# Patient Record
Sex: Female | Born: 1935
Health system: Southern US, Community
[De-identification: ages and names within clinical notes are randomized; demographics above are authoritative.]

## PROBLEM LIST (undated history)

## (undated) DIAGNOSIS — E039 Hypothyroidism, unspecified: Secondary | ICD-10-CM

## (undated) DIAGNOSIS — K759 Inflammatory liver disease, unspecified: Secondary | ICD-10-CM

## (undated) DIAGNOSIS — F419 Anxiety disorder, unspecified: Secondary | ICD-10-CM

## (undated) DIAGNOSIS — I428 Other cardiomyopathies: Secondary | ICD-10-CM

## (undated) DIAGNOSIS — R112 Nausea with vomiting, unspecified: Secondary | ICD-10-CM

## (undated) DIAGNOSIS — I10 Essential (primary) hypertension: Secondary | ICD-10-CM

## (undated) DIAGNOSIS — D649 Anemia, unspecified: Secondary | ICD-10-CM

## (undated) DIAGNOSIS — I509 Heart failure, unspecified: Secondary | ICD-10-CM

## (undated) DIAGNOSIS — I2510 Atherosclerotic heart disease of native coronary artery without angina pectoris: Secondary | ICD-10-CM

## (undated) DIAGNOSIS — I739 Peripheral vascular disease, unspecified: Secondary | ICD-10-CM

## (undated) DIAGNOSIS — M1990 Unspecified osteoarthritis, unspecified site: Secondary | ICD-10-CM

## (undated) DIAGNOSIS — R0602 Shortness of breath: Secondary | ICD-10-CM

## (undated) DIAGNOSIS — T4145XA Adverse effect of unspecified anesthetic, initial encounter: Secondary | ICD-10-CM

## (undated) DIAGNOSIS — G709 Myoneural disorder, unspecified: Secondary | ICD-10-CM

## (undated) DIAGNOSIS — K219 Gastro-esophageal reflux disease without esophagitis: Secondary | ICD-10-CM

## (undated) DIAGNOSIS — Z9889 Other specified postprocedural states: Secondary | ICD-10-CM

## (undated) DIAGNOSIS — E119 Type 2 diabetes mellitus without complications: Secondary | ICD-10-CM

## (undated) DIAGNOSIS — L0390 Cellulitis, unspecified: Secondary | ICD-10-CM

## (undated) DIAGNOSIS — T8859XA Other complications of anesthesia, initial encounter: Secondary | ICD-10-CM

## (undated) HISTORY — PX: TONSILLECTOMY: SUR1361

## (undated) HISTORY — PX: ABDOMINAL HYSTERECTOMY: SHX81

## (undated) HISTORY — PX: CHOLECYSTECTOMY: SHX55

## (undated) HISTORY — DX: Atherosclerotic heart disease of native coronary artery without angina pectoris: I25.10

## (undated) HISTORY — PX: THYROID SURGERY: SHX805

---

## 2001-10-26 ENCOUNTER — Inpatient Hospital Stay (HOSPITAL_COMMUNITY): Admission: AD | Admit: 2001-10-26 | Discharge: 2001-10-28 | Payer: Self-pay | Admitting: Geriatric Medicine

## 2002-08-07 ENCOUNTER — Other Ambulatory Visit: Admission: RE | Admit: 2002-08-07 | Discharge: 2002-08-07 | Payer: Self-pay | Admitting: Diagnostic Radiology

## 2003-07-27 ENCOUNTER — Emergency Department (HOSPITAL_COMMUNITY): Admission: EM | Admit: 2003-07-27 | Discharge: 2003-07-27 | Payer: Self-pay | Admitting: Emergency Medicine

## 2005-01-31 ENCOUNTER — Encounter: Admission: RE | Admit: 2005-01-31 | Discharge: 2005-01-31 | Payer: Self-pay | Admitting: Internal Medicine

## 2005-02-15 ENCOUNTER — Encounter: Admission: RE | Admit: 2005-02-15 | Discharge: 2005-02-15 | Payer: Self-pay | Admitting: Internal Medicine

## 2005-04-10 ENCOUNTER — Encounter: Admission: RE | Admit: 2005-04-10 | Discharge: 2005-04-10 | Payer: Self-pay | Admitting: Internal Medicine

## 2005-04-13 ENCOUNTER — Encounter: Admission: RE | Admit: 2005-04-13 | Discharge: 2005-04-13 | Payer: Self-pay | Admitting: Internal Medicine

## 2005-04-18 ENCOUNTER — Encounter: Admission: RE | Admit: 2005-04-18 | Discharge: 2005-04-18 | Payer: Self-pay | Admitting: Internal Medicine

## 2005-10-07 ENCOUNTER — Inpatient Hospital Stay (HOSPITAL_COMMUNITY): Admission: EM | Admit: 2005-10-07 | Discharge: 2005-10-11 | Payer: Self-pay | Admitting: Emergency Medicine

## 2005-10-10 ENCOUNTER — Encounter (INDEPENDENT_AMBULATORY_CARE_PROVIDER_SITE_OTHER): Payer: Self-pay | Admitting: *Deleted

## 2006-05-16 ENCOUNTER — Encounter: Admission: RE | Admit: 2006-05-16 | Discharge: 2006-05-16 | Payer: Self-pay | Admitting: Internal Medicine

## 2006-07-27 ENCOUNTER — Encounter: Admission: RE | Admit: 2006-07-27 | Discharge: 2006-07-27 | Payer: Self-pay | Admitting: Orthopedic Surgery

## 2006-08-13 ENCOUNTER — Encounter: Admission: RE | Admit: 2006-08-13 | Discharge: 2006-08-13 | Payer: Self-pay | Admitting: *Deleted

## 2006-08-14 ENCOUNTER — Encounter: Admission: RE | Admit: 2006-08-14 | Discharge: 2006-08-14 | Payer: Self-pay | Admitting: Orthopedic Surgery

## 2006-11-05 ENCOUNTER — Ambulatory Visit: Payer: Self-pay | Admitting: Vascular Surgery

## 2006-11-05 ENCOUNTER — Encounter: Payer: Self-pay | Admitting: Vascular Surgery

## 2006-11-05 ENCOUNTER — Ambulatory Visit (HOSPITAL_COMMUNITY): Admission: RE | Admit: 2006-11-05 | Discharge: 2006-11-05 | Payer: Self-pay | Admitting: *Deleted

## 2006-12-12 ENCOUNTER — Ambulatory Visit: Payer: Self-pay | Admitting: Vascular Surgery

## 2007-05-23 ENCOUNTER — Encounter: Admission: RE | Admit: 2007-05-23 | Discharge: 2007-05-23 | Payer: Self-pay | Admitting: Orthopedic Surgery

## 2007-09-01 ENCOUNTER — Encounter: Admission: RE | Admit: 2007-09-01 | Discharge: 2007-09-01 | Payer: Self-pay | Admitting: Orthopedic Surgery

## 2007-09-25 ENCOUNTER — Ambulatory Visit: Payer: Self-pay | Admitting: Vascular Surgery

## 2007-11-06 ENCOUNTER — Ambulatory Visit: Payer: Self-pay | Admitting: Vascular Surgery

## 2007-11-13 ENCOUNTER — Ambulatory Visit: Payer: Self-pay | Admitting: Vascular Surgery

## 2007-12-04 ENCOUNTER — Ambulatory Visit: Payer: Self-pay | Admitting: Vascular Surgery

## 2007-12-11 ENCOUNTER — Ambulatory Visit: Payer: Self-pay | Admitting: Vascular Surgery

## 2008-01-22 ENCOUNTER — Ambulatory Visit: Payer: Self-pay | Admitting: Vascular Surgery

## 2009-06-22 ENCOUNTER — Encounter: Admission: RE | Admit: 2009-06-22 | Discharge: 2009-06-22 | Payer: Self-pay | Admitting: Orthopedic Surgery

## 2009-11-24 ENCOUNTER — Encounter: Admission: RE | Admit: 2009-11-24 | Discharge: 2009-11-24 | Payer: Self-pay | Admitting: Orthopedic Surgery

## 2009-12-24 ENCOUNTER — Ambulatory Visit: Payer: Self-pay | Admitting: Vascular Surgery

## 2010-11-29 NOTE — Procedures (Signed)
DUPLEX DEEP VENOUS EXAM - LOWER EXTREMITY   INDICATION:  One week status post laser ablation and stab phlebectomies  of the right lower extremity.   HISTORY:  Edema:  No.  Trauma/Surgery:  Right lower extremity laser ablation.  Pain:  Yes.  PE:  No.  Previous DVT:  No.  Anticoagulants:  No.  Other:   DUPLEX EXAM:                CFV   SFV   PopV  PTV    GSV                R  L  R  L  R  L  R   L  R  L  Thrombosis    o  o  o     o     o      +  Spontaneous   +  +  +     +     +      0  Phasic        +  +  +     +     +      0  Augmentation  +  +  +     +     +      0  Compressible  +  +  +     +     +      0  Competent   Legend:  + - yes  o - no  p - partial  D - decreased    IMPRESSION:  1. No evidence of deep venous thrombosis noted in the right lower      extremity.  2. Totally occluded right greater saphenous extending from the distal      thigh to the lateral branch of the greater saphenous vein at the      groin level.  3. Partially occluded varicose veins of the right anterolateral thigh.      _____________________________  Larina Earthly, M.D.   CH/MEDQ  D:  12/11/2007  T:  12/11/2007  Job:  [id number]

## 2010-11-29 NOTE — Assessment & Plan Note (Signed)
OFFICE VISIT   [name], [name]  DOB:  [birth year]                                       01/22/2008  WUJWJ#:[id number]   The patient presents today for followup of a bilateral staged laser  ablation of her great saphenous vein.  She does report that she does  continue to have some aching discomfort, but feels that there has been  some improvement.  She does continue to have some excoriation over her  left lateral ankle, and this appears to be related to some degree to  itching.  She was given a prescription for a refill of her Silvadene,  which has helped in the past.  I have also suggested that, if she goes  for another 1 to 2 months with no healing, she may need dermatologic  consultation to assure that this is not some other etiology causing  this.  I am pleased with her overall result, as is this patient, and she  will see [place] again on an as needed basis.   Larina Earthly, M.D.  Electronically Signed   TFE/MEDQ  D:  01/22/2008  T:  01/23/2008  Job:  [id number]

## 2010-11-29 NOTE — Consult Note (Signed)
NEW PATIENT CONSULTATION   TONGA, PROUT  DOB:  11-20-1931                                       09/25/2007  ZOXWR#:[id number]   [name] presents today for continued followup of her severe venous  hypertension, left greater than right.  She has had these venous  varicosities for many years.  She reports that these have now become  progressively uncomfortable to her and she is developing marked skin  changes around her left ankle of venous hypertension.  She does not have  any history of hemorrhage.  She does have history of prior ulceration  over these areas in her left ankle.  She does have history of  superficial thrombophlebitis and reports chronic aching and cramping in  her left leg.  She has similar symptoms in the right leg but no skin  changes and not as progressed.  She has worn graduated compression  stockings since Spring of 2008 with no relief, elevates her legs when  possible.   PAST MEDICAL HISTORY:  Significant for:  1. Type 2 diabetes.  2. Goiter.  3. Hyperlipidemia.  4. Hypertension.   She currently is being treated with a stress fracture of her left ankle  with corrective boot.  She had undergone formal duplex over her lower  extremities in our office in May of 2008.  She had reflux in her  saphenous vein bilaterally.  I reimaged her vein today with Duplex and  this does show continued gross reflux in her left great saphenous vein.  Discussed options with [name].  I do feel that she is having  increasingly severe problems because of her venous insufficiency and I  have recommended that she proceed with laser ablation of her saphenous  vein and stab phlebectomy of her tributary varicosities in the left leg.  She understands the procedure and we will proceed as soon as she has had  completion of her treatment for her left ankle stress fracture.  We  presume this will be in early April.   Larina Earthly, M.D.  Electronically Signed   TFE/MEDQ  D:  09/25/2007  T:  09/26/2007  Job:  [id number]   cc:   Dorisann Frames, M.D.  Burnard Bunting, M.D.

## 2010-11-29 NOTE — Assessment & Plan Note (Signed)
OFFICE VISIT   TACI, STERLING  DOB:  03/02/1932                                       12/11/2007  ZOXWR#:[id number]   The patient presents today for a 1-week followup of her right leg laser  ablation and stab phlebectomy.  She did well with the procedure.  She  does report some atypical soreness in her right thigh and recalls that  this is more significant than it was on her left leg when it was treated  1 month ago.  She does report a significant generalized improvement in  her overall feeling and was questioning whether this was related to  improved circulation.  I explained that there should be no physiologic  reason why she would have a better overall general feel, that we would  anticipate improved sensation in her legs only.  Despite this, she  reports that she generally feels better than she has in the past and I  am quite pleased about this.  She underwent venous duplex today and this  reveals closure of her saphenous vein, up to just below her  saphenofemoral junction, with no evidence of deep venous thrombosis.  I  am quite pleased with her initial result and we will see her again in 6  weeks for final followup.   Larina Earthly, M.D.  Electronically Signed   TFE/MEDQ  D:  12/11/2007  T:  12/12/2007  Job:  [id number]

## 2010-11-29 NOTE — Assessment & Plan Note (Signed)
OFFICE VISIT   [name], [name]  DOB:  Apr 01, 1932                                       12/24/2009  HYQMV#:[id number]   The patient presents today for evaluation of lower extremity discomfort.  She is well-known to me from a prior bilateral staged ablation of her  great saphenous vein and stab phlebectomy for multiple tributary  varicosities.  She has continued to do quite well from the standpoint of  her venous pathology with no recurrent disease.  Her main complaint now  is multiple times as much as 10 times per night being awakened with  cramping in her calves and thighs.  She has tried all the home remedies  and also quinine with no relief.  She does have some degenerative disk  disease as well.  She denies any true arterial claudication or rest pain  in her feet.  She has not had any history of tissue loss.   Review of systems is negative other than discomfort.  She does not have  any cardiac disease.  Does have history of noninsulin dependent  diabetes.   She is married.  Does not smoke or drink alcohol.  She is retired.   PHYSICAL EXAM:  A well-developed, well-nourished white female appearing  her stated age.  Blood pressure is 171/79, pulse 74, respirations 18.  Her temperature is 97.8.  She is in no acute distress.  HEENT:  Normal.  Her radial and dorsalis pedis pulses are 2+ bilaterally.  She does have  diminished posterior and tibial pulses bilaterally.  Musculoskeletal  shows no major deformities or cyanosis.  Neurologic no focal weakness,  paresthesias.  Skin without ulcers or rashes.   She underwent noninvasive vascular laboratory studies in our office.  We  reviewed this.  This reveals normal ankle arm indices bilaterally and  normal triphasic waveforms bilaterally.  I reassured the patient of  this.  I explained that I do not see any evidence of arterial or venous  pathology to explain her discomfort.  She was reassured and will see [place]  again on an as-needed basis.     Larina Earthly, M.D.  Electronically Signed   TFE/MEDQ  D:  12/24/2009  T:  12/24/2009  Job:  [id number]   cc:   G. Dorene Grebe, M.D.  Dorisann Frames, M.D.

## 2010-11-29 NOTE — Consult Note (Signed)
VASCULAR SURGERY CONSULTATION   [name], [name]  DOB:  12/16/1931                                       12/12/2006  [id number]   I saw [name] in the office today in consultation concerning her  varicose veins and chronic venous insufficiency.  She was referred by  Dr. Henderson Cloud.  This is a pleasant 77 year old woman who has had varicose  veins in both lower extremities for as long as she can remember.  She  had developed some discoloration along the medial aspect of her left leg  and given her significant varicosities, she was referred for vascular  consultation.  Of note, she has had no previous history of DVT or  phlebitis that she is aware of.  She has had no claudication, rest pain,  or nonhealing ulcers.  She does get cramps in her legs at times.  Her  only complaint has really been some pain that is associated with  standing and relieved with ambulation and some mild bilateral lower  extremity swelling.   PAST MEDICAL HISTORY:  1. Adult onset diabetes.  She does not require insulin.  2. Hypercholesterolemia.  3. She denies any history of hypertension, history of previous      myocardial infarction, history of congestive heart failure, or      history of emphysema.   FAMILY HISTORY:  There is no family history of premature cardiovascular  disease.  She is not sure if her parents had problems with varicose  veins.   SOCIAL HISTORY:  She is married and has three children.  She does not  use tobacco.   ALLERGIES:  SULFA.   MEDICATIONS:  Documented on the medical history form in her chart.   REVIEW OF SYSTEMS:  Documented on the medical history form in her chart.   PHYSICAL EXAMINATION:  VITAL SIGNS:  Blood pressure 180/96, heart rate  82.  NECK:  I did not detect any carotid bruits.  LUNGS:  Clear to auscultation bilaterally.  CARDIOVASCULAR:  She has a regular rate and rhythm.  ABDOMEN:  Soft and nontender.  VASCULAR:  She has palpable femoral  pulses and palpable pedal pulses  bilaterally.  She has significant truncal varicosities bilaterally.  She  also has significant spider veins bilaterally.  EXTREMITIES:  She has mild bilateral lower extremity swelling.   She did undergo an extensive reflux venous Duplex scan in her office  today.  This shows confidence of the deep system bilaterally.  She is  noted to have incompetence of the saphenofemoral junction bilaterally  and the saphenous veins bilaterally.  In addition, there is chronic clot  in the distal greater saphenous vein on the left side.   Currently her only symptoms are some occasional pain associated with  standing.  I have discussed her diagnosis of chronic venous  insufficiency and superficial thrombophlebitis with her.  I have  recommended that she avoid prolonged standing and she can do as much  walking as she would like as this seems to help her symptoms.  We  discussed the use of compression stockings with a mild gradient and I  have her prescription for this.  We also discussed the importance of  intermittent leg elevation.  She has no evidence of deep venous  thrombosis and at this point, I do not think she needs anticoagulation.  I  will be happy to see her back if any new problems arise.   Judeth Cornfield. Scot Dock, M.D.  Electronically Signed  CSD/MEDQ  D:  12/12/2006  T:  12/12/2006  Job:  [id number]

## 2010-11-29 NOTE — Assessment & Plan Note (Signed)
OFFICE VISIT   SEMA, STANGLER  DOB:  Nov 23, 1931                                       11/13/2007  OZHYQ#:[id number]   Here today for evaluation of followup from left leg laser ablation stab  phlebectomy.  She has done quite well since her procedure 1 week ago.  She had some cramping and soreness in her thigh the night of the  procedure and has had essentially no discomfort since then.  She is  being very compliant regarding her compression garments.  She underwent  noninvasive limited venous duplex by me today and this reveals closure  of her saphenous vein throughout the course of her thigh and wide  patency of her common femoral vein.  I am quite pleased with her initial  result.  She is having some open ulceration over her right ankle and  dorsum of her foot and we have switched her to Silvadene and she has  been given a prescription of this today.  She wished to proceed with  ablation of her right leg for similar relief and we will plan this at  her convenience.   Larina Earthly, M.D.  Electronically Signed   TFE/MEDQ  D:  11/13/2007  T:  11/14/2007  Job:  [id number]

## 2010-12-02 NOTE — Op Note (Signed)
[name], [name]                  ACCOUNT NO.:  [id number]   MEDICAL RECORD NO.:  [id number]          PATIENT TYPE:  INP   LOCATION:  5732                         FACILITY:  [facility]   PHYSICIAN:  Kathrin Penner, M.D.   DATE OF BIRTH:  1932-02-15   DATE OF PROCEDURE:  10/10/2005  DATE OF DISCHARGE:  10/11/2005                                 OPERATIVE REPORT   PREOPERATIVE DIAGNOSIS:  Biliary pancreatitis with chronic calculus  cholecystitis.   POSTOPERATIVE DIAGNOSIS:  Biliary pancreatitis with chronic calculus  cholecystitis.   PROCEDURE:  Laparoscopic cholecystectomy with intraoperative cholangiogram.   SURGEON:  Kathrin Penner, M.D.   ASSISTANT:  Leisa Lenz. Lissa Merlin, N.P.   ANESTHESIA:  General endotracheal.   INDICATION:  This patient is a 77 year old female presenting with  hyperamylasemia, hyperlipasemia, associated with severe epigastric and right  upper quadrant pain with nausea and vomiting.  She was evaluated and noted  to have cholelithiasis.  The patient also has a remote history of alcohol  use.  After her hyperamylasemia had resolved, she now comes to the operating  room for laparoscopic cholecystectomy and intraoperative cholangiogram after  the risks and potential benefits of surgery had been discussed.  All  questions were answered and consent obtained.   DESCRIPTION OF PROCEDURE:  Following the induction of satisfactory general  anesthesia, the patient was positioned supinely and the abdomen had been  routinely prepped and draped to be included in the sterile operative field.  Open laparoscopy was created at the umbilicus and insertion of a Hasson type  cannula with insufflation of the peritoneal cavity with 14 mmHg pressure.  The camera was inserted and visual exploration of the abdomen carried out.  There was some free fluid down in the pelvis.  The other pelvic organs,  however, did not appear to be abnormal.  The patient is status post total  abdominal  hysterectomy.  Liver edge is sharply smooth.  Anterior gastric  wall and duodenum was swept and appeared to be normal.  The patient had  multiple adhesions to the wall of the gallbladder from the omentum.  Neither  the small or large intestine on view appeared to be abnormal.   Under direct vision, epigastric and lateral ports were placed.  The  gallbladder was grasped and retracted cephalad and multiple adhesions taken  down from off the gallbladder wall carrying the dissection down to the  ampulla of the gallbladder.  In the ampulla, the cystic duct and artery are  then dissected free.  The cystic duct is clipped proximally and opened, and  a cystic ductal angiogram was then carried out by passing a Cooke catheter  into the abdomen and inserting into the cystic duct.  Through this, a one-  half strength Hypaque dye is injected under the fluoroscopic guidance with a  resulting cholangiogram showing prompt flow of contrast into the duodenum  and up into the hepatic radicals.  There was a small defect at the junction  of the right and left hepatic ducts which I think is probably a bubble,  however, this needs to be  further confirmed with radiologic consultant.  The  caliber of ducts were entirely normal.   The cystic duct catheter was then removed.  The cystic duct was triply  clipped and transected and the cystic artery then isolated, doubly clipped,  and transected.  The gallbladder was then dissected free from the liver bed  using electrocautery and maintaining hemostasis throughout the course of  dissection.  At the end of this dissection, the gallbladder was placed in an  endo pouch and the right upper quadrant and the liver bed were then  thoroughly irrigated with normal saline in addition to bleeding points of  the liver bed then treated with electrocautery.  Following irrigation and  aspiration, the gallbladder was retrieved through the umbilical port without  difficulty.  The  pneumoperitoneum was allowed to deflate after the trocars  were removed under direct vision.  Sponge, instruments, and sharp counts  were then verified and the incision was closed as follows.  The umbilical  wound was closed in two layers with 0-Vicryl and 4-0 Monocryl.  Epigastric  and lateral flank wound was closed with 4-0 Monocryl sutures.  All wounds  were reinforced with Steri-Strips, sterile dressing was applied, anesthetic  reversed, and patient removed from the operating room to the recovery room  in stable condition.  She tolerated the procedure well.      Kathrin Penner, M.D.  Electronically Signed     PB/MEDQ  D:  10/10/2005  T:  10/12/2005  Job:  [id number]

## 2010-12-02 NOTE — Consult Note (Signed)
[name], [name]                  ACCOUNT NO.:  [id number]   MEDICAL RECORD NO.:  [id number]          PATIENT TYPE:  INP   LOCATION:  5731                         FACILITY:  [facility]   PHYSICIAN:  Lebron Conners, M.D.   DATE OF BIRTH:  1932/05/03   DATE OF CONSULTATION:  10/08/2005  DATE OF DISCHARGE:                                   CONSULTATION   CHIEF COMPLAINT:  Pancreatitis.   HISTORY OF PRESENT ILLNESS:  Patient is a 77 year old white female with  admission to the hospital for abdominal pain and marked elevation of the  amylase and lipase consistent with acute pancreatitis.  She does not consume  alcohol.  Her abdominal ultrasound shows sludge in the gallbladder without  evidence of acute cholecystitis and there is no dilatation of the bile  ducts.  Her liver tests are normal.  She has been admitted by Kela Millin, M.D., for supportive care and we are seeing her for consideration of  cholecystectomy.  She has had intermittent right upper quadrant abdominal  pain in the past.  Her medical physician, Dr. Cheryll Cockayne, had suggested that it  might be gallstones but had never recommended cholecystectomy or evaluation  of her gallbladder.  She is comfortable at this time.   PAST MEDICAL HISTORY:  1.  She has type 2 diabetes.  2.  She also has hypertension.  She is followed by Dr. Talmage Nap.  3.  She has had a thyroidectomy for uncertain indication.  4.  She has had a hysterectomy.  5.  Tonsillectomy.   ALLERGIES:  She is allergic to SULFA DRUGS.   She does not smoke.  She rarely drinks alcoholic beverages.   MEDICATIONS:  1.  Paroxetine 20 mg daily.  2.  Altace 10 mg daily.  3.  Actos 30 mg daily.  4.  Prevacid 30 mg p.r.n. for reflux type symptoms.   REVIEW OF SYSTEMS:  Denies chest pain, shortness of breath, fevers, sweats,  chills, nausea, vomiting and diarrhea.  Otherwise unremarkable.   PHYSICAL EXAMINATION:  VITAL SIGNS:  Unremarkable as recorded by nursing  staff.   Patient is comfortable, cheerful and is in no distress.  HEENT:  Unremarkable with no enlargement of the thyroid.  Well-healed collar  type incision.  CHEST:  Clear to auscultation.  No chest wall tenderness.  CARDIOVASCULAR:  Rate and rhythm normal.  No murmur or gallop.  Good  peripheral pulses.  ABDOMEN:  There is very slight epigastric tenderness.  There is no mass.  There is no distention.  EXTREMITIES:  No edema.  NEUROLOGIC:  Mental status is normal.   IMPRESSION:  Acute pancreatitis, most likely due to biliary disease,  improving.   PLAN:  Recheck amylase and lipase and liver tests in the morning.  If there  is good further improvement in the amylase value and in the clinical  picture, we could go ahead with laparoscopic cholecystectomy.      Lebron Conners, M.D.  Electronically Signed     WB/MEDQ  D:  10/08/2005  T:  10/10/2005  Job:  [id number]  cc:   Dorisann Frames, M.D.  Fax: [phone]

## 2010-12-02 NOTE — Discharge Summary (Signed)
NAMEMADYLYNN, [name]                  ACCOUNT NO.:  [id number]   MEDICAL RECORD NO.:  [id number]          PATIENT TYPE:  INP   LOCATION:  5732                         FACILITY:  [facility]   PHYSICIAN:  Corinna L. Conley Canal, MDDATE OF BIRTH:  1931-11-17   DATE OF ADMISSION:  10/07/2005  DATE OF DISCHARGE:                                 DISCHARGE SUMMARY   Audio too short to transcribe (less than 5 seconds)      Corinna L. Conley Canal, MD     CLS/MEDQ  D:  10/11/2005  T:  10/11/2005  Job:  [id number]

## 2010-12-02 NOTE — Discharge Summary (Signed)
[place]. [facility]  Patient:    [name], [name] Visit Number: [id number] MRN: [id number]          Service Type: MED Location: [id number] Attending Physician:  Mathews Argyle Dictated by:   Adriana Reams, M.D. Admit Date:  10/26/2001 Discharge Date: 10/28/2001                             Discharge Summary  DATE OF BIRTH:  07-30-1931  DISCHARGE DIAGNOSES: 1. Gastroenteritis syndrome.    a. Possible adverse reaction to Septra.    b. Possible viral gastroenteritis. 2. Diabetes mellitus. 3. Hypertension. 4. Slight elevation of aspartate transaminase (AST) and alanine transaminase    (ALT).  DISCHARGE MEDICATIONS: 1. Cozaar 25 mg p.o. q.d. 2. Glucotrol XL 10 mg p.o. q.d.  HISTORY OF PRESENT ILLNESS:  [name] is a 77 year old, white female patient of mine who was admitted over the weekend on Saturday for complaints of nausea, dehydration and abdominal pain.  She was started on some Septra DS antibiotic medication for presumed bronchitis on April 8, and started developing symptoms shortly thereafter of nausea and ability to tolerate solid foods.  She was seen at our walk-in clinic on Saturday morning and was about to be dehydrated with some slight elevation in her BUN level and was therefore admitted for for further evaluation.  HOSPITAL COURSE:  During her hospital stay, she received IV hydration, pain control with Demerol and nausea control with Phenergan.  Within 24 hours, the majority of her symptoms had subsided.  She did not report any episodes of emesis or increasing abdominal pain during her hospital course.  On the day of discharge, her vital signs were stable and her blood pressure was slightly elevated, but she was tolerating fluids well.  She was also started on Cozaar during her hospital stay for high blood pressure.  She will be able to continue on her Glucotrol as an outpatient and we will evaluate both of these upon  her followup with me in the office.  LABORATORY DATA AND X-RAY FINDINGS:  Sodium 139, potassium 3.4, chloride 104, CO2 28, BUN 15, creatinine 0.6, glucose 176.  AST 61, ALT 49, Alk phos 47, total bilirubin 0.5.  Both amylase and lipase levels on admission were within normal limits.  FOLLOWUP:  Her follow-up appointment will be with Dr. Barney Drain at [facility] on Thursday, April 17, at which time we will recheck her liver enzymes, a BMP and assess for any further symptoms.  We also need to reassess her blood pressure monitoring medication and continue with her diabetic medications. Dictated by:   Adriana Reams, M.D. Attending Physician:  Mathews Argyle DD:  10/28/01 TD:  10/29/01 Job: [id number]

## 2010-12-02 NOTE — Discharge Summary (Signed)
NAMEBRYTTNEY, [name]                  ACCOUNT NO.:  [id number]   MEDICAL RECORD NO.:  [id number]          PATIENT TYPE:  INP   LOCATION:  5732                         FACILITY:  [facility]   PHYSICIAN:  Corinna L. Lendell Caprice, MDDATE OF BIRTH:  [birth year]   DATE OF ADMISSION:  10/07/2005  DATE OF DISCHARGE:  10/11/2005                                 DISCHARGE SUMMARY   DISCHARGE DIAGNOSES:  1.  Gallstone pancreatitis.  2.  Status post laparoscopic cholecystectomy by Dr. Lurene Shadow on October 10, 2005.  3.  Diabetes.  4.  Hypertension.  5.  Hypokalemia  6.  Status post thyroid ablation.   DISCHARGE MEDICATIONS:  Same as upon admission plus Vicodin as needed.   ACTIVITY:  No heavy lifting for 2 weeks.   She is to follow up with Dr. Lurene Shadow in 2 weeks.   CONDITION:  Stable.   DIET:  Diabetic.   CONSULTATIONS:  Dr. Lebron Conners.   PERTINENT LABORATORY DATA:  Her cardiac enzymes were negative.  Triglycerides 365, HDL 25, LDL 72, total cholesterol 045.  Lipase on  admission was 3900, at discharge 46.  Her amylase on admission was 676 and  was normal at discharge.  Her complete metabolic panel was significant for a  potassium of 3.3 and an albumin of 2.7.  CBC unremarkable.   SPECIAL STUDIES AND RADIOLOGY:  Abdominal ultrasound showed distended  gallbladder with intraluminal sludge, no biliary dilatation, mild  hepatomegaly with fatty infiltration of the liver.  EKG showed normal sinus  rhythm with LVH by voltage, left axis deviation, prolonged QT.   HISTORY AND HOSPITAL COURSE:  Ms. [name] is a pleasant 77 year old white female  patient of Dr. Madison Hickman, who presented to the emergency room with  abdominal pain.  She had nausea.  Please see H&P for complete details.  She  had normal vital signs, right upper quadrant tenderness.  She was found to  have a pancreatitis, which  was felt to be biliary in origin.  She was made n.p.o. and started on pain  medications and IV fluids as well  as antiemetics.  Surgery was consulted and  did a laparoscopic cholecystectomy.  Her symptoms improved prior to this  procedure.  At the time of discharge, she is tolerating a regular diet and  has been cleared by surgery for discharge.      Corinna L. Lendell Caprice, MD  Electronically Signed     CLS/MEDQ  D:  10/11/2005  T:  10/12/2005  Job:  [id number]   cc:   Dorisann Frames, M.D.  Fax: [phone]   Darius Bump, M.D.  Fax: [phone]   Leonie Man, M.D.  [address]  [address]  [place]  [place] [address]

## 2010-12-02 NOTE — H&P (Signed)
NAMECHASYA, HEO                  ACCOUNT NO.:  [id number]   MEDICAL RECORD NO.:  [id number]          PATIENT TYPE:  INP   LOCATION:  [id number]                         FACILITY:  [facility]   PHYSICIAN:  Jaylah Oats, M.D.DATE OF BIRTH:  01-Oct-1931   DATE OF ADMISSION:  10/07/2005  DATE OF DISCHARGE:                                HISTORY & PHYSICAL   PRIMARY CARE PHYSICIAN:  Dr. Verner Chol   CHIEF COMPLAINT:  Upper abdominal pain.   HISTORY OF PRESENT ILLNESS:  The patient is a 77 year old white female with  past medical history significant for diabetes mellitus, hypertension, and  hyperlipidemia who presents with complaints of upper abdominal pain x1 day.  She states that the pain just was all across her upper abdomen and she had  succeeded nausea but no vomiting and the pain was worsened by deep breaths  and she describes it as a tight feeling, 5/10 intensity. She denies  hematemesis, diarrhea, fevers, dysuria, hematochezia, and no melena. The  patient also denies chest pain. She admits to a nonproductive cough x1 day.   The patient was seen in the ER and her lab work revealed an elevated lipase  and abdominal ultrasound showed gallbladder sludge, no stones. She is  admitted to the [facility] service for further evaluation and  management.   PAST MEDICAL HISTORY:  As stated above.   MEDICATIONS:  1.  Actos.  2.  Altace.  3.  Paxil.  4.  Prevacid.   The patient does not know the dosages of her medications.   ALLERGIES:  SULFA.   SOCIAL HISTORY:  Occasional alcohol (she admits to drinking half a beer  prior to this presentation). Denies tobacco.   FAMILY HISTORY:  Positive for diabetes.   REVIEW OF SYSTEMS:  As per HPI. Other review of systems negative.   PHYSICAL EXAMINATION:  GENERAL:  The patient is an elderly white female,  looks younger than her stated age, in no apparent distress.  VITAL SIGNS:  Temperature is 98 with a blood pressure of 147/71, pulse  82,  respiratory rate 20, O2 saturation of 98%.  HEENT:  PERRL, EOMI, slightly dry mucous membranes. No oral exudates.  Sclerae anicteric.  NECK:  Supple, no adenopathy, no thyromegaly, no carotid bruits appreciated.  LUNGS:  Clear to auscultation bilaterally. No crackles or wheezes.  CARDIOVASCULAR:  Regular rate and rhythm. Normal S1, S2.  ABDOMEN:  Soft, bowel sounds present. She has right upper quadrant  tenderness, no rebound tenderness, no masses palpable, and no organomegaly.  EXTREMITIES:  No cyanosis or edema.  NEUROLOGIC:  Alert and oriented x3. Cranial nerves II-XII grossly intact.  Nonfocal exam.   LABORATORY DATA:  Her lipase is 3936. Sodium is 139, potassium 3.8, chloride  is 109, BUN 14, glucose of 170. Her pH is 7.38 with a pCO2 of 35, bicarb of  21.2. White cell count is 6.5 with a hemoglobin of 12.4, hematocrit of 36.1,  platelet count 271, with a neutrophil count of 16%. Creatinine is 0.5. Her  calcium is 8.2. Her LFTs are within normal limits.  ASSESSMENT AND PLAN:  1.  Pancreatitis - no significant alcohol use, gallbladder sludge noted per      ultrasound and as discussed above, the patient with right upper quadrant      tenderness on exam and markedly elevated lipase. Will obtain a      triglyceride level and follow. Will keep the patient n.p.o. IV      analgesics for pain management. IV fluids and IV Protonix. Will consult      surgery for possible cholecystectomy.  2.  Diabetes mellitus - will hold oral hypoglycemics for now, Accu-Cheks and      cover with sliding scale insulin.  3.  Hypertension - will continue Altace.  4.  History of hyperlipidemia. Since she is on no medications secondary to      intolerance, will obtain a fasting lipid profile and follow.      Shanik Oats, M.D.  Electronically Signed     ACV/MEDQ  D:  10/08/2005  T:  10/09/2005  Job:  [id number]   cc:   Leilani Merl, M.D.  Fax: [phone]   Jacelyn Pi, M.D.  Fax:  [phone]

## 2010-12-02 NOTE — H&P (Signed)
[place]  Patient:    [name], [name] Visit Number: [id number] MRN: [id number]          Service Type: MED Location: [id number] Attending Physician:  Ginette Otto Dictated by:   Hal T. Stoneking, M.D. Admit Date:  10/26/2001 Discharge Date: 10/28/2001   CC:         Lester Kinsman, M.D.   History and Physical  IDENTIFYING DATA:  The patient is a very nice 77 year old white female.  She has had diabetes and was diagnosed approximately in 1993.  She has also had a goiter and has been operated on twice in the past.  She has been hyperthyroid in the past, although has more recently been on PTU, and that has recently been discontinued.  She was in her usual state of health until four weeks ago. She developed a "cold."  She stated that she had head congestion and persistent cough.  She was given codeine about two weeks ago, and this made her too sleepy; so she stopped the medication.  Then approximately three days ago she was started on Septra, then developed dizziness, was again feeling sleepy, was nauseated, and then began having dry heaves and epigastric pain. She stopped the Septra yesterday.  The epigastric pain continues, and she has not been able to keep fluids down.  She has had a temperature as high as 101 yesterday.  Her bowel movements have been normal.  She has seen no blood in her stool.  She has had no blood in any vomitus.  She did take ibuprofen once yesterday.  She does not consume alcohol.  She went to the walk-in clinic at [facility].  She was seen by Dr. Foy Guadalajara, and due to dehydration, persistent pain, it was felt she needed hospitalization, at least for IV fluids.  PAST MEDICAL HISTORY:  Remarkable for diabetes diagnosed in 1993.  History of a goiter.  History of hyperthyroidism.  No history of heart disease, stroke, or cancer.  ALLERGIES:  She has no known drug allergies.  CURRENT MEDICATIONS:  Glucotrol XL  10 mg a day, multiple vitamin once a day.  PREVIOUS SURGERIES:  She had a total abdominal hysterectomy and BSO in 1971. She has had two thyroid surgeries in the past.  FAMILY HISTORY:  Mother, brother, and two sisters have diabetes.  She lost a son to possibly a lymphoma at age [age].  She lost her father at age 64 to an MI.  SOCIAL HISTORY:  She moved to the [place] from [place], [place] in [place].  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  She also has retired from Engineering geologist work.  She does not drink.  She does not smoke.  She has three sons, ages 59, 82, and 51.  REVIEW OF SYSTEMS:  She complains of a dull headache.  No change in vision or hearing.  No trouble with chewing.  She does complain of discomfort on swallowing, especially in the epigastric area.  She has had no dysuria, no change in bowel habits.  PHYSICAL EXAMINATION:  VITAL SIGNS:  Temperature 98.3, blood pressure 118/64, Accu-Chek 305, respiratory rate 20.  SKIN:  Warm and dry.  HEENT:  Pupils are equal, round, and reactive to light.  Disks are sharp.  TMs are normal.  Oropharynx is dry.  NECK:  Does reveal a goiter.  LUNGS:  Clear.  HEART:  Regular rate and rhythm without murmur.  ABDOMEN:  Reveals epigastric discomfort.  Her  abdomen is soft.  No masses felt.  NEUROLOGIC:  She is alert and appropriate.  LABORATORY DATA:  Laboratory available at this time [facility] shows a hemoglobin of 15.3 and a white count of 8300.  Granulocytes are slightly elevated at 80.  ASSESSMENT: 1. Sudden nausea, vomiting, and epigastric pain, likely secondary to Septra.    I also consider gastritis, peptic ulcer disease, biliary disease, or    pancreatitis. 2. Dehydration. 3. Diabetes mellitus with elevated blood sugars secondary to stress and acute    illness. 4. Recent viral upper respiratory infection.  PLAN:  We will admit.  We will begin IV fluids.  We will cover her with Regular Insulin.  We will check a  liver panel, amylase, and lipase.  If her workup is negative and she feels better in the morning, she could possibly go home. Dictated by:   Hal T. Stoneking, M.D. Attending Physician:  Ginette Otto DD:  10/26/01 TD:  10/26/01 Job: [id number]

## 2011-03-15 ENCOUNTER — Other Ambulatory Visit: Payer: Self-pay | Admitting: Family Medicine

## 2011-03-15 DIAGNOSIS — R609 Edema, unspecified: Secondary | ICD-10-CM

## 2011-03-15 DIAGNOSIS — R1900 Intra-abdominal and pelvic swelling, mass and lump, unspecified site: Secondary | ICD-10-CM

## 2011-03-16 ENCOUNTER — Ambulatory Visit
Admission: RE | Admit: 2011-03-16 | Discharge: 2011-03-16 | Disposition: A | Discharge status: Home / Self Care | Payer: Medicare Other | Source: Ambulatory Visit | Attending: Family Medicine | Admitting: Family Medicine

## 2011-03-16 DIAGNOSIS — R609 Edema, unspecified: Secondary | ICD-10-CM

## 2011-03-16 DIAGNOSIS — R1900 Intra-abdominal and pelvic swelling, mass and lump, unspecified site: Secondary | ICD-10-CM

## 2011-05-31 ENCOUNTER — Other Ambulatory Visit (HOSPITAL_COMMUNITY): Payer: Self-pay | Admitting: Gastroenterology

## 2011-05-31 DIAGNOSIS — R1110 Vomiting, unspecified: Secondary | ICD-10-CM

## 2011-06-23 ENCOUNTER — Encounter (HOSPITAL_COMMUNITY)
Admission: RE | Admit: 2011-06-23 | Discharge: 2011-06-23 | Disposition: A | Discharge status: Home / Self Care | Payer: Medicare Other | Source: Ambulatory Visit | Attending: Gastroenterology | Admitting: Gastroenterology

## 2011-06-23 DIAGNOSIS — R109 Unspecified abdominal pain: Secondary | ICD-10-CM | POA: Insufficient documentation

## 2011-06-23 DIAGNOSIS — R1110 Vomiting, unspecified: Secondary | ICD-10-CM | POA: Insufficient documentation

## 2011-06-23 MED ORDER — TECHNETIUM TC 99M SULFUR COLLOID: 2.1000 | Freq: Once | INTRAVENOUS | Status: AC | PRN

## 2011-08-09 HISTORY — PX: CORONARY ANGIOPLASTY WITH STENT PLACEMENT: SHX49

## 2011-09-06 ENCOUNTER — Other Ambulatory Visit (HOSPITAL_COMMUNITY): Payer: Self-pay | Admitting: Gastroenterology

## 2011-09-06 ENCOUNTER — Ambulatory Visit (HOSPITAL_COMMUNITY)
Admission: RE | Admit: 2011-09-06 | Discharge: 2011-09-06 | Disposition: A | Discharge status: Home / Self Care | Payer: Medicare Other | Source: Ambulatory Visit | Attending: Gastroenterology | Admitting: Gastroenterology

## 2011-09-06 ENCOUNTER — Other Ambulatory Visit: Payer: Self-pay | Admitting: Gastroenterology

## 2011-09-06 DIAGNOSIS — Q438 Other specified congenital malformations of intestine: Secondary | ICD-10-CM | POA: Insufficient documentation

## 2011-09-06 DIAGNOSIS — K5730 Diverticulosis of large intestine without perforation or abscess without bleeding: Secondary | ICD-10-CM | POA: Insufficient documentation

## 2012-08-05 ENCOUNTER — Encounter (HOSPITAL_COMMUNITY): Payer: Self-pay | Admitting: Pharmacy Technician

## 2012-08-06 ENCOUNTER — Other Ambulatory Visit: Payer: Self-pay | Admitting: Interventional Cardiology

## 2012-08-08 ENCOUNTER — Ambulatory Visit (HOSPITAL_COMMUNITY)
Admission: RE | Admit: 2012-08-08 | Discharge: 2012-08-09 | Disposition: A | Discharge status: Home / Self Care | Payer: Medicare Other | Source: Ambulatory Visit | Attending: Interventional Cardiology | Admitting: Interventional Cardiology

## 2012-08-08 ENCOUNTER — Encounter (HOSPITAL_COMMUNITY)
Admission: RE | Disposition: A | Discharge status: Home / Self Care | Payer: Self-pay | Source: Ambulatory Visit | Attending: Interventional Cardiology

## 2012-08-08 ENCOUNTER — Encounter (HOSPITAL_COMMUNITY): Payer: Self-pay | Admitting: General Practice

## 2012-08-08 DIAGNOSIS — E119 Type 2 diabetes mellitus without complications: Secondary | ICD-10-CM

## 2012-08-08 DIAGNOSIS — I2582 Chronic total occlusion of coronary artery: Secondary | ICD-10-CM | POA: Insufficient documentation

## 2012-08-08 DIAGNOSIS — I2510 Atherosclerotic heart disease of native coronary artery without angina pectoris: Secondary | ICD-10-CM

## 2012-08-08 DIAGNOSIS — Z955 Presence of coronary angioplasty implant and graft: Secondary | ICD-10-CM

## 2012-08-08 DIAGNOSIS — I209 Angina pectoris, unspecified: Secondary | ICD-10-CM | POA: Insufficient documentation

## 2012-08-08 DIAGNOSIS — R9389 Abnormal findings on diagnostic imaging of other specified body structures: Secondary | ICD-10-CM | POA: Insufficient documentation

## 2012-08-08 DIAGNOSIS — I428 Other cardiomyopathies: Secondary | ICD-10-CM

## 2012-08-08 HISTORY — DX: Type 2 diabetes mellitus without complications: E11.9

## 2012-08-08 HISTORY — PX: LEFT HEART CATHETERIZATION WITH CORONARY ANGIOGRAM: SHX5451

## 2012-08-08 HISTORY — DX: Myoneural disorder, unspecified: G70.9

## 2012-08-08 HISTORY — DX: Gastro-esophageal reflux disease without esophagitis: K21.9

## 2012-08-08 HISTORY — DX: Essential (primary) hypertension: I10

## 2012-08-08 HISTORY — DX: Other cardiomyopathies: I42.8

## 2012-08-08 HISTORY — DX: Anxiety disorder, unspecified: F41.9

## 2012-08-08 HISTORY — DX: Other specified postprocedural states: Z98.890

## 2012-08-08 HISTORY — DX: Hypothyroidism, unspecified: E03.9

## 2012-08-08 HISTORY — DX: Other complications of anesthesia, initial encounter: T88.59XA

## 2012-08-08 HISTORY — DX: Atherosclerotic heart disease of native coronary artery without angina pectoris: I25.10

## 2012-08-08 HISTORY — DX: Shortness of breath: R06.02

## 2012-08-08 HISTORY — DX: Nausea with vomiting, unspecified: R11.2

## 2012-08-08 HISTORY — DX: Adverse effect of unspecified anesthetic, initial encounter: T41.45XA

## 2012-08-08 LAB — GLUCOSE, CAPILLARY
Glucose-Capillary: 198 mg/dL — ABNORMAL HIGH (ref 70–99)
Glucose-Capillary: 166 mg/dL — ABNORMAL HIGH (ref 70–99)
Glucose-Capillary: 230 mg/dL — ABNORMAL HIGH (ref 70–99)
Glucose-Capillary: 149 mg/dL — ABNORMAL HIGH (ref 70–99)
Glucose-Capillary: 162 mg/dL — ABNORMAL HIGH (ref 70–99)

## 2012-08-08 LAB — POCT ACTIVATED CLOTTING TIME: Activated Clotting Time: 432 seconds

## 2012-08-08 SURGERY — LEFT HEART CATHETERIZATION WITH CORONARY ANGIOGRAM
Anesthesia: LOCAL

## 2012-08-08 MED ORDER — SODIUM CHLORIDE 0.9 % IV SOLN
0.2500 mg/kg/h | INTRAVENOUS | Status: AC
  Filled 2012-08-08: qty 250

## 2012-08-08 MED ORDER — MIDAZOLAM HCL 2 MG/2ML IJ SOLN
INTRAMUSCULAR | Status: AC
  Filled 2012-08-08: qty 2

## 2012-08-08 MED ORDER — SODIUM CHLORIDE 0.9 % IJ SOLN: 3.0000 mL | Freq: Two times a day (BID) | INTRAMUSCULAR | Status: DC

## 2012-08-08 MED ORDER — ASPIRIN EC 325 MG PO TBEC
325.0000 mg | DELAYED_RELEASE_TABLET | Freq: Every day | ORAL | Status: DC
  Administered 2012-08-09: 325 mg via ORAL
  Filled 2012-08-08: qty 1

## 2012-08-08 MED ORDER — NITROGLYCERIN 0.2 MG/ML ON CALL CATH LAB
INTRAVENOUS | Status: AC
  Filled 2012-08-08: qty 1

## 2012-08-08 MED ORDER — LIDOCAINE HCL (PF) 1 % IJ SOLN
INTRAMUSCULAR | Status: AC
  Filled 2012-08-08: qty 30

## 2012-08-08 MED ORDER — DIAZEPAM 5 MG PO TABS
5.0000 mg | ORAL_TABLET | ORAL | Status: AC
  Administered 2012-08-08: 5 mg via ORAL

## 2012-08-08 MED ORDER — ASPIRIN 81 MG PO CHEW
CHEWABLE_TABLET | ORAL | Status: AC
  Filled 2012-08-08: qty 4

## 2012-08-08 MED ORDER — SODIUM CHLORIDE 0.9 % IV SOLN
0.2500 mg/kg/h | INTRAVENOUS | Status: AC
  Administered 2012-08-08: 0.25 mg/kg/h via INTRAVENOUS
  Filled 2012-08-08: qty 250

## 2012-08-08 MED ORDER — FENTANYL CITRATE 0.05 MG/ML IJ SOLN
INTRAMUSCULAR | Status: AC
  Filled 2012-08-08: qty 2

## 2012-08-08 MED ORDER — OMEGA-3 FATTY ACIDS 1000 MG PO CAPS: 2.0000 g | ORAL_CAPSULE | Freq: Every day | ORAL | Status: DC

## 2012-08-08 MED ORDER — PAROXETINE HCL 20 MG PO TABS
20.0000 mg | ORAL_TABLET | ORAL | Status: DC
  Administered 2012-08-09 (×2): 20 mg via ORAL
  Filled 2012-08-08 (×2): qty 1

## 2012-08-08 MED ORDER — SODIUM CHLORIDE 0.9 % IV SOLN: 250.0000 mL | INTRAVENOUS | Status: DC | PRN

## 2012-08-08 MED ORDER — SODIUM CHLORIDE 0.9 % IV SOLN
1.0000 mL/kg/h | INTRAVENOUS | Status: AC
  Administered 2012-08-08: 1 mL/kg/h via INTRAVENOUS

## 2012-08-08 MED ORDER — ASPIRIN 81 MG PO CHEW
324.0000 mg | CHEWABLE_TABLET | ORAL | Status: AC
  Administered 2012-08-08: 324 mg via ORAL

## 2012-08-08 MED ORDER — ASPIRIN EC 325 MG PO TBEC: 325.0000 mg | DELAYED_RELEASE_TABLET | Freq: Every day | ORAL | Status: DC

## 2012-08-08 MED ORDER — CLOPIDOGREL BISULFATE 75 MG PO TABS
75.0000 mg | ORAL_TABLET | Freq: Every day | ORAL | Status: DC
  Administered 2012-08-09: 75 mg via ORAL
  Filled 2012-08-08: qty 1

## 2012-08-08 MED ORDER — SODIUM CHLORIDE 0.9 % IV SOLN
INTRAVENOUS | Status: DC
  Administered 2012-08-08: 06:00:00 via INTRAVENOUS

## 2012-08-08 MED ORDER — DIAZEPAM 5 MG PO TABS
ORAL_TABLET | ORAL | Status: AC
  Filled 2012-08-08: qty 1

## 2012-08-08 MED ORDER — HEPARIN (PORCINE) IN NACL 2-0.9 UNIT/ML-% IJ SOLN
INTRAMUSCULAR | Status: AC
  Filled 2012-08-08: qty 1000

## 2012-08-08 MED ORDER — ACETAMINOPHEN 325 MG PO TABS: 650.0000 mg | ORAL_TABLET | ORAL | Status: DC | PRN

## 2012-08-08 MED ORDER — OMEGA-3-ACID ETHYL ESTERS 1 G PO CAPS
2.0000 g | ORAL_CAPSULE | Freq: Every day | ORAL | Status: DC
  Administered 2012-08-08: 2 g via ORAL
  Filled 2012-08-08 (×2): qty 2

## 2012-08-08 MED ORDER — ATORVASTATIN CALCIUM 20 MG PO TABS
20.0000 mg | ORAL_TABLET | Freq: Every day | ORAL | Status: DC
  Administered 2012-08-08 – 2012-08-09 (×2): 20 mg via ORAL
  Filled 2012-08-08 (×2): qty 1

## 2012-08-08 MED ORDER — CLOPIDOGREL BISULFATE 300 MG PO TABS
ORAL_TABLET | ORAL | Status: AC
  Filled 2012-08-08: qty 2

## 2012-08-08 MED ORDER — SODIUM CHLORIDE 0.9 % IJ SOLN: 3.0000 mL | INTRAMUSCULAR | Status: DC | PRN

## 2012-08-08 MED ORDER — LISINOPRIL 20 MG PO TABS
20.0000 mg | ORAL_TABLET | Freq: Every day | ORAL | Status: DC
  Administered 2012-08-08 – 2012-08-09 (×2): 20 mg via ORAL
  Filled 2012-08-08 (×2): qty 1

## 2012-08-08 MED ORDER — INSULIN DETEMIR 100 UNIT/ML ~~LOC~~ SOLN
20.0000 [IU] | Freq: Every day | SUBCUTANEOUS | Status: DC
  Administered 2012-08-08: 20 [IU] via SUBCUTANEOUS
  Filled 2012-08-08: qty 10

## 2012-08-08 MED ORDER — BIVALIRUDIN 250 MG IV SOLR
INTRAVENOUS | Status: AC
  Filled 2012-08-08: qty 250

## 2012-08-08 MED ORDER — ONDANSETRON HCL 4 MG/2ML IJ SOLN: 4.0000 mg | Freq: Four times a day (QID) | INTRAMUSCULAR | Status: DC | PRN

## 2012-08-08 MED ORDER — PANTOPRAZOLE SODIUM 40 MG PO TBEC
40.0000 mg | DELAYED_RELEASE_TABLET | Freq: Every day | ORAL | Status: DC
  Administered 2012-08-09 (×2): 40 mg via ORAL
  Filled 2012-08-08 (×2): qty 1

## 2012-08-08 NOTE — H&P (Signed)
  Date of Initial H&P: 08/05/12  History reviewed, patient examined, no change in status, stable for surgery.

## 2012-08-08 NOTE — CV Procedure (Signed)
PROCEDURE:  Left heart catheterization with selective coronary angiography, left ventriculogram. PCI mid left circumflex. PCI OM2.  INDICATIONS:  Abnormal echocardiogram revealing moderately decreased LV function, class III angina  The risks, benefits, and details of the procedure were explained to the patient.  The patient verbalized understanding and wanted to proceed.  Informed written consent was obtained.  PROCEDURE TECHNIQUE:  After Xylocaine anesthesia a 41F sheath was placed in the right radial artery with a single anterior needle wall stick.   Left coronary angiography was done using a Judkins L3.5 guide catheter.  Right coronary angiography was done using a Judkins R4 guide catheter.  Left ventriculography was done using a pigtail catheter.  Manipulating catheters from the right radial was somewhat difficult.  There was tortuosity in the right shoulder and some vasospasm noted below the elbow.   CONTRAST:  Total of 275 cc.  COMPLICATIONS:  None.    HEMODYNAMICS:  Aortic pressure was 113/53; LV pressure was 114/3; LVEDP 6.  There was no gradient between the left ventricle and aorta.    ANGIOGRAPHIC DATA:   The left main coronary artery is widely patent.  The left anterior descending artery is a large vessel which reaches the apex.  There are mild luminal irregularities.  The first diagonal is medium sized with mild irregularities.  The left circumflex artery is a large vessel.  In the mid vessel, there is a diffuse stenosis, 50-75% with a focal area before the OM2 of 75%.  The OM is a small vessel that is diffusely diseased, with disease up to 95%.  The remainder of the circumflex branch is small and widely patent.  The right coronary artery is occluded in the mid portion.  The distal RCA fills by left to right collaterals.  PCI NARRATIVE:   A CLS 3.0 guiding catheter was used to engage the left main.  Angiomax was used for anticoagulation.  An ACT was used to check that the Angiomax  was therapeutic.  A pro-water wire was placed into the circumflex down the OM 2.  A 2.0 x 20 predilatation balloon was then deployed several times up to 10 atmospheres.  We attempted to advance a 2.25 x 28 Promus drug-eluting stent.  This stent would not cross the area of disease in the mid circumflex.  A BMW wire was then placed into the OM 2.  The stent still would not cross into the OM 2.  A shorter stent was tried.  We attempted with a 2.25 x 15 expedition stent.  This also made it further into the circumflex but would not go down the OM 2.  It seemed that the stents were getting hung up on calcific disease in the mid circumflex before the origin of the OM 2.  We then ballooned the mid circumflex and tried again to advance the 15 mm stent into the OM 2.  This was still unsuccessful.  The stent did get past the disease in the mid circumflex after balloon dilatation but would not navigate down the OM 2.  Given that there was an excellent PTCA result in the OM 2, we turned our attention to the midcircumflex.  A 2.5 x 18 expedition drug-eluting stent was then deployed.  The proximal to mid area of the stent was postdilated with a 3.25 x 12 Wolford Trac balloon.  There is an excellent angiographic result with no residual stenosis.  TIMI-3 flow was maintained throughout.  Several doses of intracoronary nitroglycerin were given to treat vasospasm.  There was vasospasm noted in the radial sheath as well.  Several doses of intra-arterial nitroglycerin were administered in the sheath. The patient tolerated the procedure well.  A TR band was used for hemostasis.  LEFT VENTRICULOGRAM:  Left ventricular angiogram was done in the 30 RAO projection and revealed mild inferior hypokinesis and mildly decreased systolic function with an estimated ejection fraction of 40-45%.  LVEDP was 6 mmHg.  IMPRESSIONS:  1. Normal left main coronary artery. 2. Mild disease in the left anterior descending artery. 3. Significant disease in  the mid left circumflex artery and OM2.  The OM2 was treated with PTCA and a 2.5 x 18 drug eluting Expedition stent was placed in teh mid circumflex, postdilated to 3.3 mm, proximally. 4. Occluded mid right coronary artery with left to right collaterals. 5. Mildly decreased left ventricular systolic function.  LVEDP 6 mmHg.  Ejection fraction 40-45%.  RECOMMENDATION:  Dual antiplatelet therapy for at least a year. Continue aggressive secondary prevention.  She'll be hydrated aggressively.  She'll be watched overnight.

## 2012-08-08 NOTE — Progress Notes (Signed)
Utilization Review Completed Andrzej Scully J. Shanasia Ibrahim, RN, BSN, NCM [phone]  

## 2012-08-09 ENCOUNTER — Encounter (HOSPITAL_COMMUNITY): Payer: Self-pay | Admitting: Interventional Cardiology

## 2012-08-09 DIAGNOSIS — I2510 Atherosclerotic heart disease of native coronary artery without angina pectoris: Secondary | ICD-10-CM | POA: Insufficient documentation

## 2012-08-09 DIAGNOSIS — I428 Other cardiomyopathies: Secondary | ICD-10-CM | POA: Insufficient documentation

## 2012-08-09 DIAGNOSIS — E1151 Type 2 diabetes mellitus with diabetic peripheral angiopathy without gangrene: Secondary | ICD-10-CM | POA: Insufficient documentation

## 2012-08-09 DIAGNOSIS — I10 Essential (primary) hypertension: Secondary | ICD-10-CM | POA: Insufficient documentation

## 2012-08-09 DIAGNOSIS — E119 Type 2 diabetes mellitus without complications: Secondary | ICD-10-CM | POA: Insufficient documentation

## 2012-08-09 LAB — BASIC METABOLIC PANEL
BUN: 9 mg/dL (ref 6–23)
CO2: 26 mEq/L (ref 19–32)
Calcium: 9.1 mg/dL (ref 8.4–10.5)
Chloride: 104 mEq/L (ref 96–112)
Creatinine, Ser: 0.55 mg/dL (ref 0.50–1.10)
GFR calc Af Amer: >90 mL/min (ref >90)
GFR calc non Af Amer: 88 mL/min — ABNORMAL LOW (ref >90)
Glucose, Bld: 172 mg/dL — ABNORMAL HIGH (ref 70–99)
Potassium: 3.7 mEq/L (ref 3.5–5.1)
Sodium: 139 mEq/L (ref 135–145)

## 2012-08-09 LAB — CBC
HCT: 35.9 % — ABNORMAL LOW (ref 36.0–46.0)
Hemoglobin: 12 g/dL (ref 12.0–15.0)
MCH: 29.8 pg (ref 26.0–34.0)
MCHC: 33.4 g/dL (ref 30.0–36.0)
MCV: 89.1 fL (ref 78.0–100.0)
Platelets: 263 10*3/uL (ref 150–400)
RBC: 4.03 MIL/uL (ref 3.87–5.11)
RDW: 13.1 % (ref 11.5–15.5)
WBC: 7.4 10*3/uL (ref 4.0–10.5)

## 2012-08-09 LAB — GLUCOSE, CAPILLARY: Glucose-Capillary: 167 mg/dL — ABNORMAL HIGH (ref 70–99)

## 2012-08-09 MED ORDER — PANTOPRAZOLE SODIUM 40 MG PO TBEC: 40.0000 mg | DELAYED_RELEASE_TABLET | Freq: Every day | ORAL | Status: DC

## 2012-08-09 MED ORDER — GLYBURIDE-METFORMIN 2.5-500 MG PO TABS: 1.0000 | ORAL_TABLET | Freq: Two times a day (BID) | ORAL | Status: DC

## 2012-08-09 MED ORDER — CLOPIDOGREL BISULFATE 75 MG PO TABS: 75.0000 mg | ORAL_TABLET | Freq: Every day | ORAL | Status: DC

## 2012-08-09 MED FILL — Dextrose Inj 5%: INTRAVENOUS | Qty: 50 | Status: AC

## 2012-08-09 NOTE — Progress Notes (Signed)
CARDIAC REHAB PHASE I   PRE:  Rate/Rhythm: 76 SR    BP: sitting 151/60    SaO2:   MODE:  Ambulation: 600 ft   POST:  Rate/Rhythm: 108 ST    BP: sitting 164/80     SaO2:   Tolerated well. Feels much better. Good questions with ed. Interested in CRPII at [facility] Will send referral.  [phone]  Harriet Masson CES, ACSM

## 2012-08-09 NOTE — Discharge Summary (Addendum)
Patient ID: [name] MRN: [id number] DOB/AGE: [birth year] 79 y.o.  Admit date: 08/08/2012 Discharge date: 08/09/2012  Primary Discharge Diagnosis CAD Secondary Discharge Diagnosis Cardiomyopathy, class III angina, diabetes  Significant Diagnostic Studies: angiography: Cardiac cath with PTCA of OM2, and stent to mid circumflex with 2.5 x 18 drug eluting stent postdilated to 3.3 mm.  Chronic total occlusion of the RCA with brisk left to right collaterals.  Consults: None  Hospital Course: 79 y/o who had a cardiac cath due to abnormal echo, class III angina and had the above intervention performed.  She tolerated the procedure well.  She was watched overnight.  She had no further angina.  She walked with cardiac rehab and did not have a problem.  No right wrist complications at the access site.  She was deemed ready for discharge and was anxious to leave the hospital.  Plan for medical management for the chronically occluded RCA.  PPI changed due to starting Plavix.   Discharge Exam: Blood pressure 155/61, pulse 75, temperature 97.7 F (36.5 C), temperature source Oral, resp. rate 18, height 5' 6.5" (1.689 m), weight 86 kg (189 lb 9.5 oz), SpO2 98.00%.   [place]/AT RRR S1S2 CTA bilaterally 2+ right radial pulse No hematoma Labs:   Lab Results  Component Value Date   WBC 7.4 08/09/2012   HGB 12.0 08/09/2012   HCT 35.9* 08/09/2012   MCV 89.1 08/09/2012   PLT 263 08/09/2012    Lab 08/09/12 0415  NA 139  K 3.7  CL 104  CO2 26  BUN 9  CREATININE 0.55  CALCIUM 9.1  PROT --  BILITOT --  ALKPHOS --  ALT --  AST --  GLUCOSE 172*   No results found for this basename: CKTOTAL, CKMB, CKMBINDEX, TROPONINI    No results found for this basename: CHOL   No results found for this basename: HDL   No results found for this basename: LDLCALC   No results found for this basename: TRIG   No results found for this basename: CHOLHDL   No results found for this basename: LDLDIRECT         EKG: NSR, incomplete LBBB, nonspecific ST segment changes  FOLLOW UP PLANS AND APPOINTMENTS    Medication List     As of 08/09/2012  8:55 AM    STOP taking these medications         omeprazole 20 MG capsule   Commonly known as: PRILOSEC   Replaced by: pantoprazole 40 MG tablet      TAKE these medications         aspirin 325 MG EC tablet   Take 325 mg by mouth daily.      atorvastatin 20 MG tablet   Commonly known as: LIPITOR   Take 20 mg by mouth daily.      clopidogrel 75 MG tablet   Commonly known as: PLAVIX   Take 1 tablet (75 mg total) by mouth daily with breakfast.      fish oil-omega-3 fatty acids 1000 MG capsule   Take 2 g by mouth daily.      glyBURIDE-metformin 2.5-500 MG per tablet   Commonly known as: GLUCOVANCE   Take 1 tablet by mouth 2 (two) times daily with a meal.   Start taking on: 08/10/2012      insulin detemir 100 UNIT/ML injection   Commonly known as: LEVEMIR   Inject 20 Units into the skin at bedtime.      lisinopril 20 MG tablet  Commonly known as: PRINIVIL,ZESTRIL   Take 20 mg by mouth daily.      pantoprazole 40 MG tablet   Commonly known as: PROTONIX   Take 1 tablet (40 mg total) by mouth daily at 6 (six) AM.      PARoxetine 20 MG tablet   Commonly known as: PAXIL   Take 20 mg by mouth every morning.      PROBIOTIC DAILY PO   Take 1 capsule by mouth daily.           Follow-up Information    Follow up with [name]., MD. On 08/23/2012.   Contact information:   [place] [address] [phone]          BRING ALL MEDICATIONS WITH YOU TO FOLLOW UP APPOINTMENTS  Time spent with patient to include physician time:20 minutes Signed: Jesson Foskey S. 08/09/2012, 8:55 AM

## 2012-08-09 NOTE — Discharge Instructions (Signed)
Follow post radial cath instructions.

## 2012-08-29 ENCOUNTER — Inpatient Hospital Stay (HOSPITAL_COMMUNITY): Admission: RE | Admit: 2012-08-29 | Payer: Medicare Other | Source: Ambulatory Visit

## 2012-09-02 ENCOUNTER — Encounter (HOSPITAL_COMMUNITY): Payer: Medicare Other

## 2012-09-04 ENCOUNTER — Encounter (HOSPITAL_COMMUNITY): Payer: Medicare Other

## 2012-09-05 ENCOUNTER — Encounter (HOSPITAL_COMMUNITY)
Admission: RE | Admit: 2012-09-05 | Discharge: 2012-09-05 | Disposition: A | Discharge status: Home / Self Care | Payer: Medicare Other | Source: Ambulatory Visit | Attending: Interventional Cardiology | Admitting: Interventional Cardiology

## 2012-09-05 DIAGNOSIS — E119 Type 2 diabetes mellitus without complications: Secondary | ICD-10-CM | POA: Insufficient documentation

## 2012-09-05 DIAGNOSIS — Z5189 Encounter for other specified aftercare: Secondary | ICD-10-CM | POA: Insufficient documentation

## 2012-09-05 DIAGNOSIS — I209 Angina pectoris, unspecified: Secondary | ICD-10-CM | POA: Insufficient documentation

## 2012-09-05 DIAGNOSIS — I428 Other cardiomyopathies: Secondary | ICD-10-CM | POA: Insufficient documentation

## 2012-09-05 DIAGNOSIS — R9389 Abnormal findings on diagnostic imaging of other specified body structures: Secondary | ICD-10-CM | POA: Insufficient documentation

## 2012-09-05 DIAGNOSIS — I2582 Chronic total occlusion of coronary artery: Secondary | ICD-10-CM | POA: Insufficient documentation

## 2012-09-05 DIAGNOSIS — I2510 Atherosclerotic heart disease of native coronary artery without angina pectoris: Secondary | ICD-10-CM | POA: Insufficient documentation

## 2012-09-05 NOTE — Progress Notes (Signed)
Cardiac Rehab Medication Review by a Pharmacist  Does the patient  feel that his/her medications are working for him/her?  yes  Has the patient been experiencing any side effects to the medications prescribed?  Yes, did have some increased bleeding likely due to her DAP therapy with aspirin and Plavix. She reports a spot on her leg that started bleeding a lot, but with elevation and pressure she was able to get it stopped.  Does the patient measure his/her own blood pressure or blood glucose at home?  Yes, blood sugar (3 times a day) and blood pressure every once in a while  Does the patient have any problems obtaining medications due to transportation or finances?   no  Understanding of regimen: good Understanding of indications: good Potential of compliance: excellent    Pharmacist comments: Spent time educating the patient about her new medications clopidogrel, aspirin, and atorvastatin. Also explained how lisinopril (which she was on previously) was beneficial for her heart. Educated the patient that if bleeding occurs which she cannot get to stop or if she notices blood in the urine, nosebleed, coughing or vomiting blood that she should be seen by doc. Patient's glyburide bottle said to take 5mg  once daily, and the patient notes she had been taking 5 twice daily (thought this was correct because she used to take 2.5 twice daily). Patient stated she would contact her doctor to clarify the dose. Notes her sugars run high, so no evidence of hypoglycemia. Very pleasant lady.    Mathis Fare 09/05/2012 8:35 AM

## 2012-09-06 ENCOUNTER — Encounter (HOSPITAL_COMMUNITY): Payer: Medicare Other

## 2012-09-09 ENCOUNTER — Encounter (HOSPITAL_COMMUNITY): Payer: Medicare Other

## 2012-09-11 ENCOUNTER — Encounter (HOSPITAL_COMMUNITY)
Admission: RE | Admit: 2012-09-11 | Discharge: 2012-09-11 | Disposition: A | Discharge status: Home / Self Care | Payer: Medicare Other | Source: Ambulatory Visit | Attending: Interventional Cardiology | Admitting: Interventional Cardiology

## 2012-09-11 ENCOUNTER — Encounter (HOSPITAL_COMMUNITY): Payer: Medicare Other

## 2012-09-11 LAB — GLUCOSE, CAPILLARY
Glucose-Capillary: 115 mg/dL — ABNORMAL HIGH (ref 70–99)
Glucose-Capillary: 93 mg/dL (ref 70–99)

## 2012-09-11 NOTE — Progress Notes (Signed)
Pt in today for first cardiac rehab exercise session in the 1115 exercise class.  Pt tolerated light exercise with no difficulties.  Monitor showed sr with no ectopy noted.  Pt plans to attend 2 x week for six weeks due to high copay associated with cardiac rehab.

## 2012-09-13 ENCOUNTER — Encounter (HOSPITAL_COMMUNITY): Payer: Medicare Other

## 2012-09-16 ENCOUNTER — Encounter (HOSPITAL_COMMUNITY): Payer: Medicare Other

## 2012-09-18 ENCOUNTER — Encounter (HOSPITAL_COMMUNITY)
Admission: RE | Admit: 2012-09-18 | Discharge: 2012-09-18 | Disposition: A | Discharge status: Home / Self Care | Payer: Medicare Other | Source: Ambulatory Visit | Attending: Interventional Cardiology | Admitting: Interventional Cardiology

## 2012-09-18 ENCOUNTER — Encounter (HOSPITAL_COMMUNITY): Payer: Medicare Other

## 2012-09-18 DIAGNOSIS — I2582 Chronic total occlusion of coronary artery: Secondary | ICD-10-CM | POA: Insufficient documentation

## 2012-09-18 DIAGNOSIS — R9389 Abnormal findings on diagnostic imaging of other specified body structures: Secondary | ICD-10-CM | POA: Insufficient documentation

## 2012-09-18 DIAGNOSIS — Z5189 Encounter for other specified aftercare: Secondary | ICD-10-CM | POA: Insufficient documentation

## 2012-09-18 DIAGNOSIS — I428 Other cardiomyopathies: Secondary | ICD-10-CM | POA: Insufficient documentation

## 2012-09-18 DIAGNOSIS — I209 Angina pectoris, unspecified: Secondary | ICD-10-CM | POA: Insufficient documentation

## 2012-09-18 DIAGNOSIS — I2510 Atherosclerotic heart disease of native coronary artery without angina pectoris: Secondary | ICD-10-CM | POA: Insufficient documentation

## 2012-09-18 DIAGNOSIS — E119 Type 2 diabetes mellitus without complications: Secondary | ICD-10-CM | POA: Insufficient documentation

## 2012-09-18 LAB — GLUCOSE, CAPILLARY
Glucose-Capillary: 220 mg/dL — ABNORMAL HIGH (ref 70–99)
Glucose-Capillary: 159 mg/dL — ABNORMAL HIGH (ref 70–99)

## 2012-09-20 ENCOUNTER — Encounter (HOSPITAL_COMMUNITY): Payer: Medicare Other

## 2012-09-23 ENCOUNTER — Encounter (HOSPITAL_COMMUNITY): Payer: Medicare Other

## 2012-09-25 ENCOUNTER — Encounter (HOSPITAL_COMMUNITY)
Admission: RE | Admit: 2012-09-25 | Discharge: 2012-09-25 | Disposition: A | Discharge status: Home / Self Care | Payer: Medicare Other | Source: Ambulatory Visit | Attending: Interventional Cardiology | Admitting: Interventional Cardiology

## 2012-09-25 ENCOUNTER — Encounter (HOSPITAL_COMMUNITY): Payer: Medicare Other

## 2012-09-25 LAB — GLUCOSE, CAPILLARY
Glucose-Capillary: 278 mg/dL — ABNORMAL HIGH (ref 70–99)
Glucose-Capillary: 223 mg/dL — ABNORMAL HIGH (ref 70–99)

## 2012-09-27 ENCOUNTER — Encounter (HOSPITAL_COMMUNITY): Payer: Medicare Other

## 2012-09-27 ENCOUNTER — Encounter (HOSPITAL_COMMUNITY)
Admission: RE | Admit: 2012-09-27 | Discharge: 2012-09-27 | Disposition: A | Discharge status: Home / Self Care | Payer: Medicare Other | Source: Ambulatory Visit | Attending: Interventional Cardiology | Admitting: Interventional Cardiology

## 2012-09-30 ENCOUNTER — Encounter (HOSPITAL_COMMUNITY): Payer: Medicare Other

## 2012-09-30 ENCOUNTER — Encounter (HOSPITAL_COMMUNITY)
Admission: RE | Admit: 2012-09-30 | Discharge: 2012-09-30 | Disposition: A | Discharge status: Home / Self Care | Payer: Medicare Other | Source: Ambulatory Visit | Attending: Interventional Cardiology | Admitting: Interventional Cardiology

## 2012-10-02 ENCOUNTER — Encounter (HOSPITAL_COMMUNITY): Payer: Medicare Other

## 2012-10-04 ENCOUNTER — Encounter (HOSPITAL_COMMUNITY)
Admission: RE | Admit: 2012-10-04 | Discharge: 2012-10-04 | Disposition: A | Discharge status: Home / Self Care | Payer: Medicare Other | Source: Ambulatory Visit | Attending: Interventional Cardiology | Admitting: Interventional Cardiology

## 2012-10-04 ENCOUNTER — Encounter (HOSPITAL_COMMUNITY): Payer: Medicare Other

## 2012-10-07 ENCOUNTER — Encounter (HOSPITAL_COMMUNITY): Payer: Medicare Other

## 2012-10-09 ENCOUNTER — Encounter (HOSPITAL_COMMUNITY): Payer: Medicare Other

## 2012-10-09 ENCOUNTER — Encounter (HOSPITAL_COMMUNITY)
Admission: RE | Admit: 2012-10-09 | Discharge: 2012-10-09 | Disposition: A | Discharge status: Home / Self Care | Payer: Medicare Other | Source: Ambulatory Visit | Attending: Interventional Cardiology | Admitting: Interventional Cardiology

## 2012-10-09 LAB — GLUCOSE, CAPILLARY
Glucose-Capillary: 140 mg/dL — ABNORMAL HIGH (ref 70–99)
Glucose-Capillary: 167 mg/dL — ABNORMAL HIGH (ref 70–99)

## 2012-10-11 ENCOUNTER — Encounter (HOSPITAL_COMMUNITY): Payer: Medicare Other

## 2012-10-14 ENCOUNTER — Encounter (HOSPITAL_COMMUNITY): Payer: Medicare Other

## 2012-10-16 ENCOUNTER — Encounter (HOSPITAL_COMMUNITY): Payer: Medicare Other

## 2012-10-16 ENCOUNTER — Encounter (HOSPITAL_COMMUNITY): Admission: RE | Admit: 2012-10-16 | Payer: Medicare Other | Source: Ambulatory Visit

## 2012-10-18 ENCOUNTER — Encounter (HOSPITAL_COMMUNITY): Payer: Medicare Other

## 2012-10-21 ENCOUNTER — Encounter (HOSPITAL_COMMUNITY): Payer: Medicare Other

## 2012-10-23 ENCOUNTER — Encounter (HOSPITAL_COMMUNITY): Payer: Medicare Other

## 2012-10-25 ENCOUNTER — Encounter (HOSPITAL_COMMUNITY): Payer: Medicare Other

## 2012-10-28 ENCOUNTER — Encounter (HOSPITAL_COMMUNITY): Payer: Medicare Other

## 2012-10-30 ENCOUNTER — Encounter (HOSPITAL_COMMUNITY): Payer: Medicare Other

## 2012-11-01 ENCOUNTER — Encounter (HOSPITAL_COMMUNITY): Payer: Medicare Other

## 2012-11-04 ENCOUNTER — Encounter (HOSPITAL_COMMUNITY): Payer: Medicare Other

## 2012-11-06 ENCOUNTER — Encounter (HOSPITAL_COMMUNITY): Payer: Medicare Other

## 2012-11-08 ENCOUNTER — Encounter (HOSPITAL_COMMUNITY): Payer: Medicare Other

## 2012-11-11 ENCOUNTER — Encounter (HOSPITAL_COMMUNITY): Payer: Medicare Other

## 2012-11-13 ENCOUNTER — Encounter (HOSPITAL_COMMUNITY): Payer: Medicare Other

## 2012-11-15 ENCOUNTER — Encounter (HOSPITAL_COMMUNITY): Payer: Medicare Other

## 2012-11-18 ENCOUNTER — Encounter (HOSPITAL_COMMUNITY): Payer: Medicare Other

## 2012-11-20 ENCOUNTER — Encounter (HOSPITAL_COMMUNITY): Payer: Medicare Other

## 2012-11-22 ENCOUNTER — Encounter (HOSPITAL_COMMUNITY): Payer: Medicare Other

## 2012-11-25 ENCOUNTER — Encounter (HOSPITAL_COMMUNITY): Payer: Medicare Other

## 2012-11-27 ENCOUNTER — Encounter (HOSPITAL_COMMUNITY): Payer: Medicare Other

## 2012-11-29 ENCOUNTER — Encounter (HOSPITAL_COMMUNITY): Payer: Medicare Other

## 2012-12-02 ENCOUNTER — Encounter (HOSPITAL_COMMUNITY): Payer: Medicare Other

## 2012-12-04 ENCOUNTER — Encounter (HOSPITAL_COMMUNITY): Payer: Medicare Other

## 2012-12-06 ENCOUNTER — Encounter (HOSPITAL_COMMUNITY): Payer: Medicare Other

## 2012-12-11 ENCOUNTER — Encounter (HOSPITAL_COMMUNITY): Payer: Medicare Other

## 2012-12-13 ENCOUNTER — Encounter (HOSPITAL_COMMUNITY): Payer: Medicare Other

## 2012-12-18 ENCOUNTER — Encounter (HOSPITAL_COMMUNITY): Payer: Medicare Other

## 2012-12-20 ENCOUNTER — Encounter (HOSPITAL_COMMUNITY): Payer: Medicare Other

## 2012-12-25 ENCOUNTER — Encounter (HOSPITAL_COMMUNITY): Payer: Medicare Other

## 2012-12-27 ENCOUNTER — Encounter (HOSPITAL_COMMUNITY): Payer: Medicare Other

## 2013-01-01 ENCOUNTER — Encounter (HOSPITAL_COMMUNITY): Payer: Medicare Other

## 2013-01-03 ENCOUNTER — Encounter (HOSPITAL_COMMUNITY): Payer: Medicare Other

## 2013-01-08 ENCOUNTER — Encounter (HOSPITAL_COMMUNITY): Payer: Medicare Other

## 2013-01-10 ENCOUNTER — Encounter (HOSPITAL_COMMUNITY): Payer: Medicare Other

## 2013-01-15 ENCOUNTER — Other Ambulatory Visit: Payer: Self-pay | Admitting: Orthopedic Surgery

## 2013-01-15 ENCOUNTER — Encounter (HOSPITAL_COMMUNITY): Payer: Medicare Other

## 2013-01-15 DIAGNOSIS — M25511 Pain in right shoulder: Secondary | ICD-10-CM

## 2013-01-22 ENCOUNTER — Ambulatory Visit
Admission: RE | Admit: 2013-01-22 | Discharge: 2013-01-22 | Disposition: A | Discharge status: Home / Self Care | Payer: Medicare Other | Source: Ambulatory Visit | Attending: Orthopedic Surgery | Admitting: Orthopedic Surgery

## 2013-01-22 ENCOUNTER — Encounter (HOSPITAL_COMMUNITY): Payer: Medicare Other

## 2013-01-22 DIAGNOSIS — M25511 Pain in right shoulder: Secondary | ICD-10-CM

## 2013-04-15 ENCOUNTER — Other Ambulatory Visit: Payer: Self-pay | Admitting: Interventional Cardiology

## 2013-04-15 DIAGNOSIS — Z79899 Other long term (current) drug therapy: Secondary | ICD-10-CM

## 2013-04-15 DIAGNOSIS — E782 Mixed hyperlipidemia: Secondary | ICD-10-CM

## 2013-06-23 ENCOUNTER — Encounter: Payer: Self-pay | Admitting: *Deleted

## 2013-06-23 ENCOUNTER — Encounter: Payer: Self-pay | Admitting: Interventional Cardiology

## 2013-06-24 ENCOUNTER — Encounter: Payer: Self-pay | Admitting: Interventional Cardiology

## 2013-06-24 ENCOUNTER — Ambulatory Visit (INDEPENDENT_AMBULATORY_CARE_PROVIDER_SITE_OTHER): Payer: Medicare Other | Admitting: Interventional Cardiology

## 2013-06-24 VITALS — BP 172/86 | HR 80 | Ht 68.0 in | Wt 185.8 lb

## 2013-06-24 DIAGNOSIS — I428 Other cardiomyopathies: Secondary | ICD-10-CM

## 2013-06-24 DIAGNOSIS — I10 Essential (primary) hypertension: Secondary | ICD-10-CM

## 2013-06-24 DIAGNOSIS — I059 Rheumatic mitral valve disease, unspecified: Secondary | ICD-10-CM

## 2013-06-24 DIAGNOSIS — I2510 Atherosclerotic heart disease of native coronary artery without angina pectoris: Secondary | ICD-10-CM

## 2013-06-24 DIAGNOSIS — E782 Mixed hyperlipidemia: Secondary | ICD-10-CM

## 2013-06-24 NOTE — Patient Instructions (Addendum)
Your physician wants you to follow-up in: 6 months with Dr. Eldridge Dace. You will receive a reminder letter in the mail two months in advance. If you don't receive a letter, please call our office to schedule the follow-up appointment.  Your physician recommends that you continue on your current medications as directed. Please refer to the Current Medication list given to you today.  Your physician has requested that you regularly monitor and record your blood pressure readings at home. Please use the same machine at the same time of day to check your readings and record them to bring to your follow-up visit. Target BP is less than 130/80 and call if BP stay consistently elevated above this number.

## 2013-06-24 NOTE — Progress Notes (Signed)
Patient ID: [name], female   DOB: October 27, 1931, 79 y.o.   MRN: [id number]    [address] [place], [place]  [address] Phone: [phone] Fax:  [phone]  Date:  06/24/2013   ID:  Anusha, Claus 02-Apr-1932, MRN [id number]  PCP:  Emeterio Reeve, MD      History of Present Illness: [name] is a 79 y.o. female who had a circumflex stent placed in Jan 2014. She had a PTCA of the OM. She has a CTO of the RCA with good collaterals. She feels much better. She did rehab or a while. SHe feels better than she has in a year. She was walking 15 minutes a day but tis has decreased due to right shoulder pain and cold weather.  No problems with the walking she does.  She had a basal cell CA removed from her nose.    No further chest discomfort. She denies shortness of breath. She has not had lightheadedness or syncope. She has not had palpitations. Overall, she feels well from a cardiac standpoint.    Wt Readings from Last 3 Encounters:  06/24/13 185 lb 12.8 oz (84.278 kg)  09/05/12 182 lb 5.1 oz (82.7 kg)  08/09/12 189 lb 9.5 oz (86 kg)     Past Medical History  Diagnosis Date  . Complication of anesthesia   . PONV (postoperative nausea and vomiting)   . Coronary artery disease   . Hypertension   . Hypothyroidism   . Anxiety   . Shortness of breath   . Diabetes mellitus without complication     insulin dependent  . GERD (gastroesophageal reflux disease)   . Neuromuscular disorder     muscle cramps to lower extremities  . Other primary cardiomyopathies     Current Outpatient Prescriptions  Medication Sig Dispense Refill  . ACCU-CHEK AVIVA PLUS test strip As directed      . aspirin 325 MG EC tablet Take 325 mg by mouth daily.      . carvedilol (COREG) 6.25 MG tablet 6.25 mg daily.      . clopidogrel (PLAVIX) 75 MG tablet Take 1 tablet (75 mg total) by mouth daily with breakfast.  30 tablet  11  . fish oil-omega-3 fatty acids 1000 MG capsule Take 2 g by  mouth daily.      Marland Kitchen glyBURIDE (DIABETA) 5 MG tablet Take 5 mg by mouth daily with breakfast.      . glyBURIDE-metformin (GLUCOVANCE) 2.5-500 MG per tablet Take 1 tablet by mouth 2 (two) times daily with a meal.      . lisinopril (PRINIVIL,ZESTRIL) 20 MG tablet Take 20 mg by mouth daily.      . metFORMIN (GLUCOPHAGE) 500 MG tablet Take 500 mg by mouth 2 (two) times daily.      . pantoprazole (PROTONIX) 40 MG tablet Take 1 tablet (40 mg total) by mouth daily at 6 (six) AM.  30 tablet  11  . PARoxetine (PAXIL) 20 MG tablet Take 20 mg by mouth every morning.      . Probiotic Product (PROBIOTIC DAILY PO) Take 1 capsule by mouth daily.      Marland Kitchen atorvastatin (LIPITOR) 20 MG tablet Take 20 mg by mouth daily.      . insulin detemir (LEVEMIR) 100 UNIT/ML injection Inject 20 Units into the skin at bedtime.       No current facility-administered medications for this visit.    Allergies:    Allergies  Allergen Reactions  . Sulfa Antibiotics Nausea And Vomiting    Severe vomiting.     Social History:  The patient  reports that she has never smoked. She has never used smokeless tobacco. She reports that she drinks alcohol. She reports that she does not use illicit drugs.   Family History:  The patient's family history includes Diabetes in her sister; Heart disease in her father.   ROS:  Please see the history of present illness.  No nausea, vomiting.  No fevers, chills.  No focal weakness.  No dysuria.    All other systems reviewed and negative.   PHYSICAL EXAM: VS:  BP 172/86  Pulse 80  Ht 5\' 8"  (1.727 m)  Wt 185 lb 12.8 oz (84.278 kg)  BMI 28.26 kg/m2 Well nourished, well developed, in no acute distress HEENT: normal Neck: no JVD, no carotid bruits Cardiac:  normal S1, S2; RRR;  Lungs:  clear to auscultation bilaterally, no wheezing, rhonchi or rales Abd: soft, nontender, no hepatomegaly Ext: no edema Skin: warm and dry Neuro:   no focal abnormalities noted       ASSESSMENT AND  PLAN:  Coronary atherosclerosis of native coronary artery  Start Nitroglycerin 0.4 mg tablet, 0.4 mg, 1 tablet as directed, SL, as directed prn chest pain, 1 vial, Refills 6 Notes: Feeling better. Angina resolved. No need for intervention on CTO of RCA at this time.    2. Mixed hyperlipidemia  Refill Fish Oil Capsule, 1000 MG, 2 capsules, Orally, Once a day, 30 days, 60, Refills 11 Continue Lipitor Tablet, 20 MG, 1 tablet, Orally, once a day Notes: LDL target < 100. TG elevated. Keep glucose down.    3. Essential hypertension, benign  Refill Coreg Tablet, 6.25 MG, 1 tablet with food, Orally, BID, 30 days, 60, Refills 11 Notes: Elevated today. She should check her BP more at home. BP target < 130/80.   4. Mitral regurgitation: Moderate in Jan 2014.  No CHF sx.  Preventive Medicine  Adult topics discussed:  Exercise: at least 30 minutes of aerobic exercise, 5 days a week.      Signed, Fredric Mare, MD, [facility] 06/24/2013 12:17 PM

## 2013-07-28 ENCOUNTER — Encounter: Payer: Self-pay | Admitting: Interventional Cardiology

## 2013-08-07 ENCOUNTER — Other Ambulatory Visit: Payer: Self-pay

## 2013-08-07 MED ORDER — PANTOPRAZOLE SODIUM 40 MG PO TBEC: 40.0000 mg | DELAYED_RELEASE_TABLET | Freq: Every day | ORAL | Status: DC

## 2013-08-08 ENCOUNTER — Telehealth: Payer: Self-pay | Admitting: Cardiology

## 2013-08-08 DIAGNOSIS — E782 Mixed hyperlipidemia: Secondary | ICD-10-CM

## 2013-08-08 MED ORDER — FISH OIL 1000 MG PO CPDR: 5000.0000 mg | DELAYED_RELEASE_CAPSULE | Freq: Every day | ORAL | Status: DC

## 2013-08-08 NOTE — Telephone Encounter (Signed)
Message copied by Alcario Drought on Fri Aug 08, 2013  2:37 PM ------      Message from: SMART, [place] G      Created: Fri Aug 01, 2013  9:27 AM       07/2013 labs:  TG 489, TC 239, LDL 111, HDL 35, A1C 9.8, LFTs normal, Scr 0.62      LDL goal < 70 preferably (non-HDL goal < 100 preferably).      Meds:  Fish oil 3 g/d; Stopped lipitor 20 mg herself      Intolerant:  Tricor (leg pain).      Patient also stopped insulin recently which could explain TG trending up from 200's up to 400's.      Patient needs to restart lipitor 20 mg qd and increase fish oil.  Sees Dr. Stephanie Acre again next week and can hopefully get her back on insulin.      Plan:      1.  Increase fish oil to 5 g/d.      2.  Encourage patient to restart atorvastatin 20 mg daily (this is a low dose of this medication)      3.  F/u with Dr. Stephanie Acre as scheduled to discuss other medications.      4.  Recheck lipid panel and hepatic panel 8 weeks later.      Please notify patient, update meds, set up labs, and fax this over to Dr. Stephanie Acre as Juluis Rainier as well.  Thanks. ------

## 2013-08-08 NOTE — Telephone Encounter (Signed)
Pt notified. Meds updated and labs ordered.  

## 2013-09-09 ENCOUNTER — Other Ambulatory Visit: Payer: Self-pay

## 2013-09-09 DIAGNOSIS — E782 Mixed hyperlipidemia: Secondary | ICD-10-CM

## 2013-09-09 MED ORDER — FISH OIL 1000 MG PO CPDR: 5000.0000 mg | DELAYED_RELEASE_CAPSULE | Freq: Every day | ORAL | Status: DC

## 2013-10-01 ENCOUNTER — Other Ambulatory Visit: Payer: Self-pay

## 2013-10-01 MED ORDER — CARVEDILOL 6.25 MG PO TABS: 6.2500 mg | ORAL_TABLET | Freq: Two times a day (BID) | ORAL | Status: DC

## 2013-10-06 ENCOUNTER — Other Ambulatory Visit: Payer: Medicare Other

## 2013-10-20 ENCOUNTER — Other Ambulatory Visit (INDEPENDENT_AMBULATORY_CARE_PROVIDER_SITE_OTHER): Payer: Medicare Other

## 2013-10-20 DIAGNOSIS — E782 Mixed hyperlipidemia: Secondary | ICD-10-CM

## 2013-10-20 LAB — LIPID PANEL
Cholesterol: 126 mg/dL (ref 0–200)
HDL: 36.5 mg/dL — ABNORMAL LOW (ref >39.00)
LDL Cholesterol: 24 mg/dL (ref 0–99)
Total CHOL/HDL Ratio: 3
Triglycerides: 327 mg/dL — ABNORMAL HIGH (ref 0.0–149.0)
VLDL: 65.4 mg/dL — ABNORMAL HIGH (ref 0.0–40.0)

## 2013-10-20 LAB — HEPATIC FUNCTION PANEL
ALT: 21 U/L (ref 0–35)
AST: 16 U/L (ref 0–37)
Albumin: 3.7 g/dL (ref 3.5–5.2)
Alkaline Phosphatase: 45 U/L (ref 39–117)
Bilirubin, Direct: 0 mg/dL (ref 0.0–0.3)
Total Bilirubin: 0.4 mg/dL (ref 0.3–1.2)
Total Protein: 6.7 g/dL (ref 6.0–8.3)

## 2013-11-07 ENCOUNTER — Telehealth: Payer: Self-pay | Admitting: Interventional Cardiology

## 2013-11-07 DIAGNOSIS — E782 Mixed hyperlipidemia: Secondary | ICD-10-CM

## 2013-11-07 MED ORDER — OMEGA-3-ACID ETHYL ESTERS 1 G PO CAPS
2.0000 g | ORAL_CAPSULE | Freq: Two times a day (BID) | ORAL | Status: DC
Start: 2013-11-07 — End: 2014-12-30

## 2013-11-07 MED ORDER — CARVEDILOL 6.25 MG PO TABS: ORAL_TABLET | ORAL | Status: DC

## 2013-11-07 NOTE — Telephone Encounter (Signed)
New Message  Pt called states that her endocrinologist has doubled her Rx of Carvedilol.. Requests a call back to determine if this is safe to take.Marland Kitchen Please call.

## 2013-11-07 NOTE — Telephone Encounter (Signed)
Meds updated to Coreg 12.5 mg BID. FYI to Dr. Irish Lack.

## 2013-11-09 NOTE — Telephone Encounter (Signed)
Ok to increase carvedilol , likely to treat BP.

## 2013-11-10 NOTE — Telephone Encounter (Signed)
Noted  

## 2013-11-17 ENCOUNTER — Encounter: Payer: Self-pay | Admitting: Interventional Cardiology

## 2013-12-26 ENCOUNTER — Ambulatory Visit: Payer: Medicare Other | Admitting: Interventional Cardiology

## 2013-12-30 ENCOUNTER — Encounter: Payer: Self-pay | Admitting: Interventional Cardiology

## 2013-12-30 ENCOUNTER — Ambulatory Visit (INDEPENDENT_AMBULATORY_CARE_PROVIDER_SITE_OTHER): Payer: Medicare Other | Admitting: Interventional Cardiology

## 2013-12-30 ENCOUNTER — Encounter (INDEPENDENT_AMBULATORY_CARE_PROVIDER_SITE_OTHER): Payer: Self-pay

## 2013-12-30 VITALS — BP 168/79 | HR 74 | Ht 68.0 in | Wt 180.4 lb

## 2013-12-30 DIAGNOSIS — I5032 Chronic diastolic (congestive) heart failure: Secondary | ICD-10-CM | POA: Insufficient documentation

## 2013-12-30 DIAGNOSIS — I10 Essential (primary) hypertension: Secondary | ICD-10-CM

## 2013-12-30 DIAGNOSIS — R609 Edema, unspecified: Secondary | ICD-10-CM

## 2013-12-30 DIAGNOSIS — E782 Mixed hyperlipidemia: Secondary | ICD-10-CM

## 2013-12-30 DIAGNOSIS — I2510 Atherosclerotic heart disease of native coronary artery without angina pectoris: Secondary | ICD-10-CM

## 2013-12-30 MED ORDER — NITROGLYCERIN 0.4 MG SL SUBL: 0.4000 mg | SUBLINGUAL_TABLET | SUBLINGUAL | Status: DC | PRN

## 2013-12-30 MED ORDER — AMLODIPINE BESYLATE 5 MG PO TABS: 5.0000 mg | ORAL_TABLET | Freq: Every day | ORAL | Status: DC

## 2013-12-30 NOTE — Progress Notes (Signed)
Patient ID: [name], female   DOB: Apr 18, 1932, 80 y.o.   MRN: [id number]    [place] [place], [place]  [address] Phone: [phone] Fax:  [phone]  Date:  12/30/2013   ID:  Jeanette, Matthews 1931/09/23, MRN [id number]  PCP:  Lilian Coma, MD      History of Present Illness: [name] is a 80 y.o. female who had a circumflex stent placed in Jan 2014. She had a PTCA of the OM. She has a CTO of the RCA with good collaterals. She feels much better. She did rehab or a while. SHe feels better than she has in a year. She was walking 15 minutes a day but tis has decreased due to right shoulder pain and cold weather.  No problems with the walking she does.  She had a basal cell CA removed from her nose.    Had pain across her upper abdomen after walking a mile in the heat x 10 to 15 minutes. Pain is worse if lying on Right side. Pain gradually relieve with two aspirin, two fish oils, and rest. Also has shortness of breath and especially with intense heat. Tries to drink more water. Reports lightheadedness after taking all her pills in the morning. Has not check her BP at home. SBP is around 130 at her last medical visit. Has palpitations x 1-81min this morning that gradually went away. Hasn't exercise since it's so hot outside. Used to walk four miles a day. 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  Denies dyspnea at rest, syncope, palpitations, orthopnea, LE edema  Wt Readings from Last 3 Encounters:  12/30/13 180 lb 6.4 oz (81.829 kg)  06/24/13 185 lb 12.8 oz (84.278 kg)  09/05/12 182 lb 5.1 oz (82.7 kg)     Past Medical History  Diagnosis Date  . Complication of anesthesia   . PONV (postoperative nausea and vomiting)   . Coronary artery disease   . Hypertension   . Hypothyroidism   . Anxiety   . Shortness of breath   . Diabetes mellitus without complication     insulin dependent  . GERD (gastroesophageal reflux disease)   . Neuromuscular  disorder     muscle cramps to lower extremities  . Other primary cardiomyopathies     Current Outpatient Prescriptions  Medication Sig Dispense Refill  . ACCU-CHEK AVIVA PLUS test strip As directed      . aspirin 325 MG EC tablet Take 325 mg by mouth daily.      Marland Kitchen atorvastatin (LIPITOR) 20 MG tablet Take 20 mg by mouth daily.      . carvedilol (COREG) 6.25 MG tablet 2 tablets twice a day  60 tablet  6  . glyBURIDE (DIABETA) 5 MG tablet Take 5 mg by mouth daily with breakfast.      . lisinopril (PRINIVIL,ZESTRIL) 20 MG tablet Take 20 mg by mouth daily.      . metFORMIN (GLUCOPHAGE) 500 MG tablet Take 1,000 mg by mouth 2 (two) times daily.       Marland Kitchen omega-3 acid ethyl esters (LOVAZA) 1 G capsule Take 2 capsules (2 g total) by mouth 2 (two) times daily.  120 capsule  6  . pantoprazole (PROTONIX) 40 MG tablet Take 1 tablet (40 mg total) by mouth daily at 6 (six) AM.  30 tablet  6  . PARoxetine (PAXIL) 20 MG tablet Take 20 mg by mouth every morning.      Marland Kitchen  insulin detemir (LEVEMIR) 100 UNIT/ML injection Inject 20 Units into the skin at bedtime.       No current facility-administered medications for this visit.    Allergies:    Allergies  Allergen Reactions  . Sulfa Antibiotics Nausea And Vomiting    Severe vomiting.     Social History:  The patient  reports that she has never smoked. She has never used smokeless tobacco. She reports that she drinks alcohol. She reports that she does not use illicit drugs.   Family History:  The patient's family history includes Diabetes in her sister; Heart disease in her father.   ROS:  Please see the history of present illness.  No nausea, vomiting.  No fevers, chills.  No focal weakness.  No dysuria.  Occasional diarrhea. Abdominal pain.  All other systems reviewed and negative.   PHYSICAL EXAM: VS:  BP 168/79  Pulse 74  Ht 5\' 8"  (1.727 m)  Wt 180 lb 6.4 oz (81.829 kg)  BMI 27.44 kg/m2 Well nourished, well developed, in no acute distress HEENT:  normal Neck: no JVD, no carotid bruits Cardiac:  normal S1, S2; RRR;  Lungs:  clear to auscultation bilaterally, no wheezing, rhonchi or rales Abd: soft, mild tenderness, no rebound, no guarding  Ext: no edema Skin: warm and dry Neuro:   no focal abnormalities noted     ECG: NSR, LVH, NSST, no change fropm prior  ASSESSMENT AND PLAN:  Coronary atherosclerosis of native coronary artery  Refill Nitroglycerin 0.4 mg tablet, 0.4 mg, 1 tablet as directed, SL, as directed prn chest pain, 1 vial, Refills 6 Notes: Feeling better. Angina resolved. One episode of feeling bad while walking in the heat.   No need for intervention on CTO of RCA at this time. Medical mgmt.   2. Mixed hyperlipidemia  Refill Fish Oil Capsule, 1000 MG, 2 capsules, Orally, Once a day, 30 days, 60, Refills 11 Continue Lipitor Tablet, 20 MG, 1 tablet, Orally, once a day Notes: LDL target < 100. TG elevated in 4/15. Keep glucose down.    3. Essential hypertension, benign  Refill Coreg Tablet, 6.25 MG, 1 tablet with food, Orally, BID, 30 days, 60, Refills 11 Notes: Elevated today. She should check her BP more at home. BP target < 130/80.  Start amlodipine 5 mg daily.   Constipation side effect of this medicine may help given her loose bowel movements.   4. Mitral regurgitation: Moderate in Jan 2014.  No CHF sx.  Preventive Medicine  Adult topics discussed:  Exercise: at least 30 minutes of aerobic exercise, 5 days a week.    She will followup with Dr. Stephanie Acre regarding abdominal pain and loose bowel movements. It may be related to metformin.  Signed, Mina Marble, MD, [facility] 12/30/2013 8:58 AM

## 2013-12-30 NOTE — Patient Instructions (Signed)
Your physician has recommended you make the following change in your medication:   1. Start Amlodipine 5 mg 1 tablet daily.   Your physician wants you to follow-up in: 6 months with Dr. Irish Lack. You will receive a reminder letter in the mail two months in advance. If you don't receive a letter, please call our office to schedule the follow-up appointment.

## 2014-03-02 ENCOUNTER — Other Ambulatory Visit: Payer: Self-pay | Admitting: *Deleted

## 2014-03-02 MED ORDER — ATORVASTATIN CALCIUM 20 MG PO TABS: 20.0000 mg | ORAL_TABLET | Freq: Every day | ORAL | Status: DC

## 2014-03-09 ENCOUNTER — Other Ambulatory Visit: Payer: Medicare Other

## 2014-03-17 ENCOUNTER — Other Ambulatory Visit: Payer: Self-pay

## 2014-03-17 MED ORDER — PANTOPRAZOLE SODIUM 40 MG PO TBEC: 40.0000 mg | DELAYED_RELEASE_TABLET | Freq: Every day | ORAL | Status: DC

## 2014-06-07 ENCOUNTER — Other Ambulatory Visit: Payer: Self-pay | Admitting: Interventional Cardiology

## 2014-06-22 ENCOUNTER — Other Ambulatory Visit (INDEPENDENT_AMBULATORY_CARE_PROVIDER_SITE_OTHER): Payer: Medicare Other | Admitting: *Deleted

## 2014-06-22 DIAGNOSIS — E782 Mixed hyperlipidemia: Secondary | ICD-10-CM

## 2014-06-22 LAB — HEPATIC FUNCTION PANEL
ALT: 23 U/L (ref 0–35)
AST: 21 U/L (ref 0–37)
Albumin: 3.7 g/dL (ref 3.5–5.2)
Alkaline Phosphatase: 51 U/L (ref 39–117)
Bilirubin, Direct: 0 mg/dL (ref 0.0–0.3)
Total Bilirubin: 0.5 mg/dL (ref 0.2–1.2)
Total Protein: 6.6 g/dL (ref 6.0–8.3)

## 2014-06-22 LAB — LDL CHOLESTEROL, DIRECT: Direct LDL: 53.1 mg/dL

## 2014-06-22 LAB — LIPID PANEL
Cholesterol: 130 mg/dL (ref 0–200)
HDL: 31.6 mg/dL — ABNORMAL LOW (ref >39.00)
NonHDL: 98.4
Total CHOL/HDL Ratio: 4
Triglycerides: 283 mg/dL — ABNORMAL HIGH (ref 0.0–149.0)
VLDL: 56.6 mg/dL — ABNORMAL HIGH (ref 0.0–40.0)

## 2014-06-25 ENCOUNTER — Encounter (HOSPITAL_COMMUNITY): Payer: Self-pay | Admitting: Interventional Cardiology

## 2014-06-29 ENCOUNTER — Ambulatory Visit (INDEPENDENT_AMBULATORY_CARE_PROVIDER_SITE_OTHER): Payer: Medicare Other | Admitting: Interventional Cardiology

## 2014-06-29 ENCOUNTER — Encounter: Payer: Self-pay | Admitting: Interventional Cardiology

## 2014-06-29 VITALS — BP 178/84 | HR 80 | Ht 68.0 in | Wt 182.1 lb

## 2014-06-29 DIAGNOSIS — I10 Essential (primary) hypertension: Secondary | ICD-10-CM

## 2014-06-29 DIAGNOSIS — I2510 Atherosclerotic heart disease of native coronary artery without angina pectoris: Secondary | ICD-10-CM

## 2014-06-29 DIAGNOSIS — E782 Mixed hyperlipidemia: Secondary | ICD-10-CM

## 2014-06-29 MED ORDER — AMLODIPINE BESYLATE 10 MG PO TABS: 10.0000 mg | ORAL_TABLET | Freq: Every day | ORAL | Status: DC

## 2014-06-29 NOTE — Progress Notes (Signed)
Patient ID: [name], female   DOB: 1931-07-29, 80 y.o.   MRN: [id number] Patient ID: [name], female   DOB: 06/12/1932, 80 y.o.   MRN: [id number]    [place], [address] [place], [place]  [address] Phone: [phone] Fax:  [phone]  Date:  06/29/2014   ID:  [name], [name] Sep 11, 1931, MRN [id number]  PCP:  Lilian Coma, MD      History of Present Illness: [name] is a 80 y.o. female who had a circumflex stent placed in Jan 2014. She had a PTCA of the OM. She has a CTO of the RCA with good collaterals. She feels much better. She did rehab or a while.She is no longer walking regularly.  She has had issues with stress from her husbands illness.    Used to walk four miles a day. 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  Denies dyspnea at rest, syncope, palpitations, orthopnea, LE edema.  Has rarely used NTG "just to be on the safe side."  No bleeding issues.  No sx that would make her want to pursue angiography.   Wt Readings from Last 3 Encounters:  06/29/14 182 lb 1.9 oz (82.609 kg)  12/30/13 180 lb 6.4 oz (81.829 kg)  06/24/13 185 lb 12.8 oz (84.278 kg)     Past Medical History  Diagnosis Date  . Complication of anesthesia   . PONV (postoperative nausea and vomiting)   . Coronary artery disease   . Hypertension   . Hypothyroidism   . Anxiety   . Shortness of breath   . Diabetes mellitus without complication     insulin dependent  . GERD (gastroesophageal reflux disease)   . Neuromuscular disorder     muscle cramps to lower extremities  . Other primary cardiomyopathies     Current Outpatient Prescriptions  Medication Sig Dispense Refill  . ACCU-CHEK AVIVA PLUS test strip As directed    . amLODipine (NORVASC) 5 MG tablet Take 1 tablet (5 mg total) by mouth daily. 30 tablet 6  . aspirin 325 MG EC tablet Take 325 mg by mouth daily.    Marland Kitchen atorvastatin (LIPITOR) 20 MG tablet TAKE ONE TABLET BY MOUTH ONCE DAILY 90 tablet 0  .  glyBURIDE (DIABETA) 5 MG tablet Take two tablets by mouth daily    . lisinopril (PRINIVIL,ZESTRIL) 20 MG tablet Take 20 mg by mouth daily.    . nitroGLYCERIN (NITROSTAT) 0.4 MG SL tablet Place 1 tablet (0.4 mg total) under the tongue every 5 (five) minutes as needed for chest pain. 25 tablet 5  . omega-3 acid ethyl esters (LOVAZA) 1 G capsule Take 2 capsules (2 g total) by mouth 2 (two) times daily. 120 capsule 6  . pantoprazole (PROTONIX) 40 MG tablet Take 1 tablet (40 mg total) by mouth daily at 6 (six) AM. 30 tablet 6  . PARoxetine (PAXIL) 20 MG tablet Take 20 mg by mouth every morning.    . carvedilol (COREG) 12.5 MG tablet Take 12.5 mg by mouth 2 (two) times daily.     No current facility-administered medications for this visit.    Allergies:    Allergies  Allergen Reactions  . Sulfa Antibiotics Nausea And Vomiting    Severe vomiting.     Social History:  The patient  reports that she has never smoked. She has never used smokeless tobacco. She reports that she drinks alcohol. She reports that she does not use illicit  drugs.   Family History:  The patient's family history includes Diabetes in her sister; Heart disease in her father.   ROS:  Please see the history of present illness.  No nausea, vomiting.  No fevers, chills.  No focal weakness.  No dysuria.  Occasional diarrhea. Abdominal pain.  All other systems reviewed and negative.   PHYSICAL EXAM: VS:  BP 178/84 mmHg  Pulse 80  Ht 5\' 8"  (1.727 m)  Wt 182 lb 1.9 oz (82.609 kg)  BMI 27.70 kg/m2 Well nourished, well developed, in no acute distress HEENT: normal Neck: no JVD, no carotid bruits Cardiac:  normal S1, S2; RRR;  Lungs:  clear to auscultation bilaterally, no wheezing, rhonchi or rales Abd: soft, mild tenderness, no rebound, no guarding  Ext: no edema Skin: warm and dry Neuro:   no focal abnormalities noted Psych: normal affect     ASSESSMENT AND PLAN:  Coronary atherosclerosis of native coronary artery    Continue Nitroglycerin 0.4 mg tablet, 0.4 mg, 1 tablet as directed, SL, as directed prn chest pain, 1 vial, Refills 6 Notes: Feeling better. Angina resolved. One episode of feeling bad while walking in the heat.   No need for intervention on CTO of RCA at this time. Medical mgmt. if she has to use her nitroglycerin more frequently, would consider PCI of the chronic total occlusion in the right coronary artery.    2. Mixed hyperlipidemia  Refill Fish Oil Capsule, 1000 MG, 2 capsules, Orally, Once a day, 30 days, 60, Refills 11 Continue Lipitor Tablet, 20 MG, 1 tablet, Orally, once a day Notes: LDL target < 100. TG elevated in 4/15. TG improved in 12/15: 283.  LDL 53 at that time. Keep glucose down.  She stopped insulin and metformin on her own due to diarrhea.  Her blood sugar is still the same. TG will be affected by blood sugar. Recheck lipids in 6 months. Followed in the lipid clinic.    3. Essential hypertension, benign  Refill Coreg Tablet, 6.25 MG, 1 tablet with food, Orally, BID, 30 days, 60, Refills 11 Notes: Elevated today. She should check her BP more at home. BP target < 130/80.  Increase amlodipine to 10 mg daily.      4. Mitral regurgitation: Moderate in Jan 2014.  No CHF sx.  Preventive Medicine  Adult topics discussed:  Exercise: at least 30 minutes of aerobic exercise, 5 days a week.      Signed, Mina Marble, MD, [facility] 06/29/2014 11:49 AM

## 2014-06-29 NOTE — Patient Instructions (Signed)
Your physician has recommended you make the following change in your medication:   INCREASE Amlodipine to 10 mg by mouth daily   Your physician wants you to follow-up in: 6 months with Dr. Irish Lack. You will receive a reminder letter in the mail two months in advance. If you don't receive a letter, please call our office to schedule the follow-up appointment.   Your physician recommends that you return for lab work in: Fasting Lipid and Liver panel in 6 months.  Your physician recommends you to check your blood pressure at home, and to notify the office if your blood pressure is above 130/80.

## 2014-09-11 ENCOUNTER — Other Ambulatory Visit: Payer: Self-pay | Admitting: *Deleted

## 2014-09-11 MED ORDER — CARVEDILOL 12.5 MG PO TABS: 12.5000 mg | ORAL_TABLET | Freq: Two times a day (BID) | ORAL | Status: DC

## 2014-10-20 ENCOUNTER — Ambulatory Visit: Payer: Self-pay

## 2014-10-21 ENCOUNTER — Other Ambulatory Visit: Payer: Self-pay

## 2014-10-21 ENCOUNTER — Ambulatory Visit (INDEPENDENT_AMBULATORY_CARE_PROVIDER_SITE_OTHER): Payer: PPO | Admitting: Podiatry

## 2014-10-21 ENCOUNTER — Encounter: Payer: Self-pay | Admitting: Podiatry

## 2014-10-21 ENCOUNTER — Ambulatory Visit (INDEPENDENT_AMBULATORY_CARE_PROVIDER_SITE_OTHER): Payer: PPO

## 2014-10-21 VITALS — BP 147/74 | HR 70 | Temp 98.0°F | Resp 12

## 2014-10-21 DIAGNOSIS — L03031 Cellulitis of right toe: Secondary | ICD-10-CM

## 2014-10-21 DIAGNOSIS — L89891 Pressure ulcer of other site, stage 1: Secondary | ICD-10-CM | POA: Diagnosis not present

## 2014-10-21 DIAGNOSIS — R52 Pain, unspecified: Secondary | ICD-10-CM

## 2014-10-21 DIAGNOSIS — L02611 Cutaneous abscess of right foot: Secondary | ICD-10-CM

## 2014-10-21 DIAGNOSIS — L97511 Non-pressure chronic ulcer of other part of right foot limited to breakdown of skin: Secondary | ICD-10-CM

## 2014-10-21 MED ORDER — AMOXICILLIN-POT CLAVULANATE 875-125 MG PO TABS
1.0000 | ORAL_TABLET | Freq: Two times a day (BID) | ORAL | Status: DC
Start: 2014-10-21 — End: 2014-10-28

## 2014-10-21 MED ORDER — ATORVASTATIN CALCIUM 20 MG PO TABS: 20.0000 mg | ORAL_TABLET | Freq: Every day | ORAL | Status: DC

## 2014-10-21 MED ORDER — SILVER SULFADIAZINE 1 % EX CREA: 1.0000 "application " | TOPICAL_CREAM | Freq: Every day | CUTANEOUS | Status: DC

## 2014-10-21 NOTE — Patient Instructions (Signed)
Begin taking antibiotics by mouth one twice a day 10 days Apply Silvadene to skin ulcer on second right toe daily and cover with gauze Wear toe prop and attach to base of third right toe Wear surgical shoe on the right foot If you develop any sudden fever, swelling, pain present to ER  Diabetes and Foot Care Diabetes may cause you to have problems because of poor blood supply (circulation) to your feet and legs. This may cause the skin on your feet to become thinner, break easier, and heal more slowly. Your skin may become dry, and the skin may peel and crack. You may also have nerve damage in your legs and feet causing decreased feeling in them. You may not notice minor injuries to your feet that could lead to infections or more serious problems. Taking care of your feet is one of the most important things you can do for yourself.  HOME CARE INSTRUCTIONS  Wear shoes at all times, even in the house. Do not go barefoot. Bare feet are easily injured.  Check your feet daily for blisters, cuts, and redness. If you cannot see the bottom of your feet, use a mirror or ask someone for help.  Wash your feet with warm water (do not use hot water) and mild soap. Then pat your feet and the areas between your toes until they are completely dry. Do not soak your feet as this can dry your skin.  Apply a moisturizing lotion or petroleum jelly (that does not contain alcohol and is unscented) to the skin on your feet and to dry, brittle toenails. Do not apply lotion between your toes.  Trim your toenails straight across. Do not dig under them or around the cuticle. File the edges of your nails with an emery board or nail file.  Do not cut corns or calluses or try to remove them with medicine.  Wear clean socks or stockings every day. Make sure they are not too tight. Do not wear knee-high stockings since they may decrease blood flow to your legs.  Wear shoes that fit properly and have enough cushioning. To  break in new shoes, wear them for just a few hours a day. This prevents you from injuring your feet. Always look in your shoes before you put them on to be sure there are no objects inside.  Do not cross your legs. This may decrease the blood flow to your feet.  If you find a minor scrape, cut, or break in the skin on your feet, keep it and the skin around it clean and dry. These areas may be cleansed with mild soap and water. Do not cleanse the area with peroxide, alcohol, or iodine.  When you remove an adhesive bandage, be sure not to damage the skin around it.  If you have a wound, look at it several times a day to make sure it is healing.  Do not use heating pads or hot water bottles. They may burn your skin. If you have lost feeling in your feet or legs, you may not know it is happening until it is too late.  Make sure your health care provider performs a complete foot exam at least annually or more often if you have foot problems. Report any cuts, sores, or bruises to your health care provider immediately. SEEK MEDICAL CARE IF:   You have an injury that is not healing.  You have cuts or breaks in the skin.  You have an ingrown nail.  You notice redness on your legs or feet.  You feel burning or tingling in your legs or feet.  You have pain or cramps in your legs and feet.  Your legs or feet are numb.  Your feet always feel cold. SEEK IMMEDIATE MEDICAL CARE IF:   There is increasing redness, swelling, or pain in or around a wound.  There is a red line that goes up your leg.  Pus is coming from a wound.  You develop a fever or as directed by your health care provider.  You notice a bad smell coming from an ulcer or wound. Document Released: 06/30/2000 Document Revised: 03/05/2013 Document Reviewed: 12/10/2012 [facility] Patient Information 2015 [place], [place]. This information is not intended to replace advice given to you by your health care provider. Make sure you  discuss any questions you have with your health care provider.

## 2014-10-21 NOTE — Progress Notes (Signed)
   Subjective:    Patient ID: [name], female    DOB: [birth year], 81 y.o.   MRN: [id number]  HPI  N-SORE, SWOLLEN L-RT FOOT 2ND TOE D-2 WEEKS O-SUDDENLY C-SAME NOT BETTER A-PRESSURE T-NEOSPORIN  Patient describes taking possible penicillin type antibiotic for the approximately 6 or 7 days without any change of symptoms.  Review of Systems  Constitutional: Positive for fatigue.  Eyes: Positive for visual disturbance.  Genitourinary: Positive for urgency and frequency.  Skin: Positive for color change.  Neurological: Positive for light-headedness.  All other systems reviewed and are negative.      Objective:   Physical Exam   Orientated 3  Vascular: DP pulses 1/4 bilaterally PT pulses 1/4 bilaterally Edema right foot nonpitting No calf edema or calf tenderness bilaterally  Neurological: Sensation to 10 g monofilament wire intact 3/5 right and 2/5 left Ankle reflexes reactive bilaterally Vibratory sensation nonreactive bilaterally  Dermatological: Edematous second right toe Hypertrophic second right toenail 5 mm ulcer distal second right toe with local erythema and edema. There is no malodor or active drainage noted  Musculoskeletal: HAV deformity right Hammertoe deformities 2-5 bilaterally  X-ray examination weightbearing right foot  Intact bony structure without fracture and/or dislocation Increased soft tissue density noted second r toe Increased soft tissue density associated with hypertrophic toenail second toe Lateral view demonstrates contracture of the distal interphalangeal joint second toe, with complete visibility of the distal phalanx   Radiographic impression: No acute bony abnormality noted in the right foot No x-ray evidence of osteomyelitis in second right toe           Assessment & Plan:   Assessment: I diabetic Decrease pedal pulses suggestive of possible peripheral arterial disease Diabetic peripheral neuropathy Diabetic  foot ulcer with cellulitis second toe right foot  Plan: The second right toenail was debrided to decrease pressure on the distal toe The ulcer margins were gently debrided and dressed with Silvadene A silicone toe crest was dispensed to elevate toes 2-4 bilaterally A surgical shoe was dispensed to wear in the right foot Augmentin 875/125 twice a day 10 days prescribed  Patient was advised could maintain Epsom salt soaks or antibacterial soft soaks and daily apply Silvadene to the ulcer on the second right toe Limit standing and walking Also, advised her if she developed any sudden fever ,swelling, pain to present to ER  Reappoint 7 days

## 2014-10-24 ENCOUNTER — Other Ambulatory Visit: Payer: Self-pay | Admitting: Interventional Cardiology

## 2014-10-28 ENCOUNTER — Ambulatory Visit (INDEPENDENT_AMBULATORY_CARE_PROVIDER_SITE_OTHER): Payer: PPO | Admitting: Podiatry

## 2014-10-28 ENCOUNTER — Encounter: Payer: Self-pay | Admitting: Podiatry

## 2014-10-28 VITALS — BP 162/77 | HR 82 | Temp 97.6°F | Resp 12

## 2014-10-28 DIAGNOSIS — L97511 Non-pressure chronic ulcer of other part of right foot limited to breakdown of skin: Secondary | ICD-10-CM

## 2014-10-28 DIAGNOSIS — L89891 Pressure ulcer of other site, stage 1: Secondary | ICD-10-CM | POA: Diagnosis not present

## 2014-10-28 DIAGNOSIS — L02611 Cutaneous abscess of right foot: Secondary | ICD-10-CM

## 2014-10-28 DIAGNOSIS — L03031 Cellulitis of right toe: Secondary | ICD-10-CM

## 2014-10-28 MED ORDER — AMOXICILLIN-POT CLAVULANATE 875-125 MG PO TABS: 1.0000 | ORAL_TABLET | Freq: Two times a day (BID) | ORAL | Status: DC

## 2014-10-28 NOTE — Patient Instructions (Signed)
Continue on Augmentin 875/125 by mouth one twice a day and refill after your first 10 days Continue once daily soaks and application of Silvadene to the second right toe and cover with gauze Wear the toe crest to support the toes 2-4 in the right foot. The ring goes on the third right toe Wear the surgical shoe on the right foot Limit yestanding and walking If you develop any sudden increase in pain, fever, redness, drainage present to ER

## 2014-10-28 NOTE — Progress Notes (Signed)
Patient ID: [name], female   DOB: 01/16/1932, 81 y.o.   MRN: [id number]  Subjective: Patient presents for follow-up visit of 10/22/2014 for diabetic ulceration and cellulitis. Currently patient is taking Augmentin 875/125 and denies any diarrhea, however, describes some mild stomach cramping. Patient is not wearing toe crest said was uncomfortable on the right foot  Objective: Orientated 3 The right foot mild nonpitting edema from mid foot distally. There is no erythema, warmth noted The second right toe remains mildly edematous, erythematous without active drainage or malodor The distal second right toe is a 3 mm ulcer that when probed is not probe to bone  Assessment: Reducing diabetic ulceration and cellulitis right foot  Plan: Continue on Augmentin 875/125 additional 10 days Continue daily antibacterial soft soap soaking Continue to apply Silvadene to the ulceration and distal second right toe and dressed with gauze daily Continue wear toe crest on right foot Continue to wear surgical shoe on the right foot Limit any and walking  Patient advised that she develops any sudden fever, swelling, redness, increased pain to present to ER otherwise  Reappoint 7 days

## 2014-11-04 ENCOUNTER — Encounter: Payer: Self-pay | Admitting: Podiatry

## 2014-11-04 ENCOUNTER — Ambulatory Visit (INDEPENDENT_AMBULATORY_CARE_PROVIDER_SITE_OTHER): Payer: PPO | Admitting: Podiatry

## 2014-11-04 VITALS — BP 129/69 | HR 84 | Temp 97.1°F | Resp 14

## 2014-11-04 DIAGNOSIS — L89891 Pressure ulcer of other site, stage 1: Secondary | ICD-10-CM | POA: Diagnosis not present

## 2014-11-04 DIAGNOSIS — L97511 Non-pressure chronic ulcer of other part of right foot limited to breakdown of skin: Secondary | ICD-10-CM

## 2014-11-04 NOTE — Progress Notes (Signed)
Patient ID: [name], female   DOB: March 29, 1932, 81 y.o.   MRN: [id number]  Subjective: This patient presents for follow-up care for diabetic ulceration cellulitis under our care since 10/22/2014. She is currently taking Augmentin 875/125 without any complaint from medication and denies any diarrhea.  Objective: The second right toe has distal erythema standing to the distal interphalangeal joint. The distal ulcer on the second right toe is 4 x 2 mm with some scaling tissue and a granular base. Probing the base of the ulcer does not probe to bone. There is pitting edema to the dorsal right midfoot, without warmth or erythema  Assessment: Reducing cellulitis and ulceration second right toe  Plan: Debrided distal ulcer and dressed with Silvadene Complete previous Rx of Augmentin and do not refill for a total of 20 days Continue to wear toe prop 2-4 right foot Continue wear surgical shoe on right foot  Reappoint 7 days

## 2014-11-04 NOTE — Patient Instructions (Signed)
Complete remaining prescription for Augmentin Continue to apply Silvadene cream and gauze dressing to the second right toe daily Continue to wear toe prop on the right third toe Continue wear surgical shoe right  If you develop any sudden increase in pain, swelling, redness present to ER

## 2014-11-11 ENCOUNTER — Ambulatory Visit (INDEPENDENT_AMBULATORY_CARE_PROVIDER_SITE_OTHER): Payer: PPO | Admitting: Podiatry

## 2014-11-11 ENCOUNTER — Other Ambulatory Visit: Payer: Self-pay

## 2014-11-11 ENCOUNTER — Encounter: Payer: Self-pay | Admitting: Podiatry

## 2014-11-11 ENCOUNTER — Telehealth: Payer: Self-pay

## 2014-11-11 VITALS — BP 130/62 | HR 74 | Resp 12

## 2014-11-11 DIAGNOSIS — L97511 Non-pressure chronic ulcer of other part of right foot limited to breakdown of skin: Secondary | ICD-10-CM | POA: Diagnosis not present

## 2014-11-11 NOTE — Patient Instructions (Signed)
Complete all remaining pre-these prescribed antibiotics, do not refill Continue to apply Silvadene cream and gauze dressing daily to the second right toe Continue to wear toe prop on right foot Continue wear surgical shoe on the right foot Limits standing and walking

## 2014-11-11 NOTE — Telephone Encounter (Signed)
refill 

## 2014-11-11 NOTE — Progress Notes (Signed)
Patient ID: [name], female   DOB: 1932-03-13, 81 y.o.   MRN: [id number]  Subjective: This patient presents for follow-up care for diabetic ulceration under care since 10/22/2014. She is complaining Augmentin 875/125 twice a day since the initial visit without any complaints from medication including diarrhea  Objective: Right foot has midfoot pitting edema Distal second right toe has low-grade edema erythema extending to the DIP joint joint Superficial ulcer distal second right toe 2 mm that does not probe to bone with a granular base surrounded by small amount of keratoses. There is no active drainage, malodor, warmth surrounding the distal ulcer  Assessment: Reducing cellulitis associated with ulceration second right toe  Plan: Debridement distal ulcer second right toe and dressed with Silvadene Complete any remaining prescription for Augmentin do not refill Continue to wear toe prop 2-4 Continue to wear surgical shoe on right foot Limit standing walking  Reappoint 7 days

## 2014-11-12 ENCOUNTER — Telehealth: Payer: Self-pay | Admitting: *Deleted

## 2014-11-12 NOTE — Telephone Encounter (Signed)
Patient returned call about refill request from her pharmacy for plavix. She stated that she no longer takes this medication.

## 2014-11-12 NOTE — Telephone Encounter (Signed)
I believe she is on aspirin only.  You can check with her.  SHe will know her medicines well.

## 2014-11-12 NOTE — Telephone Encounter (Signed)
Left message to call back  

## 2014-11-17 NOTE — Telephone Encounter (Signed)
The pt states that she is no longer taking Plavix.

## 2014-11-18 ENCOUNTER — Encounter: Payer: Self-pay | Admitting: Podiatry

## 2014-11-18 ENCOUNTER — Other Ambulatory Visit: Payer: Self-pay

## 2014-11-18 ENCOUNTER — Ambulatory Visit (INDEPENDENT_AMBULATORY_CARE_PROVIDER_SITE_OTHER): Payer: PPO | Admitting: Podiatry

## 2014-11-18 VITALS — BP 135/70 | HR 78 | Resp 12

## 2014-11-18 DIAGNOSIS — L89891 Pressure ulcer of other site, stage 1: Secondary | ICD-10-CM

## 2014-11-18 DIAGNOSIS — L97511 Non-pressure chronic ulcer of other part of right foot limited to breakdown of skin: Secondary | ICD-10-CM

## 2014-11-18 MED ORDER — CARVEDILOL 12.5 MG PO TABS: 12.5000 mg | ORAL_TABLET | Freq: Two times a day (BID) | ORAL | Status: DC

## 2014-11-18 NOTE — Patient Instructions (Signed)
Apply Silvadene cream to skin ulcers second right toe daily, cover with gauze and attached with 1 inch Coflex tape Continue wear toe prop on right foot Continue wear surgical shoe on right foot

## 2014-11-19 NOTE — Progress Notes (Signed)
Patient ID: [name], female   DOB: October 20, 1931, 81 y.o.   MRN: [id number]  Subjective: This patient presents for follow-up care for diabetic skin ulceration with cellulitis under our care since 10/22/2014. She is completed 10 days of Augmentin without a complaint from medication.  Objective: Right foot has midfoot pitting edema Distal second right toe as local edema erythema extending to the distal interphalangeal joint Superficial ulceration distal second right toe with granular base measuring 3 x 5 mm after debridement. This ulcer does not probe to bone The distal interphalangeal joint second right toe is contracted  Assessment: Noninfected ulceration distal second right toe Slight enlargement dermal ulceration  Plan: Debride ulcer second right toe Continue to apply Silvadene cream to the distal ulcer second right toe daily Continue to wear toe crest 2-4 right Continue to wear surgical shoe right Limit standing and walking  Had a discussion with patient today make him aware that this ulcer ultimately may not heal and may require surgical treatment  Reappoint 7 days

## 2014-11-25 ENCOUNTER — Encounter: Payer: Self-pay | Admitting: Podiatry

## 2014-11-25 ENCOUNTER — Ambulatory Visit (INDEPENDENT_AMBULATORY_CARE_PROVIDER_SITE_OTHER): Payer: PPO | Admitting: Podiatry

## 2014-11-25 VITALS — BP 116/64 | HR 82 | Temp 97.2°F | Resp 12

## 2014-11-25 DIAGNOSIS — L97511 Non-pressure chronic ulcer of other part of right foot limited to breakdown of skin: Secondary | ICD-10-CM

## 2014-11-25 DIAGNOSIS — L89891 Pressure ulcer of other site, stage 1: Secondary | ICD-10-CM

## 2014-11-25 NOTE — Progress Notes (Signed)
   Subjective:    Patient ID: [name], female    DOB: Nov 11, 1931, 81 y.o.   MRN: [id number]  HPI patient presents today for follow-up care for diabetic skin ulceration with initial cellulitis under care since 10/22/2014. She is previously completed 10 days of Augmentin without any complaint of the medication  Review of Systems     Objective:   Physical Exam  Orientated 3  The right foot maintains a constant mild pitting edema without erythema, warmth The distal right toe as local erythema extending to the distal interphalangeal joint with a superficial ulceration after debridement measuring 5 x 2 mm. It does not go to the bone. The base appears granular. 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 Patient responded that she would like to avoid surgical treatment of at all possible  Limit standing walking  Reappoint 2 weeks

## 2014-11-25 NOTE — Patient Instructions (Signed)
Apply Silvadene cream to skin ulcer on the tip of the second right toe daily and cover with gauze Wear toe crest to elevate toes on the right foot Wear the surgical shoe on the right foot  Diabetes and Foot Care Diabetes may cause you to have problems because of poor blood supply (circulation) to your feet and legs. This may cause the skin on your feet to become thinner, break easier, and heal more slowly. Your skin may become dry, and the skin may peel and crack. You may also have nerve damage in your legs and feet causing decreased feeling in them. You may not notice minor injuries to your feet that could lead to infections or more serious problems. Taking care of your feet is one of the most important things you can do for yourself.  HOME CARE INSTRUCTIONS  Wear shoes at all times, even in the house. Do not go barefoot. Bare feet are easily injured.  Check your feet daily for blisters, cuts, and redness. If you cannot see the bottom of your feet, use a mirror or ask someone for help.  Wash your feet with warm water (do not use hot water) and mild soap. Then pat your feet and the areas between your toes until they are completely dry. Do not soak your feet as this can dry your skin.  Apply a moisturizing lotion or petroleum jelly (that does not contain alcohol and is unscented) to the skin on your feet and to dry, brittle toenails. Do not apply lotion between your toes.  Trim your toenails straight across. Do not dig under them or around the cuticle. File the edges of your nails with an emery board or nail file.  Do not cut corns or calluses or try to remove them with medicine.  Wear clean socks or stockings every day. Make sure they are not too tight. Do not wear knee-high stockings since they may decrease blood flow to your legs.  Wear shoes that fit properly and have enough cushioning. To break in new shoes, wear them for just a few hours a day. This prevents you from injuring your feet.  Always look in your shoes before you put them on to be sure there are no objects inside.  Do not cross your legs. This may decrease the blood flow to your feet.  If you find a minor scrape, cut, or break in the skin on your feet, keep it and the skin around it clean and dry. These areas may be cleansed with mild soap and water. Do not cleanse the area with peroxide, alcohol, or iodine.  When you remove an adhesive bandage, be sure not to damage the skin around it.  If you have a wound, look at it several times a day to make sure it is healing.  Do not use heating pads or hot water bottles. They may burn your skin. If you have lost feeling in your feet or legs, you may not know it is happening until it is too late.  Make sure your health care provider performs a complete foot exam at least annually or more often if you have foot problems. Report any cuts, sores, or bruises to your health care provider immediately. SEEK MEDICAL CARE IF:   You have an injury that is not healing.  You have cuts or breaks in the skin.  You have an ingrown nail.  You notice redness on your legs or feet.  You feel burning or tingling in your  legs or feet.  You have pain or cramps in your legs and feet.  Your legs or feet are numb.  Your feet always feel cold. SEEK IMMEDIATE MEDICAL CARE IF:   There is increasing redness, swelling, or pain in or around a wound.  There is a red line that goes up your leg.  Pus is coming from a wound.  You develop a fever or as directed by your health care provider.  You notice a bad smell coming from an ulcer or wound. Document Released: 06/30/2000 Document Revised: 03/05/2013 Document Reviewed: 12/10/2012 [facility] Patient Information 2015 [place], [place]. This information is not intended to replace advice given to you by your health care provider. Make sure you discuss any questions you have with your health care provider.

## 2014-12-09 ENCOUNTER — Encounter: Payer: Self-pay | Admitting: Podiatry

## 2014-12-09 ENCOUNTER — Ambulatory Visit (INDEPENDENT_AMBULATORY_CARE_PROVIDER_SITE_OTHER): Payer: PPO | Admitting: Podiatry

## 2014-12-09 VITALS — BP 159/76 | HR 100 | Temp 97.3°F | Resp 12

## 2014-12-09 DIAGNOSIS — L97511 Non-pressure chronic ulcer of other part of right foot limited to breakdown of skin: Secondary | ICD-10-CM

## 2014-12-09 DIAGNOSIS — R609 Edema, unspecified: Secondary | ICD-10-CM

## 2014-12-09 NOTE — Patient Instructions (Signed)
Continue to apply Silvadene cream to skin ulcer on the second right toe daily and cover with gauze Continue to wear toe crest to elevate toes on the right foot Continue to wear surgical shoe right  The vascular lab we'll contact you and schedule an appointment directly for these vascular examinations Our office will contact the vascular lab to order: Lower extremity arterial Doppler to check blood flow into lower legs Venous Doppler right to evaluate chronic swelling right

## 2014-12-09 NOTE — Progress Notes (Signed)
Patient ID: ADDI PAK, female   DOB: Mar 16, 1932, 81 y.o.   MRN: [id number]  Subjective: This patient presents for ongoing care for diabetic skin ulcer initially presentiing with a local cellulitis that responded to Augmentin. Currently patient is applying Silvadene cream daily to ulcer, wearing a toe prop and wearing a surgical shoe  Objective: Patient presents without toe prop on the right foot Chronic edema right ankle and dorsal right foot DP and PT pulses trace palpable bilaterally The distal second right toe as a 3 mm ulcer with a granular base that does not probe deep. There is local erythema at the level of the distal interphalangeal joint  Assessment: Peripheral edema rule out DVT right lower extremity Decrease pedal pulses Noninfected skin ulcer distal second right toe  Plan: Debrided ulcer and apply Silvadene dressing Advised patient to wear toe crest on right foot Continue wearing surgical shoe on right foot  Refer patient to vascular lab for: Right lower extremity venous Doppler for persistent edema Lower extremity arterial Doppler plus TBI's for the indication of diabetic skin ulcer with decrease pedal pulses  Patient will maintain local wound care including daily application of Silvadene and gauze dressing  Reappoint 2 weeks

## 2014-12-15 ENCOUNTER — Telehealth: Payer: Self-pay | Admitting: *Deleted

## 2014-12-15 NOTE — Telephone Encounter (Signed)
Pt states she has not received a call for an appt for her leg scans.  I refaxed to the [facility] [phone] and informed pt I had refaxed.

## 2014-12-18 ENCOUNTER — Other Ambulatory Visit: Payer: Self-pay | Admitting: Interventional Cardiology

## 2014-12-18 NOTE — Telephone Encounter (Signed)
Per note 12.14.15

## 2014-12-21 ENCOUNTER — Other Ambulatory Visit (INDEPENDENT_AMBULATORY_CARE_PROVIDER_SITE_OTHER): Payer: PPO | Admitting: *Deleted

## 2014-12-21 DIAGNOSIS — E782 Mixed hyperlipidemia: Secondary | ICD-10-CM | POA: Diagnosis not present

## 2014-12-21 LAB — LDL CHOLESTEROL, DIRECT: Direct LDL: 46 mg/dL

## 2014-12-21 LAB — LIPID PANEL
Cholesterol: 141 mg/dL (ref 0–200)
HDL: 29.1 mg/dL — ABNORMAL LOW (ref >39.00)
Total CHOL/HDL Ratio: 5
Triglycerides: 442 mg/dL — ABNORMAL HIGH (ref 0.0–149.0)

## 2014-12-21 LAB — HEPATIC FUNCTION PANEL
ALT: 21 U/L (ref 0–35)
AST: 19 U/L (ref 0–37)
Albumin: 3.9 g/dL (ref 3.5–5.2)
Alkaline Phosphatase: 67 U/L (ref 39–117)
Bilirubin, Direct: 0.1 mg/dL (ref 0.0–0.3)
Total Bilirubin: 0.3 mg/dL (ref 0.2–1.2)
Total Protein: 6.7 g/dL (ref 6.0–8.3)

## 2014-12-21 NOTE — Addendum Note (Signed)
Addended by: Eulis Foster on: 12/21/2014 07:33 AM   Modules accepted: Orders

## 2014-12-22 ENCOUNTER — Ambulatory Visit (HOSPITAL_COMMUNITY): Payer: PPO | Attending: Internal Medicine

## 2014-12-22 DIAGNOSIS — R609 Edema, unspecified: Secondary | ICD-10-CM

## 2014-12-23 ENCOUNTER — Ambulatory Visit (INDEPENDENT_AMBULATORY_CARE_PROVIDER_SITE_OTHER): Payer: PPO | Admitting: Podiatry

## 2014-12-23 ENCOUNTER — Encounter: Payer: Self-pay | Admitting: Podiatry

## 2014-12-23 VITALS — Temp 95.6°F

## 2014-12-23 DIAGNOSIS — L89891 Pressure ulcer of other site, stage 1: Secondary | ICD-10-CM

## 2014-12-23 DIAGNOSIS — L97511 Non-pressure chronic ulcer of other part of right foot limited to breakdown of skin: Secondary | ICD-10-CM

## 2014-12-23 NOTE — Progress Notes (Signed)
Patient ID: [name], female   DOB: January 02, 1932, 81 y.o.   MRN: [id number] Subjective: This patient presents again for ongoing care for diabetic skin ulcer distal second right toe with a history of cellulitis that resolved with oral medication. Currently patient is applying Silvadene cream daily, wearing a toe prop, and wearing a surgical shoe. On the visit of 12/09/2014 a venous and arterial Doppler were ordered. The venous Doppler was completed and the arterial Doppler is scheduled on 12/24/2014  Objective: The right lower extremity venous Doppler was negative for deep vein thrombosis The distal second right toe demonstrates low-grade edema, with a 3 mm superficial ulcer without any active drainage. The second right toe is relatively long and there is contracture at the DIPJ  Assessment: Noninfected skin ulcer second right toe associated with contracture of DIPJ and relative length of the second right toe Pending arterial Doppler  Plan: Debrided skin ulcer second right toe and dressed with Silvadene Maintain toe crest right foot Maintain surgical shoe right foot Advised patient that because of toe left and contracture of the second right toe the lesion may not heal an amputation may be an option. Patient again like to avoid amputation  Reappoint 2 weeks

## 2014-12-23 NOTE — Patient Instructions (Signed)
Apply Silvadene cream to the skin ulcer on the second right toe daily and cover with gauze Wear toe crest on right foot to elevate toes Wear surgical shoe on right foot The results of the venous Doppler demonstrated no clots and no follow-up needed Pending arterial Doppler

## 2014-12-24 ENCOUNTER — Ambulatory Visit (HOSPITAL_COMMUNITY): Payer: PPO | Attending: Internal Medicine

## 2014-12-24 ENCOUNTER — Other Ambulatory Visit: Payer: Self-pay | Admitting: Podiatry

## 2014-12-24 DIAGNOSIS — I70203 Unspecified atherosclerosis of native arteries of extremities, bilateral legs: Secondary | ICD-10-CM | POA: Diagnosis not present

## 2014-12-24 DIAGNOSIS — E785 Hyperlipidemia, unspecified: Secondary | ICD-10-CM | POA: Insufficient documentation

## 2014-12-24 DIAGNOSIS — R609 Edema, unspecified: Secondary | ICD-10-CM | POA: Insufficient documentation

## 2014-12-24 DIAGNOSIS — E119 Type 2 diabetes mellitus without complications: Secondary | ICD-10-CM | POA: Diagnosis not present

## 2014-12-24 DIAGNOSIS — I739 Peripheral vascular disease, unspecified: Secondary | ICD-10-CM

## 2014-12-24 DIAGNOSIS — I10 Essential (primary) hypertension: Secondary | ICD-10-CM | POA: Insufficient documentation

## 2014-12-24 DIAGNOSIS — L97511 Non-pressure chronic ulcer of other part of right foot limited to breakdown of skin: Secondary | ICD-10-CM | POA: Diagnosis not present

## 2014-12-24 DIAGNOSIS — I2510 Atherosclerotic heart disease of native coronary artery without angina pectoris: Secondary | ICD-10-CM | POA: Insufficient documentation

## 2014-12-28 ENCOUNTER — Telehealth: Payer: Self-pay | Admitting: *Deleted

## 2014-12-28 ENCOUNTER — Encounter: Payer: Self-pay | Admitting: Interventional Cardiology

## 2014-12-28 ENCOUNTER — Ambulatory Visit (INDEPENDENT_AMBULATORY_CARE_PROVIDER_SITE_OTHER): Payer: PPO | Admitting: Interventional Cardiology

## 2014-12-28 VITALS — BP 148/72 | HR 84 | Ht 67.75 in | Wt 182.0 lb

## 2014-12-28 DIAGNOSIS — E782 Mixed hyperlipidemia: Secondary | ICD-10-CM

## 2014-12-28 DIAGNOSIS — I10 Essential (primary) hypertension: Secondary | ICD-10-CM

## 2014-12-28 DIAGNOSIS — I739 Peripheral vascular disease, unspecified: Secondary | ICD-10-CM

## 2014-12-28 DIAGNOSIS — I2510 Atherosclerotic heart disease of native coronary artery without angina pectoris: Secondary | ICD-10-CM | POA: Diagnosis not present

## 2014-12-28 LAB — CBC WITH DIFFERENTIAL/PLATELET
Basophils Absolute: 0 10*3/uL (ref 0.0–0.1)
Basophils Relative: 0.3 % (ref 0.0–3.0)
Eosinophils Absolute: 0.2 10*3/uL (ref 0.0–0.7)
Eosinophils Relative: 2.6 % (ref 0.0–5.0)
HCT: 36.9 % (ref 36.0–46.0)
Hemoglobin: 12.4 g/dL (ref 12.0–15.0)
Lymphocytes Relative: 15.8 % (ref 12.0–46.0)
Lymphs Abs: 1.4 10*3/uL (ref 0.7–4.0)
MCHC: 33.5 g/dL (ref 30.0–36.0)
MCV: 87.2 fl (ref 78.0–100.0)
Monocytes Absolute: 0.8 10*3/uL (ref 0.1–1.0)
Monocytes Relative: 8.7 % (ref 3.0–12.0)
Neutro Abs: 6.5 10*3/uL (ref 1.4–7.7)
Neutrophils Relative %: 72.6 % (ref 43.0–77.0)
Platelets: 321 10*3/uL (ref 150.0–400.0)
RBC: 4.23 Mil/uL (ref 3.87–5.11)
RDW: 13.8 % (ref 11.5–15.5)
WBC: 9 10*3/uL (ref 4.0–10.5)

## 2014-12-28 LAB — BASIC METABOLIC PANEL
BUN: 13 mg/dL (ref 6–23)
CO2: 26 mEq/L (ref 19–32)
Calcium: 9.3 mg/dL (ref 8.4–10.5)
Chloride: 100 mEq/L (ref 96–112)
Creatinine, Ser: 0.62 mg/dL (ref 0.40–1.20)
GFR: 98.32 mL/min (ref >60.00)
Glucose, Bld: 282 mg/dL — ABNORMAL HIGH (ref 70–99)
Potassium: 4.5 mEq/L (ref 3.5–5.1)
Sodium: 134 mEq/L — ABNORMAL LOW (ref 135–145)

## 2014-12-28 LAB — APTT: aPTT: 27.2 s (ref 23.4–32.7)

## 2014-12-28 LAB — PROTIME-INR
INR: 1 ratio (ref 0.8–1.0)
Prothrombin Time: 10.9 s (ref 9.6–13.1)

## 2014-12-28 NOTE — Patient Instructions (Addendum)
Medication Instructions:   Your physician recommends that you continue on your current medications as directed. Please refer to the Current Medication list given to you today.  Labwork:  CBC W DIFF BMET PT/INR PTT   Testing/Procedures:  Your physician has requested that you have a peripheral vascular angiogram. WITH DR ARIDA ON 12/30/14  AT 10 AM   MAKE SURE  YOU ARRIVE AT THE SHORT STAY AT 8 AM   MAKE SURE YOU HAVE NOTHING EAT OR DRINK AFTER MIDNIGHT EXCEPT WATER  YOU MAY TAKE ALL YOUR MORNING  MEDS THAT MORNING WITH WATER   MAKE SURE TAKE HALF OF 30 ML (15ML)  LEVEIMIR  IN PM THE NIGHT BEFORE  MAKE SURE YOU HAVE SOMEONE TO DRIVE YOU HOME SAFE IF NEEDED  MAKE SURE YOU HAVE  A CHANGE OF CLOTHES    This exam is performed at the hospital. During this exam IV contrast is used to look at arterial blood flow. Please review the information sheet given for details.    Follow-Up:  Your physician wants you to follow-up in:  [place] will receive a reminder letter in the mail two months in advance. If you don't receive a letter, please call our office to schedule the follow-up appointment.   Any Other Special Instructions Will Be Listed Below (If Applicable).

## 2014-12-28 NOTE — Progress Notes (Signed)
Patient ID: [name], female   DOB: [birth year], 81 y.o.   MRN: [id number]     Cardiology Office Note   Date:  12/28/2014   ID:  [name], DOB Jan 30, 1932, MRN [id number]  PCP:  Mayra Neer, MD    No chief complaint on file.    Wt Readings from Last 3 Encounters:  12/28/14 182 lb (82.555 kg)  06/29/14 182 lb 1.9 oz (82.609 kg)  12/30/13 180 lb 6.4 oz (81.829 kg)       History of Present Illness: [name] is a 81 y.o. female  who had a circumflex stent placed in Jan 2014. She had a PTCA of the OM. She has a CTO of the RCA with good collaterals. She has not had anginal symptoms since that time. A few months ago, she noticed an ulcer on the tip of her second digit on the right foot. She saw a podiatrist. The ulcer was treated but did not improve. He recommended amputation of the toe per her report. She declined and has been pursuing wound care. She is also wearing a boot on her foot.   She did recently have a lower extremity arterial Doppler. This revealed multilevel disease in the right leg with the most severe disease being in the SFA. She has all 3 tibial vessels occluded. She still denied any claudication symptoms prior to the ulcer. Her only cramps in her calves occurred randomly at night. There is nothing related to walking. The ulcer was the first symptom of any significant peripheral vascular disease.  She is walking much less. She denies any chest discomfort, orthopnea, PND, dyspnea on exertion or shortness of breath. The right ankle swells. She reports having a venous Doppler which was negative for DVT. She feels that the swelling is better in the morning. She does think she may have sprained her ankle while wearing the boot.    Past Medical History  Diagnosis Date  . Complication of anesthesia   . PONV (postoperative nausea and vomiting)   . Coronary artery disease   . Hypertension   . Hypothyroidism   . Anxiety   . Shortness of breath   . Diabetes mellitus  without complication     insulin dependent  . GERD (gastroesophageal reflux disease)   . Neuromuscular disorder     muscle cramps to lower extremities  . Other primary cardiomyopathies     Past Surgical History  Procedure Laterality Date  . Tonsillectomy    . Abdominal hysterectomy    . Cholecystectomy    . Thyroid surgery      radioactive iodine   . Coronary angioplasty with stent placement  08/09/2011    DES  to mid circumflex  . Left heart catheterization with coronary angiogram N/A 08/08/2012    Procedure: LEFT HEART CATHETERIZATION WITH CORONARY ANGIOGRAM;  Surgeon: [name], MD;  Location: [facility] CATH LAB;  Service: Cardiovascular;  Laterality: N/A;     Current Outpatient Prescriptions  Medication Sig Dispense Refill  . ACCU-CHEK AVIVA PLUS test strip As directed    . amLODipine (NORVASC) 10 MG tablet Take 1 tablet (10 mg total) by mouth daily. 90 tablet 3  . aspirin 325 MG EC tablet Take 325 mg by mouth daily.    Marland Kitchen atorvastatin (LIPITOR) 20 MG tablet Take 1 tablet (20 mg total) by mouth daily. 90 tablet 0  . carvedilol (COREG) 12.5 MG tablet Take 1 tablet (12.5 mg total) by mouth 2 (two) times daily. 60 tablet  3  . glyBURIDE (DIABETA) 5 MG tablet Take two tablets by mouth daily    . Insulin Detemir (LEVEMIR FLEXPEN [place]) Inject 30 mLs into the skin daily.     Marland Kitchen lisinopril (PRINIVIL,ZESTRIL) 20 MG tablet Take 20 mg by mouth daily.    . nitroGLYCERIN (NITROSTAT) 0.4 MG SL tablet Place 1 tablet (0.4 mg total) under the tongue every 5 (five) minutes as needed for chest pain. 25 tablet 5  . omega-3 acid ethyl esters (LOVAZA) 1 G capsule Take 2 capsules (2 g total) by mouth 2 (two) times daily. 120 capsule 6  . pantoprazole (PROTONIX) 40 MG tablet TAKE 1 TABLET BY MOUTH ONCE DAILY AT 6 AM 30 tablet 6  . PARoxetine (PAXIL) 20 MG tablet Take 20 mg by mouth every morning.    . silver sulfADIAZINE (SILVADENE) 1 % cream Apply 1 application topically daily. 50 g 0   No current  facility-administered medications for this visit.    Allergies:   Sulfa antibiotics    Social History:  The patient  reports that she has never smoked. She has never used smokeless tobacco. She reports that she drinks alcohol. She reports that she does not use illicit drugs.   Family History:  The patient's *family history includes Diabetes in her sister; Heart disease in her father.    ROS:  Please see the history of present illness.   Otherwise, review of systems are positive for foot ulcer.   All other systems are reviewed and negative.    PHYSICAL EXAM: VS:  BP 148/72 mmHg  Pulse 84  Ht 5' 7.75" (1.721 m)  Wt 182 lb (82.555 kg)  BMI 27.87 kg/m2 , BMI Body mass index is 27.87 kg/(m^2). GEN: Well nourished, well developed, in no acute distress HEENT: normal Neck: no JVD, carotid bruits, or masses Cardiac: RRR; no murmurs, rubs, or gallops, right ankle edema  Respiratory:  clear to auscultation bilaterally, normal work of breathing GI: soft, nontender, nondistended, + BS MS: no deformity or atrophy; second toe on the right foot bandage. Skin: warm and dry,  Neuro:  Strength and sensation are intact Psych: euthymic mood, full affect   EKG:   The ekg ordered today demonstrates normal sinus rhythm, right bundle branch block, left axis deviation, no significant ST segment changes   Recent Labs: 12/21/2014: ALT 21   Lipid Panel    Component Value Date/Time   CHOL 141 12/21/2014 0733   TRIG * 12/21/2014 0733    442.0 Triglyceride is over 400; calculations on Lipids are invalid.   HDL 29.10* 12/21/2014 0733   CHOLHDL 5 12/21/2014 0733   VLDL 56.6* 06/22/2014 0737   LDLCALC 24 10/20/2013 0739   LDLDIRECT 46.0 12/21/2014 0733     Other studies Reviewed: Additional studies/ records that were reviewed today with results demonstrating: Arterial and venous Doppler reviewed as noted above..   ASSESSMENT AND PLAN:  1. Peripheral arterial disease: She now has tissue loss with  this ulcer on her foot. The lower extremity arterial Doppler was reviewed with her in detail. I think the best chance of saving the toe is angiography with possible revascularization. I suspect that her SFA will require intervention. Hopefully, there will be a target for easy intervention and her toe will get better. If it does not, then in the future, may have to consider tibial vessel intervention. I discussed the case with Dr. Fletcher Anon and he is willing to do her angiogram after tomorrow. The risks and benefits of the  procedure were explained to the patient and she is willing to proceed.  I suspect he will use left groin access so that he will be able to intervene upon the right leg. 2. Coronary artery disease: No angina. Continue aggressive secondary prevention. LDL target less than 100. Her triglycerides are quite elevated on the most recent check. 3. Hypertension: Mildly elevated readings today. This may be related to pain. Continue her current medications. We can deal with her other risk factors after her peripheral arterial disease study on Wednesday. 4. She has an appointment scheduled with Dr. Fletcher Anon on June 21. We will keep this as a follow-up angiogram appointment.   Current medicines are reviewed at length with the patient today.  The patient concerns regarding her medicines were addressed.  The following changes have been made:  No change  Labs/ tests ordered today include: PV angio No orders of the defined types were placed in this encounter.    Recommend 150 minutes/week of aerobic exercise Low fat, low carb, high fiber diet recommended  Disposition:   FU in as scheduled   Teresita Madura., MD  12/28/2014 8:54 AM    [facility] [place], [place], [place]  [address] Phone: [phone]; Fax: [phone]

## 2014-12-30 ENCOUNTER — Ambulatory Visit (HOSPITAL_COMMUNITY)
Admission: RE | Admit: 2014-12-30 | Discharge: 2014-12-30 | Disposition: A | Discharge status: Home / Self Care | Payer: PPO | Source: Ambulatory Visit | Attending: Cardiovascular Disease | Admitting: Cardiovascular Disease

## 2014-12-30 ENCOUNTER — Encounter (HOSPITAL_COMMUNITY)
Admission: RE | Disposition: A | Discharge status: Home / Self Care | Payer: PPO | Source: Ambulatory Visit | Attending: Cardiovascular Disease

## 2014-12-30 DIAGNOSIS — I70202 Unspecified atherosclerosis of native arteries of extremities, left leg: Secondary | ICD-10-CM | POA: Insufficient documentation

## 2014-12-30 DIAGNOSIS — Z7982 Long term (current) use of aspirin: Secondary | ICD-10-CM | POA: Diagnosis not present

## 2014-12-30 DIAGNOSIS — I10 Essential (primary) hypertension: Secondary | ICD-10-CM | POA: Insufficient documentation

## 2014-12-30 DIAGNOSIS — F419 Anxiety disorder, unspecified: Secondary | ICD-10-CM | POA: Diagnosis not present

## 2014-12-30 DIAGNOSIS — E11621 Type 2 diabetes mellitus with foot ulcer: Secondary | ICD-10-CM | POA: Insufficient documentation

## 2014-12-30 DIAGNOSIS — I428 Other cardiomyopathies: Secondary | ICD-10-CM | POA: Insufficient documentation

## 2014-12-30 DIAGNOSIS — I739 Peripheral vascular disease, unspecified: Secondary | ICD-10-CM | POA: Diagnosis present

## 2014-12-30 DIAGNOSIS — K219 Gastro-esophageal reflux disease without esophagitis: Secondary | ICD-10-CM | POA: Insufficient documentation

## 2014-12-30 DIAGNOSIS — Z794 Long term (current) use of insulin: Secondary | ICD-10-CM | POA: Diagnosis not present

## 2014-12-30 DIAGNOSIS — L97519 Non-pressure chronic ulcer of other part of right foot with unspecified severity: Secondary | ICD-10-CM | POA: Insufficient documentation

## 2014-12-30 DIAGNOSIS — Z955 Presence of coronary angioplasty implant and graft: Secondary | ICD-10-CM | POA: Diagnosis not present

## 2014-12-30 DIAGNOSIS — I701 Atherosclerosis of renal artery: Secondary | ICD-10-CM | POA: Diagnosis not present

## 2014-12-30 DIAGNOSIS — I70235 Atherosclerosis of native arteries of right leg with ulceration of other part of foot: Secondary | ICD-10-CM | POA: Insufficient documentation

## 2014-12-30 DIAGNOSIS — I2510 Atherosclerotic heart disease of native coronary artery without angina pectoris: Secondary | ICD-10-CM | POA: Insufficient documentation

## 2014-12-30 DIAGNOSIS — E039 Hypothyroidism, unspecified: Secondary | ICD-10-CM | POA: Insufficient documentation

## 2014-12-30 DIAGNOSIS — I70213 Atherosclerosis of native arteries of extremities with intermittent claudication, bilateral legs: Secondary | ICD-10-CM | POA: Diagnosis not present

## 2014-12-30 HISTORY — PX: PERIPHERAL VASCULAR CATHETERIZATION: SHX172C

## 2014-12-30 LAB — GLUCOSE, CAPILLARY
Glucose-Capillary: 338 mg/dL — ABNORMAL HIGH (ref 65–99)
Glucose-Capillary: 309 mg/dL — ABNORMAL HIGH (ref 65–99)
Glucose-Capillary: 279 mg/dL — ABNORMAL HIGH (ref 65–99)

## 2014-12-30 LAB — POCT ACTIVATED CLOTTING TIME: Activated Clotting Time: 485 seconds

## 2014-12-30 SURGERY — LOWER EXTREMITY ANGIOGRAPHY

## 2014-12-30 MED ORDER — SODIUM CHLORIDE 0.9 % WEIGHT BASED INFUSION
3.0000 mL/kg/h | INTRAVENOUS | Status: AC
  Administered 2014-12-30: 3 mL/kg/h via INTRAVENOUS

## 2014-12-30 MED ORDER — ASPIRIN 81 MG PO CHEW
CHEWABLE_TABLET | ORAL | Status: AC
  Filled 2014-12-30: qty 1

## 2014-12-30 MED ORDER — FENTANYL CITRATE (PF) 100 MCG/2ML IJ SOLN
INTRAMUSCULAR | Status: DC | PRN
  Administered 2014-12-30: 25 ug via INTRAVENOUS

## 2014-12-30 MED ORDER — FENTANYL CITRATE (PF) 100 MCG/2ML IJ SOLN
INTRAMUSCULAR | Status: AC
  Filled 2014-12-30: qty 2

## 2014-12-30 MED ORDER — LIDOCAINE HCL (PF) 1 % IJ SOLN
INTRAMUSCULAR | Status: AC
  Filled 2014-12-30: qty 30

## 2014-12-30 MED ORDER — SODIUM CHLORIDE 0.9 % WEIGHT BASED INFUSION: 1.0000 mL/kg/h | INTRAVENOUS | Status: DC

## 2014-12-30 MED ORDER — SODIUM CHLORIDE 0.9 % WEIGHT BASED INFUSION: 1.0000 mL/kg/h | INTRAVENOUS | Status: AC

## 2014-12-30 MED ORDER — MIDAZOLAM HCL 2 MG/2ML IJ SOLN
INTRAMUSCULAR | Status: AC
  Filled 2014-12-30: qty 2

## 2014-12-30 MED ORDER — SODIUM CHLORIDE 0.9 % IJ SOLN: 3.0000 mL | INTRAMUSCULAR | Status: DC | PRN

## 2014-12-30 MED ORDER — ASPIRIN 81 MG PO CHEW
81.0000 mg | CHEWABLE_TABLET | ORAL | Status: AC
  Administered 2014-12-30: 81 mg via ORAL

## 2014-12-30 MED ORDER — SODIUM CHLORIDE 0.9 % IJ SOLN
3.0000 mL | INTRAMUSCULAR | Status: DC | PRN
Start: 2014-12-30 — End: 2014-12-30

## 2014-12-30 MED ORDER — HEPARIN (PORCINE) IN NACL 2-0.9 UNIT/ML-% IJ SOLN
INTRAMUSCULAR | Status: AC
  Filled 2014-12-30: qty 1000

## 2014-12-30 MED ORDER — SODIUM CHLORIDE 0.9 % IJ SOLN: 3.0000 mL | Freq: Two times a day (BID) | INTRAMUSCULAR | Status: DC

## 2014-12-30 MED ORDER — LIDOCAINE HCL (PF) 1 % IJ SOLN
INTRAMUSCULAR | Status: DC | PRN
  Administered 2014-12-30: 20 mL via INTRADERMAL

## 2014-12-30 MED ORDER — SODIUM CHLORIDE 0.9 % IV SOLN: 250.0000 mL | INTRAVENOUS | Status: DC | PRN

## 2014-12-30 MED ORDER — MIDAZOLAM HCL 2 MG/2ML IJ SOLN
INTRAMUSCULAR | Status: DC | PRN
  Administered 2014-12-30: 1 mg via INTRAVENOUS

## 2014-12-30 MED ORDER — IODIXANOL 320 MG/ML IV SOLN
INTRAVENOUS | Status: DC | PRN
  Administered 2014-12-30: 125 mL via INTRAVENOUS

## 2014-12-30 SURGICAL SUPPLY — 13 items
CATH ANGIO 5F PIGTAIL 65CM (CATHETERS) ×1 IMPLANT
CATH CROSS OVER TEMPO 5F (CATHETERS) ×1 IMPLANT
CATH STRAIGHT 5FR 65CM (CATHETERS) ×1 IMPLANT
GUIDEWIRE ANGLED .035X150CM (WIRE) ×1 IMPLANT
HAND CONTROLLER AVANTA (MISCELLANEOUS) ×1 IMPLANT
KIT PV (KITS) ×3 IMPLANT
SET AVANTA MULTI PATIENT (MISCELLANEOUS) IMPLANT
SET AVANTA SINGLE PATIENT (MISCELLANEOUS) IMPLANT
SHEATH AVANTA HAND CONTROLLER (MISCELLANEOUS) ×1 IMPLANT
SHEATH PINNACLE 5F 10CM (SHEATH) ×1 IMPLANT
TRANSDUCER W/STOPCOCK (MISCELLANEOUS) ×3 IMPLANT
TRAY PV CATH (CUSTOM PROCEDURE TRAY) ×3 IMPLANT
WIRE HITORQ VERSACORE ST 145CM (WIRE) ×1 IMPLANT

## 2014-12-30 NOTE — Progress Notes (Signed)
Site area: left groin Site Prior to Removal:  Level  0 Pressure Applied For:  20 minutes Manual:   yes Patient Status During Pull:  stable Post Pull Site:  Level  0 Post Pull Instructions Given:  yes Post Pull Pulses Present: yes Dressing Applied:  tegaderm Bedrest begins @ 1210 Comments:  none

## 2014-12-30 NOTE — Interval H&P Note (Signed)
History and Physical Interval Note:  12/30/2014 10:27 AM  [name]  has presented today for surgery, with the diagnosis of pad/with lower extremity ulcer  The various methods of treatment have been discussed with the patient and family. After consideration of risks, benefits and other options for treatment, the patient has consented to  Procedure(s): Lower Extremity Angiography (N/A) as a surgical intervention .  The patient's history has been reviewed, patient examined, no change in status, stable for surgery.  I have reviewed the patient's chart and labs.  Questions were answered to the patient's satisfaction.     Kathlyn Sacramento

## 2014-12-30 NOTE — H&P (View-Only) (Signed)
Patient ID: [name], female   DOB: 1932/06/26, 81 y.o.   MRN: [id number]     Cardiology Office Note   Date:  12/28/2014   ID:  [name], DOB 1931/11/26, MRN [id number]  PCP:  Mayra Neer, MD    No chief complaint on file.    Wt Readings from Last 3 Encounters:  12/28/14 182 lb (82.555 kg)  06/29/14 182 lb 1.9 oz (82.609 kg)  12/30/13 180 lb 6.4 oz (81.829 kg)       History of Present Illness: [name] is a 81 y.o. female  who had a circumflex stent placed in Jan 2014. She had a PTCA of the OM. She has a CTO of the RCA with good collaterals. She has not had anginal symptoms since that time. A few months ago, she noticed an ulcer on the tip of her second digit on the right foot. She saw a podiatrist. The ulcer was treated but did not improve. He recommended amputation of the toe per her report. She declined and has been pursuing wound care. She is also wearing a boot on her foot.   She did recently have a lower extremity arterial Doppler. This revealed multilevel disease in the right leg with the most severe disease being in the SFA. She has all 3 tibial vessels occluded. She still denied any claudication symptoms prior to the ulcer. Her only cramps in her calves occurred randomly at night. There is nothing related to walking. The ulcer was the first symptom of any significant peripheral vascular disease.  She is walking much less. She denies any chest discomfort, orthopnea, PND, dyspnea on exertion or shortness of breath. The right ankle swells. She reports having a venous Doppler which was negative for DVT. She feels that the swelling is better in the morning. She does think she may have sprained her ankle while wearing the boot.    Past Medical History  Diagnosis Date  . Complication of anesthesia   . PONV (postoperative nausea and vomiting)   . Coronary artery disease   . Hypertension   . Hypothyroidism   . Anxiety   . Shortness of breath   . Diabetes mellitus  without complication     insulin dependent  . GERD (gastroesophageal reflux disease)   . Neuromuscular disorder     muscle cramps to lower extremities  . Other primary cardiomyopathies     Past Surgical History  Procedure Laterality Date  . Tonsillectomy    . Abdominal hysterectomy    . Cholecystectomy    . Thyroid surgery      radioactive iodine   . Coronary angioplasty with stent placement  08/09/2011    DES  to mid circumflex  . Left heart catheterization with coronary angiogram N/A 08/08/2012    Procedure: LEFT HEART CATHETERIZATION WITH CORONARY ANGIOGRAM;  Surgeon: [name], MD;  Location: [facility] CATH LAB;  Service: Cardiovascular;  Laterality: N/A;     Current Outpatient Prescriptions  Medication Sig Dispense Refill  . ACCU-CHEK AVIVA PLUS test strip As directed    . amLODipine (NORVASC) 10 MG tablet Take 1 tablet (10 mg total) by mouth daily. 90 tablet 3  . aspirin 325 MG EC tablet Take 325 mg by mouth daily.    Marland Kitchen atorvastatin (LIPITOR) 20 MG tablet Take 1 tablet (20 mg total) by mouth daily. 90 tablet 0  . carvedilol (COREG) 12.5 MG tablet Take 1 tablet (12.5 mg total) by mouth 2 (two) times daily. 60 tablet  3  . glyBURIDE (DIABETA) 5 MG tablet Take two tablets by mouth daily    . Insulin Detemir (LEVEMIR FLEXPEN [place]) Inject 30 mLs into the skin daily.     Marland Kitchen lisinopril (PRINIVIL,ZESTRIL) 20 MG tablet Take 20 mg by mouth daily.    . nitroGLYCERIN (NITROSTAT) 0.4 MG SL tablet Place 1 tablet (0.4 mg total) under the tongue every 5 (five) minutes as needed for chest pain. 25 tablet 5  . omega-3 acid ethyl esters (LOVAZA) 1 G capsule Take 2 capsules (2 g total) by mouth 2 (two) times daily. 120 capsule 6  . pantoprazole (PROTONIX) 40 MG tablet TAKE 1 TABLET BY MOUTH ONCE DAILY AT 6 AM 30 tablet 6  . PARoxetine (PAXIL) 20 MG tablet Take 20 mg by mouth every morning.    . silver sulfADIAZINE (SILVADENE) 1 % cream Apply 1 application topically daily. 50 g 0   No current  facility-administered medications for this visit.    Allergies:   Sulfa antibiotics    Social History:  The patient  reports that she has never smoked. She has never used smokeless tobacco. She reports that she drinks alcohol. She reports that she does not use illicit drugs.   Family History:  The patient's *family history includes Diabetes in her sister; Heart disease in her father.    ROS:  Please see the history of present illness.   Otherwise, review of systems are positive for foot ulcer.   All other systems are reviewed and negative.    PHYSICAL EXAM: VS:  BP 148/72 mmHg  Pulse 84  Ht 5' 7.75" (1.721 m)  Wt 182 lb (82.555 kg)  BMI 27.87 kg/m2 , BMI Body mass index is 27.87 kg/(m^2). GEN: Well nourished, well developed, in no acute distress HEENT: normal Neck: no JVD, carotid bruits, or masses Cardiac: RRR; no murmurs, rubs, or gallops, right ankle edema  Respiratory:  clear to auscultation bilaterally, normal work of breathing GI: soft, nontender, nondistended, + BS MS: no deformity or atrophy; second toe on the right foot bandage. Skin: warm and dry,  Neuro:  Strength and sensation are intact Psych: euthymic mood, full affect   EKG:   The ekg ordered today demonstrates normal sinus rhythm, right bundle branch block, left axis deviation, no significant ST segment changes   Recent Labs: 12/21/2014: ALT 21   Lipid Panel    Component Value Date/Time   CHOL 141 12/21/2014 0733   TRIG * 12/21/2014 0733    442.0 Triglyceride is over 400; calculations on Lipids are invalid.   HDL 29.10* 12/21/2014 0733   CHOLHDL 5 12/21/2014 0733   VLDL 56.6* 06/22/2014 0737   LDLCALC 24 10/20/2013 0739   LDLDIRECT 46.0 12/21/2014 0733     Other studies Reviewed: Additional studies/ records that were reviewed today with results demonstrating: Arterial and venous Doppler reviewed as noted above..   ASSESSMENT AND PLAN:  1. Peripheral arterial disease: She now has tissue loss with  this ulcer on her foot. The lower extremity arterial Doppler was reviewed with her in detail. I think the best chance of saving the toe is angiography with possible revascularization. I suspect that her SFA will require intervention. Hopefully, there will be a target for easy intervention and her toe will get better. If it does not, then in the future, may have to consider tibial vessel intervention. I discussed the case with Dr. Fletcher Anon and he is willing to do her angiogram after tomorrow. The risks and benefits of the  procedure were explained to the patient and she is willing to proceed.  I suspect he will use left groin access so that he will be able to intervene upon the right leg. 2. Coronary artery disease: No angina. Continue aggressive secondary prevention. LDL target less than 100. Her triglycerides are quite elevated on the most recent check. 3. Hypertension: Mildly elevated readings today. This may be related to pain. Continue her current medications. We can deal with her other risk factors after her peripheral arterial disease study on Wednesday. 4. She has an appointment scheduled with Dr. Fletcher Anon on June 21. We will keep this as a follow-up angiogram appointment.   Current medicines are reviewed at length with the patient today.  The patient concerns regarding her medicines were addressed.  The following changes have been made:  No change  Labs/ tests ordered today include: PV angio No orders of the defined types were placed in this encounter.    Recommend 150 minutes/week of aerobic exercise Low fat, low carb, high fiber diet recommended  Disposition:   FU in as scheduled   Teresita Madura., MD  12/28/2014 8:54 AM    [facility] [place], [place], [place]  [address] Phone: [phone]; Fax: [phone]

## 2014-12-30 NOTE — Discharge Instructions (Signed)
Angiogram, Care After Refer to this sheet in the next few weeks. These instructions provide you with information on caring for yourself after your procedure. Your health care provider may also give you more specific instructions. Your treatment has been planned according to current medical practices, but problems sometimes occur. Call your health care provider if you have any problems or questions after your procedure.  WHAT TO EXPECT AFTER THE PROCEDURE After your procedure, it is typical to have the following sensations:  Minor discomfort or tenderness and a small bump at the catheter insertion site. The bump should usually decrease in size and tenderness within 1 to 2 weeks.  Any bruising will usually fade within 2 to 4 weeks. HOME CARE INSTRUCTIONS   You may need to keep taking blood thinners if they were prescribed for you. Take medicines only as directed by your health care provider.  Do not apply powder or lotion to the site.  Do not take baths, swim, or use a hot tub until your health care provider approves.  You may shower 24 hours after the procedure. Remove the bandage (dressing) and gently wash the site with plain soap and water. Gently pat the site dry.  Inspect the site at least twice daily.  Limit your activity for the first 48 hours. Do not bend, squat, or lift anything over 10 lb (9 kg) or as directed by your health care provider.  Plan to have someone take you home after the procedure. Follow instructions about when you can drive or return to work. SEEK MEDICAL CARE IF:  You get light-headed when standing up.  You have drainage (other than a small amount of blood on the dressing).  You have chills.  You have a fever.  You have redness, warmth, swelling, or pain at the insertion site. SEEK IMMEDIATE MEDICAL CARE IF:   You develop chest pain or shortness of breath, feel faint, or pass out.  You have bleeding, swelling larger than a walnut, or drainage from the  catheter insertion site.  You develop pain, discoloration, coldness, or severe bruising in the leg or arm that held the catheter.  You develop bleeding from any other place, such as the bowels. You may see bright red blood in your urine or stools, or your stools may appear black and tarry.  You have heavy bleeding from the site. If this happens, hold pressure on the site. MAKE SURE YOU:  Understand these instructions.  Will watch your condition.  Will get help right away if you are not doing well or get worse. Document Released: 01/19/2005 Document Revised: 11/17/2013 Document Reviewed: 11/25/2012 [facility] Patient Information 2015 [place], [place]. This information is not intended to replace advice given to you by your health care provider. Make sure you discuss any questions you have with your health care provider.

## 2014-12-30 NOTE — Progress Notes (Signed)
CBG [id number]; Genene Churn, RN notified

## 2014-12-31 ENCOUNTER — Encounter: Payer: Self-pay | Admitting: *Deleted

## 2014-12-31 ENCOUNTER — Encounter (HOSPITAL_COMMUNITY): Payer: Self-pay | Admitting: Cardiovascular Disease

## 2014-12-31 MED FILL — Heparin Sodium (Porcine) 2 Unit/ML in Sodium Chloride 0.9%: INTRAMUSCULAR | Qty: 1000 | Status: AC

## 2015-01-05 ENCOUNTER — Telehealth: Payer: Self-pay | Admitting: *Deleted

## 2015-01-05 ENCOUNTER — Ambulatory Visit (INDEPENDENT_AMBULATORY_CARE_PROVIDER_SITE_OTHER): Payer: PPO | Admitting: Cardiovascular Disease

## 2015-01-05 ENCOUNTER — Encounter: Payer: Self-pay | Admitting: Cardiovascular Disease

## 2015-01-05 VITALS — BP 140/74 | HR 82 | Ht 68.0 in | Wt 179.8 lb

## 2015-01-05 DIAGNOSIS — I739 Peripheral vascular disease, unspecified: Secondary | ICD-10-CM | POA: Diagnosis not present

## 2015-01-05 NOTE — Patient Instructions (Signed)
Medication Instructions:  Your physician recommends that you continue on your current medications as directed. Please refer to the Current Medication list given to you today.  Labwork: No new orders.   Testing/Procedures: No new orders.   Follow-Up: Your physician wants you to follow-up in: 6 MONTHS with Dr Fletcher Anon.  You will receive a reminder letter in the mail two months in advance. If you don't receive a letter, please call our office to schedule the follow-up appointment.   Any Other Special Instructions Will Be Listed Below (If Applicable).

## 2015-01-05 NOTE — Progress Notes (Signed)
Patient ID: [name], female   DOB: [birth year], 81 y.o.   MRN: [id number]     Cardiology Office Note   Date:  01/05/2015   ID:  [name], DOB 07-Mar-1932, MRN [id number]  PCP:  Pcp Not In System   Primary cardiologist: Dr. Briscoe Deutscher Readings from Last 3 Encounters:  01/05/15 179 lb 12.8 oz (81.557 kg)  12/30/14 182 lb (82.555 kg)  12/28/14 182 lb (82.555 kg)       History of Present Illness: [name] is a 81 y.o. female  Who is here today for a follow-up visit regarding peripheral arterial disease. She has known history of coronary artery disease with previous stenting of the circumflex  in Jan 2014. She had a PTCA of the OM. She has a CTO of the RCA with good collaterals.  She noticed an ulcer on the tip of her second digit on the right foot. She saw a podiatrist. The ulcer was treated but did not improve. He recommended amputation of the toe per her report. She declined and has been pursuing wound care.    She did recently have a lower extremity arterial Doppler. This revealed multilevel disease in the right leg with the most severe disease being in the SFA. She has all 3 tibial vessels occluded. She still denied any claudication symptoms prior to the ulcer. Her only cramps in her calves occurred randomly at night. There is nothing related to walking. The ulcer was the first symptom of any significant peripheral vascular disease.  I proceeded with lower extremity angiography last week which showed no significant aortoiliac disease. There was no significant disease affecting the right SFA and popliteal arteries. There was one-vessel runoff below the knee via the peroneal artery with good reconstitution of the pedal arch. On the left side, there was significant focal left SFA stenosis in 2 different locations. There was one-vessel runoff below the knee. No revascularization was needed. She denies claudication. The ulceration on the right secondary toe does not seem any  worse.    Past Medical History  Diagnosis Date  . Complication of anesthesia   . PONV (postoperative nausea and vomiting)   . Coronary artery disease   . Hypertension   . Hypothyroidism   . Anxiety   . Shortness of breath   . Diabetes mellitus without complication     insulin dependent  . GERD (gastroesophageal reflux disease)   . Neuromuscular disorder     muscle cramps to lower extremities  . Other primary cardiomyopathies     Past Surgical History  Procedure Laterality Date  . Tonsillectomy    . Abdominal hysterectomy    . Cholecystectomy    . Thyroid surgery      radioactive iodine   . Coronary angioplasty with stent placement  08/09/2011    DES  to mid circumflex  . Left heart catheterization with coronary angiogram N/A 08/08/2012    Procedure: LEFT HEART CATHETERIZATION WITH CORONARY ANGIOGRAM;  Surgeon: [name], MD;  Location: [facility] C[facility] LAB;  Service: Cardiovascular;  Laterality: N/A;  . Peripheral vascular catheterization N/A 12/30/2014    Procedure: Lower Extremity Angiography;  Surgeon: Wellington Hampshire, MD;  Location: [facility] LAB;  Service: Cardiovascular;  Laterality: N/A;     Current Outpatient Prescriptions  Medication Sig Dispense Refill  . amLODipine (NORVASC) 10 MG tablet Take 1 tablet (10 mg total) by mouth daily. 90 tablet 3  . aspirin 325 MG EC tablet  Take 325 mg by mouth at bedtime.     Marland Kitchen atorvastatin (LIPITOR) 20 MG tablet Take 1 tablet (20 mg total) by mouth daily. 90 tablet 0  . carvedilol (COREG) 12.5 MG tablet Take 12.5 mg by mouth 2 (two) times daily with a meal.    . glyBURIDE (DIABETA) 5 MG tablet Take 5 mg by mouth at bedtime.     . Insulin Detemir (LEVEMIR FLEXPEN) 100 UNIT/ML Pen Inject 30 Units into the skin at bedtime.    Marland Kitchen lisinopril (PRINIVIL,ZESTRIL) 20 MG tablet Take 20 mg by mouth daily with supper.     . nitroGLYCERIN (NITROSTAT) 0.4 MG SL tablet Place 1 tablet (0.4 mg total) under the tongue every 5 (five) minutes  as needed for chest pain. 25 tablet 5  . Omega-3 Fatty Acids (FISH OIL) 1200 MG CAPS Take 2,400 mg by mouth 2 (two) times daily.    . pantoprazole (PROTONIX) 40 MG tablet Take 40 mg by mouth daily before breakfast.    . PARoxetine (PAXIL) 20 MG tablet Take 20 mg by mouth daily with breakfast.      No current facility-administered medications for this visit.    Allergies:   Sulfa antibiotics    Social History:  The patient  reports that she has never smoked. She has never used smokeless tobacco. She reports that she drinks alcohol. She reports that she does not use illicit drugs.   Family History:  The patient's *family history includes Diabetes in her sister; Heart disease in her father.    ROS:  Please see the history of present illness.   Otherwise, review of systems are positive for foot ulcer.   All other systems are reviewed and negative.    PHYSICAL EXAM: VS:  BP 140/74 mmHg  Pulse 82  Ht 5' 8"$  (1.727 m)  Wt 179 lb 12.8 oz (81.557 kg)  BMI 27.34 kg/m2  SpO2 96% , BMI Body mass index is 27.34 kg/(m^2). GEN: Well nourished, well developed, in no acute distress HEENT: normal Neck: no JVD, carotid bruits, or masses Cardiac: RRR; no murmurs, rubs, or gallops, right ankle edema  Respiratory:  clear to auscultation bilaterally, normal work of breathing GI: soft, nontender, nondistended, + BS MS: no deformity or atrophy; second toe on the right foot bandage. Skin: warm and dry,  Neuro:  Strength and sensation are intact Psych: euthymic mood, full affect Left groin with no hematoma.     Recent Labs: 12/21/2014: ALT 21 12/28/2014: BUN 13; Creatinine, Ser 0.62; Hemoglobin 12.4; Platelets 321.0; Potassium 4.5; Sodium 134*   Lipid Panel    Component Value Date/Time   CHOL 141 12/21/2014 0733   TRIG * 12/21/2014 0733    442.0 Triglyceride is over 400; calculations on Lipids are invalid.   HDL 29.10* 12/21/2014 0733   CHOLHDL 5 12/21/2014 0733   VLDL 56.6* 06/22/2014 0737    LDLCALC 24 10/20/2013 0739   LDLDIRECT 46.0 12/21/2014 0733     Other studies Reviewed: Additional studies/ records that were reviewed today with results demonstrating: Arterial and venous Doppler reviewed as noted above..   ASSESSMENT AND PLAN:  1. Peripheral arterial disease: She does have an ulceration on the right second toe. Recent angiography showed good blood flow or the way distally and thus no revascularization was recommended. She does have significant disease on the left side but she is asymptomatic. I recommend continuing medical therapy.  2. Coronary artery disease: No angina. Continue aggressive secondary prevention.  3. Hypertension: Blood pressure is  reasonably controlled.  4. Hyperlipidemia: Continue treatment with atorvastatin with a target LDL of less than 70.      Signed,  Kathlyn Sacramento, MD  01/05/2015 5:31 PM     [place]

## 2015-01-05 NOTE — Telephone Encounter (Addendum)
-----   Message from Lolita Rieger sent at 01/05/2015  2:06 PM EDT ----- Left message for pt to call with update after her visit with Dr. Fletcher Anon.  ----- Message -----    From: Gean Birchwood, DPM    Sent: 12/29/2014   8:02 AM      To: Lolita Rieger  Lower extremity arterial Doppler and waveforms dated 12/28/2014 Abnormal ABIs and TBI's Follow-up visits scheduled with Dr.Arida on 01/05/2015 Confirm with patient that she is aware of this appointment

## 2015-01-06 ENCOUNTER — Ambulatory Visit: Payer: PPO | Admitting: Podiatry

## 2015-01-13 ENCOUNTER — Encounter: Payer: Self-pay | Admitting: Podiatry

## 2015-01-13 ENCOUNTER — Ambulatory Visit (INDEPENDENT_AMBULATORY_CARE_PROVIDER_SITE_OTHER): Payer: PPO | Admitting: Podiatry

## 2015-01-13 VITALS — BP 151/87 | HR 90 | Resp 12

## 2015-01-13 DIAGNOSIS — L97511 Non-pressure chronic ulcer of other part of right foot limited to breakdown of skin: Secondary | ICD-10-CM

## 2015-01-13 DIAGNOSIS — L89891 Pressure ulcer of other site, stage 1: Secondary | ICD-10-CM

## 2015-01-13 NOTE — Progress Notes (Signed)
Patient ID: [name], female   DOB: [birth year], 81 y.o.   MRN: [id number]  Subjective: This patient presents for follow-up care for ongoing care for diabetic skin ulcer distal second right toe with a history of cellulitis: Under our care since 10/21/2014. Currently patient is applying Silvadene cream and wearing a toe prop. Since the visit of 12/23/2014 patient discontinue wearing the surgical shoe as instructed because he said it irritated her foot. She's undergone a venous Doppler which was negative for DVT and the arterial Doppler which had follow-up by Dr.Arida who did not recommend any surgical intervention. She also states she has a pending second opinion with Dr.Duda this week  Objective: Distal second right toe has low-grade erythema and edema with the skin ulcer measuring 2 mm in diameter that is superficial. There is no active drainage, malodor, warmth noted. The distal interphalangeal joint remains contracted  Assessment: Persistent skin ulcer distal right toe the clinically does not appear to be infected Contracture DIPJ second right toe which is contributing to the pressure on the second right toe  Plan: Debrided skin ulcer and apply Silvadene dressing Maintain's toe crest right foot I made patient aware that she should wear the surgical shoe Patient will keep appointment with second opinion Dr. and otherwise reevaluate her 2 weeks  Maintain daily application of Silvadene and limit standing and walking

## 2015-01-13 NOTE — Patient Instructions (Signed)
Continue to wear toe prop on right foot daily Continue to apply Silvadene cream to ulcer on right foot I prefer that you wear the surgical shoe on the right foot I understand you have a second surgical opinion pending with Dr. Sharol Given

## 2015-01-26 ENCOUNTER — Ambulatory Visit: Payer: PPO | Admitting: Podiatry

## 2015-02-03 ENCOUNTER — Ambulatory Visit: Payer: PPO | Admitting: Podiatry

## 2015-06-14 ENCOUNTER — Other Ambulatory Visit: Payer: Self-pay | Admitting: Interventional Cardiology

## 2015-07-06 ENCOUNTER — Ambulatory Visit (INDEPENDENT_AMBULATORY_CARE_PROVIDER_SITE_OTHER): Payer: PPO | Admitting: Cardiovascular Disease

## 2015-07-06 ENCOUNTER — Encounter: Payer: Self-pay | Admitting: Cardiovascular Disease

## 2015-07-06 VITALS — BP 152/60 | HR 90 | Ht 66.0 in | Wt 180.4 lb

## 2015-07-06 DIAGNOSIS — I739 Peripheral vascular disease, unspecified: Secondary | ICD-10-CM

## 2015-07-06 DIAGNOSIS — I10 Essential (primary) hypertension: Secondary | ICD-10-CM

## 2015-07-06 DIAGNOSIS — E785 Hyperlipidemia, unspecified: Secondary | ICD-10-CM | POA: Diagnosis not present

## 2015-07-06 MED ORDER — CARVEDILOL 25 MG PO TABS: 25.0000 mg | ORAL_TABLET | Freq: Two times a day (BID) | ORAL | Status: DC

## 2015-07-06 NOTE — Progress Notes (Signed)
Patient ID: [name], female   DOB: [birth year], 81 y.o.   MRN: [id number]     Cardiology Office Note   Date:  07/06/2015   ID:  Donielle, Falen 06/07/1932, MRN [id number]  PCP:  Marton Redwood, MD   Primary cardiologist: Dr. Briscoe Deutscher Readings from Last 3 Encounters:  07/06/15 180 lb 6.4 oz (81.829 kg)  01/05/15 179 lb 12.8 oz (81.557 kg)  12/30/14 182 lb (82.555 kg)       History of Present Illness: ANEDRA CAPO is a 81 y.o. female  Who is here today for a follow-up visit regarding peripheral arterial disease. She has known history of coronary artery disease with previous stenting of the circumflex  in Jan 2014. She had a PTCA of the OM. She has a CTO of the RCA with good collaterals.  She was seen in June of this year for slow healing ulcer on the right second toe.  Lower extremity angiography showed no significant aortoiliac disease. There was no significant disease affecting the right SFA and popliteal arteries. There was one-vessel runoff below the knee via the peroneal artery with good reconstitution of the pedal arch. On the left side, there was significant focal left SFA stenosis in 2 different locations. There was one-vessel runoff below the knee. No revascularization was needed. The ulcer is almost completely healed. She has mild left calf claudication which is not lifestyle limiting. No chest pain or shortness of breath.    Past Medical History  Diagnosis Date  . Complication of anesthesia   . PONV (postoperative nausea and vomiting)   . Coronary artery disease   . Hypertension   . Hypothyroidism   . Anxiety   . Shortness of breath   . Diabetes mellitus without complication (HCC)     insulin dependent  . GERD (gastroesophageal reflux disease)   . Neuromuscular disorder (HCC)     muscle cramps to lower extremities  . Other primary cardiomyopathies     Past Surgical History  Procedure Laterality Date  . Tonsillectomy    . Abdominal hysterectomy      . Cholecystectomy    . Thyroid surgery      radioactive iodine   . Coronary angioplasty with stent placement  08/09/2011    DES  to mid circumflex  . Left heart catheterization with coronary angiogram N/A 08/08/2012    Procedure: LEFT HEART CATHETERIZATION WITH CORONARY ANGIOGRAM;  Surgeon: [name], MD;  Location: [facility] CATH LAB;  Service: Cardiovascular;  Laterality: N/A;  . Peripheral vascular catheterization N/A 12/30/2014    Procedure: Lower Extremity Angiography;  Surgeon: Wellington Hampshire, MD;  Location: [facility] [facility];  Service: Cardiovascular;  Laterality: N/A;     Current Outpatient Prescriptions  Medication Sig Dispense Refill  . amLODipine (NORVASC) 10 MG tablet Take 1 tablet (10 mg total) by mouth daily. 90 tablet 3  . aspirin 325 MG EC tablet Take 325 mg by mouth at bedtime.     Marland Kitchen atorvastatin (LIPITOR) 20 MG tablet Take 1 tablet (20 mg total) by mouth daily. 90 tablet 0  . B Complex-C (SUPER B COMPLEX PO) Take 1 tablet by mouth daily.    . carvedilol (COREG) 12.5 MG tablet Take 12.5 mg by mouth 2 (two) times daily with a meal.    . Cholecalciferol (VITAMIN D PO) Take 1 tablet by mouth daily.    Marland Kitchen glyBURIDE (DIABETA) 5 MG tablet Take 5 mg by mouth at bedtime.     Marland Kitchen  Insulin Detemir (LEVEMIR FLEXPEN) 100 UNIT/ML Pen Inject 30 Units into the skin at bedtime.    Marland Kitchen lisinopril (PRINIVIL,ZESTRIL) 20 MG tablet Take 20 mg by mouth daily with supper.     . Multiple Vitamin (MULTIVITAMIN) capsule Take 1 capsule by mouth daily.    Marland Kitchen NITROSTAT 0.4 MG SL tablet PLACE 1 TABLET UNDER THE TONGUE EVERY 5 MINUTES AS NEEDED FOR CHEST PAIN 25 tablet 1  . Omega-3 Fatty Acids (FISH OIL) 1200 MG CAPS Take 2,400 mg by mouth 2 (two) times daily.    . pantoprazole (PROTONIX) 40 MG tablet Take 40 mg by mouth daily before breakfast.    . PARoxetine (PAXIL) 20 MG tablet Take 20 mg by mouth daily with breakfast.     . VITAMIN E PO Take 1 tablet by mouth daily.     No current  facility-administered medications for this visit.    Allergies:   Sulfa antibiotics    Social History:  The patient  reports that she has never smoked. She has never used smokeless tobacco. She reports that she drinks alcohol. She reports that she does not use illicit drugs.   Family History:  The patient's *family history includes Diabetes in her sister; Heart disease in her father.    ROS:  Please see the history of present illness.   Otherwise, review of systems are positive for foot ulcer.   All other systems are reviewed and negative.    PHYSICAL EXAM: VS:  BP 152/60 mmHg  Pulse 90  Ht 5\' 6"  (1.676 m)  Wt 180 lb 6.4 oz (81.829 kg)  BMI 29.13 kg/m2 , BMI Body mass index is 29.13 kg/(m^2). GEN: Well nourished, well developed, in no acute distress HEENT: normal Neck: no JVD, carotid bruits, or masses Cardiac: RRR; no murmurs, rubs, or gallops, right ankle edema  Respiratory:  clear to auscultation bilaterally, normal work of breathing GI: soft, nontender, nondistended, + BS MS: no deformity or atrophy; second toe on the right foot bandage. Skin: warm and dry,  Neuro:  Strength and sensation are intact Psych: euthymic mood, full affect Left groin with no hematoma.     Recent Labs: 12/21/2014: ALT 21 12/28/2014: BUN 13; Creatinine, Ser 0.62; Hemoglobin 12.4; Platelets 321.0; Potassium 4.5; Sodium 134*   Lipid Panel    Component Value Date/Time   CHOL 141 12/21/2014 0733   TRIG * 12/21/2014 0733    442.0 Triglyceride is over 400; calculations on Lipids are invalid.   HDL 29.10* 12/21/2014 0733   CHOLHDL 5 12/21/2014 0733   VLDL 56.6* 06/22/2014 0737   LDLCALC 24 10/20/2013 0739   LDLDIRECT 46.0 12/21/2014 0733     Other studies Reviewed: Additional studies/ records that were reviewed today with results demonstrating: Arterial and venous Doppler reviewed as noted above..   ASSESSMENT AND PLAN:  1. Peripheral arterial disease: Ulceration on the right second toe has  healed.  She does have left SFA disease with mild left calf claudication. This is currently not lifestyle limiting and thus I recommend continued medical therapy.  2. Coronary artery disease: No angina. Continue aggressive secondary prevention.  3. Hypertension: Blood pressure is elevated and thus I increased the dose of carvedilol to 25 mg twice daily. 4. Hyperlipidemia: Continue treatment with atorvastatin with a target LDL of less than 70.      Signed,  Kathlyn Sacramento, MD  07/06/2015 9:00 AM

## 2015-07-06 NOTE — Patient Instructions (Signed)
Medication Instructions:  Your physician has recommended you make the following change in your medication:  1. INCREASE Carvedilol to 25mg  take one tablet by mouth twice a day  Labwork: No new orders.   Testing/Procedures: No new orders.   Follow-Up: Your physician wants you to follow-up in: 6 MONTHS with Dr Fletcher Anon.  You will receive a reminder letter in the mail two months in advance. If you don't receive a letter, please call our office to schedule the follow-up appointment.   Any Other Special Instructions Will Be Listed Below (If Applicable).     If you need a refill on your cardiac medications before your next appointment, please call your pharmacy.

## 2015-07-07 ENCOUNTER — Ambulatory Visit (INDEPENDENT_AMBULATORY_CARE_PROVIDER_SITE_OTHER): Payer: PPO | Admitting: Podiatry

## 2015-07-07 ENCOUNTER — Encounter: Payer: Self-pay | Admitting: Podiatry

## 2015-07-07 DIAGNOSIS — B351 Tinea unguium: Secondary | ICD-10-CM

## 2015-07-07 DIAGNOSIS — M79675 Pain in left toe(s): Secondary | ICD-10-CM | POA: Diagnosis not present

## 2015-07-07 DIAGNOSIS — M79674 Pain in right toe(s): Secondary | ICD-10-CM

## 2015-07-07 NOTE — Progress Notes (Signed)
Patient ID: [name], female   DOB: 11-08-1931, 81 y.o.   MRN: [id number]  Subjective: This patient presents today complaining that her toenails are thick and elongated uncomfortable and requesting nail debridement. She was seen in office previously as of 01/13/2015 for a nonhealing skin ulcer on the second right toe. Patient has second opinion and ultimately with local care of the wound ultimately healed without any surgical intervention  Objective: Orientated 3 Contracture of DIPJ second right with residual keratoses distal second right without any evidence of an open wound Diffuse plantar callus subsecond MPJ area left The toenails are elongated, brittle, discolored, deforms 6-10 with palpable tenderness  Assessment: Resolve ulcer second right toe pre-ulcerative Symptomatic onychomycoses 6-10 Peripheral arterial disease with evaluation with Dr.Arida  Plan: Debridement toenails 6-10 mechanically and electrically without any bleeding  Return in 3 months for nail debridement

## 2015-07-07 NOTE — Patient Instructions (Signed)
Diabetes and Foot Care Diabetes may cause you to have problems because of poor blood supply (circulation) to your feet and legs. This may cause the skin on your feet to become thinner, break easier, and heal more slowly. Your skin may become dry, and the skin may peel and crack. You may also have nerve damage in your legs and feet causing decreased feeling in them. You may not notice minor injuries to your feet that could lead to infections or more serious problems. Taking care of your feet is one of the most important things you can do for yourself.  HOME CARE INSTRUCTIONS  Wear shoes at all times, even in the house. Do not go barefoot. Bare feet are easily injured.  Check your feet daily for blisters, cuts, and redness. If you cannot see the bottom of your feet, use a mirror or ask someone for help.  Wash your feet with warm water (do not use hot water) and mild soap. Then pat your feet and the areas between your toes until they are completely dry. Do not soak your feet as this can dry your skin.  Apply a moisturizing lotion or petroleum jelly (that does not contain alcohol and is unscented) to the skin on your feet and to dry, brittle toenails. Do not apply lotion between your toes.  Trim your toenails straight across. Do not dig under them or around the cuticle. File the edges of your nails with an emery board or nail file.  Do not cut corns or calluses or try to remove them with medicine.  Wear clean socks or stockings every day. Make sure they are not too tight. Do not wear knee-high stockings since they may decrease blood flow to your legs.  Wear shoes that fit properly and have enough cushioning. To break in new shoes, wear them for just a few hours a day. This prevents you from injuring your feet. Always look in your shoes before you put them on to be sure there are no objects inside.  Do not cross your legs. This may decrease the blood flow to your feet.  If you find a minor scrape,  cut, or break in the skin on your feet, keep it and the skin around it clean and dry. These areas may be cleansed with mild soap and water. Do not cleanse the area with peroxide, alcohol, or iodine.  When you remove an adhesive bandage, be sure not to damage the skin around it.  If you have a wound, look at it several times a day to make sure it is healing.  Do not use heating pads or hot water bottles. They may burn your skin. If you have lost feeling in your feet or legs, you may not know it is happening until it is too late.  Make sure your health care provider performs a complete foot exam at least annually or more often if you have foot problems. Report any cuts, sores, or bruises to your health care provider immediately. SEEK MEDICAL CARE IF:   You have an injury that is not healing.  You have cuts or breaks in the skin.  You have an ingrown nail.  You notice redness on your legs or feet.  You feel burning or tingling in your legs or feet.  You have pain or cramps in your legs and feet.  Your legs or feet are numb.  Your feet always feel cold. SEEK IMMEDIATE MEDICAL CARE IF:   There is increasing redness,   swelling, or pain in or around a wound.  There is a red line that goes up your leg.  Pus is coming from a wound.  You develop a fever or as directed by your health care provider.  You notice a bad smell coming from an ulcer or wound.   This information is not intended to replace advice given to you by your health care provider. Make sure you discuss any questions you have with your health care provider.   Document Released: 06/30/2000 Document Revised: 03/05/2013 Document Reviewed: 12/10/2012 Elsevier Interactive Patient Education 2016 [employer]  

## 2015-07-21 NOTE — Telephone Encounter (Signed)
spoke with [name] about her instructions for her procedure to make sure she understands her arrivel time at 8 am withher 10 am porcedure with Dr Fletcher Anon. and to take only her am meds withonly water and 5ml of 30 evening dose of levemir the night before

## 2015-08-06 ENCOUNTER — Other Ambulatory Visit: Payer: Self-pay | Admitting: Interventional Cardiology

## 2015-08-11 ENCOUNTER — Other Ambulatory Visit: Payer: Self-pay | Admitting: Interventional Cardiology

## 2015-09-09 ENCOUNTER — Other Ambulatory Visit: Payer: Self-pay | Admitting: Interventional Cardiology

## 2015-09-28 ENCOUNTER — Other Ambulatory Visit: Payer: Self-pay | Admitting: Internal Medicine

## 2015-09-28 DIAGNOSIS — I10 Essential (primary) hypertension: Secondary | ICD-10-CM | POA: Diagnosis not present

## 2015-09-28 DIAGNOSIS — E041 Nontoxic single thyroid nodule: Secondary | ICD-10-CM

## 2015-09-28 DIAGNOSIS — I7389 Other specified peripheral vascular diseases: Secondary | ICD-10-CM | POA: Diagnosis not present

## 2015-09-28 DIAGNOSIS — I429 Cardiomyopathy, unspecified: Secondary | ICD-10-CM | POA: Diagnosis not present

## 2015-10-01 ENCOUNTER — Ambulatory Visit
Admission: RE | Admit: 2015-10-01 | Discharge: 2015-10-01 | Disposition: A | Discharge status: Home / Self Care | Payer: PPO | Source: Ambulatory Visit | Attending: Internal Medicine | Admitting: Internal Medicine

## 2015-10-01 DIAGNOSIS — E041 Nontoxic single thyroid nodule: Secondary | ICD-10-CM

## 2015-10-01 DIAGNOSIS — E042 Nontoxic multinodular goiter: Secondary | ICD-10-CM | POA: Diagnosis not present

## 2015-10-06 ENCOUNTER — Other Ambulatory Visit: Payer: Self-pay | Admitting: Internal Medicine

## 2015-10-06 DIAGNOSIS — E041 Nontoxic single thyroid nodule: Secondary | ICD-10-CM

## 2015-10-07 ENCOUNTER — Ambulatory Visit
Admission: RE | Admit: 2015-10-07 | Discharge: 2015-10-07 | Disposition: A | Discharge status: Home / Self Care | Payer: PPO | Source: Ambulatory Visit | Attending: Internal Medicine | Admitting: Internal Medicine

## 2015-10-07 ENCOUNTER — Other Ambulatory Visit (HOSPITAL_COMMUNITY)
Admission: RE | Admit: 2015-10-07 | Discharge: 2015-10-07 | Disposition: A | Discharge status: Home / Self Care | Payer: PPO | Source: Ambulatory Visit | Attending: Physician Assistant | Admitting: Physician Assistant

## 2015-10-07 DIAGNOSIS — E042 Nontoxic multinodular goiter: Secondary | ICD-10-CM | POA: Diagnosis not present

## 2015-10-07 DIAGNOSIS — E041 Nontoxic single thyroid nodule: Secondary | ICD-10-CM

## 2015-10-07 NOTE — Procedures (Signed)
Using direct ultrasound guidance, 3 passes were made using needles into the nodules within the right and left lobe of the thyroid.   Ultrasound was used to confirm needle placements on all occasions.   Specimens were sent to Pathology for analysis.   Meliah Appleman S Tana Trefry PA-C

## 2015-10-16 DIAGNOSIS — L0390 Cellulitis, unspecified: Secondary | ICD-10-CM

## 2015-10-16 HISTORY — DX: Cellulitis, unspecified: L03.90

## 2015-10-20 ENCOUNTER — Ambulatory Visit (INDEPENDENT_AMBULATORY_CARE_PROVIDER_SITE_OTHER): Payer: PPO | Admitting: Podiatry

## 2015-10-20 ENCOUNTER — Encounter: Payer: Self-pay | Admitting: Podiatry

## 2015-10-20 DIAGNOSIS — L84 Corns and callosities: Secondary | ICD-10-CM

## 2015-10-20 DIAGNOSIS — B351 Tinea unguium: Secondary | ICD-10-CM | POA: Diagnosis not present

## 2015-10-20 DIAGNOSIS — M79675 Pain in left toe(s): Secondary | ICD-10-CM

## 2015-10-20 DIAGNOSIS — M79674 Pain in right toe(s): Secondary | ICD-10-CM | POA: Diagnosis not present

## 2015-10-20 DIAGNOSIS — I739 Peripheral vascular disease, unspecified: Secondary | ICD-10-CM | POA: Diagnosis not present

## 2015-10-20 NOTE — Progress Notes (Signed)
Patient ID: LAHANA STOCKHAUSEN, female   DOB: 06-30-1932, 82 y.o.   MRN: [id number]  Subjective: This patient presents today complaining that her toenails are thick and elongated uncomfortable and requesting nail debridement. She was seen in office previously as of 01/13/2015 for a nonhealing skin ulcer on the second right toe. Patient has second opinion and ultimately with local care of the wound ultimately healed without any surgical intervention Patient has ongoing surveillance for peripheral arterial disease with Dr. Audelia Acton. Last visit per patient December 2016  Objective: Orientated 3 Contracture of DIPJ second right with residual keratoses distal second right without any evidence of an open wound Diffuse plantar callus subsecond with bleeding in the callus MPJ area left The toenails are elongated, brittle, discolored, deforms 6-10 with palpable tenderness Right lower extremity longer than left  Assessment: Resolve ulcer second right toe pre-ulcerative Symptomatic onychomycoses 6-10 Peripheral arterial disease with evaluation with Dr.Arida Pre-ulcerative callus plantar left Type II diabetic  Plan: Debridement toenails 6-10 mechanically and electrically without any bleeding Debrided pre-ulcerative plantar callus left without any bleeding  Reappoint 3 months

## 2015-10-20 NOTE — Patient Instructions (Signed)
Diabetes and Foot Care Diabetes may cause you to have problems because of poor blood supply (circulation) to your feet and legs. This may cause the skin on your feet to become thinner, break easier, and heal more slowly. Your skin may become dry, and the skin may peel and crack. You may also have nerve damage in your legs and feet causing decreased feeling in them. You may not notice minor injuries to your feet that could lead to infections or more serious problems. Taking care of your feet is one of the most important things you can do for yourself.  HOME CARE INSTRUCTIONS  Wear shoes at all times, even in the house. Do not go barefoot. Bare feet are easily injured.  Check your feet daily for blisters, cuts, and redness. If you cannot see the bottom of your feet, use a mirror or ask someone for help.  Wash your feet with warm water (do not use hot water) and mild soap. Then pat your feet and the areas between your toes until they are completely dry. Do not soak your feet as this can dry your skin.  Apply a moisturizing lotion or petroleum jelly (that does not contain alcohol and is unscented) to the skin on your feet and to dry, brittle toenails. Do not apply lotion between your toes.  Trim your toenails straight across. Do not dig under them or around the cuticle. File the edges of your nails with an emery board or nail file.  Do not cut corns or calluses or try to remove them with medicine.  Wear clean socks or stockings every day. Make sure they are not too tight. Do not wear knee-high stockings since they may decrease blood flow to your legs.  Wear shoes that fit properly and have enough cushioning. To break in new shoes, wear them for just a few hours a day. This prevents you from injuring your feet. Always look in your shoes before you put them on to be sure there are no objects inside.  Do not cross your legs. This may decrease the blood flow to your feet.  If you find a minor scrape,  cut, or break in the skin on your feet, keep it and the skin around it clean and dry. These areas may be cleansed with mild soap and water. Do not cleanse the area with peroxide, alcohol, or iodine.  When you remove an adhesive bandage, be sure not to damage the skin around it.  If you have a wound, look at it several times a day to make sure it is healing.  Do not use heating pads or hot water bottles. They may burn your skin. If you have lost feeling in your feet or legs, you may not know it is happening until it is too late.  Make sure your health care provider performs a complete foot exam at least annually or more often if you have foot problems. Report any cuts, sores, or bruises to your health care provider immediately. SEEK MEDICAL CARE IF:   You have an injury that is not healing.  You have cuts or breaks in the skin.  You have an ingrown nail.  You notice redness on your legs or feet.  You feel burning or tingling in your legs or feet.  You have pain or cramps in your legs and feet.  Your legs or feet are numb.  Your feet always feel cold. SEEK IMMEDIATE MEDICAL CARE IF:   There is increasing redness,   swelling, or pain in or around a wound.  There is a red line that goes up your leg.  Pus is coming from a wound.  You develop a fever or as directed by your health care provider.  You notice a bad smell coming from an ulcer or wound.   This information is not intended to replace advice given to you by your health care provider. Make sure you discuss any questions you have with your health care provider.   Document Released: 06/30/2000 Document Revised: 03/05/2013 Document Reviewed: 12/10/2012 Elsevier Interactive Patient Education 2016 [employer]  

## 2015-10-27 ENCOUNTER — Ambulatory Visit (INDEPENDENT_AMBULATORY_CARE_PROVIDER_SITE_OTHER): Payer: PPO | Admitting: Podiatry

## 2015-10-27 ENCOUNTER — Encounter (HOSPITAL_COMMUNITY): Payer: Self-pay | Admitting: *Deleted

## 2015-10-27 ENCOUNTER — Encounter: Payer: Self-pay | Admitting: Podiatry

## 2015-10-27 ENCOUNTER — Inpatient Hospital Stay (HOSPITAL_COMMUNITY)
Admission: EM | Admit: 2015-10-27 | Discharge: 2015-10-30 | DRG: 580 | Disposition: A | Discharge status: Home / Self Care | Payer: PPO | Attending: Family Medicine | Admitting: Family Medicine

## 2015-10-27 VITALS — BP 144/80 | HR 100 | Temp 98.1°F | Resp 12

## 2015-10-27 DIAGNOSIS — L02612 Cutaneous abscess of left foot: Secondary | ICD-10-CM | POA: Diagnosis not present

## 2015-10-27 DIAGNOSIS — E782 Mixed hyperlipidemia: Secondary | ICD-10-CM | POA: Diagnosis not present

## 2015-10-27 DIAGNOSIS — F419 Anxiety disorder, unspecified: Secondary | ICD-10-CM | POA: Diagnosis present

## 2015-10-27 DIAGNOSIS — Z7982 Long term (current) use of aspirin: Secondary | ICD-10-CM

## 2015-10-27 DIAGNOSIS — M7989 Other specified soft tissue disorders: Secondary | ICD-10-CM | POA: Diagnosis not present

## 2015-10-27 DIAGNOSIS — E1165 Type 2 diabetes mellitus with hyperglycemia: Secondary | ICD-10-CM | POA: Diagnosis not present

## 2015-10-27 DIAGNOSIS — E1369 Other specified diabetes mellitus with other specified complication: Secondary | ICD-10-CM

## 2015-10-27 DIAGNOSIS — R600 Localized edema: Secondary | ICD-10-CM | POA: Diagnosis not present

## 2015-10-27 DIAGNOSIS — Z955 Presence of coronary angioplasty implant and graft: Secondary | ICD-10-CM

## 2015-10-27 DIAGNOSIS — L02619 Cutaneous abscess of unspecified foot: Secondary | ICD-10-CM

## 2015-10-27 DIAGNOSIS — E890 Postprocedural hypothyroidism: Secondary | ICD-10-CM | POA: Diagnosis present

## 2015-10-27 DIAGNOSIS — I771 Stricture of artery: Secondary | ICD-10-CM | POA: Diagnosis present

## 2015-10-27 DIAGNOSIS — E1142 Type 2 diabetes mellitus with diabetic polyneuropathy: Secondary | ICD-10-CM | POA: Diagnosis present

## 2015-10-27 DIAGNOSIS — E119 Type 2 diabetes mellitus without complications: Secondary | ICD-10-CM | POA: Diagnosis not present

## 2015-10-27 DIAGNOSIS — Z882 Allergy status to sulfonamides status: Secondary | ICD-10-CM | POA: Diagnosis not present

## 2015-10-27 DIAGNOSIS — Z79899 Other long term (current) drug therapy: Secondary | ICD-10-CM

## 2015-10-27 DIAGNOSIS — I2510 Atherosclerotic heart disease of native coronary artery without angina pectoris: Secondary | ICD-10-CM | POA: Diagnosis present

## 2015-10-27 DIAGNOSIS — Z794 Long term (current) use of insulin: Secondary | ICD-10-CM

## 2015-10-27 DIAGNOSIS — M869 Osteomyelitis, unspecified: Secondary | ICD-10-CM

## 2015-10-27 DIAGNOSIS — L03115 Cellulitis of right lower limb: Secondary | ICD-10-CM | POA: Diagnosis not present

## 2015-10-27 DIAGNOSIS — L97522 Non-pressure chronic ulcer of other part of left foot with fat layer exposed: Secondary | ICD-10-CM | POA: Diagnosis not present

## 2015-10-27 DIAGNOSIS — E11621 Type 2 diabetes mellitus with foot ulcer: Secondary | ICD-10-CM | POA: Diagnosis not present

## 2015-10-27 DIAGNOSIS — I10 Essential (primary) hypertension: Secondary | ICD-10-CM | POA: Diagnosis present

## 2015-10-27 DIAGNOSIS — I70213 Atherosclerosis of native arteries of extremities with intermittent claudication, bilateral legs: Secondary | ICD-10-CM | POA: Diagnosis not present

## 2015-10-27 DIAGNOSIS — L03119 Cellulitis of unspecified part of limb: Secondary | ICD-10-CM | POA: Diagnosis not present

## 2015-10-27 DIAGNOSIS — M79609 Pain in unspecified limb: Secondary | ICD-10-CM | POA: Diagnosis not present

## 2015-10-27 DIAGNOSIS — E1151 Type 2 diabetes mellitus with diabetic peripheral angiopathy without gangrene: Secondary | ICD-10-CM

## 2015-10-27 DIAGNOSIS — L03116 Cellulitis of left lower limb: Secondary | ICD-10-CM | POA: Diagnosis not present

## 2015-10-27 DIAGNOSIS — I429 Cardiomyopathy, unspecified: Secondary | ICD-10-CM | POA: Diagnosis present

## 2015-10-27 DIAGNOSIS — L97509 Non-pressure chronic ulcer of other part of unspecified foot with unspecified severity: Secondary | ICD-10-CM

## 2015-10-27 DIAGNOSIS — K219 Gastro-esophageal reflux disease without esophagitis: Secondary | ICD-10-CM | POA: Diagnosis present

## 2015-10-27 DIAGNOSIS — L0390 Cellulitis, unspecified: Secondary | ICD-10-CM | POA: Diagnosis present

## 2015-10-27 DIAGNOSIS — L03019 Cellulitis of unspecified finger: Secondary | ICD-10-CM | POA: Diagnosis not present

## 2015-10-27 HISTORY — DX: Cellulitis, unspecified: L03.90

## 2015-10-27 LAB — BASIC METABOLIC PANEL
Anion gap: 12 (ref 5–15)
BUN: 13 mg/dL (ref 6–20)
CO2: 25 mmol/L (ref 22–32)
Calcium: 9.3 mg/dL (ref 8.9–10.3)
Chloride: 100 mmol/L — ABNORMAL LOW (ref 101–111)
Creatinine, Ser: 0.6 mg/dL (ref 0.44–1.00)
GFR calc Af Amer: >60 mL/min (ref >60)
GFR calc non Af Amer: >60 mL/min (ref >60)
Glucose, Bld: 277 mg/dL — ABNORMAL HIGH (ref 65–99)
Potassium: 4.3 mmol/L (ref 3.5–5.1)
Sodium: 137 mmol/L (ref 135–145)

## 2015-10-27 LAB — GLUCOSE, CAPILLARY: Glucose-Capillary: 307 mg/dL — ABNORMAL HIGH (ref 65–99)

## 2015-10-27 LAB — CBC
HCT: 38.7 % (ref 36.0–46.0)
Hemoglobin: 12.8 g/dL (ref 12.0–15.0)
MCH: 29 pg (ref 26.0–34.0)
MCHC: 33.1 g/dL (ref 30.0–36.0)
MCV: 87.8 fL (ref 78.0–100.0)
Platelets: 265 10*3/uL (ref 150–400)
RBC: 4.41 MIL/uL (ref 3.87–5.11)
RDW: 13.2 % (ref 11.5–15.5)
WBC: 9.8 10*3/uL (ref 4.0–10.5)

## 2015-10-27 MED ORDER — ASPIRIN EC 325 MG PO TBEC
325.0000 mg | DELAYED_RELEASE_TABLET | Freq: Every day | ORAL | Status: DC
  Administered 2015-10-28 – 2015-10-29 (×2): 325 mg via ORAL
  Filled 2015-10-27 (×2): qty 1

## 2015-10-27 MED ORDER — ONDANSETRON HCL 4 MG PO TABS: 4.0000 mg | ORAL_TABLET | Freq: Four times a day (QID) | ORAL | Status: DC | PRN

## 2015-10-27 MED ORDER — ACETAMINOPHEN 325 MG PO TABS: 650.0000 mg | ORAL_TABLET | Freq: Four times a day (QID) | ORAL | Status: DC | PRN

## 2015-10-27 MED ORDER — AMLODIPINE BESYLATE 10 MG PO TABS
10.0000 mg | ORAL_TABLET | Freq: Every day | ORAL | Status: DC
  Administered 2015-10-27: 10 mg via ORAL
  Filled 2015-10-27 (×2): qty 1

## 2015-10-27 MED ORDER — PANTOPRAZOLE SODIUM 40 MG PO TBEC
40.0000 mg | DELAYED_RELEASE_TABLET | Freq: Every day | ORAL | Status: DC
  Administered 2015-10-28 – 2015-10-30 (×3): 40 mg via ORAL
  Filled 2015-10-27 (×3): qty 1

## 2015-10-27 MED ORDER — VANCOMYCIN HCL 10 G IV SOLR
1500.0000 mg | INTRAVENOUS | Status: DC
  Administered 2015-10-28 – 2015-10-29 (×2): 1500 mg via INTRAVENOUS
  Filled 2015-10-27 (×3): qty 1500

## 2015-10-27 MED ORDER — INSULIN ASPART 100 UNIT/ML ~~LOC~~ SOLN
14.0000 [IU] | Freq: Three times a day (TID) | SUBCUTANEOUS | Status: DC
  Administered 2015-10-28 – 2015-10-30 (×7): 14 [IU] via SUBCUTANEOUS

## 2015-10-27 MED ORDER — VANCOMYCIN HCL 10 G IV SOLR
1500.0000 mg | Freq: Once | INTRAVENOUS | Status: AC
  Administered 2015-10-27: 1500 mg via INTRAVENOUS
  Filled 2015-10-27: qty 1500

## 2015-10-27 MED ORDER — LISINOPRIL 20 MG PO TABS: 20.0000 mg | ORAL_TABLET | Freq: Every day | ORAL | Status: DC

## 2015-10-27 MED ORDER — ACETAMINOPHEN 650 MG RE SUPP: 650.0000 mg | Freq: Four times a day (QID) | RECTAL | Status: DC | PRN

## 2015-10-27 MED ORDER — HYDROCODONE-ACETAMINOPHEN 5-325 MG PO TABS
1.0000 | ORAL_TABLET | ORAL | Status: DC | PRN
  Administered 2015-10-27 – 2015-10-29 (×4): 1 via ORAL
  Filled 2015-10-27 (×5): qty 1

## 2015-10-27 MED ORDER — ONDANSETRON HCL 4 MG/2ML IJ SOLN: 4.0000 mg | Freq: Four times a day (QID) | INTRAMUSCULAR | Status: DC | PRN

## 2015-10-27 MED ORDER — NITROGLYCERIN 0.4 MG SL SUBL: 0.4000 mg | SUBLINGUAL_TABLET | SUBLINGUAL | Status: DC | PRN

## 2015-10-27 MED ORDER — OXYCODONE-ACETAMINOPHEN 5-325 MG PO TABS
1.0000 | ORAL_TABLET | Freq: Once | ORAL | Status: AC
  Administered 2015-10-27: 1 via ORAL
  Filled 2015-10-27: qty 1

## 2015-10-27 MED ORDER — ENOXAPARIN SODIUM 40 MG/0.4ML ~~LOC~~ SOLN
40.0000 mg | Freq: Every day | SUBCUTANEOUS | Status: DC
  Administered 2015-10-27 – 2015-10-29 (×3): 40 mg via SUBCUTANEOUS
  Filled 2015-10-27 (×3): qty 0.4

## 2015-10-27 MED ORDER — ATORVASTATIN CALCIUM 20 MG PO TABS
20.0000 mg | ORAL_TABLET | Freq: Every day | ORAL | Status: DC
  Administered 2015-10-27 – 2015-10-30 (×4): 20 mg via ORAL
  Filled 2015-10-27 (×4): qty 1

## 2015-10-27 MED ORDER — PAROXETINE HCL 20 MG PO TABS
20.0000 mg | ORAL_TABLET | Freq: Every day | ORAL | Status: DC
  Administered 2015-10-28 – 2015-10-30 (×3): 20 mg via ORAL
  Filled 2015-10-27 (×3): qty 1

## 2015-10-27 MED ORDER — CARVEDILOL 25 MG PO TABS
25.0000 mg | ORAL_TABLET | Freq: Two times a day (BID) | ORAL | Status: DC
  Administered 2015-10-28 – 2015-10-30 (×4): 25 mg via ORAL
  Filled 2015-10-27 (×4): qty 1

## 2015-10-27 NOTE — Progress Notes (Signed)
   Subjective:    Patient ID: [name], female    DOB: 12-May-1932, 82 y.o.   MRN: [id number]  HPI This patient presents today with a 2 day history of a painful swollen left forefoot. Patient has a history of ongoing pre-ulcerative callus which was last debrided on the visit of 10/20/2015 without any clinical sign of infection. Patient is a type II diabetic with peripheral arterial disease that has been evaluated by Dr. Fletcher Anon   Review of Systems  Cardiovascular: Positive for leg swelling.  Musculoskeletal: Positive for joint swelling.       Objective:   Physical Exam BP 144/80 Pulse 100 Respiration 12 temperature 98.1 Fahrenheit    Patient orientated 3  Vascular: Edematous dorsal and plantar left forefoot DP and PT pulses nonpalpable bilaterally  Dermatological: Erythematous edema dorsal plantar left forefoot Plantar callus with blister formation and drainage left forefoot  Neurological: Sensation to 10 g monofilament wire 34/5 right and 2/5 left Ankle reflex reactive bilaterally Vibratory sensation nonreactive bilaterally     Assessment & Plan:   Assessment: Cellulitis/ abscess left forefoot Diabetic with history of peripheral arterial disease Diabetic for a history of peripheral neuropathy  Plan: At this time I recommend that patient present to the emergency department immediately  upon leaving our office for follow-up care and treatment as indicated, most likely will need I&D and IV antibiotics with glucose control.

## 2015-10-27 NOTE — ED Provider Notes (Signed)
CSN: [id number]     Arrival date & time 10/27/15  1538 History   First MD Initiated Contact with Patient 10/27/15 1843     Chief Complaint  Patient presents with  . Foot Swelling  . Cellulitis   (Consider location/radiation/quality/duration/timing/severity/associated sxs/prior Treatment) HPI 82 y.o. female with a hx of PAD, DM2, presents to the Emergency Department today complaining of left foot swelling/erythema. Pt sent from PCP office due to suspected cellulitis and possible I+D of abscess on plantar aspect of left foot. Scant drainage. Per MD note, felt the need for IV ABX. Pt states pain is 10/10 and located on plantar aspect along MTPs. Notes recent callus debridement on 10-20-15. No fevers. No CP/SOB/ABD pain. No N/V/D. No other symptoms noted. Pt able to ambulate, but states that she hobbles.    Past Medical History  Diagnosis Date  . Complication of anesthesia   . PONV (postoperative nausea and vomiting)   . Coronary artery disease   . Hypertension   . Hypothyroidism   . Anxiety   . Shortness of breath   . Diabetes mellitus without complication (HCC)     insulin dependent  . GERD (gastroesophageal reflux disease)   . Neuromuscular disorder (HCC)     muscle cramps to lower extremities  . Other primary cardiomyopathies    Past Surgical History  Procedure Laterality Date  . Tonsillectomy    . Abdominal hysterectomy    . Cholecystectomy    . Thyroid surgery      radioactive iodine   . Coronary angioplasty with stent placement  08/09/2011    DES  to mid circumflex  . Left heart catheterization with coronary angiogram N/A 08/08/2012    Procedure: LEFT HEART CATHETERIZATION WITH CORONARY ANGIOGRAM;  Surgeon: [name], MD;  Location: [facility] CATH LAB;  Service: Cardiovascular;  Laterality: N/A;  . Peripheral vascular catheterization N/A 12/30/2014    Procedure: Lower Extremity Angiography;  Surgeon: Wellington Hampshire, MD;  Location: [place] CV LAB;  Service:  Cardiovascular;  Laterality: N/A;   Family History  Problem Relation Age of Onset  . Heart disease Father   . Diabetes Sister    Social History  Substance Use Topics  . Smoking status: Never Smoker   . Smokeless tobacco: Never Used  . Alcohol Use: Yes     Comment: rare   OB History    No data available     Review of Systems ROS reviewed and all are negative for acute change except as noted in the HPI.  Allergies  Sulfa antibiotics  Home Medications   Prior to Admission medications   Medication Sig Start Date End Date Taking? Authorizing Provider  amLODipine (NORVASC) 10 MG tablet TAKE ONE TABLET BY MOUTH ONCE DAILY 09/09/15   [name], MD  aspirin 325 MG EC tablet Take 325 mg by mouth at bedtime.     Historical Provider, MD  atorvastatin (LIPITOR) 20 MG tablet Take 1 tablet (20 mg total) by mouth daily. 10/21/14   [name], MD  B Complex-C (SUPER B COMPLEX PO) Take 1 tablet by mouth daily.    Historical Provider, MD  carvedilol (COREG) 25 MG tablet Take 1 tablet (25 mg total) by mouth 2 (two) times daily with a meal. 07/06/15   Wellington Hampshire, MD  Cholecalciferol (VITAMIN D PO) Take 1 tablet by mouth daily.    Historical Provider, MD  glyBURIDE (DIABETA) 5 MG tablet Take 5 mg by mouth at bedtime.  Historical Provider, MD  Insulin Detemir (LEVEMIR FLEXPEN) 100 UNIT/ML Pen Inject 30 Units into the skin at bedtime.    Historical Provider, MD  lisinopril (PRINIVIL,ZESTRIL) 20 MG tablet Take 20 mg by mouth daily with supper.     Historical Provider, MD  Multiple Vitamin (MULTIVITAMIN) capsule Take 1 capsule by mouth daily.    Historical Provider, MD  NITROSTAT 0.4 MG SL tablet PLACE 1 TABLET UNDER THE TONGUE EVERY 5 MINUTES AS NEEDED FOR CHEST PAIN 06/14/15   [name], MD  Omega-3 Fatty Acids (FISH OIL) 1200 MG CAPS Take 2,400 mg by mouth 2 (two) times daily.    Historical Provider, MD  pantoprazole (PROTONIX) 40 MG tablet Take 40 mg by mouth  daily before breakfast.    Historical Provider, MD  pantoprazole (PROTONIX) 40 MG tablet TAKE ONE TABLET BY MOUTH ONCE DAILY AT  6  AM 08/11/15   [name], MD  PARoxetine (PAXIL) 20 MG tablet Take 20 mg by mouth daily with breakfast.     Historical Provider, MD  VITAMIN E PO Take 1 tablet by mouth daily.    Historical Provider, MD   BP 149/70 mmHg  Pulse 115  Temp(Src) 99 F (37.2 C) (Oral)  Resp 18  SpO2 98%   Physical Exam  Constitutional: She is oriented to person, place, and time. She appears well-developed and well-nourished.  HENT:  Head: Normocephalic and atraumatic.  Eyes: EOM are normal. Pupils are equal, round, and reactive to light.  Neck: Normal range of motion. Neck supple. No tracheal deviation present.  Cardiovascular: Normal rate, regular rhythm, normal heart sounds and intact distal pulses.   No murmur heard. Pulmonary/Chest: Effort normal and breath sounds normal.  Abdominal: Soft.  Musculoskeletal: Normal range of motion.       Left ankle: She exhibits swelling. Achilles tendon normal.  Left Foot: Erythematous edema of dorsal plantar aspect Plantar callus noted with blister formation minimal drainage from site. Distal pulses appreciated at 2+. Motor/sensory intact.  Neurological: She is alert and oriented to person, place, and time.  Skin: Skin is warm and dry.  Psychiatric: She has a normal mood and affect. Her behavior is normal. Thought content normal.  Nursing note and vitals reviewed.  ED Course  Procedures (including critical care time) Labs Review Labs Reviewed  BASIC METABOLIC PANEL - Abnormal; Notable for the following:    Chloride 100 (*)    Glucose, Bld 277 (*)    All other components within normal limits  CULTURE, BLOOD (ROUTINE X 2)  CULTURE, BLOOD (ROUTINE X 2)  CBC   Imaging Review No results found. I have personally reviewed and evaluated these images and lab results as part of my medical decision-making.   EKG  Interpretation None     MDM  I have reviewed and evaluated the relevant laboratory values.  I have reviewed the relevant previous healthcare records. I obtained HPI from historian. Patient discussed with supervising physician  ED Course:   Assessment: Pt is a 80yF with hx DM2, PAD who presents with left foot cellulitis/abscess with minimal drainage. On exam, pt in NAD. Nontoxic/nonseptic appearing. VS show sustained tachycardia. Afebrile. Lungs CTA. Heart RRR. Erythematous edema of dorsal/plantar aspect left foot. Cellulitic. Minimal purulent drainage on plantar aspect. TTP. Labs show elevated glucose. No leukocytosis. Given Vanc in ED. Plan is to Admit to medicine due to risk factors and cellulitic infection.    Disposition/Plan:  Admit to Medicine  Pt acknowledges and agrees with plan  Supervising Physician Orlie Dakin, MD   Final diagnoses:  Cellulitis of left lower extremity        Shary Decamp, PA-C 10/27/15 [place], MD 10/28/15 [phone]

## 2015-10-27 NOTE — H&P (Signed)
[facility] History and Physical  HAVANA DEFORD [id number] DOB: 1932-03-14 DOA: 10/27/2015  Referring physician: ED PCP: Marton Redwood, MD   Chief Complaint: Left foot swelling and pain  HPI:  Ms. [name] is a 82 year old female with a past medical history significant for HTN, CAD, diabetes mellitus type 2, and Gerd; who presents with complaints of left foot pain and swelling. She notes that she went on week ago to get her toenails clipped at [facility] by Dr. Amalia Hailey. At that time he trimmed her toenails and also trimmed a callus on the plantar aspect of her foot. He advised her to wear socks at all times which she states she complied. However , 4 days ago she started to note some pain on the bottom aspect of her foot but she ignored it. Yesterday she noted significant swelling and redness of the foot. She complains of very painful to bear weight on her left foot. Puslike fluid was able to be expressed from her foot. Denies having any fever, chills, shortness of breath, abdominal pain, nausea, vomiting, diarrhea.  Upon admission the patient was evaluated and seen to be slightly tachycardic with a heart rate up to 115 and all other vitals were stable. Lab work only revealed elevation in blood glucose to 277, and all labs appear to be within normal limits.  Review of Systems  Constitutional: Negative for fever, chills and weight loss.  HENT: Negative for ear pain.   Eyes: Negative for photophobia and pain.  Respiratory: Negative for hemoptysis, sputum production and shortness of breath.   Cardiovascular: Negative for chest pain and leg swelling.  Gastrointestinal: Negative for nausea, vomiting and abdominal pain.  Genitourinary: Negative for urgency and frequency.  Musculoskeletal: Negative for back pain.       Positive for Foot swelling  Skin: Negative for itching and rash.       Redness of the left foot  Neurological: Negative for sensory change and speech change.    Endo/Heme/Allergies: Negative for environmental allergies and polydipsia.  Psychiatric/Behavioral: Negative for hallucinations and substance abuse.        Past Medical History  Diagnosis Date  . Complication of anesthesia   . PONV (postoperative nausea and vomiting)   . Coronary artery disease   . Hypertension   . Hypothyroidism   . Anxiety   . Shortness of breath   . Diabetes mellitus without complication (HCC)     insulin dependent  . GERD (gastroesophageal reflux disease)   . Neuromuscular disorder (HCC)     muscle cramps to lower extremities  . Other primary cardiomyopathies      Past Surgical History  Procedure Laterality Date  . Tonsillectomy    . Abdominal hysterectomy    . Cholecystectomy    . Thyroid surgery      radioactive iodine   . Coronary angioplasty with stent placement  08/09/2011    DES  to mid circumflex  . Left heart catheterization with coronary angiogram N/A 08/08/2012    Procedure: LEFT HEART CATHETERIZATION WITH CORONARY ANGIOGRAM;  Surgeon: [name], MD;  Location: [facility] CATH LAB;  Service: Cardiovascular;  Laterality: N/A;  . Peripheral vascular catheterization N/A 12/30/2014    Procedure: Lower Extremity Angiography;  Surgeon: Wellington Hampshire, MD;  Location: [place] CV LAB;  Service: Cardiovascular;  Laterality: N/A;      Social History:  reports that she has never smoked. She has never used smokeless tobacco. She reports that she drinks alcohol. She reports that she  does not use illicit drugs. Where does patient live--home  Can patient participate in ADLs?Yes  Allergies  Allergen Reactions  . Sulfa Antibiotics Nausea And Vomiting    Severe vomiting.     Family History  Problem Relation Age of Onset  . Heart disease Father   . Diabetes Sister       FAMILY HISTORY  When questioned  Directly-patient reports  No family history of HTN, CVA ,DIABETES, TB, Cancer CAD, Bleeding Disorders, Sickle Cell, diabetes, anemia,  asthma,   Prior to Admission medications   Medication Sig Start Date End Date Taking? Authorizing Provider  amLODipine (NORVASC) 10 MG tablet TAKE ONE TABLET BY MOUTH ONCE DAILY 09/09/15   [name], MD  aspirin 325 MG EC tablet Take 325 mg by mouth at bedtime.     Historical Provider, MD  atorvastatin (LIPITOR) 20 MG tablet Take 1 tablet (20 mg total) by mouth daily. 10/21/14   [name], MD  B Complex-C (SUPER B COMPLEX PO) Take 1 tablet by mouth daily.    Historical Provider, MD  carvedilol (COREG) 25 MG tablet Take 1 tablet (25 mg total) by mouth 2 (two) times daily with a meal. 07/06/15   Wellington Hampshire, MD  Cholecalciferol (VITAMIN D PO) Take 1 tablet by mouth daily.    Historical Provider, MD  glyBURIDE (DIABETA) 5 MG tablet Take 5 mg by mouth at bedtime.     Historical Provider, MD  Insulin Detemir (LEVEMIR FLEXPEN) 100 UNIT/ML Pen Inject 30 Units into the skin at bedtime.    Historical Provider, MD  lisinopril (PRINIVIL,ZESTRIL) 20 MG tablet Take 20 mg by mouth daily with supper.     Historical Provider, MD  Multiple Vitamin (MULTIVITAMIN) capsule Take 1 capsule by mouth daily.    Historical Provider, MD  NITROSTAT 0.4 MG SL tablet PLACE 1 TABLET UNDER THE TONGUE EVERY 5 MINUTES AS NEEDED FOR CHEST PAIN 06/14/15   [name], MD  Omega-3 Fatty Acids (FISH OIL) 1200 MG CAPS Take 2,400 mg by mouth 2 (two) times daily.    Historical Provider, MD  pantoprazole (PROTONIX) 40 MG tablet Take 40 mg by mouth daily before breakfast.    Historical Provider, MD  pantoprazole (PROTONIX) 40 MG tablet TAKE ONE TABLET BY MOUTH ONCE DAILY AT  6  AM 08/11/15   [name], MD  PARoxetine (PAXIL) 20 MG tablet Take 20 mg by mouth daily with breakfast.     Historical Provider, MD  VITAMIN E PO Take 1 tablet by mouth daily.    Historical Provider, MD     Physical Exam: Filed Vitals:   10/27/15 1548 10/27/15 1819 10/27/15 1900  BP: 155/83 149/70 144/70  Pulse: 110 115  102  Temp: 99.3 F (37.4 C) 99 F (37.2 C)   TempSrc: Oral Oral   Resp: 18 18   SpO2: 98% 98% 96%     Constitutional: Vital signs reviewed. Patient is a elderly female well-developed and well-nourished in no acute distress and cooperative with exam. Alert and oriented x3.  Head: Normocephalic and atraumatic  Ear: TM normal bilaterally  Mouth: no erythema or exudates, MMM  Eyes: PERRL, EOMI, conjunctivae normal, No scleral icterus.  Neck: Supple, Trachea midline normal ROM, No JVD, mass, thyromegaly, or carotid bruit present.  Cardiovascular: RRR, S1 normal, S2 normal, no MRG, pulses symmetric and intact bilaterally  Pulmonary/Chest: CTAB, no wheezes, rales, or rhonchi  Abdominal: Soft. Non-tender, non-distended, bowel sounds are normal, no masses, organomegaly, or  guarding present.  GU: no CVA tenderness Musculoskeletal: No joint deformities, erythema, or stiffness, ROM full and no nontender Ext: no edema and no cyanosis, pulses palpable bilaterally (DP and PT)  Hematology: no cervical, inginal, or axillary adenopathy.  Neurological: A&O x3, Strenght is normal and symmetric bilaterally, cranial nerve II-XII are grossly intact, no focal motor deficit, sensory intact to light touch bilaterally.  Skin: Erythema and swelling of the plantar and dorsal aspect of the left foot with callus as seen below  Psychiatric: Normal mood and affect. speech and behavior is normal. Judgment and thought content normal. Cognition and memory are normal.      Data Review   Micro Results No results found for this or any previous visit (from the past 240 hour(s)).  Radiology Reports US Thyroid Biopsy  10/07/2015  INDICATION: Multinodular goiter with history of previous thyroidectomy and radioactive iodine. Request for ultrasound guided fine needle aspirate biopsy of the right inferior pole thyroid nodule which measures 20 x 17 x 29 mm and contains coarse calcifications and the left mid lobe thyroid nodule  measuring 29 x 22 x 26 mm which is mostly solid and contains coarse calcifications. EXAM: ULTRASOUND GUIDED NEEDLE ASPIRATE BIOPSY OF THE THYROID GLAND MEDICATIONS: 1% Lidocaine. ANESTHESIA/SEDATION: No sedation medication given. COMPARISON:  Ultrasound done 10/01/2015. COMPLICATIONS: None immediate. PROCEDURE: Thyroid biopsy was thoroughly discussed with the patient and questions were answered. The benefits, risks, alternatives, and complications were also discussed. The patient understands and wishes to proceed with the procedure. Written consent was obtained. Ultrasound was performed to localize and mark an adequate site for the biopsy. The patient was then prepped and draped in a normal sterile fashion. Local anesthesia was provided with 1% lidocaine. Using direct ultrasound guidance, 3 passes were made using needles into the nodules within the right inferior pole of the thyroid and within the left mid lobe of the thyroid. Ultrasound was used to confirm needle placements on all occasions. Specimens were sent to Pathology for analysis. IMPRESSION: Ultrasound guided needle aspirate biopsy performed of the right and left thyroid nodules. Read by:  Gareth Eagle, PA-C Electronically Signed   By: Jacqulynn Cadet M.D.   On: 10/07/2015 11:22   US Thyroid Biopsy  10/07/2015  INDICATION: Multinodular goiter with history of previous thyroidectomy and radioactive iodine. Request for ultrasound guided fine needle aspirate biopsy of the right inferior pole thyroid nodule which measures 20 x 17 x 29 mm and contains coarse calcifications and the left mid lobe thyroid nodule measuring 29 x 22 x 26 mm which is mostly solid and contains coarse calcifications. EXAM: ULTRASOUND GUIDED NEEDLE ASPIRATE BIOPSY OF THE THYROID GLAND MEDICATIONS: 1% Lidocaine. ANESTHESIA/SEDATION: No sedation medication given. COMPARISON:  Ultrasound done 10/01/2015. COMPLICATIONS: None immediate. PROCEDURE: Thyroid biopsy was thoroughly discussed with  the patient and questions were answered. The benefits, risks, alternatives, and complications were also discussed. The patient understands and wishes to proceed with the procedure. Written consent was obtained. Ultrasound was performed to localize and mark an adequate site for the biopsy. The patient was then prepped and draped in a normal sterile fashion. Local anesthesia was provided with 1% lidocaine. Using direct ultrasound guidance, 3 passes were made using needles into the nodules within the right inferior pole of the thyroid and within the left mid lobe of the thyroid. Ultrasound was used to confirm needle placements on all occasions. Specimens were sent to Pathology for analysis. IMPRESSION: Ultrasound guided needle aspirate biopsy performed of the right and left thyroid nodules.  Read by:  Gareth Eagle, PA-C Electronically Signed   By: Jacqulynn Cadet M.D.   On: 10/07/2015 11:22   US Thyroid  10/01/2015  CLINICAL DATA:  Nodule EXAM: THYROID ULTRASOUND TECHNIQUE: Ultrasound examination of the thyroid gland and adjacent soft tissues was performed. COMPARISON:  None. FINDINGS: Right thyroid lobe Measurements: 52 x 20 x 23 mm. Inhomogeneous background parenchyma. 15 x 12 x 14 mm solid nodule, superior pole with peripheral coarse calcification. 20 x 14 x 23 mm solid nodule, mid lobe. 20 x 17 x 29 mm solid nodule with coarse calcifications, inferior pole. Left thyroid lobe Measurements: 53 x 29 x 20 mm. Dominant 29 x 22 x 26 mm mostly solid nodule, mid lobe, with coarse calcifications. Adjacent 22 x 20 x 20 mm solid nodule, inferior pole. Isthmus Thickness: 5 mm.  No nodules visualized. Lymphadenopathy None visualized. IMPRESSION: 1. Thyromegaly with bilateral nodules which meet consensus criteria for biopsy. Ultrasound-guided fine needle aspiration should be considered, as per the consensus statement: Management of Thyroid Nodules Detected at [place]: Society of Radiologists in [place]. Radiology 2005; [id number]. Electronically Signed   By: Lucrezia Europe M.D.   On: 10/01/2015 16:06     CBC  Recent Labs Lab 10/27/15 1552  WBC 9.8  HGB 12.8  HCT 38.7  PLT 265  MCV 87.8  MCH 29.0  MCHC 33.1  RDW 13.2    Chemistries   Recent Labs Lab 10/27/15 1552  NA 137  K 4.3  CL 100*  CO2 25  GLUCOSE 277*  BUN 13  CREATININE 0.60  CALCIUM 9.3   ------------------------------------------------------------------------------------------------------------------ CrCl cannot be calculated (Unknown ideal weight.). ------------------------------------------------------------------------------------------------------------------ No results for input(s): HGBA1C in the last 72 hours. ------------------------------------------------------------------------------------------------------------------ No results for input(s): CHOL, HDL, LDLCALC, TRIG, CHOLHDL, LDLDIRECT in the last 72 hours. ------------------------------------------------------------------------------------------------------------------ No results for input(s): TSH, T4TOTAL, T3FREE, THYROIDAB in the last 72 hours.  Invalid input(s): FREET3 ------------------------------------------------------------------------------------------------------------------ No results for input(s): VITAMINB12, FOLATE, FERRITIN, TIBC, IRON, RETICCTPCT in the last 72 hours.  Coagulation profile No results for input(s): INR, PROTIME in the last 168 hours.  No results for input(s): DDIMER in the last 72 hours.  Cardiac Enzymes No results for input(s): CKMB, TROPONINI, MYOGLOBIN in the last 168 hours.  Invalid input(s): CK ------------------------------------------------------------------------------------------------------------------ Invalid input(s): POCBNP   CBG: No results for input(s): GLUCAP in the last 168 hours.        Assessment/Plan Cellulitis with possible abscess: Acute. Patient with a callus of the  left foot recently trimmed with acute erythema swelling. - Admit to MedSurg bed - Empiric antibiotics of vancomycin - Hydrocodone prn pain - Wound care consult - Orthopedics consult called to Dr. Sharol Given and they will see patient in a.m. to evaluate for possible need of I&D  Diabetes mellitus type 2 with hyperglycemia: Patient's initial CBG 277. - Hypoglycemic protocols - CBGs every before meals and at bedtime  - Continue home regimen of NovoLog 14 units subcutaneous with meals   Essential hypertension - Continue amlodipine, carvedilol, lisinopril,  CAD - Continue aspirin  Anxiety - continue Paxil  Hyperlipidemia - Continue atorvastatin  Lovenox for DVT prophylaxis   Code Status:   full Family Communication: bedside Disposition Plan: admit   Total time spent 55 minutes.Greater than 50% of this time was spent in counseling, explanation of diagnosis, planning of further management, and coordination of care  [facility] Pager [phone]  If 7PM-7AM, please contact night-coverage [email or web address] Password [facility] 10/27/2015, 8:09 PM

## 2015-10-27 NOTE — ED Notes (Signed)
Pt reports having a callous removed from bottom of left foot, now having pain, swelling, redness and drainage since Saturday.

## 2015-10-27 NOTE — Progress Notes (Signed)
Pharmacy Antibiotic Note  [name] is a 82 y.o. female admitted on 10/27/2015 with cellulitis.  Pharmacy has been consulted for vancomycin dosing. Pt is afebrile and WBC is WNL. Scr is also WNL.  Plan: - Vancomycin 1500mg  IV Q24H - F/u renal fxn, C&S, clinical status and trough at SS     Temp (24hrs), Avg:98.8 F (37.1 C), Min:98.1 F (36.7 C), Max:99.3 F (37.4 C)   Recent Labs Lab 10/27/15 1552  WBC 9.8  CREATININE 0.60    CrCl cannot be calculated (Unknown ideal weight.).    Allergies  Allergen Reactions  . Sulfa Antibiotics Nausea And Vomiting    Severe vomiting.     Antimicrobials this admission: Vanc 4/12>>  Dose adjustments this admission: N/A  Microbiology results: Pending  Thank you for allowing pharmacy to be a part of this patient's care.  Ashanna Heinsohn, Rande Lawman 10/27/2015 7:45 PM

## 2015-10-27 NOTE — ED Notes (Signed)
Admitting doctor at the bedside 

## 2015-10-27 NOTE — ED Provider Notes (Signed)
Complains of left foot swelling and pain onset of prostate 1 week ago. Her podiatrist drained some pus from the plantar aspect of her left foot earlier today by squeezing her foot sent her here for intravenous antibiotics. Pain is worse with weightbearing. She denies other associated symptoms.  Orlie Dakin, MD 10/28/15 Shelah Lewandowsky

## 2015-10-27 NOTE — ED Notes (Signed)
The pt has a swollen lt foot good pedal pulses

## 2015-10-27 NOTE — Patient Instructions (Signed)
Patient presents today with 2 day history of a painful swollen left forefoot. She has a history of peripheral arterial disease and diabetes I'm referring her to the emergency department today for evaluation and treatment for the cellulitis

## 2015-10-27 NOTE — ED Notes (Signed)
Report called to rn on 6 n 

## 2015-10-27 NOTE — ED Notes (Signed)
The pt is here for pain and swelling of her lt foot after a foot doctor removed a CALLUS on the 5th of march

## 2015-10-28 ENCOUNTER — Observation Stay (HOSPITAL_BASED_OUTPATIENT_CLINIC_OR_DEPARTMENT_OTHER): Payer: PPO

## 2015-10-28 ENCOUNTER — Observation Stay (HOSPITAL_COMMUNITY): Payer: PPO

## 2015-10-28 ENCOUNTER — Encounter (HOSPITAL_COMMUNITY): Payer: Self-pay | Admitting: General Practice

## 2015-10-28 ENCOUNTER — Other Ambulatory Visit (HOSPITAL_COMMUNITY): Payer: Self-pay | Admitting: Family

## 2015-10-28 DIAGNOSIS — Z794 Long term (current) use of insulin: Secondary | ICD-10-CM | POA: Diagnosis not present

## 2015-10-28 DIAGNOSIS — F419 Anxiety disorder, unspecified: Secondary | ICD-10-CM | POA: Diagnosis not present

## 2015-10-28 DIAGNOSIS — M79609 Pain in unspecified limb: Secondary | ICD-10-CM | POA: Diagnosis not present

## 2015-10-28 DIAGNOSIS — I2510 Atherosclerotic heart disease of native coronary artery without angina pectoris: Secondary | ICD-10-CM | POA: Diagnosis not present

## 2015-10-28 DIAGNOSIS — Z955 Presence of coronary angioplasty implant and graft: Secondary | ICD-10-CM | POA: Diagnosis not present

## 2015-10-28 DIAGNOSIS — L03019 Cellulitis of unspecified finger: Secondary | ICD-10-CM | POA: Diagnosis not present

## 2015-10-28 DIAGNOSIS — L97509 Non-pressure chronic ulcer of other part of unspecified foot with unspecified severity: Secondary | ICD-10-CM | POA: Diagnosis not present

## 2015-10-28 DIAGNOSIS — I771 Stricture of artery: Secondary | ICD-10-CM | POA: Diagnosis not present

## 2015-10-28 DIAGNOSIS — Z79899 Other long term (current) drug therapy: Secondary | ICD-10-CM | POA: Diagnosis not present

## 2015-10-28 DIAGNOSIS — Z882 Allergy status to sulfonamides status: Secondary | ICD-10-CM | POA: Diagnosis not present

## 2015-10-28 DIAGNOSIS — L02619 Cutaneous abscess of unspecified foot: Secondary | ICD-10-CM | POA: Diagnosis not present

## 2015-10-28 DIAGNOSIS — L03119 Cellulitis of unspecified part of limb: Secondary | ICD-10-CM | POA: Diagnosis not present

## 2015-10-28 DIAGNOSIS — M7989 Other specified soft tissue disorders: Secondary | ICD-10-CM | POA: Diagnosis not present

## 2015-10-28 DIAGNOSIS — E890 Postprocedural hypothyroidism: Secondary | ICD-10-CM | POA: Diagnosis not present

## 2015-10-28 DIAGNOSIS — I429 Cardiomyopathy, unspecified: Secondary | ICD-10-CM | POA: Diagnosis not present

## 2015-10-28 DIAGNOSIS — K219 Gastro-esophageal reflux disease without esophagitis: Secondary | ICD-10-CM | POA: Diagnosis not present

## 2015-10-28 DIAGNOSIS — L03116 Cellulitis of left lower limb: Secondary | ICD-10-CM | POA: Diagnosis not present

## 2015-10-28 DIAGNOSIS — Z7982 Long term (current) use of aspirin: Secondary | ICD-10-CM | POA: Diagnosis not present

## 2015-10-28 DIAGNOSIS — L97522 Non-pressure chronic ulcer of other part of left foot with fat layer exposed: Secondary | ICD-10-CM | POA: Diagnosis not present

## 2015-10-28 DIAGNOSIS — L02612 Cutaneous abscess of left foot: Secondary | ICD-10-CM | POA: Diagnosis not present

## 2015-10-28 DIAGNOSIS — E11621 Type 2 diabetes mellitus with foot ulcer: Secondary | ICD-10-CM | POA: Diagnosis not present

## 2015-10-28 DIAGNOSIS — E1165 Type 2 diabetes mellitus with hyperglycemia: Secondary | ICD-10-CM | POA: Diagnosis not present

## 2015-10-28 DIAGNOSIS — E119 Type 2 diabetes mellitus without complications: Secondary | ICD-10-CM | POA: Diagnosis not present

## 2015-10-28 DIAGNOSIS — L03115 Cellulitis of right lower limb: Secondary | ICD-10-CM | POA: Diagnosis not present

## 2015-10-28 DIAGNOSIS — I70213 Atherosclerosis of native arteries of extremities with intermittent claudication, bilateral legs: Secondary | ICD-10-CM | POA: Diagnosis not present

## 2015-10-28 DIAGNOSIS — E1142 Type 2 diabetes mellitus with diabetic polyneuropathy: Secondary | ICD-10-CM | POA: Diagnosis not present

## 2015-10-28 DIAGNOSIS — E782 Mixed hyperlipidemia: Secondary | ICD-10-CM | POA: Diagnosis not present

## 2015-10-28 DIAGNOSIS — R600 Localized edema: Secondary | ICD-10-CM | POA: Diagnosis not present

## 2015-10-28 DIAGNOSIS — I10 Essential (primary) hypertension: Secondary | ICD-10-CM | POA: Diagnosis not present

## 2015-10-28 LAB — COMPREHENSIVE METABOLIC PANEL
ALT: 12 U/L — ABNORMAL LOW (ref 14–54)
AST: 15 U/L (ref 15–41)
Albumin: 2.9 g/dL — ABNORMAL LOW (ref 3.5–5.0)
Alkaline Phosphatase: 58 U/L (ref 38–126)
Anion gap: 11 (ref 5–15)
BUN: 10 mg/dL (ref 6–20)
CO2: 23 mmol/L (ref 22–32)
Calcium: 8.4 mg/dL — ABNORMAL LOW (ref 8.9–10.3)
Chloride: 103 mmol/L (ref 101–111)
Creatinine, Ser: 0.53 mg/dL (ref 0.44–1.00)
GFR calc Af Amer: >60 mL/min (ref >60)
GFR calc non Af Amer: >60 mL/min (ref >60)
Glucose, Bld: 280 mg/dL — ABNORMAL HIGH (ref 65–99)
Potassium: 3.8 mmol/L (ref 3.5–5.1)
Sodium: 137 mmol/L (ref 135–145)
Total Bilirubin: 0.5 mg/dL (ref 0.3–1.2)
Total Protein: 6.1 g/dL — ABNORMAL LOW (ref 6.5–8.1)

## 2015-10-28 LAB — SURGICAL PCR SCREEN
MRSA, PCR: NEGATIVE
Staphylococcus aureus: NEGATIVE

## 2015-10-28 LAB — GLUCOSE, CAPILLARY
Glucose-Capillary: 261 mg/dL — ABNORMAL HIGH (ref 65–99)
Glucose-Capillary: 256 mg/dL — ABNORMAL HIGH (ref 65–99)
Glucose-Capillary: 301 mg/dL — ABNORMAL HIGH (ref 65–99)
Glucose-Capillary: 220 mg/dL — ABNORMAL HIGH (ref 65–99)
Glucose-Capillary: 152 mg/dL — ABNORMAL HIGH (ref 65–99)

## 2015-10-28 MED ORDER — INSULIN ASPART 100 UNIT/ML ~~LOC~~ SOLN
6.0000 [IU] | SUBCUTANEOUS | Status: AC
  Administered 2015-10-28: 6 [IU] via SUBCUTANEOUS

## 2015-10-28 MED ORDER — SODIUM CHLORIDE 0.9 % IV BOLUS (SEPSIS)
1000.0000 mL | Freq: Once | INTRAVENOUS | Status: AC
  Administered 2015-10-28: 1000 mL via INTRAVENOUS

## 2015-10-28 MED ORDER — CEFAZOLIN SODIUM-DEXTROSE 2-4 GM/100ML-% IV SOLN
2.0000 g | INTRAVENOUS | Status: DC
  Filled 2015-10-28: qty 100

## 2015-10-28 MED ORDER — GADOBENATE DIMEGLUMINE 529 MG/ML IV SOLN
15.0000 mL | Freq: Once | INTRAVENOUS | Status: AC | PRN
  Administered 2015-10-28: 15 mL via INTRAVENOUS

## 2015-10-28 NOTE — Care Management Obs Status (Signed)
[place] NOTIFICATION   Patient Details  Name: [name] MRN: [id number] Date of Birth: 04/12/1932   Medicare Observation Status Notification Given:  No, pt not in room when attempted to deliver    Delrae Sawyers, RN 10/28/2015, 12:45 PM

## 2015-10-28 NOTE — Progress Notes (Signed)
VASCULAR LAB PRELIMINARY  ARTERIAL  ABI completed: Findings are suggestive of abnormal ABI bilaterally. Right moderate arterial insufficiency and left severe arterial insufficiency. Per protocol, lower ext. Duplex arterial duplex was completed.     RIGHT    LEFT    PRESSURE WAVEFORM  PRESSURE WAVEFORM  BRACHIAL 123 Normal BRACHIAL 130 Normal  DP 82 Biphasic DP 54 monophasic  PT 87 Biphasic PT 42 monophasic  PER   PER    GREAT TOE  NA GREAT TOE  NA    RIGHT LEFT  ABI 0.67 0.42     Estelle Greenleaf D, RVT 10/28/2015, 4:16 PM

## 2015-10-28 NOTE — Progress Notes (Signed)
Results for CAROLEEN, SZASZ (MRN [id number]) as of 10/28/2015 13:14  Ref. Range 12/30/2014 09:44 12/30/2014 11:48 10/27/2015 23:05 10/28/2015 07:23 10/28/2015 09:54  Glucose-Capillary Latest Ref Range: 65-99 mg/dL 309 (H) 279 (H) 307 (H) 261 (H) 256 (H)  Noted that blood sugars continue to be elevated. Noted that patient is on Novolog 14 units TID here in the hospital as she takes at home. Recommend adding Novolog SENSITIVE correction scale TID & HS and have the Novolog as a meal coverage TID. Will continue to follow while in the hospital. Harvel Ricks RN BSN CDE

## 2015-10-28 NOTE — Progress Notes (Addendum)
PROGRESS NOTE                                                                                                                                                                                                             Patient Demographics:    [name], is a 82 y.o. female, DOB - 08/03/1931, [id number]  Admit date - 10/27/2015   Admitting Physician Norval Morton, MD  Outpatient Primary MD for the patient is Marton Redwood, MD  LOS - day 2   Chief Complaint  Patient presents with  . Foot Swelling  . Cellulitis       Brief Narrative     Subjective:    [name] Has no new reported complaints. No acute issues reported overnight.   Assessment  & Plan :    Principal Problem:   Cellulitis - On Vancomycin - Ortho on board and assisting  Active Problems:   Coronary artery disease -on aspirin and statin    Hypertension - Pt on amlodipine and lisinopril at home. Will hold given active infection and recent reported hypotension.    Diabetes mellitus without complication ([place]) - continue carb modified diet - currently on novolog    Mixed hyperlipidemia - stable no chest pain reported. Continue statin  Code Status : full  Family Communication  : discussed directly with patient.  Disposition Plan  : pending PT evaluation  Barriers For Discharge : Pending ortho recommendations and improvement in infected left foot  Consults  :  Ortho  Procedures  : none  DVT Prophylaxis  :  Lovenox   Lab Results  Component Value Date   PLT 265 10/27/2015    Antibiotics  :  Vancomycin  Anti-infectives    Start     Dose/Rate Route Frequency Ordered Stop   10/28/15 2100  vancomycin (VANCOCIN) 1,500 mg in sodium chloride 0.9 % 500 mL IVPB     1,500 mg 250 mL/hr over 120 Minutes Intravenous Every 24 hours 10/27/15 1944     10/27/15 1945  vancomycin (VANCOCIN) 1,500 mg in sodium chloride 0.9 % 500 mL IVPB     1,500 mg 250 mL/hr over 120 Minutes Intravenous  Once 10/27/15 1938 10/27/15 2230        Objective:   Filed Vitals:   10/27/15 2132 10/28/15 0500 10/28/15 1012 10/28/15 1158  BP:  143/75 135/54 85/43 116/49  Pulse: 108 86 68 73  Temp: 98.3 F (36.8 C) 97.9 F (36.6 C) 98.5 F (36.9 C)   TempSrc: Oral Oral Oral   Resp: 19 19 18 18   Height: 5\' 8"  (1.727 m)     Weight: 79.107 kg (174 lb 6.4 oz)     SpO2: 98% 98% 97% 98%    Wt Readings from Last 3 Encounters:  10/27/15 79.107 kg (174 lb 6.4 oz)  07/06/15 81.829 kg (180 lb 6.4 oz)  01/05/15 81.557 kg (179 lb 12.8 oz)     Intake/Output Summary (Last 24 hours) at 10/28/15 1305 Last data filed at 10/28/15 1204  Gross per 24 hour  Intake      0 ml  Output    250 ml  Net   -250 ml     Physical Exam  GEN: Awake Alert, Oriented X 3, No new F.N deficits, Normal affect HEENT: Supple Neck,No JVD, No cervical lymphadenopathy appriciated.  Pulm: Symmetrical Chest wall movement, Good air movement bilaterally, CTAB CV: RRR,No Gallops,Rubs or new Murmurs,  ABD: + B.Sounds, Abd Soft, No tenderness, No organomegaly appriciated, No rebound - guarding or rigidity. Musculoskeletal: No Cyanosis, Clubbing or edema    Data Review:    CBC  Recent Labs Lab 10/27/15 1552  WBC 9.8  HGB 12.8  HCT 38.7  PLT 265  MCV 87.8  MCH 29.0  MCHC 33.1  RDW 13.2    Chemistries   Recent Labs Lab 10/27/15 1552 10/28/15 0512  NA 137 137  K 4.3 3.8  CL 100* 103  CO2 25 23  GLUCOSE 277* 280*  BUN 13 10  CREATININE 0.60 0.53  CALCIUM 9.3 8.4*  AST  --  15  ALT  --  12*  ALKPHOS  --  58  BILITOT  --  0.5   ------------------------------------------------------------------------------------------------------------------ No results for input(s): CHOL, HDL, LDLCALC, TRIG, CHOLHDL, LDLDIRECT in the last 72 hours.  No results found for:  HGBA1C ------------------------------------------------------------------------------------------------------------------ No results for input(s): TSH, T4TOTAL, T3FREE, THYROIDAB in the last 72 hours.  Invalid input(s): FREET3 ------------------------------------------------------------------------------------------------------------------ No results for input(s): VITAMINB12, FOLATE, FERRITIN, TIBC, IRON, RETICCTPCT in the last 72 hours.  Coagulation profile No results for input(s): INR, PROTIME in the last 168 hours.  No results for input(s): DDIMER in the last 72 hours.  Cardiac Enzymes No results for input(s): CKMB, TROPONINI, MYOGLOBIN in the last 168 hours.  Invalid input(s): CK ------------------------------------------------------------------------------------------------------------------ No results found for: BNP  Inpatient Medications  Scheduled Meds: . amLODipine  10 mg Oral Daily  . aspirin  325 mg Oral QHS  . atorvastatin  20 mg Oral Daily  . carvedilol  25 mg Oral BID WC  . enoxaparin (LOVENOX) injection  40 mg Subcutaneous QHS  . insulin aspart  14 Units Subcutaneous TID WC  . lisinopril  20 mg Oral Q supper  . pantoprazole  40 mg Oral Daily  . PARoxetine  20 mg Oral Q breakfast  . vancomycin  1,500 mg Intravenous Q24H   Continuous Infusions:  PRN Meds:.acetaminophen **OR** acetaminophen, HYDROcodone-acetaminophen, nitroGLYCERIN, ondansetron **OR** ondansetron (ZOFRAN) IV  Micro Results No results found for this or any previous visit (from the past 240 hour(s)).  Radiology Reports US Thyroid Biopsy  10/07/2015  INDICATION: Multinodular goiter with history of previous thyroidectomy and radioactive iodine. Request for ultrasound guided fine needle aspirate biopsy of the right inferior pole thyroid nodule which measures 20 x 17 x 29 mm and contains coarse calcifications and the left  mid lobe thyroid nodule measuring 29 x 22 x 26 mm which is mostly solid and  contains coarse calcifications. EXAM: ULTRASOUND GUIDED NEEDLE ASPIRATE BIOPSY OF THE THYROID GLAND MEDICATIONS: 1% Lidocaine. ANESTHESIA/SEDATION: No sedation medication given. COMPARISON:  Ultrasound done 10/01/2015. COMPLICATIONS: None immediate. PROCEDURE: Thyroid biopsy was thoroughly discussed with the patient and questions were answered. The benefits, risks, alternatives, and complications were also discussed. The patient understands and wishes to proceed with the procedure. Written consent was obtained. Ultrasound was performed to localize and mark an adequate site for the biopsy. The patient was then prepped and draped in a normal sterile fashion. Local anesthesia was provided with 1% lidocaine. Using direct ultrasound guidance, 3 passes were made using needles into the nodules within the right inferior pole of the thyroid and within the left mid lobe of the thyroid. Ultrasound was used to confirm needle placements on all occasions. Specimens were sent to Pathology for analysis. IMPRESSION: Ultrasound guided needle aspirate biopsy performed of the right and left thyroid nodules. Read by:  Gareth Eagle, PA-C Electronically Signed   By: Jacqulynn Cadet M.D.   On: 10/07/2015 11:22   US Thyroid Biopsy  10/07/2015  INDICATION: Multinodular goiter with history of previous thyroidectomy and radioactive iodine. Request for ultrasound guided fine needle aspirate biopsy of the right inferior pole thyroid nodule which measures 20 x 17 x 29 mm and contains coarse calcifications and the left mid lobe thyroid nodule measuring 29 x 22 x 26 mm which is mostly solid and contains coarse calcifications. EXAM: ULTRASOUND GUIDED NEEDLE ASPIRATE BIOPSY OF THE THYROID GLAND MEDICATIONS: 1% Lidocaine. ANESTHESIA/SEDATION: No sedation medication given. COMPARISON:  Ultrasound done 10/01/2015. COMPLICATIONS: None immediate. PROCEDURE: Thyroid biopsy was thoroughly discussed with the patient and questions were answered. The  benefits, risks, alternatives, and complications were also discussed. The patient understands and wishes to proceed with the procedure. Written consent was obtained. Ultrasound was performed to localize and mark an adequate site for the biopsy. The patient was then prepped and draped in a normal sterile fashion. Local anesthesia was provided with 1% lidocaine. Using direct ultrasound guidance, 3 passes were made using needles into the nodules within the right inferior pole of the thyroid and within the left mid lobe of the thyroid. Ultrasound was used to confirm needle placements on all occasions. Specimens were sent to Pathology for analysis. IMPRESSION: Ultrasound guided needle aspirate biopsy performed of the right and left thyroid nodules. Read by:  Gareth Eagle, PA-C Electronically Signed   By: Jacqulynn Cadet M.D.   On: 10/07/2015 11:22   US Thyroid  10/01/2015  CLINICAL DATA:  Nodule EXAM: THYROID ULTRASOUND TECHNIQUE: Ultrasound examination of the thyroid gland and adjacent soft tissues was performed. COMPARISON:  None. FINDINGS: Right thyroid lobe Measurements: 52 x 20 x 23 mm. Inhomogeneous background parenchyma. 15 x 12 x 14 mm solid nodule, superior pole with peripheral coarse calcification. 20 x 14 x 23 mm solid nodule, mid lobe. 20 x 17 x 29 mm solid nodule with coarse calcifications, inferior pole. Left thyroid lobe Measurements: 53 x 29 x 20 mm. Dominant 29 x 22 x 26 mm mostly solid nodule, mid lobe, with coarse calcifications. Adjacent 22 x 20 x 20 mm solid nodule, inferior pole. Isthmus Thickness: 5 mm.  No nodules visualized. Lymphadenopathy None visualized. IMPRESSION: 1. Thyromegaly with bilateral nodules which meet consensus criteria for biopsy. Ultrasound-guided fine needle aspiration should be considered, as per the consensus statement: Management of Thyroid Nodules Detected at [place]: Society of  Radiologists in Ultrasound Consensus Conference Statement. Radiology 2005; [id number].  Electronically Signed   By: Lucrezia Europe M.D.   On: 10/01/2015 16:06    Time Spent in minutes  25   Velvet Bathe M.D on 10/28/2015 at 1:05 PM  Between 7am to 7pm - Pager - [phone]  After 7pm go to [email or web address] - password [facility]  [facility] -  Office  [phone]

## 2015-10-28 NOTE — Progress Notes (Signed)
Preliminary results by tech - Arterial Duplex Lower Ext. Completed per abnormal ABI protocol. Right leg, moderate athercerotic plaque noted in the right SFA with greater than 50% stenosis. Right ATA, high grade stenosis at the distal segment, PTA appeared occluded and peroneal artery demonstrates patent flow with monophasic waveforms. Left leg, long segment of diffuse athercerotic plaque with greater than 80% stenosis, PTA and proximal ATA appeared occluded.  Results given to patient's nurse Christy.  Oda Cogan, BS, RDMS, RVT

## 2015-10-28 NOTE — Consult Note (Signed)
  ORTHOPAEDIC CONSULTATION  REQUESTING PHYSICIAN: Orlando Vega, MD  Chief Complaint: Painful Purulent ulcer plantar aspect left foot  HPI: [name] is a 82 y.o. female who presents with a draining ulcer from the plantar aspect left foot beneath the second and third metatarsal heads. Patient complains of cellulitis pain swelling and drainage.  Past Medical History  Diagnosis Date  . Complication of anesthesia   . PONV (postoperative nausea and vomiting)   . Coronary artery disease   . Hypertension   . Hypothyroidism   . Anxiety   . Shortness of breath   . Diabetes mellitus without complication (H[facility])     insulin dependent  . GERD (gastroesophageal reflux disease)   . Neuromuscular disorder (H[facility])     muscle cramps to lower extremities  . Other primary cardiomyopathies    Past Surgical History  Procedure Laterality Date  . Tonsillectomy    . Abdominal hysterectomy    . Cholecystectomy    . Thyroid surgery      radioactive iodine   . Coronary angioplasty with stent placement  08/09/2011    DES  to mid circumflex  . Left heart catheterization with coronary angiogram N/A 08/08/2012    Procedure: LEFT HEART CATHETERIZATION WITH CORONARY ANGIOGRAM;  Surgeon: Jayadeep S Varanasi, MD;  Location: [facility] CATH LAB;  Service: Cardiovascular;  Laterality: N/A;  . Peripheral vascular catheterization N/A 12/30/2014    Procedure: Lower Extremity Angiography;  Surgeon: Muhammad A Arida, MD;  Location: [facility] INVASIVE CV LAB;  Service: Cardiovascular;  Laterality: N/A;   Social History   Social History  . Marital Status: Married    Spouse Name: N/A  . Number of Children: N/A  . Years of Education: N/A   Social History Main Topics  . Smoking status: Never Smoker   . Smokeless tobacco: Never Used  . Alcohol Use: Yes     Comment: rare  . Drug Use: No  . Sexual Activity: Not Asked   Other Topics Concern  . None   Social History Narrative   Family History  Problem Relation Age of  Onset  . Heart disease Father   . Diabetes Sister    - negative except otherwise stated in the family history section Allergies  Allergen Reactions  . Sulfa Antibiotics Nausea And Vomiting    Severe vomiting.    Prior to Admission medications   Medication Sig Start Date End Date Taking? Authorizing Provider  amLODipine (NORVASC) 10 MG tablet TAKE ONE TABLET BY MOUTH ONCE DAILY 09/09/15  Yes Jayadeep S Varanasi, MD  aspirin 325 MG EC tablet Take 325 mg by mouth at bedtime.    Yes Historical Provider, MD  atorvastatin (LIPITOR) 20 MG tablet Take 1 tablet (20 mg total) by mouth daily. 10/21/14  Yes Jayadeep S Varanasi, MD  B Complex-C (SUPER B COMPLEX PO) Take 1 tablet by mouth daily.   Yes Historical Provider, MD  carvedilol (COREG) 25 MG tablet Take 1 tablet (25 mg total) by mouth 2 (two) times daily with a meal. 07/06/15  Yes Muhammad A Arida, MD  Cholecalciferol (VITAMIN D PO) Take 1 tablet by mouth daily.   Yes Historical Provider, MD  lisinopril (PRINIVIL,ZESTRIL) 20 MG tablet Take 20 mg by mouth daily with supper.    Yes Historical Provider, MD  Multiple Vitamin (MULTIVITAMIN) capsule Take 1 capsule by mouth daily.   Yes Historical Provider, MD  NITROSTAT 0.4 MG SL tablet PLACE 1 TABLET UNDER THE TONGUE EVERY 5 MINUTES AS NEEDED FOR   CHEST PAIN 06/14/15  Yes Jayadeep S Varanasi, MD  NOVOLOG FLEXPEN 100 UNIT/ML FlexPen Inject 14 Units into the skin 3 (three) times daily with meals.  07/29/15  Yes Historical Provider, MD  Omega-3 Fatty Acids (FISH OIL) 1200 MG CAPS Take 2,400 mg by mouth 2 (two) times daily.   Yes Historical Provider, MD  pantoprazole (PROTONIX) 40 MG tablet TAKE ONE TABLET BY MOUTH ONCE DAILY AT  6  AM 08/11/15  Yes Jayadeep S Varanasi, MD  PARoxetine (PAXIL) 20 MG tablet Take 20 mg by mouth daily with breakfast.    Yes Historical Provider, MD  VITAMIN E PO Take 1 tablet by mouth daily.   Yes Historical Provider, MD   No results found. - pertinent xrays, CT, MRI studies were  reviewed and independently interpreted  Positive ROS: All other systems have been reviewed and were otherwise negative with the exception of those mentioned in the HPI and as above.  Physical Exam: General: Alert, no acute distress Cardiovascular: Venous stasis insufficiency left leg with swelling Respiratory: No cyanosis, no use of accessory musculature GI: No organomegaly, abdomen is soft and non-tender Skin: Large Wagner grade 1 ulcer beneath the second and third metatarsal head left foot. Patient does not have palpable pulses. Neurologic: Patient does not have protective sensation Psychiatric: Patient is competent for consent with normal mood and affect Lymphatic: No axillary or cervical lymphadenopathy  MUSCULOSKELETAL:  On examination patient does not have a palpable dorsalis pedis pulse she has venous stasis changes in her leg she has cellulitis around the second and third metatarsals. There is a large ulcer beneath the second and third metatarsal head.  Assessment: Assessment: Diabetic insensate neuropathy Wagner grade 3 ulcer left foot plantar aspect second and third metatarsal head.  Plan: Plan: After informed consent scissors were used to debride the skin and soft tissue from the ulcer. The ulcer was 2 cm in diameter 1 cm deep. The ulcer probed all the way down to the second and third metatarsal head there is a purulent draining wound from the second and third metatarsal head.  Will obtain MRI scan of the left foot to evaluate for osteomyelitis. We will obtain ankle-brachial indices patient does not have a palpable pulse. Possible surgery on Friday pending the results of the MRI scan.  Thank you for the consult and the opportunity to see [name]  Kendalyn Cranfield, MD [facility] [phone] 8:21 AM     

## 2015-10-28 NOTE — Progress Notes (Signed)
   10/28/15 1012  Vitals  Temp 98.5 F (36.9 C)  Temp Source Oral  BP (!) 85/43 mmHg  BP Location Right Arm  BP Method Automatic  Patient Position (if appropriate) Lying  Pulse Rate 68  Pulse Rate Source Dinamap  Resp 18  Oxygen Therapy  SpO2 97 %  O2 Device Room Air    Dr. Wendee Beavers texted paged.

## 2015-10-28 NOTE — Care Management Obs Status (Signed)
[place] NOTIFICATION   Patient Details  Name: SAMON PTACEK MRN: [id number] Date of Birth: 07-14-1932   Medicare Observation Status Notification Given:  Yes    Nila Nephew, RN 10/28/2015, 2:08 PM

## 2015-10-29 ENCOUNTER — Inpatient Hospital Stay (HOSPITAL_COMMUNITY): Payer: PPO | Admitting: Anesthesiology

## 2015-10-29 ENCOUNTER — Encounter (HOSPITAL_COMMUNITY)
Admission: EM | Disposition: A | Discharge status: Home / Self Care | Payer: Self-pay | Source: Home / Self Care | Attending: Family Medicine

## 2015-10-29 DIAGNOSIS — L03119 Cellulitis of unspecified part of limb: Secondary | ICD-10-CM

## 2015-10-29 HISTORY — PX: I & D EXTREMITY: SHX5045

## 2015-10-29 LAB — GLUCOSE, CAPILLARY
Glucose-Capillary: 241 mg/dL — ABNORMAL HIGH (ref 65–99)
Glucose-Capillary: 199 mg/dL — ABNORMAL HIGH (ref 65–99)
Glucose-Capillary: 199 mg/dL — ABNORMAL HIGH (ref 65–99)
Glucose-Capillary: 290 mg/dL — ABNORMAL HIGH (ref 65–99)
Glucose-Capillary: 261 mg/dL — ABNORMAL HIGH (ref 65–99)

## 2015-10-29 SURGERY — IRRIGATION AND DEBRIDEMENT EXTREMITY
Anesthesia: Regional | Laterality: Left

## 2015-10-29 MED ORDER — BUPIVACAINE HCL (PF) 0.5 % IJ SOLN
INTRAMUSCULAR | Status: DC | PRN
  Administered 2015-10-29: 35 mL

## 2015-10-29 MED ORDER — MIDAZOLAM HCL 2 MG/2ML IJ SOLN: 0.5000 mg | Freq: Once | INTRAMUSCULAR | Status: DC | PRN

## 2015-10-29 MED ORDER — ACETAMINOPHEN 325 MG PO TABS: 650.0000 mg | ORAL_TABLET | Freq: Four times a day (QID) | ORAL | Status: DC | PRN

## 2015-10-29 MED ORDER — ACETAMINOPHEN 650 MG RE SUPP
650.0000 mg | Freq: Four times a day (QID) | RECTAL | Status: DC | PRN
Start: 2015-10-29 — End: 2015-10-29

## 2015-10-29 MED ORDER — ONDANSETRON HCL 4 MG/2ML IJ SOLN
INTRAMUSCULAR | Status: AC
  Filled 2015-10-29: qty 2

## 2015-10-29 MED ORDER — ONDANSETRON HCL 4 MG/2ML IJ SOLN
INTRAMUSCULAR | Status: DC | PRN
  Administered 2015-10-29: 4 mg via INTRAVENOUS

## 2015-10-29 MED ORDER — CHLORHEXIDINE GLUCONATE 4 % EX LIQD: 60.0000 mL | Freq: Once | CUTANEOUS | Status: DC

## 2015-10-29 MED ORDER — HYDROMORPHONE HCL 1 MG/ML IJ SOLN: 1.0000 mg | INTRAMUSCULAR | Status: DC | PRN

## 2015-10-29 MED ORDER — FENTANYL CITRATE (PF) 100 MCG/2ML IJ SOLN
INTRAMUSCULAR | Status: AC
  Administered 2015-10-29: 50 ug via INTRAVENOUS
  Filled 2015-10-29: qty 2

## 2015-10-29 MED ORDER — MEPERIDINE HCL 25 MG/ML IJ SOLN: 6.2500 mg | INTRAMUSCULAR | Status: DC | PRN

## 2015-10-29 MED ORDER — METOCLOPRAMIDE HCL 5 MG PO TABS: 5.0000 mg | ORAL_TABLET | Freq: Three times a day (TID) | ORAL | Status: DC | PRN

## 2015-10-29 MED ORDER — SODIUM CHLORIDE 0.9 % IV SOLN
INTRAVENOUS | Status: DC
  Administered 2015-10-29: 14:00:00 via INTRAVENOUS

## 2015-10-29 MED ORDER — METHOCARBAMOL 1000 MG/10ML IJ SOLN: 500.0000 mg | Freq: Four times a day (QID) | INTRAVENOUS | Status: DC | PRN

## 2015-10-29 MED ORDER — ONDANSETRON HCL 4 MG/2ML IJ SOLN: 4.0000 mg | Freq: Four times a day (QID) | INTRAMUSCULAR | Status: DC | PRN

## 2015-10-29 MED ORDER — PROPOFOL 10 MG/ML IV BOLUS
INTRAVENOUS | Status: AC
  Filled 2015-10-29: qty 20

## 2015-10-29 MED ORDER — CEFAZOLIN SODIUM 1-5 GM-% IV SOLN
1.0000 g | Freq: Four times a day (QID) | INTRAVENOUS | Status: AC
  Administered 2015-10-29 – 2015-10-30 (×3): 1 g via INTRAVENOUS
  Filled 2015-10-29 (×3): qty 50

## 2015-10-29 MED ORDER — PROPOFOL 500 MG/50ML IV EMUL
INTRAVENOUS | Status: DC | PRN
  Administered 2015-10-29: 50 ug/kg/min via INTRAVENOUS

## 2015-10-29 MED ORDER — MIDAZOLAM HCL 2 MG/2ML IJ SOLN
INTRAMUSCULAR | Status: AC
  Administered 2015-10-29: 1 mg
  Filled 2015-10-29: qty 2

## 2015-10-29 MED ORDER — LIDOCAINE HCL (CARDIAC) 20 MG/ML IV SOLN
INTRAVENOUS | Status: AC
  Filled 2015-10-29: qty 5

## 2015-10-29 MED ORDER — HYDROMORPHONE HCL 1 MG/ML IJ SOLN: 0.2500 mg | INTRAMUSCULAR | Status: DC | PRN

## 2015-10-29 MED ORDER — METHOCARBAMOL 500 MG PO TABS
500.0000 mg | ORAL_TABLET | Freq: Four times a day (QID) | ORAL | Status: DC | PRN
  Administered 2015-10-29: 500 mg via ORAL
  Filled 2015-10-29: qty 1

## 2015-10-29 MED ORDER — LACTATED RINGERS IV SOLN
INTRAVENOUS | Status: DC
  Administered 2015-10-29: 11:00:00 via INTRAVENOUS

## 2015-10-29 MED ORDER — FENTANYL CITRATE (PF) 250 MCG/5ML IJ SOLN
INTRAMUSCULAR | Status: AC
  Filled 2015-10-29: qty 5

## 2015-10-29 MED ORDER — OXYCODONE HCL 5 MG PO TABS
5.0000 mg | ORAL_TABLET | ORAL | Status: DC | PRN
  Administered 2015-10-29 – 2015-10-30 (×3): 10 mg via ORAL
  Filled 2015-10-29 (×3): qty 2

## 2015-10-29 MED ORDER — ONDANSETRON HCL 4 MG PO TABS: 4.0000 mg | ORAL_TABLET | Freq: Four times a day (QID) | ORAL | Status: DC | PRN

## 2015-10-29 MED ORDER — PROPOFOL 10 MG/ML IV BOLUS
INTRAVENOUS | Status: DC | PRN
  Administered 2015-10-29: 10 mg via INTRAVENOUS

## 2015-10-29 MED ORDER — 0.9 % SODIUM CHLORIDE (POUR BTL) OPTIME
TOPICAL | Status: DC | PRN
  Administered 2015-10-29: 1000 mL

## 2015-10-29 MED ORDER — PROPOFOL 500 MG/50ML IV EMUL
INTRAVENOUS | Status: AC
  Filled 2015-10-29: qty 50

## 2015-10-29 MED ORDER — METOCLOPRAMIDE HCL 5 MG/ML IJ SOLN: 5.0000 mg | Freq: Three times a day (TID) | INTRAMUSCULAR | Status: DC | PRN

## 2015-10-29 SURGICAL SUPPLY — 40 items
BLADE SURG 10 STRL SS (BLADE) IMPLANT
BLADE SURG 21 STRL SS (BLADE) ×2 IMPLANT
BNDG COHESIVE 4X5 TAN STRL (GAUZE/BANDAGES/DRESSINGS) IMPLANT
BNDG COHESIVE 6X5 TAN STRL LF (GAUZE/BANDAGES/DRESSINGS) ×1 IMPLANT
BNDG GAUZE ELAST 4 BULKY (GAUZE/BANDAGES/DRESSINGS) ×2 IMPLANT
COVER SURGICAL LIGHT HANDLE (MISCELLANEOUS) ×3 IMPLANT
DRAPE U-SHAPE 47X51 STRL (DRAPES) ×2 IMPLANT
DRESSING ADAPTIC 1/2  N-ADH (PACKING) ×1 IMPLANT
DRSG ADAPTIC 3X8 NADH LF (GAUZE/BANDAGES/DRESSINGS) ×2 IMPLANT
DURAPREP 26ML APPLICATOR (WOUND CARE) ×2 IMPLANT
ELECT CAUTERY BLADE 6.4 (BLADE) IMPLANT
ELECT REM PT RETURN 9FT ADLT (ELECTROSURGICAL)
ELECTRODE REM PT RTRN 9FT ADLT (ELECTROSURGICAL) IMPLANT
GAUZE SPONGE 4X4 12PLY STRL (GAUZE/BANDAGES/DRESSINGS) ×2 IMPLANT
GLOVE BIOGEL PI IND STRL 9 (GLOVE) ×1 IMPLANT
GLOVE BIOGEL PI INDICATOR 9 (GLOVE) ×1
GLOVE SURG ORTHO 9.0 STRL STRW (GLOVE) ×2 IMPLANT
GOWN STRL REUS W/ TWL LRG LVL3 (GOWN DISPOSABLE) ×1 IMPLANT
GOWN STRL REUS W/ TWL XL LVL3 (GOWN DISPOSABLE) ×2 IMPLANT
GOWN STRL REUS W/TWL LRG LVL3 (GOWN DISPOSABLE) ×2
GOWN STRL REUS W/TWL XL LVL3 (GOWN DISPOSABLE) ×4
HANDPIECE INTERPULSE COAX TIP (DISPOSABLE)
KIT BASIN OR (CUSTOM PROCEDURE TRAY) ×2 IMPLANT
KIT ROOM TURNOVER OR (KITS) ×2 IMPLANT
MANIFOLD NEPTUNE II (INSTRUMENTS) ×2 IMPLANT
NS IRRIG 1000ML POUR BTL (IV SOLUTION) ×2 IMPLANT
PACK ORTHO EXTREMITY (CUSTOM PROCEDURE TRAY) ×2 IMPLANT
PAD ABD 8X10 STRL (GAUZE/BANDAGES/DRESSINGS) ×1 IMPLANT
PAD ARMBOARD 7.5X6 YLW CONV (MISCELLANEOUS) ×4 IMPLANT
SET HNDPC FAN SPRY TIP SCT (DISPOSABLE) IMPLANT
SPONGE GAUZE 4X4 12PLY STER LF (GAUZE/BANDAGES/DRESSINGS) ×1 IMPLANT
STOCKINETTE IMPERVIOUS 9X36 MD (GAUZE/BANDAGES/DRESSINGS) IMPLANT
SWAB COLLECTION DEVICE MRSA (MISCELLANEOUS) ×1 IMPLANT
TOWEL OR 17X24 6PK STRL BLUE (TOWEL DISPOSABLE) ×2 IMPLANT
TOWEL OR 17X26 10 PK STRL BLUE (TOWEL DISPOSABLE) ×2 IMPLANT
TUBE ANAEROBIC SPECIMEN COL (MISCELLANEOUS) IMPLANT
TUBE CONNECTING 12X1/4 (SUCTIONS) ×2 IMPLANT
UNDERPAD 30X30 INCONTINENT (UNDERPADS AND DIAPERS) ×2 IMPLANT
WATER STERILE IRR 1000ML POUR (IV SOLUTION) ×1 IMPLANT
YANKAUER SUCT BULB TIP NO VENT (SUCTIONS) ×2 IMPLANT

## 2015-10-29 NOTE — H&P (View-Only) (Signed)
ORTHOPAEDIC CONSULTATION  REQUESTING PHYSICIAN: Velvet Bathe, MD  Chief Complaint: Painful Purulent ulcer plantar aspect left foot  HPI: [name] is a 82 y.o. female who presents with a draining ulcer from the plantar aspect left foot beneath the second and third metatarsal heads. Patient complains of cellulitis pain swelling and drainage.  Past Medical History  Diagnosis Date  . Complication of anesthesia   . PONV (postoperative nausea and vomiting)   . Coronary artery disease   . Hypertension   . Hypothyroidism   . Anxiety   . Shortness of breath   . Diabetes mellitus without complication (HCC)     insulin dependent  . GERD (gastroesophageal reflux disease)   . Neuromuscular disorder (HCC)     muscle cramps to lower extremities  . Other primary cardiomyopathies    Past Surgical History  Procedure Laterality Date  . Tonsillectomy    . Abdominal hysterectomy    . Cholecystectomy    . Thyroid surgery      radioactive iodine   . Coronary angioplasty with stent placement  08/09/2011    DES  to mid circumflex  . Left heart catheterization with coronary angiogram N/A 08/08/2012    Procedure: LEFT HEART CATHETERIZATION WITH CORONARY ANGIOGRAM;  Surgeon: [name], MD;  Location: [facility] CATH LAB;  Service: Cardiovascular;  Laterality: N/A;  . Peripheral vascular catheterization N/A 12/30/2014    Procedure: Lower Extremity Angiography;  Surgeon: Wellington Hampshire, MD;  Location: [facility];  Service: Cardiovascular;  Laterality: N/A;   Social History   Social History  . Marital Status: Married    Spouse Name: N/A  . Number of Children: N/A  . Years of Education: N/A   Social History Main Topics  . Smoking status: Never Smoker   . Smokeless tobacco: Never Used  . Alcohol Use: Yes     Comment: rare  . Drug Use: No  . Sexual Activity: Not Asked   Other Topics Concern  . None   Social History Narrative   Family History  Problem Relation Age of  Onset  . Heart disease Father   . Diabetes Sister    - negative except otherwise stated in the family history section Allergies  Allergen Reactions  . Sulfa Antibiotics Nausea And Vomiting    Severe vomiting.    Prior to Admission medications   Medication Sig Start Date End Date Taking? Authorizing Provider  amLODipine (NORVASC) 10 MG tablet TAKE ONE TABLET BY MOUTH ONCE DAILY 09/09/15  Yes [name], MD  aspirin 325 MG EC tablet Take 325 mg by mouth at bedtime.    Yes Historical Provider, MD  atorvastatin (LIPITOR) 20 MG tablet Take 1 tablet (20 mg total) by mouth daily. 10/21/14  Yes [name], MD  B Complex-C (SUPER B COMPLEX PO) Take 1 tablet by mouth daily.   Yes Historical Provider, MD  carvedilol (COREG) 25 MG tablet Take 1 tablet (25 mg total) by mouth 2 (two) times daily with a meal. 07/06/15  Yes Wellington Hampshire, MD  Cholecalciferol (VITAMIN D PO) Take 1 tablet by mouth daily.   Yes Historical Provider, MD  lisinopril (PRINIVIL,ZESTRIL) 20 MG tablet Take 20 mg by mouth daily with supper.    Yes Historical Provider, MD  Multiple Vitamin (MULTIVITAMIN) capsule Take 1 capsule by mouth daily.   Yes Historical Provider, MD  NITROSTAT 0.4 MG SL tablet PLACE 1 TABLET UNDER THE TONGUE EVERY 5 MINUTES AS NEEDED FOR  CHEST PAIN 06/14/15  Yes [name], MD  NOVOLOG FLEXPEN 100 UNIT/ML FlexPen Inject 14 Units into the skin 3 (three) times daily with meals.  07/29/15  Yes Historical Provider, MD  Omega-3 Fatty Acids (FISH OIL) 1200 MG CAPS Take 2,400 mg by mouth 2 (two) times daily.   Yes Historical Provider, MD  pantoprazole (PROTONIX) 40 MG tablet TAKE ONE TABLET BY MOUTH ONCE DAILY AT  6  AM 08/11/15  Yes [name], MD  PARoxetine (PAXIL) 20 MG tablet Take 20 mg by mouth daily with breakfast.    Yes Historical Provider, MD  VITAMIN E PO Take 1 tablet by mouth daily.   Yes Historical Provider, MD   No results found. - pertinent xrays, CT, MRI studies were  reviewed and independently interpreted  Positive ROS: All other systems have been reviewed and were otherwise negative with the exception of those mentioned in the HPI and as above.  Physical Exam: General: Alert, no acute distress Cardiovascular: Venous stasis insufficiency left leg with swelling Respiratory: No cyanosis, no use of accessory musculature GI: No organomegaly, abdomen is soft and non-tender Skin: Large Wagner grade 1 ulcer beneath the second and third metatarsal head left foot. Patient does not have palpable pulses. Neurologic: Patient does not have protective sensation Psychiatric: Patient is competent for consent with normal mood and affect Lymphatic: No axillary or cervical lymphadenopathy  MUSCULOSKELETAL:  On examination patient does not have a palpable dorsalis pedis pulse she has venous stasis changes in her leg she has cellulitis around the second and third metatarsals. There is a large ulcer beneath the second and third metatarsal head.  Assessment: Assessment: Diabetic insensate neuropathy Wagner grade 3 ulcer left foot plantar aspect second and third metatarsal head.  Plan: Plan: After informed consent scissors were used to debride the skin and soft tissue from the ulcer. The ulcer was 2 cm in diameter 1 cm deep. The ulcer probed all the way down to the second and third metatarsal head there is a purulent draining wound from the second and third metatarsal head.  Will obtain MRI scan of the left foot to evaluate for osteomyelitis. We will obtain ankle-brachial indices patient does not have a palpable pulse. Possible surgery on Friday pending the results of the MRI scan.  Thank you for the consult and the opportunity to see Ms. Jake Church, MD [place] [phone] 8:21 AM

## 2015-10-29 NOTE — Anesthesia Preprocedure Evaluation (Addendum)
Anesthesia Evaluation  Patient identified by MRN, date of birth, ID band Patient awake    Reviewed: Allergy & Precautions, NPO status , Patient's Chart, lab work & pertinent test results, reviewed documented beta blocker date and time   History of Anesthesia Complications (+) PONV and history of anesthetic complications  Airway Mallampati: II  TM Distance: >3 FB Neck ROM: Full    Dental  (+) Dental Advisory Given   Pulmonary neg pulmonary ROS,    breath sounds clear to auscultation       Cardiovascular hypertension, Pt. on medications and Pt. on home beta blockers (-) angina+ CAD and + Cardiac Stents   Rhythm:Regular Rate:Normal  '14 ECHO: EF 40-45%, mild inferolat hypokinesis, mod MR   Neuro/Psych Peripheral neuropathy    GI/Hepatic Neg liver ROS, GERD  Medicated and Controlled,  Endo/Other  diabetes (glu 199), Insulin DependentHypothyroidism   Renal/GU negative Renal ROS     Musculoskeletal   Abdominal   Peds  Hematology   Anesthesia Other Findings   Reproductive/Obstetrics                          Anesthesia Physical Anesthesia Plan  ASA: III  Anesthesia Plan: Regional   Post-op Pain Management:    Induction: Intravenous  Airway Management Planned: Natural Airway and Nasal Cannula  Additional Equipment:   Intra-op Plan:   Post-operative Plan:   Informed Consent: I have reviewed the patients History and Physical, chart, labs and discussed the procedure including the risks, benefits and alternatives for the proposed anesthesia with the patient or authorized representative who has indicated his/her understanding and acceptance.   Dental advisory given  Plan Discussed with: CRNA and Surgeon  Anesthesia Plan Comments: (Plan routine monitors, ankle block)        Anesthesia Quick Evaluation

## 2015-10-29 NOTE — Progress Notes (Signed)
Patient underwent excision of the abscess plantar aspect of her left foot. Anticipate she can be discharged to home tomorrow Saturday. I reviewed patient's ankle-brachial indices which showed significant stenosis at the superficial femoral artery bilaterally as well as occlusion distally at the ankle. Patient states that she has seen Dr. Inda Merlin for vascular intervention for her claudication and she will follow up with him. I will follow-up in the office in 1 week.

## 2015-10-29 NOTE — Anesthesia Postprocedure Evaluation (Signed)
Anesthesia Post Note  Patient: [name]  Procedure(s) Performed: Procedure(s) (LRB): Irrigation and Debridement Left Foot (Left)  Patient location during evaluation: PACU Anesthesia Type: Regional Level of consciousness: awake and alert, oriented and patient cooperative Pain management: pain level controlled Vital Signs Assessment: post-procedure vital signs reviewed and stable Respiratory status: spontaneous breathing, nonlabored ventilation and respiratory function stable Cardiovascular status: blood pressure returned to baseline and stable Postop Assessment: no signs of nausea or vomiting Anesthetic complications: no    Last Vitals:  Filed Vitals:   10/29/15 1245 10/29/15 1253  BP:  100/57  Pulse: 58 56  Temp:    Resp: 18 17    Last Pain:  Filed Vitals:   10/29/15 1256  PainSc: 0-No pain                 Lyfe Reihl,E. Rand Etchison

## 2015-10-29 NOTE — Op Note (Signed)
10/27/2015 - 10/29/2015  12:10 PM  PATIENT:  [name]    PRE-OPERATIVE DIAGNOSIS:  Abscess 2 and 3 Metatarsal Head Left Foot  POST-OPERATIVE DIAGNOSIS:  Same  PROCEDURE:  Irrigation and Debridement Left Foot with excision of skin soft tissue and tendon. Local tissue rearrangement for wound closure 1 x 4 cm  SURGEON:  Newt Minion, MD  PHYSICIAN ASSISTANT:None ANESTHESIA:   General  PREOPERATIVE INDICATIONS:  [name] is a  82 y.o. female with a diagnosis of Abscess 2 and 3 Metatarsal Head Left Foot who failed conservative measures and elected for surgical management.    The risks benefits and alternatives were discussed with the patient preoperatively including but not limited to the risks of infection, bleeding, nerve injury, cardiopulmonary complications, the need for revision surgery, among others, and the patient was willing to proceed.  OPERATIVE IMPLANTS: None  OPERATIVE FINDINGS: Soft tissue abscess approximately 1 cm in diameter  OPERATIVE PROCEDURE: Patient brought the operating room and underwent a regional block. After adequate levels anesthesia obtained patient's left lower extremity was prepped using DuraPrep draped into a sterile field a timeout was called. An elliptical incision was made around the draining abscess. The necrotic tissue was excised. 21 blade knife was used to remove skin soft tissue tendon. Local tissue rearrangement was used to perform wound closure after the wound was irrigated with normal saline. The wound was closed using 2-0 nylon. A sterile compressive dressing was applied.  Patient was taken to the PACU in stable condition.  Patient may discharge to home tomorrow Saturday touchdown weightbearing on the left oral antibiotics for 4 weeks.

## 2015-10-29 NOTE — Transfer of Care (Signed)
Immediate Anesthesia Transfer of Care Note  Patient: [name]  Procedure(s) Performed: Procedure(s): Irrigation and Debridement Left Foot (Left)  Patient Location: PACU  Anesthesia Type:MAC  Level of Consciousness: awake and alert   Airway & Oxygen Therapy: Patient Spontanous Breathing  Post-op Assessment: Report given to RN and Post -op Vital signs reviewed and stable  Post vital signs: Reviewed and stable  Last Vitals:  Filed Vitals:   10/29/15 0541 10/29/15 1140  BP: 147/50 157/52  Pulse: 69 70  Temp: 36.4 C   Resp: 19     Complications: No apparent anesthesia complications

## 2015-10-29 NOTE — Progress Notes (Signed)
222Orthopedic Tech Progress Note Patient Details:  [name] [birth year] [id number]  Ortho Devices Type of Ortho Device: CAM walker Ortho Device/Splint Interventions: Application   [name] 10/29/2015, 1:48 PM

## 2015-10-29 NOTE — Progress Notes (Addendum)
PROGRESS NOTE                                                                                                                                                                                                             Patient Demographics:    [name], is a 82 y.o. female, DOB - 07-28-1931, [id number]  Admit date - 10/27/2015   Admitting Physician Norval Morton, MD  Outpatient Primary MD for the patient is Marton Redwood, MD  LOS - 1day 2   Chief Complaint  Patient presents with  . Foot Swelling  . Cellulitis       Brief Narrative     Subjective:    [name] Has no new reported complaints. No acute issues reported overnight.   Assessment  & Plan :    Principal Problem:   Cellulitis - On Vancomycin, will transition to oral antibiotics most likely next am. - Ortho on board and assisting  Active Problems:   Coronary artery disease -on aspirin and statin    Hypertension - Pt on amlodipine and lisinopril at home. Will hold given active infection and recent reported hypotension.    Diabetes mellitus without complication ([place]) - continue carb modified diet - currently on novolog    Mixed hyperlipidemia - stable no chest pain reported. Continue statin  Code Status : full  Family Communication  : discussed directly with patient.  Disposition Plan  : pending PT evaluation  Barriers For Discharge : Pending ortho recommendations and improvement in infected left foot  Consults  :  Ortho  Procedures  : addendum: excision of the abscess plantar aspect of her left foot  DVT Prophylaxis  :  Lovenox   Lab Results  Component Value Date   PLT 265 10/27/2015    Antibiotics  :  Vancomycin  Anti-infectives    Start     Dose/Rate Route Frequency Ordered Stop   10/29/15 1400  ceFAZolin (ANCEF) IVPB 1 g/50 mL premix     1 g 100 mL/hr over 30 Minutes Intravenous Every 6 hours 10/29/15 1327 10/30/15  0759   10/29/15 1230  ceFAZolin (ANCEF) IVPB 2g/100 mL premix  Status:  Discontinued     2 g 200 mL/hr over 30 Minutes Intravenous To ShortStay Surgical 10/28/15 1507 10/29/15 1320   10/28/15 2100  vancomycin (VANCOCIN) 1,500 mg in  sodium chloride 0.9 % 500 mL IVPB     1,500 mg 250 mL/hr over 120 Minutes Intravenous Every 24 hours 10/27/15 1944     10/27/15 1945  vancomycin (VANCOCIN) 1,500 mg in sodium chloride 0.9 % 500 mL IVPB     1,500 mg 250 mL/hr over 120 Minutes Intravenous  Once 10/27/15 1938 10/27/15 2230        Objective:   Filed Vitals:   10/29/15 1254 10/29/15 1300 10/29/15 1322 10/29/15 1400  BP:   108/59 134/51  Pulse:  61  66  Temp: 97.3 F (36.3 C)  97.9 F (36.6 C) 98 F (36.7 C)  TempSrc:    Oral  Resp:  20 16 16   Height:      Weight:      SpO2:  98% 95% 97%    Wt Readings from Last 3 Encounters:  10/27/15 79.107 kg (174 lb 6.4 oz)  07/06/15 81.829 kg (180 lb 6.4 oz)  01/05/15 81.557 kg (179 lb 12.8 oz)     Intake/Output Summary (Last 24 hours) at 10/29/15 1613 Last data filed at 10/29/15 1425  Gross per 24 hour  Intake   1620 ml  Output    703 ml  Net    917 ml     Physical Exam  GEN: Awake Alert, Oriented X 3, No new F.N deficits, Normal affect HEENT: Supple Neck,No JVD, No cervical lymphadenopathy appriciated.  Pulm: Symmetrical Chest wall movement, Good air movement bilaterally, CTAB CV: RRR,No Gallops,Rubs or new Murmurs,  ABD: + B.Sounds, Abd Soft, No tenderness, No organomegaly appriciated, No rebound - guarding or rigidity. Musculoskeletal: No Cyanosis, Clubbing or edema    Data Review:    CBC  Recent Labs Lab 10/27/15 1552  WBC 9.8  HGB 12.8  HCT 38.7  PLT 265  MCV 87.8  MCH 29.0  MCHC 33.1  RDW 13.2    Chemistries   Recent Labs Lab 10/27/15 1552 10/28/15 0512  NA 137 137  K 4.3 3.8  CL 100* 103  CO2 25 23  GLUCOSE 277* 280*  BUN 13 10  CREATININE 0.60 0.53  CALCIUM 9.3 8.4*  AST  --  15  ALT  --  12*    ALKPHOS  --  58  BILITOT  --  0.5   ------------------------------------------------------------------------------------------------------------------ No results for input(s): CHOL, HDL, LDLCALC, TRIG, CHOLHDL, LDLDIRECT in the last 72 hours.  No results found for: HGBA1C ------------------------------------------------------------------------------------------------------------------ No results for input(s): TSH, T4TOTAL, T3FREE, THYROIDAB in the last 72 hours.  Invalid input(s): FREET3 ------------------------------------------------------------------------------------------------------------------ No results for input(s): VITAMINB12, FOLATE, FERRITIN, TIBC, IRON, RETICCTPCT in the last 72 hours.  Coagulation profile No results for input(s): INR, PROTIME in the last 168 hours.  No results for input(s): DDIMER in the last 72 hours.  Cardiac Enzymes No results for input(s): CKMB, TROPONINI, MYOGLOBIN in the last 168 hours.  Invalid input(s): CK ------------------------------------------------------------------------------------------------------------------ No results found for: BNP  Inpatient Medications  Scheduled Meds: . aspirin  325 mg Oral QHS  . atorvastatin  20 mg Oral Daily  . carvedilol  25 mg Oral BID WC  .  ceFAZolin (ANCEF) IV  1 g Intravenous Q6H  . enoxaparin (LOVENOX) injection  40 mg Subcutaneous QHS  . insulin aspart  14 Units Subcutaneous TID WC  . pantoprazole  40 mg Oral Daily  . PARoxetine  20 mg Oral Q breakfast  . vancomycin  1,500 mg Intravenous Q24H   Continuous Infusions: . sodium chloride 10 mL/hr at 10/29/15 1425  .  lactated ringers 10 mL/hr at 10/29/15 1052   PRN Meds:.acetaminophen **OR** acetaminophen, HYDROcodone-acetaminophen, HYDROmorphone (DILAUDID) injection, methocarbamol **OR** methocarbamol (ROBAXIN)  IV, metoCLOPramide **OR** metoCLOPramide (REGLAN) injection, nitroGLYCERIN, ondansetron **OR** ondansetron (ZOFRAN) IV,  oxyCODONE  Micro Results Recent Results (from the past 240 hour(s))  Blood culture (routine x 2)     Status: None (Preliminary result)   Collection Time: 10/27/15  8:39 PM  Result Value Ref Range Status   Specimen Description BLOOD RIGHT ANTECUBITAL  Final   Special Requests BOTTLES DRAWN AEROBIC AND ANAEROBIC 5CC  Final   Culture NO GROWTH 2 DAYS  Final   Report Status PENDING  Incomplete  Blood culture (routine x 2)     Status: None (Preliminary result)   Collection Time: 10/27/15  8:44 PM  Result Value Ref Range Status   Specimen Description BLOOD RIGHT HAND  Final   Special Requests BOTTLES DRAWN AEROBIC AND ANAEROBIC 5CC  Final   Culture NO GROWTH 2 DAYS  Final   Report Status PENDING  Incomplete  Surgical pcr screen     Status: None   Collection Time: 10/28/15  5:03 PM  Result Value Ref Range Status   MRSA, PCR NEGATIVE NEGATIVE Final   Staphylococcus aureus NEGATIVE NEGATIVE Final    Comment:        The Xpert SA Assay (FDA approved for NASAL specimens in patients over 21 years of age), is one component of a comprehensive surveillance program.  Test performance has been validated by [facility] for patients greater than or equal to 35 year old. It is not intended to diagnose infection nor to guide or monitor treatment.     Radiology Reports Mr Foot Left W Wo Contrast  10/28/2015  CLINICAL DATA:  History diabetes.  Left foot pain and swelling. EXAM: MRI OF THE LEFT FOREFOOT WITHOUT AND WITH CONTRAST TECHNIQUE: Multiplanar, multisequence MR imaging was performed both before and after administration of intravenous contrast. CONTRAST:  63mL MULTIHANCE GADOBENATE DIMEGLUMINE 529 MG/ML IV SOLN COMPARISON:  None. FINDINGS: Mild generalized soft tissue edema along the dorsal aspect. No focal fluid collection or hematoma. No bone destruction or periosteal reaction. No acute fracture or dislocation. No significant joint effusion. Mild osteoarthritis of the first MTP joint. No  other marrow signal abnormality. Osteoarthritis of the talonavicular joint and navicular cuneiform joints. IMPRESSION: 1. No evidence of osteomyelitis of the left forefoot. 2. Mild soft tissue edema along the dorsal aspect of the forefoot likely reflecting mild cellulitis. No drainable fluid collection. Electronically Signed   By: Kathreen Devoid   On: 10/28/2015 13:30   US Thyroid Biopsy  10/07/2015  INDICATION: Multinodular goiter with history of previous thyroidectomy and radioactive iodine. Request for ultrasound guided fine needle aspirate biopsy of the right inferior pole thyroid nodule which measures 20 x 17 x 29 mm and contains coarse calcifications and the left mid lobe thyroid nodule measuring 29 x 22 x 26 mm which is mostly solid and contains coarse calcifications. EXAM: ULTRASOUND GUIDED NEEDLE ASPIRATE BIOPSY OF THE THYROID GLAND MEDICATIONS: 1% Lidocaine. ANESTHESIA/SEDATION: No sedation medication given. COMPARISON:  Ultrasound done 10/01/2015. COMPLICATIONS: None immediate. PROCEDURE: Thyroid biopsy was thoroughly discussed with the patient and questions were answered. The benefits, risks, alternatives, and complications were also discussed. The patient understands and wishes to proceed with the procedure. Written consent was obtained. Ultrasound was performed to localize and mark an adequate site for the biopsy. The patient was then prepped and draped in a normal sterile fashion. Local anesthesia was provided  with 1% lidocaine. Using direct ultrasound guidance, 3 passes were made using needles into the nodules within the right inferior pole of the thyroid and within the left mid lobe of the thyroid. Ultrasound was used to confirm needle placements on all occasions. Specimens were sent to Pathology for analysis. IMPRESSION: Ultrasound guided needle aspirate biopsy performed of the right and left thyroid nodules. Read by:  Gareth Eagle, PA-C Electronically Signed   By: Jacqulynn Cadet M.D.   On:  10/07/2015 11:22   US Thyroid Biopsy  10/07/2015  INDICATION: Multinodular goiter with history of previous thyroidectomy and radioactive iodine. Request for ultrasound guided fine needle aspirate biopsy of the right inferior pole thyroid nodule which measures 20 x 17 x 29 mm and contains coarse calcifications and the left mid lobe thyroid nodule measuring 29 x 22 x 26 mm which is mostly solid and contains coarse calcifications. EXAM: ULTRASOUND GUIDED NEEDLE ASPIRATE BIOPSY OF THE THYROID GLAND MEDICATIONS: 1% Lidocaine. ANESTHESIA/SEDATION: No sedation medication given. COMPARISON:  Ultrasound done 10/01/2015. COMPLICATIONS: None immediate. PROCEDURE: Thyroid biopsy was thoroughly discussed with the patient and questions were answered. The benefits, risks, alternatives, and complications were also discussed. The patient understands and wishes to proceed with the procedure. Written consent was obtained. Ultrasound was performed to localize and mark an adequate site for the biopsy. The patient was then prepped and draped in a normal sterile fashion. Local anesthesia was provided with 1% lidocaine. Using direct ultrasound guidance, 3 passes were made using needles into the nodules within the right inferior pole of the thyroid and within the left mid lobe of the thyroid. Ultrasound was used to confirm needle placements on all occasions. Specimens were sent to Pathology for analysis. IMPRESSION: Ultrasound guided needle aspirate biopsy performed of the right and left thyroid nodules. Read by:  Gareth Eagle, PA-C Electronically Signed   By: Jacqulynn Cadet M.D.   On: 10/07/2015 11:22   US Thyroid  10/01/2015  CLINICAL DATA:  Nodule EXAM: THYROID ULTRASOUND TECHNIQUE: Ultrasound examination of the thyroid gland and adjacent soft tissues was performed. COMPARISON:  None. FINDINGS: Right thyroid lobe Measurements: 52 x 20 x 23 mm. Inhomogeneous background parenchyma. 15 x 12 x 14 mm solid nodule, superior pole with  peripheral coarse calcification. 20 x 14 x 23 mm solid nodule, mid lobe. 20 x 17 x 29 mm solid nodule with coarse calcifications, inferior pole. Left thyroid lobe Measurements: 53 x 29 x 20 mm. Dominant 29 x 22 x 26 mm mostly solid nodule, mid lobe, with coarse calcifications. Adjacent 22 x 20 x 20 mm solid nodule, inferior pole. Isthmus Thickness: 5 mm.  No nodules visualized. Lymphadenopathy None visualized. IMPRESSION: 1. Thyromegaly with bilateral nodules which meet consensus criteria for biopsy. Ultrasound-guided fine needle aspiration should be considered, as per the consensus statement: Management of Thyroid Nodules Detected at [place]: Society of Radiologists in [place]. Radiology 2005; [id number]. Electronically Signed   By: Lucrezia Europe M.D.   On: 10/01/2015 16:06    Time Spent in minutes  25   Velvet Bathe M.D on 10/29/2015 at 4:13 PM  Between 7am to 7pm - Pager - [phone]  After 7pm go to [email or web address] - password [email or web address]  [facility] -  Office  [phone]

## 2015-10-29 NOTE — Interval H&P Note (Signed)
History and Physical Interval Note:  10/29/2015 6:44 AM  [name]  has presented today for surgery, with the diagnosis of Abscess 2 and 3 Metatarsal Head Left Foot  The various methods of treatment have been discussed with the patient and family. After consideration of risks, benefits and other options for treatment, the patient has consented to  Procedure(s): Irrigation and Debridement Left Foot (Left) as a surgical intervention .  The patient's history has been reviewed, patient examined, no change in status, stable for surgery.  I have reviewed the patient's chart and labs.  Questions were answered to the patient's satisfaction.     Tyreese Thain V

## 2015-10-29 NOTE — Progress Notes (Signed)
Consent for I&D unassigned as pt wants to speak to MD performing surgery before she signs per night nurse

## 2015-10-29 NOTE — Anesthesia Procedure Notes (Signed)
Anesthesia Regional Block:  Ankle block  Pre-Anesthetic Checklist: ,, timeout performed, Correct Patient, Correct Site, Correct Laterality, Correct Procedure, Correct Position, site marked, Risks and benefits discussed,  Surgical consent,  Pre-op evaluation,  At surgeon's request and post-op pain management  Laterality: Left and Lower  Prep: chloraprep       Needles:  Injection technique: Single-shot  Needle Type: Quincke     Needle Length: 4cm 4 cm Needle Gauge: 25 and 25 G    Additional Needles:  Procedures: other  (perineural infiltration) Ankle block Narrative:  Start time: 10/29/2015 11:36 AM End time: 10/29/2015 11:42 AM Injection made incrementally with aspirations every 5 mL.  Performed by: Personally  Anesthesiologist: Glennon Mac, [facility]  Additional Notes: Pt identified in Holding room.  Monitors applied. Working IV access confirmed. Sterile prep L ankle.  #25ga infiltration with 0.5% Bupivacaine around deep and sup peroneal, saph, sural and post tib nerves. Total 35cc. Marland Kitchen  Patient asymptomatic, VSS, no heme aspirated, tolerated well.  Jenita Seashore, MD

## 2015-10-29 NOTE — Consult Note (Signed)
  Review of patient's ankle-brachial indices shows severe peripheral vascular disease with significant occlusion involving the anterior tibial artery and posterior tibial tendon artery at the ankle on the right as well as proximal stenosis of both the right left superficial femoral artery.  Recommend obtaining vascular vein surgery consultation.

## 2015-10-29 NOTE — Progress Notes (Signed)
Pt attempted to ambulate on unit with walker. Noted to be placing too much weight on left leg. Therapy will work with gait training before discharge

## 2015-10-30 DIAGNOSIS — E119 Type 2 diabetes mellitus without complications: Secondary | ICD-10-CM

## 2015-10-30 DIAGNOSIS — L03115 Cellulitis of right lower limb: Secondary | ICD-10-CM

## 2015-10-30 LAB — GLUCOSE, CAPILLARY
Glucose-Capillary: 267 mg/dL — ABNORMAL HIGH (ref 65–99)
Glucose-Capillary: 203 mg/dL — ABNORMAL HIGH (ref 65–99)

## 2015-10-30 MED ORDER — DOXYCYCLINE HYCLATE 100 MG PO TABS: 100.0000 mg | ORAL_TABLET | Freq: Two times a day (BID) | ORAL | Status: DC

## 2015-10-30 MED ORDER — HYDROCODONE-ACETAMINOPHEN 5-325 MG PO TABS: 1.0000 | ORAL_TABLET | ORAL | Status: DC | PRN

## 2015-10-30 MED ORDER — DOXYCYCLINE HYCLATE 100 MG PO TABS
100.0000 mg | ORAL_TABLET | Freq: Two times a day (BID) | ORAL | Status: DC
  Administered 2015-10-30: 100 mg via ORAL
  Filled 2015-10-30: qty 1

## 2015-10-30 NOTE — Evaluation (Signed)
Physical Therapy Evaluation Patient Details Name: [name] MRN: [id number] DOB: 18-Oct-1931 Today's Date: 10/30/2015   History of Present Illness  [name] is a 82 y.o. female who presents with a draining ulcer from the plantar aspect left foot beneath the second and third metatarsal heads. Patient complains of cellulitis pain swelling and drainage. Pt underwent I and D of left foot on 10/29/15. PMH: CAD, HTN, DM, GERD  Clinical Impression  Pt admitted with above diagnosis. Pt currently with functional limitations due to the deficits listed below (see PT Problem List). Pt quite impulsive and having difficulty understanding the concept of TDWB and keeping it consistently. Son present for session and showed adequate understanding. Pt ambulated 25' with RW and supervision.  Pt will benefit from skilled PT to increase their independence and safety with mobility to allow discharge to the venue listed below.       Follow Up Recommendations No PT follow up    Equipment Recommendations  None recommended by PT    Recommendations for Other Services       Precautions / Restrictions Precautions Precautions: None Required Braces or Orthoses: Other Brace/Splint Other Brace/Splint: CAM boot left Restrictions Weight Bearing Restrictions: Yes LLE Weight Bearing: Touchdown weight bearing      Mobility  Bed Mobility Overal bed mobility: Modified Independent                Transfers Overall transfer level: Needs assistance Equipment used: Rolling walker (2 wheeled) Transfers: Sit to/from Stand Sit to Stand: Min assist         General transfer comment: pt was sitting on loveseat in room which is low with only one arm rest within reach at a time and required min A to rise from that surface, supervision from recliner. vc's for sitting and standing for keeping LLE TDWB  Ambulation/Gait Ambulation/Gait assistance: Supervision Ambulation Distance (Feet): 25 Feet Assistive device:  Rolling walker (2 wheeled) Gait Pattern/deviations: Step-to pattern Gait velocity: decreased   General Gait Details: pt having difficulty understanding the concept of TDWB as well as keeping it with ambulation, improved with practice and vc's. Son educated as well.   Stairs Stairs:  (discussed ascension of stairs with pt and son)          Engineer, building services [place] (Stroke Patients Only)       Balance Overall balance assessment: Needs assistance Sitting-balance support: No upper extremity supported Sitting balance-Leahy Scale: Normal     Standing balance support: No upper extremity supported Standing balance-Leahy Scale: Good                               Pertinent Vitals/Pain Pain Assessment: 0-10 Pain Score: 3  Pain Location: left foot Pain Descriptors / Indicators: Stabbing Pain Intervention(s): Limited activity within patient's tolerance;Repositioned;Monitored during session;Premedicated before session    Home Living Family/patient expects to be discharged to:: Private residence Living Arrangements: Spouse/significant other Available Help at Discharge: Family;Available PRN/intermittently Type of Home: House Home Access: Stairs to enter Entrance Stairs-Rails: Can reach both;Right;Left Entrance Stairs-Number of Steps: 4 Home Layout: One level Home Equipment: Walker - 2 wheels Additional Comments: pt lives with her husband who has dementia, he is independent but she has to supervise him. Son lives 10 minutes away and can help them at discharge and help with grocery shopping    Prior Function Level of Independence: Independent  Comments: pt borrowing RW from a family member     Hand Dominance        Extremity/Trunk Assessment   Upper Extremity Assessment: Overall WFL for tasks assessed           Lower Extremity Assessment: Overall WFL for tasks assessed      Cervical / Trunk Assessment: Normal  Communication    Communication: No difficulties  Cognition Arousal/Alertness: Awake/alert Behavior During Therapy: Impulsive;WFL for tasks assessed/performed Overall Cognitive Status: Within Functional Limits for tasks assessed                      General Comments General comments (skin integrity, edema, etc.): pt is used to be very independent and though she could still do cooking, laundry, etc. Went over the concept of TDWB and need for use of RW to keep this and how that would hinder these daily activities. Pt began to verbalize understanding by end of session.     Exercises        Assessment/Plan    PT Assessment Patient needs continued PT services  PT Diagnosis Difficulty walking;Acute pain   PT Problem List Decreased mobility;Decreased knowledge of use of DME;Decreased knowledge of precautions;Decreased safety awareness;Pain  PT Treatment Interventions DME instruction;Gait training;Stair training;Functional mobility training;Therapeutic activities;Therapeutic exercise;Patient/family education   PT Goals (Current goals can be found in the Care Plan section) Acute Rehab PT Goals Patient Stated Goal: return home ASAP PT Goal Formulation: With patient Time For Goal Achievement: 11/13/15 Potential to Achieve Goals: Good    Frequency Min 3X/week   Barriers to discharge Decreased caregiver support pt is caregiver for husband, son can check on her but does not live with her    Co-evaluation               End of Session Equipment Utilized During Treatment: Gait belt Activity Tolerance: Patient tolerated treatment well Patient left: in chair;with call bell/phone within reach;with family/visitor present Nurse Communication: Mobility status         Time: 1228-1257 PT Time Calculation (min) (ACUTE ONLY): 29 min   Charges:   PT Evaluation $PT Eval Moderate Complexity: 1 Procedure PT Treatments $Gait Training: 8-22 mins   PT G Codes:      Leighton Roach, PT  Acute  Rehab Services  [place], [place] 10/30/2015, 1:47 PM

## 2015-10-30 NOTE — Discharge Summary (Signed)
Physician Discharge Summary  [name] [id number] DOB: 31-Jul-1931 DOA: 10/27/2015  PCP: Marton Redwood, MD  Admit date: 10/27/2015 Discharge date: 10/30/2015  Time spent: 35 minutes  Recommendations for Outpatient Follow-up:  1. Please see d/c instructions below   Discharge Diagnoses:  Principal Problem:   Cellulitis Active Problems:   Coronary artery disease   Hypertension   Diabetes mellitus without complication ([place])   Mixed hyperlipidemia   Discharge Condition: stable  Diet recommendation: carb modified diet  Filed Weights   10/27/15 2132  Weight: 79.107 kg (174 lb 6.4 oz)    History of present illness:  82 y.o. female who presents with a draining ulcer from the plantar aspect left foot beneath the second and third metatarsal heads. Patient complains of  pain swelling and drainage.  Hospital Course:  Cellulitis - improved on vancomycin. Will d/c on doxycycline for 5 more days to complete a 7 day treatment course. - Pt to f/u with ortho in 1 week  Active Problems:  Coronary artery disease -on aspirin and statin   Hypertension - Pt on amlodipine and lisinopril at home. Will hold given active infection and recent reported hypotension. - d/c on B blocker   Diabetes mellitus without complication ([place]) - continue carb modified diet - currently on novolog   Mixed hyperlipidemia - stable no chest pain reported. Continue statin  Procedures: excision of the abscess plantar aspect of her left foot  Consultations:  Dr. Sharol Given with orthopaedic surgery  Discharge Exam: Filed Vitals:   10/29/15 1830 10/29/15 2042  BP: 130/54 135/48  Pulse: 78 84  Temp: 98.6 F (37 C) 98.5 F (36.9 C)  Resp:  19    General: Pt in nad, alert and awake Cardiovascular: rrr, no rubs Respiratory: no increased wob, no wheezes  Discharge Instructions   Discharge Instructions    Call MD for:  difficulty breathing, headache or visual disturbances    Complete by:  As  directed      Call MD for:  severe uncontrolled pain    Complete by:  As directed      Call MD for:  temperature >100.4    Complete by:  As directed      Diet - low sodium heart healthy    Complete by:  As directed      Discharge instructions    Complete by:  As directed   Please follow up with your orthopaedic surgeon and your vascular surgeon in 1-2 weeks or sooner should any new concerns arise.     For home use only DME Crutches    Complete by:  As directed      Increase activity slowly    Complete by:  As directed      Post-op shoe    Complete by:  As directed      Touch down weight bearing    Complete by:  As directed   Laterality:  left  Extremity:  Lower          Current Discharge Medication List    START taking these medications   Details  doxycycline (VIBRA-TABS) 100 MG tablet Take 1 tablet (100 mg total) by mouth every 12 (twelve) hours. Qty: 10 tablet, Refills: 0    HYDROcodone-acetaminophen (NORCO/VICODIN) 5-325 MG tablet Take 1 tablet by mouth every 4 (four) hours as needed for moderate pain. Qty: 30 tablet, Refills: 0      CONTINUE these medications which have NOT CHANGED   Details  aspirin 325 MG EC tablet  Take 325 mg by mouth at bedtime.     atorvastatin (LIPITOR) 20 MG tablet Take 1 tablet (20 mg total) by mouth daily. Qty: 90 tablet, Refills: 0    B Complex-C (SUPER B COMPLEX PO) Take 1 tablet by mouth daily.    carvedilol (COREG) 25 MG tablet Take 1 tablet (25 mg total) by mouth 2 (two) times daily with a meal. Qty: 180 tablet, Refills: 3    Cholecalciferol (VITAMIN D PO) Take 1 tablet by mouth daily.    Multiple Vitamin (MULTIVITAMIN) capsule Take 1 capsule by mouth daily.    NITROSTAT 0.4 MG SL tablet PLACE 1 TABLET UNDER THE TONGUE EVERY 5 MINUTES AS NEEDED FOR CHEST PAIN Qty: 25 tablet, Refills: 1    NOVOLOG FLEXPEN 100 UNIT/ML FlexPen Inject 14 Units into the skin 3 (three) times daily with meals.     Omega-3 Fatty Acids (FISH OIL) 1200  MG CAPS Take 2,400 mg by mouth 2 (two) times daily.    pantoprazole (PROTONIX) 40 MG tablet TAKE ONE TABLET BY MOUTH ONCE DAILY AT  6  AM Qty: 30 tablet, Refills: 3    PARoxetine (PAXIL) 20 MG tablet Take 20 mg by mouth daily with breakfast.     VITAMIN E PO Take 1 tablet by mouth daily.      STOP taking these medications     amLODipine (NORVASC) 10 MG tablet      lisinopril (PRINIVIL,ZESTRIL) 20 MG tablet        Allergies  Allergen Reactions  . Sulfa Antibiotics Nausea And Vomiting    Severe vomiting.    Follow-up Information    Follow up with DUDA,MARCUS V, MD In 1 week.   Specialty:  Orthopedic Surgery   Contact information:   [place] [address] [phone]        The results of significant diagnostics from this hospitalization (including imaging, microbiology, ancillary and laboratory) are listed below for reference.    Significant Diagnostic Studies: Mr Foot Left W Wo Contrast  10/28/2015  CLINICAL DATA:  History diabetes.  Left foot pain and swelling. EXAM: MRI OF THE LEFT FOREFOOT WITHOUT AND WITH CONTRAST TECHNIQUE: Multiplanar, multisequence MR imaging was performed both before and after administration of intravenous contrast. CONTRAST:  38m MULTIHANCE GADOBENATE DIMEGLUMINE 529 MG/ML IV SOLN COMPARISON:  None. FINDINGS: Mild generalized soft tissue edema along the dorsal aspect. No focal fluid collection or hematoma. No bone destruction or periosteal reaction. No acute fracture or dislocation. No significant joint effusion. Mild osteoarthritis of the first MTP joint. No other marrow signal abnormality. Osteoarthritis of the talonavicular joint and navicular cuneiform joints. IMPRESSION: 1. No evidence of osteomyelitis of the left forefoot. 2. Mild soft tissue edema along the dorsal aspect of the forefoot likely reflecting mild cellulitis. No drainable fluid collection. Electronically Signed   By: HKathreen Devoid  On: 10/28/2015 13:30   [place] Thyroid Biopsy  10/07/2015  INDICATION: Multinodular goiter with history of previous thyroidectomy and radioactive iodine. Request for ultrasound guided fine needle aspirate biopsy of the right inferior pole thyroid nodule which measures 20 x 17 x 29 mm and contains coarse calcifications and the left mid lobe thyroid nodule measuring 29 x 22 x 26 mm which is mostly solid and contains coarse calcifications. EXAM: ULTRASOUND GUIDED NEEDLE ASPIRATE BIOPSY OF THE THYROID GLAND MEDICATIONS: 1% Lidocaine. ANESTHESIA/SEDATION: No sedation medication given. COMPARISON:  Ultrasound done 10/01/2015. COMPLICATIONS: None immediate. PROCEDURE: Thyroid biopsy was thoroughly discussed with the patient and  questions were answered. The benefits, risks, alternatives, and complications were also discussed. The patient understands and wishes to proceed with the procedure. Written consent was obtained. Ultrasound was performed to localize and mark an adequate site for the biopsy. The patient was then prepped and draped in a normal sterile fashion. Local anesthesia was provided with 1% lidocaine. Using direct ultrasound guidance, 3 passes were made using needles into the nodules within the right inferior pole of the thyroid and within the left mid lobe of the thyroid. Ultrasound was used to confirm needle placements on all occasions. Specimens were sent to Pathology for analysis. IMPRESSION: Ultrasound guided needle aspirate biopsy performed of the right and left thyroid nodules. Read by:  Gareth Eagle, PA-C Electronically Signed   By: Jacqulynn Cadet M.D.   On: 10/07/2015 11:22   US Thyroid Biopsy  10/07/2015  INDICATION: Multinodular goiter with history of previous thyroidectomy and radioactive iodine. Request for ultrasound guided fine needle aspirate biopsy of the right inferior pole thyroid nodule which measures 20 x 17 x 29 mm and contains coarse calcifications and the left mid lobe thyroid nodule measuring 29 x 22 x 26 mm  which is mostly solid and contains coarse calcifications. EXAM: ULTRASOUND GUIDED NEEDLE ASPIRATE BIOPSY OF THE THYROID GLAND MEDICATIONS: 1% Lidocaine. ANESTHESIA/SEDATION: No sedation medication given. COMPARISON:  Ultrasound done 10/01/2015. COMPLICATIONS: None immediate. PROCEDURE: Thyroid biopsy was thoroughly discussed with the patient and questions were answered. The benefits, risks, alternatives, and complications were also discussed. The patient understands and wishes to proceed with the procedure. Written consent was obtained. Ultrasound was performed to localize and mark an adequate site for the biopsy. The patient was then prepped and draped in a normal sterile fashion. Local anesthesia was provided with 1% lidocaine. Using direct ultrasound guidance, 3 passes were made using needles into the nodules within the right inferior pole of the thyroid and within the left mid lobe of the thyroid. Ultrasound was used to confirm needle placements on all occasions. Specimens were sent to Pathology for analysis. IMPRESSION: Ultrasound guided needle aspirate biopsy performed of the right and left thyroid nodules. Read by:  Gareth Eagle, PA-C Electronically Signed   By: Jacqulynn Cadet M.D.   On: 10/07/2015 11:22   US Thyroid  10/01/2015  CLINICAL DATA:  Nodule EXAM: THYROID ULTRASOUND TECHNIQUE: Ultrasound examination of the thyroid gland and adjacent soft tissues was performed. COMPARISON:  None. FINDINGS: Right thyroid lobe Measurements: 52 x 20 x 23 mm. Inhomogeneous background parenchyma. 15 x 12 x 14 mm solid nodule, superior pole with peripheral coarse calcification. 20 x 14 x 23 mm solid nodule, mid lobe. 20 x 17 x 29 mm solid nodule with coarse calcifications, inferior pole. Left thyroid lobe Measurements: 53 x 29 x 20 mm. Dominant 29 x 22 x 26 mm mostly solid nodule, mid lobe, with coarse calcifications. Adjacent 22 x 20 x 20 mm solid nodule, inferior pole. Isthmus Thickness: 5 mm.  No nodules  visualized. Lymphadenopathy None visualized. IMPRESSION: 1. Thyromegaly with bilateral nodules which meet consensus criteria for biopsy. Ultrasound-guided fine needle aspiration should be considered, as per the consensus statement: Management of Thyroid Nodules Detected at [place]: Society of Radiologists in [place]. Radiology 2005; [id number]. Electronically Signed   By: Lucrezia Europe M.D.   On: 10/01/2015 16:06    Microbiology: Recent Results (from the past 240 hour(s))  Blood culture (routine x 2)     Status: None (Preliminary result)   Collection Time: 10/27/15  8:39  PM  Result Value Ref Range Status   Specimen Description BLOOD RIGHT ANTECUBITAL  Final   Special Requests BOTTLES DRAWN AEROBIC AND ANAEROBIC 5CC  Final   Culture NO GROWTH 2 DAYS  Final   Report Status PENDING  Incomplete  Blood culture (routine x 2)     Status: None (Preliminary result)   Collection Time: 10/27/15  8:44 PM  Result Value Ref Range Status   Specimen Description BLOOD RIGHT HAND  Final   Special Requests BOTTLES DRAWN AEROBIC AND ANAEROBIC 5CC  Final   Culture NO GROWTH 2 DAYS  Final   Report Status PENDING  Incomplete  Surgical pcr screen     Status: None   Collection Time: 10/28/15  5:03 PM  Result Value Ref Range Status   MRSA, PCR NEGATIVE NEGATIVE Final   Staphylococcus aureus NEGATIVE NEGATIVE Final    Comment:        The Xpert SA Assay (FDA approved for NASAL specimens in patients over 68 years of age), is one component of a comprehensive surveillance program.  Test performance has been validated by [facility] for patients greater than or equal to 24 year old. It is not intended to diagnose infection nor to guide or monitor treatment.      Labs: Basic Metabolic Panel:  Recent Labs Lab 10/27/15 1552 10/28/15 0512  NA 137 137  K 4.3 3.8  CL 100* 103  CO2 25 23  GLUCOSE 277* 280*  BUN 13 10  CREATININE 0.60 0.53  CALCIUM 9.3 8.4*   Liver Function  Tests:  Recent Labs Lab 10/28/15 0512  AST 15  ALT 12*  ALKPHOS 58  BILITOT 0.5  PROT 6.1*  ALBUMIN 2.9*   No results for input(s): LIPASE, AMYLASE in the last 168 hours. No results for input(s): AMMONIA in the last 168 hours. CBC:  Recent Labs Lab 10/27/15 1552  WBC 9.8  HGB 12.8  HCT 38.7  MCV 87.8  PLT 265   Cardiac Enzymes: No results for input(s): CKTOTAL, CKMB, CKMBINDEX, TROPONINI in the last 168 hours. BNP: BNP (last 3 results) No results for input(s): BNP in the last 8760 hours.  ProBNP (last 3 results) No results for input(s): PROBNP in the last 8760 hours.  CBG:  Recent Labs Lab 10/29/15 1232 10/29/15 1738 10/29/15 2220 10/30/15 0820 10/30/15 1327  GLUCAP 199* 290* 261* 267* 203*     Signed:  Velvet Bathe MD.  [facility] 10/30/2015, 2:18 PM

## 2015-11-01 LAB — CULTURE, BLOOD (ROUTINE X 2)
Culture: NO GROWTH
Culture: NO GROWTH

## 2015-11-02 ENCOUNTER — Encounter (HOSPITAL_COMMUNITY): Payer: Self-pay | Admitting: Orthopedic Surgery

## 2015-11-15 DIAGNOSIS — E1142 Type 2 diabetes mellitus with diabetic polyneuropathy: Secondary | ICD-10-CM | POA: Diagnosis not present

## 2015-11-15 DIAGNOSIS — L97511 Non-pressure chronic ulcer of other part of right foot limited to breakdown of skin: Secondary | ICD-10-CM | POA: Diagnosis not present

## 2015-11-15 DIAGNOSIS — L02612 Cutaneous abscess of left foot: Secondary | ICD-10-CM | POA: Diagnosis not present

## 2015-11-19 ENCOUNTER — Encounter: Payer: Self-pay | Admitting: Surgery

## 2015-11-28 DIAGNOSIS — Z6826 Body mass index (BMI) 26.0-26.9, adult: Secondary | ICD-10-CM | POA: Diagnosis not present

## 2015-11-28 DIAGNOSIS — I7389 Other specified peripheral vascular diseases: Secondary | ICD-10-CM | POA: Diagnosis not present

## 2015-11-28 DIAGNOSIS — E1159 Type 2 diabetes mellitus with other circulatory complications: Secondary | ICD-10-CM | POA: Diagnosis not present

## 2015-11-28 DIAGNOSIS — E11621 Type 2 diabetes mellitus with foot ulcer: Secondary | ICD-10-CM | POA: Diagnosis not present

## 2015-11-28 DIAGNOSIS — L02612 Cutaneous abscess of left foot: Secondary | ICD-10-CM | POA: Diagnosis not present

## 2015-11-29 ENCOUNTER — Encounter: Payer: Self-pay | Admitting: Surgery

## 2015-11-29 ENCOUNTER — Ambulatory Visit (INDEPENDENT_AMBULATORY_CARE_PROVIDER_SITE_OTHER): Payer: PPO | Admitting: Surgery

## 2015-11-29 VITALS — BP 139/79 | HR 92 | Ht 68.0 in | Wt 174.2 lb

## 2015-11-29 DIAGNOSIS — I7025 Atherosclerosis of native arteries of other extremities with ulceration: Secondary | ICD-10-CM | POA: Diagnosis not present

## 2015-11-29 NOTE — Progress Notes (Signed)
[facility] [place]  Patient name: [name] MRN: [id number] DOB: [birth year] Sex: female  REASON FOR CONSULT: PAD Referring Physician:  Dr. Brigitte Pulse  HPI: [name] is a 82 y.o. female, who is is referred today for evaluation of lower extremity atherosclerotic disease.  She has a history of a left foot ulcer.  She was evaluated in June 2016 by Dr. Fletcher Anon with angiography.  This revealed 2 focal areas of stenosis within the superficial femoral artery and single vessel runoff via the peroneal artery.  She was treated conservatively, and her wounds healed.  She recently developed an infection in her left foot again.  She underwent debridement by Dr. Sharol Given on 10/29/2015.  She denies having rest pain in her leg.  She does not have any infections or drainage in the foot.  She does suffer from claudication but this is not lifestyle limiting for her.  This is in the left calf.  The patient has a history of coronary artery disease.  She has a history of stenting in 2014.  She does not have any chest pain currently.  She is medically managed for hypercholesterolemia with a statin.  She is on ACE inhibitor for hypertension.  The patient is also a diabetic.  This is poorly controlled.  Her last A1c was around 9 she is a former smoker   Past Medical History  Diagnosis Date  . Complication of anesthesia   . PONV (postoperative nausea and vomiting)   . Coronary artery disease   . Hypertension   . Hypothyroidism   . Anxiety   . Shortness of breath   . Diabetes mellitus without complication (HCC)     insulin dependent  . GERD (gastroesophageal reflux disease)   . Neuromuscular disorder (HCC)     muscle cramps to lower extremities  . Other primary cardiomyopathies   . Cellulitis 10/2015    LEFT FOOT  . CAD (coronary artery disease)     Family History  Problem Relation Age of Onset  . Heart disease Father   . Diabetes Sister   . Heart disease Son     before age 72     SOCIAL HISTORY: Social History   Social History  . Marital Status: Married    Spouse Name: N/A  . Number of Children: N/A  . Years of Education: N/A   Occupational History  . Not on file.   Social History Main Topics  . Smoking status: Former Smoker    Quit date: 11/28/1960  . Smokeless tobacco: Never Used  . Alcohol Use: 0.0 oz/week    0 Standard drinks or equivalent per week     Comment: rare  . Drug Use: No  . Sexual Activity: Not on file   Other Topics Concern  . Not on file   Social History Narrative    Allergies  Allergen Reactions  . Sulfa Antibiotics Nausea And Vomiting    Severe vomiting.     Current Outpatient Prescriptions  Medication Sig Dispense Refill  . amLODipine (NORVASC) 10 MG tablet     . aspirin 325 MG EC tablet Take 325 mg by mouth at bedtime.     . B Complex-C (SUPER B COMPLEX PO) Take 1 tablet by mouth daily.    . carvedilol (COREG) 25 MG tablet Take 1 tablet (25 mg total) by mouth 2 (two) times daily with a meal. 180 tablet 3  . Cholecalciferol (VITAMIN D PO) Take 1 tablet by mouth daily.    Marland Kitchen  LEVEMIR FLEXTOUCH 100 UNIT/ML Pen     . lisinopril (PRINIVIL,ZESTRIL) 20 MG tablet     . Multiple Vitamin (MULTIVITAMIN) capsule Take 1 capsule by mouth daily.    Marland Kitchen NITROSTAT 0.4 MG SL tablet PLACE 1 TABLET UNDER THE TONGUE EVERY 5 MINUTES AS NEEDED FOR CHEST PAIN 25 tablet 1  . NOVOLOG FLEXPEN 100 UNIT/ML FlexPen Inject 14 Units into the skin 3 (three) times daily with meals.     . Omega-3 Fatty Acids (FISH OIL) 1200 MG CAPS Take 2,400 mg by mouth 2 (two) times daily.    . pantoprazole (PROTONIX) 40 MG tablet TAKE ONE TABLET BY MOUTH ONCE DAILY AT  6  AM 30 tablet 3  . VITAMIN E PO Take 1 tablet by mouth daily. Reported on 11/29/2015    . atorvastatin (LIPITOR) 20 MG tablet Take 1 tablet (20 mg total) by mouth daily. (Patient not taking: Reported on 11/29/2015) 90 tablet 0  . doxycycline (VIBRA-TABS) 100 MG tablet Take 1 tablet (100 mg total) by  mouth every 12 (twelve) hours. (Patient not taking: Reported on 11/29/2015) 10 tablet 0  . HYDROcodone-acetaminophen (NORCO/VICODIN) 5-325 MG tablet Take 1 tablet by mouth every 4 (four) hours as needed for moderate pain. (Patient not taking: Reported on 11/29/2015) 30 tablet 0  . PARoxetine (PAXIL) 20 MG tablet Take 20 mg by mouth daily with breakfast. Reported on 11/29/2015     No current facility-administered medications for this visit.    REVIEW OF SYSTEMS:  [X]  denotes positive finding, [ ]  denotes negative finding Cardiac  Comments:  Chest pain or chest pressure:    Shortness of breath upon exertion:    Short of breath when lying flat:    Irregular heart rhythm:        Vascular    Pain in calf, thigh, or hip brought on by ambulation: x   Pain in feet at night that wakes you up from your sleep:     Blood clot in your veins:    Leg swelling:         Pulmonary    Oxygen at home:    Productive cough:     Wheezing:         Neurologic    Sudden weakness in arms or legs:     Sudden numbness in arms or legs:     Sudden onset of difficulty speaking or slurred speech:    Temporary loss of vision in one eye:     Problems with dizziness:  x       Gastrointestinal    Blood in stool:     Vomited blood:         Genitourinary    Burning when urinating:     Blood in urine:        Psychiatric    Major depression:         Hematologic    Bleeding problems:    Problems with blood clotting too easily:        Skin    Rashes or ulcers:        Constitutional    Fever or chills:      PHYSICAL EXAM: Filed Vitals:   11/29/15 0935  BP: 139/79  Pulse: 92  Height: 5\' 8"  (1.727 m)  Weight: 174 lb 3.2 oz (79.017 kg)  SpO2: 98%    GENERAL: The patient is a well-nourished female, in no acute distress. The vital signs are documented above. CARDIAC: There is a regular rate and  rhythm.  VASCULAR: I could not palpate pedal pulses.  I did listen with a Doppler and she had brisk signals  within the peroneal and dorsalis pedis artery.  The plantar arch was monophasic PULMONARY: There is good air exchange bilaterally without wheezing or rales. ABDOMEN: Soft and non-tender with normal pitched bowel sounds.  MUSCULOSKELETAL: There are no major deformities or cyanosis. NEUROLOGIC: No focal weakness or paresthesias are detected. SKIN: Healing debridement site at the base of the second toe without drainage or erythema PSYCHIATRIC: The patient has a normal affect.  DATA:  I have reviewed the patient's most recent Doppler study which shows 50% stenosis in the proximal right superficial femoral artery as well as the long segment stenosis greater than 80% in the left superficial femoral artery.  MEDICAL ISSUES: Atherosclerotic lower extremity vascular disease: I have reviewed her angiogram performed by Dr. Fletcher Anon in 2016, as well as her most recent Doppler study.  Fortunately, it appears that her wound is healing appropriately.  Therefore since her claudication symptoms are not lifestyle limiting, and the ulcer is healing, I would recommend no intervention at this time.  I have scheduled her for follow-up in 2 months.  She also knows to contact me should the operative incision start to have separation.  I stressed the importance of better blood sugar control as well as continuing management of her chronic medical problems including hypercholesterolemia and hypertension.   [name] [facility] [employer]: [phone]

## 2015-12-09 ENCOUNTER — Other Ambulatory Visit: Payer: Self-pay | Admitting: Interventional Cardiology

## 2015-12-15 DIAGNOSIS — I739 Peripheral vascular disease, unspecified: Secondary | ICD-10-CM | POA: Diagnosis not present

## 2015-12-15 DIAGNOSIS — L03116 Cellulitis of left lower limb: Secondary | ICD-10-CM | POA: Diagnosis not present

## 2015-12-15 DIAGNOSIS — Z6826 Body mass index (BMI) 26.0-26.9, adult: Secondary | ICD-10-CM | POA: Diagnosis not present

## 2015-12-15 DIAGNOSIS — E1159 Type 2 diabetes mellitus with other circulatory complications: Secondary | ICD-10-CM | POA: Diagnosis not present

## 2015-12-22 DIAGNOSIS — E119 Type 2 diabetes mellitus without complications: Secondary | ICD-10-CM | POA: Diagnosis not present

## 2015-12-22 DIAGNOSIS — L237 Allergic contact dermatitis due to plants, except food: Secondary | ICD-10-CM | POA: Diagnosis not present

## 2015-12-29 NOTE — Progress Notes (Signed)
Cardiology Office Note   Date:  12/30/2015   ID:  Matthews, Jeanette [birth year], MRN [id number]  PCP:  Marton Redwood, MD    No chief complaint on file. f/u CAD   Wt Readings from Last 3 Encounters:  12/30/15 175 lb 12.8 oz (79.742 kg)  11/29/15 174 lb 3.2 oz (79.017 kg)  10/27/15 174 lb 6.4 oz (79.107 kg)       History of Present Illness: [name] is a 82 y.o. female  who had a circumflex stent placed in Jan 2014. She had a PTCA of the OM. She has a CTO of the RCA with good collaterals. She has not had anginal symptoms since that time. Last year, she noticed an ulcer on the tip of her second digit on the right foot. She saw a podiatrist. The ulcer was treated but did not improve. He recommended amputation of the toe per her report. She declined and has been pursuing wound care. She is also wearing a boot on her foot.  She did eventually have a lower extremity arterial Doppler. This revealed multilevel disease in the right leg with the most severe disease being in the SFA. She has tibial vessel disease. She still denied any claudication symptoms prior to the ulcer. Her only cramps in her calves occurred randomly at night. There is nothing related to walking. The ulcer was the first symptom of any significant peripheral vascular disease.  She had an angiogram with Dr. Fletcher Anon in June 2016, showing below the knee disease.  She had a foot infection and had surgery on the left foot.  She is following with Dr. Trula Slade.    She has a lot of stress from her husband with Alzheimers. She has not been exercising at all.    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    She has some upper abdominal pain with stress.  She takes NTG and this relieves the pain.  THis happens once every few months.  No sx with walking.      Past Medical History  Diagnosis Date  . Complication of anesthesia   . PONV (postoperative nausea and vomiting)   . Coronary  artery disease   . Hypertension   . Hypothyroidism   . Anxiety   . Shortness of breath   . Diabetes mellitus without complication (HCC)     insulin dependent  . GERD (gastroesophageal reflux disease)   . Neuromuscular disorder (HCC)     muscle cramps to lower extremities  . Other primary cardiomyopathies   . Cellulitis 10/2015    LEFT FOOT  . CAD (coronary artery disease)     Past Surgical History  Procedure Laterality Date  . Tonsillectomy    . Abdominal hysterectomy    . Cholecystectomy    . Thyroid surgery      radioactive iodine   . Coronary angioplasty with stent placement  08/09/2011    DES  to mid circumflex  . Left heart catheterization with coronary angiogram N/A 08/08/2012    Procedure: LEFT HEART CATHETERIZATION WITH CORONARY ANGIOGRAM;  Surgeon: Jeanette Booze, MD;  Location: [facility] CATH [facility];  Service: Cardiovascular;  Laterality: N/A;  . Peripheral vascular catheterization N/A 12/30/2014    Procedure: Lower Extremity Angiography;  Surgeon: Jeanette Hampshire, MD;  Location: [facility] CV LAB;  Service: Cardiovascular;  Laterality: N/A;  . I&d extremity Left 10/29/2015    Procedure: Irrigation and Debridement Left  Foot;  Surgeon: Newt Minion, MD;  Location: [facility];  Service: Orthopedics;  Laterality: Left;     Current Outpatient Prescriptions  Medication Sig Dispense Refill  . amLODipine (NORVASC) 10 MG tablet Take 10 mg by mouth daily.     Marland Kitchen aspirin 325 MG EC tablet Take 325 mg by mouth at bedtime.     Marland Kitchen atorvastatin (LIPITOR) 20 MG tablet Take 1 tablet (20 mg total) by mouth daily. 90 tablet 0  . B Complex-C (SUPER B COMPLEX PO) Take 1 tablet by mouth daily.    . carvedilol (COREG) 25 MG tablet Take 1 tablet (25 mg total) by mouth 2 (two) times daily with a meal. 180 tablet 3  . Cholecalciferol (VITAMIN D PO) Take 1 tablet by mouth daily.    Marland Kitchen LEVEMIR FLEXTOUCH 100 UNIT/ML Pen Inject 50 Units into the skin daily at 10 pm.     . lisinopril (PRINIVIL,ZESTRIL) 20 MG  tablet Take 20 mg by mouth daily.     . Multiple Vitamin (MULTIVITAMIN) capsule Take 1 capsule by mouth daily.    . nitroGLYCERIN (NITROSTAT) 0.4 MG SL tablet Place 0.4 mg under the tongue every 5 (five) minutes as needed for chest pain (x 3 doses for chest pains).    . NOVOLOG FLEXPEN 100 UNIT/ML FlexPen Inject 14 Units into the skin 3 (three) times daily with meals.     . Omega-3 Fatty Acids (FISH OIL) 1200 MG CAPS Take 2,400 mg by mouth 2 (two) times daily.    . pantoprazole (PROTONIX) 40 MG tablet TAKE ONE TABLET BY MOUTH ONCE DAILY AT  6  AM 30 tablet 3  . PARoxetine (PAXIL) 20 MG tablet Take 20 mg by mouth daily with breakfast. Reported on 11/29/2015    . VITAMIN E PO Take 1 tablet by mouth daily. Reported on 11/29/2015     No current facility-administered medications for this visit.    Allergies:   Sulfa antibiotics    Social History:  The patient  reports that she quit smoking about 55 years ago. She has never used smokeless tobacco. She reports that she drinks alcohol. She reports that she does not use illicit drugs.   Family History:  The patient's family history includes Diabetes in her sister; Heart attack in her father and grandchild; Heart disease in her father and son; Sudden death in her grandchild. There is no history of Hypertension.    ROS:  Please see the history of present illness.   Otherwise, review of systems are positive for foot problems.   All other systems are reviewed and negative.    PHYSICAL EXAM: VS:  BP 140/80 mmHg  Pulse 92  Ht 5\' 8"  (1.727 m)  Wt 175 lb 12.8 oz (79.742 kg)  BMI 26.74 kg/m2 , BMI Body mass index is 26.74 kg/(m^2). GEN: Well nourished, well developed, in no acute distress HEENT: normal Neck: no JVD, carotid bruits, or masses Cardiac: RRR; 2/6 systolic murmur, rubs, or gallops,no edema  Respiratory:  clear to auscultation bilaterally, normal work of breathing GI: soft, nontender, nondistended, + BS MS: no deformity or atrophy Skin:  warm and dry, no rash, bruising on right hip Neuro:  Strength and sensation are intact Psych: euthymic mood, full affect   EKG:   The ekg ordered today demonstrates normal sinus rhythm, QRS widening, and nonspecific ST segment changes which are unchanged.   Recent Labs: 10/27/2015: Hemoglobin 12.8; Platelets 265 10/28/2015: ALT 12*; BUN 10; Creatinine, Ser 0.53; Potassium 3.8; Sodium  137   Lipid Panel    Component Value Date/Time   CHOL 141 12/21/2014 0733   TRIG * 12/21/2014 0733    442.0 Triglyceride is over 400; calculations on Lipids are invalid.   HDL 29.10* 12/21/2014 0733   CHOLHDL 5 12/21/2014 0733   VLDL 56.6* 06/22/2014 0737   LDLCALC 24 10/20/2013 0739   LDLDIRECT 46.0 12/21/2014 0733     Other studies Reviewed: Additional studies/ records that were reviewed today with results demonstrating: Prior echo reviewed.   ASSESSMENT AND PLAN:  1. PAD: Being managed conservatively at this point. No plans for revascularization. She still has a wound that has not fully healed. 2. CAD: Rare angina. She uses nitroglycerin with success. Stress brings on her chest discomfort. It is not brought on with activity. If her symptoms become more frequent, could use isosorbide. She has diffuse calcific coronary artery disease based on her prior angiogram. We'll try to manage medically as long as possible, especially given her age and her other comorbidities. 3. Hyperlipidemia: To be checked with Dr. Brigitte Pulse in the near future. Triglycerides have been elevated in the past. Continue diabetes management.  Continue atorvastatin. 4. HTN: Well controlled. 5. Moderate mitral regurgitation: Noted in 2014 along with mildly decreased ejection fraction. No signs of congestive heart failure at this time.   Current medicines are reviewed at length with the patient today.  The patient concerns regarding her medicines were addressed.  The following changes have been made:  No change  Labs/ tests ordered  today include:   Orders Placed This Encounter  Procedures  . EKG 12-Lead    Recommend 150 minutes/week of aerobic exercise Low fat, low carb, high fiber diet recommended  Disposition:   FU in One year   Signed, Larae Grooms, MD  12/30/2015 9:17 AM    [facility] [address], [place], [place]  [address] Phone: [phone]; Fax: [phone]

## 2015-12-30 ENCOUNTER — Encounter: Payer: Self-pay | Admitting: Interventional Cardiology

## 2015-12-30 ENCOUNTER — Ambulatory Visit (INDEPENDENT_AMBULATORY_CARE_PROVIDER_SITE_OTHER): Payer: PPO | Admitting: Interventional Cardiology

## 2015-12-30 VITALS — BP 140/80 | HR 92 | Ht 68.0 in | Wt 175.8 lb

## 2015-12-30 DIAGNOSIS — I10 Essential (primary) hypertension: Secondary | ICD-10-CM

## 2015-12-30 DIAGNOSIS — E785 Hyperlipidemia, unspecified: Secondary | ICD-10-CM

## 2015-12-30 DIAGNOSIS — I739 Peripheral vascular disease, unspecified: Secondary | ICD-10-CM

## 2015-12-30 DIAGNOSIS — I25119 Atherosclerotic heart disease of native coronary artery with unspecified angina pectoris: Secondary | ICD-10-CM

## 2015-12-30 NOTE — Patient Instructions (Signed)
**Note De-identified Lunell Robart Obfuscation** Medication Instructions:  Same-no changes  Labwork: None  Testing/Procedures: None  Follow-Up: Your physician wants you to follow-up in: 1 year. You will receive a reminder letter in the mail two months in advance. If you don't receive a letter, please call our office to schedule the follow-up appointment.      If you need a refill on your cardiac medications before your next appointment, please call your pharmacy.   

## 2016-01-13 DIAGNOSIS — E1159 Type 2 diabetes mellitus with other circulatory complications: Secondary | ICD-10-CM | POA: Diagnosis not present

## 2016-01-13 DIAGNOSIS — I5032 Chronic diastolic (congestive) heart failure: Secondary | ICD-10-CM | POA: Diagnosis not present

## 2016-01-13 DIAGNOSIS — Z6826 Body mass index (BMI) 26.0-26.9, adult: Secondary | ICD-10-CM | POA: Diagnosis not present

## 2016-01-13 DIAGNOSIS — G609 Hereditary and idiopathic neuropathy, unspecified: Secondary | ICD-10-CM | POA: Diagnosis not present

## 2016-01-13 DIAGNOSIS — E1140 Type 2 diabetes mellitus with diabetic neuropathy, unspecified: Secondary | ICD-10-CM | POA: Diagnosis not present

## 2016-01-13 DIAGNOSIS — I10 Essential (primary) hypertension: Secondary | ICD-10-CM | POA: Diagnosis not present

## 2016-01-13 DIAGNOSIS — N182 Chronic kidney disease, stage 2 (mild): Secondary | ICD-10-CM | POA: Diagnosis not present

## 2016-01-13 DIAGNOSIS — E1129 Type 2 diabetes mellitus with other diabetic kidney complication: Secondary | ICD-10-CM | POA: Diagnosis not present

## 2016-01-13 DIAGNOSIS — E11621 Type 2 diabetes mellitus with foot ulcer: Secondary | ICD-10-CM | POA: Diagnosis not present

## 2016-01-13 DIAGNOSIS — E784 Other hyperlipidemia: Secondary | ICD-10-CM | POA: Diagnosis not present

## 2016-01-13 DIAGNOSIS — I739 Peripheral vascular disease, unspecified: Secondary | ICD-10-CM | POA: Diagnosis not present

## 2016-01-13 DIAGNOSIS — I429 Cardiomyopathy, unspecified: Secondary | ICD-10-CM | POA: Diagnosis not present

## 2016-01-19 ENCOUNTER — Ambulatory Visit: Payer: PPO | Admitting: Podiatry

## 2016-02-03 ENCOUNTER — Encounter: Payer: Self-pay | Admitting: Surgery

## 2016-02-14 ENCOUNTER — Ambulatory Visit (INDEPENDENT_AMBULATORY_CARE_PROVIDER_SITE_OTHER): Payer: PPO | Admitting: Surgery

## 2016-02-14 ENCOUNTER — Encounter: Payer: Self-pay | Admitting: Surgery

## 2016-02-14 VITALS — BP 129/68 | HR 77 | Resp 20 | Ht 68.0 in | Wt 178.0 lb

## 2016-02-14 DIAGNOSIS — E1142 Type 2 diabetes mellitus with diabetic polyneuropathy: Secondary | ICD-10-CM | POA: Diagnosis not present

## 2016-02-14 DIAGNOSIS — L02612 Cutaneous abscess of left foot: Secondary | ICD-10-CM | POA: Diagnosis not present

## 2016-02-14 DIAGNOSIS — L97511 Non-pressure chronic ulcer of other part of right foot limited to breakdown of skin: Secondary | ICD-10-CM | POA: Diagnosis not present

## 2016-02-14 DIAGNOSIS — I7025 Atherosclerosis of native arteries of other extremities with ulceration: Secondary | ICD-10-CM

## 2016-02-14 NOTE — Progress Notes (Signed)
[facility] [place]  Patient name: [name] MRN: [id number] DOB: 01-05-1932 Sex: female  REASON FOR VISIT: follow up  HPI: [name] is a 82 y.o. female, who is is referred today for evaluation of lower extremity atherosclerotic disease.  She has a history of a left foot ulcer.  She was evaluated in June 2016 by Dr. Fletcher Anon with angiography.  This revealed 2 focal areas of stenosis within the superficial femoral artery and single vessel runoff via the peroneal artery.  She was treated conservatively, and her wounds healed.  She recently developed an infection in her left foot again.  She underwent debridement by Dr. Sharol Given on 10/29/2015.  She denies having rest pain in her leg.  She does not have any infections or drainage in the foot.  She does suffer from claudication but this is not lifestyle limiting for her.  This is in the left calf.  She continues to have follow-up with Dr. Sharol Given who is performing debridements.  She is also in compression.  She denies fevers or chills  Past Medical History:  Diagnosis Date  . Anxiety   . CAD (coronary artery disease)   . Cellulitis 10/2015   LEFT FOOT  . Complication of anesthesia   . Coronary artery disease   . Diabetes mellitus without complication (HCC)    insulin dependent  . GERD (gastroesophageal reflux disease)   . Hypertension   . Hypothyroidism   . Neuromuscular disorder (HCC)    muscle cramps to lower extremities  . Other primary cardiomyopathies   . PONV (postoperative nausea and vomiting)   . Shortness of breath     Family History  Problem Relation Age of Onset  . Heart disease Father   . Diabetes Sister   . Heart disease Son     before age 51  . Heart attack Father   . Hypertension Neg Hx   . Heart attack 80     82yrold  . Sudden death Grandchild     SOCIAL HISTORY: Social History  Substance Use Topics  . Smoking status: Former Smoker    Quit date:  11/28/1960  . Smokeless tobacco: Never Used  . Alcohol use 0.0 oz/week     Comment: rare    Allergies  Allergen Reactions  . Sulfa Antibiotics Nausea And Vomiting    Severe vomiting.     Current Outpatient Prescriptions  Medication Sig Dispense Refill  . amLODipine (NORVASC) 10 MG tablet Take 10 mg by mouth daily.     .Marland Kitchenaspirin 325 MG EC tablet Take 325 mg by mouth at bedtime.     .Marland Kitchenatorvastatin (LIPITOR) 20 MG tablet Take 1 tablet (20 mg total) by mouth daily. 90 tablet 0  . B Complex-C (SUPER B COMPLEX PO) Take 1 tablet by mouth daily.    . carvedilol (COREG) 25 MG tablet Take 1 tablet (25 mg total) by mouth 2 (two) times daily with a meal. 180 tablet 3  . Cholecalciferol (VITAMIN D PO) Take 1 tablet by mouth daily.    .Marland KitchenLEVEMIR FLEXTOUCH 100 UNIT/ML Pen Inject 50 Units into the skin daily at 10 pm.     . lisinopril (PRINIVIL,ZESTRIL) 20 MG tablet Take 20 mg by mouth daily.     . Multiple Vitamin (MULTIVITAMIN) capsule Take 1 capsule by mouth daily.    . nitroGLYCERIN (NITROSTAT) 0.4 MG SL tablet Place 0.4 mg under the tongue every 5 (five) minutes as needed for chest pain (  x 3 doses for chest pains).    . NOVOLOG FLEXPEN 100 UNIT/ML FlexPen Inject 14 Units into the skin 3 (three) times daily with meals.     . Omega-3 Fatty Acids (FISH OIL) 1200 MG CAPS Take 2,400 mg by mouth 2 (two) times daily.    . pantoprazole (PROTONIX) 40 MG tablet TAKE ONE TABLET BY MOUTH ONCE DAILY AT  6  AM 30 tablet 3  . PARoxetine (PAXIL) 20 MG tablet Take 20 mg by mouth daily with breakfast. Reported on 11/29/2015    . VITAMIN E PO Take 1 tablet by mouth daily. Reported on 11/29/2015     No current facility-administered medications for this visit.     REVIEW OF SYSTEMS:  '[X]'$  denotes positive finding, '[ ]'$  denotes negative finding Cardiac  Comments:  Chest pain or chest pressure:    Shortness of breath upon exertion:    Short of breath when lying flat:    Irregular heart rhythm:        Vascular      Pain in calf, thigh, or hip brought on by ambulation:    Pain in feet at night that wakes you up from your sleep:     Blood clot in your veins:    Leg swelling:         Pulmonary    Oxygen at home:    Productive cough:     Wheezing:         Neurologic    Sudden weakness in arms or legs:     Sudden numbness in arms or legs:     Sudden onset of difficulty speaking or slurred speech:    Temporary loss of vision in one eye:     Problems with dizziness:         Gastrointestinal    Blood in stool:     Vomited blood:         Genitourinary    Burning when urinating:     Blood in urine:        Psychiatric    Major depression:         Hematologic    Bleeding problems:    Problems with blood clotting too easily:        Skin    Rashes or ulcers:        Constitutional    Fever or chills:      PHYSICAL EXAM: Vitals:   02/14/16 0932  BP: 129/68  Pulse: 77  Resp: 20  TempSrc: Oral  SpO2: 99%  Weight: 178 lb (80.7 kg)  Height: '5\' 8"'$  (1.727 m)    GENERAL: The patient is a well-nourished female, in no acute distress. The vital signs are documented above. CARDIAC: There is a regular rate and rhythm.  VASCULAR: non-palpable pedal pulses PULMONARY: There is good air exchange bilaterally without wheezing or rales. MUSCULOSKELETAL: There are no major deformities or cyanosis. NEUROLOGIC: No focal weakness or paresthesias are detected. SKIN: There are no ulcers or rashes noted. PSYCHIATRIC: The patient has a normal affect.  DATA:  none  MEDICAL ISSUES: Left diabetic foot ulcer: The patient's wound has been very slow to heal but is making progress.  There is no evidence of infection.  She does not wish to pursue revascularization at this time.  I feel that is reasonable because her wound is making progress, albeit very slow.  She will continue to get further wound care with Dr. Sharol Given.  I have her scheduled for follow-up with me  in 6 weeks.  She knows that if the wound  deteriorates to contact me so we can consider revascularization.    [name], MD [facility] [phone] Pager [phone]

## 2016-02-17 ENCOUNTER — Other Ambulatory Visit: Payer: Self-pay | Admitting: Interventional Cardiology

## 2016-03-13 DIAGNOSIS — E1142 Type 2 diabetes mellitus with diabetic polyneuropathy: Secondary | ICD-10-CM | POA: Diagnosis not present

## 2016-03-13 DIAGNOSIS — L97511 Non-pressure chronic ulcer of other part of right foot limited to breakdown of skin: Secondary | ICD-10-CM | POA: Diagnosis not present

## 2016-03-13 DIAGNOSIS — L02612 Cutaneous abscess of left foot: Secondary | ICD-10-CM | POA: Diagnosis not present

## 2016-03-27 ENCOUNTER — Ambulatory Visit: Payer: PPO | Admitting: Surgery

## 2016-03-28 DIAGNOSIS — L281 Prurigo nodularis: Secondary | ICD-10-CM | POA: Diagnosis not present

## 2016-03-28 DIAGNOSIS — D485 Neoplasm of uncertain behavior of skin: Secondary | ICD-10-CM | POA: Diagnosis not present

## 2016-04-10 ENCOUNTER — Other Ambulatory Visit: Payer: Self-pay | Admitting: Internal Medicine

## 2016-04-10 DIAGNOSIS — E041 Nontoxic single thyroid nodule: Secondary | ICD-10-CM

## 2016-04-18 ENCOUNTER — Ambulatory Visit
Admission: RE | Admit: 2016-04-18 | Discharge: 2016-04-18 | Disposition: A | Discharge status: Home / Self Care | Payer: PPO | Source: Ambulatory Visit | Attending: Internal Medicine | Admitting: Internal Medicine

## 2016-04-18 DIAGNOSIS — E041 Nontoxic single thyroid nodule: Secondary | ICD-10-CM

## 2016-04-18 DIAGNOSIS — E042 Nontoxic multinodular goiter: Secondary | ICD-10-CM | POA: Diagnosis not present

## 2016-04-21 ENCOUNTER — Encounter: Payer: Self-pay | Admitting: Interventional Cardiology

## 2016-04-21 DIAGNOSIS — M859 Disorder of bone density and structure, unspecified: Secondary | ICD-10-CM | POA: Diagnosis not present

## 2016-04-21 DIAGNOSIS — E1159 Type 2 diabetes mellitus with other circulatory complications: Secondary | ICD-10-CM | POA: Diagnosis not present

## 2016-04-21 DIAGNOSIS — R8299 Other abnormal findings in urine: Secondary | ICD-10-CM | POA: Diagnosis not present

## 2016-04-21 DIAGNOSIS — E784 Other hyperlipidemia: Secondary | ICD-10-CM | POA: Diagnosis not present

## 2016-04-21 DIAGNOSIS — N390 Urinary tract infection, site not specified: Secondary | ICD-10-CM | POA: Diagnosis not present

## 2016-04-21 DIAGNOSIS — I10 Essential (primary) hypertension: Secondary | ICD-10-CM | POA: Diagnosis not present

## 2016-04-27 ENCOUNTER — Ambulatory Visit (INDEPENDENT_AMBULATORY_CARE_PROVIDER_SITE_OTHER): Payer: PPO | Admitting: Orthopedic Surgery

## 2016-04-27 DIAGNOSIS — L02612 Cutaneous abscess of left foot: Secondary | ICD-10-CM

## 2016-04-27 DIAGNOSIS — M6702 Short Achilles tendon (acquired), left ankle: Secondary | ICD-10-CM

## 2016-04-27 DIAGNOSIS — L97511 Non-pressure chronic ulcer of other part of right foot limited to breakdown of skin: Secondary | ICD-10-CM | POA: Diagnosis not present

## 2016-04-27 DIAGNOSIS — E1142 Type 2 diabetes mellitus with diabetic polyneuropathy: Secondary | ICD-10-CM | POA: Diagnosis not present

## 2016-04-28 DIAGNOSIS — I10 Essential (primary) hypertension: Secondary | ICD-10-CM | POA: Diagnosis not present

## 2016-04-28 DIAGNOSIS — E041 Nontoxic single thyroid nodule: Secondary | ICD-10-CM | POA: Diagnosis not present

## 2016-04-28 DIAGNOSIS — G609 Hereditary and idiopathic neuropathy, unspecified: Secondary | ICD-10-CM | POA: Diagnosis not present

## 2016-04-28 DIAGNOSIS — E781 Pure hyperglyceridemia: Secondary | ICD-10-CM | POA: Diagnosis not present

## 2016-04-28 DIAGNOSIS — I7389 Other specified peripheral vascular diseases: Secondary | ICD-10-CM | POA: Diagnosis not present

## 2016-05-08 DIAGNOSIS — H2511 Age-related nuclear cataract, right eye: Secondary | ICD-10-CM | POA: Diagnosis not present

## 2016-05-08 DIAGNOSIS — H2513 Age-related nuclear cataract, bilateral: Secondary | ICD-10-CM | POA: Diagnosis not present

## 2016-05-08 DIAGNOSIS — H25013 Cortical age-related cataract, bilateral: Secondary | ICD-10-CM | POA: Diagnosis not present

## 2016-05-08 DIAGNOSIS — Z794 Long term (current) use of insulin: Secondary | ICD-10-CM | POA: Diagnosis not present

## 2016-05-08 DIAGNOSIS — H25011 Cortical age-related cataract, right eye: Secondary | ICD-10-CM | POA: Diagnosis not present

## 2016-05-08 DIAGNOSIS — E119 Type 2 diabetes mellitus without complications: Secondary | ICD-10-CM | POA: Diagnosis not present

## 2016-05-10 DIAGNOSIS — L281 Prurigo nodularis: Secondary | ICD-10-CM | POA: Diagnosis not present

## 2016-05-17 DIAGNOSIS — H25011 Cortical age-related cataract, right eye: Secondary | ICD-10-CM | POA: Diagnosis not present

## 2016-05-17 DIAGNOSIS — H2511 Age-related nuclear cataract, right eye: Secondary | ICD-10-CM | POA: Diagnosis not present

## 2016-05-17 DIAGNOSIS — H2512 Age-related nuclear cataract, left eye: Secondary | ICD-10-CM | POA: Diagnosis not present

## 2016-05-24 DIAGNOSIS — H25012 Cortical age-related cataract, left eye: Secondary | ICD-10-CM | POA: Diagnosis not present

## 2016-05-24 DIAGNOSIS — H2512 Age-related nuclear cataract, left eye: Secondary | ICD-10-CM | POA: Diagnosis not present

## 2016-05-25 ENCOUNTER — Ambulatory Visit (INDEPENDENT_AMBULATORY_CARE_PROVIDER_SITE_OTHER): Payer: PPO | Admitting: Orthopedic Surgery

## 2016-06-01 ENCOUNTER — Ambulatory Visit (INDEPENDENT_AMBULATORY_CARE_PROVIDER_SITE_OTHER): Payer: PPO | Admitting: Orthopedic Surgery

## 2016-06-01 ENCOUNTER — Encounter (INDEPENDENT_AMBULATORY_CARE_PROVIDER_SITE_OTHER): Payer: Self-pay | Admitting: Orthopedic Surgery

## 2016-06-01 VITALS — Ht 68.0 in | Wt 178.0 lb

## 2016-06-01 DIAGNOSIS — L97521 Non-pressure chronic ulcer of other part of left foot limited to breakdown of skin: Secondary | ICD-10-CM

## 2016-06-01 NOTE — Progress Notes (Signed)
Office Visit Note   Patient: [name]           Date of Birth: December 25, 1931           MRN: [id number] Visit Date: 06/01/2016              Requested by: Marton Redwood, MD [address] [place], [place] [address] PCP: Marton Redwood, MD   Assessment & Plan: Visit Diagnoses: No diagnosis found.  Plan: Recommended she use felt pressure relieving donuts over the ulcer. States will try again. Has some felt she purchased at [facility] compression stockings daily. Continue silver ointment to ulcer daily following wound care.   Follow-Up Instructions: No Follow-up on file.   Orders:  No orders of the defined types were placed in this encounter.  No orders of the defined types were placed in this encounter.     Procedures: No procedures performed   Clinical Data: No additional findings.   Subjective: Chief Complaint  Patient presents with  . Left Foot - Follow-up    Left foot incision and drainage 10/29/15    Patient is 7 months past incision and drainage left foot. She is wearing a Vive compression sock and applying " silver gel" that she has purchased to the wound. There is a thick yellow callus under the third MTH with a small open area that does not appear to be draining. States that it has been getting better " slowly but surely"   has not been compliant with felt pressure relieving donuts. Feels it was hard for her to line these up with the ulcer in her shoe wear.   Review of Systems  Constitutional: Negative for chills and fever.  Skin: Positive for wound.  All other systems reviewed and are negative.    Objective: Vital Signs: Ht 5\' 8"  (1.727 m)   Wt 178 lb (80.7 kg)   BMI 27.06 kg/m   Physical Exam  Constitutional: She is oriented to person, place, and time. She appears well-developed and well-nourished.  Pulmonary/Chest: Effort normal.  Neurological: She is alert and oriented to person, place, and time.  Psychiatric: She has a normal  mood and affect.  Nursing note reviewed. 2 cm in diameter callused ulceration over 3rd metatarsal head. This was pared with a 10 blade knife following informed consent. There is central ulceration measuring 5 mm x 3 mm probes 3 mm deep. Does not probe to bone. Pink tissue in wound bed. No drainage. No surrounding erythema or sign of infection. No odor.   Also has thickened and discolored onychomycotic nails x 5 left foot. Patient is unable to safely trim own nails. These were trimmed today x 5.   Ortho Exam  Specialty Comments:  No specialty comments available.  Imaging: No results found.   PMFS History: Patient Active Problem List   Diagnosis Date Noted  . Cellulitis 10/27/2015  . Peripheral arterial disease ([place]) 10/20/2015  . Severe peripheral arterial disease ([place]) 12/28/2014  . Chronic diastolic heart failure ([place]) 12/30/2013  . Edema 12/30/2013  . Mixed hyperlipidemia 06/24/2013  . Mitral valve disorders(424.0) 06/24/2013  . Coronary artery disease   . Hypertension   . Diabetes mellitus without complication ([place])   . Other primary cardiomyopathies    Past Medical History:  Diagnosis Date  . Anxiety   . CAD (coronary artery disease)   . Cellulitis 10/2015   LEFT FOOT  . Complication of anesthesia   . Coronary artery disease   . Diabetes  mellitus without complication (HCC)    insulin dependent  . GERD (gastroesophageal reflux disease)   . Hypertension   . Hypothyroidism   . Neuromuscular disorder (HCC)    muscle cramps to lower extremities  . Other primary cardiomyopathies   . PONV (postoperative nausea and vomiting)   . Shortness of breath     Family History  Problem Relation Age of Onset  . Heart disease Father   . Diabetes Sister   . Heart disease Son     before age 46  . Heart attack Father   . Hypertension Neg Hx   . Heart attack 84     80yr old  . Sudden death Grandchild     Past Surgical History:  Procedure Laterality Date  . ABDOMINAL  HYSTERECTOMY    . CHOLECYSTECTOMY    . CORONARY ANGIOPLASTY WITH STENT PLACEMENT  08/09/2011   DES  to mid circumflex  . I&D EXTREMITY Left 10/29/2015   Procedure: Irrigation and Debridement Left Foot;  Surgeon: Newt Minion, MD;  Location: [facility];  Service: Orthopedics;  Laterality: Left;  . LEFT HEART CATHETERIZATION WITH CORONARY ANGIOGRAM N/A 08/08/2012   Procedure: LEFT HEART CATHETERIZATION WITH CORONARY ANGIOGRAM;  Surgeon: [name], MD;  Location: [facility] CATH LAB;  Service: Cardiovascular;  Laterality: N/A;  . PERIPHERAL VASCULAR CATHETERIZATION N/A 12/30/2014   Procedure: Lower Extremity Angiography;  Surgeon: Wellington Hampshire, MD;  Location: [facility] CV LAB;  Service: Cardiovascular;  Laterality: N/A;  . THYROID SURGERY     radioactive iodine   . TONSILLECTOMY     Social History   Occupational History  . Not on file.   Social History Main Topics  . Smoking status: Former Smoker    Quit date: 11/28/1960  . Smokeless tobacco: Never Used  . Alcohol use 0.0 oz/week     Comment: rare  . Drug use: No  . Sexual activity: Not on file

## 2016-06-12 DIAGNOSIS — Z6828 Body mass index (BMI) 28.0-28.9, adult: Secondary | ICD-10-CM | POA: Diagnosis not present

## 2016-06-12 DIAGNOSIS — E1129 Type 2 diabetes mellitus with other diabetic kidney complication: Secondary | ICD-10-CM | POA: Diagnosis not present

## 2016-06-12 DIAGNOSIS — E784 Other hyperlipidemia: Secondary | ICD-10-CM | POA: Diagnosis not present

## 2016-06-12 DIAGNOSIS — E1159 Type 2 diabetes mellitus with other circulatory complications: Secondary | ICD-10-CM | POA: Diagnosis not present

## 2016-06-12 DIAGNOSIS — I10 Essential (primary) hypertension: Secondary | ICD-10-CM | POA: Diagnosis not present

## 2016-06-12 DIAGNOSIS — R51 Headache: Secondary | ICD-10-CM | POA: Diagnosis not present

## 2016-06-12 DIAGNOSIS — E781 Pure hyperglyceridemia: Secondary | ICD-10-CM | POA: Diagnosis not present

## 2016-06-12 DIAGNOSIS — E1140 Type 2 diabetes mellitus with diabetic neuropathy, unspecified: Secondary | ICD-10-CM | POA: Diagnosis not present

## 2016-06-12 DIAGNOSIS — Z1389 Encounter for screening for other disorder: Secondary | ICD-10-CM | POA: Diagnosis not present

## 2016-06-14 ENCOUNTER — Other Ambulatory Visit: Payer: Self-pay | Admitting: Internal Medicine

## 2016-06-14 DIAGNOSIS — R519 Headache, unspecified: Secondary | ICD-10-CM

## 2016-06-14 DIAGNOSIS — R51 Headache: Principal | ICD-10-CM

## 2016-06-22 ENCOUNTER — Ambulatory Visit
Admission: RE | Admit: 2016-06-22 | Discharge: 2016-06-22 | Disposition: A | Discharge status: Home / Self Care | Payer: PPO | Source: Ambulatory Visit | Attending: Internal Medicine | Admitting: Internal Medicine

## 2016-06-22 DIAGNOSIS — R519 Headache, unspecified: Secondary | ICD-10-CM

## 2016-06-22 DIAGNOSIS — R51 Headache: Secondary | ICD-10-CM | POA: Diagnosis not present

## 2016-06-22 MED ORDER — GADOBENATE DIMEGLUMINE 529 MG/ML IV SOLN
17.0000 mL | Freq: Once | INTRAVENOUS | Status: AC | PRN
  Administered 2016-06-22: 17 mL via INTRAVENOUS

## 2016-06-26 ENCOUNTER — Ambulatory Visit: Payer: PPO | Admitting: Surgery

## 2016-07-15 ENCOUNTER — Other Ambulatory Visit: Payer: Self-pay | Admitting: Cardiovascular Disease

## 2016-07-18 NOTE — Telephone Encounter (Signed)
Review for refill. 

## 2016-07-24 ENCOUNTER — Ambulatory Visit (INDEPENDENT_AMBULATORY_CARE_PROVIDER_SITE_OTHER): Payer: PPO | Admitting: Orthopedic Surgery

## 2016-07-24 ENCOUNTER — Encounter (INDEPENDENT_AMBULATORY_CARE_PROVIDER_SITE_OTHER): Payer: Self-pay | Admitting: Orthopedic Surgery

## 2016-07-24 VITALS — Ht 68.0 in | Wt 178.0 lb

## 2016-07-24 DIAGNOSIS — M6702 Short Achilles tendon (acquired), left ankle: Secondary | ICD-10-CM | POA: Insufficient documentation

## 2016-07-24 DIAGNOSIS — L97521 Non-pressure chronic ulcer of other part of left foot limited to breakdown of skin: Secondary | ICD-10-CM

## 2016-07-24 DIAGNOSIS — E119 Type 2 diabetes mellitus without complications: Secondary | ICD-10-CM | POA: Diagnosis not present

## 2016-07-24 NOTE — Progress Notes (Signed)
Office Visit Note   Patient: [name]           Date of Birth: 1931-08-14           MRN: [id number] Visit Date: 07/24/2016              Requested by: Marton Redwood, MD [address] [place], [place] [address] PCP: Marton Redwood, MD  Chief Complaint  Patient presents with  . Left Foot - Follow-up    10/29/15(81m) Irrigation and Debridement Left Foot      HPI: Patient presents today for follow up left foot. She feels it is doing worse, she has increased amount of time on her feet due to the holidays. There is slight maceration. She is wearing VIVE compression stocking, and unable to use felt donut, it is frequently moving and could not get it aligned in the right place. She feels this is worsening. Stepheney L Peele, RT  patient denies any fever or chills. Denies any cellulitis.  Assessment & Plan: Visit Diagnoses:  1. Ulcer of left foot, limited to breakdown of skin ([place])   2. Diabetes mellitus without complication ([place])   3. Achilles tendon contracture, left     Plan: patient would like to continue conservative therapy she does have heel cord contracture with a chronic Wagner grade 1 ulcer beneath the second and third metatarsal head. She will go ahead and get a kneeling scooter continue with wound care with Neosporin and a Band-Aid change daily. Follow-up in 4 weeks. Discussed that if she is not showing improvement we would consider a Weil osteotomy for the second third metatarsal with gastrocnemius recession.  Plan for 2 view radiographs of the left foot at follow-up.  Follow-Up Instructions: Return in about 4 weeks (around 08/21/2016).   Ortho Exam Patient is alert oriented no adenopathy well-dressed normal affect normal respiratory effort.  Examination she does have a diminished dorsalis pedis pulse. Patient has been seen by vascular surgery with Dr. Trula Slade and was not recommended any type of interventional intervention. Examination she has a Wagner grade 1 ulcer which  does not probe to bone or tendon. After informed consent a 10 blade knife was used to debride the skin and soft tissue back to healthy viable granulation tissue. The ulcers 15 mm in diameter and 3 mm deep after debridement. A Band-Aid was applied. There is no synovitis no drainage no odor no signs of infection.  Imaging: No results found.  Orders:  No orders of the defined types were placed in this encounter.  No orders of the defined types were placed in this encounter.    Procedures: No procedures performed  Clinical Data: No additional findings.  Subjective: Review of Systems  Objective: Vital Signs: Ht 5\' 8"  (1.727 m)   Wt 178 lb (80.7 kg)   BMI 27.06 kg/m   Specialty Comments:  No specialty comments available.  PMFS History: Patient Active Problem List   Diagnosis Date Noted  . Achilles tendon contracture, left 07/24/2016  . Ulcer of left foot, limited to breakdown of skin ([place]) 06/01/2016  . Cellulitis 10/27/2015  . Peripheral arterial disease ([place]) 10/20/2015  . Severe peripheral arterial disease ([place]) 12/28/2014  . Chronic diastolic heart failure ([place]) 12/30/2013  . Edema 12/30/2013  . Mixed hyperlipidemia 06/24/2013  . Mitral valve disorders(424.0) 06/24/2013  . Coronary artery disease   . Hypertension   . Diabetes mellitus without complication ([place])   . Other primary cardiomyopathies    Past Medical History:  Diagnosis  Date  . Anxiety   . CAD (coronary artery disease)   . Cellulitis 10/2015   LEFT FOOT  . Complication of anesthesia   . Coronary artery disease   . Diabetes mellitus without complication (HCC)    insulin dependent  . GERD (gastroesophageal reflux disease)   . Hypertension   . Hypothyroidism   . Neuromuscular disorder (HCC)    muscle cramps to lower extremities  . Other primary cardiomyopathies   . PONV (postoperative nausea and vomiting)   . Shortness of breath     Family History  Problem Relation Age of Onset  . Heart disease  Father   . Heart attack Father   . Diabetes Sister   . Heart disease Son     before age 62  . Heart attack 3     81yr old  . Sudden death Grandchild   . Hypertension Neg Hx     Past Surgical History:  Procedure Laterality Date  . ABDOMINAL HYSTERECTOMY    . CHOLECYSTECTOMY    . CORONARY ANGIOPLASTY WITH STENT PLACEMENT  08/09/2011   DES  to mid circumflex  . I&D EXTREMITY Left 10/29/2015   Procedure: Irrigation and Debridement Left Foot;  Surgeon: Newt Minion, MD;  Location: [facility];  Service: Orthopedics;  Laterality: Left;  . LEFT HEART CATHETERIZATION WITH CORONARY ANGIOGRAM N/A 08/08/2012   Procedure: LEFT HEART CATHETERIZATION WITH CORONARY ANGIOGRAM;  Surgeon: [name], MD;  Location: [facility] CATH LAB;  Service: Cardiovascular;  Laterality: N/A;  . PERIPHERAL VASCULAR CATHETERIZATION N/A 12/30/2014   Procedure: Lower Extremity Angiography;  Surgeon: Wellington Hampshire, MD;  Location: [facility] CV LAB;  Service: Cardiovascular;  Laterality: N/A;  . THYROID SURGERY     radioactive iodine   . TONSILLECTOMY     Social History   Occupational History  . Not on file.   Social History Main Topics  . Smoking status: Former Smoker    Quit date: 11/28/1960  . Smokeless tobacco: Never Used  . Alcohol use 0.0 oz/week     Comment: rare  . Drug use: No  . Sexual activity: Not on file

## 2016-07-27 ENCOUNTER — Ambulatory Visit (INDEPENDENT_AMBULATORY_CARE_PROVIDER_SITE_OTHER): Payer: PPO | Admitting: Orthopedic Surgery

## 2016-07-30 ENCOUNTER — Other Ambulatory Visit: Payer: Self-pay | Admitting: Interventional Cardiology

## 2016-08-01 NOTE — Telephone Encounter (Signed)
Rx refill sent to pharmacy. 

## 2016-08-04 ENCOUNTER — Ambulatory Visit: Payer: PPO | Admitting: Podiatry

## 2016-08-10 ENCOUNTER — Ambulatory Visit (INDEPENDENT_AMBULATORY_CARE_PROVIDER_SITE_OTHER): Payer: PPO | Admitting: Podiatry

## 2016-08-10 ENCOUNTER — Encounter: Payer: Self-pay | Admitting: Podiatry

## 2016-08-10 VITALS — Ht 68.0 in | Wt 178.0 lb

## 2016-08-10 DIAGNOSIS — M79675 Pain in left toe(s): Secondary | ICD-10-CM | POA: Diagnosis not present

## 2016-08-10 DIAGNOSIS — M79674 Pain in right toe(s): Secondary | ICD-10-CM

## 2016-08-10 DIAGNOSIS — B351 Tinea unguium: Secondary | ICD-10-CM | POA: Diagnosis not present

## 2016-08-10 DIAGNOSIS — L97511 Non-pressure chronic ulcer of other part of right foot limited to breakdown of skin: Secondary | ICD-10-CM | POA: Diagnosis not present

## 2016-08-10 NOTE — Progress Notes (Signed)
Patient presents to the office for preventative foot care services as that her nails have grown thick and long and are painful walking and wearing her shoes also states that she has a ulcer on the bottom of her left foot. She gives a history of having surgery performed by Dr. Sharol Given to her left forefoot. She says the surgery was infected and this patient was seen by Dr. Amalia Hailey who recommended her admit presents to the hospital. She was treated with antibiotics and released now says that this ulcer has been open now for 7 months. 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 She is concerned that the ulcer continues to be open and states that she is interested in receiving a second opinion. She says she has a future appointment with Dr. Sharol Given in February. Patient is diagnosed with severe peripheral arterial disease.  GENERAL APPEARANCE: Alert, conversant. Appropriately groomed. No acute distress.  VASCULAR: Pedal pulses are  palpable at  [facility] and PT bilateral.  Capillary refill time is immediate to all digits,  Normal temperature gradient.  Digital hair growth is present bilateral  NEUROLOGIC: sensation is diminished to 5.07 monofilament at 5/5 sites bilateral.  Light touch is intact bilateral, Muscle strength normal.  MUSCULOSKELETAL: acceptable muscle strength, tone and stability bilateral.  Intrinsic muscluature intact bilateral.  Rectus appearance of foot and digits noted bilateral.   DERMATOLOGIC: skin color, texture, and turgor are within normal limits.  No preulcerative lesions or ulcers  are seen, no interdigital maceration noted.  No open lesions present. . No drainage noted. There is a 4 mm. X 4  Mm. Ulcer on left forefoot. Healthy granulation tissue noted at the ulcer site with ring of hyperkeratotic tissue.  No drainage or redness or infection noted. NAILS  Thick disfigured discolored nails both feet.   Dx.  Onychomycosis  B/L  Ulcer left forefoot.  Tx.  debridement  of nails was performed ulcer on the left fourth foot was to provided of necrotic tissue. Neosporin and dry sterile dressing was applied to the foot, followed by the application of an Haematologist. I discussed this with the patient and we will try to close it with an Unna boot prior to receiving a second opinion return to the office in one week for evaluation and treatment   Gardiner Barefoot DPM

## 2016-08-16 ENCOUNTER — Encounter: Payer: Self-pay | Admitting: Podiatry

## 2016-08-16 ENCOUNTER — Ambulatory Visit (INDEPENDENT_AMBULATORY_CARE_PROVIDER_SITE_OTHER): Payer: PPO | Admitting: Podiatry

## 2016-08-16 VITALS — BP 151/77 | HR 79 | Resp 14

## 2016-08-16 DIAGNOSIS — L97511 Non-pressure chronic ulcer of other part of right foot limited to breakdown of skin: Secondary | ICD-10-CM

## 2016-08-16 NOTE — Progress Notes (Signed)
Patient presents to the office for continued evaluation of an ulcer on her left forefoot. She was treated last week with an Unna boot and told to wear a surgical shoe. She says she wore the Unna boot for approximately 3 days and needed to remove it due to the fact that it became rash, but she says she has been soaking her foot and bandaging her foot every day. She presents the office today stating that she feels is healing nicely. She is very pleased with her progress  GENERAL APPEARANCE: Alert, conversant. Appropriately groomed. No acute distress.  VASCULAR: Pedal pulses are  palpable at  [facility] and PT bilateral.  Capillary refill time is immediate to all digits,  Normal temperature gradient.  Digital hair growth is present bilateral  NEUROLOGIC: sensation is diminished to 5.07 monofilament at 5/5 sites bilateral.  Light touch is intact bilateral, Muscle strength normal.  MUSCULOSKELETAL: acceptable muscle strength, tone and stability bilateral.  Intrinsic muscluature intact bilateral.  Rectus appearance of foot and digits noted bilateral.   DERMATOLOGIC: skin color, texture, and turgor are within normal limits.  No drainage noted. There is a 1 mm. X 2 mm.  Ulcer on left forefoot. Healthy granulation tissue noted at the ulcer site  No drainage or redness or infection noted. NAILS  Thick disfigured discolored nails both feet.   Dx.   Ulcer left forefoot.  Tx.  Debridement of necrotic tissue left forefoot. Neosporin and dry sterile dressing was applied to the foot, .  Continue home soaks and bandages.  RTC 2 weeks.  Call if ulcer worsens.   Gardiner Barefoot DPM

## 2016-08-21 ENCOUNTER — Ambulatory Visit (INDEPENDENT_AMBULATORY_CARE_PROVIDER_SITE_OTHER): Payer: PPO | Admitting: Orthopedic Surgery

## 2016-08-30 ENCOUNTER — Encounter: Payer: Self-pay | Admitting: Podiatry

## 2016-08-30 ENCOUNTER — Ambulatory Visit (INDEPENDENT_AMBULATORY_CARE_PROVIDER_SITE_OTHER): Payer: PPO | Admitting: Podiatry

## 2016-08-30 VITALS — BP 131/68 | HR 87 | Resp 14

## 2016-08-30 DIAGNOSIS — L97521 Non-pressure chronic ulcer of other part of left foot limited to breakdown of skin: Secondary | ICD-10-CM

## 2016-08-30 NOTE — Progress Notes (Signed)
Patient presents to the office for continued evaluation of an ulcer on her left forefoot. She has been not soaking her foot but has bandaging ner foot.  She says it has been painful walking.  She returns for evaluation and treatment.  GENERAL APPEARANCE: Alert, conversant. Appropriately groomed. No acute distress.  VASCULAR: Pedal pulses are  palpable at  [facility] and PT bilateral.  Capillary refill time is immediate to all digits,  Normal temperature gradient.  Digital hair growth is present bilateral  NEUROLOGIC: sensation is diminished to 5.07 monofilament at 5/5 sites bilateral.  Light touch is intact bilateral, Muscle strength normal.  MUSCULOSKELETAL: acceptable muscle strength, tone and stability bilateral.  Intrinsic muscluature intact bilateral.  Rectus appearance of foot and digits noted bilateral.   DERMATOLOGIC: skin color, texture, and turgor are within normal limits.  No drainage noted. There is a 1 mm. X 1 mm.  Ulcer on left forefoot. Healthy granulation tissue noted at the ulcer site  No drainage or redness or infection noted. Callus noted around the ulcer. NAILS  Thick disfigured discolored nails both feet.   Dx.   Ulcer left forefoot.  Tx.  Debridement of necrotic tissue left forefoot. Neosporin and dry sterile dressing was applied to the foot, .  Continue home soaks and bandages.  RTC 2 weeks.  Call if ulcer worsens.   Gardiner Barefoot DPM

## 2016-08-31 DIAGNOSIS — I10 Essential (primary) hypertension: Secondary | ICD-10-CM | POA: Diagnosis not present

## 2016-08-31 DIAGNOSIS — I428 Other cardiomyopathies: Secondary | ICD-10-CM | POA: Diagnosis not present

## 2016-08-31 DIAGNOSIS — I7389 Other specified peripheral vascular diseases: Secondary | ICD-10-CM | POA: Diagnosis not present

## 2016-08-31 DIAGNOSIS — E11621 Type 2 diabetes mellitus with foot ulcer: Secondary | ICD-10-CM | POA: Diagnosis not present

## 2016-08-31 DIAGNOSIS — I5032 Chronic diastolic (congestive) heart failure: Secondary | ICD-10-CM | POA: Diagnosis not present

## 2016-08-31 DIAGNOSIS — Z9861 Coronary angioplasty status: Secondary | ICD-10-CM | POA: Diagnosis not present

## 2016-08-31 DIAGNOSIS — E1159 Type 2 diabetes mellitus with other circulatory complications: Secondary | ICD-10-CM | POA: Diagnosis not present

## 2016-08-31 DIAGNOSIS — Z6828 Body mass index (BMI) 28.0-28.9, adult: Secondary | ICD-10-CM | POA: Diagnosis not present

## 2016-08-31 DIAGNOSIS — E1129 Type 2 diabetes mellitus with other diabetic kidney complication: Secondary | ICD-10-CM | POA: Diagnosis not present

## 2016-08-31 DIAGNOSIS — R1903 Right lower quadrant abdominal swelling, mass and lump: Secondary | ICD-10-CM | POA: Diagnosis not present

## 2016-08-31 DIAGNOSIS — E784 Other hyperlipidemia: Secondary | ICD-10-CM | POA: Diagnosis not present

## 2016-08-31 DIAGNOSIS — E1140 Type 2 diabetes mellitus with diabetic neuropathy, unspecified: Secondary | ICD-10-CM | POA: Diagnosis not present

## 2016-09-05 ENCOUNTER — Other Ambulatory Visit: Payer: Self-pay | Admitting: Internal Medicine

## 2016-09-08 ENCOUNTER — Other Ambulatory Visit: Payer: Self-pay | Admitting: Internal Medicine

## 2016-09-08 DIAGNOSIS — R1901 Right upper quadrant abdominal swelling, mass and lump: Secondary | ICD-10-CM

## 2016-09-13 ENCOUNTER — Ambulatory Visit (INDEPENDENT_AMBULATORY_CARE_PROVIDER_SITE_OTHER): Payer: PPO | Admitting: Podiatry

## 2016-09-13 DIAGNOSIS — L97521 Non-pressure chronic ulcer of other part of left foot limited to breakdown of skin: Secondary | ICD-10-CM | POA: Diagnosis not present

## 2016-09-13 NOTE — Progress Notes (Signed)
Patient presents to the office for continued evaluation of an ulcer on her left forefoot. She has been not soaking her foot but has bandaging ner foot.  She says it has been painful walking.  She returns for evaluation and treatment.  GENERAL APPEARANCE: Alert, conversant. Appropriately groomed. No acute distress.  VASCULAR: Pedal pulses are  palpable at  [facility] and PT bilateral.  Capillary refill time is immediate to all digits,  Normal temperature gradient.  Digital hair growth is present bilateral  NEUROLOGIC: sensation is diminished to 5.07 monofilament at 5/5 sites bilateral.  Light touch is intact bilateral, Muscle strength normal.  MUSCULOSKELETAL: acceptable muscle strength, tone and stability bilateral.  Intrinsic muscluature intact bilateral.  Rectus appearance of foot and digits noted bilateral.   DERMATOLOGIC: skin color, texture, and turgor are within normal limits.  No drainage noted. There is a 1 mm. X 1 mm.  Ulcer on left forefoot. Healthy granulation tissue noted at the ulcer site  No drainage or redness or infection noted. Callus noted around the ulcer. NAILS  Thick disfigured discolored nails both feet.   Dx.   Ulcer left forefoot.  Tx.  Debridement of necrotic tissue left forefoot. Neosporin and dry sterile dressing was applied to the foot, .  Continue home soaks and bandages.  RTC 2 weeks. Dispersion pad applied to her shoe to off load foot at site of ulcer. Call if ulcer worsens.   Gardiner Barefoot DPM

## 2016-09-15 ENCOUNTER — Ambulatory Visit
Admission: RE | Admit: 2016-09-15 | Discharge: 2016-09-15 | Disposition: A | Discharge status: Home / Self Care | Payer: PPO | Source: Ambulatory Visit | Attending: Internal Medicine | Admitting: Internal Medicine

## 2016-09-15 DIAGNOSIS — R1901 Right upper quadrant abdominal swelling, mass and lump: Secondary | ICD-10-CM

## 2016-09-15 DIAGNOSIS — R1900 Intra-abdominal and pelvic swelling, mass and lump, unspecified site: Secondary | ICD-10-CM | POA: Diagnosis not present

## 2016-09-28 DIAGNOSIS — Z961 Presence of intraocular lens: Secondary | ICD-10-CM | POA: Diagnosis not present

## 2016-10-04 ENCOUNTER — Encounter: Payer: Self-pay | Admitting: Podiatry

## 2016-10-04 ENCOUNTER — Ambulatory Visit (INDEPENDENT_AMBULATORY_CARE_PROVIDER_SITE_OTHER): Payer: PPO | Admitting: Podiatry

## 2016-10-04 ENCOUNTER — Ambulatory Visit: Payer: PPO | Admitting: Podiatry

## 2016-10-04 VITALS — Ht 68.0 in | Wt 178.0 lb

## 2016-10-04 DIAGNOSIS — L97521 Non-pressure chronic ulcer of other part of left foot limited to breakdown of skin: Secondary | ICD-10-CM | POA: Diagnosis not present

## 2016-10-04 DIAGNOSIS — I739 Peripheral vascular disease, unspecified: Secondary | ICD-10-CM

## 2016-10-04 NOTE — Progress Notes (Signed)
Patient presents to the office for continued evaluation of an ulcer on her left forefoot. She says she keeps developing callus on her left forefoot which is painful to walk.  She initially had skin surgery that never healed by an orthopedic doctor.  He wanted to perform surgery and she presented to my office for second opinion.  I attempted to close the skin lesion but it has remained open.  .    She returns for evaluation and treatment.  GENERAL APPEARANCE: Alert, conversant. Appropriately groomed. No acute distress.  VASCULAR: Pedal pulses are  palpable at  [facility] and PT bilateral.  Capillary refill time is immediate to all digits,  Normal temperature gradient.  Digital hair growth is present bilateral  NEUROLOGIC: sensation is diminished to 5.07 monofilament at 5/5 sites bilateral.  Light touch is intact bilateral, Muscle strength normal.  MUSCULOSKELETAL: acceptable muscle strength, tone and stability bilateral.  Intrinsic muscluature intact bilateral.  Rectus appearance of foot and digits noted bilateral.   DERMATOLOGIC: skin color, texture, and turgor are within normal limits.  No drainage noted. There is 3 x 3 mm. Marland Kitchen  Ulcer on left forefoot. Healthy granulation tissue noted at the ulcer site  No drainage or redness or infection noted. Callus noted around the ulcer. NAILS  Thick disfigured discolored nails both feet.   Dx.   Ulcer left forefoot.  Tx.  Debridement of necrotic tissue left forefoot. Neosporin and dry sterile dressing was applied to the foot, .  Continue home soaks and bandages.  RTC 2 weeks. Dispersion pad applied to her shoe to off load foot at site of ulcer. Call if ulcer worsens.   Gardiner Barefoot DPM

## 2016-10-05 ENCOUNTER — Ambulatory Visit (INDEPENDENT_AMBULATORY_CARE_PROVIDER_SITE_OTHER): Payer: PPO

## 2016-10-05 DIAGNOSIS — B351 Tinea unguium: Secondary | ICD-10-CM

## 2016-11-02 ENCOUNTER — Encounter: Payer: Self-pay | Admitting: Podiatry

## 2016-11-02 ENCOUNTER — Other Ambulatory Visit: Payer: PPO

## 2016-11-02 ENCOUNTER — Ambulatory Visit (INDEPENDENT_AMBULATORY_CARE_PROVIDER_SITE_OTHER): Payer: PPO | Admitting: Podiatry

## 2016-11-02 DIAGNOSIS — B351 Tinea unguium: Secondary | ICD-10-CM

## 2016-11-02 DIAGNOSIS — I7389 Other specified peripheral vascular diseases: Secondary | ICD-10-CM

## 2016-11-02 DIAGNOSIS — L97521 Non-pressure chronic ulcer of other part of left foot limited to breakdown of skin: Secondary | ICD-10-CM

## 2016-11-02 DIAGNOSIS — L84 Corns and callosities: Secondary | ICD-10-CM | POA: Diagnosis not present

## 2016-11-02 DIAGNOSIS — M79674 Pain in right toe(s): Secondary | ICD-10-CM

## 2016-11-02 DIAGNOSIS — M79673 Pain in unspecified foot: Secondary | ICD-10-CM

## 2016-11-02 DIAGNOSIS — M79675 Pain in left toe(s): Secondary | ICD-10-CM

## 2016-11-02 DIAGNOSIS — E1159 Type 2 diabetes mellitus with other circulatory complications: Secondary | ICD-10-CM

## 2016-11-02 NOTE — Progress Notes (Signed)
Patient presents to the office for continued evaluation of an ulcer on her left forefoot. She says she keeps developing callus on her left forefoot which is painful to walk.  She initially had skin surgery that never healed by an orthopedic doctor.  She says the ulcer has closed and no drainage is present.  She says she has worn her surgical shoe with dispersion padding most of the time.    She returns for evaluation and treatment.  GENERAL APPEARANCE: Alert, conversant. Appropriately groomed. No acute distress.  VASCULAR: Pedal pulses are  palpable at  [facility] and PT bilateral.  Capillary refill time is immediate to all digits,  Normal temperature gradient.  Digital hair growth is present bilateral  NEUROLOGIC: sensation is diminished to 5.07 monofilament at 5/5 sites bilateral.  Light touch is intact bilateral, Muscle strength normal.  MUSCULOSKELETAL: acceptable muscle strength, tone and stability bilateral.  Intrinsic muscluature intact bilateral.  Rectus appearance of foot and digits noted bilateral.   DERMATOLOGIC: skin color, texture, and turgor are within normal limits.   Her ulcer has closed.   No drainage or redness or infection noted. Marland Kitchen NAILS  Thick disfigured discolored nails both feet.   Dx.   Onychomycosis  B/L  Porokeratosis  Left forefoot.  Tx.  Debridement of nails  B/L.  Debridement of porokeratosis.  Patient was evaluated by with in an effort to help to prevent future recurrence of the ulcer on her left forefoot. He made her a dispersion padding out of the diabetic inserts for today.  He will make her orthotics with dispersion padding for permanent use in her shoes. She will follow up with Liliane Channel  in 4 weeks. She should follow-up with me for preventative foot care services in 10 weeks   Gardiner Barefoot [facility]

## 2016-11-23 ENCOUNTER — Other Ambulatory Visit: Payer: PPO

## 2016-11-28 ENCOUNTER — Other Ambulatory Visit: Payer: PPO

## 2016-12-07 ENCOUNTER — Encounter: Payer: Self-pay | Admitting: Podiatry

## 2016-12-07 ENCOUNTER — Ambulatory Visit (INDEPENDENT_AMBULATORY_CARE_PROVIDER_SITE_OTHER): Payer: PPO | Admitting: Podiatry

## 2016-12-07 DIAGNOSIS — I739 Peripheral vascular disease, unspecified: Secondary | ICD-10-CM

## 2016-12-07 DIAGNOSIS — B351 Tinea unguium: Secondary | ICD-10-CM

## 2016-12-07 DIAGNOSIS — J069 Acute upper respiratory infection, unspecified: Secondary | ICD-10-CM | POA: Diagnosis not present

## 2016-12-07 DIAGNOSIS — E1159 Type 2 diabetes mellitus with other circulatory complications: Secondary | ICD-10-CM

## 2016-12-07 DIAGNOSIS — M79676 Pain in unspecified toe(s): Secondary | ICD-10-CM | POA: Diagnosis not present

## 2016-12-07 DIAGNOSIS — L84 Corns and callosities: Secondary | ICD-10-CM | POA: Diagnosis not present

## 2016-12-07 DIAGNOSIS — R05 Cough: Secondary | ICD-10-CM | POA: Diagnosis not present

## 2016-12-07 NOTE — Progress Notes (Signed)
Patient presents to the office for continued evaluation of an ulcer on her left foot.   She also says she has painful long thick nails, which are painful walking and wearing her shoes. She says there is a  callus that has developed at the site of the left forefoot ulcer. This area is painful to walk. She says that she is been using a pumice  stone, but there continues to be pain. She presents the office today for continued evaluation and treatment of this left forefoot ulcer.  She also requests nail treatment.  She has been evaluated by Liliane Channel and has been wearing her dispersion insoles.    GENERAL APPEARANCE: Alert, conversant. Appropriately groomed. No acute distress.  VASCULAR: Pedal pulses are  palpable at  [facility] and PT bilateral.  Capillary refill time is immediate to all digits,  Normal temperature gradient.  Digital hair growth is present bilateral  NEUROLOGIC: sensation is diminished to 5.07 monofilament at 5/5 sites bilateral.  Light touch is intact bilateral, Muscle strength normal.  MUSCULOSKELETAL: acceptable muscle strength, tone and stability bilateral.  Intrinsic muscluature intact bilateral.  Rectus appearance of foot and digits noted bilateral.   DERMATOLOGIC: skin color, texture, and turgor are within normal limits.    No drainage or redness or infection noted. .There is longitudinal opening in the center of left forefoot callus.   NAILS  Thick disfigured discolored nails both feet.   Dx.   Onychomycosis  B/L   Pre-ulcerous/ulcer Left forefoot.  Tx.  Debridement of nails  B/L.  Debridement of pre-ulcerous lesion left forefoot..  The slitlike longitudinal ulcer was then bandaged with Neosporin and a dry sterile dressing. Patient was told to continue home soaks and bandages. She is to return the office in 4 weeks for continued evaluation of her pre-ulcerous callus left foot   Gardiner Barefoot DPM

## 2016-12-08 ENCOUNTER — Other Ambulatory Visit: Payer: Self-pay | Admitting: Internal Medicine

## 2016-12-08 DIAGNOSIS — R222 Localized swelling, mass and lump, trunk: Secondary | ICD-10-CM

## 2016-12-14 ENCOUNTER — Ambulatory Visit
Admission: RE | Admit: 2016-12-14 | Discharge: 2016-12-14 | Disposition: A | Discharge status: Home / Self Care | Payer: PPO | Source: Ambulatory Visit | Attending: Internal Medicine | Admitting: Internal Medicine

## 2016-12-14 DIAGNOSIS — R222 Localized swelling, mass and lump, trunk: Secondary | ICD-10-CM

## 2016-12-14 DIAGNOSIS — E042 Nontoxic multinodular goiter: Secondary | ICD-10-CM | POA: Diagnosis not present

## 2016-12-14 MED ORDER — IOPAMIDOL (ISOVUE-300) INJECTION 61%
75.0000 mL | Freq: Once | INTRAVENOUS | Status: AC | PRN
  Administered 2016-12-14: 75 mL via INTRAVENOUS

## 2017-01-05 ENCOUNTER — Ambulatory Visit: Payer: PPO | Admitting: Podiatry

## 2017-01-08 DIAGNOSIS — I10 Essential (primary) hypertension: Secondary | ICD-10-CM | POA: Diagnosis not present

## 2017-01-08 DIAGNOSIS — Z794 Long term (current) use of insulin: Secondary | ICD-10-CM | POA: Diagnosis not present

## 2017-01-08 DIAGNOSIS — E1140 Type 2 diabetes mellitus with diabetic neuropathy, unspecified: Secondary | ICD-10-CM | POA: Diagnosis not present

## 2017-01-08 DIAGNOSIS — I5032 Chronic diastolic (congestive) heart failure: Secondary | ICD-10-CM | POA: Diagnosis not present

## 2017-01-08 DIAGNOSIS — Z6827 Body mass index (BMI) 27.0-27.9, adult: Secondary | ICD-10-CM | POA: Diagnosis not present

## 2017-01-08 DIAGNOSIS — E1129 Type 2 diabetes mellitus with other diabetic kidney complication: Secondary | ICD-10-CM | POA: Diagnosis not present

## 2017-01-08 DIAGNOSIS — R5383 Other fatigue: Secondary | ICD-10-CM | POA: Diagnosis not present

## 2017-01-08 DIAGNOSIS — E785 Hyperlipidemia, unspecified: Secondary | ICD-10-CM | POA: Diagnosis not present

## 2017-01-08 DIAGNOSIS — E11621 Type 2 diabetes mellitus with foot ulcer: Secondary | ICD-10-CM | POA: Diagnosis not present

## 2017-01-08 DIAGNOSIS — E048 Other specified nontoxic goiter: Secondary | ICD-10-CM | POA: Diagnosis not present

## 2017-01-08 DIAGNOSIS — E1159 Type 2 diabetes mellitus with other circulatory complications: Secondary | ICD-10-CM | POA: Diagnosis not present

## 2017-01-08 DIAGNOSIS — E049 Nontoxic goiter, unspecified: Secondary | ICD-10-CM | POA: Diagnosis not present

## 2017-01-09 ENCOUNTER — Ambulatory Visit: Payer: PPO | Admitting: Podiatry

## 2017-01-09 ENCOUNTER — Encounter: Payer: Self-pay | Admitting: Interventional Cardiology

## 2017-01-09 ENCOUNTER — Ambulatory Visit (INDEPENDENT_AMBULATORY_CARE_PROVIDER_SITE_OTHER): Payer: PPO | Admitting: Interventional Cardiology

## 2017-01-09 VITALS — BP 140/60 | HR 99 | Ht 68.0 in | Wt 173.8 lb

## 2017-01-09 DIAGNOSIS — E0590 Thyrotoxicosis, unspecified without thyrotoxic crisis or storm: Secondary | ICD-10-CM | POA: Diagnosis not present

## 2017-01-09 DIAGNOSIS — E782 Mixed hyperlipidemia: Secondary | ICD-10-CM | POA: Diagnosis not present

## 2017-01-09 DIAGNOSIS — I25119 Atherosclerotic heart disease of native coronary artery with unspecified angina pectoris: Secondary | ICD-10-CM

## 2017-01-09 DIAGNOSIS — I739 Peripheral vascular disease, unspecified: Secondary | ICD-10-CM | POA: Diagnosis not present

## 2017-01-09 MED ORDER — ASPIRIN EC 81 MG PO TBEC: 81.0000 mg | DELAYED_RELEASE_TABLET | Freq: Every day | ORAL | 3 refills | Status: DC

## 2017-01-09 NOTE — Progress Notes (Signed)
Cardiology Office Note   Date:  01/09/2017   ID:  Matthews, Jeanette April 28, 1932, MRN [id number]  PCP:  Jeanette Redwood, MD    No chief complaint on file.  CAD  Wt Readings from Last 3 Encounters:  01/09/17 173 lb 12.8 oz (78.8 kg)  10/04/16 178 lb (80.7 kg)  08/10/16 178 lb (80.7 kg)       History of Present Illness: [name] is a 83 y.o. female  who had a circumflex stent placed in Jan 2014. She had a PTCA of the OM. She has a CTO of the RCA with good collaterals. She has not had anginal symptoms on medical therapy.  She has had a chronic sore on her foot.  She had a vascular w/u that showed PAD but no revasc could be performed.  It had healed, but then recurred.  She is followed by a podiatrist for the left foot wound.  She also has a chest mass which is enlarging thyroid, multinodular.  THere is tracheal deviation.    Denies : Chest pain. Dizziness. Leg edema. Nitroglycerin use. Orthopnea. Palpitations. Paroxysmal nocturnal dyspnea. Shortness of breath. Syncope.   She only reports chronic fatigue.     Past Medical History:  Diagnosis Date  . Anxiety   . CAD (coronary artery disease)   . Cellulitis 10/2015   LEFT FOOT  . Complication of anesthesia   . Coronary artery disease   . Diabetes mellitus without complication (HCC)    insulin dependent  . GERD (gastroesophageal reflux disease)   . Hypertension   . Hypothyroidism   . Neuromuscular disorder (HCC)    muscle cramps to lower extremities  . Other primary cardiomyopathies   . PONV (postoperative nausea and vomiting)   . Shortness of breath     Past Surgical History:  Procedure Laterality Date  . ABDOMINAL HYSTERECTOMY    . CHOLECYSTECTOMY    . CORONARY ANGIOPLASTY WITH STENT PLACEMENT  08/09/2011   DES  to mid circumflex  . I&D EXTREMITY Left 10/29/2015   Procedure: Irrigation and Debridement Left Foot;  Surgeon: Newt Minion, MD;  Location: [facility];  Service: Orthopedics;  Laterality: Left;  . LEFT  HEART CATHETERIZATION WITH CORONARY ANGIOGRAM N/A 08/08/2012   Procedure: LEFT HEART CATHETERIZATION WITH CORONARY ANGIOGRAM;  Surgeon: [name], MD;  Location: [facility] CATH LAB;  Service: Cardiovascular;  Laterality: N/A;  . PERIPHERAL VASCULAR CATHETERIZATION N/A 12/30/2014   Procedure: Lower Extremity Angiography;  Surgeon: Wellington Hampshire, MD;  Location: [facility] CV LAB;  Service: Cardiovascular;  Laterality: N/A;  . THYROID SURGERY     radioactive iodine   . TONSILLECTOMY       Current Outpatient Prescriptions  Medication Sig Dispense Refill  . amLODipine (NORVASC) 10 MG tablet TAKE ONE TABLET BY MOUTH ONCE DAILY 90 tablet 1  . aspirin 325 MG EC tablet Take 325 mg by mouth at bedtime.     Marland Kitchen atorvastatin (LIPITOR) 20 MG tablet Take 1 tablet (20 mg total) by mouth daily. 90 tablet 0  . B Complex-C (SUPER B COMPLEX PO) Take 1 tablet by mouth daily.    . carvedilol (COREG) 25 MG tablet Take 1 tablet (25 mg total) by mouth 2 (two) times daily with a meal. *Please call and schedule an appointment with Dr Fletcher Anon* 60 tablet 0  . Cholecalciferol (VITAMIN D PO) Take 1 tablet by mouth daily.    Marland Kitchen LEVEMIR FLEXTOUCH 100 UNIT/ML Pen Inject 70 Units into the  skin daily at 10 pm.     . lisinopril (PRINIVIL,ZESTRIL) 20 MG tablet Take 20 mg by mouth daily.     . Multiple Vitamin (MULTIVITAMIN) capsule Take 1 capsule by mouth daily.    . nitroGLYCERIN (NITROSTAT) 0.4 MG SL tablet Place 0.4 mg under the tongue every 5 (five) minutes as needed for chest pain (x 3 doses for chest pains).    . NOVOLOG FLEXPEN 100 UNIT/ML FlexPen Inject 20 Units into the skin 3 (three) times daily with meals.     . Omega-3 Fatty Acids (FISH OIL) 1200 MG CAPS Take 2,400 mg by mouth 2 (two) times daily.    . pantoprazole (PROTONIX) 40 MG tablet TAKE ONE TABLET BY MOUTH ONCE DAILY AT  6  AM 30 tablet 9  . PARoxetine (PAXIL) 20 MG tablet Take 20 mg by mouth daily with breakfast. Reported on 11/29/2015    . VITAMIN E PO Take  1 tablet by mouth daily. Reported on 11/29/2015     No current facility-administered medications for this visit.     Allergies:   Sulfa antibiotics    Social History:  The patient  reports that she quit smoking about 56 years ago. She has never used smokeless tobacco. She reports that she drinks alcohol. She reports that she does not use drugs.   Family History:  The patient's family history includes Diabetes in her sister; Heart attack in her father and grandchild; Heart disease in her father and son; Sudden death in her grandchild.    ROS:  Please see the history of present illness.   Otherwise, review of systems are positive for fatigue.   All other systems are reviewed and negative.    PHYSICAL EXAM: VS:  BP 140/60   Pulse 99   Ht 5' 8"$  (1.727 m)   Wt 173 lb 12.8 oz (78.8 kg)   SpO2 96%   BMI 26.43 kg/m  , BMI Body mass index is 26.43 kg/m. GEN: Well nourished, well developed, in no acute distress  HEENT: normal  Neck: no JVD, carotid bruits, or masses Cardiac:RRR; 2/6 systolic murmur, no rubs, or gallops,no edema ; dressing on left foot Respiratory:  clear to auscultation bilaterally, normal work of breathing GI: soft, nontender, nondistended, + BS MS: no deformity or atrophy  Skin: warm and dry, no rash Neuro:  Strength and sensation are intact Psych: euthymic mood, full affect   EKG:   The ekg ordered today demonstrates NSR, LVH with associated ST changes.  No significant change compared to 2017 ECG.   Recent Labs: No results found for requested labs within last 8760 hours.   Lipid Panel    Component Value Date/Time   CHOL 141 12/21/2014 0733   TRIG (H) 12/21/2014 0733    442.0 Triglyceride is over 400; calculations on Lipids are invalid.   HDL 29.10 (L) 12/21/2014 0733   CHOLHDL 5 12/21/2014 0733   VLDL 56.6 (H) 06/22/2014 0737   LDLCALC 24 10/20/2013 0739   LDLDIRECT 46.0 12/21/2014 0733     Other studies Reviewed: Additional studies/ records that  were reviewed today with results demonstrating: chest CT results reviewed.  .   ASSESSMENT AND PLAN:  1. CAD : Decrease aspirin to 81 mg daily. No angina on medical therapy.   2. PAD: Known tibial vessel disease with chronic wound.  Managed by podiatry.  She os trying to avoid any major surgery.  She was ssen by Dr. Fletcher Anon in the past. 3. Hyperlipidemia:  If repeat  lipids are not at goal, LDL < 100, would increase atorvastatin to 40 mg daily.  SHe will f/u with Dr. Brigitte Pulse.   Current medicines are reviewed at length with the patient today.  The patient concerns regarding her medicines were addressed.  The following changes have been made:  No change  Labs/ tests ordered today include:  No orders of the defined types were placed in this encounter.   Recommend 150 minutes/week of aerobic exercise Low fat, low carb, high fiber diet recommended  Disposition:   FU in 1 year   Signed, Larae Grooms, MD  01/09/2017 3:41 PM    [facility] [place], [address], [place]  [address] Phone: [phone]; Fax: [phone]

## 2017-01-09 NOTE — Patient Instructions (Signed)
Medication Instructions:  Your physician has recommended you make the following change in your medication:   DECREASE Aspirin to 81 mg daily  Labwork: None ordered  Testing/Procedures: None ordered   Follow-Up: Your physician wants you to follow-up in: 1 year with Dr. Varanasi. You will receive a reminder letter in the mail two months in advance. If you don't receive a letter, please call our office to schedule the follow-up appointment.   Any Other Special Instructions Will Be Listed Below (If Applicable).     If you need a refill on your cardiac medications before your next appointment, please call your pharmacy.  

## 2017-01-10 ENCOUNTER — Encounter (INDEPENDENT_AMBULATORY_CARE_PROVIDER_SITE_OTHER): Payer: Self-pay | Admitting: Orthopedic Surgery

## 2017-01-10 ENCOUNTER — Ambulatory Visit (INDEPENDENT_AMBULATORY_CARE_PROVIDER_SITE_OTHER): Payer: PPO

## 2017-01-10 ENCOUNTER — Ambulatory Visit (INDEPENDENT_AMBULATORY_CARE_PROVIDER_SITE_OTHER): Payer: PPO | Admitting: Orthopedic Surgery

## 2017-01-10 DIAGNOSIS — M25512 Pain in left shoulder: Secondary | ICD-10-CM

## 2017-01-10 DIAGNOSIS — M25511 Pain in right shoulder: Secondary | ICD-10-CM

## 2017-01-10 DIAGNOSIS — G8929 Other chronic pain: Secondary | ICD-10-CM

## 2017-01-11 DIAGNOSIS — M25512 Pain in left shoulder: Secondary | ICD-10-CM | POA: Diagnosis not present

## 2017-01-11 DIAGNOSIS — G8929 Other chronic pain: Secondary | ICD-10-CM | POA: Diagnosis not present

## 2017-01-11 DIAGNOSIS — M25511 Pain in right shoulder: Secondary | ICD-10-CM | POA: Diagnosis not present

## 2017-01-11 MED ORDER — METHYLPREDNISOLONE ACETATE 40 MG/ML IJ SUSP
40.0000 mg | INTRAMUSCULAR | Status: AC | PRN
  Administered 2017-01-11: 40 mg via INTRA_ARTICULAR

## 2017-01-11 MED ORDER — BUPIVACAINE HCL 0.5 % IJ SOLN
9.0000 mL | INTRAMUSCULAR | Status: AC | PRN
  Administered 2017-01-11: 9 mL via INTRA_ARTICULAR

## 2017-01-11 MED ORDER — LIDOCAINE HCL 1 % IJ SOLN
5.0000 mL | INTRAMUSCULAR | Status: AC | PRN
  Administered 2017-01-11: 5 mL

## 2017-01-11 NOTE — Progress Notes (Signed)
Office Visit Note   Patient: [name]           Date of Birth: 12-07-1931           MRN: [id number] Visit Date: 01/10/2017 Requested by: Marton Redwood, MD [address] [place], [place] [address] PCP: Marton Redwood, MD  Subjective: Chief Complaint  Patient presents with  . Right Shoulder - Pain  . Left Shoulder - Pain    HPI: [name] is a 83 year old patient with bilateral shoulder pain right worse than left.  She reports pain for several weeks.  She feels like it may be from lifting the close basket and the other week.  Reports pain comes and goes depending on activity.  She has what right-hand dominant.  The pain will occasionally wake her from sleep at night.  She has known history of bulging disc in her neck.  She has diabetes as well.  She does take insulin.  She's not didn't really any surgical intervention.              ROS: All systems reviewed are negative as they relate to the chief complaint within the history of present illness.  Patient denies  fevers or chills.   Assessment & Plan: Visit Diagnoses:  1. Chronic pain of both shoulders     Plan: Impression is bilateral shoulder pain right worse than left with rotator cuff arthropathy and cuff weakness more on the right-hand side.  Plan is for injection into the right shoulder today along with a course of physical therapy.  3 month return for clinical recheck.  She may be able to recover some of her functional strength with physical therapy.  She asked about arthroscopic intervention for this problem but the rotator cuff pathology is likely long-standing and not repairable at this time.  Follow-Up Instructions: Return in about 3 months (around 04/12/2017).   Orders:  Orders Placed This Encounter  Procedures  . XR Shoulder Right  . XR Shoulder Left   No orders of the defined types were placed in this encounter.     Procedures: Large Joint Inj Date/Time: 01/11/2017 5:25 PM Performed by: Meredith Pel Authorized by: Meredith Pel   Consent Given by:  Patient Site marked: the procedure site was marked   Timeout: prior to procedure the correct patient, procedure, and site was verified   Indications:  Pain and diagnostic evaluation Location:  Shoulder Site:  R glenohumeral Prep: patient was prepped and draped in usual sterile fashion   Needle Size:  18 G Needle Length:  1.5 inches Approach:  Posterior Ultrasound Guidance: No   Fluoroscopic Guidance: No   Arthrogram: No   Medications:  5 mL lidocaine 1 %; 9 mL bupivacaine 0.5 %; 40 mg methylPREDNISolone acetate 40 MG/ML Aspiration Attempted: No   Patient tolerance:  Patient tolerated the procedure well with no immediate complications     Clinical Data: No additional findings.  Objective: Vital Signs: There were no vitals taken for this visit.  Physical Exam:   Constitutional: Patient appears well-developed HEENT:  Head: Normocephalic Eyes:EOM are normal Neck: Normal range of motion Cardiovascular: Normal rate Pulmonary/chest: Effort normal Neurologic: Patient is alert Skin: Skin is warm Psychiatric: Patient has normal mood and affect    Ortho Exam: Orthopedic exam demonstrates good cervical spine range of motion she has for flexion and abduction actively well below 90 on the right-hand side.  Cuff strength is weak more on the right than the left with infraspinatus  super space and subscap muscle testing.  I don't detect a lot of course grinding or crepitus with active or passive range of motion of that right shoulder.  Motor sensory function in the hands intact radial pulses intact.  No other masses lamina after skin changes noted in the right or left shoulder girdle region.  No acromioclavicular joint tenderness is present.  Active abduction and forward flexion on the left is closer to 90.  Cuff strength also is improved on this side compared to the right  Specialty Comments:  No specialty comments  available.  Imaging: No results found.   PMFS History: Patient Active Problem List   Diagnosis Date Noted  . Achilles tendon contracture, left 07/24/2016  . Ulcer of left foot, limited to breakdown of skin ([place]) 06/01/2016  . Cellulitis 10/27/2015  . Peripheral arterial disease ([place]) 10/20/2015  . Severe peripheral arterial disease ([facility]) 12/28/2014  . Chronic diastolic heart failure ([place]) 12/30/2013  . Edema 12/30/2013  . Mixed hyperlipidemia 06/24/2013  . Mitral valve disorders(424.0) 06/24/2013  . Coronary artery disease   . Hypertension   . Diabetes mellitus without complication ([place])   . Other primary cardiomyopathies    Past Medical History:  Diagnosis Date  . Anxiety   . CAD (coronary artery disease)   . Cellulitis 10/2015   LEFT FOOT  . Complication of anesthesia   . Coronary artery disease   . Diabetes mellitus without complication (HCC)    insulin dependent  . GERD (gastroesophageal reflux disease)   . Hypertension   . Hypothyroidism   . Neuromuscular disorder (HCC)    muscle cramps to lower extremities  . Other primary cardiomyopathies   . PONV (postoperative nausea and vomiting)   . Shortness of breath     Family History  Problem Relation Age of Onset  . Heart disease Father   . Heart attack Father   . Diabetes Sister   . Heart disease Son        before age 57  . Heart attack 66        83yr old  . Sudden death Grandchild   . Hypertension Neg Hx     Past Surgical History:  Procedure Laterality Date  . ABDOMINAL HYSTERECTOMY    . CHOLECYSTECTOMY    . CORONARY ANGIOPLASTY WITH STENT PLACEMENT  08/09/2011   DES  to mid circumflex  . I&D EXTREMITY Left 10/29/2015   Procedure: Irrigation and Debridement Left Foot;  Surgeon: Newt Minion, MD;  Location: [facility];  Service: Orthopedics;  Laterality: Left;  . LEFT HEART CATHETERIZATION WITH CORONARY ANGIOGRAM N/A 08/08/2012   Procedure: LEFT HEART CATHETERIZATION WITH CORONARY ANGIOGRAM;  Surgeon:  [name], MD;  Location: [facility] CATH LAB;  Service: Cardiovascular;  Laterality: N/A;  . PERIPHERAL VASCULAR CATHETERIZATION N/A 12/30/2014   Procedure: Lower Extremity Angiography;  Surgeon: Wellington Hampshire, MD;  Location: [facility] CV LAB;  Service: Cardiovascular;  Laterality: N/A;  . THYROID SURGERY     radioactive iodine   . TONSILLECTOMY     Social History   Occupational History  . Not on file.   Social History Main Topics  . Smoking status: Former Smoker    Quit date: 11/28/1960  . Smokeless tobacco: Never Used  . Alcohol use 0.0 oz/week     Comment: rare  . Drug use: No  . Sexual activity: Not on file

## 2017-02-08 ENCOUNTER — Ambulatory Visit (INDEPENDENT_AMBULATORY_CARE_PROVIDER_SITE_OTHER): Payer: PPO | Admitting: Podiatry

## 2017-02-08 ENCOUNTER — Encounter: Payer: Self-pay | Admitting: Podiatry

## 2017-02-08 DIAGNOSIS — L03119 Cellulitis of unspecified part of limb: Secondary | ICD-10-CM

## 2017-02-08 DIAGNOSIS — L97521 Non-pressure chronic ulcer of other part of left foot limited to breakdown of skin: Secondary | ICD-10-CM | POA: Diagnosis not present

## 2017-02-08 DIAGNOSIS — L02619 Cutaneous abscess of unspecified foot: Secondary | ICD-10-CM

## 2017-02-08 MED ORDER — AMOXICILLIN-POT CLAVULANATE 500-125 MG PO TABS: 1.0000 | ORAL_TABLET | Freq: Three times a day (TID) | ORAL | 0 refills | Status: DC

## 2017-02-08 NOTE — Progress Notes (Signed)
Patient presents to the office for continued evaluation of an ulcer on her left forefoot. She says she keeps developing callus on her left forefoot which is painful to walk.  She initially had skin surgery that never healed by an orthopedic doctor.  He wanted to perform surgery and she presented to my office for second opinion.  I attempted to close the skin lesion but it has remained open.  .   She returns the office today stating that the bottom of her foot has become red, inflamed and painful.  She says it is painful walking and wearing her shoes.  She denies any drainage from the left forefoot. She presents the office today for an evaluation and treatment.  Patient says that she is not wearing her dispersion pad made by a [employer]  since it was not helpful.  GENERAL APPEARANCE: Alert, conversant. Appropriately groomed. No acute distress.  VASCULAR: Pedal pulses are  palpable at  [facility] and PT bilateral.  Capillary refill time is immediate to all digits,  Normal temperature gradient.  Digital hair growth is present bilateral  NEUROLOGIC: sensation is diminished to 5.07 monofilament at 5/5 sites bilateral.  Light touch is intact bilateral, Muscle strength normal.  MUSCULOSKELETAL: acceptable muscle strength, tone and stability bilateral.  Intrinsic muscluature intact bilateral.  Rectus appearance of foot and digits noted bilateral.   DERMATOLOGIC: skin color, texture, and turgor are within normal limits.  No drainage noted. There is 3 x 3 mm. ulcer on left forefoot. White necrotic tissue noted at the site of the ulcer . There is red inflammation noted. Distal and medial to the ulcer of the left forefoot.  No streaking is noted. NAILS  Thick disfigured discolored nails both feet.   Dx.   Ulcer left forefoot.  Cellulitis left foot  Tx.  Debridement of necrotic tissue left forefoot. Neosporin and dry sterile dressing was applied to the foot, .  Continue home soaks and bandages.  Patient requested an antibiotic  that was a penicillin derivative.  Therefore, she was prescribed Augmentin 500 175 to take 3 times a day.  Return to the office in 2 weeks for further evaluation and treatment   Gardiner Barefoot DPM

## 2017-02-09 ENCOUNTER — Telehealth: Payer: Self-pay | Admitting: Podiatry

## 2017-02-09 MED ORDER — AMOXICILLIN-POT CLAVULANATE 500-125 MG PO TABS: 1.0000 | ORAL_TABLET | Freq: Three times a day (TID) | ORAL | 0 refills | Status: DC

## 2017-02-09 NOTE — Telephone Encounter (Signed)
Pt called saying the antibiotic Augmentin was not sent to her pharmacy which is [facility] [address]. Pt's Rx was e-scribed to a [facility] in [place].

## 2017-02-09 NOTE — Telephone Encounter (Signed)
Augmentin was sent to [facility] in [place].

## 2017-02-09 NOTE — Telephone Encounter (Signed)
I'm calling back about my Rx for Augmentin. My pharmacy still has not received it. I really need it because I was supposed to have started the antibiotic yesterday. I spoke with Marcy Siren and she e-scribed it to [facility] on [address] in [place].

## 2017-02-13 DIAGNOSIS — E1159 Type 2 diabetes mellitus with other circulatory complications: Secondary | ICD-10-CM | POA: Diagnosis not present

## 2017-02-13 DIAGNOSIS — Z6826 Body mass index (BMI) 26.0-26.9, adult: Secondary | ICD-10-CM | POA: Diagnosis not present

## 2017-02-13 DIAGNOSIS — E11621 Type 2 diabetes mellitus with foot ulcer: Secondary | ICD-10-CM | POA: Diagnosis not present

## 2017-02-13 DIAGNOSIS — E048 Other specified nontoxic goiter: Secondary | ICD-10-CM | POA: Diagnosis not present

## 2017-02-22 ENCOUNTER — Telehealth: Payer: Self-pay | Admitting: Podiatry

## 2017-02-22 ENCOUNTER — Ambulatory Visit: Payer: PPO

## 2017-02-22 NOTE — Telephone Encounter (Signed)
I just received a missed call and asked to call this number back. I have an appointment with Dr. Prudence Davidson this afternoon at 1:15 pm today and apparently I need to talk to the nurse. You can call me back at [phone].

## 2017-02-22 NOTE — Telephone Encounter (Signed)
Lourena Simmonds at check out rescheduled pt for tomorrow Friday 23 February 2017 at 9:45 am.

## 2017-02-23 ENCOUNTER — Encounter: Payer: Self-pay | Admitting: Podiatry

## 2017-02-23 ENCOUNTER — Ambulatory Visit (INDEPENDENT_AMBULATORY_CARE_PROVIDER_SITE_OTHER): Payer: PPO | Admitting: Podiatry

## 2017-02-23 DIAGNOSIS — L03119 Cellulitis of unspecified part of limb: Secondary | ICD-10-CM | POA: Diagnosis not present

## 2017-02-23 DIAGNOSIS — L02619 Cutaneous abscess of unspecified foot: Secondary | ICD-10-CM | POA: Diagnosis not present

## 2017-02-23 DIAGNOSIS — L97521 Non-pressure chronic ulcer of other part of left foot limited to breakdown of skin: Secondary | ICD-10-CM | POA: Diagnosis not present

## 2017-02-23 NOTE — Progress Notes (Signed)
Patient presents to the office for continued evaluation of an ulcer on her left forefoot. She had the ulcer left foot breakdown last week and become infected.  She was treated with debridement and prescribed Augmentin and told to take 3 times a day.  She says that her left foot is doing better and there is no drainage or pain at the site of the ulcer.     She returns for evaluation and treatment.  GENERAL APPEARANCE: Alert, conversant. Appropriately groomed. No acute distress.  VASCULAR: Pedal pulses are  palpable at  [facility] and PT bilateral.  Capillary refill time is immediate to all digits,  Normal temperature gradient.  Digital hair growth is present bilateral  NEUROLOGIC: sensation is diminished to 5.07 monofilament at 5/5 sites bilateral.  Light touch is intact bilateral, Muscle strength normal.  MUSCULOSKELETAL: acceptable muscle strength, tone and stability bilateral.  Intrinsic muscluature intact bilateral.  Rectus appearance of foot and digits noted bilateral.   DERMATOLOGIC: skin color, texture, and turgor are within normal limits.  No drainage noted. The ulcerated area on her left forefoot has been replaced with hyperkeratotic tissue in the absence of redness, swelling, or pus. NAILS  Thick disfigured discolored nails both feet.   Dx.   S/P ulcer/cellulitis left foot.  Tx  return office visit.  The ulcer and the infection have healed and she is not having any pain or discomfort.  She was told to return to the insoles made by [employer].  She is to return the office for wound and nail care as needed   Gardiner Barefoot DPM

## 2017-03-02 ENCOUNTER — Ambulatory Visit: Payer: PPO | Admitting: Podiatry

## 2017-03-09 ENCOUNTER — Other Ambulatory Visit: Payer: Self-pay | Admitting: Interventional Cardiology

## 2017-04-27 DIAGNOSIS — I10 Essential (primary) hypertension: Secondary | ICD-10-CM | POA: Diagnosis not present

## 2017-05-03 DIAGNOSIS — I10 Essential (primary) hypertension: Secondary | ICD-10-CM | POA: Diagnosis not present

## 2017-05-03 DIAGNOSIS — Z1389 Encounter for screening for other disorder: Secondary | ICD-10-CM | POA: Diagnosis not present

## 2017-05-03 DIAGNOSIS — I7389 Other specified peripheral vascular diseases: Secondary | ICD-10-CM | POA: Diagnosis not present

## 2017-05-03 DIAGNOSIS — E1140 Type 2 diabetes mellitus with diabetic neuropathy, unspecified: Secondary | ICD-10-CM | POA: Diagnosis not present

## 2017-05-03 DIAGNOSIS — Z6826 Body mass index (BMI) 26.0-26.9, adult: Secondary | ICD-10-CM | POA: Diagnosis not present

## 2017-05-03 DIAGNOSIS — N182 Chronic kidney disease, stage 2 (mild): Secondary | ICD-10-CM | POA: Diagnosis not present

## 2017-05-03 DIAGNOSIS — I5032 Chronic diastolic (congestive) heart failure: Secondary | ICD-10-CM | POA: Diagnosis not present

## 2017-05-03 DIAGNOSIS — E1129 Type 2 diabetes mellitus with other diabetic kidney complication: Secondary | ICD-10-CM | POA: Diagnosis not present

## 2017-05-03 DIAGNOSIS — Z0000 Encounter for general adult medical examination without abnormal findings: Secondary | ICD-10-CM | POA: Diagnosis not present

## 2017-05-03 DIAGNOSIS — E7849 Other hyperlipidemia: Secondary | ICD-10-CM | POA: Diagnosis not present

## 2017-05-03 DIAGNOSIS — E11621 Type 2 diabetes mellitus with foot ulcer: Secondary | ICD-10-CM | POA: Diagnosis not present

## 2017-05-03 DIAGNOSIS — E1159 Type 2 diabetes mellitus with other circulatory complications: Secondary | ICD-10-CM | POA: Diagnosis not present

## 2017-05-04 ENCOUNTER — Other Ambulatory Visit: Payer: Self-pay | Admitting: Internal Medicine

## 2017-05-04 DIAGNOSIS — E042 Nontoxic multinodular goiter: Secondary | ICD-10-CM

## 2017-05-10 ENCOUNTER — Ambulatory Visit (INDEPENDENT_AMBULATORY_CARE_PROVIDER_SITE_OTHER): Payer: PPO

## 2017-05-10 ENCOUNTER — Encounter: Payer: Self-pay | Admitting: Podiatry

## 2017-05-10 ENCOUNTER — Ambulatory Visit (INDEPENDENT_AMBULATORY_CARE_PROVIDER_SITE_OTHER): Payer: PPO | Admitting: Podiatry

## 2017-05-10 DIAGNOSIS — M84374A Stress fracture, right foot, initial encounter for fracture: Secondary | ICD-10-CM | POA: Diagnosis not present

## 2017-05-10 DIAGNOSIS — S93601A Unspecified sprain of right foot, initial encounter: Secondary | ICD-10-CM

## 2017-05-10 DIAGNOSIS — M779 Enthesopathy, unspecified: Secondary | ICD-10-CM

## 2017-05-11 ENCOUNTER — Other Ambulatory Visit: Payer: PPO

## 2017-05-11 NOTE — Progress Notes (Signed)
Subjective: Shannel presents the also concerns of pain also aspect of her right foot which is been on about 2 weeks and she states that she's had swelling which gets worse during the day. She denies any recent injury or trauma to the area. She does state that she's had a stress fracture before she believes this is more towards her toe. She states the foot is painful to put pressure to the area at times. She's had no recent treatment for this. She is no other concerns today. Denies any systemic complaints such as fevers, chills, nausea, vomiting. No acute changes since last appointment, and no other complaints at this time.   Objective: AAO x3, NAD DP/PT pulses palpable bilaterally, CRT less than 3 seconds There is tenderness sharply on the fifth metatarsal shaft. There is also tenderness on the course of the peroneal tendon just inferior to the lateral malleolus on insertion into the fifth metatarsal base peroneal tendon appears to be intact. The does appear to be mild edema mostly the lateral aspect of the ankle into the foot as well and this also corresponds to the area tenderness.  No open lesions or pre-ulcerative lesions.  No pain with calf compression, swelling, warmth, erythema. No signs of DVT.   Assessment: Stress fracture right fifth metatarsal; tendinitis   Plan: -All treatment options discussed with the patient including all alternatives, risks, complications.  -X-rays were obtained and reviewed. There is questionable radiolucency in the shaft of the fifth metatarsal concerning for possible stress fracture. I showed her the x-rays today.  -Given the pain is she is having as well as swelling as well as the neuropathy recommend immobilization in a surgical boot. This was dispensed today. Compression Ace bandage was applied. Ice and elevation.  -RTC 2 weeks or sooner if needed.  -Patient encouraged to call the office with any questions, concerns, change in symptoms.   Celesta Gentile,  DPM

## 2017-05-15 ENCOUNTER — Encounter: Payer: Self-pay | Admitting: Physical Therapy

## 2017-05-15 ENCOUNTER — Ambulatory Visit: Payer: PPO | Attending: Internal Medicine | Admitting: Physical Therapy

## 2017-05-15 DIAGNOSIS — R293 Abnormal posture: Secondary | ICD-10-CM

## 2017-05-15 DIAGNOSIS — M25611 Stiffness of right shoulder, not elsewhere classified: Secondary | ICD-10-CM

## 2017-05-15 DIAGNOSIS — M25511 Pain in right shoulder: Secondary | ICD-10-CM | POA: Insufficient documentation

## 2017-05-15 DIAGNOSIS — G8929 Other chronic pain: Secondary | ICD-10-CM | POA: Diagnosis not present

## 2017-05-15 DIAGNOSIS — R2681 Unsteadiness on feet: Secondary | ICD-10-CM

## 2017-05-15 NOTE — Therapy (Signed)
[place], [place], [address] Phone: [phone]   Fax:  [phone]  Physical Therapy Evaluation  Patient Details  Name: [name] MRN: [id number] Date of Birth: 1932/02/26 Referring Provider: Marton Redwood MD  Encounter Date: 05/15/2017      PT End of Session - 05/15/17 1114    Visit Number 1   Number of Visits 16   Date for PT Re-Evaluation 07/10/17   Authorization Type [employer]   Authorization Time Period 07-10-17   PT Start Time 1010   PT Stop Time 1105   PT Time Calculation (min) 55 min   Activity Tolerance Patient tolerated treatment well   Behavior During Therapy [facility] for tasks assessed/performed      Past Medical History:  Diagnosis Date  . Anxiety   . CAD (coronary artery disease)   . Cellulitis 10/2015   LEFT FOOT  . Complication of anesthesia   . Coronary artery disease   . Diabetes mellitus without complication (HCC)    insulin dependent  . GERD (gastroesophageal reflux disease)   . Hypertension   . Hypothyroidism   . Neuromuscular disorder (HCC)    muscle cramps to lower extremities  . Other primary cardiomyopathies   . PONV (postoperative nausea and vomiting)   . Shortness of breath     Past Surgical History:  Procedure Laterality Date  . ABDOMINAL HYSTERECTOMY    . CHOLECYSTECTOMY    . CORONARY ANGIOPLASTY WITH STENT PLACEMENT  08/09/2011   DES  to mid circumflex  . I&D EXTREMITY Left 10/29/2015   Procedure: Irrigation and Debridement Left Foot;  Surgeon: Newt Minion, MD;  Location: [place];  Service: Orthopedics;  Laterality: Left;  . LEFT HEART CATHETERIZATION WITH CORONARY ANGIOGRAM N/A 08/08/2012   Procedure: LEFT HEART CATHETERIZATION WITH CORONARY ANGIOGRAM;  Surgeon: [name], MD;  Location: [facility] CATH LAB;  Service: Cardiovascular;  Laterality: N/A;  . PERIPHERAL VASCULAR CATHETERIZATION N/A 12/30/2014   Procedure: Lower Extremity  Angiography;  Surgeon: Wellington Hampshire, MD;  Location: [place] CV LAB;  Service: Cardiovascular;  Laterality: N/A;  . THYROID SURGERY     radioactive iodine   . TONSILLECTOMY      There were no vitals filed for this visit.       Subjective Assessment - 05/15/17 1012    Subjective I used to walk 4 miles a day before stress fracture and havent exercises for 2 years. right arm pain began when I was carrying laundry from outside hanging on line. Pt has a husband with dementia which she takes care of.  When she carrried laundry in and pulled on door she felt a sharp pain in right shoulder and radiated down arm.   Pt has difficulty driving and lifting hand. Uses her left to guide right to steering wheel. Pt is able to lie on your right side at night   Pertinent History CAD, DM, PAD, stress fx and sprained ankle on the right, cervical disc bulges ( 3), Left foot infection with surgery,    Limitations Lifting;House hold activities;Other (comment)  driving    How long can you sit comfortably? unlimited   How long can you stand comfortably? unlimited   How long can you walk comfortably? unlimited   Diagnostic tests x ray   Patient Stated Goals I would like to be able to lift my arm to drive and lift onto steering wheel and lift above my  rightshoulder without using left  to assist to put clothes pins on line   Currently in Pain? Yes   Pain Score 5   0/10 at rest   Pain Location Shoulder   Pain Orientation Right   Pain Descriptors / Indicators Sharp;Stabbing   Pain Type Chronic pain   Pain Onset More than a month ago   Pain Frequency Intermittent   Aggravating Factors  driving.  laundry line and putting on pins, vacuuming.  cooking lifting pot to stove   Pain Relieving Factors nothing            [facility] PT Assessment - 05/15/17 0958      Assessment   Medical Diagnosis right shoulder pain / arthropathy   Referring Provider Marton Redwood MD   Onset Date/Surgical Date 01/09/17   Hand  Dominance Right   Next MD Visit in February 2019   Prior Therapy yes   over 20 years ago     Precautions   Precautions None   Precaution Comments allergic to sulfa drugs   Required Braces or Orthoses Other Brace/Splint  CAM boot on right for stress fx     Restrictions   Weight Bearing Restrictions No     Balance Screen   Has the patient fallen in the past 6 months Yes   How many times? 1  getting up from the commode no dizziness/slipped wet floor   Has the patient had a decrease in activity level because of a fear of falling?  Yes   Is the patient reluctant to leave their home because of a fear of falling?  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     Cognition   Overall Cognitive Status Within Functional Limits for tasks assessed     Observation/Other Assessments   Focus on Therapeutic Outcomes (FOTO)  FOTO Intake 51% limitation 49% predicted 40%     Posture/Postural Control   Posture/Postural Control Postural limitations     ROM / Strength   AROM / PROM / Strength AROM;PROM;Strength     AROM   Overall AROM  Deficits   Right Shoulder Extension 25 Degrees   Right Shoulder Flexion 55 Degrees   Right Shoulder ABduction 51 Degrees   Right Shoulder Internal Rotation 21 Degrees   Right Shoulder External Rotation 52 Degrees   Left Shoulder Extension 30 Degrees   Left Shoulder Flexion 149 Degrees   Left Shoulder ABduction 144 Degrees   Left Shoulder Internal Rotation 35 Degrees   Left Shoulder External Rotation 80 Degrees     PROM   Overall PROM  Deficits   Right Shoulder Flexion 140 Degrees   Right Shoulder ABduction 126 Degrees   Left  Shoulder Flexion 155 Degrees   Left Shoulder ABduction 147 Degrees     Strength   Overall Strength Deficits   Right Shoulder Flexion 3-/5   Right Shoulder Extension 4/5   Right Shoulder ABduction 3-/5   Right Shoulder Internal Rotation 3-/5   Right Shoulder External Rotation 2+/5   Left Shoulder Flexion 4/5   Left Shoulder Extension 4/5   Left Shoulder ABduction 4/5   Left Shoulder Internal Rotation 4/5  Left Shoulder External Rotation 4/5   Right/Left hand Right;Left   Right Hand Grip (lbs) 40, 40 38   Left Hand Grip (lbs) 38 , 36, 36     Palpation   Palpation comment Pt with very marked discomfort with palpation of subscapularis and periscapular mx . marked pain more on anterior superior shoulder and supscapular. and marked tightness of pectoral muscles.     Spurling's   Findings Negative   Comment right and left     Ambulation/Gait   Ambulation Distance (Feet) 150 Feet   Gait Pattern Antalgic   Ambulation Surface Level   Gait velocity 1.06 ft/sec   Gait Comments Pt in Cam walking boot with right stress fx            Objective measurements completed on examination: See above findings.          [place] Adult PT Treatment/Exercise - 05/15/17 0958      Self-Care   Self-Care Other Self-Care Comments;Posture   Other Self-Care Comments  sitting and standing posture intial.  possible use of cane for more normalized gait in boot     Shoulder Exercises: Supine   Other Supine Exercises supine red t band ER bil  10 x with towels in axilla.  manual resisitance for right ER for overflow     Shoulder Exercises: Stretch   Corner Stretch 3 reps;30 seconds   Corner Stretch Limitations pt right limited to 45 degrees abd for stretch on right.      Manual Therapy   Manual Therapy Soft tissue mobilization   Manual therapy comments resistive ER of right shoulder with PT assist   Soft tissue mobilization PROM to right shoulder with distraction and soft tissue to right pec and  subscapular and periscapular muscles                 PT Education - 05/15/17 1150    Education provided Yes   Education Details POC  Explanation of findings   posture/, pectoral stretching. care of arm at home   Person(s) Educated Patient   Methods Explanation;Demonstration;Tactile cues;Verbal cues;Handout   Comprehension Returned demonstration;Verbalized understanding          PT Short Term Goals - 05/15/17 1130      PT SHORT TERM GOAL #1   Title "Independent with initial HEP   Time 4   Period Weeks   Status New   Target Date 06/12/17     PT SHORT TERM GOAL #2   Title "Report pain decrease from   5 /10 to   3 /10.   Time 4   Period Weeks   Status New   Target Date 06/12/17     PT SHORT TERM GOAL #3   Title "Demonstrate understanding of proper sitting posture, body mechanics, work ergonomics, and be more conscious of position and posture throughout the day to not compress Right shoulder   Time 4   Period Weeks   Status New   Target Date 06/12/17     PT SHORT TERM GOAL #4   Title Tolerate light resistance exercises in flexion and scaption with minimal pain   Time 4   Period Weeks   Status New   Target Date 06/12/17           PT Long Term Goals - 05/15/17 1132      PT LONG TERM GOAL #1   Title "Pt will be independent with advanced HEP.    Time 8   Period Weeks  Status New   Target Date 07/10/17     PT LONG TERM GOAL #2   Title "Pain will decrease to =/< 2/10 with all functional activities   Time 8   Period Weeks   Status New   Target Date 07/10/17     PT LONG TERM GOAL #3   Title R shoulder IR and ER will return to [facility] to return to 2/10 or less pain level ADLs such as dressing and grooming.    Time 8   Period Weeks   Status New     PT LONG TERM GOAL #4   Title "FOTO will improve from  49% limtation  to  40% limtation   indicating improved functional mobility.    Time 8   Period Weeks   Status New     PT LONG TERM GOAL #5   Title "R  shoulder AROM scaption will improve to 0-120 degrees for improved overhead reaching in order to continue using clothes line for laundry   Time 8   Period Weeks   Status New   Target Date 07/10/17     PT LONG TERM GOAL #6   Title Pt will be able to lift right shoulder independently onto steering wheel in order to drive more safely without assistance from left hand   Time 8   Period Weeks   Status New   Target Date 07/10/17                Plan - May 23, 2017 1117    Clinical Impression Statement 83 yo with right shoulder arthropathy since 01-09-17 when she injured arm bringing in laundry in and trying to open back door. Pt has been having increasing limited motion and pain in right arm and seek PT to try to eliminate pain and decreased strength and ROM.Marland Kitchen  Pt is unable to perform household chores, or lift right shoulder  above  55 degrees. due to muscular weakness and pain and abnormal posture and stiffness.. Pt is also havingdifficult driving and putting right had on steering wheel without assistance from left hand. Pt also expresses fearfulness about falling due to peripheral neuropathy and current right stress fx.  Pt will benefit from  skilled PT for 2 x a week for 8 weeks.   History and Personal Factors relevant to plan of care: DM, stress fx of right foot in boot, PVD, CAD, HTN. left DM foot ulcer. right shoulder arthopathy, Peripheral neurapathy, renal dz, stage 2   Clinical Presentation Evolving   Clinical Decision Making Moderate   Rehab Potential Good   PT Frequency 2x / week   PT Duration 8 weeks   PT Treatment/Interventions ADLs/Self Care Home Management;Cryotherapy;Electrical Stimulation;Iontophoresis 4mg /ml Dexamethasone;Moist Heat;Ultrasound;Dry needling;Taping;Passive range of motion;Manual techniques;Therapeutic activities;Therapeutic exercise;Neuromuscular re-education;Patient/family education   PT Next Visit Plan Manual, possible TPDN,  supine cane exercises   PT Home  Exercise Plan corner pectoral stretch ,  supine T band ER   Consulted and Agree with Plan of Care Patient      Patient will benefit from skilled therapeutic intervention in order to improve the following deficits and impairments:  Pain, Improper body mechanics, Postural dysfunction, Impaired UE functional use, Increased muscle spasms, Impaired flexibility, Decreased strength, Decreased range of motion, Difficulty walking  Visit Diagnosis: Chronic right shoulder pain  Unsteadiness on feet  Stiffness of right shoulder, not elsewhere classified  Abnormal posture      G-Codes - 2017-05-23 1146    Functional Assessment Tool Used (Outpatient Only) FOTO  Functional Limitation Carrying, moving and handling objects  49%   Carrying, Moving and Handling Objects Current Status [phone]) At least 40 percent but less than 60 percent impaired, limited or restricted   Carrying, Moving and Handling Objects Goal Status ([id number]) At least 20 percent but less than 40 percent impaired, limited or restricted  40%       Problem List Patient Active Problem List   Diagnosis Date Noted  . Achilles tendon contracture, left 07/24/2016  . Ulcer of left foot, limited to breakdown of skin ([place]) 06/01/2016  . Cellulitis 10/27/2015  . Peripheral arterial disease ([place]) 10/20/2015  . Severe peripheral arterial disease ([place]) 12/28/2014  . Chronic diastolic heart failure ([place]) 12/30/2013  . Edema 12/30/2013  . Mixed hyperlipidemia 06/24/2013  . Mitral valve disorders(424.0) 06/24/2013  . Coronary artery disease   . Hypertension   . Diabetes mellitus without complication ([place])   . Other primary cardiomyopathies     Voncille Lo, PT Certified Exercise Expert for the Aging Adult  05/15/17 11:51 AM Phone: [phone] Fax: [place] [facility] [address] [place], [place], [address] Phone: [phone]   Fax:  [phone]  Name: LOANN CHAHAL MRN: [id number] Date of Birth: [birth year]

## 2017-05-15 NOTE — Patient Instructions (Signed)
Posture Tips DO: - stand tall and erect - keep chin tucked in - keep head and shoulders in alignment - check posture regularly in mirror or large window - pull head back against headrest in car seat;  Change your position often.  Sit with lumbar support. DON'T: - slouch or slump while watching TV or reading - sit, stand or lie in one position  for too long;  Sitting is especially hard on the spine so if you sit at a desk/use the computer, then stand up often!   Copyright  VHI. All rights reserved.  Posture - Standing   Good posture is important. Avoid slouching and forward head thrust. Maintain curve in low back and align ears over shoul- ders, hips over ankles.  Pull your belly button in toward your back bone. Stand with even weight over heel and toe, soft knees.  Ribs lifted up like a golden thread to the sky.  Chin down.   Copyright  VHI. All rights reserved.  Posture - Sitting   Sit upright, head facing forward. Try using a roll to support lower back. Keep shoulders relaxed, and avoid rounded back. Keep hips level with knees. Avoid crossing legs for long periods.. Sit on Sit bones and not tail bone.  Do not perch on edge of seat  And do not cross legs. Try to sit in a kitchen chair.   Flexibility: Corner Stretch    Standing in corner with hands just above shoulder level and feet __6__ inches from corner, lean forward until a comfortable stretch is felt across chest. Hold _30-60___ seconds. Repeat __3__ times per set. Do ___3_ sets per session. Do ____ sessions per day.  http://orth.exer.us/343   Copyright  VHI. All rights reserved.          Shoulder Rotation: Double Arm   On back, knees bent, feet flat, elbows tucked at sides, bent 90, hands palms up. Pull hands apart and down toward floor, keeping elbows near sides. Hold momentarily. Slowly return to starting position. Repeat _15__ times. X 2   Band color ___red___   1-2 times a day  [name], PT Certified  Exercise Expert for the Aging Adult  05/15/17 11:12 AM Phone: [phone] Fax: [phone]

## 2017-05-22 ENCOUNTER — Ambulatory Visit: Payer: PPO | Attending: Internal Medicine | Admitting: Physical Therapy

## 2017-05-22 ENCOUNTER — Encounter: Payer: Self-pay | Admitting: Physical Therapy

## 2017-05-22 DIAGNOSIS — R2681 Unsteadiness on feet: Secondary | ICD-10-CM | POA: Insufficient documentation

## 2017-05-22 DIAGNOSIS — M25511 Pain in right shoulder: Secondary | ICD-10-CM | POA: Diagnosis not present

## 2017-05-22 DIAGNOSIS — R293 Abnormal posture: Secondary | ICD-10-CM | POA: Insufficient documentation

## 2017-05-22 DIAGNOSIS — G8929 Other chronic pain: Secondary | ICD-10-CM | POA: Diagnosis not present

## 2017-05-22 DIAGNOSIS — M25611 Stiffness of right shoulder, not elsewhere classified: Secondary | ICD-10-CM | POA: Diagnosis not present

## 2017-05-22 NOTE — Patient Instructions (Signed)
From ex drawer,  Single arm ER stretch 1 x a day 3 X each Hold 20-30 seconds,   Pain free

## 2017-05-22 NOTE — Therapy (Signed)
[place], [place], [address] Phone: [phone]   Fax:  [phone]  Physical Therapy Treatment  Patient Details  Name: [name] MRN: [id number] Date of Birth: April 19, 1932 Referring Provider: Marton Redwood MD   Encounter Date: 05/22/2017  PT End of Session - 05/22/17 1743    Visit Number  2    Number of Visits  16    Date for PT Re-Evaluation  07/10/17    PT Start Time  [id number]    PT Stop Time  1633    PT Time Calculation (min)  46 min    Activity Tolerance  Patient tolerated treatment well    Behavior During Therapy  [facility] for tasks assessed/performed       Past Medical History:  Diagnosis Date  . Anxiety   . CAD (coronary artery disease)   . Cellulitis 10/2015   LEFT FOOT  . Complication of anesthesia   . Coronary artery disease   . Diabetes mellitus without complication (HCC)    insulin dependent  . GERD (gastroesophageal reflux disease)   . Hypertension   . Hypothyroidism   . Neuromuscular disorder (HCC)    muscle cramps to lower extremities  . Other primary cardiomyopathies   . PONV (postoperative nausea and vomiting)   . Shortness of breath     Past Surgical History:  Procedure Laterality Date  . ABDOMINAL HYSTERECTOMY    . CHOLECYSTECTOMY    . CORONARY ANGIOPLASTY WITH STENT PLACEMENT  08/09/2011   DES  to mid circumflex  . THYROID SURGERY     radioactive iodine   . TONSILLECTOMY      There were no vitals filed for this visit.  Subjective Assessment - 05/22/17 1735    Subjective  I have done the exercises 1 day each ,  then i stopped.  i was not motivated.  My husband has dementia and I sometimes need to rest.     Currently in Pain?  Yes    Pain Score  5     Pain Location  Shoulder    Pain Orientation  Right    Pain Descriptors / Indicators  Sharp;Stabbing    Pain Type  Chronic pain    Pain Frequency  Intermittent    Aggravating Factors   using arm,  driving,  laundry  reaching cooking lifting    Pain Relieving Factors  not using it ,  nothing                      [facility] Adult PT Treatment/Exercise - 05/22/17 0001      Shoulder Exercises: Supine   Other Supine Exercises  tried,  did not use towel so was unsuccessful.  decreased band to yellow and had her try to do sitting.         Shoulder Exercises: Isometric Strengthening   Flexion  5X5"    Extension  5X5"    ABduction  5X5" heavy cues,  lumbar support for improved sitting prior to ex   heavy cues,  lumbar support for improved sitting prior to ex     Shoulder Exercises: Stretch   Corner Stretch Limitations  corner stretch painful,  modified for single arm stretch at doorway.  Able to do without pain increased.       Manual Therapy   Manual Therapy  Soft tissue mobilization    Soft tissue mobilization  soft tissue work right shoulder ,  gentle ROM,  tissue  sodtened.  Multiple stiff tende areas.               PT Education - 05/22/17 1742    Education provided  Yes    Education Details  HEP,  sitting posture    Person(s) Educated  Patient    Methods  Explanation;Demonstration;Verbal cues;Handout    Comprehension  Verbalized understanding;Returned demonstration       PT Short Term Goals - 05/15/17 1130      PT SHORT TERM GOAL #1   Title  "Independent with initial HEP    Time  4    Period  Weeks    Status  New    Target Date  06/12/17      PT SHORT TERM GOAL #2   Title  "Report pain decrease from   5 /10 to   3 /10.    Time  4    Period  Weeks    Status  New    Target Date  06/12/17      PT SHORT TERM GOAL #3   Title  "Demonstrate understanding of proper sitting posture, body mechanics, work ergonomics, and be more conscious of position and posture throughout the day to not compress Right shoulder    Time  4    Period  Weeks    Status  New    Target Date  06/12/17      PT SHORT TERM GOAL #4   Title  Tolerate light resistance exercises in flexion and scaption  with minimal pain    Time  4    Period  Weeks    Status  New    Target Date  06/12/17        PT Long Term Goals - 05/15/17 1132      PT LONG TERM GOAL #1   Title  "Pt will be independent with advanced HEP.     Time  8    Period  Weeks    Status  New    Target Date  07/10/17      PT LONG TERM GOAL #2   Title  "Pain will decrease to =/< 2/10 with all functional activities    Time  8    Period  Weeks    Status  New    Target Date  07/10/17      PT LONG TERM GOAL #3   Title  R shoulder IR and ER will return to [facility] to return to 2/10 or less pain level ADLs such as dressing and grooming.     Time  8    Period  Weeks    Status  New      PT LONG TERM GOAL #4   Title  "FOTO will improve from  49% limtation  to  40% limtation   indicating improved functional mobility.     Time  8    Period  Weeks    Status  New      PT LONG TERM GOAL #5   Title  "R shoulder AROM scaption will improve to 0-120 degrees for improved overhead reaching in order to continue using clothes line for laundry    Time  8    Period  Weeks    Status  New    Target Date  07/10/17      PT LONG TERM GOAL #6   Title  Pt will be able to lift right shoulder independently onto steering wheel in order to drive more safely without assistance  from left hand    Time  8    Period  Weeks    Status  New    Target Date  07/10/17            Plan - 05/22/17 1744    Clinical Impression Statement  Patient not consistantly doing her HEP.  She is overwhelmed with her homelife and a husband with dementia.  Modified exercises so she may be more compliant.  Able to improve sitting posture.  ROM not yet improved.    PT Next Visit Plan  Manual, possible TPDN,  supine cane exercises    PT Home Exercise Plan  Doorway pectoral stretch ,  supine T band ER    Consulted and Agree with Plan of Care  Patient       Patient will benefit from skilled therapeutic intervention in order to improve the following deficits and  impairments:     Visit Diagnosis: Chronic right shoulder pain  Unsteadiness on feet  Abnormal posture  Stiffness of right shoulder, not elsewhere classified     Problem List Patient Active Problem List   Diagnosis Date Noted  . Achilles tendon contracture, left 07/24/2016  . Ulcer of left foot, limited to breakdown of skin ([place]) 06/01/2016  . Cellulitis 10/27/2015  . Peripheral arterial disease ([place]) 10/20/2015  . Severe peripheral arterial disease ([place]) 12/28/2014  . Chronic diastolic heart failure ([place]) 12/30/2013  . Edema 12/30/2013  . Mixed hyperlipidemia 06/24/2013  . Mitral valve disorders(424.0) 06/24/2013  . Coronary artery disease   . Hypertension   . Diabetes mellitus without complication ([place])   . Other primary cardiomyopathies     [name] PTA 05/22/2017, 5:47 PM  [facility] [address] [place], [place], [address] Phone: [phone]   Fax:  [phone]  Name: [name] MRN: [id number] Date of Birth: 1932/01/12

## 2017-05-24 ENCOUNTER — Ambulatory Visit: Payer: PPO | Admitting: Podiatry

## 2017-05-24 ENCOUNTER — Ambulatory Visit (INDEPENDENT_AMBULATORY_CARE_PROVIDER_SITE_OTHER): Payer: PPO

## 2017-05-24 ENCOUNTER — Ambulatory Visit: Payer: PPO | Admitting: Physical Therapy

## 2017-05-24 ENCOUNTER — Encounter: Payer: Self-pay | Admitting: Physical Therapy

## 2017-05-24 DIAGNOSIS — M84374A Stress fracture, right foot, initial encounter for fracture: Secondary | ICD-10-CM | POA: Diagnosis not present

## 2017-05-24 DIAGNOSIS — L84 Corns and callosities: Secondary | ICD-10-CM | POA: Diagnosis not present

## 2017-05-24 DIAGNOSIS — M25511 Pain in right shoulder: Secondary | ICD-10-CM | POA: Diagnosis not present

## 2017-05-24 DIAGNOSIS — R293 Abnormal posture: Secondary | ICD-10-CM

## 2017-05-24 DIAGNOSIS — B351 Tinea unguium: Secondary | ICD-10-CM

## 2017-05-24 DIAGNOSIS — M25611 Stiffness of right shoulder, not elsewhere classified: Secondary | ICD-10-CM

## 2017-05-24 DIAGNOSIS — E1159 Type 2 diabetes mellitus with other circulatory complications: Secondary | ICD-10-CM | POA: Diagnosis not present

## 2017-05-24 DIAGNOSIS — M79676 Pain in unspecified toe(s): Secondary | ICD-10-CM

## 2017-05-24 DIAGNOSIS — G8929 Other chronic pain: Secondary | ICD-10-CM

## 2017-05-24 DIAGNOSIS — R2681 Unsteadiness on feet: Secondary | ICD-10-CM

## 2017-05-24 NOTE — Therapy (Signed)
[place], [place], [address] Phone: [phone]   Fax:  [phone]  Physical Therapy Treatment  Patient Details  Name: [name] MRN: [id number] Date of Birth: 05-04-1932 Referring Provider: Marton Redwood MD   Encounter Date: 05/24/2017  PT End of Session - 05/24/17 1718    Visit Number  3    Number of Visits  16    Date for PT Re-Evaluation  07/10/17    Authorization Type  [employer]    Authorization Time Period  07-10-17    PT Start Time  1500    PT Stop Time  1554    PT Time Calculation (min)  54 min    Activity Tolerance  Patient tolerated treatment well    Behavior During Therapy  [facility] for tasks assessed/performed       Past Medical History:  Diagnosis Date  . Anxiety   . CAD (coronary artery disease)   . Cellulitis 10/2015   LEFT FOOT  . Complication of anesthesia   . Coronary artery disease   . Diabetes mellitus without complication (HCC)    insulin dependent  . GERD (gastroesophageal reflux disease)   . Hypertension   . Hypothyroidism   . Neuromuscular disorder (HCC)    muscle cramps to lower extremities  . Other primary cardiomyopathies   . PONV (postoperative nausea and vomiting)   . Shortness of breath     Past Surgical History:  Procedure Laterality Date  . ABDOMINAL HYSTERECTOMY    . CHOLECYSTECTOMY    . CORONARY ANGIOPLASTY WITH STENT PLACEMENT  08/09/2011   DES  to mid circumflex  . THYROID SURGERY     radioactive iodine   . TONSILLECTOMY      There were no vitals filed for this visit.  Subjective Assessment - 05/24/17 1505    Subjective  I have  been doing some the experiencing.  I was under the impression I should only work every other day and have rest.  My pain was higher yesterday but I am doin better today.    Pertinent History  CAD, DM, PAD, stress fx and sprained ankle on the right, cervical disc bulges ( 3), Left foot infection with surgery,      Limitations  Lifting;House hold activities;Other (comment)    Patient Stated Goals  I would like to be able to lift my arm to drive and lift onto steering wheel and lift above my  rightshoulder without using left to assist to put clothes pins on line    Currently in Pain?  Yes    Pain Score  3  0/10    Pain Location  Shoulder    Pain Orientation  Right    Pain Descriptors / Indicators  Sharp    Pain Type  Chronic pain    Pain Onset  More than a month ago    Pain Frequency  Intermittent                      [facility] Adult PT Treatment/Exercise - 05/24/17 1514      Self-Care   Self-Care  Other Self-Care Comments    Other Self-Care Comments   education on TPDN with after care and education      Shoulder Exercises: Supine   Other Supine Exercises  supine cane exercises flexion bil x 10,  abd and ER each x 10      Moist Heat Therapy   Number Minutes  Moist Heat  12 Minutes    Moist Heat Location  Cervical;Shoulder R shoulder      Ultrasound   Ultrasound Location  right subscap and right pec minor    Ultrasound Parameters  1.5 w / cm2  100%    Ultrasound Goals  Pain      Manual Therapy   Manual Therapy  Soft tissue mobilization    Soft tissue mobilization  soft tissue work right shoulder periscapular area ,  gentle ROM,  upper trap        Trigger Point Dry Needling - 05/24/17 1544    Consent Given?  Yes    Education Handout Provided  Yes    Muscles Treated Upper Body  Upper trapezius;Subscapularis;Pectoralis minor;Pectoralis major right only    Upper Trapezius Response  Twitch reponse elicited;Palpable increased muscle length    Pectoralis Major Response  Palpable increased muscle length    Pectoralis Minor Response  Twitch response elicited;Palpable increased muscle length    Subscapularis Response  Twitch response elicited;Palpable increased muscle length lateral subscapularis right           PT Education - 05/24/17 1716    Education provided  Yes     Education Details  added supine cane exercises and education on TPDN after care and precautians    Person(s) Educated  Patient    Methods  Explanation;Demonstration;Verbal cues;Tactile cues;Handout    Comprehension  Verbalized understanding;Returned demonstration       PT Short Term Goals - 05/15/17 1130      PT SHORT TERM GOAL #1   Title  "Independent with initial HEP    Time  4    Period  Weeks    Status  New    Target Date  06/12/17      PT SHORT TERM GOAL #2   Title  "Report pain decrease from   5 /10 to   3 /10.    Time  4    Period  Weeks    Status  New    Target Date  06/12/17      PT SHORT TERM GOAL #3   Title  "Demonstrate understanding of proper sitting posture, body mechanics, work ergonomics, and be more conscious of position and posture throughout the day to not compress Right shoulder    Time  4    Period  Weeks    Status  New    Target Date  06/12/17      PT SHORT TERM GOAL #4   Title  Tolerate light resistance exercises in flexion and scaption with minimal pain    Time  4    Period  Weeks    Status  New    Target Date  06/12/17        PT Long Term Goals - 05/15/17 1132      PT LONG TERM GOAL #1   Title  "Pt will be independent with advanced HEP.     Time  8    Period  Weeks    Status  New    Target Date  07/10/17      PT LONG TERM GOAL #2   Title  "Pain will decrease to =/< 2/10 with all functional activities    Time  8    Period  Weeks    Status  New    Target Date  07/10/17      PT LONG TERM GOAL #3   Title  R shoulder IR and ER will return  to [facility] to return to 2/10 or less pain level ADLs such as dressing and grooming.     Time  8    Period  Weeks    Status  New      PT LONG TERM GOAL #4   Title  "FOTO will improve from  49% limtation  to  40% limtation   indicating improved functional mobility.     Time  8    Period  Weeks    Status  New      PT LONG TERM GOAL #5   Title  "R shoulder AROM scaption will improve to 0-120 degrees  for improved overhead reaching in order to continue using clothes line for laundry    Time  8    Period  Weeks    Status  New    Target Date  07/10/17      PT LONG TERM GOAL #6   Title  Pt will be able to lift right shoulder independently onto steering wheel in order to drive more safely without assistance from left hand    Time  8    Period  Weeks    Status  New    Target Date  07/10/17            Plan - 05/24/17 1719    Clinical Impression Statement  Pt trying to perform exercises more consistently but did not do exercises yesterday.  Attended PT on Tuesday and skipped WEdnesday.  Emphasis on importance of exericise and strenghtening.  Pt consented to TPDN and had significant twitch response of sub scapularis on right . Pt was closely monitored throughtout session.  Pt  had decreased muscle tension post RX in subscapularis, pec minor and upper trap on right side only.    Rehab Potential  Good    PT Frequency  2x / week    PT Duration  8 weeks    PT Treatment/Interventions  ADLs/Self Care Home Management;Cryotherapy;Electrical Stimulation;Iontophoresis 4mg /ml Dexamethasone;Moist Heat;Ultrasound;Dry needling;Taping;Passive range of motion;Manual techniques;Therapeutic activities;Therapeutic exercise;Neuromuscular re-education;Patient/family education    PT Next Visit Plan  Manual, assess  TPDN,  supine cane exercises review.  Emphasize isometric at various ranges in supine more than cane,  and strengthening of right shoulder    PT Home Exercise Plan  Doorway pectoral stretch ,  supine T band ER supine cane    Consulted and Agree with Plan of Care  Patient       Patient will benefit from skilled therapeutic intervention in order to improve the following deficits and impairments:  Pain, Improper body mechanics, Postural dysfunction, Impaired UE functional use, Increased muscle spasms, Impaired flexibility, Decreased strength, Decreased range of motion, Difficulty walking  Visit  Diagnosis: Chronic right shoulder pain  Unsteadiness on feet  Abnormal posture  Stiffness of right shoulder, not elsewhere classified     Problem List Patient Active Problem List   Diagnosis Date Noted  . Achilles tendon contracture, left 07/24/2016  . Ulcer of left foot, limited to breakdown of skin ([place]) 06/01/2016  . Cellulitis 10/27/2015  . Peripheral arterial disease ([place]) 10/20/2015  . Severe peripheral arterial disease ([place]) 12/28/2014  . Chronic diastolic heart failure ([place]) 12/30/2013  . Edema 12/30/2013  . Mixed hyperlipidemia 06/24/2013  . Mitral valve disorders(424.0) 06/24/2013  . Coronary artery disease   . Hypertension   . Diabetes mellitus without complication ([place])   . Other primary cardiomyopathies     Voncille Lo, PT Certified Exercise Expert for the Aging Adult  05/24/17  5:27 PM Phone: [phone] Fax: [facility] [address] [place], [place], [address] Phone: [phone]   Fax:  [phone]  Name: [name] MRN: [id number] Date of Birth: [birth year]

## 2017-05-24 NOTE — Patient Instructions (Addendum)
SHOULDER: External Rotation - Supine (Cane)   Hold cane with both hands. Rotate arm away from body. Keep elbow on floor and next to body. _10__ reps per set, _1-2__ sets per day, _7__ days per week Add towel to keep elbow at side.  Copyright  VHI. All rights reserved.  Cane Horizontal - Supine   With straight arms holding cane above shoulders, bring cane out to right, center, out to left, and back to above head. Repeat _10__ times. Do _2__ times per day.  Copyright  VHI. All rights reserved.  Cane Exercise: Flexion   Lie on back, holding cane above chest. Keeping arms as straight as possible, lower cane toward floor beyond head. Hold _3-5___ seconds. Repeat 10____ times. Do __1-2__ sessions per day.  http://gt2.exer.us/91   Copyright  VHI. All rights reserved.   Trigger Point Dry Needling  . What is Trigger Point Dry Needling (DN)? o DN is a physical therapy technique used to treat muscle pain and dysfunction. Specifically, DN helps deactivate muscle trigger points (muscle knots).  o A thin filiform needle is used to penetrate the skin and stimulate the underlying trigger point. The goal is for a local twitch response (LTR) to occur and for the trigger point to relax. No medication of any kind is injected during the procedure.   . What Does Trigger Point Dry Needling Feel Like?  o The procedure feels different for each individual patient. Some patients report that they do not actually feel the needle enter the skin and overall the process is not painful. Very mild bleeding may occur. However, many patients feel a deep cramping in the muscle in which the needle was inserted. This is the local twitch response.   Marland Kitchen How Will I feel after the treatment? o Soreness is normal, and the onset of soreness may not occur for a few hours. Typically this soreness does not last longer than two days.  o Bruising is uncommon, however; ice can be used to decrease any possible bruising.  o In  rare cases feeling tired or nauseous after the treatment is normal. In addition, your symptoms may get worse before they get better, this period will typically not last longer than 24 hours.   . What Can I do After My Treatment? o Increase your hydration by drinking more water for the next 24 hours. o You may place ice or heat on the areas treated that have become sore, however, do not use heat on inflamed or bruised areas. Heat often brings more relief post needling. o You can continue your regular activities, but vigorous activity is not recommended initially after the treatment for 24 hours. o DN is best combined with other physical therapy such as strengthening, stretching, and other therapies.    [name], PT Certified Exercise Expert for the Aging Adult  05/24/17 3:10 PM Phone: [phone] Fax: [phone]

## 2017-05-27 NOTE — Progress Notes (Signed)
Subjective: Ms. Zuckerman presents the office today for follow-up evaluation of pain to her right foot as well as stress fracture.  She states that overall she is doing better.  She does wear the boot the majority of times but she does wear a regular shoe which she states that when she wears a regular shoe she has no pain.  Also her swelling has almost resolved.  She denies any recent injury or trauma she denies any redness or warmth.  Also while she is here today she states her nails are thick, painful, elongated and she cannot trim them herself she also has a corn of the left fourth toe that she likes to hunt.  She denies any swelling or drainage coming from the area as well.  She has no other concerns. Denies any systemic complaints such as fevers, chills, nausea, vomiting. No acute changes since last appointment, and no other complaints at this time.   Objective: AAO x3, NAD DP/PT pulses palpable 1/4, CRT less than 3 seconds- no claudication symptoms At this time there is no tenderness palpation to the fifth metatarsal and there is no other areas of pinpoint tenderness.  There is no significant edema, erythema, increase in warmth to the extremities.  There is no open lesions identified.  Her symptoms of the right foot is resolved. Nails are hypertrophic, dystrophic, brittle, discolored, elongated 10. No surrounding redness or drainage. Tenderness nails 1-5 bilaterally.  Small hyperkeratotic lesion to the dorsal left fourth PIPJ.  No underlying ulceration, drainage or any clinical signs of infection. No open lesions or pre-ulcerative lesions.  No pain with calf compression, swelling, warmth, erythema  Assessment: Resolved right foot pain, stress fracture; symptomatic onychomycosis with hyperkeratotic lesion  Plan: -All treatment options discussed with the patient including all alternatives, risks, complications.  -X-rays were obtained and reviewed.  There is no evidence of acute fracture or stress  fracture identified today.  Her right foot pain is resolved.  She can start to transition to regular shoe full-time.  There is any recurrence of pain to call the office or if there is any recurrence of swelling. -Nail sharply debrided x10 without any complications or bleeding -Hyperkeratotic lesion sharply debrided x1 without any complications or bleeding -Patient encouraged to call the office with any questions, concerns, change in symptoms.   Trula Slade DPM

## 2017-05-29 ENCOUNTER — Ambulatory Visit: Payer: PPO | Admitting: Physical Therapy

## 2017-05-29 ENCOUNTER — Encounter: Payer: Self-pay | Admitting: Physical Therapy

## 2017-05-29 DIAGNOSIS — M25511 Pain in right shoulder: Principal | ICD-10-CM

## 2017-05-29 DIAGNOSIS — M25611 Stiffness of right shoulder, not elsewhere classified: Secondary | ICD-10-CM

## 2017-05-29 DIAGNOSIS — R2681 Unsteadiness on feet: Secondary | ICD-10-CM

## 2017-05-29 DIAGNOSIS — G8929 Other chronic pain: Secondary | ICD-10-CM

## 2017-05-29 DIAGNOSIS — R293 Abnormal posture: Secondary | ICD-10-CM

## 2017-05-29 NOTE — Therapy (Signed)
[place], [place], [address] Phone: [phone]   Fax:  [phone]  Physical Therapy Treatment  Patient Details  Name: [name] MRN: [id number] Date of Birth: 11-28-1931 Referring Provider: Marton Redwood MD   Encounter Date: 05/29/2017  PT End of Session - 05/29/17 1511    Visit Number  4    Number of Visits  16    Date for PT Re-Evaluation  07/10/17    PT Start Time  8850    PT Stop Time  1500    PT Time Calculation (min)  45 min    Activity Tolerance  Patient tolerated treatment well    Behavior During Therapy  [facility] for tasks assessed/performed       Past Medical History:  Diagnosis Date  . Anxiety   . CAD (coronary artery disease)   . Cellulitis 10/2015   LEFT FOOT  . Complication of anesthesia   . Coronary artery disease   . Diabetes mellitus without complication (HCC)    insulin dependent  . GERD (gastroesophageal reflux disease)   . Hypertension   . Hypothyroidism   . Neuromuscular disorder (HCC)    muscle cramps to lower extremities  . Other primary cardiomyopathies   . PONV (postoperative nausea and vomiting)   . Shortness of breath     Past Surgical History:  Procedure Laterality Date  . ABDOMINAL HYSTERECTOMY    . CHOLECYSTECTOMY    . CORONARY ANGIOPLASTY WITH STENT PLACEMENT  08/09/2011   DES  to mid circumflex  . THYROID SURGERY     radioactive iodine   . TONSILLECTOMY      There were no vitals filed for this visit.  Subjective Assessment - 05/29/17 1422    Subjective  Pain OK now Woke up Sunday night in 8-9/10   she was sleeping on it.  No change with DN. She was fine sleeping on shoulder last night.  Thinks she may have pulled something getting up from kneeling in church.      Currently in Pain?  No/denies    Pain Score  -- up to 8/10    Pain Orientation  Right    Pain Frequency  Intermittent    Aggravating Factors   driving,  cooking    Pain Relieving  Factors  heating pad                      [facility] Adult PT Treatment/Exercise - 05/29/17 0001      Ambulation/Gait   Gait Comments  boot off      Shoulder Exercises: Supine   Other Supine Exercises  RE was only cane ex able to do, placed these on hold all attempted,  moderate cues and pain.      Shoulder Exercises: Seated   Retraction  5 reps    Retraction Limitations  while discussing posture.    Other Seated Exercises  isometric abdominals both and single.  10 X each      Shoulder Exercises: Pulleys   Flexion Limitations  clunked so 3 reps only      Shoulder Exercises: Isometric Strengthening   Flexion  5X5"    Extension  5X5"    External Rotation  5X5"    Internal Rotation  5X5"    ABduction  5X5" All added to HEP      Moist Heat Therapy   Number Minutes Moist Heat  -- minutes concurrent with sitting exercise  Moist Heat Location  Shoulder      Manual Therapy   Manual Therapy  Soft tissue mobilization    Soft tissue mobilization  is sitting hard to get tissue to relaax,  better in supine,               PT Education - 05/29/17 1511    Education provided  Yes    Education Details  HEP.  cane exercises on hold.     Person(s) Educated  Patient    Methods  Explanation;Demonstration;Verbal cues;Tactile cues;Handout    Comprehension  Verbalized understanding;Returned demonstration;Need further instruction       PT Short Term Goals - 05/29/17 1803      PT SHORT TERM GOAL #1   Title  "Independent with initial HEP    Baseline  cues,  nompliant    Time  4    Period  Weeks    Status  On-going      PT SHORT TERM GOAL #2   Title  "Report pain decrease from   5 /10 to   3 /10.    Baseline  varies    Time  4    Period  Weeks    Status  On-going      PT SHORT TERM GOAL #3   Title  "Demonstrate understanding of proper sitting posture, body mechanics, work ergonomics, and be more conscious of position and posture throughout the day to not compress  Right shoulder    Baseline  continue ed,  practice.    Time  4    Period  Weeks    Status  On-going      PT SHORT TERM GOAL #4   Title  Tolerate light resistance exercises in flexion and scaption with minimal pain    Baseline  more than minimal pain    Time  4    Period  Weeks    Status  On-going        PT Long Term Goals - 05/15/17 1132      PT LONG TERM GOAL #1   Title  "Pt will be independent with advanced HEP.     Time  8    Period  Weeks    Status  New    Target Date  07/10/17      PT LONG TERM GOAL #2   Title  "Pain will decrease to =/< 2/10 with all functional activities    Time  8    Period  Weeks    Status  New    Target Date  07/10/17      PT LONG TERM GOAL #3   Title  R shoulder IR and ER will return to [facility] to return to 2/10 or less pain level ADLs such as dressing and grooming.     Time  8    Period  Weeks    Status  New      PT LONG TERM GOAL #4   Title  "FOTO will improve from  49% limtation  to  40% limtation   indicating improved functional mobility.     Time  8    Period  Weeks    Status  New      PT LONG TERM GOAL #5   Title  "R shoulder AROM scaption will improve to 0-120 degrees for improved overhead reaching in order to continue using clothes line for laundry    Time  8    Period  Weeks    Status  New    Target Date  07/10/17      PT LONG TERM GOAL #6   Title  Pt will be able to lift right shoulder independently onto steering wheel in order to drive more safely without assistance from left hand    Time  8    Period  Weeks    Status  New    Target Date  07/10/17            Plan - 05/29/17 1512    Clinical Impression Statement  Cane exercises not tolerated.  Able to tolerate isometrice so added to HEP,  cane on hold.  Large click noted with flexion on pulleys. so stopped. Progress toward HEP goal.  Patient is non compliant.    PT Next Visit Plan  Manual, assess  TPDN,  isometric exercises review.  Emphasize isometric at various  ranges in supine more than cane,  and strengthening of right shoulder    PT Home Exercise Plan  Doorway pectoral stretch ,  supine T band ER supine cane( on hold)  SHOULDER ISOMETRIC    Consulted and Agree with Plan of Care  Patient       Patient will benefit from skilled therapeutic intervention in order to improve the following deficits and impairments:     Visit Diagnosis: Chronic right shoulder pain  Unsteadiness on feet  Abnormal posture  Stiffness of right shoulder, not elsewhere classified     Problem List Patient Active Problem List   Diagnosis Date Noted  . Achilles tendon contracture, left 07/24/2016  . Ulcer of left foot, limited to breakdown of skin ([place]) 06/01/2016  . Cellulitis 10/27/2015  . Peripheral arterial disease ([place]) 10/20/2015  . Severe peripheral arterial disease ([place]) 12/28/2014  . Chronic diastolic heart failure ([place]) 12/30/2013  . Edema 12/30/2013  . Mixed hyperlipidemia 06/24/2013  . Mitral valve disorders(424.0) 06/24/2013  . Coronary artery disease   . Hypertension   . Diabetes mellitus without complication ([place])   . Other primary cardiomyopathies     Jamelyn Bovard  PTA 05/29/2017, 6:05 PM  [facility] [address] [place], [place], [address] Phone: [phone]   Fax:  [phone]  Name: PRISHA HILEY MRN: [id number] Date of Birth: [birth year]

## 2017-05-29 NOTE — Patient Instructions (Signed)
Strengthening: Isometric Flexion  Using wall for resistance, press right fist into ball using light pressure. Hold _5___ seconds. Repeat __5-10__ times per set. Do ___1_ sessions per day.  SHOULDER: Abduction (Isometric)  Use wall as resistance. Press arm against pillow. Keep elbow straight. Hold _5__ seconds. _5-10__ reps per set.  Extension (Isometric)  Place left bent elbow and back of arm against wall. Press elbow against wall. Hold _5_ seconds. Repeat ___5-10_ times. Do _1___ sessions per day.  Internal Rotation (Isometric)  Place palm of right fist against door frame, with elbow bent. Press fist against door frame. Hold ___5_ seconds. Repeat _5-10___ times. Do __1__ sessions per day.  External Rotation (Isometric)  Place back of left fist against door frame, with elbow bent. Press fist against door frame. Hold _5___ seconds. Repeat ___5-10_ times. Do __1__ sessions per day.  Copyright  VHI. All rights reserved.

## 2017-05-31 ENCOUNTER — Encounter: Payer: PPO | Admitting: Physical Therapy

## 2017-06-05 ENCOUNTER — Encounter: Payer: PPO | Admitting: Physical Therapy

## 2017-06-06 ENCOUNTER — Other Ambulatory Visit: Payer: Self-pay | Admitting: Interventional Cardiology

## 2017-06-12 ENCOUNTER — Ambulatory Visit: Payer: PPO | Admitting: Physical Therapy

## 2017-06-12 DIAGNOSIS — M25611 Stiffness of right shoulder, not elsewhere classified: Secondary | ICD-10-CM

## 2017-06-12 DIAGNOSIS — G8929 Other chronic pain: Secondary | ICD-10-CM

## 2017-06-12 DIAGNOSIS — M25511 Pain in right shoulder: Secondary | ICD-10-CM | POA: Diagnosis not present

## 2017-06-12 DIAGNOSIS — R2681 Unsteadiness on feet: Secondary | ICD-10-CM

## 2017-06-12 DIAGNOSIS — R293 Abnormal posture: Secondary | ICD-10-CM

## 2017-06-12 NOTE — Therapy (Signed)
[place], [place], [address] Phone: [phone]   Fax:  [phone]  Physical Therapy Treatment  Patient Details  Name: [name] MRN: [id number] Date of Birth: [birth year] Referring Provider: Marton Redwood MD   Encounter Date: 06/12/2017  PT End of Session - 06/12/17 1504    Visit Number  5    Number of Visits  16    Date for PT Re-Evaluation  07/10/17    PT Start Time  4801    PT Stop Time  1503    PT Time Calculation (min)  46 min    Behavior During Therapy  [facility] for tasks assessed/performed       Past Medical History:  Diagnosis Date  . Anxiety   . CAD (coronary artery disease)   . Cellulitis 10/2015   LEFT FOOT  . Complication of anesthesia   . Coronary artery disease   . Diabetes mellitus without complication (HCC)    insulin dependent  . GERD (gastroesophageal reflux disease)   . Hypertension   . Hypothyroidism   . Neuromuscular disorder (HCC)    muscle cramps to lower extremities  . Other primary cardiomyopathies   . PONV (postoperative nausea and vomiting)   . Shortness of breath     Past Surgical History:  Procedure Laterality Date  . ABDOMINAL HYSTERECTOMY    . CHOLECYSTECTOMY    . CORONARY ANGIOPLASTY WITH STENT PLACEMENT  08/09/2011   DES  to mid circumflex  . I&D EXTREMITY Left 10/29/2015   Procedure: Irrigation and Debridement Left Foot;  Surgeon: Newt Minion, MD;  Location: [facility];  Service: Orthopedics;  Laterality: Left;  . LEFT HEART CATHETERIZATION WITH CORONARY ANGIOGRAM N/A 08/08/2012   Procedure: LEFT HEART CATHETERIZATION WITH CORONARY ANGIOGRAM;  Surgeon: [name], MD;  Location: [facility] CATH LAB;  Service: Cardiovascular;  Laterality: N/A;  . PERIPHERAL VASCULAR CATHETERIZATION N/A 12/30/2014   Procedure: Lower Extremity Angiography;  Surgeon: Wellington Hampshire, MD;  Location: [place] CV LAB;  Service: Cardiovascular;  Laterality: N/A;  . THYROID  SURGERY     radioactive iodine   . TONSILLECTOMY      There were no vitals filed for this visit.                   [place] Adult PT Treatment/Exercise - 06/12/17 0001      Self-Care   Self-Care  ADL's;Posture    ADL's  Handout review and domo of techniques,  she was familiar with many    Posture  sitting,  standing review.    Other Self-Care Comments   She tries to think about her posture off and on all day.       Shoulder Exercises: Seated   Other Seated Exercises  Isometric andominal review,  deep breathing.    Deep breathing with sitting on edge of chair and good posture,  cued for avoiding arch of back.    Other Seated Exercises  sit to stand instruction.        Shoulder Exercises: ROM/Strengthening   UBE (Upper Arm Bike)  N step 5 minutes UE/LE      Shoulder Exercises: Isometric Strengthening   Flexion  5X5" 2 sets    Extension  5X5" 2 sets    External Rotation  5X5" 2 sets    Internal Rotation  5X5" 2 sets    ABduction  5X5" 2 sets  PT Education - 06/12/17 1504    Education provided  Yes    Education Details  Posture and ADL.    Person(s) Educated  Patient    Methods  Explanation;Demonstration;Verbal cues;Handout    Comprehension  Verbalized understanding;Returned demonstration       PT Short Term Goals - 05/29/17 1803      PT SHORT TERM GOAL #1   Title  "Independent with initial HEP    Baseline  cues,  nompliant    Time  4    Period  Weeks    Status  On-going      PT SHORT TERM GOAL #2   Title  "Report pain decrease from   5 /10 to   3 /10.    Baseline  varies    Time  4    Period  Weeks    Status  On-going      PT SHORT TERM GOAL #3   Title  "Demonstrate understanding of proper sitting posture, body mechanics, work ergonomics, and be more conscious of position and posture throughout the day to not compress Right shoulder    Baseline  continue ed,  practice.    Time  4    Period  Weeks    Status  On-going      PT  SHORT TERM GOAL #4   Title  Tolerate light resistance exercises in flexion and scaption with minimal pain    Baseline  more than minimal pain    Time  4    Period  Weeks    Status  On-going        PT Long Term Goals - 05/15/17 1132      PT LONG TERM GOAL #1   Title  "Pt will be independent with advanced HEP.     Time  8    Period  Weeks    Status  New    Target Date  07/10/17      PT LONG TERM GOAL #2   Title  "Pain will decrease to =/< 2/10 with all functional activities    Time  8    Period  Weeks    Status  New    Target Date  07/10/17      PT LONG TERM GOAL #3   Title  R shoulder IR and ER will return to [facility] to return to 2/10 or less pain level ADLs such as dressing and grooming.     Time  8    Period  Weeks    Status  New      PT LONG TERM GOAL #4   Title  "FOTO will improve from  49% limtation  to  40% limtation   indicating improved functional mobility.     Time  8    Period  Weeks    Status  New      PT LONG TERM GOAL #5   Title  "R shoulder AROM scaption will improve to 0-120 degrees for improved overhead reaching in order to continue using clothes line for laundry    Time  8    Period  Weeks    Status  New    Target Date  07/10/17      PT LONG TERM GOAL #6   Title  Pt will be able to lift right shoulder independently onto steering wheel in order to drive more safely without assistance from left hand    Time  8    Period  Weeks  Status  New    Target Date  07/10/17            Plan - 06/12/17 1505    Clinical Impression Statement  Shoulder flexion 50 degrees AROM.  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 She is not consistantly doing her HEP.  She did not attend PT last week due to the Thanksgiving Holiday.  When she is done with PT , she plans to go to  [employer] and exercise 2-3 x a week. STG#3 met after instruction.     PT Next Visit Plan  Manual, assess  TPDN,   isometric exercises review.  Emphasize isometric at various ranges in supine more than cane,  and strengthening of right shoulder.Answer any body mechanics questions.     PT Home Exercise Plan  Doorway pectoral stretch ,  supine T band ER supine cane( on hold)  SHOULDER ISOMETRIC    Consulted and Agree with Plan of Care  Patient       Patient will benefit from skilled therapeutic intervention in order to improve the following deficits and impairments:     Visit Diagnosis: Chronic right shoulder pain  Unsteadiness on feet  Abnormal posture  Stiffness of right shoulder, not elsewhere classified     Problem List Patient Active Problem List   Diagnosis Date Noted  . Achilles tendon contracture, left 07/24/2016  . Ulcer of left foot, limited to breakdown of skin ([place]) 06/01/2016  . Cellulitis 10/27/2015  . Peripheral arterial disease ([place]) 10/20/2015  . Severe peripheral arterial disease ([place]) 12/28/2014  . Chronic diastolic heart failure ([place]) 12/30/2013  . Edema 12/30/2013  . Mixed hyperlipidemia 06/24/2013  . Mitral valve disorders(424.0) 06/24/2013  . Coronary artery disease   . Hypertension   . Diabetes mellitus without complication ([place])   . Other primary cardiomyopathies     HARRIS,KAREN  PTA 06/12/2017, 3:31 PM  [facility] [address] [place], [place], [address] Phone: [phone]   Fax:  [phone]  Name: [name] MRN: [id number] Date of Birth: [birth year]

## 2017-06-12 NOTE — Patient Instructions (Signed)

## 2017-06-12 NOTE — Therapy (Signed)
[name], [place], [address] Phone: [phone]   Fax:  [phone]  Physical Therapy Treatment  Patient Details  Name: [name] MRN: [id number] Date of Birth: [birth year] Referring Provider: Marton Redwood MD   Encounter Date: 06/12/2017  PT End of Session - 06/12/17 1504    Visit Number  5    Number of Visits  16    Date for PT Re-Evaluation  07/10/17    PT Start Time  7654    PT Stop Time  1503    PT Time Calculation (min)  46 min    Behavior During Therapy  [facility] for tasks assessed/performed       Past Medical History:  Diagnosis Date  . Anxiety   . CAD (coronary artery disease)   . Cellulitis 10/2015   LEFT FOOT  . Complication of anesthesia   . Coronary artery disease   . Diabetes mellitus without complication (HCC)    insulin dependent  . GERD (gastroesophageal reflux disease)   . Hypertension   . Hypothyroidism   . Neuromuscular disorder (HCC)    muscle cramps to lower extremities  . Other primary cardiomyopathies   . PONV (postoperative nausea and vomiting)   . Shortness of breath     Past Surgical History:  Procedure Laterality Date  . ABDOMINAL HYSTERECTOMY    . CHOLECYSTECTOMY    . CORONARY ANGIOPLASTY WITH STENT PLACEMENT  08/09/2011   DES  to mid circumflex  . I&D EXTREMITY Left 10/29/2015   Procedure: Irrigation and Debridement Left Foot;  Surgeon: Newt Minion, MD;  Location: [facility];  Service: Orthopedics;  Laterality: Left;  . LEFT HEART CATHETERIZATION WITH CORONARY ANGIOGRAM N/A 08/08/2012   Procedure: LEFT HEART CATHETERIZATION WITH CORONARY ANGIOGRAM;  Surgeon: [name], MD;  Location: [facility] CATH LAB;  Service: Cardiovascular;  Laterality: N/A;  . PERIPHERAL VASCULAR CATHETERIZATION N/A 12/30/2014   Procedure: Lower Extremity Angiography;  Surgeon: Wellington Hampshire, MD;  Location: [place] CV LAB;  Service: Cardiovascular;  Laterality: N/A;  . THYROID  SURGERY     radioactive iodine   . TONSILLECTOMY      There were no vitals filed for this visit.                   [place] Adult PT Treatment/Exercise - 06/12/17 0001      Self-Care   Self-Care  ADL's;Posture    ADL's  Handout review and domo of techniques,  she was familiar with many    Posture  sitting,  standing review.    Other Self-Care Comments   She tries to think about her posture off and on all day.       Shoulder Exercises: Seated   Other Seated Exercises  Isometric andominal review,  deep breathing.    Deep breathing with sitting on edge of chair and good posture,  cued for avoiding arch of back.    Other Seated Exercises  sit to stand instruction.        Shoulder Exercises: ROM/Strengthening   UBE (Upper Arm Bike)  N step 5 minutes UE/LE             PT Education - 06/12/17 1504    Education provided  Yes    Education Details  Posture and ADL.    Person(s) Educated  Patient    Methods  Explanation;Demonstration;Verbal cues;Handout    Comprehension  Verbalized understanding;Returned demonstration  PT Short Term Goals - 05/29/17 1803      PT SHORT TERM GOAL #1   Title  "Independent with initial HEP    Baseline  cues,  nompliant    Time  4    Period  Weeks    Status  On-going      PT SHORT TERM GOAL #2   Title  "Report pain decrease from   5 /10 to   3 /10.    Baseline  varies    Time  4    Period  Weeks    Status  On-going      PT SHORT TERM GOAL #3   Title  "Demonstrate understanding of proper sitting posture, body mechanics, work ergonomics, and be more conscious of position and posture throughout the day to not compress Right shoulder    Baseline  continue ed,  practice.    Time  4    Period  Weeks    Status  On-going      PT SHORT TERM GOAL #4   Title  Tolerate light resistance exercises in flexion and scaption with minimal pain    Baseline  more than minimal pain    Time  4    Period  Weeks    Status  On-going         PT Long Term Goals - 05/15/17 1132      PT LONG TERM GOAL #1   Title  "Pt will be independent with advanced HEP.     Time  8    Period  Weeks    Status  New    Target Date  07/10/17      PT LONG TERM GOAL #2   Title  "Pain will decrease to =/< 2/10 with all functional activities    Time  8    Period  Weeks    Status  New    Target Date  07/10/17      PT LONG TERM GOAL #3   Title  R shoulder IR and ER will return to [facility] to return to 2/10 or less pain level ADLs such as dressing and grooming.     Time  8    Period  Weeks    Status  New      PT LONG TERM GOAL #4   Title  "FOTO will improve from  49% limtation  to  40% limtation   indicating improved functional mobility.     Time  8    Period  Weeks    Status  New      PT LONG TERM GOAL #5   Title  "R shoulder AROM scaption will improve to 0-120 degrees for improved overhead reaching in order to continue using clothes line for laundry    Time  8    Period  Weeks    Status  New    Target Date  07/10/17      PT LONG TERM GOAL #6   Title  Pt will be able to lift right shoulder independently onto steering wheel in order to drive more safely without assistance from left hand    Time  8    Period  Weeks    Status  New    Target Date  07/10/17            Plan - 06/12/17 1505    Clinical Impression Statement  Shoulder flexion 50 degrees AROM.  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 She is not consistantly doing her HEP.  She did not attend PT last week due to the Thanksgiving Holiday.  When she is done with PT , she plans to go to  [employer] and exercise 2-3 x a week. STG#3 met after instruction.     PT Next Visit Plan  Manual, assess  TPDN,  isometric exercises review.  Emphasize isometric at various ranges in supine more than cane,  and strengthening of right shoulder.Answer any body mechanics questions.     PT Home Exercise Plan  Doorway  pectoral stretch ,  supine T band ER supine cane( on hold)  SHOULDER ISOMETRIC    Consulted and Agree with Plan of Care  Patient       Patient will benefit from skilled therapeutic intervention in order to improve the following deficits and impairments:     Visit Diagnosis: Chronic right shoulder pain  Unsteadiness on feet  Abnormal posture  Stiffness of right shoulder, not elsewhere classified     Problem List Patient Active Problem List   Diagnosis Date Noted  . Achilles tendon contracture, left 07/24/2016  . Ulcer of left foot, limited to breakdown of skin ([place]) 06/01/2016  . Cellulitis 10/27/2015  . Peripheral arterial disease ([place]) 10/20/2015  . Severe peripheral arterial disease ([place]) 12/28/2014  . Chronic diastolic heart failure ([place]) 12/30/2013  . Edema 12/30/2013  . Mixed hyperlipidemia 06/24/2013  . Mitral valve disorders(424.0) 06/24/2013  . Coronary artery disease   . Hypertension   . Diabetes mellitus without complication ([place])   . Other primary cardiomyopathies     HARRIS,KAREN PTA 06/12/2017, 3:17 PM  [facility] [address] [place], [place], [address] Phone: [phone]   Fax:  [phone]  Name: [name] MRN: [id number] Date of Birth: July 15, 1932

## 2017-06-14 ENCOUNTER — Ambulatory Visit: Payer: PPO | Admitting: Physical Therapy

## 2017-06-14 ENCOUNTER — Encounter: Payer: Self-pay | Admitting: Physical Therapy

## 2017-06-14 DIAGNOSIS — R293 Abnormal posture: Secondary | ICD-10-CM

## 2017-06-14 DIAGNOSIS — G8929 Other chronic pain: Secondary | ICD-10-CM

## 2017-06-14 DIAGNOSIS — M25511 Pain in right shoulder: Principal | ICD-10-CM

## 2017-06-14 DIAGNOSIS — M25611 Stiffness of right shoulder, not elsewhere classified: Secondary | ICD-10-CM

## 2017-06-14 DIAGNOSIS — R2681 Unsteadiness on feet: Secondary | ICD-10-CM

## 2017-06-14 NOTE — Therapy (Addendum)
[facility] [place], [place], [address] Phone: [phone]   Fax:  [phone]  Physical Therapy Treatment/ Discharge Note  Patient Details  Name: [name] MRN: [id number] Date of Birth: [birth year] Referring Provider: Marton Redwood MD   Encounter Date: 06/14/2017  PT End of Session - 06/14/17 1456    Visit Number  6    Number of Visits  16    Date for PT Re-Evaluation  07/10/17    Authorization Type  [employer]    Authorization Time Period  07-10-17    PT Start Time  1500       Past Medical History:  Diagnosis Date  . Anxiety   . CAD (coronary artery disease)   . Cellulitis 10/2015   LEFT FOOT  . Complication of anesthesia   . Coronary artery disease   . Diabetes mellitus without complication (HCC)    insulin dependent  . GERD (gastroesophageal reflux disease)   . Hypertension   . Hypothyroidism   . Neuromuscular disorder (HCC)    muscle cramps to lower extremities  . Other primary cardiomyopathies   . PONV (postoperative nausea and vomiting)   . Shortness of breath     Past Surgical History:  Procedure Laterality Date  . ABDOMINAL HYSTERECTOMY    . CHOLECYSTECTOMY    . CORONARY ANGIOPLASTY WITH STENT PLACEMENT  08/09/2011   DES  to mid circumflex  . I&D EXTREMITY Left 10/29/2015   Procedure: Irrigation and Debridement Left Foot;  Surgeon: Newt Minion, MD;  Location: [facility];  Service: Orthopedics;  Laterality: Left;  . LEFT HEART CATHETERIZATION WITH CORONARY ANGIOGRAM N/A 08/08/2012   Procedure: LEFT HEART CATHETERIZATION WITH CORONARY ANGIOGRAM;  Surgeon: [name], MD;  Location: [facility] CATH LAB;  Service: Cardiovascular;  Laterality: N/A;  . PERIPHERAL VASCULAR CATHETERIZATION N/A 12/30/2014   Procedure: Lower Extremity Angiography;  Surgeon: Wellington Hampshire, MD;  Location: [facility] CV LAB;  Service: Cardiovascular;  Laterality: N/A;  . THYROID SURGERY     radioactive  iodine   . TONSILLECTOMY      There were no vitals filed for this visit.  Subjective Assessment - 06/14/17 1502    Subjective  the pain level is going down but occassionally have a sharp pain     Pertinent History  CAD, DM, PAD, stress fx and sprained ankle on the right, cervical disc bulges ( 3), Left foot infection with surgery,     Limitations  Lifting;House hold activities;Other (comment)    Patient Stated Goals  I would like to be able to lift my arm to drive and lift onto steering wheel and lift above my  rightshoulder without using left to assist to put clothes pins on line    Currently in Pain?  Yes    Pain Score  5  at rest 0/10    Pain Location  Shoulder    Pain Orientation  Right    Pain Type  Chronic pain    Pain Onset  More than a month ago    Pain Frequency  Intermittent         [facility] PT Assessment - 06/14/17 1510      Assessment   Medical Diagnosis  right shoulder pain / arthropathy    Referring Provider  Marton Redwood MD      Observation/Other Assessments   Focus on Therapeutic Outcomes (FOTO)   FOTO Intake 36%  limitation 64% predicted 40%40%  AROM   Overall AROM   Deficits    Right Shoulder Extension  25 Degrees    Right Shoulder Flexion  55 Degrees    Right Shoulder ABduction  51 Degrees    Right Shoulder Internal Rotation  21 Degrees    Right Shoulder External Rotation  52 Degrees    Left Shoulder Extension  30 Degrees    Left Shoulder Flexion  149 Degrees    Left Shoulder ABduction  144 Degrees    Left Shoulder Internal Rotation  35 Degrees    Left Shoulder External Rotation  80 Degrees      PROM   Overall PROM   Deficits    Right Shoulder Flexion  149 Degrees    Right Shoulder ABduction  130 Degrees    Left Shoulder Flexion  155 Degrees    Left Shoulder ABduction  147 Degrees      Strength   Overall Strength  Deficits    Right Shoulder Flexion  3-/5    Right Shoulder Extension  4/5    Right Shoulder ABduction  3-/5    Right Shoulder  Internal Rotation  3-/5    Right Shoulder External Rotation  2+/5    Left Shoulder Flexion  4/5    Left Shoulder Extension  4/5    Left Shoulder ABduction  4/5    Left Shoulder Internal Rotation  4/5    Left Shoulder External Rotation  4/5                  [facility] Adult PT Treatment/Exercise - 06/14/17 1545      Shoulder Exercises: Standing   Other Standing Exercises  wall walking with left arm assisting right arm      Shoulder Exercises: ROM/Strengthening   UBE (Upper Arm Bike)  N step 5 minutes UE/LE    Graduated Retraction with Theraband  Pt cannot tolerate even yellow theraband for shoulder , able to do right elbow flexion with brachio radialis compensation    Other ROM/Strengthening Exercises  wall walk up with left hand assiting right arm for maximum stretch      Shoulder Exercises: Isometric Strengthening   Flexion  5X5" long lever arm    Extension  5X5"  long lever arm    External Rotation  5X5" long lever arm    Internal Rotation  5X5" long lever arm    ABduction  5X5" long lever arm      Manual Therapy   Manual Therapy  Taping    Kinesiotex  Ligament Correction      Kinesiotix   Ligament Correction  tried to give support to shoulder anteriorly , feels better but function is not               PT Short Term Goals - 06/14/17 1544      PT SHORT TERM GOAL #1   Title  "Independent with initial HEP    Baseline  only isometrics.  Pt cannot move arm in greater range than  measured    Time  4    Period  Weeks    Status  Achieved      PT SHORT TERM GOAL #2   Title  "Report pain decrease from   5 /10 to   3 /10.    Baseline  Pain is better 5/10 but still painful with movement  0/10 at rest    Status  Partially Met      PT SHORT TERM GOAL #3  Title  Engineer, mining understanding of proper sitting posture, body mechanics, work ergonomics, and be more conscious of position and posture throughout the day to not compress Right shoulder    Baseline  Pt  understands but cannot lift arm above head  but understand body mechanics but does not have functional strength in arm    Time  4    Period  Weeks    Status  Partially Met      PT SHORT TERM GOAL #4   Title  Tolerate light resistance exercises in flexion and scaption with minimal pain    Baseline  Pt unable to tolerate even light resistance with yellow tband    Status  Not Met        PT Long Term Goals - 06/14/17 1546      PT LONG TERM GOAL #1   Title  "Pt will be independent with advanced HEP.     Baseline  Pt returning to MD for further evaulation by orthopedic MD    Time  8    Period  Weeks    Status  Deferred      PT LONG TERM GOAL #2   Title  "Pain will decrease to =/< 2/10 with all functional activities    Baseline  Pt returning to MD to get further evaluation by ortho MD    Time  8    Period  Weeks    Status  Deferred      PT LONG TERM GOAL #3   Title  R shoulder IR and ER will return to [facility] to return to 2/10 or less pain level ADLs such as dressing and grooming.     Baseline  Pt returning to MD to get further evaluation by ortho MD    Time  8    Period  Weeks    Status  Deferred      PT LONG TERM GOAL #4   Title  "FOTO will improve from  49% limtation  to  40% limtation   indicating improved functional mobility.     Baseline  Pt FOTO intake 64% limitation 64%, predicted 40%    Time  8    Period  Weeks    Status  Not Met      PT LONG TERM GOAL #5   Title  "R shoulder AROM scaption will improve to 0-120 degrees for improved overhead reaching in order to continue using clothes line for laundry    Baseline  Pt returning to MD to get further evaluation by ortho MD    Time  8    Period  Weeks    Status  Deferred      PT LONG TERM GOAL #6   Title  Pt will be able to lift right shoulder independently onto steering wheel in order to drive more safely without assistance from left hand    Baseline  Pt unable to lift right shoulder greater than 55 degree Pt returning  to MD to get further evaluation by ortho MD    Time  8    Period  Weeks    Status  Deferred            Plan - 06/14/17 1702    Clinical Impression Statement  Pt presents with 5/10 pain on occassion instead of constant but Ms. Ledger is not improving with strength and is severely limited.  Pt has a knot on anterior aspect of proximal shoulder and may have old biceps partial tear. Pt  has not made any strength goals and is asking to return to gym at [employer].  Pt can participate with aerobic bicyles etc. but  her right arm is weak and limited in functional strength especially in flexion and ER of right shoulder. Conservative PT has failed to improve right shoulder function due to current shoulder structure/limitation.  She was only able to tolerate isometrics and lower ranges. and could not tolerate supine cane exercises. .  Pt with shoulder flexion only to 50 degrees, AROM. 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  She consistently does her exercises that she is able to do. Severely limited by functional strength. FOTO limitation is 64%today as compared to 49% on evaluation.   Pt LTG;s deferred due to needing further evaluation by orthopedic surgeon to explore realistic options. . No LTGs met due to deferred to MD evaluation.  Dr. Brigitte Pulse office called to alert them of Ms. Therriault current status.      Patient will benefit from skilled therapeutic intervention in order to improve the following deficits and impairments:     Visit Diagnosis: Chronic right shoulder pain  Unsteadiness on feet  Abnormal posture  Stiffness of right shoulder, not elsewhere classified   G-Codes - 06-21-2017 1711    Functional Assessment Tool Used (Outpatient Only)  FOTO    Functional Limitation  Carrying, moving and handling objects    Carrying, Moving and Handling Objects Goal Status (Y6503)  At least 20 percent but less than 40 percent impaired,  limited or restricted    Carrying, Moving and Handling Objects Discharge Status [phone])  At least 60 percent but less than 80 percent impaired, limited or restricted       Problem List Patient Active Problem List   Diagnosis Date Noted  . Achilles tendon contracture, left 07/24/2016  . Ulcer of left foot, limited to breakdown of skin ([place]) 06/01/2016  . Cellulitis 10/27/2015  . Peripheral arterial disease ([place]) 10/20/2015  . Severe peripheral arterial disease ([place]) 12/28/2014  . Chronic diastolic heart failure ([place]) 12/30/2013  . Edema 12/30/2013  . Mixed hyperlipidemia 06/24/2013  . Mitral valve disorders(424.0) 06/24/2013  . Coronary artery disease   . Hypertension   . Diabetes mellitus without complication ([place])   . Other primary cardiomyopathies     Voncille Lo, PT Certified Exercise Expert for the Aging Adult  06/21/17 5:30 PM Phone: [phone] Fax: [facility] [address] [place], [place], [address] Phone: [phone]   Fax:  [phone]  Name: [name] MRN: [id number] Date of Birth: May 03, 1932   PHYSICAL THERAPY DISCHARGE SUMMARY  Visits from Start of Care: 6  Current functional level related to goals / functional outcomes: As above   Remaining deficits: Pt with severe weakness of right arm. See clinic note and flowsheet   Education / Equipment: Posture, body mechanics, initial HEP with isometrics. Pt does not tolerate even yellow t band resistance Plan: Patient agrees to discharge.  Patient goals were not met. Patient is being discharged due to lack of progress.  ?????    And pt needing further evaluation by orthopedic MD to explore possible realistic options   Voncille Lo, PT Certified Exercise Expert for the Aging Adult  06-21-17 5:37 PM Phone: [phone] Fax: [phone]

## 2017-06-14 NOTE — Patient Instructions (Signed)
To keep your arm from freezing.  Assist your right arm with your left as you walk up a wall.   Walk Up Exercise (Active/Assistive)    With elbow straight, use fingers to "crawl" up wall or door frame as far as possible. Hold __30__ seconds. Repeat _5___ times. Do __5__ sessions per day.  Copyright  VHI. All rights reserved.   Pt given handout for straight arm isometrics.    Voncille Lo, PT Certified Exercise Expert for the Aging Adult  06/14/17 3:42 PM Phone: [phone] Fax: [phone]

## 2017-06-19 ENCOUNTER — Encounter: Payer: PPO | Admitting: Physical Therapy

## 2017-06-21 ENCOUNTER — Encounter: Payer: PPO | Admitting: Physical Therapy

## 2017-07-05 DIAGNOSIS — H524 Presbyopia: Secondary | ICD-10-CM | POA: Diagnosis not present

## 2017-07-05 DIAGNOSIS — Z794 Long term (current) use of insulin: Secondary | ICD-10-CM | POA: Diagnosis not present

## 2017-07-05 DIAGNOSIS — H5203 Hypermetropia, bilateral: Secondary | ICD-10-CM | POA: Diagnosis not present

## 2017-07-05 DIAGNOSIS — H52203 Unspecified astigmatism, bilateral: Secondary | ICD-10-CM | POA: Diagnosis not present

## 2017-07-05 DIAGNOSIS — Z961 Presence of intraocular lens: Secondary | ICD-10-CM | POA: Diagnosis not present

## 2017-07-05 DIAGNOSIS — E119 Type 2 diabetes mellitus without complications: Secondary | ICD-10-CM | POA: Diagnosis not present

## 2017-09-05 DIAGNOSIS — E1159 Type 2 diabetes mellitus with other circulatory complications: Secondary | ICD-10-CM | POA: Diagnosis not present

## 2017-09-05 DIAGNOSIS — Z6826 Body mass index (BMI) 26.0-26.9, adult: Secondary | ICD-10-CM | POA: Diagnosis not present

## 2017-09-05 DIAGNOSIS — I5032 Chronic diastolic (congestive) heart failure: Secondary | ICD-10-CM | POA: Diagnosis not present

## 2017-09-05 DIAGNOSIS — E048 Other specified nontoxic goiter: Secondary | ICD-10-CM | POA: Diagnosis not present

## 2017-09-05 DIAGNOSIS — E1140 Type 2 diabetes mellitus with diabetic neuropathy, unspecified: Secondary | ICD-10-CM | POA: Diagnosis not present

## 2017-09-05 DIAGNOSIS — I739 Peripheral vascular disease, unspecified: Secondary | ICD-10-CM | POA: Diagnosis not present

## 2017-09-05 DIAGNOSIS — Z794 Long term (current) use of insulin: Secondary | ICD-10-CM | POA: Diagnosis not present

## 2017-09-05 DIAGNOSIS — Z9861 Coronary angioplasty status: Secondary | ICD-10-CM | POA: Diagnosis not present

## 2017-09-05 DIAGNOSIS — R1310 Dysphagia, unspecified: Secondary | ICD-10-CM | POA: Diagnosis not present

## 2017-09-05 DIAGNOSIS — E1129 Type 2 diabetes mellitus with other diabetic kidney complication: Secondary | ICD-10-CM | POA: Diagnosis not present

## 2017-09-05 DIAGNOSIS — G609 Hereditary and idiopathic neuropathy, unspecified: Secondary | ICD-10-CM | POA: Diagnosis not present

## 2017-09-05 DIAGNOSIS — E11621 Type 2 diabetes mellitus with foot ulcer: Secondary | ICD-10-CM | POA: Diagnosis not present

## 2017-09-06 ENCOUNTER — Other Ambulatory Visit (HOSPITAL_COMMUNITY): Payer: Self-pay | Admitting: Internal Medicine

## 2017-09-06 DIAGNOSIS — E049 Nontoxic goiter, unspecified: Secondary | ICD-10-CM

## 2017-09-06 DIAGNOSIS — R1310 Dysphagia, unspecified: Secondary | ICD-10-CM

## 2017-09-12 ENCOUNTER — Other Ambulatory Visit: Payer: Self-pay | Admitting: Internal Medicine

## 2017-09-12 DIAGNOSIS — E042 Nontoxic multinodular goiter: Secondary | ICD-10-CM

## 2017-09-13 ENCOUNTER — Ambulatory Visit
Admission: RE | Admit: 2017-09-13 | Discharge: 2017-09-13 | Disposition: A | Discharge status: Home / Self Care | Payer: PPO | Source: Ambulatory Visit | Attending: Internal Medicine | Admitting: Internal Medicine

## 2017-09-13 DIAGNOSIS — E042 Nontoxic multinodular goiter: Secondary | ICD-10-CM | POA: Diagnosis not present

## 2017-09-19 ENCOUNTER — Encounter: Payer: Self-pay | Admitting: Podiatry

## 2017-09-19 ENCOUNTER — Ambulatory Visit: Payer: PPO | Admitting: Podiatry

## 2017-09-19 DIAGNOSIS — B351 Tinea unguium: Secondary | ICD-10-CM | POA: Diagnosis not present

## 2017-09-19 DIAGNOSIS — E119 Type 2 diabetes mellitus without complications: Secondary | ICD-10-CM

## 2017-09-19 DIAGNOSIS — M79676 Pain in unspecified toe(s): Secondary | ICD-10-CM

## 2017-09-19 DIAGNOSIS — L97521 Non-pressure chronic ulcer of other part of left foot limited to breakdown of skin: Secondary | ICD-10-CM | POA: Diagnosis not present

## 2017-09-19 NOTE — Progress Notes (Signed)
This patient presents the office for preventative foot care services.  She says that her nails have become thick and long and are painful walking and wearing her shoes.  . She also states that she ended up with a blister at the site of the callus on her left forefoot.  She says she was seen by her medical doctor, Dr. Brigitte Pulse,  who prescribed doxycycline for an infection in the left forefoot.  She says she proceeded to walk and the blister opened and drainage was noted. She says she then  started soaking the blistered left foot and bandaging after soaking the foot.   She presents the office today also requesting an evaluation of this blistered area on her left forefoot. She says she has finished her antibiotics this past Sunday. She presents the office today for continued treatment of her diabetic feet.  This patient also had a long history of an open skin lesion on the bottom of her left forefoot following skin surgery by another doctor.     General Appearance  Alert, conversant and in no acute stress.  Vascular  Dorsalis pedis and posterior tibial  pulses are palpable  bilaterally.  Capillary return is within normal limits  bilaterally. Temperature is within normal limits  bilaterally.  Neurologic  Senn-Weinstein monofilament wire test within normal limits  bilaterally. Muscle power within normal limits bilaterally.  Nails Thick disfigured discolored nails with subungual debris  from hallux to fifth toes bilaterally. No evidence of bacterial infection or drainage bilaterally.  Orthopedic  No limitations of motion of motion feet .  No crepitus or effusions noted.  No bony pathology or digital deformities noted.  Skin   peeling skin that is noted distal to the callus under the second and third metatarsals, left forefoot.  There is no evidence of any redness, swelling or drainage at this visit.  Significant dead tissue was noted at the site of the blister.  . There also appears to be of 6 x 8 mm ulcerated  tissue, sub-3 left foot.    Onychomycosis  B/L  Diabetuc ulcer/blister formation left foot.   Debridement of nails  X 10.  Debride necrotic tissue including the ulcerated tissue sub 3 met. down to normal tissue.  Silvadene/DSD applied.  Patient was given home soaking and bandaging instructions.  RTC 2 weeks for foolow up visit.  If this condition worsens or becomes very painful, the patient was told to contact this office or go to the Emergency Department at the hospital.  Gardiner Barefoot DPM

## 2017-10-03 ENCOUNTER — Ambulatory Visit: Payer: PPO | Admitting: Podiatry

## 2017-10-04 ENCOUNTER — Ambulatory Visit (HOSPITAL_COMMUNITY)
Admission: RE | Admit: 2017-10-04 | Discharge: 2017-10-04 | Disposition: A | Discharge status: Home / Self Care | Payer: PPO | Source: Ambulatory Visit | Attending: Internal Medicine | Admitting: Internal Medicine

## 2017-10-04 DIAGNOSIS — E049 Nontoxic goiter, unspecified: Secondary | ICD-10-CM

## 2017-10-04 DIAGNOSIS — K219 Gastro-esophageal reflux disease without esophagitis: Secondary | ICD-10-CM | POA: Insufficient documentation

## 2017-10-04 DIAGNOSIS — K222 Esophageal obstruction: Secondary | ICD-10-CM | POA: Insufficient documentation

## 2017-10-04 DIAGNOSIS — E042 Nontoxic multinodular goiter: Secondary | ICD-10-CM | POA: Insufficient documentation

## 2017-10-04 DIAGNOSIS — R1310 Dysphagia, unspecified: Secondary | ICD-10-CM

## 2017-10-12 ENCOUNTER — Encounter: Payer: Self-pay | Admitting: Podiatry

## 2017-10-12 ENCOUNTER — Ambulatory Visit: Payer: PPO | Admitting: Podiatry

## 2017-10-12 DIAGNOSIS — E1159 Type 2 diabetes mellitus with other circulatory complications: Secondary | ICD-10-CM

## 2017-10-12 DIAGNOSIS — L97521 Non-pressure chronic ulcer of other part of left foot limited to breakdown of skin: Secondary | ICD-10-CM | POA: Diagnosis not present

## 2017-10-12 NOTE — Progress Notes (Signed)
Patient presents to the office for continued evaluation of an ulcer on her left forefoot. She says that her blister/ulcer  has been healing since her last visit.  She says she is not experiencing pain at the site of the infected blister.  She says there is minimal drainage coming from the infected blister site.  This is the same area that an orthopedic doctor removed a skin lesion and it never properly has healed.  There is thickened skin under left forefoot which was the cover of the blister.  There is a pinpoint opening from which there is minimal drainage. She has been soaking and bandaging the ulcer/blister.    She presents the office today for continued evaluation and treatment of this ulcerated/blistered  skin lesion  GENERAL APPEARANCE: Alert, conversant. Appropriately groomed. No acute distress.  VASCULAR: Pedal pulses are  palpable at  [facility] and PT bilateral.  Capillary refill time is immediate to all digits,  Normal temperature gradient.  Digital hair growth is present bilateral  NEUROLOGIC: sensation is diminished to 5.07 monofilament at 5/5 sites bilateral.  Light touch is intact bilateral, Muscle strength normal.  MUSCULOSKELETAL: acceptable muscle strength, tone and stability bilateral.  Intrinsic muscluature intact bilateral.  Dorsiflexed second digit second toe left foot which causes a plantarflexed second metatarsal left foot.   DERMATOLOGIC: skin color, texture, and turgor are within normal limits except sub 2 left forefoot.  There is hypertrophied skin sub 2 left foot.  No evidence of redness or swelling or drainage.  There is a pinpoint opening at blister site. NAILS  Thick disfigured discolored nails both feet.   Dx.  Healing blister/ulcer sub 2 left foot.  Tx.  Debridement of necrotic tissue left forefoot.  Significant healing at blister/ulcer site left forefoot.  Only pinhole opening is present.  Patient was told that she needs to wear dispersion insoles to help allow this skin  lesion to heal.  She was told she can discontinue soaks and bandaging.  Patient was told to return to the office for preventative foot care services in 10 weeks.   Gardiner Barefoot DPM

## 2017-10-19 DIAGNOSIS — E042 Nontoxic multinodular goiter: Secondary | ICD-10-CM | POA: Diagnosis not present

## 2017-10-28 ENCOUNTER — Other Ambulatory Visit: Payer: Self-pay | Admitting: Interventional Cardiology

## 2018-01-08 NOTE — Progress Notes (Signed)
Cardiology Office Note   Date:  01/09/2018   ID:  [name], [name] [birth year], MRN [id number]  PCP:  Jeanette Redwood, Matthews    No chief complaint on file.  CAD  Wt Readings from Last 3 Encounters:  01/09/18 173 lb (78.5 kg)  01/09/17 173 lb 12.8 oz (78.8 kg)  10/04/16 178 lb (80.7 kg)       History of Present Illness: [name] is a 84 y.o. female  who had a circumflex stent placed in Jan 2014. She had a PTCA of the OM. She has a CTO of the RCA with good collaterals. She has not had anginal symptoms on medical therapy.  She has had a chronic sore on her foot.  She had a vascular w/u that showed PAD but no revasc could be performed.  It had healed, but then recurred.  She is followed by a podiatrist for the left foot wound.  SHe continues to do dressing changes.  She also has a chest mass which is enlarging thyroid, multinodular.  THere is tracheal deviation.    Denies : Chest pain. Dizziness. Leg edema. Nitroglycerin use. Orthopnea. Palpitations. Paroxysmal nocturnal dyspnea. Shortness of breath. Syncope.   DM management has also been a challenge.  Her husband's dementia is progressing.   Past Medical History:  Diagnosis Date  . Anxiety   . CAD (coronary artery disease)   . Cellulitis 10/2015   LEFT FOOT  . Complication of anesthesia   . Coronary artery disease   . Diabetes mellitus without complication (HCC)    insulin dependent  . GERD (gastroesophageal reflux disease)   . Hypertension   . Hypothyroidism   . Neuromuscular disorder (HCC)    muscle cramps to lower extremities  . Other primary cardiomyopathies   . PONV (postoperative nausea and vomiting)   . Shortness of breath     Past Surgical History:  Procedure Laterality Date  . ABDOMINAL HYSTERECTOMY    . CHOLECYSTECTOMY    . CORONARY ANGIOPLASTY WITH STENT PLACEMENT  08/09/2011   DES  to mid circumflex  . I&D EXTREMITY Left 10/29/2015   Procedure: Irrigation and Debridement Left Foot;  Surgeon:  [name];  Location: [facility];  Service: Orthopedics;  Laterality: Left;  . LEFT HEART CATHETERIZATION WITH CORONARY ANGIOGRAM N/A 08/08/2012   Procedure: LEFT HEART CATHETERIZATION WITH CORONARY ANGIOGRAM;  Surgeon: [name];  Location: [facility] CATH LAB;  Service: Cardiovascular;  Laterality: N/A;  . PERIPHERAL VASCULAR CATHETERIZATION N/A 12/30/2014   Procedure: Lower Extremity Angiography;  Surgeon: [name];  Location: [facility] CV LAB;  Service: Cardiovascular;  Laterality: N/A;  . THYROID SURGERY     radioactive iodine   . TONSILLECTOMY       Current Outpatient Medications  Medication Sig Dispense Refill  . amLODipine (NORVASC) 10 MG tablet Take 1 tablet (10 mg total) by mouth every afternoon 90 tablet 1  . aspirin EC 81 MG tablet Take 1 tablet (81 mg total) by mouth daily. 90 tablet 3  . atorvastatin (LIPITOR) 20 MG tablet Take 1 tablet (20 mg total) by mouth daily. 90 tablet 0  . BIOTIN PO Take 1 tablet by mouth daily.    . carvedilol (COREG) 25 MG tablet Take 1 tablet (25 mg total) by mouth 2 (two) times daily with a meal. *Please call and schedule an appointment with [name]* 60 tablet 0  . cholecalciferol (VITAMIN D) 1000 units tablet Take 1,000 Units by  mouth 2 (two) times daily.    . Coenzyme Q10 (COQ10 PO) Take 1 tablet by mouth daily.    Marland Kitchen LEVEMIR FLEXTOUCH 100 UNIT/ML Pen Inject 70 Units into the skin daily at 10 pm.     . lisinopril (PRINIVIL,ZESTRIL) 20 MG tablet Take 20 mg by mouth daily.     . nitroGLYCERIN (NITROSTAT) 0.4 MG SL tablet PLACE 1 TABLET UNDER THE TONGUE EVERY 5 MINUTES AS NEEDED FOR CHEST PAIN 25 tablet 1  . NOVOLOG FLEXPEN 100 UNIT/ML FlexPen Inject 20 Units into the skin 3 (three) times daily with meals.     . pantoprazole (PROTONIX) 40 MG tablet TAKE ONE TABLET BY MOUTH ONCE DAILY AT 6 A.M. 90 tablet 2  . Vitamin D, Ergocalciferol, (DRISDOL) 50000 units CAPS capsule Take 50,000 Units by mouth once a week.  11   No current  facility-administered medications for this visit.     Allergies:   Sulfa antibiotics    Social History:  The patient  reports that she quit smoking about 57 years ago. She has never used smokeless tobacco. She reports that she drinks alcohol. She reports that she does not use drugs.   Family History:  The patient's family history includes Diabetes in her sister; Heart attack in her father and grandchild; Heart disease in her father and son; Sudden death in her grandchild.    ROS:  Please see the history of present illness.   Otherwise, review of systems are positive for foot wound.   All other systems are reviewed and negative.    PHYSICAL EXAM: VS:  BP (!) 152/78   Pulse 81   Ht 5\' 8"  (1.727 m)   Wt 173 lb (78.5 kg)   SpO2 97%   BMI 26.30 kg/m  , BMI Body mass index is 26.3 kg/m. GEN: Well nourished, well developed, in no acute distress  HEENT: normal  Neck: no JVD, carotid bruits, or masses Cardiac: RRR; no murmurs, rubs, or gallops,no edema  Respiratory:  clear to auscultation bilaterally, normal work of breathing GI: soft, nontender, nondistended, + BS MS: no deformity or atrophy  Skin: warm and dry, no rash Neuro:  Strength and sensation are intact Psych: euthymic mood, full affect   EKG:   The ekg ordered today demonstrates NSR, QRS widening, nonspecific ST changes; no change from 2018 ECG   Recent Labs: No results found for requested labs within last 8760 hours.   Lipid Panel    Component Value Date/Time   CHOL 141 12/21/2014 0733   TRIG (H) 12/21/2014 0733    442.0 Triglyceride is over 400; calculations on Lipids are invalid.   HDL 29.10 (L) 12/21/2014 0733   CHOLHDL 5 12/21/2014 0733   VLDL 56.6 (H) 06/22/2014 0737   LDLCALC 24 10/20/2013 0739   LDLDIRECT 46.0 12/21/2014 0733     Other studies Reviewed: Additional studies/ records that were reviewed today with results demonstrating: records from 2017, [name] reviewed. LDL 91 in 2018  October.  ASSESSMENT AND PLAN:  1. CAD: No angina on medical therapy.  CONtinue aggressive secondary prevention.  We instructed her to decrease aspirin to 81 mg daily, but she prefers the 325 mg dose.  2. PAD: Refer back to [name].  He did want to f/u with her in 2017 and given the wound healing issues, revascularization may be reconsidered.   3. Hyperlipidemia: Continue atorvastatin.  LDL followed by PMD, as noted above.    Current medicines are reviewed at length with the  patient today.  The patient concerns regarding her medicines were addressed.  The following changes have been made:  No change  Labs/ tests ordered today include:   Orders Placed This Encounter  Procedures  . Ambulatory referral to Vascular Surgery  . EKG 12-Lead    Recommend 150 minutes/week of aerobic exercise Low fat, low carb, high fiber diet recommended  Disposition:   FU in 1 year   Signed, Larae Grooms, Matthews  01/09/2018 11:29 AM    [facility] [place], [place], [place]  [address] Phone: [phone]; Fax: [phone]

## 2018-01-09 ENCOUNTER — Ambulatory Visit: Payer: PPO | Admitting: Interventional Cardiology

## 2018-01-09 ENCOUNTER — Encounter (INDEPENDENT_AMBULATORY_CARE_PROVIDER_SITE_OTHER): Payer: Self-pay

## 2018-01-09 ENCOUNTER — Encounter: Payer: Self-pay | Admitting: Interventional Cardiology

## 2018-01-09 VITALS — BP 152/78 | HR 81 | Ht 68.0 in | Wt 173.0 lb

## 2018-01-09 DIAGNOSIS — E782 Mixed hyperlipidemia: Secondary | ICD-10-CM | POA: Diagnosis not present

## 2018-01-09 DIAGNOSIS — I739 Peripheral vascular disease, unspecified: Secondary | ICD-10-CM

## 2018-01-09 DIAGNOSIS — I25119 Atherosclerotic heart disease of native coronary artery with unspecified angina pectoris: Secondary | ICD-10-CM

## 2018-01-09 MED ORDER — AMLODIPINE BESYLATE 10 MG PO TABS: ORAL_TABLET | ORAL | 1 refills | Status: DC

## 2018-01-09 NOTE — Patient Instructions (Signed)
Medication Instructions:  Your physician has recommended you make the following change in your medication:   1. Take your Lisinopril 20 mg in the morning  2. Take your amlodipine 10 mg in the afternoon  3. Continue to take your carvedilol 25 mg in the morning and evening    Labwork: None ordered  Testing/Procedures: None ordered  Follow-Up: You have been referred to Dr. Trula Slade   Your physician wants you to follow-up in: 1 year with Dr. Irish Lack. You will receive a reminder letter in the mail two months in advance. If you don't receive a letter, please call our office to schedule the follow-up appointment.   Any Other Special Instructions Will Be Listed Below (If Applicable).     If you need a refill on your cardiac medications before your next appointment, please call your pharmacy.

## 2018-01-14 ENCOUNTER — Other Ambulatory Visit: Payer: Self-pay

## 2018-01-14 DIAGNOSIS — I7025 Atherosclerosis of native arteries of other extremities with ulceration: Secondary | ICD-10-CM

## 2018-02-13 ENCOUNTER — Encounter: Payer: Self-pay | Admitting: Podiatry

## 2018-02-13 ENCOUNTER — Ambulatory Visit: Payer: PPO | Admitting: Podiatry

## 2018-02-13 DIAGNOSIS — E1159 Type 2 diabetes mellitus with other circulatory complications: Secondary | ICD-10-CM

## 2018-02-13 DIAGNOSIS — L97521 Non-pressure chronic ulcer of other part of left foot limited to breakdown of skin: Secondary | ICD-10-CM | POA: Diagnosis not present

## 2018-02-13 NOTE — Progress Notes (Signed)
This patient returns to the office for continued evaluation and treatment of an open  ulcer left foot.  This skin lesion has been open for the last 2 years following surgery performed by Dr. Sharol Given.  I have tried local wound care Cam Walker orthoses and surgical shoes, but the ulcer continues to be present.  She does relate that she fell the ulcerhad healed but it has really opened.  She presents the office today for continued evaluation and treatment.  General Appearance  Alert, conversant and in no acute stress.  Vascular  Dorsalis pedis and posterior tibial  pulses are palpable  bilaterally.  Capillary return is within normal limits  bilaterally. Temperature is within normal limits  bilaterally.  Neurologic  Senn-Weinstein monofilament wire test diminished   bilaterally. Muscle power within normal limits bilaterally.  Nails Thick disfigured discolored nails with subungual debris  from hallux to fifth toes bilaterally. No evidence of bacterial infection or drainage bilaterally.  Orthopedic  No limitations of motion of motion feet .  No crepitus or effusions noted.  Dorsiflexed second digit with plantarflexion second metatarsal head left foot.  Skin  normotropic skin with no porokeratosis noted bilaterally with the exception of the ulcer, sub-2 left.  The ulcer is located sub-2 in the absence of any redness, swelling or infection.  No drainage noted. Healthy appearing tissue encircled by necrotic tissue.  Ulcer sub 2 left foot.  Treatment  Debridement of necrotic tissue left forefoot. She was told to continue soaking her ulcer and bandaging with honey.  Continue home soaks and applying honey at home.  Patient to return to the office in 2 weeks for further evaluation and treatment.   Gardiner Barefoot DPM

## 2018-02-27 ENCOUNTER — Ambulatory Visit (INDEPENDENT_AMBULATORY_CARE_PROVIDER_SITE_OTHER): Payer: PPO

## 2018-02-27 ENCOUNTER — Ambulatory Visit: Payer: PPO | Admitting: Podiatry

## 2018-02-27 ENCOUNTER — Encounter: Payer: Self-pay | Admitting: Podiatry

## 2018-02-27 DIAGNOSIS — L97521 Non-pressure chronic ulcer of other part of left foot limited to breakdown of skin: Secondary | ICD-10-CM

## 2018-02-27 DIAGNOSIS — L03119 Cellulitis of unspecified part of limb: Secondary | ICD-10-CM

## 2018-02-27 DIAGNOSIS — L02619 Cutaneous abscess of unspecified foot: Secondary | ICD-10-CM

## 2018-02-27 DIAGNOSIS — M216X2 Other acquired deformities of left foot: Secondary | ICD-10-CM

## 2018-02-27 DIAGNOSIS — M2042 Other hammer toe(s) (acquired), left foot: Secondary | ICD-10-CM

## 2018-02-27 MED ORDER — DOXYCYCLINE HYCLATE 100 MG PO TABS: 100.0000 mg | ORAL_TABLET | Freq: Two times a day (BID) | ORAL | 0 refills | Status: DC

## 2018-02-27 NOTE — Progress Notes (Signed)
This patient returns to the office for continued evaluation and treatment of an open diabetic ulcer left forefoot.  She was seen 2 weeks ago and was told to apply honey per Dr. Leigh Aurora recommendation.  She states the foot has gone from being sore to becoming very painful to walk.  She says that her second toe has become red and swollen.  He says that there is still drainage and bleeding noted at the ulcer site despite the fact that she has attempted to stay off her foot.  . She has been applying honey to the ulcer site with minimal improvement.  . She presents the office today stating that she is here for continued evaluation and treatment.   General Appearance  Alert, conversant and in no acute stress.  Vascular  Dorsalis pedis and posterior tibial  pulses are palpable  bilaterally.  Capillary return is within normal limits  bilaterally. Temperature is within normal limits  bilaterally.  Neurologic  Senn-Weinstein monofilament wire test within normal limits  bilaterally. Muscle power within normal limits bilaterally.  Nails Thick disfigured discolored nails with subungual debris  from hallux to fifth toes bilaterally. No evidence of bacterial infection or drainage bilaterally.  Orthopedic  No limitations of motion of motion feet .  No crepitus or effusions noted. Dorsiflexed second digit with relative plantar flexion second metatarsal head left foot.    Skin  normotropic skin with no porokeratosis noted bilaterally.  Ulcer persists under the second metatarsal left foot with clean granulation tissue.  The ulcer is surrounded by callused skin.  The skin distal to the ulcer is red and inflamed as well as the second toe being red and inflamed.     Diabetic ulcer secondary to plantarflexed second metatarsal left foot.    ROV.  Marland Kitchen X-rays taken reveal a lateral deviation of all the digits on the left foot.  Marked osteopenia noted to all the bones in her left foot.  . Discussed this condition with this  patient.  Told her that I feel we are not getting anywhere trying to use conservative treatment in closing this ulcer.  . I told this patient that she needs to be evaluated by a surgeon in the practice.  Dr. Amalia Hailey came in and evaluated her foot and agreed surgical correction was needed.  We are both concerned about her circulatory status, and therefore we decided to order vascular studies.  X-rays were also taken to rule out  bone infection to her left forefoot.  . Finally, her ulcer site was bandaged with Neosporin and a dry sterile dressing.  A prescription of doxycycline was called into the pharmacy.  She is to return the office in 10 days for an evaluation of her infection.  She needs to follow-up with Dr. Amalia Hailey foot with the results of the vascular studies   Gardiner Barefoot DPM

## 2018-02-28 ENCOUNTER — Telehealth: Payer: Self-pay | Admitting: Podiatry

## 2018-02-28 ENCOUNTER — Other Ambulatory Visit: Payer: Self-pay | Admitting: *Deleted

## 2018-02-28 ENCOUNTER — Telehealth: Payer: Self-pay | Admitting: *Deleted

## 2018-02-28 MED ORDER — DOXYCYCLINE HYCLATE 100 MG PO TABS: 100.0000 mg | ORAL_TABLET | Freq: Two times a day (BID) | ORAL | 0 refills | Status: DC

## 2018-02-28 NOTE — Telephone Encounter (Signed)
Called and spoke with patient and she has received her prescription for Doxycycline 100mg .

## 2018-02-28 NOTE — Telephone Encounter (Signed)
I saw Dr. Prudence Davidson yesterday and he prescribed doxycycline for me. He said he sent it to my pharmacy which [facility] [address]. However, I just called them to see if they said they had it in and they said they have not received anything from Dr. Prudence Davidson. I'm just checking to find out why? I definitely need to be on this. My number is [phone]. Thank you. Bye.

## 2018-03-08 ENCOUNTER — Ambulatory Visit: Payer: PPO | Admitting: Podiatry

## 2018-03-08 ENCOUNTER — Telehealth: Payer: Self-pay | Admitting: *Deleted

## 2018-03-08 ENCOUNTER — Encounter: Payer: Self-pay | Admitting: Podiatry

## 2018-03-08 DIAGNOSIS — L02619 Cutaneous abscess of unspecified foot: Secondary | ICD-10-CM

## 2018-03-08 DIAGNOSIS — L03119 Cellulitis of unspecified part of limb: Secondary | ICD-10-CM | POA: Diagnosis not present

## 2018-03-08 DIAGNOSIS — L97521 Non-pressure chronic ulcer of other part of left foot limited to breakdown of skin: Secondary | ICD-10-CM | POA: Diagnosis not present

## 2018-03-08 DIAGNOSIS — M216X2 Other acquired deformities of left foot: Secondary | ICD-10-CM

## 2018-03-08 DIAGNOSIS — M2042 Other hammer toe(s) (acquired), left foot: Secondary | ICD-10-CM

## 2018-03-08 MED ORDER — PENICILLIN V POTASSIUM 500 MG PO TABS: ORAL_TABLET | ORAL | 0 refills | Status: DC

## 2018-03-08 MED ORDER — PENICILLIN V POTASSIUM 250 MG PO TABS: 250.0000 mg | ORAL_TABLET | Freq: Four times a day (QID) | ORAL | 0 refills | Status: DC

## 2018-03-08 NOTE — Telephone Encounter (Signed)
[facility] states they do not have the Veetid 250mg , could 500mg  be used. Dr. Jacqualyn Posey states Veetid 500mg  bid #14 could be substituted.

## 2018-03-08 NOTE — Progress Notes (Signed)
This patient returns to the office for continued evaluation and treatment of an open  ulcer left foot.  This skin lesion has been open for the last 2 years following surgery performed by Dr. Sharol Given.  I have tried local wound care Cam Walker orthoses and surgical shoes, but the ulcer continues to be present.  He was seen by Dr.  Amalia Hailey who offered to perform surgery on her left forefoot.  He is awaiting results of vascular studies. She does relate that the ulcer remains open while she uses honey to close the wound.  She was diagnosed with cellulitis due to open ulcer last week and she was prescribed doxycycline.  Her ulcer appears to be healing in the absence of an infection.  There is thick discolored tissue at the top of her foot..  She presents the office today for continued evaluation and treatment.  General Appearance  Alert, conversant and in no acute stress.  Vascular  Dorsalis pedis and posterior tibial  pulses are palpable  bilaterally.  Capillary return is within normal limits  bilaterally. Temperature is within normal limits  bilaterally.  Neurologic  Senn-Weinstein monofilament wire test diminished   bilaterally. Muscle power within normal limits bilaterally.  Nails Thick disfigured discolored nails with subungual debris  from hallux to fifth toes bilaterally. No evidence of bacterial infection or drainage bilaterally.  Orthopedic  No limitations of motion of motion feet .  No crepitus or effusions noted.  Dorsiflexed second digit with plantarflexion second metatarsal head left foot.  Skin  normotropic skin with no porokeratosis noted bilaterally with the exception of the ulcer, sub-2 left.  The ulcer is located sub-2 in the absence of any redness, swelling or infection.  The ulcer measures 5 mm. X 5 ... Healthy appearing tissue encircled by necrotic tissue.  Thick need dorsal tissue over MPJ left foot.  No redness or cellulitis noted.  Ulcer sub 2 left foot.  Treatment  Debridement of  necrotic tissue left forefoot. She was told to continue soaking her ulcer and bandaging with honey.  Continue home soaks and applying honey at home. She was prescribed penicillin .  RTC with Dr Amalia Hailey to determine vascular status.  Patient to call the office if this condition worsens. Since they were out of 250 mg.  Dr Jacqualyn Posey called in 500 mg  Bid.   Gardiner Barefoot DPM

## 2018-03-13 ENCOUNTER — Encounter: Payer: Self-pay | Admitting: Podiatry

## 2018-03-13 ENCOUNTER — Ambulatory Visit: Payer: PPO | Admitting: Podiatry

## 2018-03-13 ENCOUNTER — Other Ambulatory Visit: Payer: Self-pay | Admitting: Podiatry

## 2018-03-13 ENCOUNTER — Telehealth: Payer: Self-pay | Admitting: Podiatry

## 2018-03-13 DIAGNOSIS — L97521 Non-pressure chronic ulcer of other part of left foot limited to breakdown of skin: Secondary | ICD-10-CM

## 2018-03-13 NOTE — Progress Notes (Signed)
This patient presents the office concerned about a blood clot that came out of the left forefoot ulcer yesterday.  She was concerned since she had not seen this clot before.  She also has been concerned that this could be related to an infection that has come and resolved and come again to the left forefoot ulcer.  She is presently taking penicillin 500 mg 1 twice a day.  She says she has been bandaging and soaking her foot He presents the office today wearing regular shoes.  She says she is awaiting September 9 when her vascular studies can be performed.  These studies will be sent to Dr. Amalia Hailey, we will then consider surgery.  General Appearance  Alert, conversant and in no acute stress.  Vascular  Dorsalis pedis and posterior tibial  pulses are palpable  bilaterally.  Capillary return is within normal limits  bilaterally. Temperature is within normal limits  bilaterally.  Neurologic  Senn-Weinstein monofilament wire test diminished   bilaterally. Muscle power within normal limits bilaterally.  Nails Thick disfigured discolored nails with subungual debris  from hallux to fifth toes bilaterally. No evidence of bacterial infection or drainage bilaterally.  Orthopedic  No limitations of motion  feet .  No crepitus or effusions noted.  Dorsiflexion  Of  second digit with plantarflexion of the second metatarsal left forefoot.    Skin  normotropic skin with no porokeratosis noted bilaterally. . The ulcer, sub-2 remains 5 mm. X 5  Mm.  There appears to be necrotic tissue noted in the center of the ulcer.  A culturette was used and a culture was taken of the  ulcer site left forefoot and there is   tunneling going proximal to the ulcer.  The dorsal aspect of the left foot remains swollen and thickened.     Ulcer sub 2 left foot.  Treatment.  . Culture and sensitivity was taken of the ulcer left foot.  She was told to continue soaking her ulcer and bandaging with honey.  Her ulcer today was bandaged with  honey and a dry sterile dressing.  Marland Kitchen She will continue taking her penicillin daily.  RTC prn.   Gardiner Barefoot DPM

## 2018-03-17 ENCOUNTER — Other Ambulatory Visit: Payer: Self-pay | Admitting: Podiatry

## 2018-03-18 LAB — WOUND CULTURE
MICRO NUMBER:: 91030170
SPECIMEN QUALITY:: ADEQUATE

## 2018-03-19 ENCOUNTER — Telehealth: Payer: Self-pay | Admitting: *Deleted

## 2018-03-19 MED ORDER — DOXYCYCLINE HYCLATE 100 MG PO CAPS: 100.0000 mg | ORAL_CAPSULE | Freq: Two times a day (BID) | ORAL | 0 refills | Status: DC

## 2018-03-19 NOTE — Telephone Encounter (Signed)
Dr. Prudence Davidson states the wound culture grew out methicillin MRSA, d/c penicillin and begin doxycycline 100mg  #28 one capsule bid. Orders called to pt and escribed to [facility].

## 2018-03-20 ENCOUNTER — Other Ambulatory Visit: Payer: Self-pay | Admitting: Interventional Cardiology

## 2018-03-25 ENCOUNTER — Ambulatory Visit (HOSPITAL_COMMUNITY)
Admission: RE | Admit: 2018-03-25 | Discharge: 2018-03-25 | Disposition: A | Discharge status: Home / Self Care | Payer: PPO | Source: Ambulatory Visit | Attending: Surgery | Admitting: Surgery

## 2018-03-25 ENCOUNTER — Ambulatory Visit (INDEPENDENT_AMBULATORY_CARE_PROVIDER_SITE_OTHER)
Admission: RE | Admit: 2018-03-25 | Discharge: 2018-03-25 | Disposition: A | Discharge status: Home / Self Care | Payer: PPO | Source: Ambulatory Visit | Attending: Surgery | Admitting: Surgery

## 2018-03-25 ENCOUNTER — Encounter: Payer: Self-pay | Admitting: Surgery

## 2018-03-25 ENCOUNTER — Encounter: Payer: Self-pay | Admitting: *Deleted

## 2018-03-25 ENCOUNTER — Ambulatory Visit: Payer: PPO | Admitting: Surgery

## 2018-03-25 ENCOUNTER — Other Ambulatory Visit: Payer: Self-pay | Admitting: *Deleted

## 2018-03-25 ENCOUNTER — Other Ambulatory Visit: Payer: Self-pay

## 2018-03-25 VITALS — BP 154/79 | HR 78 | Temp 97.0°F | Resp 16 | Ht 68.0 in | Wt 165.0 lb

## 2018-03-25 DIAGNOSIS — I7025 Atherosclerosis of native arteries of other extremities with ulceration: Secondary | ICD-10-CM

## 2018-03-25 NOTE — Progress Notes (Signed)
 [facility] [place]  Patient name: [name] MRN: [id number] DOB: 05/18/1932 Sex: female   REASON FOR VISIT:    Follow up  HISOTRY OF PRESENT ILLNESS:    [name] is a 85 y.o. female who I had seen several years ago for a nonhealing wound on her left foot.  She had come to me having had angiography by Dr. Arida.  She was found to have 2 mild to moderate superficial femoral-popliteal lesions and single-vessel runoff via the peroneal.  No intervention was performed.  The last time I saw her, she was not interested in intervention.  She did not follow-up last year.  Patient has seen Dr. Duda in the past 2 has undergone debridements and immobilization however the wound is not healed.  She is now seeing podiatry and is using meta honey.  Patient suffers from coronary artery disease she is undergone PCI in the past.  The patient is a diabetic.  She is on statin for hypercholesterolemia and takes an ACE inhibitor for hypertension.   PAST MEDICAL HISTORY:   Past Medical History:  Diagnosis Date  . Anxiety   . CAD (coronary artery disease)   . Cellulitis 10/2015   LEFT FOOT  . Complication of anesthesia   . Coronary artery disease   . Diabetes mellitus without complication (HCC)    insulin dependent  . GERD (gastroesophageal reflux disease)   . Hypertension   . Hypothyroidism   . Neuromuscular disorder (HCC)    muscle cramps to lower extremities  . Other primary cardiomyopathies   . PONV (postoperative nausea and vomiting)   . Shortness of breath      FAMILY HISTORY:   Family History  Problem Relation Age of Onset  . Heart disease Father   . Heart attack Father   . Diabetes Sister   . Heart disease Son        before age 60  . Heart attack Grandchild        2yr old  . Sudden death Grandchild   . Hypertension Neg Hx     SOCIAL HISTORY:   Social History   Tobacco Use  . Smoking status: Former Smoker   Last attempt to quit: 11/28/1960    Years since quitting: 57.3  . Smokeless tobacco: Never Used  Substance Use Topics  . Alcohol use: Yes    Alcohol/week: 0.0 standard drinks    Comment: rare     ALLERGIES:   Allergies  Allergen Reactions  . Sulfa Antibiotics Nausea And Vomiting    Severe vomiting.      CURRENT MEDICATIONS:   Current Outpatient Medications  Medication Sig Dispense Refill  . amLODipine (NORVASC) 10 MG tablet Take 1 tablet (10 mg total) by mouth every afternoon 90 tablet 1  . aspirin EC 81 MG tablet Take 1 tablet (81 mg total) by mouth daily. 90 tablet 3  . atorvastatin (LIPITOR) 20 MG tablet Take 1 tablet (20 mg total) by mouth daily. 90 tablet 0  . BIOTIN PO Take 1 tablet by mouth daily.    . carvedilol (COREG) 25 MG tablet Take 1 tablet (25 mg total) by mouth 2 (two) times daily with a meal. *Please call and schedule an appointment with Dr Arida* 60 tablet 0  . cholecalciferol (VITAMIN D) 1000 units tablet Take 1,000 Units by mouth 2 (two) times daily.    . Coenzyme Q10 (COQ10 PO) Take 1 tablet by mouth daily.    . doxycycline (  VIBRA-TABS) 100 MG tablet Take 1 tablet (100 mg total) by mouth 2 (two) times daily. 20 tablet 0  . doxycycline (VIBRAMYCIN) 100 MG capsule Take 1 capsule (100 mg total) by mouth 2 (two) times daily. 28 capsule 0  . LEVEMIR FLEXTOUCH 100 UNIT/ML Pen Inject 70 Units into the skin daily at 10 pm.     . lisinopril (PRINIVIL,ZESTRIL) 20 MG tablet Take 20 mg by mouth daily.     . nitroGLYCERIN (NITROSTAT) 0.4 MG SL tablet PLACE 1 TABLET UNDER THE TONGUE EVERY 5 MINUTES AS NEEDED FOR CHEST PAIN 25 tablet 1  . NOVOLOG FLEXPEN 100 UNIT/ML FlexPen Inject 20 Units into the skin 3 (three) times daily with meals.     . pantoprazole (PROTONIX) 40 MG tablet TAKE 1 TABLET BY MOUTH ONCE DAILY AT  6AM 90 tablet 2  . penicillin v potassium (VEETID) 500 MG tablet Take 500 mg by mouth 2 (two) times daily.  0  . Vitamin D, Ergocalciferol, (DRISDOL) 50000  units CAPS capsule Take 50,000 Units by mouth once a week.  11   No current facility-administered medications for this visit.     REVIEW OF SYSTEMS:   [X] denotes positive finding, [ ] denotes negative finding Cardiac  Comments:  Chest pain or chest pressure:    Shortness of breath upon exertion:    Short of breath when lying flat:    Irregular heart rhythm:        Vascular    Pain in calf, thigh, or hip brought on by ambulation:    Pain in feet at night that wakes you up from your sleep:  x   Blood clot in your veins:    Leg swelling:  x       Pulmonary    Oxygen at home:    Productive cough:     Wheezing:         Neurologic    Sudden weakness in arms or legs:     Sudden numbness in arms or legs:     Sudden onset of difficulty speaking or slurred speech:    Temporary loss of vision in one eye:     Problems with dizziness:  x       Gastrointestinal    Blood in stool:     Vomited blood:         Genitourinary    Burning when urinating:     Blood in urine:        Psychiatric    Major depression:         Hematologic    Bleeding problems:    Problems with blood clotting too easily:        Skin    Rashes or ulcers:        Constitutional    Fever or chills:      PHYSICAL EXAM:   Vitals:   03/25/18 1433 03/25/18 1439  BP: (!) 170/85 (!) 154/79  Pulse: 79 78  Resp: 16   Temp: (!) 97 F (36.1 C)   TempSrc: Oral   SpO2: 98%   Weight: 165 lb (74.8 kg)   Height: 5' 8" (1.727 m)     GENERAL: The patient is a well-nourished female, in no acute distress. The vital signs are documented above. CARDIAC: There is a regular rate and rhythm.  VASCULAR: Nonpalpable pedal pulses, bilateral edema PULMONARY: Non-labored respirations ABDOMEN: Soft and non-tender with normal pitched bowel sounds.  MUSCULOSKELETAL: There are no major deformities or cyanosis. NEUROLOGIC:   No focal weakness or paresthesias are detected. SKIN: There are no ulcers or rashes  noted. PSYCHIATRIC: The patient has a normal affect.  STUDIES:   I have reviewed the vascular lab studies with the following findings:  +-------+-----------+-----------+------------+------------+ ABI/TBIToday's ABIToday's TBIPrevious ABIPrevious TBI +-------+-----------+-----------+------------+------------+ Right 0.66    0.27                 +-------+-----------+-----------+------------+------------+ Left  0.59    0.12                 +-------+-----------+-----------+------------+------------+ Left great toe pressure = 20 Right toe pressure= 45   Right: 50-74% stenosis noted in the superficial femoral artery. Total occlusion noted in the posterior tibial artery. Waveform analysis suggest outflow disease in the tibial arteries.  Left: 50-74% stenosis noted in the superficial femoral artery. Waveform analysis suggest outflow disease in the tibial arteries.  MEDICAL ISSUES:   Diabetic foot ulcer: This is a chronic wound which is been there for several years.  She has undergone diagnostic angiography in the past.  Vascular lab studies today suggest severe peripheral vascular disease, which is the most likely the reason why she is having difficulty healing her wound, in conjunction with her diabetes.  I recommended proceeding with angiography.  This would be through a right femoral approach and intervention on the left leg as indicated.  This is been scheduled for Tuesday, October 1.  All questions were answered today.    Wells Casie Sturgeon, MD [facility] [place] Tel [phone] Pager [phone] 

## 2018-03-25 NOTE — H&P (View-Only) (Signed)
[facility] [place]  Patient name: [name] MRN: [id number] DOB: December 26, 1931 Sex: female   REASON FOR VISIT:    Follow up  HISOTRY OF PRESENT ILLNESS:    [name] is a 84 y.o. female who I had seen several years ago for a nonhealing wound on her left foot.  She had come to me having had angiography by Dr. Fletcher Anon.  She was found to have 2 mild to moderate superficial femoral-popliteal lesions and single-vessel runoff via the peroneal.  No intervention was performed.  The last time I saw her, she was not interested in intervention.  She did not follow-up last year.  Patient has seen Dr. Sharol Given in the past 2 has undergone debridements and immobilization however the wound is not healed.  She is now seeing podiatry and is using meta honey.  Patient suffers from coronary artery disease she is undergone PCI in the past.  The patient is a diabetic.  She is on statin for hypercholesterolemia and takes an ACE inhibitor for hypertension.   PAST MEDICAL HISTORY:   Past Medical History:  Diagnosis Date  . Anxiety   . CAD (coronary artery disease)   . Cellulitis 10/2015   LEFT FOOT  . Complication of anesthesia   . Coronary artery disease   . Diabetes mellitus without complication (HCC)    insulin dependent  . GERD (gastroesophageal reflux disease)   . Hypertension   . Hypothyroidism   . Neuromuscular disorder (HCC)    muscle cramps to lower extremities  . Other primary cardiomyopathies   . PONV (postoperative nausea and vomiting)   . Shortness of breath      FAMILY HISTORY:   Family History  Problem Relation Age of Onset  . Heart disease Father   . Heart attack Father   . Diabetes Sister   . Heart disease Son        before age 76  . Heart attack 39        82yrold  . Sudden death Grandchild   . Hypertension Neg Hx     SOCIAL HISTORY:   Social History   Tobacco Use  . Smoking status: Former Smoker   Last attempt to quit: 11/28/1960    Years since quitting: 57.3  . Smokeless tobacco: Never Used  Substance Use Topics  . Alcohol use: Yes    Alcohol/week: 0.0 standard drinks    Comment: rare     ALLERGIES:   Allergies  Allergen Reactions  . Sulfa Antibiotics Nausea And Vomiting    Severe vomiting.      CURRENT MEDICATIONS:   Current Outpatient Medications  Medication Sig Dispense Refill  . amLODipine (NORVASC) 10 MG tablet Take 1 tablet (10 mg total) by mouth every afternoon 90 tablet 1  . aspirin EC 81 MG tablet Take 1 tablet (81 mg total) by mouth daily. 90 tablet 3  . atorvastatin (LIPITOR) 20 MG tablet Take 1 tablet (20 mg total) by mouth daily. 90 tablet 0  . BIOTIN PO Take 1 tablet by mouth daily.    . carvedilol (COREG) 25 MG tablet Take 1 tablet (25 mg total) by mouth 2 (two) times daily with a meal. *Please call and schedule an appointment with Dr AFletcher Anon 60 tablet 0  . cholecalciferol (VITAMIN D) 1000 units tablet Take 1,000 Units by mouth 2 (two) times daily.    . Coenzyme Q10 (COQ10 PO) Take 1 tablet by mouth daily.    .Marland Kitchendoxycycline (  VIBRA-TABS) 100 MG tablet Take 1 tablet (100 mg total) by mouth 2 (two) times daily. 20 tablet 0  . doxycycline (VIBRAMYCIN) 100 MG capsule Take 1 capsule (100 mg total) by mouth 2 (two) times daily. 28 capsule 0  . LEVEMIR FLEXTOUCH 100 UNIT/ML Pen Inject 70 Units into the skin daily at 10 pm.     . lisinopril (PRINIVIL,ZESTRIL) 20 MG tablet Take 20 mg by mouth daily.     . nitroGLYCERIN (NITROSTAT) 0.4 MG SL tablet PLACE 1 TABLET UNDER THE TONGUE EVERY 5 MINUTES AS NEEDED FOR CHEST PAIN 25 tablet 1  . NOVOLOG FLEXPEN 100 UNIT/ML FlexPen Inject 20 Units into the skin 3 (three) times daily with meals.     . pantoprazole (PROTONIX) 40 MG tablet TAKE 1 TABLET BY MOUTH ONCE DAILY AT  6AM 90 tablet 2  . penicillin v potassium (VEETID) 500 MG tablet Take 500 mg by mouth 2 (two) times daily.  0  . Vitamin D, Ergocalciferol, (DRISDOL) 50000  units CAPS capsule Take 50,000 Units by mouth once a week.  11   No current facility-administered medications for this visit.     REVIEW OF SYSTEMS:   [X]$  denotes positive finding, [ ]$  denotes negative finding Cardiac  Comments:  Chest pain or chest pressure:    Shortness of breath upon exertion:    Short of breath when lying flat:    Irregular heart rhythm:        Vascular    Pain in calf, thigh, or hip brought on by ambulation:    Pain in feet at night that wakes you up from your sleep:  x   Blood clot in your veins:    Leg swelling:  x       Pulmonary    Oxygen at home:    Productive cough:     Wheezing:         Neurologic    Sudden weakness in arms or legs:     Sudden numbness in arms or legs:     Sudden onset of difficulty speaking or slurred speech:    Temporary loss of vision in one eye:     Problems with dizziness:  x       Gastrointestinal    Blood in stool:     Vomited blood:         Genitourinary    Burning when urinating:     Blood in urine:        Psychiatric    Major depression:         Hematologic    Bleeding problems:    Problems with blood clotting too easily:        Skin    Rashes or ulcers:        Constitutional    Fever or chills:      PHYSICAL EXAM:   Vitals:   03/25/18 1433 03/25/18 1439  BP: (!) 170/85 (!) 154/79  Pulse: 79 78  Resp: 16   Temp: (!) 97 F (36.1 C)   TempSrc: Oral   SpO2: 98%   Weight: 165 lb (74.8 kg)   Height: 5' 8"$  (1.727 m)     GENERAL: The patient is a well-nourished female, in no acute distress. The vital signs are documented above. CARDIAC: There is a regular rate and rhythm.  VASCULAR: Nonpalpable pedal pulses, bilateral edema PULMONARY: Non-labored respirations ABDOMEN: Soft and non-tender with normal pitched bowel sounds.  MUSCULOSKELETAL: There are no major deformities or cyanosis. NEUROLOGIC:  No focal weakness or paresthesias are detected. SKIN: There are no ulcers or rashes  noted. PSYCHIATRIC: The patient has a normal affect.  STUDIES:   I have reviewed the vascular lab studies with the following findings:  +-------+-----------+-----------+------------+------------+ ABI/TBIToday's ABIToday's TBIPrevious ABIPrevious TBI +-------+-----------+-----------+------------+------------+ Right 0.66    0.27                 +-------+-----------+-----------+------------+------------+ Left  0.59    0.12                 +-------+-----------+-----------+------------+------------+ Left great toe pressure = 20 Right toe pressure= 45   Right: 50-74% stenosis noted in the superficial femoral artery. Total occlusion noted in the posterior tibial artery. Waveform analysis suggest outflow disease in the tibial arteries.  Left: 50-74% stenosis noted in the superficial femoral artery. Waveform analysis suggest outflow disease in the tibial arteries.  MEDICAL ISSUES:   Diabetic foot ulcer: This is a chronic wound which is been there for several years.  She has undergone diagnostic angiography in the past.  Vascular lab studies today suggest severe peripheral vascular disease, which is the most likely the reason why she is having difficulty healing her wound, in conjunction with her diabetes.  I recommended proceeding with angiography.  This would be through a right femoral approach and intervention on the left leg as indicated.  This is been scheduled for Tuesday, October 1.  All questions were answered today.    [name], MD [facility] [phone] Pager [phone]

## 2018-03-25 NOTE — Progress Notes (Signed)
Vitals:   03/25/18 1433  BP: (!) 170/85  Pulse: 79  Resp: 16  Temp: (!) 97 F (36.1 C)  TempSrc: Oral  SpO2: 98%  Weight: 165 lb (74.8 kg)  Height: 5\' 8"  (1.727 m)

## 2018-04-01 ENCOUNTER — Other Ambulatory Visit: Payer: Self-pay | Admitting: Podiatry

## 2018-04-03 ENCOUNTER — Telehealth: Payer: Self-pay | Admitting: *Deleted

## 2018-04-03 NOTE — Telephone Encounter (Signed)
Faxed doxycycline 100mg  capsules faxed to [facility].

## 2018-04-09 DIAGNOSIS — Z7189 Other specified counseling: Secondary | ICD-10-CM | POA: Diagnosis not present

## 2018-04-09 DIAGNOSIS — I7389 Other specified peripheral vascular diseases: Secondary | ICD-10-CM | POA: Diagnosis not present

## 2018-04-16 ENCOUNTER — Encounter (HOSPITAL_COMMUNITY)
Admission: RE | Disposition: A | Discharge status: Home / Self Care | Payer: Self-pay | Source: Ambulatory Visit | Attending: Surgery

## 2018-04-16 ENCOUNTER — Other Ambulatory Visit: Payer: Self-pay

## 2018-04-16 ENCOUNTER — Ambulatory Visit (HOSPITAL_COMMUNITY)
Admission: RE | Admit: 2018-04-16 | Discharge: 2018-04-16 | Disposition: A | Discharge status: Home / Self Care | Payer: PPO | Source: Ambulatory Visit | Attending: Surgery | Admitting: Surgery

## 2018-04-16 ENCOUNTER — Encounter (HOSPITAL_COMMUNITY): Payer: Self-pay | Admitting: Surgery

## 2018-04-16 DIAGNOSIS — I70245 Atherosclerosis of native arteries of left leg with ulceration of other part of foot: Secondary | ICD-10-CM | POA: Diagnosis not present

## 2018-04-16 DIAGNOSIS — Z87891 Personal history of nicotine dependence: Secondary | ICD-10-CM | POA: Insufficient documentation

## 2018-04-16 DIAGNOSIS — E039 Hypothyroidism, unspecified: Secondary | ICD-10-CM | POA: Diagnosis not present

## 2018-04-16 DIAGNOSIS — K219 Gastro-esophageal reflux disease without esophagitis: Secondary | ICD-10-CM | POA: Insufficient documentation

## 2018-04-16 DIAGNOSIS — I2510 Atherosclerotic heart disease of native coronary artery without angina pectoris: Secondary | ICD-10-CM | POA: Insufficient documentation

## 2018-04-16 DIAGNOSIS — F419 Anxiety disorder, unspecified: Secondary | ICD-10-CM | POA: Insufficient documentation

## 2018-04-16 DIAGNOSIS — I701 Atherosclerosis of renal artery: Secondary | ICD-10-CM | POA: Diagnosis not present

## 2018-04-16 DIAGNOSIS — Z794 Long term (current) use of insulin: Secondary | ICD-10-CM | POA: Diagnosis not present

## 2018-04-16 DIAGNOSIS — I428 Other cardiomyopathies: Secondary | ICD-10-CM | POA: Insufficient documentation

## 2018-04-16 DIAGNOSIS — E11621 Type 2 diabetes mellitus with foot ulcer: Secondary | ICD-10-CM | POA: Insufficient documentation

## 2018-04-16 DIAGNOSIS — Z7982 Long term (current) use of aspirin: Secondary | ICD-10-CM | POA: Insufficient documentation

## 2018-04-16 DIAGNOSIS — Z882 Allergy status to sulfonamides status: Secondary | ICD-10-CM | POA: Insufficient documentation

## 2018-04-16 DIAGNOSIS — I10 Essential (primary) hypertension: Secondary | ICD-10-CM | POA: Insufficient documentation

## 2018-04-16 DIAGNOSIS — L97529 Non-pressure chronic ulcer of other part of left foot with unspecified severity: Secondary | ICD-10-CM | POA: Insufficient documentation

## 2018-04-16 DIAGNOSIS — E1151 Type 2 diabetes mellitus with diabetic peripheral angiopathy without gangrene: Secondary | ICD-10-CM | POA: Insufficient documentation

## 2018-04-16 HISTORY — PX: PERIPHERAL VASCULAR BALLOON ANGIOPLASTY: CATH118281

## 2018-04-16 HISTORY — PX: ABDOMINAL AORTOGRAM W/LOWER EXTREMITY: CATH118223

## 2018-04-16 HISTORY — PX: PERIPHERAL VASCULAR ATHERECTOMY: CATH118256

## 2018-04-16 LAB — POCT ACTIVATED CLOTTING TIME
Activated Clotting Time: 197 seconds
Activated Clotting Time: 197 seconds

## 2018-04-16 LAB — POCT I-STAT, CHEM 8
BUN: 20 mg/dL (ref 8–23)
Calcium, Ion: 1.2 mmol/L (ref 1.15–1.40)
Chloride: 106 mmol/L (ref 98–111)
Creatinine, Ser: 0.7 mg/dL (ref 0.44–1.00)
Glucose, Bld: 174 mg/dL — ABNORMAL HIGH (ref 70–99)
HCT: 31 % — ABNORMAL LOW (ref 36.0–46.0)
Hemoglobin: 10.5 g/dL — ABNORMAL LOW (ref 12.0–15.0)
Potassium: 4 mmol/L (ref 3.5–5.1)
Sodium: 141 mmol/L (ref 135–145)
TCO2: 25 mmol/L (ref 22–32)

## 2018-04-16 LAB — GLUCOSE, CAPILLARY: Glucose-Capillary: 179 mg/dL — ABNORMAL HIGH (ref 70–99)

## 2018-04-16 SURGERY — ABDOMINAL AORTOGRAM W/LOWER EXTREMITY
Anesthesia: LOCAL

## 2018-04-16 MED ORDER — FENTANYL CITRATE (PF) 100 MCG/2ML IJ SOLN
INTRAMUSCULAR | Status: AC
  Filled 2018-04-16: qty 2

## 2018-04-16 MED ORDER — SODIUM CHLORIDE 0.9 % IV SOLN
INTRAVENOUS | Status: DC
  Administered 2018-04-16: 08:00:00 via INTRAVENOUS

## 2018-04-16 MED ORDER — HEPARIN (PORCINE) IN NACL 1000-0.9 UT/500ML-% IV SOLN
INTRAVENOUS | Status: DC | PRN
  Administered 2018-04-16: 500 mL

## 2018-04-16 MED ORDER — HYDRALAZINE HCL 20 MG/ML IJ SOLN: 5.0000 mg | INTRAMUSCULAR | Status: DC | PRN

## 2018-04-16 MED ORDER — HEPARIN (PORCINE) IN NACL 1000-0.9 UT/500ML-% IV SOLN
INTRAVENOUS | Status: AC
  Filled 2018-04-16: qty 1000

## 2018-04-16 MED ORDER — SODIUM CHLORIDE 0.9% FLUSH: 3.0000 mL | INTRAVENOUS | Status: DC | PRN

## 2018-04-16 MED ORDER — MIDAZOLAM HCL 2 MG/2ML IJ SOLN
INTRAMUSCULAR | Status: AC
  Filled 2018-04-16: qty 2

## 2018-04-16 MED ORDER — MIDAZOLAM HCL 2 MG/2ML IJ SOLN
INTRAMUSCULAR | Status: DC | PRN
  Administered 2018-04-16: 0.5 mg via INTRAVENOUS
  Administered 2018-04-16: 1 mg via INTRAVENOUS
  Administered 2018-04-16: 2 mg via INTRAVENOUS

## 2018-04-16 MED ORDER — VIPERSLIDE LUBRICANT OPTIME: TOPICAL | Status: DC | PRN

## 2018-04-16 MED ORDER — LIDOCAINE HCL (PF) 1 % IJ SOLN
INTRAMUSCULAR | Status: AC
  Filled 2018-04-16: qty 30

## 2018-04-16 MED ORDER — ASPIRIN 81 MG PO CHEW
CHEWABLE_TABLET | ORAL | Status: AC
  Administered 2018-04-16: 81 mg
  Filled 2018-04-16: qty 1

## 2018-04-16 MED ORDER — OXYCODONE HCL 5 MG PO TABS: 5.0000 mg | ORAL_TABLET | ORAL | Status: DC | PRN

## 2018-04-16 MED ORDER — SODIUM CHLORIDE 0.9 % WEIGHT BASED INFUSION: 1.0000 mL/kg/h | INTRAVENOUS | Status: DC

## 2018-04-16 MED ORDER — ASPIRIN EC 81 MG PO TBEC: 81.0000 mg | DELAYED_RELEASE_TABLET | Freq: Every day | ORAL | Status: DC

## 2018-04-16 MED ORDER — CLOPIDOGREL BISULFATE 75 MG PO TABS: 75.0000 mg | ORAL_TABLET | Freq: Every day | ORAL | Status: DC

## 2018-04-16 MED ORDER — CLOPIDOGREL BISULFATE 75 MG PO TABS: 75.0000 mg | ORAL_TABLET | Freq: Every day | ORAL | 11 refills | Status: DC

## 2018-04-16 MED ORDER — ONDANSETRON HCL 4 MG/2ML IJ SOLN: 4.0000 mg | Freq: Four times a day (QID) | INTRAMUSCULAR | Status: DC | PRN

## 2018-04-16 MED ORDER — SODIUM CHLORIDE 0.9% FLUSH: 3.0000 mL | Freq: Two times a day (BID) | INTRAVENOUS | Status: DC

## 2018-04-16 MED ORDER — HEPARIN SODIUM (PORCINE) 1000 UNIT/ML IJ SOLN
INTRAMUSCULAR | Status: AC
  Filled 2018-04-16: qty 1

## 2018-04-16 MED ORDER — NITROGLYCERIN IN D5W 200-5 MCG/ML-% IV SOLN
INTRAVENOUS | Status: AC
  Filled 2018-04-16: qty 250

## 2018-04-16 MED ORDER — ATORVASTATIN CALCIUM 10 MG PO TABS: 10.0000 mg | ORAL_TABLET | Freq: Every day | ORAL | Status: DC

## 2018-04-16 MED ORDER — FENTANYL CITRATE (PF) 100 MCG/2ML IJ SOLN
INTRAMUSCULAR | Status: DC | PRN
  Administered 2018-04-16 (×2): 25 ug via INTRAVENOUS
  Administered 2018-04-16: 50 ug via INTRAVENOUS
  Administered 2018-04-16: 25 ug via INTRAVENOUS

## 2018-04-16 MED ORDER — VERAPAMIL HCL 2.5 MG/ML IV SOLN
INTRAVENOUS | Status: AC
  Filled 2018-04-16: qty 2

## 2018-04-16 MED ORDER — CLOPIDOGREL BISULFATE 300 MG PO TABS
ORAL_TABLET | ORAL | Status: AC
  Filled 2018-04-16: qty 1

## 2018-04-16 MED ORDER — LIDOCAINE HCL (PF) 1 % IJ SOLN
INTRAMUSCULAR | Status: DC | PRN
  Administered 2018-04-16: 15 mL

## 2018-04-16 MED ORDER — HEPARIN SODIUM (PORCINE) 1000 UNIT/ML IJ SOLN
INTRAMUSCULAR | Status: DC | PRN
  Administered 2018-04-16: 3000 [IU] via INTRAVENOUS
  Administered 2018-04-16: 8000 [IU] via INTRAVENOUS
  Administered 2018-04-16: 2000 [IU] via INTRAVENOUS

## 2018-04-16 MED ORDER — LABETALOL HCL 5 MG/ML IV SOLN: 10.0000 mg | INTRAVENOUS | Status: DC | PRN

## 2018-04-16 MED ORDER — IODIXANOL 320 MG/ML IV SOLN
INTRAVENOUS | Status: DC | PRN
  Administered 2018-04-16: 210 mL via INTRA_ARTERIAL

## 2018-04-16 MED ORDER — MORPHINE SULFATE (PF) 2 MG/ML IV SOLN: 2.0000 mg | INTRAVENOUS | Status: DC | PRN

## 2018-04-16 MED ORDER — ACETAMINOPHEN 325 MG PO TABS: 650.0000 mg | ORAL_TABLET | ORAL | Status: DC | PRN

## 2018-04-16 MED ORDER — VIPERSLIDE LUBRICANT OPTIME
TOPICAL | Status: DC | PRN
  Administered 2018-04-16: 11:00:00 via SURGICAL_CAVITY

## 2018-04-16 MED ORDER — SODIUM CHLORIDE 0.9 % IV SOLN: 250.0000 mL | INTRAVENOUS | Status: DC | PRN

## 2018-04-16 MED ORDER — CLOPIDOGREL BISULFATE 75 MG PO TABS
ORAL_TABLET | ORAL | Status: DC | PRN
  Administered 2018-04-16: 300 mg via ORAL

## 2018-04-16 MED ORDER — NITROGLYCERIN 1 MG/10 ML FOR IR/CATH LAB
INTRA_ARTERIAL | Status: DC | PRN
  Administered 2018-04-16: 800 ug via INTRA_ARTERIAL
  Administered 2018-04-16: 300 ug via INTRA_ARTERIAL

## 2018-04-16 SURGICAL SUPPLY — 34 items
BALLN LUTONIX DCB 5X100X130 (BALLOONS) ×6
BALLN STERLING OTW 3X150X150 (BALLOONS) ×3
BALLN STERLING OTW 5X100X135 (BALLOONS) ×3
BALLN STERLING SL OTW 3X80X150 (BALLOONS) ×3
BALLOON LUTONIX DCB 5X100X130 (BALLOONS) IMPLANT
BALLOON STERLING OTW 3X150X150 (BALLOONS) IMPLANT
BALLOON STERLING OTW 5X100X135 (BALLOONS) IMPLANT
BALLOON STRLNG SL OTW 3X80X150 (BALLOONS) IMPLANT
CATH OMNI FLUSH 5F 65CM (CATHETERS) ×1 IMPLANT
CATH QUICKCROSS .035X135CM (MICROCATHETER) ×1 IMPLANT
CATH SOFT-VU 4F 65 STRAIGHT (CATHETERS) IMPLANT
CATH SOFT-VU STRAIGHT 4F 65CM (CATHETERS) ×3
CROWN STEALTH MICRO-30 1.25MM (CATHETERS) ×1 IMPLANT
DEVICE CLOSURE MYNXGRIP 6/7F (Vascular Products) ×1 IMPLANT
DEVICE TORQUE H2O (MISCELLANEOUS) ×1 IMPLANT
DRAPE ZERO GRAVITY STERILE (DRAPES) ×1 IMPLANT
GUIDEWIRE ANGLED .035X150CM (WIRE) ×1 IMPLANT
KIT ENCORE 26 ADVANTAGE (KITS) ×1 IMPLANT
KIT MICROPUNCTURE NIT STIFF (SHEATH) ×1 IMPLANT
KIT PV (KITS) ×3 IMPLANT
LUBRICANT VIPERSLIDE CORONARY (MISCELLANEOUS) ×1 IMPLANT
SHEATH HIGHFLEX ANSEL 6FRX55 (SHEATH) ×1 IMPLANT
SHEATH PINNACLE 5F 10CM (SHEATH) ×1 IMPLANT
SHEATH PINNACLE 6F 10CM (SHEATH) ×1 IMPLANT
SHEATH PROBE COVER 6X72 (BAG) ×1 IMPLANT
SYR MEDRAD MARK V 150ML (SYRINGE) ×3 IMPLANT
TAPE VIPERTRACK RADIOPAQ (MISCELLANEOUS) IMPLANT
TAPE VIPERTRACK RADIOPAQUE (MISCELLANEOUS) ×3
TRANSDUCER W/STOPCOCK (MISCELLANEOUS) ×3 IMPLANT
TRAY PV CATH (CUSTOM PROCEDURE TRAY) ×3 IMPLANT
WIRE BENTSON .035X145CM (WIRE) ×1 IMPLANT
WIRE ROSEN-J .035X260CM (WIRE) ×1 IMPLANT
WIRE SPARTACORE .014X300CM (WIRE) ×1 IMPLANT
WIRE VIPER ADVANCE .017X335CM (WIRE) ×1 IMPLANT

## 2018-04-16 NOTE — Op Note (Signed)
Patient name: [name] MRN: [id number] DOB: Sep 01, 1931 Sex: female  04/16/2018 Pre-operative Diagnosis: Left foot ulcer Post-operative diagnosis:  Same Surgeon:  [name] Procedure Performed:  1.  Ultrasound-guided access, right femoral artery  2.  Abdominal aortogram  3.  Left lower extremity runoff  4.  Drug-coated balloon angioplasty, left superficial femoral artery  5.  Atherectomy with angioplasty, left peroneal artery  6.  Angioplasty, left anterior tibial artery  7.  Intra-arterial administration of nitroglycerin  8.  Conscious sedation (135 minutes)   Indications: The patient has a nonhealing wound on her left foot.  Ultrasound identified severe vascular disease.  She is here today for further evaluation and possible intervention  Procedure:  The patient was identified in the holding area and taken to room 8.  The patient was then placed supine on the table and prepped and draped in the usual sterile fashion.  A time out was called.  Conscious sedation was administered with use of IV fentanyl and Versed under continuous physician and nurse monitoring.  Heart rate, blood pressure, and oxygen saturation were continuously monitored.  Ultrasound was used to evaluate the right common femoral artery.  It was patent .  A digital ultrasound image was acquired.  A micropuncture needle was used to access the right common femoral artery under ultrasound guidance.  An 018 wire was advanced without resistance and a micropuncture sheath was placed.  The 018 wire was removed and a benson wire was placed.  The micropuncture sheath was exchanged for a 5 french sheath.  An omniflush catheter was advanced over the wire to the level of L-1.  An abdominal angiogram was obtained.  Next, using the omniflush catheter and a benson wire, the aortic bifurcation was crossed and the catheter was placed into theleft external iliac artery and left runoff was obtained.    Findings:   Aortogram: 60% left  renal artery stenosis.  The right renal artery is widely patent.  The infrarenal abdominal aorta is widely patent.  Bilateral common and external iliac arteries widely patent  Right Lower Extremity: Not evaluated secondary to contrast utilization for intervention  Left Lower Extremity: Left common femoral and profundofemoral artery are widely patent without stenosis.  The superficial femoral artery is patent however there are to moderate segment lengths of luminal narrowing of approximately 60-70%.  There is single-vessel runoff via the peroneal artery which is diffusely diseased for the first 5-10 cm with stenosis approaching 80%.  There is reconstitution of the distal anterior tibial artery.  Intervention: After the above images were acquired the decision was made to proceed with intervention.  Over a 035 Rosen wire, a 6 French 55 cm high flex Ansell sheath was advanced into the left superficial femoral artery.  The patient was then fully heparinized.  I used a quick cross catheter and a Sparta core wire to gain access into the peroneal artery.  The Sparta core wire was exchanged out for a viper wire.  I then performed orbital atherectomy using a CSI micro-device at low medium and high speeds for the origin of the peroneal artery.  During this process, 800 mcg of nitroglycerin was administered intra-arterial.  I then selected a 3 x 80 Sterling balloon and performed balloon angioplasty of the peroneal artery.  Follow-up imaging revealed in-line flow without stenosis to the peroneal artery.  I was somewhat concerned that she may have lost some collaterals out to the foot.  Before addressing that, I elected to intervene on  the superficial femoral artery.  There were 2 lesions of approximately 80 cm in length with 70% stenosis that I elected to treat separately with a 5 x 100 x 2 blue tonics balloons.  Each lesion was treated to 7 atm for 2 minutes.  Completion imaging on the distal lesion there was a  non-flow-limiting dissection.  I inserted a 5 x 100 balloon and inflated this a low pressure for 2 minutes across this area.  Follow-up imaging showed persistent dissection however this was not flow-limiting and therefore elected not to intervene at this.  At this point, additional images of the foot showed that there is still decreased perfusion.  I felt like I needed to attempt to recanalize the occluded anterior tibial artery.  I used a 3 x 150 Sterling balloon as a support catheter and then used a Zilient wire to cross the occlusion.  A contrast injection was performed with the catheter tip in the dorsalis pedis artery, confirming successful recanalization.  I then performed balloon angioplasty of the anterior tibial artery with the 3 x 150 balloon taking the balloon to 9 atm for 2 minutes with each inflation.  Completion imaging showed in-line flow through the anterior tibial and peroneal artery down to the ankle.  The anterior tibial crosses out onto the foot with good perfusion of the foot which was significantly improved.  With these results, I elected to terminate the procedure.  Catheters and wires were removed.  The 6 French sheath was exchanged out for a short sheath and a Mynx device was used for closure.  Impression:  #1  60% left renal artery stenosis  #2  80% origin stenosis of the peroneal artery successfully treated using a CSI micro-device and a 3 mm  #3  Tandem 60-70% lesions within the superficial femoral artery on the left successfully treated with drug-eluting 5 x 100 and tonics balloon  #4  Recanalization of occluded left anterior tibial artery and subsequent balloon angioplasty with a 3 mm balloon    V. [name], M.D. [facility] [place] Office: [phone] Pager:  [phone]

## 2018-04-16 NOTE — Interval H&P Note (Signed)
History and Physical Interval Note:  04/16/2018 8:55 AM  [name]  has presented today for surgery, with the diagnosis of pvd w/ulcer  The various methods of treatment have been discussed with the patient and family. After consideration of risks, benefits and other options for treatment, the patient has consented to  Procedure(s): ABDOMINAL AORTOGRAM W/LOWER EXTREMITY (N/A) as a surgical intervention .  The patient's history has been reviewed, patient examined, no change in status, stable for surgery.  I have reviewed the patient's chart and labs.  Questions were answered to the patient's satisfaction.     [name]

## 2018-04-16 NOTE — Discharge Instructions (Signed)
Femoral Site Care °Refer to this sheet in the next few weeks. These instructions provide you with information about caring for yourself after your procedure. Your health care provider may also give you more specific instructions. Your treatment has been planned according to current medical practices, but problems sometimes occur. Call your health care provider if you have any problems or questions after your procedure. °What can I expect after the procedure? °After your procedure, it is typical to have the following: °· Bruising at the site that usually fades within 1-2 weeks. °· Blood collecting in the tissue (hematoma) that may be painful to the touch. It should usually decrease in size and tenderness within 1-2 weeks. ° °Follow these instructions at home: °· Take medicines only as directed by your health care provider. °· You may shower 24-48 hours after the procedure or as directed by your health care provider. Remove the bandage (dressing) and gently wash the site with plain soap and water. Pat the area dry with a clean towel. Do not rub the site, because this may cause bleeding. °· Do not take baths, swim, or use a hot tub until your health care provider approves. °· Check your insertion site every day for redness, swelling, or drainage. °· Do not apply powder or lotion to the site. °· Limit use of stairs to twice a day for the first 2-3 days or as directed by your health care provider. °· Do not squat for the first 2-3 days or as directed by your health care provider. °· Do not lift over 10 lb (4.5 kg) for 5 days after your procedure or as directed by your health care provider. °· Ask your health care provider when it is okay to: °? Return to work or school. °? Resume usual physical activities or sports. °? Resume sexual activity. °· Do not drive home if you are discharged the same day as the procedure. Have someone else drive you. °· You may drive 24 hours after the procedure unless otherwise instructed by  your health care provider. °· Do not operate machinery or power tools for 24 hours after the procedure or as directed by your health care provider. °· If your procedure was done as an outpatient procedure, which means that you went home the same day as your procedure, a responsible adult should be with you for the first 24 hours after you arrive home. °· Keep all follow-up visits as directed by your health care provider. This is important. °Contact a health care provider if: °· You have a fever. °· You have chills. °· You have increased bleeding from the site. Hold pressure on the site. °Get help right away if: °· You have unusual pain at the site. °· You have redness, warmth, or swelling at the site. °· You have drainage (other than a small amount of blood on the dressing) from the site. °· The site is bleeding, and the bleeding does not stop after 30 minutes of holding steady pressure on the site. °· Your leg or foot becomes pale, cool, tingly, or numb. °This information is not intended to replace advice given to you by your health care provider. Make sure you discuss any questions you have with your health care provider. °Document Released: 03/06/2014 Document Revised: 12/09/2015 Document Reviewed: 01/20/2014 °Elsevier Interactive Patient Education © 2018 [employer] °Moderate Conscious Sedation, Adult, Care After °These instructions provide you with information about caring for yourself after your procedure. Your health care provider may also give you more   specific instructions. Your treatment has been planned according to current medical practices, but problems sometimes occur. Call your health care provider if you have any problems or questions after your procedure. °What can I expect after the procedure? °After your procedure, it is common: °· To feel sleepy for several hours. °· To feel clumsy and have poor balance for several hours. °· To have poor judgment for several hours. °· To vomit if you eat too  soon. ° °Follow these instructions at home: °For at least 24 hours after the procedure: ° °· Do not: °? Participate in activities where you could fall or become injured. °? Drive. °? Use heavy machinery. °? Drink alcohol. °? Take sleeping pills or medicines that cause drowsiness. °? Make important decisions or sign legal documents. °? Take care of children on your own. °· Rest. °Eating and drinking °· Follow the diet recommended by your health care provider. °· If you vomit: °? Drink water, juice, or soup when you can drink without vomiting. °? Make sure you have little or no nausea before eating solid foods. °General instructions °· Have a responsible adult stay with you until you are awake and alert. °· Take over-the-counter and prescription medicines only as told by your health care provider. °· If you smoke, do not smoke without supervision. °· Keep all follow-up visits as told by your health care provider. This is important. °Contact a health care provider if: °· You keep feeling nauseous or you keep vomiting. °· You feel light-headed. °· You develop a rash. °· You have a fever. °Get help right away if: °· You have trouble breathing. °This information is not intended to replace advice given to you by your health care provider. Make sure you discuss any questions you have with your health care provider. °Document Released: 04/23/2013 Document Revised: 12/06/2015 Document Reviewed: 10/23/2015 °Elsevier Interactive Patient Education © 2018 [employer] Inc. ° °

## 2018-04-17 ENCOUNTER — Telehealth: Payer: Self-pay | Admitting: Surgery

## 2018-04-17 ENCOUNTER — Telehealth: Payer: Self-pay | Admitting: *Deleted

## 2018-04-17 NOTE — Telephone Encounter (Signed)
sch appt spk to pt 05/27/18 2pm ABI 3pm LE Art 345 pm f/u MD

## 2018-04-17 NOTE — Telephone Encounter (Signed)
Patient had questions regarding compression stockings.

## 2018-04-17 NOTE — Telephone Encounter (Signed)
After a lengthy discussion on compression stockings and how they support venous return. It appears that her  stockings are poor fit/too tight. Discouraged use.

## 2018-04-18 ENCOUNTER — Other Ambulatory Visit: Payer: Self-pay

## 2018-04-18 ENCOUNTER — Other Ambulatory Visit: Payer: Self-pay | Admitting: Podiatry

## 2018-04-18 DIAGNOSIS — I7025 Atherosclerosis of native arteries of other extremities with ulceration: Secondary | ICD-10-CM

## 2018-04-23 ENCOUNTER — Ambulatory Visit (INDEPENDENT_AMBULATORY_CARE_PROVIDER_SITE_OTHER): Payer: PPO

## 2018-04-23 ENCOUNTER — Encounter: Payer: Self-pay | Admitting: Podiatry

## 2018-04-23 ENCOUNTER — Ambulatory Visit: Payer: PPO | Admitting: Podiatry

## 2018-04-23 ENCOUNTER — Other Ambulatory Visit: Payer: Self-pay | Admitting: Podiatry

## 2018-04-23 DIAGNOSIS — L97521 Non-pressure chronic ulcer of other part of left foot limited to breakdown of skin: Secondary | ICD-10-CM

## 2018-04-23 DIAGNOSIS — S99922A Unspecified injury of left foot, initial encounter: Secondary | ICD-10-CM | POA: Diagnosis not present

## 2018-05-01 ENCOUNTER — Ambulatory Visit: Payer: PPO | Admitting: Podiatry

## 2018-05-01 DIAGNOSIS — S92302G Fracture of unspecified metatarsal bone(s), left foot, subsequent encounter for fracture with delayed healing: Secondary | ICD-10-CM

## 2018-05-01 DIAGNOSIS — I70245 Atherosclerosis of native arteries of left leg with ulceration of other part of foot: Secondary | ICD-10-CM | POA: Diagnosis not present

## 2018-05-01 DIAGNOSIS — M79676 Pain in unspecified toe(s): Secondary | ICD-10-CM

## 2018-05-01 DIAGNOSIS — E1159 Type 2 diabetes mellitus with other circulatory complications: Secondary | ICD-10-CM

## 2018-05-01 DIAGNOSIS — B351 Tinea unguium: Secondary | ICD-10-CM | POA: Diagnosis not present

## 2018-05-01 DIAGNOSIS — L97522 Non-pressure chronic ulcer of other part of left foot with fat layer exposed: Secondary | ICD-10-CM

## 2018-05-01 NOTE — Progress Notes (Signed)
Subjective: 84 year old female presents the office today for concerns of left foot pain.  She states that she twisted her ankle about 4 to 5 weeks ago she had some swelling and pain to the foot.  She had no recent treatment for this.  She has been under the care of Dr. Prudence Davidson for wound on the left foot as well.  He states that she recently has had "artery surgery" last week.  She is on doxycycline for 6 weeks for the ulceration. Denies any systemic complaints such as fevers, chills, nausea, vomiting. No acute changes since last appointment, and no other complaints at this time.   Objective: AAO x3, NAD DP/PT pulses palpable bilaterally, CRT less than 3 seconds There is edema to the left foot.  There is mild tenderness to palpation of the dorsal midfoot.  There is tenderness of the fifth metatarsal base but overall the peroneal tendon appears to be intact.  Minimal discomfort in the medial aspect of the foot.  Achilles tendon appears to be intact.  Ulceration submetatarsal 2 on the left foot.  There is a hyperkeratotic lesion overlying the area after debridement the wound appears clean the larger 1 x 1 cm.  There is no probing to bone, undermining or tunneling.  There is no drainage or pus there is no fluctuation or crepitation. No open lesions or pre-ulcerative lesions.  No pain with calf compression, swelling, warmth, erythema  Assessment: Left foot injury, chronic ulceration with osteomyelitis left foot  Plan: -All treatment options discussed with the patient including all alternatives, risks, complications.  -X-rays were obtained and reviewed.  Fracture present of the first metatarsal base with mild displacement.  Also cortical changes of the second metatarsal head consistent with osteomyelitis. -Unusual fracture pattern to the first metatarsal base.  Concern for infection versus Charcot clinically there is no signs of infection of this area foot.  In regards to recent injury on her to remain in  the cam boot she came in with.  Limit weightbearing as possible and elevation.  Compression to help with the swelling. -Sharply debrided the wound without any complications or bleeding.  Continue antibiotics per Dr. Prudence Davidson.  The cam boot will help with this as well -Follow-up in 1 week or sooner if needed -Patient encouraged to call the office with any questions, concerns, change in symptoms.   Trula Slade DPM  I reviewed the culture from 03/13/2018 and was growing MRSA, resistant to tetracycline. She is on doxycycline that was started after the wound culture and she was on PCN prior to this. Her antibiotics likely need to be changed. Very concerned about having osteomyelitis on x-ray and the long term nature of this wound.

## 2018-05-01 NOTE — Progress Notes (Signed)
HPI: 84 year old female presents the office today for multiple complaints regarding the left lower extremity.  Patient was recently diagnosed with multiple fractures of the left foot.  She also has an ulceration of the plantar aspect of the left foot subsecond MTPJ has been ongoing for greater than 1 year.  Patient also complains of symptomatic painful thickened dystrophic nails to the bilateral feet 1-5.  Patient has been weightbearing in the immobilization cam boot to the left lower extremity.  She is also been applying meta honey with dry sterile dressing to the ulceration site and she says that it is significantly improved.  Past Medical History:  Diagnosis Date  . Anxiety   . CAD (coronary artery disease)   . Cellulitis 10/2015   LEFT FOOT  . Complication of anesthesia   . Coronary artery disease   . Diabetes mellitus without complication (HCC)    insulin dependent  . GERD (gastroesophageal reflux disease)   . Hypertension   . Hypothyroidism   . Neuromuscular disorder (HCC)    muscle cramps to lower extremities  . Other primary cardiomyopathies   . PONV (postoperative nausea and vomiting)   . Shortness of breath      Physical Exam: General: The patient is alert and oriented x3 in no acute distress.  Dermatology: Ulcer noted to the plantar aspect of the second MTPJ left foot measuring approximately 0.3 x 0.3 x 0.5 cm.  Hyperkeratotic dystrophic nails also noted 1-5 bilateral consistent with onychomycosis.  Vascular: Nonpalpable pedal pulses to the left lower extremity.  Moderate edema throughout the foot ankle and leg left lower extremity.  Patient recently underwent revascularization procedure by Dr. [name], [facility], on 04/16/2018.  Revascularization of the left lower extremity appears to be somewhat successful  Neurological: Epicritic and protective threshold absent bilaterally.   Musculoskeletal Exam: Range of motion within normal limits to all  pedal and ankle joints bilateral. Muscle strength 5/5 in all groups bilateral.   Radiographic Exam 04/23/2018: Cortical erosion with destruction of the second metatarsal head noted on AP view radiographic exam.  There is also cortical destruction with comminution and fracture of the base of the first metatarsal at the metatarsal cuneiform joint.  No malalignment noted.  Concerning for possible osteomyelitis given the underlying second MTPJ ulceration which is chronic in nature.   Assessment: 1.  Pain due to onychomycosis of toenail bilateral 1-5 2.  Ulcer left foot secondary to diabetes mellitus 3.  Peripheral vascular disease 4.  Combination fracture second metatarsal head, first metatarsal base concerning for possible osteomyelitis, stable, chronic   Plan of Care:  1. Patient evaluated. X-Rays reviewed that were taken on 04/23/2018.  2.  Medically necessary excisional debridement including subcutaneous tissue was performed using a tissue nipper.  Excisional debridement of all the necrotic nonviable tissue down to healthy bleeding viable tissue was performed with post debridement measurement same as pre-. 3.  Mechanical debridement of nails 1-5 bilateral was performed using a nail nipper without incident or bleeding. 4.  Continue weightbearing in the immobilization cam boot. 5.  Continue oral antibiotics the patient is currently taking 6.  Continue meta honey application to the ulceration site daily. 7.  The patient may benefit from surgical excision of the necrotic bone, especially noted to the second metatarsal head which is concerning for osteomyelitis. 8.  Return to clinic in 3 weeks for management of the ulceration site.  Recommend new x-rays in 6 weeks.  If there is degenerative changes patient  will likely require surgical excision of the necrotic portion of bone.        Edrick Kins, DPM [facility]  Dr. Edrick Kins, DPM    [address], [place] [address]                Office [phone]  Fax [phone]

## 2018-05-02 ENCOUNTER — Other Ambulatory Visit: Payer: Self-pay | Admitting: Podiatry

## 2018-05-02 DIAGNOSIS — E1159 Type 2 diabetes mellitus with other circulatory complications: Secondary | ICD-10-CM

## 2018-05-02 DIAGNOSIS — L97522 Non-pressure chronic ulcer of other part of left foot with fat layer exposed: Secondary | ICD-10-CM

## 2018-05-02 DIAGNOSIS — I10 Essential (primary) hypertension: Secondary | ICD-10-CM

## 2018-05-02 MED ORDER — CIPROFLOXACIN HCL 500 MG PO TABS: 500.0000 mg | ORAL_TABLET | Freq: Two times a day (BID) | ORAL | 0 refills | Status: DC

## 2018-05-02 NOTE — Telephone Encounter (Signed)
I spoke pt she states Dr. Amalia Hailey had wanted her to remain on the Doxycycline. I spoke with Dr. Ledell Noss to the wound culture showed resistant to tetracycline. I called pt and explained I would call again with orders. Dr. Jacqualyn Posey states continue the doxycycline and add cipro 500mg  #20 one tablet bid, appt with Dr. Amalia Hailey, and labs - Sed Rate, CRP,. CBC with Diff, Q7R, Basic Metabolic panel. Pt states she has an appt with Dr. Amalia Hailey in 3 weeks and doesn't know why she needs to come in earlier. I told pt this was Dr. Leigh Aurora recommendation and if she wanted to keep the three week appt, she could call us to schedule an earlier appt if necessary. Pt states she is to have labs at Dr. Lang Snow office in November. I told pt we could order our labs to be drawn at Dr. Raul Del, because we're wanting to watch her kidney function and sugar. Pt agreed.

## 2018-05-20 ENCOUNTER — Ambulatory Visit: Payer: PPO | Admitting: Podiatry

## 2018-05-20 ENCOUNTER — Ambulatory Visit (INDEPENDENT_AMBULATORY_CARE_PROVIDER_SITE_OTHER): Payer: PPO

## 2018-05-20 DIAGNOSIS — L97522 Non-pressure chronic ulcer of other part of left foot with fat layer exposed: Secondary | ICD-10-CM

## 2018-05-20 DIAGNOSIS — S92302G Fracture of unspecified metatarsal bone(s), left foot, subsequent encounter for fracture with delayed healing: Secondary | ICD-10-CM | POA: Diagnosis not present

## 2018-05-20 DIAGNOSIS — I70245 Atherosclerosis of native arteries of left leg with ulceration of other part of foot: Secondary | ICD-10-CM | POA: Diagnosis not present

## 2018-05-20 DIAGNOSIS — E1159 Type 2 diabetes mellitus with other circulatory complications: Secondary | ICD-10-CM

## 2018-05-20 NOTE — Progress Notes (Signed)
HPI: 84 year old female presents the office today for multiple complaints regarding the left lower extremity.  Patient was recently diagnosed with multiple fractures of the left foot.  She also has an ulceration of the plantar aspect of the left foot subsecond MTPJ has been ongoing for greater than 1 year.  Patient has been applying Medihoney to the ulceration site and she says that it is improved significantly.  Patient has been wearing the immobilization cam boot however she says that it is very heavy and she is unable to ambulate very well with it.  Causes instability.  She is not happy wearing the cam boot.  Patient was also seen by a different physician who changed her antibiotic prescription to ciprofloxacin 500 mg twice daily.  She presents for further treatment evaluation  Past Medical History:  Diagnosis Date  . Anxiety   . CAD (coronary artery disease)   . Cellulitis 10/2015   LEFT FOOT  . Complication of anesthesia   . Coronary artery disease   . Diabetes mellitus without complication (HCC)    insulin dependent  . GERD (gastroesophageal reflux disease)   . Hypertension   . Hypothyroidism   . Neuromuscular disorder (HCC)    muscle cramps to lower extremities  . Other primary cardiomyopathies   . PONV (postoperative nausea and vomiting)   . Shortness of breath      Physical Exam: General: The patient is alert and oriented x3 in no acute distress.  Dermatology: Ulcer noted to the plantar aspect of the second MTPJ left foot measuring approximately 0.2 x 0.2 x 0.3 cm.  There is significant improvement of the ulceration site.  Wound base is red and granular.  Periwound integrity intact.  No erythema or edema noted.  Vascular: Nonpalpable pedal pulses to the left lower extremity.  Moderate edema throughout the foot ankle and leg left lower extremity.  Patient recently underwent revascularization procedure by Dr. [name], [facility], on 04/16/2018.   Revascularization of the left lower extremity appears to be somewhat successful  Neurological: Epicritic and protective threshold absent bilaterally.   Musculoskeletal Exam: Range of motion within normal limits to all pedal and ankle joints bilateral. Muscle strength 5/5 in all groups bilateral.  Hammertoe contracture noted 1-5 bilateral  Radiographic Exam: X-rays mostly unchanged from prior imaging on 04/23/2018.  There does appear to be some healing of the fracture fragment to the base the first metatarsal.  No change noted to the second MTPJ.   Assessment: 1.  Ulcer left foot secondary to diabetes mellitus 3.  Peripheral vascular disease 4.  Fracture base of the first metatarsal with routine healing 5.  Osteolytic changes second MTPJ possibly due to osteomyelitis -currently stable/chronic 6.  Hammertoes 1-5 bilateral 7.  Diabetes mellitus with polyneuropathy  Plan of Care:  1. Patient evaluated. X-Rays reviewed 2.  Medically necessary excisional debridement including subcutaneous tissue was performed using a tissue nipper.  Excisional debridement of all the necrotic nonviable tissue down to healthy bleeding viable tissue was performed with post debridement measurement same as pre-. 3.  Today the patient is doing very well and there are no signs of active/acute infection based on clinical exam and radiographic exam. 4.  Continue Medihoney to ulceration daily with a Band-Aid 5.  Discontinue immobilization cam boot.  Postoperative shoe was dispensed.  Weightbearing as tolerated. 6.  We will continue to monitor and observe the osteolytic portions on x-ray.  Return to clinic in 6 weeks for follow-up x-ray 7.  Today we can make an appointment with Benjie Karvonen for diabetic shoes and insoles      Edrick Kins, DPM [facility]  Dr. Edrick Kins, DPM    [address], [place] [address]                Office [phone]  Fax  [phone]

## 2018-05-27 ENCOUNTER — Ambulatory Visit (INDEPENDENT_AMBULATORY_CARE_PROVIDER_SITE_OTHER)
Admission: RE | Admit: 2018-05-27 | Discharge: 2018-05-27 | Disposition: A | Discharge status: Home / Self Care | Payer: PPO | Source: Ambulatory Visit | Attending: Internal Medicine | Admitting: Internal Medicine

## 2018-05-27 ENCOUNTER — Ambulatory Visit: Payer: PPO | Admitting: Surgery

## 2018-05-27 ENCOUNTER — Ambulatory Visit (HOSPITAL_COMMUNITY)
Admission: RE | Admit: 2018-05-27 | Discharge: 2018-05-27 | Disposition: A | Discharge status: Home / Self Care | Payer: PPO | Source: Ambulatory Visit | Attending: Internal Medicine | Admitting: Internal Medicine

## 2018-05-27 ENCOUNTER — Other Ambulatory Visit: Payer: Self-pay

## 2018-05-27 ENCOUNTER — Encounter: Payer: Self-pay | Admitting: Surgery

## 2018-05-27 VITALS — BP 156/78 | HR 91 | Temp 97.4°F | Resp 16 | Ht 68.0 in | Wt 167.0 lb

## 2018-05-27 DIAGNOSIS — I7025 Atherosclerosis of native arteries of other extremities with ulceration: Secondary | ICD-10-CM | POA: Diagnosis not present

## 2018-05-27 NOTE — Progress Notes (Signed)
[facility]  Patient name: [name] MRN: [id number] DOB: [birth year] Sex: female   REASON FOR VISIT:    Follow up  HISOTRY OF PRESENT ILLNESS:    [name] is a 84 y.o. female who I had seen several years ago for a nonhealing wound on her left foot.  She had come to me having had angiography by Dr. Fletcher Anon.  She was found to have 2 mild to moderate superficial femoral-popliteal lesions and single-vessel runoff via the peroneal.  No intervention was performed.  The last time I saw her, she was not interested in intervention.  She did not follow-up last year.  Patient has seen Dr. Sharol Given in the past 2 has undergone debridements and immobilization however the wound is not healed.  She is now seeing podiatry and is using meta honey.  Ultrasound study suggested severe peripheral vascular disease.  On 04/16/2018 she underwent angiography.  A 80% origin stenosis of the peroneal artery was successfully treated with atherectomy and angioplasty.  Tandem 60-70% lesions within the superficial femoral artery on the left were treated with drug-eluting balloon angioplasty.  I also perform recanalization of an occluded left anterior tibial artery and subsequent balloon angioplasty.  Patient suffers from coronary artery disease she is undergone PCI in the past.  The patient is a diabetic.  She is on statin for hypercholesterolemia and takes an ACE inhibitor for hypertension.   PAST MEDICAL HISTORY:   Past Medical History:  Diagnosis Date  . Anxiety   . CAD (coronary artery disease)   . Cellulitis 10/2015   LEFT FOOT  . Complication of anesthesia   . Coronary artery disease   . Diabetes mellitus without complication (HCC)    insulin dependent  . GERD (gastroesophageal reflux disease)   . Hypertension   . Hypothyroidism   . Neuromuscular disorder (HCC)    muscle cramps to lower extremities  . Other primary cardiomyopathies   .  PONV (postoperative nausea and vomiting)   . Shortness of breath      FAMILY HISTORY:   Family History  Problem Relation Age of Onset  . Heart disease Father   . Heart attack Father   . Diabetes Sister   . Heart disease Son        before age 62  . Heart attack 76        84yr old  . Sudden death Grandchild   . Hypertension Neg Hx     SOCIAL HISTORY:   Social History   Tobacco Use  . Smoking status: Former Smoker    Last attempt to quit: 11/28/1960    Years since quitting: 57.5  . Smokeless tobacco: Never Used  Substance Use Topics  . Alcohol use: Yes    Alcohol/week: 0.0 standard drinks    Comment: rare     ALLERGIES:   Allergies  Allergen Reactions  . Sulfa Antibiotics Nausea And Vomiting    Severe vomiting.      CURRENT MEDICATIONS:   Current Outpatient Medications  Medication Sig Dispense Refill  . amLODipine (NORVASC) 10 MG tablet Take 1 tablet (10 mg total) by mouth every afternoon (Patient taking differently: Take 10 mg by mouth daily. Take 1 tablet (10 mg total) by mouth every afternoo) 90 tablet 1  . aspirin EC 325 MG tablet Take 325 mg by mouth 4 (four) times a week. At night    . aspirin EC 81 MG tablet Take 1 tablet (81 mg total) by  mouth daily. 90 tablet 3  . atorvastatin (LIPITOR) 20 MG tablet Take 1 tablet (20 mg total) by mouth daily. 90 tablet 0  . Biotin 5000 MCG CAPS Take 5,000 mcg by mouth daily.    . carvedilol (COREG) 25 MG tablet Take 1 tablet (25 mg total) by mouth 2 (two) times daily with a meal. *Please call and schedule an appointment with Dr Fletcher Anon* 60 tablet 0  . cholecalciferol (VITAMIN D) 1000 units tablet Take 2,000 Units by mouth daily.     . ciprofloxacin (CIPRO) 500 MG tablet Take 1 tablet (500 mg total) by mouth 2 (two) times daily. 20 tablet 0  . clopidogrel (PLAVIX) 75 MG tablet Take 1 tablet (75 mg total) by mouth daily. 30 tablet 11  . Coenzyme Q10 (COQ-10) 200 MG CAPS Take 200 mg by mouth daily.    Marland Kitchen doxycycline  (VIBRA-TABS) 100 MG tablet Take 1 tablet (100 mg total) by mouth 2 (two) times daily. 20 tablet 0  . LEVEMIR FLEXTOUCH 100 UNIT/ML Pen Inject 70 Units into the skin at bedtime.     Marland Kitchen lisinopril (PRINIVIL,ZESTRIL) 20 MG tablet Take 20 mg by mouth every evening.     . nitroGLYCERIN (NITROSTAT) 0.4 MG SL tablet PLACE 1 TABLET UNDER THE TONGUE EVERY 5 MINUTES AS NEEDED FOR CHEST PAIN (Patient taking differently: Place 0.4 mg under the tongue every 5 (five) minutes as needed for chest pain. ) 25 tablet 1  . NOVOLOG FLEXPEN 100 UNIT/ML FlexPen Inject 20 Units into the skin 3 (three) times daily before meals.     . pantoprazole (PROTONIX) 40 MG tablet TAKE 1 TABLET BY MOUTH ONCE DAILY AT  6AM (Patient taking differently: Take 40 mg by mouth daily before breakfast. ) 90 tablet 2  . Vitamin D, Ergocalciferol, (DRISDOL) 50000 units CAPS capsule Take 50,000 Units by mouth every Friday.   11  . Wound Dressings (MEDIHONEY WOUND/BURN DRESSING) GEL Apply 1 application topically daily.    Marland Kitchen doxycycline (VIBRAMYCIN) 100 MG capsule TAKE 1 CAPSULE BY MOUTH TWICE DAILY (Patient not taking: Reported on 05/27/2018) 28 capsule 0   No current facility-administered medications for this visit.     REVIEW OF SYSTEMS:   [X]  denotes positive finding, [ ]  denotes negative finding Cardiac  Comments:  Chest pain or chest pressure:    Shortness of breath upon exertion:    Short of breath when lying flat:    Irregular heart rhythm:        Vascular    Pain in calf, thigh, or hip brought on by ambulation:    Pain in feet at night that wakes you up from your sleep:     Blood clot in your veins:    Leg swelling:         Pulmonary    Oxygen at home:    Productive cough:     Wheezing:         Neurologic    Sudden weakness in arms or legs:     Sudden numbness in arms or legs:     Sudden onset of difficulty speaking or slurred speech:    Temporary loss of vision in one eye:     Problems with dizziness:           Gastrointestinal    Blood in stool:     Vomited blood:         Genitourinary    Burning when urinating:     Blood in urine:  Psychiatric    Major depression:         Hematologic    Bleeding problems:    Problems with blood clotting too easily:        Skin    Rashes or ulcers: x       Constitutional    Fever or chills:      PHYSICAL EXAM:   Vitals:   05/27/18 1537  BP: (!) 156/78  Pulse: 91  Resp: 16  Temp: (!) 97.4 F (36.3 C)  TempSrc: Oral  SpO2: 96%  Weight: 167 lb (75.8 kg)  Height: 5\' 8"  (1.727 m)    GENERAL: The patient is a well-nourished female, in no acute distress. The vital signs are documented above. CARDIAC: There is a regular rate and rhythm.  VASCULAR: Nonpalpable pedal pulses PULMONARY: Non-labored respirations MUSCULOSKELETAL: There are no major deformities or cyanosis. NEUROLOGIC: No focal weakness or paresthesias are detected. SKIN: There are no ulcers or rashes noted. PSYCHIATRIC: The patient has a normal affect.  STUDIES:   I have ordered and reviewed her vascular lab studies with the following findings:  Left: 30-49% stenosis noted in the superficial femoral artery. Left popliteal fossa fluid collection: 4.07 x 1.26 x 2.52 cm.  +-------+-----------+-----------+------------+------------+ ABI/TBIToday's ABIToday's TBIPrevious ABIPrevious TBI +-------+-----------+-----------+------------+------------+ Right 0.52    0.43    0.66    0.27     +-------+-----------+-----------+------------+------------+ Left  0.93    0.59    0.59    0.12     +-------+-----------+-----------+------------+------------+  Left toe pressure equals 96 Right toe pressure equals 70  MEDICAL ISSUES:   Left foot ulcer: This appears to be decreasing in size.  Unfortunately she is developed some fractures on her foot that are being managed in a boot.  Vascular lab studies today show significant improvement in  her blood flow.  Hopefully this will help heal her wound.  She will come back in 3 months for repeat studies.    [name], MD [facility] [phone] Pager [phone]

## 2018-05-28 ENCOUNTER — Other Ambulatory Visit: Payer: PPO | Admitting: Orthotics

## 2018-05-28 DIAGNOSIS — I10 Essential (primary) hypertension: Secondary | ICD-10-CM | POA: Diagnosis not present

## 2018-05-30 DIAGNOSIS — R82998 Other abnormal findings in urine: Secondary | ICD-10-CM | POA: Diagnosis not present

## 2018-05-30 DIAGNOSIS — I10 Essential (primary) hypertension: Secondary | ICD-10-CM | POA: Diagnosis not present

## 2018-06-04 DIAGNOSIS — I7389 Other specified peripheral vascular diseases: Secondary | ICD-10-CM | POA: Diagnosis not present

## 2018-06-04 DIAGNOSIS — Z1389 Encounter for screening for other disorder: Secondary | ICD-10-CM | POA: Diagnosis not present

## 2018-06-04 DIAGNOSIS — I428 Other cardiomyopathies: Secondary | ICD-10-CM | POA: Diagnosis not present

## 2018-06-04 DIAGNOSIS — I10 Essential (primary) hypertension: Secondary | ICD-10-CM | POA: Diagnosis not present

## 2018-06-04 DIAGNOSIS — E11621 Type 2 diabetes mellitus with foot ulcer: Secondary | ICD-10-CM | POA: Diagnosis not present

## 2018-06-04 DIAGNOSIS — Z6829 Body mass index (BMI) 29.0-29.9, adult: Secondary | ICD-10-CM | POA: Diagnosis not present

## 2018-06-04 DIAGNOSIS — E1140 Type 2 diabetes mellitus with diabetic neuropathy, unspecified: Secondary | ICD-10-CM | POA: Diagnosis not present

## 2018-06-04 DIAGNOSIS — Z0000 Encounter for general adult medical examination without abnormal findings: Secondary | ICD-10-CM | POA: Diagnosis not present

## 2018-06-04 DIAGNOSIS — N182 Chronic kidney disease, stage 2 (mild): Secondary | ICD-10-CM | POA: Diagnosis not present

## 2018-06-04 DIAGNOSIS — E7849 Other hyperlipidemia: Secondary | ICD-10-CM | POA: Diagnosis not present

## 2018-06-04 DIAGNOSIS — Z9861 Coronary angioplasty status: Secondary | ICD-10-CM | POA: Diagnosis not present

## 2018-06-04 DIAGNOSIS — E048 Other specified nontoxic goiter: Secondary | ICD-10-CM | POA: Diagnosis not present

## 2018-06-05 ENCOUNTER — Telehealth (HOSPITAL_COMMUNITY): Payer: Self-pay | Admitting: *Deleted

## 2018-06-05 NOTE — Telephone Encounter (Signed)
06/05/2018 left message asking for return call to schedule carotid duplex, referral from Dr. Marton Redwood to our lab.

## 2018-06-06 ENCOUNTER — Telehealth: Payer: Self-pay | Admitting: Podiatry

## 2018-06-06 NOTE — Telephone Encounter (Signed)
Left message for pt to call with status of her foot wound.

## 2018-06-06 NOTE — Telephone Encounter (Signed)
Pt wanted to know if she needs a refill on her prescription of Anti-Biotics. Please give her a call

## 2018-06-07 ENCOUNTER — Telehealth: Payer: Self-pay | Admitting: *Deleted

## 2018-06-07 NOTE — Telephone Encounter (Signed)
Pt states she is returning my call from 06/06/2018, and she states her foot feels fine, but the real concern is the possibility of getting an infection in the bone, the last time Dr. Amalia Hailey saw it he said it looked fine, but she is not sure how to proceed.

## 2018-06-10 ENCOUNTER — Other Ambulatory Visit: Payer: Self-pay

## 2018-06-10 NOTE — Telephone Encounter (Signed)
Dr. Amalia Hailey stated there is no active/acute infection at the moment, no antibiotic at the moment. I contacted pt she states she is in the car and request a call back this afternoon.

## 2018-06-10 NOTE — Telephone Encounter (Signed)
I spoke with pt and gave her Dr. Amalia Hailey orders.

## 2018-06-12 ENCOUNTER — Other Ambulatory Visit (HOSPITAL_COMMUNITY): Payer: Self-pay | Admitting: Internal Medicine

## 2018-06-12 DIAGNOSIS — R0989 Other specified symptoms and signs involving the circulatory and respiratory systems: Secondary | ICD-10-CM

## 2018-06-17 ENCOUNTER — Ambulatory Visit (HOSPITAL_COMMUNITY)
Admission: RE | Admit: 2018-06-17 | Discharge: 2018-06-17 | Disposition: A | Discharge status: Home / Self Care | Payer: PPO | Source: Ambulatory Visit | Attending: Surgery | Admitting: Surgery

## 2018-06-17 DIAGNOSIS — R0989 Other specified symptoms and signs involving the circulatory and respiratory systems: Secondary | ICD-10-CM | POA: Insufficient documentation

## 2018-07-01 ENCOUNTER — Ambulatory Visit: Payer: PPO | Admitting: Podiatry

## 2018-07-01 ENCOUNTER — Ambulatory Visit (INDEPENDENT_AMBULATORY_CARE_PROVIDER_SITE_OTHER): Payer: PPO | Admitting: Orthotics

## 2018-07-01 ENCOUNTER — Ambulatory Visit (INDEPENDENT_AMBULATORY_CARE_PROVIDER_SITE_OTHER): Payer: PPO

## 2018-07-01 ENCOUNTER — Encounter: Payer: Self-pay | Admitting: Podiatry

## 2018-07-01 DIAGNOSIS — L03119 Cellulitis of unspecified part of limb: Secondary | ICD-10-CM

## 2018-07-01 DIAGNOSIS — L97522 Non-pressure chronic ulcer of other part of left foot with fat layer exposed: Secondary | ICD-10-CM

## 2018-07-01 DIAGNOSIS — I70245 Atherosclerosis of native arteries of left leg with ulceration of other part of foot: Secondary | ICD-10-CM

## 2018-07-01 DIAGNOSIS — L02619 Cutaneous abscess of unspecified foot: Secondary | ICD-10-CM

## 2018-07-01 DIAGNOSIS — S92302G Fracture of unspecified metatarsal bone(s), left foot, subsequent encounter for fracture with delayed healing: Secondary | ICD-10-CM

## 2018-07-01 DIAGNOSIS — E1159 Type 2 diabetes mellitus with other circulatory complications: Secondary | ICD-10-CM

## 2018-07-01 DIAGNOSIS — L97521 Non-pressure chronic ulcer of other part of left foot limited to breakdown of skin: Secondary | ICD-10-CM

## 2018-07-01 NOTE — Progress Notes (Signed)

## 2018-07-02 ENCOUNTER — Telehealth: Payer: Self-pay | Admitting: Orthotics

## 2018-07-02 NOTE — Telephone Encounter (Signed)
Called patient to advise that f/o could not be modified w/o charge due that she got them in April 2018.  Advised that I could put some padding on them here in the office, but I could not re-fabricate them.

## 2018-07-04 NOTE — Progress Notes (Signed)
   HPI: 84 year old female with PMHx of DM presents the office today for follow up evaluation of an ulceration of the left foot. She states the wound has improved significantly and has healed. She has been applying Medi Honey to the area for treatment. She denies modifying factors. Patient is here for further evaluation and treatment.   Past Medical History:  Diagnosis Date  . Anxiety   . CAD (coronary artery disease)   . Cellulitis 10/2015   LEFT FOOT  . Complication of anesthesia   . Coronary artery disease   . Diabetes mellitus without complication (HCC)    insulin dependent  . GERD (gastroesophageal reflux disease)   . Hypertension   . Hypothyroidism   . Neuromuscular disorder (HCC)    muscle cramps to lower extremities  . Other primary cardiomyopathies   . PONV (postoperative nausea and vomiting)   . Shortness of breath      Physical Exam: General: The patient is alert and oriented x3 in no acute distress.  Dermatology: Wound noted to the plantar aspect of the second MTPJ left foot has healed. Complete re-epithelialization has occurred. No drainage noted.   Vascular: Nonpalpable pedal pulses to the left lower extremity.  Moderate edema throughout the foot ankle and leg left lower extremity.  Patient recently underwent revascularization procedure by Dr. [name], [facility], on 04/16/2018.  Revascularization of the left lower extremity appears to be somewhat successful  Neurological: Epicritic and protective threshold absent bilaterally.   Musculoskeletal Exam: Range of motion within normal limits to all pedal and ankle joints bilateral. Muscle strength 5/5 in all groups bilateral. Hammertoe contracture deformity noted to digits 1-5 of the bilateral feet.   Radiographic Exam: Stable with evidence of routine healing. Likely not due to osteomyelitis.    Assessment: 1.  Ulcer left foot secondary to diabetes mellitus - healed 2.  Peripheral vascular  disease 3.  Fracture base of the first metatarsal - resolved 4.  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  Hammertoes 1-5 bilateral 6.  Diabetes mellitus with polyneuropathy  Plan of Care:  1. Patient evaluated. X-Rays reviewed 2. Diabetic shoes dispensed today by Liliane Channel, Pedorthist.  3. Return to clinic in 3 months for routine care.       Edrick Kins, DPM [facility]  Dr. Edrick Kins, DPM    [address], [place] [address]                Office [phone]  Fax [phone]

## 2018-07-22 ENCOUNTER — Telehealth: Payer: Self-pay | Admitting: Orthotics

## 2018-07-22 NOTE — Telephone Encounter (Signed)
Called to ask what she wants to do with the f/o I have; can add padding here in the office and advised the need for an appointment.

## 2018-07-24 DIAGNOSIS — Z794 Long term (current) use of insulin: Secondary | ICD-10-CM | POA: Diagnosis not present

## 2018-07-24 DIAGNOSIS — Z961 Presence of intraocular lens: Secondary | ICD-10-CM | POA: Diagnosis not present

## 2018-07-24 DIAGNOSIS — H5203 Hypermetropia, bilateral: Secondary | ICD-10-CM | POA: Diagnosis not present

## 2018-07-24 DIAGNOSIS — E119 Type 2 diabetes mellitus without complications: Secondary | ICD-10-CM | POA: Diagnosis not present

## 2018-07-24 DIAGNOSIS — H52203 Unspecified astigmatism, bilateral: Secondary | ICD-10-CM | POA: Diagnosis not present

## 2018-07-24 DIAGNOSIS — H524 Presbyopia: Secondary | ICD-10-CM | POA: Diagnosis not present

## 2018-08-21 ENCOUNTER — Other Ambulatory Visit: Payer: Self-pay

## 2018-08-21 DIAGNOSIS — I7025 Atherosclerosis of native arteries of other extremities with ulceration: Secondary | ICD-10-CM

## 2018-08-26 ENCOUNTER — Encounter: Payer: Self-pay | Admitting: Podiatry

## 2018-08-26 ENCOUNTER — Ambulatory Visit: Payer: PPO | Admitting: Podiatry

## 2018-08-26 DIAGNOSIS — B351 Tinea unguium: Secondary | ICD-10-CM

## 2018-08-26 DIAGNOSIS — L989 Disorder of the skin and subcutaneous tissue, unspecified: Secondary | ICD-10-CM | POA: Diagnosis not present

## 2018-08-26 DIAGNOSIS — E1159 Type 2 diabetes mellitus with other circulatory complications: Secondary | ICD-10-CM

## 2018-08-26 DIAGNOSIS — L97522 Non-pressure chronic ulcer of other part of left foot with fat layer exposed: Secondary | ICD-10-CM | POA: Diagnosis not present

## 2018-08-26 DIAGNOSIS — I70245 Atherosclerosis of native arteries of left leg with ulceration of other part of foot: Secondary | ICD-10-CM

## 2018-08-26 DIAGNOSIS — M79676 Pain in unspecified toe(s): Secondary | ICD-10-CM | POA: Diagnosis not present

## 2018-08-27 ENCOUNTER — Ambulatory Visit (INDEPENDENT_AMBULATORY_CARE_PROVIDER_SITE_OTHER): Payer: HMO | Admitting: Physician Assistant

## 2018-08-27 ENCOUNTER — Other Ambulatory Visit: Payer: Self-pay

## 2018-08-27 ENCOUNTER — Ambulatory Visit: Payer: PPO | Admitting: Family

## 2018-08-27 ENCOUNTER — Ambulatory Visit (HOSPITAL_COMMUNITY)
Admission: RE | Admit: 2018-08-27 | Discharge: 2018-08-27 | Disposition: A | Discharge status: Home / Self Care | Payer: HMO | Source: Ambulatory Visit | Attending: Vascular Surgery | Admitting: Vascular Surgery

## 2018-08-27 ENCOUNTER — Ambulatory Visit (INDEPENDENT_AMBULATORY_CARE_PROVIDER_SITE_OTHER)
Admission: RE | Admit: 2018-08-27 | Discharge: 2018-08-27 | Disposition: A | Discharge status: Home / Self Care | Payer: HMO | Source: Ambulatory Visit | Attending: Vascular Surgery | Admitting: Vascular Surgery

## 2018-08-27 ENCOUNTER — Encounter: Payer: Self-pay | Admitting: Physician Assistant

## 2018-08-27 VITALS — BP 161/77 | HR 78 | Temp 97.0°F | Resp 18 | Ht 68.0 in | Wt 167.0 lb

## 2018-08-27 DIAGNOSIS — L97521 Non-pressure chronic ulcer of other part of left foot limited to breakdown of skin: Secondary | ICD-10-CM

## 2018-08-27 DIAGNOSIS — I7025 Atherosclerosis of native arteries of other extremities with ulceration: Secondary | ICD-10-CM

## 2018-08-27 DIAGNOSIS — I739 Peripheral vascular disease, unspecified: Secondary | ICD-10-CM | POA: Diagnosis not present

## 2018-08-27 NOTE — Progress Notes (Signed)
Established Critical Limb Ischemia Patient   History of Present Illness   [name] is a 85 y.o. (1931-10-26) female who presents with PAD with new diabetic ulcer of her L plantar surface of foot.  She is s/p drug-coated balloon angioplasty of left SFA as well as atherectomy of peroneal artery and balloon angioplasty with recannulation of left anterior tibial artery by Dr. Trula Slade on 04/16/2018.  She was able to heal the ulceration of her left foot after revascularization.  She however states she has a new wound on the plantar surface of her left foot.  She receives wound care from podiatrist Dr. Amalia Hailey.  She is taking her aspirin and Plavix daily.  She is also taking a statin daily.  She denies tobacco use.  She is ambulatory with a cane however admits she does not walk much as she was instructed to avoid pressure to this area of her foot.   Current Outpatient Medications  Medication Sig Dispense Refill  . amLODipine (NORVASC) 10 MG tablet Take 1 tablet (10 mg total) by mouth every afternoon (Patient taking differently: Take 10 mg by mouth daily. Take 1 tablet (10 mg total) by mouth every afternoo) 90 tablet 1  . aspirin EC 325 MG tablet Take 325 mg by mouth 4 (four) times a week. At night    . aspirin EC 81 MG tablet Take 1 tablet (81 mg total) by mouth daily. 90 tablet 3  . atorvastatin (LIPITOR) 20 MG tablet Take 1 tablet (20 mg total) by mouth daily. 90 tablet 0  . Biotin 5000 MCG CAPS Take 5,000 mcg by mouth daily.    . carvedilol (COREG) 25 MG tablet Take 1 tablet (25 mg total) by mouth 2 (two) times daily with a meal. *Please call and schedule an appointment with Dr Fletcher Anon* 60 tablet 0  . cholecalciferol (VITAMIN D) 1000 units tablet Take 2,000 Units by mouth daily.     . ciprofloxacin (CIPRO) 500 MG tablet Take 1 tablet (500 mg total) by mouth 2 (two) times daily. 20 tablet 0  . clopidogrel (PLAVIX) 75 MG tablet Take 1 tablet (75 mg total) by mouth daily. 30 tablet 11  . Coenzyme  Q10 (COQ-10) 200 MG CAPS Take 200 mg by mouth daily.    Marland Kitchen doxycycline (VIBRA-TABS) 100 MG tablet Take 1 tablet (100 mg total) by mouth 2 (two) times daily. 20 tablet 0  . doxycycline (VIBRAMYCIN) 100 MG capsule TAKE 1 CAPSULE BY MOUTH TWICE DAILY 28 capsule 0  . LEVEMIR FLEXTOUCH 100 UNIT/ML Pen Inject 70 Units into the skin at bedtime.     Marland Kitchen lisinopril (PRINIVIL,ZESTRIL) 20 MG tablet Take 20 mg by mouth every evening.     . nitroGLYCERIN (NITROSTAT) 0.4 MG SL tablet PLACE 1 TABLET UNDER THE TONGUE EVERY 5 MINUTES AS NEEDED FOR CHEST PAIN (Patient taking differently: Place 0.4 mg under the tongue every 5 (five) minutes as needed for chest pain. ) 25 tablet 1  . NOVOLOG FLEXPEN 100 UNIT/ML FlexPen Inject 20 Units into the skin 3 (three) times daily before meals.     . pantoprazole (PROTONIX) 40 MG tablet TAKE 1 TABLET BY MOUTH ONCE DAILY AT  6AM (Patient taking differently: Take 40 mg by mouth daily before breakfast. ) 90 tablet 2  . Vitamin D, Ergocalciferol, (DRISDOL) 50000 units CAPS capsule Take 50,000 Units by mouth every Friday.   11  . Wound Dressings (MEDIHONEY WOUND/BURN DRESSING) GEL Apply 1 application topically daily.  No current facility-administered medications for this visit.     On ROS today: 10 system ROS is negative unless otherwise noted in HPI   Physical Examination   Vitals:   08/27/18 1313  BP: (!) 161/77  Pulse: 78  Resp: 18  Temp: (!) 97 F (36.1 C)  TempSrc: Oral  SpO2: 98%  Weight: 167 lb (75.8 kg)  Height: '5\' 8"'$  (1.727 m)   Body mass index is 25.39 kg/m.  General Alert, O x 3, WD, NAD  Pulmonary Sym exp, good B air movt, CTA B  Cardiac RRR, Nl S1, S2  Vascular Vessel Right Left  Radial Palpable Palpable  Aorta Not palpable N/A  Femoral Palpable Palpable  Popliteal Not palpable Not palpable  PT Not palpable Not palpable  DP Not palpable Not palpable    Gastro- intestinal soft, non-distended, non-tender to palpation,  Musculo- skeletal M/S  5/5 throughout  , Several varicosities noted left shin and medial thigh; plantar surface of left foot fourth toe base with large callus and central ulceration dry without drainage  Neurologic Cranial nerves 2-12 intact    Non-Invasive Vascular Imaging   ABI (08/27/18)  ABI/TBIToday's ABIToday's TBIPrevious ABIPrevious TBI +-------+-----------+-----------+------------+------------+ Right  0.64       0.50       0.52        0.43         +-------+-----------+-----------+------------+------------+ Left   0.74       0.53       0.93        0.59         LLE arterial duplex  A focal velocity elevation of 399 cm/s was obtained at proximal SFA with post stenotic turbulence with a VR of 4.8. Findings are characteristic of 75-99% stenosis. A 2nd focal velocity elevation was visualized, measuring 216 cm/s at mid SFA with a VR of  1.51. Findings are characteristic of 50-74% stenosis  Medical Decision Making   [name] is a 85 y.o. female who presents with new ulceration L foot and recurrent stenosis of L SFA based on duplex    Left lower extremity arterial duplex demonstrates recurrent stenosis of left SFA as well as a decrease in left ABI  Patient also has a new ulceration on the plantar surface of her left foot  Plan will be for repeat arteriogram with possible intervention of left lower extremity  She will continue her aspirin and Plavix up to and through the time of surgery  Left foot wound was re-dressed I discussed with the patient the nature of angiographic procedures   The patient is aware of that the risks and agrees to proceed    Dagoberto Ligas PA-C [facility] Office: [phone]  Clinic MD: Dr. Carlis Abbott was involved in providing treatment plan for this patient given noninvasive study results in the presence of a new L foot wound

## 2018-08-27 NOTE — H&P (View-Only) (Signed)
Established Critical Limb Ischemia Patient   History of Present Illness   [name] is a 85 y.o. ([birth year]) female who presents with PAD with new diabetic ulcer of her L plantar surface of foot.  She is s/p drug-coated balloon angioplasty of left SFA as well as atherectomy of peroneal artery and balloon angioplasty with recannulation of left anterior tibial artery by Dr. Trula Slade on 04/16/2018.  She was able to heal the ulceration of her left foot after revascularization.  She however states she has a new wound on the plantar surface of her left foot.  She receives wound care from podiatrist Dr. Amalia Hailey.  She is taking her aspirin and Plavix daily.  She is also taking a statin daily.  She denies tobacco use.  She is ambulatory with a cane however admits she does not walk much as she was instructed to avoid pressure to this area of her foot.   Current Outpatient Medications  Medication Sig Dispense Refill  . amLODipine (NORVASC) 10 MG tablet Take 1 tablet (10 mg total) by mouth every afternoon (Patient taking differently: Take 10 mg by mouth daily. Take 1 tablet (10 mg total) by mouth every afternoo) 90 tablet 1  . aspirin EC 325 MG tablet Take 325 mg by mouth 4 (four) times a week. At night    . aspirin EC 81 MG tablet Take 1 tablet (81 mg total) by mouth daily. 90 tablet 3  . atorvastatin (LIPITOR) 20 MG tablet Take 1 tablet (20 mg total) by mouth daily. 90 tablet 0  . Biotin 5000 MCG CAPS Take 5,000 mcg by mouth daily.    . carvedilol (COREG) 25 MG tablet Take 1 tablet (25 mg total) by mouth 2 (two) times daily with a meal. *Please call and schedule an appointment with Dr Fletcher Anon* 60 tablet 0  . cholecalciferol (VITAMIN D) 1000 units tablet Take 2,000 Units by mouth daily.     . ciprofloxacin (CIPRO) 500 MG tablet Take 1 tablet (500 mg total) by mouth 2 (two) times daily. 20 tablet 0  . clopidogrel (PLAVIX) 75 MG tablet Take 1 tablet (75 mg total) by mouth daily. 30 tablet 11  . Coenzyme  Q10 (COQ-10) 200 MG CAPS Take 200 mg by mouth daily.    Marland Kitchen doxycycline (VIBRA-TABS) 100 MG tablet Take 1 tablet (100 mg total) by mouth 2 (two) times daily. 20 tablet 0  . doxycycline (VIBRAMYCIN) 100 MG capsule TAKE 1 CAPSULE BY MOUTH TWICE DAILY 28 capsule 0  . LEVEMIR FLEXTOUCH 100 UNIT/ML Pen Inject 70 Units into the skin at bedtime.     Marland Kitchen lisinopril (PRINIVIL,ZESTRIL) 20 MG tablet Take 20 mg by mouth every evening.     . nitroGLYCERIN (NITROSTAT) 0.4 MG SL tablet PLACE 1 TABLET UNDER THE TONGUE EVERY 5 MINUTES AS NEEDED FOR CHEST PAIN (Patient taking differently: Place 0.4 mg under the tongue every 5 (five) minutes as needed for chest pain. ) 25 tablet 1  . NOVOLOG FLEXPEN 100 UNIT/ML FlexPen Inject 20 Units into the skin 3 (three) times daily before meals.     . pantoprazole (PROTONIX) 40 MG tablet TAKE 1 TABLET BY MOUTH ONCE DAILY AT  6AM (Patient taking differently: Take 40 mg by mouth daily before breakfast. ) 90 tablet 2  . Vitamin D, Ergocalciferol, (DRISDOL) 50000 units CAPS capsule Take 50,000 Units by mouth every Friday.   11  . Wound Dressings (MEDIHONEY WOUND/BURN DRESSING) GEL Apply 1 application topically daily.  No current facility-administered medications for this visit.     On ROS today: 10 system ROS is negative unless otherwise noted in HPI   Physical Examination   Vitals:   08/27/18 1313  BP: (!) 161/77  Pulse: 78  Resp: 18  Temp: (!) 97 F (36.1 C)  TempSrc: Oral  SpO2: 98%  Weight: 167 lb (75.8 kg)  Height: '5\' 8"'$  (1.727 m)   Body mass index is 25.39 kg/m.  General Alert, O x 3, WD, NAD  Pulmonary Sym exp, good B air movt, CTA B  Cardiac RRR, Nl S1, S2  Vascular Vessel Right Left  Radial Palpable Palpable  Aorta Not palpable N/A  Femoral Palpable Palpable  Popliteal Not palpable Not palpable  PT Not palpable Not palpable  DP Not palpable Not palpable    Gastro- intestinal soft, non-distended, non-tender to palpation,  Musculo- skeletal M/S  5/5 throughout  , Several varicosities noted left shin and medial thigh; plantar surface of left foot fourth toe base with large callus and central ulceration dry without drainage  Neurologic Cranial nerves 2-12 intact    Non-Invasive Vascular Imaging   ABI (08/27/18)  ABI/TBIToday's ABIToday's TBIPrevious ABIPrevious TBI +-------+-----------+-----------+------------+------------+ Right  0.64       0.50       0.52        0.43         +-------+-----------+-----------+------------+------------+ Left   0.74       0.53       0.93        0.59         LLE arterial duplex  A focal velocity elevation of 399 cm/s was obtained at proximal SFA with post stenotic turbulence with a VR of 4.8. Findings are characteristic of 75-99% stenosis. A 2nd focal velocity elevation was visualized, measuring 216 cm/s at mid SFA with a VR of  1.51. Findings are characteristic of 50-74% stenosis  Medical Decision Making   [name] is a 85 y.o. female who presents with new ulceration L foot and recurrent stenosis of L SFA based on duplex    Left lower extremity arterial duplex demonstrates recurrent stenosis of left SFA as well as a decrease in left ABI  Patient also has a new ulceration on the plantar surface of her left foot  Plan will be for repeat arteriogram with possible intervention of left lower extremity  She will continue her aspirin and Plavix up to and through the time of surgery  Left foot wound was re-dressed I discussed with the patient the nature of angiographic procedures   The patient is aware of that the risks and agrees to proceed    Dagoberto Ligas PA-C [facility] Office: [phone]  Clinic MD: Dr. Carlis Abbott was involved in providing treatment plan for this patient given noninvasive study results in the presence of a new L foot wound

## 2018-08-28 DIAGNOSIS — M859 Disorder of bone density and structure, unspecified: Secondary | ICD-10-CM | POA: Diagnosis not present

## 2018-08-29 ENCOUNTER — Other Ambulatory Visit: Payer: Self-pay | Admitting: *Deleted

## 2018-08-30 NOTE — Progress Notes (Signed)
SUBJECTIVE 85 year old female with PMHx of DM presenting today for follow up evaluation of an ulceration of the left foot. She also notes a painful callus lesion on the right fourth toe that appeared a few weeks ago. She states the ulceration is doing ok. She has been using Medi Honey for treatment. There are no modifying factors noted.  She also complains of elongated, thickened nails that cause pain while ambulating in shoes. She is unable to trim her own nails. Patient is here for further evaluation and treatment.   Past Medical History:  Diagnosis Date  . Anxiety   . CAD (coronary artery disease)   . Cellulitis 10/2015   LEFT FOOT  . Complication of anesthesia   . Coronary artery disease   . Diabetes mellitus without complication (HCC)    insulin dependent  . GERD (gastroesophageal reflux disease)   . Hypertension   . Hypothyroidism   . Neuromuscular disorder (HCC)    muscle cramps to lower extremities  . Other primary cardiomyopathies   . PONV (postoperative nausea and vomiting)   . Shortness of breath     OBJECTIVE General Patient is awake, alert, and oriented x 3 and in no acute distress.  Derm Wound #1 noted to the left forefoot measuring 0.6 x 0.6 x 0.2 cm.   To the above-noted ulceration, there is no eschar. There is a moderate amount of slough, fibrin and necrotic tissue. Granulation tissue and wound base is red. There is no malodor. There is a minimal amount of serosanginous drainage noted. Periwound integrity is intact. Skin is dry and supple bilateral.  Nails are tender, long, thickened and dystrophic with subungual debris, consistent with onychomycosis, 1-5 bilateral. No signs of infection noted. Hyperkeratotic lesion present on the right fourth toe. Pain on palpation with a central nucleated core noted.   Vasc  DP and PT pedal pulses palpable bilaterally. Temperature gradient within normal limits.   Neuro Epicritic and protective threshold sensation diminished  bilaterally.   Musculoskeletal Exam No symptomatic pedal deformities noted bilateral. Muscular strength within normal limits.  ASSESSMENT 1. Diabetes Mellitus w/ peripheral neuropathy 2. Onychomycosis of nail due to dermatophyte bilateral 3. Ulceration of the left forefoot secondary to diabetes mellitus 4. Callus right 4th toe  PLAN OF CARE 1. Patient evaluated today. 2. Instructed to maintain good pedal hygiene and foot care. Stressed importance of controlling blood sugar.  3. Mechanical debridement of nails 1-5 bilaterally performed using a nail nipper. Filed with dremel without incident.  4. Medically necessary excisional debridement including subcutaneous tissue was performed using a tissue nipper and a chisel blade. Excisional debridement of all the necrotic nonviable tissue down to healthy bleeding viable tissue was performed with post-debridement measurements same as pre-. 5. The wound was cleansed and dry sterile dressing applied. 6. Continue using Medi Honey daily with a dry sterile dressing.  7. Excisional debridement of keratotic lesion using a chisel blade was performed without incident. Light dressing applied.  8. Return to clinic in 3 weeks.    Edrick Kins, DPM [facility]  Dr. Edrick Kins, [address]                                        [employer], [place] [address]                Office [phone]  [phone]  Fax [phone]

## 2018-09-03 ENCOUNTER — Encounter (HOSPITAL_COMMUNITY): Payer: Self-pay | Admitting: Surgery

## 2018-09-03 ENCOUNTER — Encounter (HOSPITAL_COMMUNITY)
Admission: RE | Disposition: A | Discharge status: Home / Self Care | Payer: Self-pay | Source: Ambulatory Visit | Attending: Surgery

## 2018-09-03 ENCOUNTER — Other Ambulatory Visit: Payer: Self-pay

## 2018-09-03 ENCOUNTER — Ambulatory Visit (HOSPITAL_COMMUNITY)
Admission: RE | Admit: 2018-09-03 | Discharge: 2018-09-03 | Disposition: A | Discharge status: Home / Self Care | Payer: HMO | Source: Ambulatory Visit | Attending: Surgery | Admitting: Surgery

## 2018-09-03 DIAGNOSIS — E11621 Type 2 diabetes mellitus with foot ulcer: Secondary | ICD-10-CM | POA: Insufficient documentation

## 2018-09-03 DIAGNOSIS — Z7982 Long term (current) use of aspirin: Secondary | ICD-10-CM | POA: Insufficient documentation

## 2018-09-03 DIAGNOSIS — Z7902 Long term (current) use of antithrombotics/antiplatelets: Secondary | ICD-10-CM | POA: Diagnosis not present

## 2018-09-03 DIAGNOSIS — Z794 Long term (current) use of insulin: Secondary | ICD-10-CM | POA: Diagnosis not present

## 2018-09-03 DIAGNOSIS — Z79899 Other long term (current) drug therapy: Secondary | ICD-10-CM | POA: Diagnosis not present

## 2018-09-03 DIAGNOSIS — I70245 Atherosclerosis of native arteries of left leg with ulceration of other part of foot: Secondary | ICD-10-CM | POA: Insufficient documentation

## 2018-09-03 DIAGNOSIS — I701 Atherosclerosis of renal artery: Secondary | ICD-10-CM | POA: Insufficient documentation

## 2018-09-03 DIAGNOSIS — L97529 Non-pressure chronic ulcer of other part of left foot with unspecified severity: Secondary | ICD-10-CM | POA: Insufficient documentation

## 2018-09-03 DIAGNOSIS — I70248 Atherosclerosis of native arteries of left leg with ulceration of other part of lower left leg: Secondary | ICD-10-CM | POA: Diagnosis not present

## 2018-09-03 HISTORY — PX: PERIPHERAL VASCULAR BALLOON ANGIOPLASTY: CATH118281

## 2018-09-03 HISTORY — PX: ABDOMINAL AORTOGRAM: CATH118222

## 2018-09-03 HISTORY — PX: PERIPHERAL VASCULAR INTERVENTION: CATH118257

## 2018-09-03 LAB — POCT I-STAT 4, (NA,K, GLUC, HGB,HCT)
Glucose, Bld: 226 mg/dL — ABNORMAL HIGH (ref 70–99)
HCT: 36 % (ref 36.0–46.0)
Hemoglobin: 12.2 g/dL (ref 12.0–15.0)
Potassium: 4.7 mmol/L (ref 3.5–5.1)
Sodium: 137 mmol/L (ref 135–145)

## 2018-09-03 LAB — POCT I-STAT CREATININE: Creatinine, Ser: 0.5 mg/dL (ref 0.44–1.00)

## 2018-09-03 LAB — POCT ACTIVATED CLOTTING TIME: Activated Clotting Time: 202 seconds

## 2018-09-03 SURGERY — ABDOMINAL AORTOGRAM
Anesthesia: LOCAL

## 2018-09-03 MED ORDER — SODIUM CHLORIDE 0.9% FLUSH: 3.0000 mL | Freq: Two times a day (BID) | INTRAVENOUS | Status: DC

## 2018-09-03 MED ORDER — MIDAZOLAM HCL 2 MG/2ML IJ SOLN
INTRAMUSCULAR | Status: AC
  Filled 2018-09-03: qty 2

## 2018-09-03 MED ORDER — SODIUM CHLORIDE 0.9 % IV SOLN
INTRAVENOUS | Status: DC
  Administered 2018-09-03: 09:00:00 via INTRAVENOUS

## 2018-09-03 MED ORDER — ONDANSETRON HCL 4 MG/2ML IJ SOLN: 4.0000 mg | Freq: Four times a day (QID) | INTRAMUSCULAR | Status: DC | PRN

## 2018-09-03 MED ORDER — HEPARIN (PORCINE) IN NACL 1000-0.9 UT/500ML-% IV SOLN
INTRAVENOUS | Status: AC
  Filled 2018-09-03: qty 500

## 2018-09-03 MED ORDER — ACETAMINOPHEN 325 MG PO TABS: 650.0000 mg | ORAL_TABLET | ORAL | Status: DC | PRN

## 2018-09-03 MED ORDER — LIDOCAINE HCL (PF) 1 % IJ SOLN
INTRAMUSCULAR | Status: AC
  Filled 2018-09-03: qty 30

## 2018-09-03 MED ORDER — FENTANYL CITRATE (PF) 100 MCG/2ML IJ SOLN
INTRAMUSCULAR | Status: DC | PRN
  Administered 2018-09-03: 50 ug via INTRAVENOUS

## 2018-09-03 MED ORDER — MORPHINE SULFATE (PF) 2 MG/ML IV SOLN: 1.0000 mg | INTRAVENOUS | Status: DC | PRN

## 2018-09-03 MED ORDER — LIDOCAINE HCL (PF) 1 % IJ SOLN
INTRAMUSCULAR | Status: DC | PRN
  Administered 2018-09-03: 18 mL

## 2018-09-03 MED ORDER — OXYCODONE HCL 5 MG PO TABS: 5.0000 mg | ORAL_TABLET | ORAL | Status: DC | PRN

## 2018-09-03 MED ORDER — SODIUM CHLORIDE 0.9 % WEIGHT BASED INFUSION: 1.0000 mL/kg/h | INTRAVENOUS | Status: DC

## 2018-09-03 MED ORDER — MIDAZOLAM HCL 2 MG/2ML IJ SOLN
INTRAMUSCULAR | Status: DC | PRN
  Administered 2018-09-03: 2 mg via INTRAVENOUS

## 2018-09-03 MED ORDER — SODIUM CHLORIDE 0.9 % IV SOLN: 250.0000 mL | INTRAVENOUS | Status: DC | PRN

## 2018-09-03 MED ORDER — HEPARIN SODIUM (PORCINE) 1000 UNIT/ML IJ SOLN
INTRAMUSCULAR | Status: AC
  Filled 2018-09-03: qty 1

## 2018-09-03 MED ORDER — FENTANYL CITRATE (PF) 100 MCG/2ML IJ SOLN
INTRAMUSCULAR | Status: AC
  Filled 2018-09-03: qty 2

## 2018-09-03 MED ORDER — HEPARIN SODIUM (PORCINE) 1000 UNIT/ML IJ SOLN
INTRAMUSCULAR | Status: DC | PRN
  Administered 2018-09-03: 8000 [IU] via INTRAVENOUS
  Administered 2018-09-03: 2000 [IU] via INTRAVENOUS

## 2018-09-03 MED ORDER — IODIXANOL 320 MG/ML IV SOLN
INTRAVENOUS | Status: DC | PRN
  Administered 2018-09-03: 130 mL via INTRAVENOUS

## 2018-09-03 MED ORDER — SODIUM CHLORIDE 0.9% FLUSH: 3.0000 mL | INTRAVENOUS | Status: DC | PRN

## 2018-09-03 MED ORDER — HYDRALAZINE HCL 20 MG/ML IJ SOLN: 5.0000 mg | INTRAMUSCULAR | Status: DC | PRN

## 2018-09-03 MED ORDER — LABETALOL HCL 5 MG/ML IV SOLN: 10.0000 mg | INTRAVENOUS | Status: DC | PRN

## 2018-09-03 MED ORDER — HEPARIN (PORCINE) IN NACL 1000-0.9 UT/500ML-% IV SOLN
INTRAVENOUS | Status: DC | PRN
  Administered 2018-09-03 (×2): 500 mL

## 2018-09-03 SURGICAL SUPPLY — 28 items
BALLN MUSTANG 6.0X40 135 (BALLOONS) ×3
BALLN STERLING OTW 3X60X150 (BALLOONS) ×3
BALLOON MUSTANG 6.0X40 135 (BALLOONS) IMPLANT
BALLOON STERLING OTW 3X60X150 (BALLOONS) IMPLANT
CATH OMNI FLUSH 5F 65CM (CATHETERS) ×1 IMPLANT
CATH QUICKCROSS SUPP .035X90CM (MICROCATHETER) ×1 IMPLANT
CLOSURE MYNX CONTROL 6F/7F (Vascular Products) ×1 IMPLANT
DEVICE TORQUE H2O (MISCELLANEOUS) ×1 IMPLANT
DRAPE ZERO GRAVITY STERILE (DRAPES) ×1 IMPLANT
GUIDEWIRE ANGLED .035X150CM (WIRE) ×1 IMPLANT
KIT ENCORE 26 ADVANTAGE (KITS) ×1 IMPLANT
KIT MICROPUNCTURE NIT STIFF (SHEATH) ×1 IMPLANT
KIT PV (KITS) ×3 IMPLANT
SHEATH PINNACLE 5F 10CM (SHEATH) ×1 IMPLANT
SHEATH PINNACLE 6F 10CM (SHEATH) ×1 IMPLANT
SHEATH PINNACLE ST 6F 65CM (SHEATH) ×1 IMPLANT
SHEATH PROBE COVER 6X72 (BAG) ×1 IMPLANT
STENT ELUVIA 7X40X130 (Permanent Stent) ×1 IMPLANT
SYR MEDRAD MARK V 150ML (SYRINGE) ×1 IMPLANT
TAPE VIPERTRACK RADIOPAQ (MISCELLANEOUS) IMPLANT
TAPE VIPERTRACK RADIOPAQUE (MISCELLANEOUS) ×3
TRANSDUCER W/STOPCOCK (MISCELLANEOUS) ×3 IMPLANT
TRAY PV CATH (CUSTOM PROCEDURE TRAY) ×3 IMPLANT
WIRE AMPLATZ SS-J .035X180CM (WIRE) ×1 IMPLANT
WIRE BENTSON .035X145CM (WIRE) ×1 IMPLANT
WIRE G V18X300CM (WIRE) ×1 IMPLANT
WIRE HI TORQ VERSACORE J 260CM (WIRE) ×1 IMPLANT
WIRE ROSEN-J .035X260CM (WIRE) ×1 IMPLANT

## 2018-09-03 NOTE — Discharge Instructions (Signed)

## 2018-09-03 NOTE — Interval H&P Note (Signed)
History and Physical Interval Note:  09/03/2018 8:46 AM  [name]  has presented today for surgery, with the diagnosis of pvd  The various methods of treatment have been discussed with the patient and family. After consideration of risks, benefits and other options for treatment, the patient has consented to  Procedure(s): ABDOMINAL AORTOGRAM W/LOWER EXTREMITY (Left) as a surgical intervention .  The patient's history has been reviewed, patient examined, no change in status, stable for surgery.  I have reviewed the patient's chart and labs.  Questions were answered to the patient's satisfaction.     [name]

## 2018-09-03 NOTE — Op Note (Signed)
Patient name: [name] MRN: [id number] DOB: 04/27/1932 Sex: female  09/03/2018 Pre-operative Diagnosis: Left leg ulcer Post-operative diagnosis:  Same Surgeon:  [name] Procedure Performed:  1.  Ultrasound-guided access, right femoral artery  2.  Abdominal aortogram  3.  Left lower extremity runoff  4.  Stent, left superficial femoral artery  5.  Balloon angioplasty, left tibioperoneal trunk and peroneal artery  6.  Conscious sedation (67 minutes)  7.  Closure device (Mynx)    Indications: Patient has a history of a healed left foot ulcer following lower extremity intervention.  She has a new ulcer today comes in for further evaluation and possible intervention.  Procedure:  The patient was identified in the holding area and taken to room 8.  The patient was then placed supine on the table and prepped and draped in the usual sterile fashion.  A time out was called.  Conscious sedation was administered with use of IV fentanyl and Versed under continuous physician and nurse monitoring.  Heart, blood pressure, and oxygen saturation were continuously monitored.Marland Kitchen  Ultrasound was used to evaluate the right common femoral artery.  It was patent .  A digital ultrasound image was acquired.  A micropuncture needle was used to access the right common femoral artery under ultrasound guidance.  An 018 wire was advanced without resistance and a micropuncture sheath was placed.  The 018 wire was removed and a benson wire was placed.  The micropuncture sheath was exchanged for a 5 french sheath.  An omniflush catheter was advanced over the wire to the level of L-1.  An abdominal angiogram was obtained.  Next, using the omniflush catheter and a benson wire, the aortic bifurcation was crossed and the catheter was placed into theleft external iliac artery and left runoff was obtained.   Findings:   Aortogram: No significant infrarenal aortic stenosis identified.  Bilateral common and external iliac  arteries widely patent.  60% left renal artery stenosis.  No significant right renal artery stenosis.  Right Lower Extremity: Not evaluated  Left Lower Extremity: The left common femoral and profundofemoral artery are widely patent.  The superficial femoral artery is patent however in the proximal third of the artery there is approximately an 85% 2 cm in length lesion.  The remaining portion of the superficial femoral-popliteal artery are widely patent.  The anterior tibial artery is the dominant runoff was is patent across the ankle.  There is a 70% stenosis within the origin of the tibioperoneal trunk and in the peroneal artery.  Intervention: After the above images were acquired the decision was made to proceed with intervention.  Over a Rosen wire, a 6 French 65 cm sheath was advanced into the origin of the left superficial femoral artery.  The patient was fully heparinized.  I then used a versa core wire to cross the lesion in the superficial femoral artery which I elected to primarily stent using a 7 x 40 Elluvia drug-eluting stent which was postdilated with a 6 x 40 balloon.  Postintervention stenosis was 0%.  Next, I used a V-18 wire supported by 3 x 60 Sterling balloon to select the origin of the tibioperoneal trunk.  The balloon was advanced to the origin of the tibioperoneal trunk extending into the peroneal artery and primary balloon angioplasty was performed using the 3 x 60 Sterling balloon for 2 minutes.  Follow-up imaging revealed resolution of the stenosis.  There was a small area at the origin of the tibioperoneal trunk  that was not treated.  I was reluctant to re-intervene at this level due to concerns of possibly affecting blood flow to the anterior tibial artery.  Runoff was not evaluated.  There was excellent perfusion of the foot.  Catheters and wires were removed at this time.  The long 6 French sheath was exchanged out for a short sheath and the groin was closed with a minx  device.  Impression:  #1 60% left renal artery stenosis  #2 85% proximal left superficial femoral artery stented using a 7 x 40 Elluvia drug-eluting stent with no residual stenosis  #3 70% stenosis within the tibioperoneal trunk and peroneal artery treated with a 3 mm balloon angioplasty with no residual stenosis.     Theotis Burrow, M.D. [facility] Office: [phone] Pager:  [phone]

## 2018-09-04 ENCOUNTER — Encounter (HOSPITAL_COMMUNITY): Payer: Self-pay | Admitting: Surgery

## 2018-09-06 ENCOUNTER — Telehealth: Payer: Self-pay | Admitting: Surgery

## 2018-09-06 NOTE — Telephone Encounter (Signed)
-----   Message from Mena Goes, RN sent at 09/03/2018 10:59 AM EST ----- Regarding: 1 months with labs  ----- Message ----- From: Serafina Mitchell, MD Sent: 09/03/2018  10:48 AM EST To: [facility] Charge Pool  09/03/2018:  Surgeon:  [name] Procedure Performed:  1.  Ultrasound-guided access, right femoral artery  2.  Abdominal aortogram  3.  Left lower extremity runoff  4.  Stent, left superficial femoral artery  5.  Balloon angioplasty, left tibioperoneal trunk and peroneal artery  6.  Conscious sedation (67 minutes)  7.  Closure device (Mynx)    Follow-up PA/NP clinic in 1 month with ABIs and duplex of left leg

## 2018-09-06 NOTE — Telephone Encounter (Signed)
sch appt spk to pt mld ltr 10/01/2018 12pm ABI 1pm LLE art 115pm p/o NP

## 2018-09-16 ENCOUNTER — Ambulatory Visit (INDEPENDENT_AMBULATORY_CARE_PROVIDER_SITE_OTHER): Payer: HMO

## 2018-09-16 ENCOUNTER — Encounter: Payer: Self-pay | Admitting: Podiatry

## 2018-09-16 ENCOUNTER — Ambulatory Visit: Payer: HMO | Admitting: Podiatry

## 2018-09-16 ENCOUNTER — Other Ambulatory Visit: Payer: Self-pay | Admitting: Interventional Cardiology

## 2018-09-16 DIAGNOSIS — L97522 Non-pressure chronic ulcer of other part of left foot with fat layer exposed: Secondary | ICD-10-CM

## 2018-09-16 DIAGNOSIS — E1159 Type 2 diabetes mellitus with other circulatory complications: Secondary | ICD-10-CM | POA: Diagnosis not present

## 2018-09-16 DIAGNOSIS — M869 Osteomyelitis, unspecified: Secondary | ICD-10-CM | POA: Diagnosis not present

## 2018-09-16 DIAGNOSIS — I739 Peripheral vascular disease, unspecified: Secondary | ICD-10-CM

## 2018-09-16 MED ORDER — DOXYCYCLINE HYCLATE 100 MG PO TABS: 100.0000 mg | ORAL_TABLET | Freq: Two times a day (BID) | ORAL | 1 refills | Status: DC

## 2018-09-16 NOTE — Patient Instructions (Signed)
Pre-Operative Instructions  Congratulations, you have decided to take an important step towards improving your quality of life.  You can be assured that the doctors and staff at [facility] will be with you every step of the way.  Here are some important things you should know:  1. Plan to be at the surgery center/hospital at least 1 (one) hour prior to your scheduled time, unless otherwise directed by the surgical center/hospital staff.  You must have a responsible adult accompany you, remain during the surgery and drive you home.  Make sure you have directions to the surgical center/hospital to ensure you arrive on time. 2. If you are having surgery at [facility] or [facility], you will need a copy of your medical history and physical form from your family physician within one month prior to the date of surgery. We will give you a form for your primary physician to complete.  3. We make every effort to accommodate the date you request for surgery.  However, there are times where surgery dates or times have to be moved.  We will contact you as soon as possible if a change in schedule is required.   4. No aspirin/ibuprofen for one week before surgery.  If you are on aspirin, any non-steroidal anti-inflammatory medications (Mobic, Aleve, Ibuprofen) should not be taken seven (7) days prior to your surgery.  You make take Tylenol for pain prior to surgery.  5. Medications - If you are taking daily heart and blood pressure medications, seizure, reflux, allergy, asthma, anxiety, pain or diabetes medications, make sure you notify the surgery center/hospital before the day of surgery so they can tell you which medications you should take or avoid the day of surgery. 6. No food or drink after midnight the night before surgery unless directed otherwise by surgical center/hospital staff. 7. No alcoholic beverages 24-hours prior to surgery.  No smoking 24-hours prior or 24-hours after  surgery. 8. Wear loose pants or shorts. They should be loose enough to fit over bandages, boots, and casts. 9. Don't wear slip-on shoes. Sneakers are preferred. 10. Bring your boot with you to the surgery center/hospital.  Also bring crutches or a walker if your physician has prescribed it for you.  If you do not have this equipment, it will be provided for you after surgery. 11. If you have not been contacted by the surgery center/hospital by the day before your surgery, call to confirm the date and time of your surgery. 12. Leave-time from work may vary depending on the type of surgery you have.  Appropriate arrangements should be made prior to surgery with your employer. 13. Prescriptions will be provided immediately following surgery by your doctor.  Fill these as soon as possible after surgery and take the medication as directed. Pain medications will not be refilled on weekends and must be approved by the doctor. 14. Remove nail polish on the operative foot and avoid getting pedicures prior to surgery. 15. Wash the night before surgery.  The night before surgery wash the foot and leg well with water and the antibacterial soap provided. Be sure to pay special attention to beneath the toenails and in between the toes.  Wash for at least three (3) minutes. Rinse thoroughly with water and dry well with a towel.  Perform this wash unless told not to do so by your physician.  Enclosed: 1 Ice pack (please put in freezer the night before surgery)   1 Hibiclens skin cleaner     Pre-op instructions  If you have any questions regarding the instructions, please do not hesitate to call our office.  [place]: [address], [place], [place] [address] -- [phone]  [place]: [address] [place], [place] [address] -- [phone]  [place]: [address]  [place], [place] [address] -- [phone]  [place]: [address], [address], [place],  [address] -- [phone]  Website:  [email or web address] 

## 2018-09-17 ENCOUNTER — Telehealth: Payer: Self-pay | Admitting: *Deleted

## 2018-09-17 ENCOUNTER — Encounter: Payer: Self-pay | Admitting: *Deleted

## 2018-09-17 NOTE — Progress Notes (Signed)
Per Dr. Amalia Hailey, I sent a surgical medical clearance request letter to Dr. Brigitte Pulse.

## 2018-09-17 NOTE — Telephone Encounter (Signed)
I am calling to see if we can reschedule your surgery to March 12 instead of March 11.  "I actually want to push my surgery back a couple of weeks.  My husband has Dementia and I have to make arrangements for him.  I'm almost considering not having the surgery at all and just take antibiotics to clear it up.  I'll discuss that with Dr. Amalia Hailey.  So can he do it on March 26?"  He does not have any time available on that date but he can do it on March 25 at lunch time.  "Okay, put me down for then."

## 2018-09-19 LAB — WOUND CULTURE
MICRO NUMBER:: 264984
SPECIMEN QUALITY:: ADEQUATE

## 2018-09-21 NOTE — Progress Notes (Signed)
SUBJECTIVE 85 year old female with PMHx of DM presenting today for follow up evaluation of an ulceration of the left foot.  Patient has been applying medihoney with dry sterile dressings daily.  The wound is been present for several months now without any permanent healing.  Patient denies any significant pain and she thinks it is improving with the meta honey.  Past Medical History:  Diagnosis Date  . Anxiety   . CAD (coronary artery disease)   . Cellulitis 10/2015   LEFT FOOT  . Complication of anesthesia   . Coronary artery disease   . Diabetes mellitus without complication (HCC)    insulin dependent  . GERD (gastroesophageal reflux disease)   . Hypertension   . Hypothyroidism   . Neuromuscular disorder (HCC)    muscle cramps to lower extremities  . Other primary cardiomyopathies   . PONV (postoperative nausea and vomiting)   . Shortness of breath     OBJECTIVE General Patient is awake, alert, and oriented x 3 and in no acute distress.  Derm Wound #1 noted to the left forefoot measuring 0.6 x 0.4 x 0.5 cm.   To the above-noted ulceration, there is no eschar. There is a moderate amount of slough, fibrin and necrotic tissue. Granulation tissue and wound base is red. There is no malodor. There is a minimal amount of serosanginous drainage noted. Periwound integrity is intact. Skin is dry and supple bilateral.  Nails are tender, long, thickened and dystrophic with subungual debris, consistent with onychomycosis, 1-5 bilateral. No signs of acute infection noted. Hyperkeratotic lesion present on the right fourth toe. Pain on palpation with a central nucleated core noted.   Vasc  DP and PT pedal pulses palpable bilaterally. Temperature gradient within normal limits.   Neuro Epicritic and protective threshold sensation diminished bilaterally.   Musculoskeletal Exam No symptomatic pedal deformities noted bilateral. Muscular strength within normal limits.  Radiographic exam  radiographic exam taken today compared to prior exam on 07/01/2018 indicate acute degenerative changes of the third MTPJ foot.  Cortical erosion with joint space destruction noted consistent with osteomyelitis.  All other joints are preserved with no cortical destruction noted anywhere else.  ASSESSMENT 1. Diabetes Mellitus w/ peripheral neuropathy 2. Ulceration of the left forefoot secondary to diabetes mellitus 4.  Osteomyelitis third MTPJ left foot  PLAN OF CARE 1. Patient evaluated today. 2. Instructed to maintain good pedal hygiene and foot care. Stressed importance of controlling blood sugar.  3. Medically necessary excisional debridement including subcutaneous tissue was performed using a tissue nipper and a chisel blade. Excisional debridement of all the necrotic nonviable tissue down to healthy bleeding viable tissue was performed with post-debridement measurements same as pre-. 5. The wound was cleansed and dry sterile dressing applied. 6. Continue using Medi Honey daily with a dry sterile dressing.  7.  Today explained to the patient that she will require surgery due to the acute findings of osteomyelitis within the left foot.  All possible complications and details of the procedure were explained.  No guarantees were expressed or implied.  All patient questions answered. 8.  Authorization for surgery was initiated today.  Surgery will consist of third MTPJ arthroplasty left 9.  Return to clinic 1 week postop  Edrick Kins, DPM [facility]  Dr. Edrick Kins, [place]  [place], [place] [address]                Office [phone]  Fax [phone]

## 2018-09-26 ENCOUNTER — Telehealth: Payer: Self-pay | Admitting: *Deleted

## 2018-09-26 NOTE — Telephone Encounter (Signed)
"  The nurse said that [name] wants to reschedule her surgery that's scheduled for March 25."  I'll give her a call.  I attempted to call the patient.  I left her a message asking her to call me back.

## 2018-09-27 ENCOUNTER — Other Ambulatory Visit: Payer: Self-pay

## 2018-09-27 DIAGNOSIS — I7025 Atherosclerosis of native arteries of other extremities with ulceration: Secondary | ICD-10-CM

## 2018-09-27 DIAGNOSIS — I739 Peripheral vascular disease, unspecified: Secondary | ICD-10-CM

## 2018-09-27 NOTE — Telephone Encounter (Signed)
I received a message that you wanted to reschedule your surgery.  "Well I don't really want to, I just have questions.  I just recently had surgery on my leg.  I'm scheduled to see my vein doctor next week.  I need to ask him if I need to stop my Plavix.  I need to see if it's okay for me to have this surgery so soon after the other surgery.  I need to know what type of anesthesia I'll be receiving.  Do you know by chance?"  I do not know what anesthesia Dr. Amalia Hailey will be using.  "I don't necessarily want to cancel but go ahead and cancel it for now.  I'll call you back when I get all my questions answered and I'll reschedule it."  I canceled the surgery via [employer].

## 2018-09-30 ENCOUNTER — Ambulatory Visit: Payer: PPO | Admitting: Podiatry

## 2018-09-30 NOTE — Telephone Encounter (Signed)
Anesthesia will most likely be IV sedation, not general.  Dr. Amalia Hailey

## 2018-10-01 ENCOUNTER — Ambulatory Visit (INDEPENDENT_AMBULATORY_CARE_PROVIDER_SITE_OTHER): Payer: Self-pay | Admitting: Family

## 2018-10-01 ENCOUNTER — Encounter: Payer: Self-pay | Admitting: Family

## 2018-10-01 ENCOUNTER — Ambulatory Visit (HOSPITAL_COMMUNITY)
Admission: RE | Admit: 2018-10-01 | Discharge: 2018-10-01 | Disposition: A | Discharge status: Home / Self Care | Payer: HMO | Source: Ambulatory Visit | Attending: Family | Admitting: Family

## 2018-10-01 ENCOUNTER — Other Ambulatory Visit: Payer: Self-pay

## 2018-10-01 ENCOUNTER — Ambulatory Visit (INDEPENDENT_AMBULATORY_CARE_PROVIDER_SITE_OTHER)
Admission: RE | Admit: 2018-10-01 | Discharge: 2018-10-01 | Disposition: A | Discharge status: Home / Self Care | Payer: HMO | Source: Ambulatory Visit | Attending: Family | Admitting: Family

## 2018-10-01 VITALS — BP 140/67 | HR 79 | Temp 97.8°F | Resp 20 | Ht 68.0 in | Wt 168.0 lb

## 2018-10-01 DIAGNOSIS — I739 Peripheral vascular disease, unspecified: Secondary | ICD-10-CM

## 2018-10-01 DIAGNOSIS — I7025 Atherosclerosis of native arteries of other extremities with ulceration: Secondary | ICD-10-CM

## 2018-10-01 DIAGNOSIS — I779 Disorder of arteries and arterioles, unspecified: Secondary | ICD-10-CM

## 2018-10-01 DIAGNOSIS — M86672 Other chronic osteomyelitis, left ankle and foot: Secondary | ICD-10-CM

## 2018-10-01 NOTE — Progress Notes (Signed)
[facility]    [facility]: Follow up peripheral artery occlusive disease  History of Present Illness [name] is a 85 y.o. female who is s/p stent placement in left superficial femoral artery and balloon angioplasty of left tibioperoneal trunk and peroneal artery on 09-03-18 by Dr. Trula Slade for non healing left leg ulcer.  On 04-16-18 Dr. Trula Slade performed a balloon angioplasty of left superficial femoral artery, atherectomy with angioplasty of left peroneal artery, and angioplasty of left anterior tibial artery for left leg ulcer.   On 10-29-15 Dr. Sharol Given performed Irrigation and Debridement Left Foot with excision of skin soft tissue and tendon, and local tissue rearrangement for wound closure 1 x 4 cm for Abscess 2 and 3 Metatarsal Head Left Foot.   Pt states her podiatrist advises surgery on her left foot due to infection in the bone.  Dr. Amalia Hailey note from 09-16-18 assessment indicates ulceration of the left forefoot secondary to diabetes mellitus and osteomyelitis third MTPJ left foot.  She is taking doxycycline for the left foot osteomyelitis.   She denies any known hx of stroke or TIA.   She has had a cardiac stent, sees Dr. Irish Lack, whom pt states referred her to [facility].    Diabetic: Yes, states her last A1C was about 9, uncontrolled  Tobacco use: never used tobacco per pt  Pt meds include: Statin :Yes Betablocker: Yes ASA: Yes Other anticoagulants/antiplatelets: Plavix  Past Medical History:  Diagnosis Date  . Anxiety   . CAD (coronary artery disease)   . Cellulitis 10/2015   LEFT FOOT  . Complication of anesthesia   . Coronary artery disease   . Diabetes mellitus without complication (HCC)    insulin dependent  . GERD (gastroesophageal reflux disease)   . Hypertension   . Hypothyroidism   . Neuromuscular disorder (HCC)    muscle cramps to lower extremities  . Other primary cardiomyopathies   . PONV (postoperative nausea and vomiting)   . Shortness  of breath     Social History Social History   Tobacco Use  . Smoking status: Former Smoker    Last attempt to quit: 11/28/1960    Years since quitting: 57.8  . Smokeless tobacco: Never Used  Substance Use Topics  . Alcohol use: Yes    Alcohol/week: 0.0 standard drinks    Comment: rare  . Drug use: No    Family History Family History  Problem Relation Age of Onset  . Heart disease Father   . Heart attack Father   . Diabetes Sister   . Heart disease Son        before age 54  . Heart attack 40        85yrold  . Sudden death Grandchild   . Hypertension Neg Hx     Past Surgical History:  Procedure Laterality Date  . ABDOMINAL AORTOGRAM N/A 09/03/2018   Procedure: ABDOMINAL AORTOGRAM;  Surgeon: BSerafina Mitchell MD;  [facility];  Service: Cardiovascular;  Laterality: N/A;  . ABDOMINAL AORTOGRAM W/LOWER EXTREMITY N/A 04/16/2018   Procedure: ABDOMINAL AORTOGRAM W/LOWER EXTREMITY;  Surgeon: BSerafina Mitchell MD;  [facility];  Service: Cardiovascular;  Laterality: N/A;  unilateral  . ABDOMINAL HYSTERECTOMY    . CHOLECYSTECTOMY    . CORONARY ANGIOPLASTY WITH STENT PLACEMENT  08/09/2011   DES  to mid circumflex  . I&D EXTREMITY Left 10/29/2015   Procedure: Irrigation and Debridement Left Foot;  Surgeon: MNewt Minion MD;  Location: [facility];  Service: Orthopedics;  Laterality: Left;  . LEFT HEART CATHETERIZATION WITH CORONARY ANGIOGRAM N/A 08/08/2012   Procedure: LEFT HEART CATHETERIZATION WITH CORONARY ANGIOGRAM;  Surgeon: [name], MD;  [facility];  Service: Cardiovascular;  Laterality: N/A;  . PERIPHERAL VASCULAR ATHERECTOMY  04/16/2018   Procedure: PERIPHERAL VASCULAR ATHERECTOMY;  Surgeon: Serafina Mitchell, MD;  Location: [facility];  Service: Cardiovascular;;  lt. Peroneal  . PERIPHERAL VASCULAR BALLOON ANGIOPLASTY  04/16/2018   Procedure: PERIPHERAL VASCULAR BALLOON ANGIOPLASTY;  Surgeon: Serafina Mitchell,  MD;  Location: [facility];  Service: Cardiovascular;;  lt. sfa and AT  . PERIPHERAL VASCULAR BALLOON ANGIOPLASTY Left 09/03/2018   Procedure: PERIPHERAL VASCULAR BALLOON ANGIOPLASTY;  Surgeon: Serafina Mitchell, MD;  Location: [facility];  Service: Cardiovascular;  Laterality: Left;  TP TRUNK  . PERIPHERAL VASCULAR CATHETERIZATION N/A 12/30/2014   Procedure: Lower Extremity Angiography;  Surgeon: Wellington Hampshire, MD;  Location: [facility];  Service: Cardiovascular;  Laterality: N/A;  . PERIPHERAL VASCULAR INTERVENTION Left 09/03/2018   Procedure: PERIPHERAL VASCULAR INTERVENTION;  Surgeon: Serafina Mitchell, MD;  Location: [facility];  Service: Cardiovascular;  Laterality: Left;  SFA STENT   . THYROID SURGERY     radioactive iodine   . TONSILLECTOMY      Allergies  Allergen Reactions  . Sulfa Antibiotics Nausea And Vomiting    Severe vomiting.     Current Outpatient Medications  Medication Sig Dispense Refill  . amLODipine (NORVASC) 10 MG tablet TAKE 1 TABLET BY MOUTH ONCE DAILY 90 tablet 0  . aspirin EC 325 MG tablet Take 325 mg by mouth at bedtime.     Marland Kitchen atorvastatin (LIPITOR) 20 MG tablet Take 1 tablet (20 mg total) by mouth daily. (Patient taking differently: Take 20 mg by mouth every evening. ) 90 tablet 0  . Biotin 5000 MCG CAPS Take 5,000 mcg by mouth daily.    . carvedilol (COREG) 25 MG tablet Take 1 tablet (25 mg total) by mouth 2 (two) times daily with a meal. *Please call and schedule an appointment with Dr Fletcher Anon* 60 tablet 0  . cholecalciferol (VITAMIN D) 1000 units tablet Take 2,000 Units by mouth daily.     . ciprofloxacin (CIPRO) 500 MG tablet Take 1 tablet (500 mg total) by mouth 2 (two) times daily. 20 tablet 0  . clopidogrel (PLAVIX) 75 MG tablet Take 1 tablet (75 mg total) by mouth daily. (Patient taking differently: Take 75 mg by mouth every evening. ) 30 tablet 11  . Coenzyme Q10 (COQ-10) 200 MG CAPS Take 200 mg by mouth daily.    Marland Kitchen  doxycycline (VIBRA-TABS) 100 MG tablet Take 1 tablet (100 mg total) by mouth 2 (two) times daily. 28 tablet 1  . LEVEMIR FLEXTOUCH 100 UNIT/ML Pen Inject 70 Units into the skin every evening.     Marland Kitchen lisinopril (PRINIVIL,ZESTRIL) 20 MG tablet Take 20 mg by mouth every evening.     . nitroGLYCERIN (NITROSTAT) 0.4 MG SL tablet PLACE 1 TABLET UNDER THE TONGUE EVERY 5 MINUTES AS NEEDED FOR CHEST PAIN (Patient taking differently: Place 0.4 mg under the tongue every 5 (five) minutes as needed for chest pain. ) 25 tablet 1  . NOVOLOG FLEXPEN 100 UNIT/ML FlexPen Inject 16 Units into the skin 3 (three) times daily after meals.     . pantoprazole (PROTONIX) 40 MG tablet TAKE 1 TABLET BY MOUTH ONCE DAILY AT  6AM (Patient  taking differently: Take 40 mg by mouth every evening. ) 90 tablet 2  . Vitamin D, Ergocalciferol, (DRISDOL) 50000 units CAPS capsule Take 50,000 Units by mouth every Friday.   11  . Wound Dressings (MEDIHONEY WOUND/BURN DRESSING) GEL Apply 1 application topically daily.     No current facility-administered medications for this visit.     ROS: See HPI for pertinent positives and negatives.   Physical Examination  Vitals:   10/01/18 1247  BP: 140/67  Pulse: 79  Resp: 20  Temp: 97.8 F (36.6 C)  SpO2: 96%  Weight: 168 lb (76.2 kg)  Height: '5\' 8"'$  (1.727 m)   Body mass index is 25.54 kg/m.  General: A&O x 3, WDWN, female. Gait: limp, using cane HENT: No gross abnormalities.  Eyes: PERRLA. Pulmonary: Respirations are non labored, CTAB, good air movement in all fields Cardiac: regular rhythm, no detected murmur.         Carotid Bruits Right Left   Negative Negative   Radial pulses are 2+ palpable bilaterally   Adominal aortic pulse is not palpable                         VASCULAR EXAM: Extremities without ischemic changes, without Gangrene; without open wounds. Left foot ulcer on plantar surface is shallow, depth appears to dermal layer, no erythema, no drainage, no  swelling at the plantar surface.  At the dorsal aspect of her left foot, at the base of toes 2-4, there is mild to moderate swelling, mild dark erythema, and tenderness to moderate compressive touch.                                                                                                           LE Pulses Right Left       FEMORAL  2+ palpable  2+ palpable        POPLITEAL  not palpable   not palpable       POSTERIOR TIBIAL  not palpable   not palpable        DORSALIS PEDIS      ANTERIOR TIBIAL not palpable  2+ palpable    Abdomen: soft, NT, no palpable masses. Skin: no rashes, see Extremities. Musculoskeletal: no muscle wasting or atrophy.  Neurologic: A&O X 3; appropriate affect, Sensation is normal; MOTOR FUNCTION:  moving all extremities equally, motor strength 5/5 throughout. Speech is fluent/normal. CN 2-12 intact. Psychiatric: Thought content is normal, mood appropriate for clinical situation.     ASSESSMENT: [name] is a 85 y.o. female who who is s/p stent placement in left superficial femoral artery and balloon angioplasty of left tibioperoneal trunk and peroneal artery on 09-03-18 by Dr. Trula Slade for non healing left leg ulcer.  On 04-16-18 Dr. Trula Slade performed a balloon angioplasty of left superficial femoral artery, atherectomy with angioplasty of left peroneal artery, and angioplasty of left anterior tibial artery for left leg ulcer.   On 10-29-15 Dr. Sharol Given performed Irrigation and Debridement Left Foot with excision of skin soft tissue  and tendon, and local tissue rearrangement for wound closure 1 x 4 cm for Abscess 2 and 3 Metatarsal Head Left Foot.   Pt states her podiatrist advises surgery on her left foot due to infection in the bone.  Dr. Amalia Hailey note from 09-16-18 assessment indicates ulceration of the left forefoot secondary to diabetes mellitus and osteomyelitis third MTPJ left foot.  She is taking doxycycline for the left foot osteomyelitis.   Her  walking is limited by the mild to moderate pain of the left foot osteomyelitis.   She has some pain and dark red discoloration at the dorsal aspect of her left forefoot.  She states the shallow ulcer on the ball of he left foot is healing, she is treating the ulcer with medi-honey.  Left LE arterial stent duplex today shows no stenosis, biphasic waveforms. Left ABI is normal with biphasic and brisk monophasic waveforms. Right ABI indicates moderate disease with monophasic waveforms.      DATA  Left LE Arterial Duplex (10-01-18): Left Stent(s): +---------------+---++----------+-----+ Prox to Stent  101biphasic        +---------------+---++----------+-----+ Proximal Stent 96 biphasic        +---------------+---++----------+-----+ Mid Stent      83 biphasic        +---------------+---++----------+-----+ Distal Stent   120biphasic        +---------------+---++----------+-----+ Distal to Stent8moophasicbrisk +---------------+---++----------+-----+  Summary: Left: Patent left SFA stent with no evidence for restenosis.    ABI (Date: 10/01/2018): ABI Findings: +---------+------------------+-----+----------+--------+ Right    Rt Pressure (mmHg)IndexWaveform  Comment  +---------+------------------+-----+----------+--------+ Brachial 142                                       +---------+------------------+-----+----------+--------+ PTA      95                0.67 monophasic         +---------+------------------+-----+----------+--------+ DP       96                0.68 monophasic         +---------+------------------+-----+----------+--------+ Great Toe52                0.37 Abnormal           +---------+------------------+-----+----------+--------+  +--------+------------------+-----+----------+---------+ Left    Lt Pressure (mmHg)IndexWaveform  Comment    +--------+------------------+-----+----------+---------+ [id number]                                       +--------+------------------+-----+----------+---------+ PTA     138               0.97 biphasic            +--------+------------------+-----+----------+---------+ DP      151               1.06 monophasicbrisk     +--------+------------------+-----+----------+---------+                                          amputated +--------+------------------+-----+----------+---------+  +-------+-----------+-----------+------------+------------+ ABI/TBIToday's ABIToday's TBIPrevious ABIPrevious TBI +-------+-----------+-----------+------------+------------+ Right  0.68       0.37       0.64        0.50         +-------+-----------+-----------+------------+------------+  Left   1.06                  0.74        0.53         +-------+-----------+-----------+------------+------------+ Summary: Right: Resting right ankle-brachial index indicates moderate right lower extremity arterial disease. The right toe-brachial index is abnormal. Left: Resting left ankle-brachial index is within normal range post procedure . No evidence of significant left lower extremity arterial disease.    PLAN:  Based on the patient's vascular studies and examination, pt will return to clinic in 3 months with ABI's and left LE arterial duplex.   Continue medi-honey to healing ulcer left foot.   I advised her to notify us if she develops worsening concerns re the circulation in her feet or legs.   Daily seated leg exercises discussed and demonstrated.   I discussed in depth with the patient the nature of atherosclerosis, and emphasized the importance of maximal medical management including strict control of blood pressure, blood glucose, and lipid levels, obtaining regular exercise, and continued cessation of smoking.  The patient is aware that without maximal medical  management the underlying atherosclerotic disease process will progress, limiting the benefit of any interventions.   Clemon Chambers, RN, MSN, FNP-C [facility] [employer] Phone: [phone]  Clinic MD: Early  10/01/18 1:13 PM

## 2018-10-02 ENCOUNTER — Other Ambulatory Visit: Payer: Self-pay

## 2018-10-02 NOTE — Patient Outreach (Signed)
  [place] [facility]) Care Management Chronic Special Needs Program  10/02/2018  Name: [name] DOB: Jul 29, 1931  MRN: [id number]  [name] is enrolled in a Chronic Special Needs Plan. RNCM called to review Health Risk Assessment and to complete individualized Care plan. No answer. HIPPA compliant message left.   Plan: Chronic care management coordinator will attempt outreach in 2-3 business days.Thea Silversmith, RN, MSN, [place] [phone]

## 2018-10-03 ENCOUNTER — Other Ambulatory Visit: Payer: Self-pay

## 2018-10-03 NOTE — Patient Outreach (Signed)
  [facility]) Care Management Chronic Special Needs Program  10/03/2018  Name: TEKILA CAILLOUET DOB: Dec 21, 1931  MRN: [id number]  Ms. Jaliyah Fotheringham is enrolled in a Chronic Special Needs Plan. RNCM returned call to client to review Health Risk Assessment and to complete individualized Care plan. No answer. HIPPA compliant message left. 2nd attempt.  Plan: Chronic care management coordinator will attempt outreach in 2-3 business days.Thea Silversmith, RN, MSN, [place] [phone]

## 2018-10-04 ENCOUNTER — Other Ambulatory Visit: Payer: Self-pay

## 2018-10-04 NOTE — Patient Outreach (Signed)
  [facility]) Care Management Chronic Special Needs Program  10/04/2018  Name: SHADARA KLUGMAN DOB: 1931/10/01  MRN: [id number]  Ms. Lucretia Chatten is enrolled in a chronic special needs plan for Diabetes. Chronic Care Management Coordinator telephoned client to review health risk assessment and to develop individualized care plan.  Introduced the chronic care management program, importance of client participation, and taking their care plan to all provider appointments and inpatient facilities.  Reviewed the transition of care process and possible referral to community care management.  Subjective: client reports history of diabetes, hypertension. Client also sates she has a slow healing wound on the bottom of her left foot. She reports the [place] is managing it and that it is healing. Client acknowledges stress stating that her husband has dementia and she is the primary caregiver. She states she is still able to drive and take care of her ADL/IADL's, but states she finds herself getting tired. She is receptive to social work consult for resources. Client also is taking greater than 8 medications and states last year she went in to the "donut hole" and is receptive to pharmacy referral.  Goals Addressed            This Visit's Progress   . Client understands the importance of follow-up with providers by attending scheduled visits      . Client will use Assistive Devices as needed and verbalize understanding of device use      . Client will verbalize knowledge of self management of Hypertension as evidences by BP reading of 140/90 or less; or as defined by provider      . HEMOGLOBIN A1C < 7.0      . Maintain timely refills of diabetic medication as prescribed within the year .      Marland Kitchen Obtain annual  Lipid Profile, LDL-C      . Obtain Annual Eye (retinal)  Exam       . Obtain Annual Foot Exam      . Obtain annual screen for micro albuminuria (urine) , nephropathy (kidney  problems)      . Obtain Hemoglobin A1C at least 2 times per year      . Patient Stated: continued wound healing bottom of left foot.      . Visit Primary Care Provider or Endocrinologist at least 2 times per year          Plan:  Send successful outreach letter with a copy of their individualized care plan, Send individual care plan to provider and Send educational material Chronic care management coordination will outreach in 6 months. Will refer client to:  Social Work and [place], RN, MSN, [facility] [phone]

## 2018-10-07 ENCOUNTER — Telehealth: Payer: Self-pay | Admitting: Pharmacist

## 2018-10-07 DIAGNOSIS — E1159 Type 2 diabetes mellitus with other circulatory complications: Secondary | ICD-10-CM | POA: Diagnosis not present

## 2018-10-07 DIAGNOSIS — Z794 Long term (current) use of insulin: Secondary | ICD-10-CM | POA: Diagnosis not present

## 2018-10-07 DIAGNOSIS — R42 Dizziness and giddiness: Secondary | ICD-10-CM | POA: Diagnosis not present

## 2018-10-07 DIAGNOSIS — E7849 Other hyperlipidemia: Secondary | ICD-10-CM | POA: Diagnosis not present

## 2018-10-07 DIAGNOSIS — E11621 Type 2 diabetes mellitus with foot ulcer: Secondary | ICD-10-CM | POA: Diagnosis not present

## 2018-10-07 DIAGNOSIS — E1129 Type 2 diabetes mellitus with other diabetic kidney complication: Secondary | ICD-10-CM | POA: Diagnosis not present

## 2018-10-07 DIAGNOSIS — Z9861 Coronary angioplasty status: Secondary | ICD-10-CM | POA: Diagnosis not present

## 2018-10-07 DIAGNOSIS — I739 Peripheral vascular disease, unspecified: Secondary | ICD-10-CM | POA: Diagnosis not present

## 2018-10-07 DIAGNOSIS — M8660 Other chronic osteomyelitis, unspecified site: Secondary | ICD-10-CM | POA: Diagnosis not present

## 2018-10-07 DIAGNOSIS — N182 Chronic kidney disease, stage 2 (mild): Secondary | ICD-10-CM | POA: Diagnosis not present

## 2018-10-07 DIAGNOSIS — E1140 Type 2 diabetes mellitus with diabetic neuropathy, unspecified: Secondary | ICD-10-CM | POA: Diagnosis not present

## 2018-10-07 DIAGNOSIS — I10 Essential (primary) hypertension: Secondary | ICD-10-CM | POA: Diagnosis not present

## 2018-10-07 NOTE — Telephone Encounter (Signed)
-----   Message from [place] sent at 10/04/2018  2:45 PM EDT ----- Regarding: RE: Order for Valere Dross Please call Ms. Truman Hayward, see message below.  Thank you    ----- Message ----- From: Luretha Rued, RN Sent: 10/04/2018   1:11 PM EDT To: Thn Cm Communication Orders Subject: Order for SYRITA, DOVEL                          Patient Name: [name], [name]([id number]) Sex: Female DOB: [birth year]    PCP: [place]   Types of orders made on 10/04/2018: Nursing  Order Date:10/04/2018 Ordering Urban Gibson [[id number]] Encounter [place], Deno Etienne, RN [[id number]] Authorizing Provider: Marton Redwood, MD [place] Department:[facility]-LINK TO [place]  Order Specific Information Order: Comm to Pharmacy [Custom: [id number]]  Order #: [id number] Qty: 1   Priority: Routine  Class: Clinic Performed   Comment:Please assign pharmacist                        C-SNP  Polypharmacy-medication review. Client reports she fell in             the "donut hole" last year. Thank you.                         Thea Silversmith, RN, MSN, [place]            [phone]     Reason for Consult -> Polypharmacy       Priority: Routine  Class: Clinic Performed   Comment:Please assign pharmacist                        C-SNP  Polypharmacy-medication review. Client reports she fell in             the "donut hole" last year. Thank you.                         Thea Silversmith, RN, MSN, [place]            [phone]     Reason for Consult -> Polypharmacy

## 2018-10-07 NOTE — Patient Outreach (Signed)
[facility]) Care Management  [place]   10/07/2018  VENDETTA PITTINGER 06/29/1932 [id number]  Reason for referral: Medication Assistance, Medication Review  Referral source: [facility] RN Current insurance: Health [employer] Advantage C-SNP  PMHx includes but not limited to:   CAD, type 2 diabetes, hypertension, mitral valve disorder, hyperlipidemia and PAD.  Outreach:  Successful telephone call with patient.  HIPAA identifiers verified.   Subjective:  Patient is an 85 year old female with multiple medical conditions including but not limited to:  PAD, hyperlipidemia, mitral valve disorder, hypertension, type 2 diabetes, and CHF. Patient reported she does not have any medication issues but is concerned about what will happen when she goes into the coverage gap.   Objective: Lab Results  Component Value Date   CREATININE 0.50 09/03/2018   CREATININE 0.70 04/16/2018   CREATININE 0.53 10/28/2015    No results found for: HGBA1C  Lipid Panel     Component Value Date/Time   CHOL 141 12/21/2014 0733   TRIG (H) 12/21/2014 0733    442.0 Triglyceride is over 400; calculations on Lipids are invalid.   HDL 29.10 (L) 12/21/2014 0733   CHOLHDL 5 12/21/2014 0733   VLDL 56.6 (H) 06/22/2014 0737   LDLCALC 24 10/20/2013 0739   LDLDIRECT 46.0 12/21/2014 0733    BP Readings from Last 3 Encounters:  10/01/18 140/67  09/03/18 (!) 131/50  08/27/18 (!) 161/77    Allergies  Allergen Reactions  . Sulfa Antibiotics Nausea And Vomiting    Severe vomiting.     Medications Reviewed Today    Reviewed by Elayne Guerin, [facility] (Pharmacist) on 10/07/18 at 1521  Med List Status: <None>  Medication Order Taking? Sig Documenting Provider Last Dose Status Informant  amLODipine (NORVASC) 10 MG tablet [id number] Yes TAKE 1 TABLET BY MOUTH ONCE DAILY [name], MD Taking Active   aspirin EC 325 MG tablet [id number] Yes Take 325 mg by mouth at bedtime.  [provider]  Taking Active Self  atorvastatin (LIPITOR) 20 MG tablet [id number] Yes Take 1 tablet (20 mg total) by mouth daily.  Patient taking differently:  Take 20 mg by mouth every evening.    [name], MD Taking Active Self  Biotin 5000 MCG CAPS [id number] Yes Take 5,000 mcg by mouth daily. [provider] Taking Active Self  carvedilol (COREG) 25 MG tablet [id number] Yes Take 1 tablet (25 mg total) by mouth 2 (two) times daily with a meal. *Please call and schedule an appointment with Dr Fletcher AnonFletcher Anon, Mertie Clause, MD Taking Active Self  cholecalciferol (VITAMIN D) 1000 units tablet [id number] Yes Take 2,000 Units by mouth daily.  [provider] Taking Active Self       Patient not taking:       Discontinued 10/07/18 1518 (Completed Course)   clopidogrel (PLAVIX) 75 MG tablet [id number] Yes Take 1 tablet (75 mg total) by mouth daily.  Patient taking differently:  Take 75 mg by mouth every evening.    Serafina Mitchell, MD Taking Active Self  Coenzyme Q10 (COQ-10) 200 MG CAPS [id number] Yes Take 200 mg by mouth daily. [provider] Taking Active Self  doxycycline (VIBRA-TABS) 100 MG tablet [id number] Yes Take 1 tablet (100 mg total) by mouth 2 (two) times daily. Edrick Kins, DPM Taking Active   LEVEMIR FLEXTOUCH 100 UNIT/ML Pen [id number] Yes Inject 70 Units into the skin every evening.  [provider] Taking Active Self  Med Note (HARRIS, YOLINDA T   Thu Aug 29, 2018  1:02 PM)    lisinopril (PRINIVIL,ZESTRIL) 20 MG tablet [id number] Yes Take 20 mg by mouth every evening.  [provider] Taking Active Self           Med Note (JONES, DOROTHEA G   Thu Dec 30, 2015  8:48 AM)    nitroGLYCERIN (NITROSTAT) 0.4 MG SL tablet [id number] Yes PLACE 1 TABLET UNDER THE TONGUE EVERY 5 MINUTES AS NEEDED FOR CHEST PAIN  Patient taking differently:  Place 0.4 mg under the tongue every 5 (five) minutes as needed for chest pain.    Varanasi, Jayadeep S, MD  Taking Active Self  NOVOLOG FLEXPEN 100 UNIT/ML FlexPen [id number] Yes Inject 10 units with breakfast and 16 units with lunch and dinner [provider] Taking Active Self           Med Note (HARRIS, YOLINDA T   Thu Aug 29, 2018  1:02 PM)    pantoprazole (PROTONIX) 40 MG tablet [id number] Yes TAKE 1 TABLET BY MOUTH ONCE DAILY AT  6AM  Patient taking differently:  Take 40 mg by mouth every evening.    Varanasi, Jayadeep S, MD Taking Active Self  Vitamin D, Ergocalciferol, (DRISDOL) 50000 units CAPS capsule [id number] Yes Take 50,000 Units by mouth every Friday.  [provider] Taking Active Self  Wound Dressings (MEDIHONEY WOUND/BURN DRESSING) GEL [id number] Yes Apply 1 application topically daily. [provider] Taking Active Self          Assessment:  Drugs sorted by system:  Cardiovascular: Amlodipine, EC Aspirin, Atorvastatin, Carvedilol, Clopidogrel, Lisinopril, Nitroglycerin,   Gastrointestinal: Pantoprazole,    Endocrine: Levemir, Novolog,   Topical: Wound Dressing  Infectious Diseases: Doxycycline  Vitaminls/Supplements: Biotin, Cholecalciferol,  CoEnzyme 10, Vitamin D,    Medication Review Findings:  . Patient did not have a recent HgA1c on file. Last one found in the chart was 10.6% 05/2017.  Patient reported her HgA1c was between 7.1-9.6%   Medication Adherence Findings: Adherence Review  [] Excellent (no doses missed/week)     [x] Good (no more than 1 dose missed/week)     [] Partial (2-3 doses missed/week) [] Poor (>3 doses missed/week)  Patient with good understanding of regimen and good of indications.    Potential of compliance: good  Medication Assistance Findings:  Medication assistance needs identified.   Extra Help:  Not eligible for Extra Help Low Income Subsidy based on reported income and assets  Patient Assistance Programs:  Basaglar & Humalog made by [employer] [name] (substituted for Levemir and Novolog) o Income  requirement met: Yes o Out-of-pocket prescription expenditure met:   No - Patient has met application requirements to apply for this patient assistance program.    Additional medication assistance options reviewed with patient as warranted:  No other options identified  Plan: Will contact PCP  regarding therapeutic switch from Levemir and Novolog to Basaglar and Humalog. , Will follow-up in 1 week    Katina J. Boyd, PharmD, BCACP [facility] Clinical Pharmacist [phone]      

## 2018-10-14 ENCOUNTER — Encounter: Payer: Self-pay | Admitting: *Deleted

## 2018-10-14 ENCOUNTER — Ambulatory Visit: Payer: Self-pay | Admitting: Pharmacist

## 2018-10-14 ENCOUNTER — Other Ambulatory Visit: Payer: Self-pay | Admitting: *Deleted

## 2018-10-14 NOTE — Patient Outreach (Signed)
[facility]) Care Management  10/14/2018  [name] [birth year] [id number]   CSW was able to make initial contact with patient today to perform phone assessment, as well as assess and assist with social work needs and services.  CSW introduced self, explained role and types of services provided through [place] [facility] ([place] [facility]).  CSW further explained to patient that CSW works with patient's [place] Management Coordinator, Thea Silversmith, also with [place] Management. CSW then explained the reason for the call, indicating that [name] thought that patient would benefit from social work services and resources to assist with arranging in-home care services for her husband who is suffering from dementia.  CSW obtained two HIPAA compliant identifiers from patient, which included patient's name and date of birth.  [name] admitted that she could really use some help in the home caring for her husband.  [name] denied [name], or herself, being a English as a second language teacher, nor did [name] think that she or her husband would qualify for Adult Medicaid through the [place].  CSW explained to [name] that in-home care services and/or private agency sitter services would be an out-of-pocket expense, as Health[employer] Advantage Medicare does not cover the cost of aide services.  [name] voiced understanding, agreeing to cover the expense with their savings account.  CSW agreed to mail [name] the following list of information: [place] CSW then agreed to follow-up with [name] next week (on Monday, October 21, 2018 around 9:00AM), to ensure that she received the list of resources, as well as answer any questions that she may have at that time. [name] voiced understanding and was agreeable to this plan.  No additional social work needs have been identified at this time.  Nat Christen, BSW, MSW, LCSW  Licensed Education officer, environmental Health System  Mailing [place] [address] [place], [place] [address] Physical Address-[address], [place], [place] [address] Toll Free Main # [phone] Fax # [phone] Cell # [phone]  Office # [phone] [email or web address]

## 2018-10-15 ENCOUNTER — Other Ambulatory Visit: Payer: Self-pay | Admitting: Pharmacist

## 2018-10-15 ENCOUNTER — Ambulatory Visit: Payer: Self-pay | Admitting: Pharmacist

## 2018-10-15 NOTE — Patient Outreach (Signed)
[facility]) Care Management  10/15/2018  SMRITHI NILSSON 07-27-1931 [id number]   A message was left for Dr. Raul Del nurse about switching the patient to [place]o and Humalog versus Levemir and Novolog due to the lower patient assistance requirements of the [employer].  Plan: Follow up in 7-10 business days if I do not hear back from patient's provider first.  Elayne Guerin, PharmD, [place] Clinical Pharmacist [phone]

## 2018-10-16 ENCOUNTER — Other Ambulatory Visit: Payer: HMO

## 2018-10-16 ENCOUNTER — Telehealth: Payer: Self-pay | Admitting: Podiatry

## 2018-10-16 NOTE — Telephone Encounter (Signed)
Pt called asking for a disc of her xray's for this week.   I did tell pt there was a 5.00 fee for a disc and we would call when they are ready.

## 2018-10-21 ENCOUNTER — Other Ambulatory Visit: Payer: Self-pay | Admitting: *Deleted

## 2018-10-21 ENCOUNTER — Encounter: Payer: Self-pay | Admitting: *Deleted

## 2018-10-21 NOTE — Patient Outreach (Signed)
[facility]) Care Management  10/21/2018  [name] 1931-12-31 [id number]   CSW was able to make contact with patient today to follow-up regarding social work services and resources, as well as ensure that patient received the packet of resource information mailed to her home by CSW.  Patient confirmed receiving the list of [place] and [place], denying having questions about either or needing assistance with the referral process.  Patient indicated that she plans to begin calling the list of agencies within the next week to line up services.  CSW was able to confirm that patient has the correct contact information for CSW.  CSW will perform a case closure on patient, as all goals of treatment have been met from social work standpoint and no additional social work needs have been identified at this time.  CSW will notify patient's C-SNP Chronic Care Management Coordinator, also with [place] Management, Thea Silversmith of CSW's plans to close patient's case.  CSW will fax an update to patient's Primary Care Physician, Dr. Marton Redwood to ensure that they are aware of CSW's involvement with patient's plan of care.    Nat Christen, BSW, MSW, LCSW  Licensed Education officer, environmental Health System  Mailing [place] [address], [place], [place] [address] Physical Address-[address], [place], [place] [address] Toll Free Main # [phone] Fax # [phone] Cell # [phone]  Office # [phone] [email or web address]

## 2018-10-29 ENCOUNTER — Other Ambulatory Visit: Payer: Self-pay | Admitting: Pharmacy Technician

## 2018-10-29 ENCOUNTER — Other Ambulatory Visit: Payer: Self-pay | Admitting: Pharmacist

## 2018-10-29 ENCOUNTER — Ambulatory Visit: Payer: Self-pay | Admitting: Pharmacist

## 2018-10-29 NOTE — Patient Outreach (Addendum)
[facility]) Care Management  10/29/2018  [name] 1931-10-17 [id number]   Dr. Raul Del office was called to inquire about the message left last week requesting to change the patient from Levemir and Novolog to Basaglar and Humalog respectively.  A message was left on Dr. Raul Del CMA's voicemail.  Patient was called. She was unable to talk for long because she was in [place] identifiers were obtained. She was informed we were still trying to get in touch with her provider about therapeutic substitutions.  Plan: Send a barrier's letter to Dr. Brigitte Pulse Follow up with the patient in 7-10 business days.   Elayne Guerin, PharmD, [place] Clinical Pharmacist [phone]  ADDENDUM  Patient called me back. HIPAA identifiers were obtained. Patient said Dr. Raul Del nurse called her and told her they were on board with switching her to [place].  Just after getting off the phone with the patient. Dr. Raul Del nurse, [place] [facility] left a message stating they were "on board" with switching the patient to Basaglar and Humalog.  Plan: Route note to Sharee Pimple [place] to send patient a patient assistance application for Basaglar and Humalog.  Follow up in 2-3 weeks.  Elayne Guerin, PharmD, [place] Clinical Pharmacist [phone]

## 2018-10-29 NOTE — Patient Outreach (Signed)
[facility]) Care Management  10/29/2018  [name] [birth year] [id number]                           Medication Assistance Referral  Referral From: [place]  Medication/Company: [name] Patient application portion:  Mailed Provider application portion: Faxed  to Dr Marton Redwood  Medication/Company: [name] Patient application portion:  Mailed Provider application portion: Faxed  to Dr Marton Redwood   Follow up:  Will follow up with patient in 5-10 business days to confirm application(s) have been received.  [name] P. Moselle Rister, [place] Management [phone]

## 2018-11-04 ENCOUNTER — Other Ambulatory Visit: Payer: Self-pay | Admitting: Pharmacy Technician

## 2018-11-04 ENCOUNTER — Other Ambulatory Visit: Payer: Self-pay | Admitting: Pharmacist

## 2018-11-04 NOTE — Patient Outreach (Signed)
[facility]) Care Management  11/04/2018  [name] Nov 17, 1931 [id number]  Incoming call received from patient in regards to [place] application for [employer] ad Humalog.  Spoke to patient, HIPAA identifiers verified. Patient informed she had received the applications and was in the process of completing them. She informed that her son would have to help with the financial piece and therefore the applications would be sent back either at the end of this week of the beginning of the following week.  Patient informed she had additional questions about the switching of the medications. Answered her questions to the best of my ability but advised patient to followup with [place] with the questions that related to pharmacology. Supplied patient with [name] number.  Will followup with patient in 10-14 business days if application has not been received back.  Chanc Kervin P. Rayleigh Gillyard, [place] Management [phone]

## 2018-11-04 NOTE — Patient Outreach (Signed)
[facility]) Care Management  11/04/2018  GEOVANNA SIMKO 1932/06/12 [id number]   Patient called with questions about medication assistance. HIPAA identifiers were obtained. She wondered if she would be charged for the medication she would be getting from [employer]. She was assured that she would not as she is being applied for a patient assistance program.    Plan: Susy Frizzle will continue to follow.   Elayne Guerin, PharmD, [place] Clinical Pharmacist [phone]

## 2018-11-08 ENCOUNTER — Ambulatory Visit: Payer: Self-pay | Admitting: Pharmacist

## 2018-11-12 ENCOUNTER — Other Ambulatory Visit: Payer: Self-pay | Admitting: Pharmacy Technician

## 2018-11-12 ENCOUNTER — Other Ambulatory Visit: Payer: Self-pay | Admitting: Interventional Cardiology

## 2018-11-12 NOTE — Patient Outreach (Signed)
[place] [facility]) Care Management  11/12/2018  [name] 19-Jun-1932 [id number]    Received all necessary documents and signatures from both patient and provider for [name] application for Humalog and Basaglar.  Submitted completed application via fax to [employer].  Will followup with [name] in 3-5 business days to inquire on status of application.  Oluwaseun Bruyere P. Aritha Huckeba, [place] Management [phone]

## 2018-11-18 ENCOUNTER — Other Ambulatory Visit: Payer: Self-pay | Admitting: Pharmacy Technician

## 2018-11-18 NOTE — Patient Outreach (Signed)
[facility]) Care Management  11/18/2018  [name] 1931-11-14 [id number]   Successful outreach call placed to patient in regards to [place] application for Humalog and [place].  Spoke to patient, HIPAA identifiers verified.  Informed patient to be expecting a phone call from [place] on behalf of [place], a [place], [place] phone number, to arrange a delivery date for her medication. Once that date has bee established then it is overnighted to that patient via UPS. Patient verbalized understanding.  Will followup with patient in 3-5 business days to inquire if she has received the medication.  Chara Marquard P. Parker Sawatzky, [place] Management [phone]

## 2018-11-18 NOTE — Patient Outreach (Signed)
[facility]) Care Management  11/18/2018  ILHAM ROUGHTON [birth year] [id number]   Care coordination call placed to [facility] in regards to application for Humalog and [place].  Spoke to [facility] who informed patient had been APPROVED for both medications 11/13/2018-07/17/2019. Jasmine informed that the shipment was in progress. She informed that the pharmacy received the order on 11/13/2018 and that it was still in progress which means that they either need to speak to provider or patient to arrange delivery. She also informed that patient should be receiving 4 Humalog and 6 Basaglar.  Will outreach patient to inform her to be expecting a call about delivery.  Will followup with [name] in 3-5 business days to inquire on delivery process.  Gabrielle [place]akeland P. Raiza Kiesel, [place] Management [phone]

## 2018-11-22 ENCOUNTER — Other Ambulatory Visit: Payer: Self-pay | Admitting: Pharmacy Technician

## 2018-11-22 NOTE — Patient Outreach (Signed)
[facility]) Care Management  11/22/2018  HAZYL MARSEILLE 1932-04-06 [id number]  Care coordination call placed to [facility] in regards to patient's application for Humalog and Basaglar.   Spoke to [place] who informed the delivery shipment is in progress. Verdis Frederickson called over to [facility] and was informed that the medication is ready for shipment and that the patient should be expecting a phone call to set up delivery.  Will followup with either [name] or patient in 3-4 business days to inquire about delivery.  Kariem Wolfson P. Leeta Grimme, [place] Management [phone]

## 2018-11-26 ENCOUNTER — Other Ambulatory Visit: Payer: Self-pay

## 2018-11-26 NOTE — Patient Outreach (Signed)
  [facility]) Care Management Chronic Special Needs Program    11/26/2018  Name: [name], DOB: Dec 31, 1931  MRN: [id number]   Ms. [name] is enrolled in a chronic special needs plan. RNCM returned call to client who reports she was calling to find out when her insulin was going to arrive, but states she has already found out the answer-it will be arriving on Thursday. RNCM encouraged client to contact RNCM as needed.  Plan: follow up as previously scheduled.  Thea Silversmith, RN, MSN, [place] [phone]

## 2018-11-27 ENCOUNTER — Other Ambulatory Visit: Payer: Self-pay | Admitting: Pharmacy Technician

## 2018-11-27 ENCOUNTER — Other Ambulatory Visit (HOSPITAL_COMMUNITY): Payer: Self-pay | Admitting: Orthopedic Surgery

## 2018-11-27 ENCOUNTER — Telehealth: Payer: Self-pay | Admitting: Surgery

## 2018-11-27 DIAGNOSIS — M869 Osteomyelitis, unspecified: Secondary | ICD-10-CM | POA: Diagnosis not present

## 2018-11-27 DIAGNOSIS — M79672 Pain in left foot: Secondary | ICD-10-CM | POA: Diagnosis not present

## 2018-11-27 DIAGNOSIS — L97524 Non-pressure chronic ulcer of other part of left foot with necrosis of bone: Secondary | ICD-10-CM | POA: Diagnosis not present

## 2018-11-27 NOTE — Patient Outreach (Signed)
[facility]) Care Management  11/27/2018  [name] 06/14/1932 [id number]   ADDENDUM  Unsuccessful outeach call placed to patient in regards to [place] application for Humalog and [place].  Unfortunately patient did not answer the phone. HIPAA compliant voicemail left.  Was calling patient to provide her with the phone number for [name] to set up delivery of her medication.  Will followup with patient or [name] in 3-5 business days to inquire about delivery status.  Mea Ozga P. Shemiah Rosch, [place] Management [phone]

## 2018-11-27 NOTE — Telephone Encounter (Addendum)
Patient called wanting to know if our office received the surgical clearance form Dr. Wylene Simmer faxed to our office this morning. The form needs our Dr.s signature with a date range to hold Plavix before patient has surgery to amputate 2 toes. I advised patient that I had not received the form yet.  I requested the form again from Dr. Renelda Loma office.  We will contact the patient once we get both the form and Dr. Stephens Shire signature.   Thurston Hole., LPN

## 2018-11-27 NOTE — Patient Outreach (Signed)
[facility]) Care Management  11/27/2018  ELLASYN SWILLING 04-21-1932 [id number]    Care coordination call placed to [facility] in regards to patient's application for Humalog and Basaglar.  Spoke to [place] who informed that the delivery status has not been updated since the 8th of May. She suggested to have the patient call into [name] at [phone] and they would transfer her to [facility] to set up delivery.  Will outreach patient with this information.  Merlen Gurry P. Kaseem Vastine, [place] Management [phone]

## 2018-11-28 ENCOUNTER — Other Ambulatory Visit: Payer: Self-pay

## 2018-11-28 ENCOUNTER — Encounter (HOSPITAL_BASED_OUTPATIENT_CLINIC_OR_DEPARTMENT_OTHER): Payer: Self-pay

## 2018-11-29 ENCOUNTER — Other Ambulatory Visit (HOSPITAL_COMMUNITY)
Admission: RE | Admit: 2018-11-29 | Discharge: 2018-11-29 | Disposition: A | Discharge status: Home / Self Care | Payer: HMO | Source: Ambulatory Visit | Attending: Orthopedic Surgery | Admitting: Orthopedic Surgery

## 2018-11-29 ENCOUNTER — Encounter (HOSPITAL_BASED_OUTPATIENT_CLINIC_OR_DEPARTMENT_OTHER)
Admission: RE | Admit: 2018-11-29 | Discharge: 2018-11-29 | Disposition: A | Discharge status: Home / Self Care | Payer: HMO | Source: Ambulatory Visit | Attending: Orthopedic Surgery | Admitting: Orthopedic Surgery

## 2018-11-29 ENCOUNTER — Other Ambulatory Visit: Payer: Self-pay | Admitting: Pharmacy Technician

## 2018-11-29 DIAGNOSIS — E1142 Type 2 diabetes mellitus with diabetic polyneuropathy: Secondary | ICD-10-CM | POA: Diagnosis not present

## 2018-11-29 DIAGNOSIS — Z79899 Other long term (current) drug therapy: Secondary | ICD-10-CM | POA: Diagnosis not present

## 2018-11-29 DIAGNOSIS — Z7902 Long term (current) use of antithrombotics/antiplatelets: Secondary | ICD-10-CM | POA: Diagnosis not present

## 2018-11-29 DIAGNOSIS — Z1159 Encounter for screening for other viral diseases: Secondary | ICD-10-CM | POA: Diagnosis not present

## 2018-11-29 DIAGNOSIS — K219 Gastro-esophageal reflux disease without esophagitis: Secondary | ICD-10-CM | POA: Diagnosis not present

## 2018-11-29 DIAGNOSIS — L97529 Non-pressure chronic ulcer of other part of left foot with unspecified severity: Secondary | ICD-10-CM | POA: Diagnosis not present

## 2018-11-29 DIAGNOSIS — Z7982 Long term (current) use of aspirin: Secondary | ICD-10-CM | POA: Diagnosis not present

## 2018-11-29 DIAGNOSIS — E11621 Type 2 diabetes mellitus with foot ulcer: Secondary | ICD-10-CM | POA: Diagnosis not present

## 2018-11-29 DIAGNOSIS — I429 Cardiomyopathy, unspecified: Secondary | ICD-10-CM | POA: Diagnosis not present

## 2018-11-29 DIAGNOSIS — E1152 Type 2 diabetes mellitus with diabetic peripheral angiopathy with gangrene: Secondary | ICD-10-CM | POA: Diagnosis not present

## 2018-11-29 DIAGNOSIS — I2510 Atherosclerotic heart disease of native coronary artery without angina pectoris: Secondary | ICD-10-CM | POA: Diagnosis not present

## 2018-11-29 DIAGNOSIS — Z955 Presence of coronary angioplasty implant and graft: Secondary | ICD-10-CM | POA: Diagnosis not present

## 2018-11-29 DIAGNOSIS — I10 Essential (primary) hypertension: Secondary | ICD-10-CM | POA: Diagnosis not present

## 2018-11-29 DIAGNOSIS — Z882 Allergy status to sulfonamides status: Secondary | ICD-10-CM | POA: Diagnosis not present

## 2018-11-29 DIAGNOSIS — E1169 Type 2 diabetes mellitus with other specified complication: Secondary | ICD-10-CM | POA: Diagnosis not present

## 2018-11-29 DIAGNOSIS — Z87891 Personal history of nicotine dependence: Secondary | ICD-10-CM | POA: Diagnosis not present

## 2018-11-29 DIAGNOSIS — Z01812 Encounter for preprocedural laboratory examination: Secondary | ICD-10-CM | POA: Insufficient documentation

## 2018-11-29 DIAGNOSIS — M869 Osteomyelitis, unspecified: Secondary | ICD-10-CM | POA: Diagnosis not present

## 2018-11-29 DIAGNOSIS — Z794 Long term (current) use of insulin: Secondary | ICD-10-CM | POA: Diagnosis not present

## 2018-11-29 DIAGNOSIS — Z9862 Peripheral vascular angioplasty status: Secondary | ICD-10-CM | POA: Diagnosis not present

## 2018-11-29 LAB — BASIC METABOLIC PANEL
Anion gap: 8 (ref 5–15)
BUN: 18 mg/dL (ref 8–23)
CO2: 25 mmol/L (ref 22–32)
Calcium: 8.9 mg/dL (ref 8.9–10.3)
Chloride: 104 mmol/L (ref 98–111)
Creatinine, Ser: 0.61 mg/dL (ref 0.44–1.00)
GFR calc Af Amer: >60 mL/min (ref >60)
GFR calc non Af Amer: >60 mL/min (ref >60)
Glucose, Bld: 224 mg/dL — ABNORMAL HIGH (ref 70–99)
Potassium: 5 mmol/L (ref 3.5–5.1)
Sodium: 137 mmol/L (ref 135–145)

## 2018-11-29 NOTE — Patient Outreach (Signed)
[name]  11/29/2018  [name] 12-22-1931 [id number]   ADDENDUM  Successful outreach call placed to patient in regards to [place] application for Humalog and [place].  Spoke to patient who informed that she had received 4 boxes of Humalog and 6 boxes of Basaglar. Discussed with patient how to obtain her refills and patient verbalized understanding.  Patient inquired if she would have any side effects from switching the medication. Informed patient that I would outreach the pharmacist to call her. She informed that she was having surgery to amputate 2 toes on Tuesday just as a scheduling fyi. Confirmed patient had my name and number for any issues relating to [facility] assistance.  Will route note to [name] to outreach patient concerning her question  and will remove myself from care team as medication assistance has been completed.  [name] P. Icesis Renn, [place] Management [phone]

## 2018-11-29 NOTE — Progress Notes (Signed)
G2 drink given with instructions to complete by [place], pt verbalized understanding.

## 2018-11-29 NOTE — Patient Outreach (Signed)
[place] [facility]) Care Management  11/29/2018  [name] Apr 04, 1932 [id number]   Care coordination call placed to [facility] in regards to patient's Humalog and Basaglar application.  [name] informed that both medications will delivered on 11/28/2018.  Will outreach patient.  [name] P. Jud Fanguy, [place] Management [phone]

## 2018-11-30 LAB — NOVEL CORONAVIRUS, NAA (HOSP ORDER, SEND-OUT TO REF LAB; TAT 18-24 HRS): SARS-CoV-2, NAA: NOT DETECTED

## 2018-12-02 ENCOUNTER — Other Ambulatory Visit: Payer: Self-pay | Admitting: Pharmacist

## 2018-12-02 NOTE — Patient Outreach (Signed)
[place] [facility]) Care Management  12/02/2018  [name] 08/29/1931 [id number]   Patient was called regarding a question she had about her insulin shipment.  HIPAA identifiers were obtained.  Patient was concerned about the quantity she received. We reviewed the label and calculated the units. Patient said she was shipped 3 boxes of Humalog and 6 boxes of Basaglar.   On a note from [facility], patient reported getting 4 boxes of Humalog. Today she said she was agreeing with what Sharee Pimple reported to her as what she received. From calculating her dose, 3 boxes would be a 3 month supply.  She reported the label from [place] read 15 ml on each box but did not report a total.  Patient was encouraged to call [facility] to receive an accurate count of what was sent to her.   Plan:  Call patient back in 4 weeks to check on her.   Elayne Guerin, PharmD, [place] Clinical Pharmacist [phone]

## 2018-12-03 ENCOUNTER — Encounter (HOSPITAL_BASED_OUTPATIENT_CLINIC_OR_DEPARTMENT_OTHER)
Admission: RE | Disposition: A | Discharge status: Home / Self Care | Payer: Self-pay | Source: Home / Self Care | Attending: Orthopedic Surgery

## 2018-12-03 ENCOUNTER — Ambulatory Visit (HOSPITAL_BASED_OUTPATIENT_CLINIC_OR_DEPARTMENT_OTHER): Payer: HMO | Admitting: Anesthesiology

## 2018-12-03 ENCOUNTER — Ambulatory Visit (HOSPITAL_BASED_OUTPATIENT_CLINIC_OR_DEPARTMENT_OTHER)
Admission: RE | Admit: 2018-12-03 | Discharge: 2018-12-03 | Disposition: A | Discharge status: Home / Self Care | Payer: HMO | Attending: Orthopedic Surgery | Admitting: Orthopedic Surgery

## 2018-12-03 ENCOUNTER — Encounter (HOSPITAL_BASED_OUTPATIENT_CLINIC_OR_DEPARTMENT_OTHER): Payer: Self-pay | Admitting: *Deleted

## 2018-12-03 ENCOUNTER — Other Ambulatory Visit: Payer: Self-pay

## 2018-12-03 DIAGNOSIS — E1142 Type 2 diabetes mellitus with diabetic polyneuropathy: Secondary | ICD-10-CM | POA: Diagnosis not present

## 2018-12-03 DIAGNOSIS — Z1159 Encounter for screening for other viral diseases: Secondary | ICD-10-CM | POA: Diagnosis not present

## 2018-12-03 DIAGNOSIS — M869 Osteomyelitis, unspecified: Secondary | ICD-10-CM | POA: Diagnosis not present

## 2018-12-03 DIAGNOSIS — L97521 Non-pressure chronic ulcer of other part of left foot limited to breakdown of skin: Secondary | ICD-10-CM | POA: Diagnosis not present

## 2018-12-03 DIAGNOSIS — I2510 Atherosclerotic heart disease of native coronary artery without angina pectoris: Secondary | ICD-10-CM | POA: Diagnosis not present

## 2018-12-03 DIAGNOSIS — K219 Gastro-esophageal reflux disease without esophagitis: Secondary | ICD-10-CM | POA: Insufficient documentation

## 2018-12-03 DIAGNOSIS — I5032 Chronic diastolic (congestive) heart failure: Secondary | ICD-10-CM | POA: Diagnosis not present

## 2018-12-03 DIAGNOSIS — Z87891 Personal history of nicotine dependence: Secondary | ICD-10-CM | POA: Insufficient documentation

## 2018-12-03 DIAGNOSIS — Z79899 Other long term (current) drug therapy: Secondary | ICD-10-CM | POA: Insufficient documentation

## 2018-12-03 DIAGNOSIS — E1152 Type 2 diabetes mellitus with diabetic peripheral angiopathy with gangrene: Secondary | ICD-10-CM | POA: Insufficient documentation

## 2018-12-03 DIAGNOSIS — L97529 Non-pressure chronic ulcer of other part of left foot with unspecified severity: Secondary | ICD-10-CM | POA: Insufficient documentation

## 2018-12-03 DIAGNOSIS — I429 Cardiomyopathy, unspecified: Secondary | ICD-10-CM | POA: Insufficient documentation

## 2018-12-03 DIAGNOSIS — Z955 Presence of coronary angioplasty implant and graft: Secondary | ICD-10-CM | POA: Insufficient documentation

## 2018-12-03 DIAGNOSIS — E11621 Type 2 diabetes mellitus with foot ulcer: Secondary | ICD-10-CM | POA: Insufficient documentation

## 2018-12-03 DIAGNOSIS — Z7902 Long term (current) use of antithrombotics/antiplatelets: Secondary | ICD-10-CM | POA: Insufficient documentation

## 2018-12-03 DIAGNOSIS — I110 Hypertensive heart disease with heart failure: Secondary | ICD-10-CM | POA: Diagnosis not present

## 2018-12-03 DIAGNOSIS — Z882 Allergy status to sulfonamides status: Secondary | ICD-10-CM | POA: Insufficient documentation

## 2018-12-03 DIAGNOSIS — E1169 Type 2 diabetes mellitus with other specified complication: Secondary | ICD-10-CM | POA: Insufficient documentation

## 2018-12-03 DIAGNOSIS — I10 Essential (primary) hypertension: Secondary | ICD-10-CM | POA: Insufficient documentation

## 2018-12-03 DIAGNOSIS — E782 Mixed hyperlipidemia: Secondary | ICD-10-CM | POA: Diagnosis not present

## 2018-12-03 DIAGNOSIS — Z7982 Long term (current) use of aspirin: Secondary | ICD-10-CM | POA: Insufficient documentation

## 2018-12-03 DIAGNOSIS — Z9862 Peripheral vascular angioplasty status: Secondary | ICD-10-CM | POA: Insufficient documentation

## 2018-12-03 DIAGNOSIS — M86172 Other acute osteomyelitis, left ankle and foot: Secondary | ICD-10-CM | POA: Diagnosis not present

## 2018-12-03 DIAGNOSIS — Z794 Long term (current) use of insulin: Secondary | ICD-10-CM | POA: Insufficient documentation

## 2018-12-03 HISTORY — PX: AMPUTATION: SHX166

## 2018-12-03 LAB — GLUCOSE, CAPILLARY
Glucose-Capillary: 198 mg/dL — ABNORMAL HIGH (ref 70–99)
Glucose-Capillary: 178 mg/dL — ABNORMAL HIGH (ref 70–99)

## 2018-12-03 SURGERY — AMPUTATION, FOOT, RAY
Anesthesia: Monitor Anesthesia Care | Site: Toe | Laterality: Left

## 2018-12-03 MED ORDER — PHENYLEPHRINE 40 MCG/ML (10ML) SYRINGE FOR IV PUSH (FOR BLOOD PRESSURE SUPPORT)
PREFILLED_SYRINGE | INTRAVENOUS | Status: DC | PRN
  Administered 2018-12-03: 80 ug via INTRAVENOUS

## 2018-12-03 MED ORDER — FENTANYL CITRATE (PF) 100 MCG/2ML IJ SOLN
INTRAMUSCULAR | Status: AC
  Filled 2018-12-03: qty 2

## 2018-12-03 MED ORDER — TRAMADOL HCL 50 MG PO TABS: 50.0000 mg | ORAL_TABLET | Freq: Four times a day (QID) | ORAL | 0 refills | Status: AC | PRN

## 2018-12-03 MED ORDER — VANCOMYCIN HCL 500 MG IV SOLR
INTRAVENOUS | Status: AC
  Filled 2018-12-03: qty 500

## 2018-12-03 MED ORDER — LACTATED RINGERS IV SOLN
INTRAVENOUS | Status: DC
  Administered 2018-12-03: 12:00:00 via INTRAVENOUS

## 2018-12-03 MED ORDER — 0.9 % SODIUM CHLORIDE (POUR BTL) OPTIME
TOPICAL | Status: DC | PRN
  Administered 2018-12-03: 1000 mL

## 2018-12-03 MED ORDER — PROPOFOL 500 MG/50ML IV EMUL
INTRAVENOUS | Status: AC
  Filled 2018-12-03: qty 50

## 2018-12-03 MED ORDER — SCOPOLAMINE 1 MG/3DAYS TD PT72: 1.0000 | MEDICATED_PATCH | Freq: Once | TRANSDERMAL | Status: DC | PRN

## 2018-12-03 MED ORDER — PROPOFOL 10 MG/ML IV BOLUS
INTRAVENOUS | Status: DC | PRN
  Administered 2018-12-03: 10 mg via INTRAVENOUS
  Administered 2018-12-03: 20 mg via INTRAVENOUS
  Administered 2018-12-03 (×2): 10 mg via INTRAVENOUS

## 2018-12-03 MED ORDER — CEFAZOLIN SODIUM-DEXTROSE 2-4 GM/100ML-% IV SOLN
2.0000 g | INTRAVENOUS | Status: AC
  Administered 2018-12-03: 2 g via INTRAVENOUS

## 2018-12-03 MED ORDER — LIDOCAINE HCL 2 % IJ SOLN
INTRAMUSCULAR | Status: AC
  Filled 2018-12-03: qty 20

## 2018-12-03 MED ORDER — PROPOFOL 500 MG/50ML IV EMUL
INTRAVENOUS | Status: DC | PRN
  Administered 2018-12-03: 75 ug/kg/min via INTRAVENOUS

## 2018-12-03 MED ORDER — CHLORHEXIDINE GLUCONATE 4 % EX LIQD: 60.0000 mL | Freq: Once | CUTANEOUS | Status: DC

## 2018-12-03 MED ORDER — MIDAZOLAM HCL 2 MG/2ML IJ SOLN: 1.0000 mg | INTRAMUSCULAR | Status: DC | PRN

## 2018-12-03 MED ORDER — FENTANYL CITRATE (PF) 100 MCG/2ML IJ SOLN: 50.0000 ug | INTRAMUSCULAR | Status: DC | PRN

## 2018-12-03 MED ORDER — LIDOCAINE HCL (CARDIAC) PF 100 MG/5ML IV SOSY
PREFILLED_SYRINGE | INTRAVENOUS | Status: DC | PRN
  Administered 2018-12-03: 20 mg via INTRAVENOUS

## 2018-12-03 MED ORDER — PHENYLEPHRINE 40 MCG/ML (10ML) SYRINGE FOR IV PUSH (FOR BLOOD PRESSURE SUPPORT)
PREFILLED_SYRINGE | INTRAVENOUS | Status: AC
  Filled 2018-12-03: qty 10

## 2018-12-03 MED ORDER — BUPIVACAINE-EPINEPHRINE (PF) 0.5% -1:200000 IJ SOLN
INTRAMUSCULAR | Status: AC
  Filled 2018-12-03: qty 30

## 2018-12-03 MED ORDER — BUPIVACAINE-EPINEPHRINE 0.5% -1:200000 IJ SOLN
INTRAMUSCULAR | Status: DC | PRN
  Administered 2018-12-03: 10 mL

## 2018-12-03 MED ORDER — SODIUM CHLORIDE 0.9 % IV SOLN: INTRAVENOUS | Status: DC

## 2018-12-03 MED ORDER — LIDOCAINE 2% (20 MG/ML) 5 ML SYRINGE
INTRAMUSCULAR | Status: AC
  Filled 2018-12-03: qty 5

## 2018-12-03 MED ORDER — CEFAZOLIN SODIUM-DEXTROSE 2-4 GM/100ML-% IV SOLN
INTRAVENOUS | Status: AC
  Filled 2018-12-03: qty 100

## 2018-12-03 MED ORDER — VANCOMYCIN HCL 500 MG IV SOLR
INTRAVENOUS | Status: DC | PRN
  Administered 2018-12-03: 500 mg

## 2018-12-03 MED ORDER — FENTANYL CITRATE (PF) 100 MCG/2ML IJ SOLN
INTRAMUSCULAR | Status: DC | PRN
  Administered 2018-12-03: 25 ug via INTRAVENOUS
  Administered 2018-12-03: 50 ug via INTRAVENOUS
  Administered 2018-12-03: 25 ug via INTRAVENOUS

## 2018-12-03 SURGICAL SUPPLY — 65 items
APL PRP STRL LF DISP 70% ISPRP (MISCELLANEOUS) ×1
BLADE AVERAGE 25X9 (BLADE) ×1 IMPLANT
BLADE MICRO SAGITTAL (BLADE) IMPLANT
BLADE OSC/SAG .038X5.5 CUT EDG (BLADE) IMPLANT
BLADE SURG 10 STRL SS (BLADE) IMPLANT
BLADE SURG 15 STRL LF DISP TIS (BLADE) ×1 IMPLANT
BLADE SURG 15 STRL SS (BLADE) ×2
BNDG CMPR 9X4 STRL LF SNTH (GAUZE/BANDAGES/DRESSINGS) ×1
BNDG COHESIVE 4X5 TAN STRL (GAUZE/BANDAGES/DRESSINGS) ×2 IMPLANT
BNDG CONFORM 3 STRL LF (GAUZE/BANDAGES/DRESSINGS) IMPLANT
BNDG ESMARK 4X9 LF (GAUZE/BANDAGES/DRESSINGS) ×2 IMPLANT
CHLORAPREP W/TINT 26 (MISCELLANEOUS) ×2 IMPLANT
COVER BACK TABLE REUSABLE LG (DRAPES) ×2 IMPLANT
COVER WAND RF STERILE (DRAPES) IMPLANT
DECANTER SPIKE VIAL GLASS SM (MISCELLANEOUS) IMPLANT
DRAPE EXTREMITY T 121X128X90 (DISPOSABLE) ×2 IMPLANT
DRAPE OEC MINIVIEW 54X84 (DRAPES) IMPLANT
DRAPE SURG 17X23 STRL (DRAPES) ×1 IMPLANT
DRAPE U-SHAPE 47X51 STRL (DRAPES) IMPLANT
DRSG MEPITEL 4X7.2 (GAUZE/BANDAGES/DRESSINGS) ×2 IMPLANT
DRSG PAD ABDOMINAL 8X10 ST (GAUZE/BANDAGES/DRESSINGS) IMPLANT
ELECT REM PT RETURN 9FT ADLT (ELECTROSURGICAL) ×2
ELECTRODE REM PT RTRN 9FT ADLT (ELECTROSURGICAL) ×1 IMPLANT
GAUZE SPONGE 4X4 12PLY STRL (GAUZE/BANDAGES/DRESSINGS) ×2 IMPLANT
GLOVE BIO SURGEON STRL SZ7 (GLOVE) ×1 IMPLANT
GLOVE BIO SURGEON STRL SZ8 (GLOVE) ×2 IMPLANT
GLOVE BIOGEL PI IND STRL 8 (GLOVE) ×2 IMPLANT
GLOVE BIOGEL PI INDICATOR 8 (GLOVE) ×2
GLOVE ECLIPSE 8.0 STRL XLNG CF (GLOVE) ×2 IMPLANT
GLOVE SURG SYN 7.5  E (GLOVE) ×2
GLOVE SURG SYN 7.5 E (GLOVE) ×1 IMPLANT
GLOVE SURG SYN 7.5 PF PI (GLOVE) IMPLANT
GOWN STRL REUS W/ TWL LRG LVL3 (GOWN DISPOSABLE) ×1 IMPLANT
GOWN STRL REUS W/ TWL XL LVL3 (GOWN DISPOSABLE) ×2 IMPLANT
GOWN STRL REUS W/TWL LRG LVL3 (GOWN DISPOSABLE) ×2
GOWN STRL REUS W/TWL XL LVL3 (GOWN DISPOSABLE) ×4
NDL HYPO 25X1 1.5 SAFETY (NEEDLE) IMPLANT
NDL SAFETY ECLIPSE 18X1.5 (NEEDLE) IMPLANT
NEEDLE HYPO 18GX1.5 SHARP (NEEDLE)
NEEDLE HYPO 25X1 1.5 SAFETY (NEEDLE) ×2 IMPLANT
NS IRRIG 1000ML POUR BTL (IV SOLUTION) ×2 IMPLANT
PACK BASIN DAY SURGERY FS (CUSTOM PROCEDURE TRAY) ×2 IMPLANT
PAD CAST 4YDX4 CTTN HI CHSV (CAST SUPPLIES) ×1 IMPLANT
PADDING CAST ABS 4INX4YD NS (CAST SUPPLIES)
PADDING CAST ABS COTTON 4X4 ST (CAST SUPPLIES) IMPLANT
PADDING CAST COTTON 4X4 STRL (CAST SUPPLIES) ×2
PENCIL BUTTON HOLSTER BLD 10FT (ELECTRODE) IMPLANT
SANITIZER HAND PURELL 535ML FO (MISCELLANEOUS) ×2 IMPLANT
SHEET MEDIUM DRAPE 40X70 STRL (DRAPES) ×2 IMPLANT
SLEEVE SCD COMPRESS KNEE MED (MISCELLANEOUS) ×2 IMPLANT
SPONGE LAP 18X18 RF (DISPOSABLE) ×2 IMPLANT
STOCKINETTE 6  STRL (DRAPES) ×2
STOCKINETTE 6 STRL (DRAPES) ×1 IMPLANT
SUCTION FRAZIER HANDLE 10FR (MISCELLANEOUS)
SUCTION TUBE FRAZIER 10FR DISP (MISCELLANEOUS) IMPLANT
SUT ETHILON 2 0 FS 18 (SUTURE) ×1 IMPLANT
SUT ETHILON 2 0 FSLX (SUTURE) ×1 IMPLANT
SUT ETHILON 3 0 PS 1 (SUTURE) ×1 IMPLANT
SUT MNCRL AB 3-0 PS2 18 (SUTURE) ×1 IMPLANT
SWAB COLLECTION DEVICE MRSA (MISCELLANEOUS) IMPLANT
SYR BULB 3OZ (MISCELLANEOUS) ×2 IMPLANT
SYR CONTROL 10ML LL (SYRINGE) ×1 IMPLANT
TOWEL GREEN STERILE FF (TOWEL DISPOSABLE) ×2 IMPLANT
TUBE CONNECTING 20X1/4 (TUBING) IMPLANT
UNDERPAD 30X30 (UNDERPADS AND DIAPERS) ×2 IMPLANT

## 2018-12-03 NOTE — Discharge Instructions (Addendum)
Wylene Simmer, MD [place]  Please read the following information regarding your care after surgery.  Medications  You only need a prescription for the narcotic pain medicine (ex. oxycodone, Percocet, Norco).  All of the other medicines listed below are available over the counter. X acetominophen (Tylenol) 650 mg every 4-6 hours as you need for minor to moderate pain X tramadol as prescribed for severe pain  Narcotic pain medicine (ex. oxycodone, Percocet, Vicodin) will cause constipation.  To prevent this problem, take the following medicines while you are taking any pain medicine. X docusate sodium (Colace) 100 mg twice a day X senna (Senokot) 2 tablets twice a day  X To help prevent blood clots, resume plavix two days after surgery (Thursday).  You should also get up every hour while you are awake to move around.    Weight Bearing X Bear weight only on your operated foot in the post-op shoe.  Cast / Splint / Dressing X Keep your splint, cast or dressing clean and dry.  Dont put anything (coat hanger, pencil, etc) down inside of it.  If it gets damp, use a hair dryer on the cool setting to dry it.  If it gets soaked, call the office to schedule an appointment for a cast change.   After your dressing, cast or splint is removed; you may shower, but do not soak or scrub the wound.  Allow the water to run over it, and then gently pat it dry.  Swelling It is normal for you to have swelling where you had surgery.  To reduce swelling and pain, keep your toes above your nose for at least 3 days after surgery.  It may be necessary to keep your foot or leg elevated for several weeks.  If it hurts, it should be elevated.  Follow Up Call my office at [phone] when you are discharged from the hospital or surgery center to schedule an appointment to be seen two weeks after surgery.  Call my office at [phone] if you develop a fever >101.5 F, nausea, vomiting, bleeding from  the surgical site or severe pain.         Post Anesthesia Home Care Instructions  Activity: Get plenty of rest for the remainder of the day. A responsible individual must stay with you for 24 hours following the procedure.  For the next 24 hours, DO NOT: -Drive a car -Paediatric nurse -Drink alcoholic beverages -Take any medication unless instructed by your physician -Make any legal decisions or sign important papers.  Meals: Start with liquid foods such as gelatin or soup. Progress to regular foods as tolerated. Avoid greasy, spicy, heavy foods. If nausea and/or vomiting occur, drink only clear liquids until the nausea and/or vomiting subsides. Call your physician if vomiting continues.  Special Instructions/Symptoms: Your throat may feel dry or sore from the anesthesia or the breathing tube placed in your throat during surgery. If this causes discomfort, gargle with warm salt water. The discomfort should disappear within 24 hours.  If you had a scopolamine patch placed behind your ear for the management of post- operative nausea and/or vomiting:  1. The medication in the patch is effective for 72 hours, after which it should be removed.  Wrap patch in a tissue and discard in the trash. Wash hands thoroughly with soap and water. 2. You may remove the patch earlier than 72 hours if you experience unpleasant side effects which may include dry mouth, dizziness or visual disturbances. 3. Avoid touching the  patch. Wash your hands with soap and water after contact with the patch.

## 2018-12-03 NOTE — Anesthesia Preprocedure Evaluation (Addendum)
Anesthesia Evaluation  Patient identified by MRN, date of birth, ID band Patient awake    Reviewed: Allergy & Precautions, NPO status , Patient's Chart, lab work & pertinent test results, reviewed documented beta blocker date and time   History of Anesthesia Complications (+) PONV and history of anesthetic complications  Airway Mallampati: II  TM Distance: >3 FB Neck ROM: Full    Dental  (+) Dental Advisory Given   Pulmonary neg pulmonary ROS, former smoker,    breath sounds clear to auscultation       Cardiovascular hypertension, Pt. on medications and Pt. on home beta blockers (-) angina+ CAD, + Cardiac Stents and + Peripheral Vascular Disease   Rhythm:Regular Rate:Normal  '14 ECHO: EF 40-45%, mild inferolat hypokinesis, mod MR   Neuro/Psych Anxiety Peripheral neuropathy    GI/Hepatic Neg liver ROS, GERD  Medicated and Controlled,  Endo/Other  diabetes, Insulin DependentHypothyroidism   Renal/GU negative Renal ROS  negative genitourinary   Musculoskeletal negative musculoskeletal ROS (+)   Abdominal   Peds  Hematology negative hematology ROS (+)   Anesthesia Other Findings   Reproductive/Obstetrics negative OB ROS                             Anesthesia Physical  Anesthesia Plan  ASA: III  Anesthesia Plan: MAC   Post-op Pain Management: GA combined w/ Regional for post-op pain   Induction: Intravenous  PONV Risk Score and Plan:   Airway Management Planned: Simple Face Mask and Nasal Cannula  Additional Equipment:   Intra-op Plan:   Post-operative Plan: Extubation in OR  Informed Consent: I have reviewed the patients History and Physical, chart, labs and discussed the procedure including the risks, benefits and alternatives for the proposed anesthesia with the patient or authorized representative who has indicated his/her understanding and acceptance.     Dental  advisory given  Plan Discussed with: CRNA, Surgeon and Anesthesiologist  Anesthesia Plan Comments:      Anesthesia Quick Evaluation

## 2018-12-03 NOTE — Op Note (Signed)
12/03/2018  3:02 PM  PATIENT:  [name]  85 y.o. female  PRE-OPERATIVE DIAGNOSIS: 1.  Left diabetic foot ulcer      2.  Left 3rd toe gangrene      3.  Left 3rd MT osteomyelitis  pOST-OPERATIVE DIAGNOSIS:  Same  Procedure(s):  Left 3rd ray amputation  SURGEON:  Wylene Simmer, MD  ASSISTANT: Mechele Claude, PA-C  ANESTHESIA:  Local, MAC  EBL:  minimal   TOURNIQUET:  approx 10 min with ankle esmarch  COMPLICATIONS:  None apparent  DISPOSITION:  Extubated, awake and stable to recovery.  INDICATION FOR PROCEDURE: The patient is a 85 year old female with a past medical history significant for peripheral vascular disease and diabetes.  She has had a chronic left forefoot ulcer for many months.  She has developed gangrene of the toe and has radiographic signs of osteomyelitis.  She presents now for left third ray amputation.  The risks and benefits of the alternative treatment options have been discussed in detail.  The patient wishes to proceed with surgery and specifically understands risks of bleeding, infection, nerve damage, blood clots, need for additional surgery, amputation and death.  PROCEDURE IN DETAIL:  After pre operative consent was obtained, and the correct operative site was identified, the patient was brought to the operating room and placed supine on the OR table.  Anesthesia was administered.  Pre-operative antibiotics were administered.  A surgical timeout was taken.  The left lower extremity was prepped and draped in standard sterile fashion.  A metatarsal block was performed with half percent Marcaine with epinephrine.  The foot was exsanguinated and a 4 inch Esmarch tourniquet wrapped around the ankle.  A racquet style incision was made around the base of the toe.  It was extended plantarly to excise the ulcer.  Dissection was carried down to the level of the MTP joint, and the toe was disarticulated.  Dissection was then carried subperiosteally along the metatarsal.  An  oscillating saw was used to transect the metatarsal at the junction of the proximal and middle thirds.  The distal portion of the metatarsal was then removed in its entirety.  The wound was irrigated copiously.  There was no evidence of abscess.  Neurovascular bundles were cauterized.  Tourniquet was released.  Hemostasis was achieved.  Vancomycin powder was sprinkled in the wound.  The incision was closed with horizontal mattress sutures of 2-0 nylon.  Sterile dressings were applied followed by a compression wrap.  The patient was awakened from anesthesia and transported to the recovery room in stable condition.  FOLLOW UP PLAN: Heel weightbearing in a flat postop shoe.  Follow-up in the office in 2 weeks for suture removal.    Mechele Claude PA-C was present and scrubbed for the duration of the operative case. His assistance was essential in positioning the patient, prepping and draping, gaining and maintaining exposure, performing the operation, closing and dressing the wounds and applying the splint.

## 2018-12-03 NOTE — Anesthesia Postprocedure Evaluation (Signed)
Anesthesia Post Note  Patient: [name]  Procedure(s) Performed: Left 3rd ray amputation (Left Toe)     Patient location during evaluation: PACU Anesthesia Type: MAC Level of consciousness: awake and alert Pain management: pain level controlled Vital Signs Assessment: post-procedure vital signs reviewed and stable Respiratory status: spontaneous breathing, nonlabored ventilation, respiratory function stable and patient connected to nasal cannula oxygen Cardiovascular status: blood pressure returned to baseline and stable Postop Assessment: no apparent nausea or vomiting Anesthetic complications: no    Last Vitals:  Vitals:   12/03/18 1541 12/03/18 1545  BP:  (!) 152/59  Pulse: 64 (!) 57  Resp: 13 10  Temp:    SpO2: 100% 100%    Last Pain:  Vitals:   12/03/18 1552  TempSrc:   PainSc: 0-No pain      LLE Sensation: No pain (12/03/18 1552)          Jory Welke COKER

## 2018-12-03 NOTE — Transfer of Care (Signed)
Immediate Anesthesia Transfer of Care Note  Patient: NAVEAH BRAVE  Procedure(s) Performed: Left 3rd ray amputation (Left Toe)  Patient Location: PACU  Anesthesia Type:MAC  Level of Consciousness: awake, alert , oriented and patient cooperative  Airway & Oxygen Therapy: Patient Spontanous Breathing and Patient connected to nasal cannula oxygen  Post-op Assessment: Report given to RN and Post -op Vital signs reviewed and stable  Post vital signs: Reviewed and stable  Last Vitals:  Vitals Value Taken Time  BP 144/61 12/03/2018  3:03 PM  Temp    Pulse 69 12/03/2018  3:04 PM  Resp 17 12/03/2018  3:04 PM  SpO2 100 % 12/03/2018  3:04 PM  Vitals shown include unvalidated device data.  Last Pain:  Vitals:   12/03/18 1205  TempSrc: Oral  PainSc: 0-No pain      Patients Stated Pain Goal: 1 (35/70/17 7939)  Complications: No apparent anesthesia complications

## 2018-12-03 NOTE — H&P (Signed)
[name] is an 85 y.o. female.   Chief Complaint: Left foot gangrene HPI: The patient is an 85 year old female with a past medical history significant for diabetes and peripheral vascular disease.  She has a gangrenous third toe with underlying osteomyelitis.  She presents now for third ray amputation.  She has been off of Plavix for 5 days.  Past Medical History:  Diagnosis Date  . Anxiety   . CAD (coronary artery disease)   . Cellulitis 10/2015   LEFT FOOT  . Complication of anesthesia   . Coronary artery disease   . Diabetes mellitus without complication (HCC)    insulin dependent  . GERD (gastroesophageal reflux disease)   . Hypertension   . Hypothyroidism   . Neuromuscular disorder (HCC)    muscle cramps to lower extremities  . Other primary cardiomyopathies   . PONV (postoperative nausea and vomiting)   . Shortness of breath     Past Surgical History:  Procedure Laterality Date  . ABDOMINAL AORTOGRAM N/A 09/03/2018   Procedure: ABDOMINAL AORTOGRAM;  Surgeon: Serafina Mitchell, MD;  Location: [place] CV LAB;  Service: Cardiovascular;  Laterality: N/A;  . ABDOMINAL AORTOGRAM W/LOWER EXTREMITY N/A 04/16/2018   Procedure: ABDOMINAL AORTOGRAM W/LOWER EXTREMITY;  Surgeon: Serafina Mitchell, MD;  Location: [place] [facility];  Service: Cardiovascular;  Laterality: N/A;  unilateral  . ABDOMINAL HYSTERECTOMY    . CHOLECYSTECTOMY    . CORONARY ANGIOPLASTY WITH STENT PLACEMENT  08/09/2011   DES  to mid circumflex  . I&D EXTREMITY Left 10/29/2015   Procedure: Irrigation and Debridement Left Foot;  Surgeon: Newt Minion, MD;  Location: [place];  Service: Orthopedics;  Laterality: Left;  . LEFT HEART CATHETERIZATION WITH CORONARY ANGIOGRAM N/A 08/08/2012   Procedure: LEFT HEART CATHETERIZATION WITH CORONARY ANGIOGRAM;  Surgeon: [name], MD;  Location: [facility] CATH LAB;  Service: Cardiovascular;  Laterality: N/A;  . PERIPHERAL VASCULAR ATHERECTOMY  04/16/2018   Procedure:  PERIPHERAL VASCULAR ATHERECTOMY;  Surgeon: Serafina Mitchell, MD;  Location: [place] CV LAB;  Service: Cardiovascular;;  lt. Peroneal  . PERIPHERAL VASCULAR BALLOON ANGIOPLASTY  04/16/2018   Procedure: PERIPHERAL VASCULAR BALLOON ANGIOPLASTY;  Surgeon: Serafina Mitchell, MD;  Location: [place] CV LAB;  Service: Cardiovascular;;  lt. sfa and AT  . PERIPHERAL VASCULAR BALLOON ANGIOPLASTY Left 09/03/2018   Procedure: PERIPHERAL VASCULAR BALLOON ANGIOPLASTY;  Surgeon: Serafina Mitchell, MD;  Location: [place] CV [facility];  Service: Cardiovascular;  Laterality: Left;  TP TRUNK  . PERIPHERAL VASCULAR CATHETERIZATION N/A 12/30/2014   Procedure: Lower Extremity Angiography;  Surgeon: Wellington Hampshire, MD;  Location: [place] CV [facility];  Service: Cardiovascular;  Laterality: N/A;  . PERIPHERAL VASCULAR INTERVENTION Left 09/03/2018   Procedure: PERIPHERAL VASCULAR INTERVENTION;  Surgeon: Serafina Mitchell, MD;  Location: [place] [facility];  Service: Cardiovascular;  Laterality: Left;  SFA STENT   . THYROID SURGERY     radioactive iodine   . TONSILLECTOMY      Family History  Problem Relation Age of Onset  . Heart disease Father   . Heart attack Father   . Diabetes Sister   . Heart disease Son        before age 78  . Heart attack 18        85yrold  . Sudden death Grandchild   . Hypertension Neg Hx    Social History:  reports that she quit smoking about 58 years ago. She has never used smokeless  tobacco. She reports current alcohol use. She reports that she does not use drugs.  Allergies:  Allergies  Allergen Reactions  . Sulfa Antibiotics Nausea And Vomiting    Severe vomiting.     Medications Prior to Admission  Medication Sig Dispense Refill  . amLODipine (NORVASC) 10 MG tablet TAKE 1 TABLET BY MOUTH ONCE DAILY 90 tablet 0  . aspirin EC 325 MG tablet Take 325 mg by mouth at bedtime.     Marland Kitchen atorvastatin (LIPITOR) 20 MG tablet Take 1 tablet (20 mg total) by mouth daily. (Patient  taking differently: Take 20 mg by mouth every evening. ) 90 tablet 0  . Biotin 5000 MCG CAPS Take 5,000 mcg by mouth daily.    . carvedilol (COREG) 25 MG tablet Take 1 tablet (25 mg total) by mouth 2 (two) times daily with a meal. *Please call and schedule an appointment with Dr Fletcher Anon* 60 tablet 0  . cholecalciferol (VITAMIN D) 1000 units tablet Take 2,000 Units by mouth daily.     . clopidogrel (PLAVIX) 75 MG tablet Take 1 tablet (75 mg total) by mouth daily. (Patient taking differently: Take 75 mg by mouth every evening. ) 30 tablet 11  . Coenzyme Q10 (COQ-10) 200 MG CAPS Take 200 mg by mouth daily.    Marland Kitchen doxycycline (VIBRA-TABS) 100 MG tablet Take 1 tablet (100 mg total) by mouth 2 (two) times daily. 28 tablet 1  . LEVEMIR FLEXTOUCH 100 UNIT/ML Pen Inject 70 Units into the skin every evening.     Marland Kitchen lisinopril (PRINIVIL,ZESTRIL) 20 MG tablet Take 20 mg by mouth every evening.     Marland Kitchen NOVOLOG FLEXPEN 100 UNIT/ML FlexPen Inject 10 units with breakfast and 16 units with lunch and dinner    . pantoprazole (PROTONIX) 40 MG tablet TAKE 1 TABLET BY MOUTH ONCE DAILY AT  6AM (Patient taking differently: Take 40 mg by mouth every evening. ) 90 tablet 2  . Vitamin D, Ergocalciferol, (DRISDOL) 50000 units CAPS capsule Take 50,000 Units by mouth every Friday.   11  . Wound Dressings (MEDIHONEY WOUND/BURN DRESSING) GEL Apply 1 application topically daily.    . nitroGLYCERIN (NITROSTAT) 0.4 MG SL tablet PLACE 1 TABLET UNDER THE TONGUE EVERY 5 MINUTES AS NEEDED FOR CHEST PAIN 25 tablet 0    Results for orders placed or performed during the hospital encounter of 12/03/18 (from the past 48 hour(s))  Glucose, capillary     Status: Abnormal   Collection Time: 12/03/18 12:27 PM  Result Value Ref Range   Glucose-Capillary 198 (H) 70 - 99 mg/dL   No results found.  ROS no recent fever, chills, nausea, vomiting or changes in her appetite  Blood pressure (!) 178/60, pulse 72, temperature 98 F (36.7 C), temperature  source Oral, resp. rate 18, height '5\' 8"'$  (1.727 m), weight 77.4 kg, SpO2 100 %. Physical Exam  Well-nourished well-developed elderly woman in no apparent distress.  Alert and oriented x4.  Mood and affect are normal.  Extraocular motions are intact.  Respirations are unlabored.  Gait is normal.  The left foot has a gangrenous third toe with an ulcer plantarly and distally.  1+ dorsalis pedis pulse.  Diminished sensibility to light touch at the forefoot dorsally and plantarly.  5 out of 5 strength in plantar flexion and dorsiflexion of the ankle and toes.  No lymphadenopathy or lymphangitis.  Assessment/Plan Left third toe diabetic ulcer, osteomyelitis and gangrene -to the operating room today for left third ray amputation.  The risks  and benefits of the alternative treatment options have been discussed in detail.  The patient wishes to proceed with surgery and specifically understands risks of bleeding, infection, nerve damage, blood clots, need for additional surgery, amputation and death.   Wylene Simmer, MD Dec 25, 2018, 2:12 PM

## 2018-12-04 ENCOUNTER — Encounter (HOSPITAL_BASED_OUTPATIENT_CLINIC_OR_DEPARTMENT_OTHER): Payer: Self-pay | Admitting: Orthopedic Surgery

## 2018-12-04 ENCOUNTER — Ambulatory Visit: Payer: Self-pay | Admitting: Pharmacist

## 2018-12-06 DIAGNOSIS — S98139A Complete traumatic amputation of one unspecified lesser toe, initial encounter: Secondary | ICD-10-CM | POA: Insufficient documentation

## 2018-12-30 ENCOUNTER — Other Ambulatory Visit: Payer: Self-pay

## 2018-12-30 DIAGNOSIS — I779 Disorder of arteries and arterioles, unspecified: Secondary | ICD-10-CM

## 2018-12-30 DIAGNOSIS — M86672 Other chronic osteomyelitis, left ankle and foot: Secondary | ICD-10-CM

## 2019-01-01 ENCOUNTER — Telehealth (HOSPITAL_COMMUNITY): Payer: Self-pay | Admitting: *Deleted

## 2019-01-01 NOTE — Telephone Encounter (Signed)
The above patient or their representative was contacted and gave the following answers to these questions:         Do you have any of the following symptoms?n  Fever                    Cough                   Shortness of breath  Do  you have any of the following other symptoms? n muscle pain         vomiting,        diarrhea        rash         weakness        red eye        abdominal pain         bruising          bruising or bleeding              joint pain           severe headache    Have you been in contact with someone who was or has been sick in the past 2 weeks?n  Yes                 Unsure                         Unable to assess   Does the person that you were in contact with have any of the following symptoms?   Cough         shortness of breath           muscle pain         vomiting,            diarrhea            rash            weakness           fever            red eye           abdominal pain           bruising  or  bleeding                joint pain                severe headache               Have you  or someone you have been in contact with traveled internationally in th last month?   n      If yes, which countries?   Have you  or someone you have been in contact with traveled outside [place] in th last month?         If yes, which state and city?   COMMENTS OR ACTION PLAN FOR THIS PATIENT:          

## 2019-01-02 ENCOUNTER — Ambulatory Visit: Payer: PPO | Admitting: Interventional Cardiology

## 2019-01-03 ENCOUNTER — Other Ambulatory Visit: Payer: Self-pay

## 2019-01-03 ENCOUNTER — Encounter: Payer: Self-pay | Admitting: Family

## 2019-01-03 ENCOUNTER — Ambulatory Visit (HOSPITAL_COMMUNITY)
Admission: RE | Admit: 2019-01-03 | Discharge: 2019-01-03 | Disposition: A | Discharge status: Home / Self Care | Payer: HMO | Source: Ambulatory Visit | Attending: Family | Admitting: Family

## 2019-01-03 ENCOUNTER — Ambulatory Visit (INDEPENDENT_AMBULATORY_CARE_PROVIDER_SITE_OTHER)
Admission: RE | Admit: 2019-01-03 | Discharge: 2019-01-03 | Disposition: A | Discharge status: Home / Self Care | Payer: HMO | Source: Ambulatory Visit | Attending: Family | Admitting: Family

## 2019-01-03 ENCOUNTER — Ambulatory Visit (INDEPENDENT_AMBULATORY_CARE_PROVIDER_SITE_OTHER): Payer: HMO | Admitting: Family

## 2019-01-03 VITALS — BP 148/70 | HR 65 | Temp 97.6°F | Resp 16 | Ht 68.0 in | Wt 165.0 lb

## 2019-01-03 DIAGNOSIS — I779 Disorder of arteries and arterioles, unspecified: Secondary | ICD-10-CM | POA: Diagnosis not present

## 2019-01-03 DIAGNOSIS — Z89422 Acquired absence of other left toe(s): Secondary | ICD-10-CM

## 2019-01-03 DIAGNOSIS — E1151 Type 2 diabetes mellitus with diabetic peripheral angiopathy without gangrene: Secondary | ICD-10-CM

## 2019-01-03 DIAGNOSIS — M86672 Other chronic osteomyelitis, left ankle and foot: Secondary | ICD-10-CM | POA: Diagnosis not present

## 2019-01-03 NOTE — Patient Instructions (Signed)
Peripheral Vascular Disease  Peripheral vascular disease (PVD) is a disease of the blood vessels that are not part of your heart and brain. A simple term for PVD is poor circulation. In most cases, PVD narrows the blood vessels that carry blood from your heart to the rest of your body. This can reduce the supply of blood to your arms, legs, and internal organs, like your stomach or kidneys. However, PVD most often affects a person's lower legs and feet. Without treatment, PVD tends to get worse. PVD can also lead to acute ischemic limb. This is when an arm or leg suddenly cannot get enough blood. This is a medical emergency. Follow these instructions at home: Lifestyle  Do not use any products that contain nicotine or tobacco, such as cigarettes and e-cigarettes. If you need help quitting, ask your doctor.  Lose weight if you are overweight. Or, stay at a healthy weight as told by your doctor.  Eat a diet that is low in fat and cholesterol. If you need help, ask your doctor.  Exercise regularly. Ask your doctor for activities that are right for you. General instructions  Take over-the-counter and prescription medicines only as told by your doctor.  Take good care of your feet: ? Wear comfortable shoes that fit well. ? Check your feet often for any cuts or sores.  Keep all follow-up visits as told by your doctor This is important. Contact a doctor if:  You have cramps in your legs when you walk.  You have leg pain when you are at rest.  You have coldness in a leg or foot.  Your skin changes.  You are unable to get or have an erection (erectile dysfunction).  You have cuts or sores on your feet that do not heal. Get help right away if:  Your arm or leg turns cold, numb, and blue.  Your arms or legs become red, warm, swollen, painful, or numb.  You have chest pain.  You have trouble breathing.  You suddenly have weakness in your face, arm, or leg.  You become very  confused or you cannot speak.  You suddenly have a very bad headache.  You suddenly cannot see. Summary  Peripheral vascular disease (PVD) is a disease of the blood vessels.  A simple term for PVD is poor circulation. Without treatment, PVD tends to get worse.  Treatment may include exercise, low fat and low cholesterol diet, and quitting smoking. This information is not intended to replace advice given to you by your health care provider. Make sure you discuss any questions you have with your health care provider. Document Released: 09/27/2009 Document Revised: 08/10/2016 Document Reviewed: 08/10/2016 Elsevier Interactive Patient Education  2019 Elsevier Inc.  

## 2019-01-03 NOTE — Progress Notes (Signed)
[facility] [place]   CC: Follow up peripheral artery occlusive disease  History of Present Illness [name] is a 85 y.o. female who is s/p stent placement in left superficial femoral artery and balloon angioplasty of left tibioperoneal trunk and peroneal artery on 09-03-18 by Dr. Trula Slade for non healing left leg ulcer.  On 04-16-18 Dr. Trula Slade performed a balloon angioplasty of left superficial femoral artery, atherectomy with angioplasty of left peroneal artery, and angioplasty of left anterior tibial artery for left leg ulcer.   On 10-29-15 Dr. Sharol Given performed Irrigation and Debridement Left Foot with excision of skin soft tissue and tendon, and local tissue rearrangement for wound closure 1 x 4 cm for Abscess 2 and 3 Metatarsal Head Left Foot.  She is also s/p left 3rd toe ray amputation on 12-03-18 by Dr. Doran Durand for osteomyelitis of the third MTPJ left foot.  She denies any known hx of stroke or TIA.   She has a cardiac stent, sees Dr. Irish Lack, whom pt states referred her to [facility].    Diabetic: Yes, states her last A1C was about 9, uncontrolled  Tobacco use: never used tobacco per pt  Pt meds include: Statin :Yes Betablocker: Yes ASA: Yes Other anticoagulants/antiplatelets: Plavix   Past Medical History:  Diagnosis Date  . Anxiety   . CAD (coronary artery disease)   . Cellulitis 10/2015   LEFT FOOT  . Complication of anesthesia   . Coronary artery disease   . Diabetes mellitus without complication (HCC)    insulin dependent  . GERD (gastroesophageal reflux disease)   . Hypertension   . Hypothyroidism   . Neuromuscular disorder (HCC)    muscle cramps to lower extremities  . Other primary cardiomyopathies   . PONV (postoperative nausea and vomiting)   . Shortness of breath     Social History Social History   Tobacco Use  . Smoking status: Former Smoker    Quit date: 11/28/1960    Years since quitting: 58.1  . Smokeless tobacco: Never  Used  Substance Use Topics  . Alcohol use: Yes    Alcohol/week: 0.0 standard drinks    Comment: rare  . Drug use: No    Family History Family History  Problem Relation Age of Onset  . Heart disease Father   . Heart attack Father   . Diabetes Sister   . Heart disease Son        before age 19  . Heart attack 40        83yr old  . Sudden death Grandchild   . Hypertension Neg Hx     Past Surgical History:  Procedure Laterality Date  . ABDOMINAL AORTOGRAM N/A 09/03/2018   Procedure: ABDOMINAL AORTOGRAM;  Surgeon: Serafina Mitchell, MD;  Location: [facility];  Service: Cardiovascular;  Laterality: N/A;  . ABDOMINAL AORTOGRAM W/LOWER EXTREMITY N/A 04/16/2018   Procedure: ABDOMINAL AORTOGRAM W/LOWER EXTREMITY;  Surgeon: Serafina Mitchell, MD;  Location: [facility];  Service: Cardiovascular;  Laterality: N/A;  unilateral  . ABDOMINAL HYSTERECTOMY    . AMPUTATION Left 12/03/2018   Procedure: Left 3rd ray amputation;  Surgeon: Wylene Simmer, MD;  Location: [facility];  Service: Orthopedics;  Laterality: Left;  15min  . CHOLECYSTECTOMY    . CORONARY ANGIOPLASTY WITH STENT PLACEMENT  08/09/2011   DES  to mid circumflex  . I&D EXTREMITY Left 10/29/2015   Procedure: Irrigation and Debridement Left Foot;  Surgeon: Newt Minion, MD;  Location: [facility];  Service: Orthopedics;  Laterality: Left;  . LEFT HEART CATHETERIZATION WITH CORONARY ANGIOGRAM N/A 08/08/2012   Procedure: LEFT HEART CATHETERIZATION WITH CORONARY ANGIOGRAM;  Surgeon: [name], MD;  Location: [facility];  Service: Cardiovascular;  Laterality: N/A;  . PERIPHERAL VASCULAR ATHERECTOMY  04/16/2018   Procedure: PERIPHERAL VASCULAR ATHERECTOMY;  Surgeon: Serafina Mitchell, MD;  Location: [facility];  Service: Cardiovascular;;  lt. Peroneal  . PERIPHERAL VASCULAR BALLOON ANGIOPLASTY  04/16/2018   Procedure: PERIPHERAL VASCULAR BALLOON ANGIOPLASTY;  Surgeon: Serafina Mitchell, MD;   Location: [facility];  Service: Cardiovascular;;  lt. sfa and AT  . PERIPHERAL VASCULAR BALLOON ANGIOPLASTY Left 09/03/2018   Procedure: PERIPHERAL VASCULAR BALLOON ANGIOPLASTY;  Surgeon: Serafina Mitchell, MD;  Location: [facility];  Service: Cardiovascular;  Laterality: Left;  TP TRUNK  . PERIPHERAL VASCULAR CATHETERIZATION N/A 12/30/2014   Procedure: Lower Extremity Angiography;  Surgeon: Wellington Hampshire, MD;  Location: [facility];  Service: Cardiovascular;  Laterality: N/A;  . PERIPHERAL VASCULAR INTERVENTION Left 09/03/2018   Procedure: PERIPHERAL VASCULAR INTERVENTION;  Surgeon: Serafina Mitchell, MD;  Location: [facility];  Service: Cardiovascular;  Laterality: Left;  SFA STENT   . THYROID SURGERY     radioactive iodine   . TONSILLECTOMY      Allergies  Allergen Reactions  . Sulfa Antibiotics Nausea And Vomiting    Severe vomiting.     Current Outpatient Medications  Medication Sig Dispense Refill  . amLODipine (NORVASC) 10 MG tablet TAKE 1 TABLET BY MOUTH ONCE DAILY 90 tablet 0  . aspirin EC 325 MG tablet Take 325 mg by mouth at bedtime.     Marland Kitchen atorvastatin (LIPITOR) 20 MG tablet Take 1 tablet (20 mg total) by mouth daily. (Patient taking differently: Take 20 mg by mouth every evening. ) 90 tablet 0  . Biotin 5000 MCG CAPS Take 5,000 mcg by mouth daily.    . carvedilol (COREG) 25 MG tablet Take 1 tablet (25 mg total) by mouth 2 (two) times daily with a meal. *Please call and schedule an appointment with Dr Fletcher Anon* 60 tablet 0  . cholecalciferol (VITAMIN D) 1000 units tablet Take 2,000 Units by mouth daily.     . clopidogrel (PLAVIX) 75 MG tablet Take 1 tablet (75 mg total) by mouth daily. (Patient taking differently: Take 75 mg by mouth every evening. ) 30 tablet 11  . Coenzyme Q10 (COQ-10) 200 MG CAPS Take 200 mg by mouth daily.    Marland Kitchen LEVEMIR FLEXTOUCH 100 UNIT/ML Pen Inject 70 Units into the skin every evening.     Marland Kitchen lisinopril (PRINIVIL,ZESTRIL) 20 MG  tablet Take 20 mg by mouth every evening.     . nitroGLYCERIN (NITROSTAT) 0.4 MG SL tablet PLACE 1 TABLET UNDER THE TONGUE EVERY 5 MINUTES AS NEEDED FOR CHEST PAIN 25 tablet 0  . NOVOLOG FLEXPEN 100 UNIT/ML FlexPen Inject 10 units with breakfast and 16 units with lunch and dinner    . pantoprazole (PROTONIX) 40 MG tablet TAKE 1 TABLET BY MOUTH ONCE DAILY AT  6AM (Patient taking differently: Take 40 mg by mouth every evening. ) 90 tablet 2  . Vitamin D, Ergocalciferol, (DRISDOL) 50000 units CAPS capsule Take 50,000 Units by mouth every Friday.   11  . Wound Dressings (MEDIHONEY WOUND/BURN DRESSING) GEL Apply 1 application topically daily.    Marland Kitchen doxycycline (VIBRA-TABS) 100 MG tablet Take 1 tablet (100 mg total) by mouth 2 (  two) times daily. (Patient not taking: Reported on 01/03/2019) 28 tablet 1   No current facility-administered medications for this visit.     ROS: See HPI for pertinent positives and negatives.   Physical Examination  Vitals:   01/03/19 1145  BP: (!) 148/70  Pulse: 65  Resp: 16  Temp: 97.6 F (36.4 C)  TempSrc: Temporal  SpO2: 100%  Weight: 165 lb (74.8 kg)  Height: 5\' 8"  (1.727 m)   Body mass index is 25.09 kg/m.  General: A&O x 3, WDWN, female. Gait: steady, using cane HENT: No gross abnormalities.  Eyes: PERRLA. Pulmonary: Respirations are non labored, CTAB, good air movement in all fields Cardiac: regular rhythm, no detected murmur.         Carotid Bruits Right Left   Negative Negative   Radial pulses are 2+ palpable bilaterally   Adominal aortic pulse is not palpable                         VASCULAR EXAM: Extremities without ischemic changes, without Gangrene; without open wounds. Surgically absent left 3rd toe, site well healed.                                                                                                          LE Pulses Right Left       FEMORAL  2+ palpable  2+ palpable        POPLITEAL  not palpable   not palpable        POSTERIOR TIBIAL  not palpable   not palpable        DORSALIS PEDIS      ANTERIOR TIBIAL not palpable  2+ palpable    Abdomen: soft, NT, no palpable masses. Skin: no rashes, no cellulitis, no ulcers noted. Musculoskeletal: no muscle wasting or atrophy.  Neurologic: A&O X 3; appropriate affect, Sensation is normal; MOTOR FUNCTION:  moving all extremities equally, motor strength 5/5 throughout. Speech is fluent/normal. CN 2-12 intact. Psychiatric: Thought content is normal, mood appropriate for clinical situation.    ASSESSMENT: KYLINN SHROPSHIRE is a 85 y.o. female who is s/p stent placement in left superficial femoral artery and balloon angioplasty of left tibioperoneal trunk and peroneal artery on 09-03-18 by Dr. Trula Slade for non healing left leg ulcer.  On 04-16-18 Dr. Trula Slade performed a balloon angioplasty of left superficial femoral artery, atherectomy with angioplasty of left peroneal artery, and angioplasty of left anterior tibial artery for left leg ulcer.   On 10-29-15 Dr. Sharol Given performed Irrigation and Debridement Left Foot with excision of skin soft tissue and tendon, and local tissue rearrangement for wound closure 1 x 4 cm for Abscess 2 and 3 Metatarsal Head Left Foot.  She is also s/p left 3rd toe ray amputation on 12-03-18 by Dr. Doran Durand for osteomyelitis of the third MTPJ left foot. This site is well healed.  There are no signs of ischemia in her feet or legs.  Left DP pulse is 2+palpable.   Today's left LE arterial duplex shows a  patent left stent with no evidence for restenosis.    ABI's remain stable: moderate disease in the right, normal in the left; monophasic waveforms in the right, brisk monophasic in the left.    DATA  Left LE Arterial Duplex (01-03-19): +--------+--------+-----+--------+----------+--------+ LEFT    PSV cm/sRatioStenosisWaveform  Comments +--------+--------+-----+--------+----------+--------+ CFA Prox121                  triphasic           +--------+--------+-----+--------+----------+--------+ DFA     141                  triphasic          +--------+--------+-----+--------+----------+--------+ SFA Prox102                  monophasic         +--------+--------+-----+--------+----------+--------+ POP Prox85                   monophasic         +--------+--------+-----+--------+----------+--------+ TP Trunk99                   monophasicbrisk    +--------+--------+-----+--------+----------+--------+    Left Stent(s): +---------------+---++----------+--------+ Prox to Stent  93 monophasic         +---------------+---++----------+--------+ Proximal Stent 72 biphasic           +---------------+---++----------+--------+ Mid Stent      83 biphasic  dampened +---------------+---++----------+--------+ Distal Stent   74 biphasic  dampened +---------------+---++----------+--------+ Distal to Stent117biphasic           +---------------+---++----------+--------+  Enlarged lymph node. Summary: Left: Patent left stent with no evidence for restenosis.     ABI (Date: 01/03/2019): ABI Findings: +---------+------------------+-----+----------+--------+ Right    Rt Pressure (mmHg)IndexWaveform  Comment  +---------+------------------+-----+----------+--------+ Brachial 164                                       +---------+------------------+-----+----------+--------+ PTA      120               0.73 monophasic         +---------+------------------+-----+----------+--------+ DP       104               0.63 monophasic         +---------+------------------+-----+----------+--------+ Great Toe38                0.23 Abnormal           +---------+------------------+-----+----------+--------+  +---------+------------------+-----+----------+-------+ Left     Lt Pressure (mmHg)IndexWaveform   Comment +---------+------------------+-----+----------+-------+ Brachial 152                                      +---------+------------------+-----+----------+-------+ PTA      153               0.93 monophasicbrisk   +---------+------------------+-----+----------+-------+ DP       173               1.05 monophasicbrisk   +---------+------------------+-----+----------+-------+ Great Toe74                0.45 Abnormal          +---------+------------------+-----+----------+-------+  +-------+-----------+-----------+------------+------------+ ABI/TBIToday's ABIToday's TBIPrevious ABIPrevious TBI +-------+-----------+-----------+------------+------------+ Right  0.73       0.23  0.68        0.37         +-------+-----------+-----------+------------+------------+ Left   1.05       0.45       1.03        -            +-------+-----------+-----------+------------+------------+   Summary: Right: Resting right ankle-brachial index indicates moderate right lower extremity arterial disease. The right toe-brachial index is abnormal.  Left: Resting left ankle-brachial index is within normal range. There appears to be hyperemic flow. . No evidence of significant left lower extremity arterial disease. The left toe-brachial index is abnormal.   PLAN:  Based on the patient's vascular studies and examination, pt will return to clinic in 3 months with left LE arterial duplex and ABI's. Continue daily seated leg exercises, and gradually increase walking.   I discussed in depth with the patient the nature of atherosclerosis, and emphasized the importance of maximal medical management including strict control of blood pressure, blood glucose, and lipid levels, obtaining regular exercise, and continued cessation of smoking.  The patient is aware that without maximal medical management the underlying atherosclerotic disease process will progress, limiting  the benefit of any interventions.  The patient was given information about PAD including signs, symptoms, treatment, what symptoms should prompt the patient to seek immediate medical care, and risk reduction measures to take.  Clemon Chambers, RN, MSN, FNP-C [facility] Phone: [phone]  Clinic MD: Donzetta Matters  01/03/19 12:28 PM

## 2019-01-06 ENCOUNTER — Other Ambulatory Visit: Payer: Self-pay | Admitting: Pharmacist

## 2019-01-06 NOTE — Patient Outreach (Signed)
[facility]) Care Management  01/06/2019  [name] August 07, 1931 [id number]   Patient was called for CSNP quarterly follow up. HIPAA identifiers were obtained. Patient stated she was feeling well and was not having any problems with her medications.  She had been switched to [employer] and Humalog for Levemir and Novolog for patient assistance reasons.   Medications were reviewed:  Medications Reviewed Today    Reviewed by Elayne Guerin, [facility] (Pharmacist) on 01/06/19 at 1128  Med List Status: <None>  Medication Order Taking? Sig Documenting Provider Last Dose Status Informant  amLODipine (NORVASC) 10 MG tablet [id number] Yes TAKE 1 TABLET BY MOUTH ONCE DAILY [name], MD Taking Active   aspirin EC 325 MG tablet [id number] Yes Take 325 mg by mouth at bedtime.  [provider] Taking Active Self  atorvastatin (LIPITOR) 20 MG tablet [id number] Yes Take 1 tablet (20 mg total) by mouth daily. [name], MD Taking Active Self  Biotin 5000 MCG CAPS [id number] Yes Take 5,000 mcg by mouth daily. [provider] Taking Active Self  carvedilol (COREG) 25 MG tablet [id number] Yes Take 1 tablet (25 mg total) by mouth 2 (two) times daily with a meal. *Please call and schedule an appointment with Dr Fletcher AnonFletcher Anon, Mertie Clause, MD Taking Active Self  cholecalciferol (VITAMIN D) 1000 units tablet [id number] Yes Take 2,000 Units by mouth daily.  [provider] Taking Active Self  clopidogrel (PLAVIX) 75 MG tablet [id number] Yes Take 1 tablet (75 mg total) by mouth daily. Serafina Mitchell, MD Taking Active Self  Coenzyme Q10 (COQ-10) 200 MG CAPS [id number] Yes Take 200 mg by mouth daily. [provider] Taking Active Self  Insulin Glargine [facility]) [id number] Yes Inject 70 Units into the skin every evening. [provider] Taking Active   insulin lispro (HUMALOG) 100 UNIT/ML injection [id number] Yes Inject 10 units with  breakfast and 16 units with lunch and dinner, [provider] Taking Active         Discontinued 01/06/19 1128 (Change in therapy)            Med Note Kenton Kingfisher, YOLINDA T   Thu Aug 29, 2018  1:02 PM)    lisinopril (PRINIVIL,ZESTRIL) 20 MG tablet [id number] Yes Take 20 mg by mouth every evening.  [provider] Taking Active Self           Med Note Eric Form Dec 30, 2015  8:48 AM)    nitroGLYCERIN (NITROSTAT) 0.4 MG SL tablet [id number] Yes PLACE 1 TABLET UNDER THE TONGUE EVERY 5 MINUTES AS NEEDED FOR CHEST PAIN [name], MD Taking Active         Discontinued 01/06/19 1127 (Change in therapy)            Med Note Liliane Bade Aug 29, 2018  1:02 PM)    pantoprazole (PROTONIX) 40 MG tablet [id number] Yes TAKE 1 TABLET BY MOUTH ONCE DAILY AT  6AM [name], MD Taking Active Self  Vitamin D, Ergocalciferol, (DRISDOL) 50000 units CAPS capsule [id number] Yes Take 50,000 Units by mouth every Friday.  [provider] Taking Active Self  Wound Dressings (MEDIHONEY WOUND/BURN DRESSING) GEL [id number] Yes Apply 1 application topically daily. [provider] Taking Active Self          Plan: Follow up with patient in 4-6 months.  Elayne Guerin, PharmD, [place] Clinical Pharmacist [phone]

## 2019-01-08 ENCOUNTER — Telehealth: Payer: Self-pay

## 2019-01-08 NOTE — Telephone Encounter (Signed)

## 2019-01-12 NOTE — Progress Notes (Signed)
Cardiology Office Note   Date:  01/13/2019   ID:  Kadin, Bera September 14, 1931, MRN [id number]  PCP:  Marton Redwood, MD    No chief complaint on file.  CAD  Wt Readings from Last 3 Encounters:  01/13/19 173 lb 12.8 oz (78.8 kg)  01/03/19 165 lb (74.8 kg)  12/03/18 170 lb 10.2 oz (77.4 kg)       History of Present Illness: [name] is a 85 y.o. female  who had a circumflex stent placed in Jan 2014. She had a PTCA of the OM. She has a CTO of the RCA with good collaterals. She has not had anginal symptomson medical therapy.  She has had a chronic sore on her foot. She had a vascular w/u that showed PAD but no revasc could be performed. It had healed, but then recurred. She is followed by a podiatrist for the left foot wound.  SHe continues to do dressing changes.  THings had now progressed to the point of needing toes amputated.   She also has a chest mass which is enlarging thyroid, multinodular. THere is tracheal deviation.   Difficulty controlling her diabetes. SHe has had some low glucoses as well while trying for tighter control.   Denies : Chest pain. Dizziness. Leg edema. Nitroglycerin use. Orthopnea. Palpitations. Paroxysmal nocturnal dyspnea. Shortness of breath. Syncope.   Walking limited.    Past Medical History:  Diagnosis Date  . Anxiety   . CAD (coronary artery disease)   . Cellulitis 10/2015   LEFT FOOT  . Complication of anesthesia   . Coronary artery disease   . Diabetes mellitus without complication (HCC)    insulin dependent  . GERD (gastroesophageal reflux disease)   . Hypertension   . Hypothyroidism   . Neuromuscular disorder (HCC)    muscle cramps to lower extremities  . Other primary cardiomyopathies   . PONV (postoperative nausea and vomiting)   . Shortness of breath     Past Surgical History:  Procedure Laterality Date  . ABDOMINAL AORTOGRAM N/A 09/03/2018   Procedure: ABDOMINAL AORTOGRAM;  Surgeon: Serafina Mitchell, MD;   Location: [place] [facility];  Service: Cardiovascular;  Laterality: N/A;  . ABDOMINAL AORTOGRAM W/LOWER EXTREMITY N/A 04/16/2018   Procedure: ABDOMINAL AORTOGRAM W/LOWER EXTREMITY;  Surgeon: Serafina Mitchell, MD;  Location: [place] [facility];  Service: Cardiovascular;  Laterality: N/A;  unilateral  . ABDOMINAL HYSTERECTOMY    . AMPUTATION Left 12/03/2018   Procedure: Left 3rd ray amputation;  Surgeon: Wylene Simmer, MD;  Location: [facility];  Service: Orthopedics;  Laterality: Left;  51min  . CHOLECYSTECTOMY    . CORONARY ANGIOPLASTY WITH STENT PLACEMENT  08/09/2011   DES  to mid circumflex  . I&D EXTREMITY Left 10/29/2015   Procedure: Irrigation and Debridement Left Foot;  Surgeon: Newt Minion, MD;  Location: [place];  Service: Orthopedics;  Laterality: Left;  . LEFT HEART CATHETERIZATION WITH CORONARY ANGIOGRAM N/A 08/08/2012   Procedure: LEFT HEART CATHETERIZATION WITH CORONARY ANGIOGRAM;  Surgeon: [name], MD;  Location: [facility];  Service: Cardiovascular;  Laterality: N/A;  . PERIPHERAL VASCULAR ATHERECTOMY  04/16/2018   Procedure: PERIPHERAL VASCULAR ATHERECTOMY;  Surgeon: Serafina Mitchell, MD;  Location: [place] [facility];  Service: Cardiovascular;;  lt. Peroneal  . PERIPHERAL VASCULAR BALLOON ANGIOPLASTY  04/16/2018   Procedure: PERIPHERAL VASCULAR BALLOON ANGIOPLASTY;  Surgeon: Serafina Mitchell, MD;  Location: [place] [facility];  Service: Cardiovascular;;  lt. sfa and AT  . PERIPHERAL VASCULAR BALLOON ANGIOPLASTY Left 09/03/2018   Procedure: PERIPHERAL VASCULAR BALLOON ANGIOPLASTY;  Surgeon: Serafina Mitchell, MD;  Location: [place] [facility];  Service: Cardiovascular;  Laterality: Left;  TP TRUNK  . PERIPHERAL VASCULAR CATHETERIZATION N/A 12/30/2014   Procedure: Lower Extremity Angiography;  Surgeon: Wellington Hampshire, MD;  Location: [place] [facility];  Service: Cardiovascular;  Laterality: N/A;  . PERIPHERAL VASCULAR INTERVENTION Left 09/03/2018   Procedure:  PERIPHERAL VASCULAR INTERVENTION;  Surgeon: Serafina Mitchell, MD;  Location: [place] [facility];  Service: Cardiovascular;  Laterality: Left;  SFA STENT   . THYROID SURGERY     radioactive iodine   . TONSILLECTOMY       Current Outpatient Medications  Medication Sig Dispense Refill  . amLODipine (NORVASC) 10 MG tablet TAKE 1 TABLET BY MOUTH ONCE DAILY 90 tablet 0  . aspirin EC 325 MG tablet Take 325 mg by mouth at bedtime.     Marland Kitchen atorvastatin (LIPITOR) 20 MG tablet Take 1 tablet (20 mg total) by mouth daily. 90 tablet 0  . Biotin 5000 MCG CAPS Take 5,000 mcg by mouth daily.    . carvedilol (COREG) 25 MG tablet Take 1 tablet (25 mg total) by mouth 2 (two) times daily with a meal. *Please call and schedule an appointment with Dr Fletcher Anon* 60 tablet 0  . cholecalciferol (VITAMIN D) 1000 units tablet Take 2,000 Units by mouth daily.     . clopidogrel (PLAVIX) 75 MG tablet Take 1 tablet (75 mg total) by mouth daily. 30 tablet 11  . Coenzyme Q10 (CO Q 10) 100 MG CAPS Take by mouth.    . Insulin Glargine (BASAGLAR KWIKPEN [place]) Inject 70 Units into the skin every evening.    . insulin lispro (HUMALOG) 100 UNIT/ML injection Inject 10 units with breakfast and 16 units with lunch and dinner,    . lisinopril (PRINIVIL,ZESTRIL) 20 MG tablet Take 20 mg by mouth every evening.     . nitroGLYCERIN (NITROSTAT) 0.4 MG SL tablet PLACE 1 TABLET UNDER THE TONGUE EVERY 5 MINUTES AS NEEDED FOR CHEST PAIN 25 tablet 0  . pantoprazole (PROTONIX) 40 MG tablet TAKE 1 TABLET BY MOUTH ONCE DAILY AT  6AM 90 tablet 2  . Vitamin D, Ergocalciferol, (DRISDOL) 50000 units CAPS capsule Take 50,000 Units by mouth every Friday.   11  . Wound Dressings (MEDIHONEY WOUND/BURN DRESSING) GEL Apply 1 application topically daily.     No current facility-administered medications for this visit.     Allergies:   Sulfa antibiotics    Social History:  The patient  reports that she quit smoking about 58 years ago. She has never used  smokeless tobacco. She reports current alcohol use. She reports that she does not use drugs.   Family History:  The patient's family history includes Diabetes in her sister; Heart attack in her father and grandchild; Heart disease in her father and son; Sudden death in her grandchild.    ROS:  Please see the history of present illness.   Otherwise, review of systems are positive for foot issues due to PAD.   All other systems are reviewed and negative.    PHYSICAL EXAM: VS:  BP 124/66   Pulse 89   Ht 5\' 8"  (1.727 m)   Wt 173 lb 12.8 oz (78.8 kg)   SpO2 98%   BMI 26.43 kg/m  , BMI Body mass index is 26.43 kg/m. GEN: Well nourished, well developed, in  no acute distress  HEENT: normal  Neck: no JVD, carotid bruits, or masses Cardiac: RRR; no murmurs, rubs, or gallops,no edema  Respiratory:  clear to auscultation bilaterally, normal work of breathing GI: soft, nontender, nondistended, + BS MS: no deformity or atrophy ; right foot boot Skin: warm and dry, no rash Neuro:  Strength and sensation are intact Psych: euthymic mood, full affect   EKG:   The ekg ordered today demonstrates NSR, LVH with repol abnormality   Recent Labs: 09/03/2018: Hemoglobin 12.2 11/29/2018: BUN 18; Creatinine, Ser 0.61; Potassium 5.0; Sodium 137   Lipid Panel    Component Value Date/Time   CHOL 141 12/21/2014 0733   TRIG (H) 12/21/2014 0733    442.0 Triglyceride is over 400; calculations on Lipids are invalid.   HDL 29.10 (L) 12/21/2014 0733   CHOLHDL 5 12/21/2014 0733   VLDL 56.6 (H) 06/22/2014 0737   LDLCALC 24 10/20/2013 0739   LDLDIRECT 46.0 12/21/2014 0733     Other studies Reviewed: Additional studies/ records that were reviewed today with results demonstrating: lipids reviewed.  TG elevated.   ASSESSMENT AND PLAN:  1. CAD: No angina.  Continue aggressive secondary prevention. She prefers higher dose aspirin. 2. PAD: Osteomyelitis. S/p amputation of a toe. 3. Hyperlipidemia: LDL 117.  Was on atorvastatin.   Await repeat labs. Need to clarify if she is on a statin.  She changed pharmacies and does not seem to be on a statin.  Will represcribe. 4. DM: A1C 8.4.  Lowering sugar will help lower TG.   Current medicines are reviewed at length with the patient today.  The patient concerns regarding her medicines were addressed.  The following changes have been made:  No change  Labs/ tests ordered today include:  No orders of the defined types were placed in this encounter.   Recommend 150 minutes/week of aerobic exercise Low fat, low carb, high fiber diet recommended  Disposition:   FU in 1 year   Signed, Larae Grooms, MD  01/13/2019 1:49 PM    [facility] [address], [place], [place]  [address] Phone: [phone]; Fax: [phone]

## 2019-01-13 ENCOUNTER — Encounter: Payer: Self-pay | Admitting: Interventional Cardiology

## 2019-01-13 ENCOUNTER — Other Ambulatory Visit: Payer: Self-pay

## 2019-01-13 ENCOUNTER — Ambulatory Visit (INDEPENDENT_AMBULATORY_CARE_PROVIDER_SITE_OTHER): Payer: HMO | Admitting: Interventional Cardiology

## 2019-01-13 VITALS — BP 124/66 | HR 89 | Ht 68.0 in | Wt 173.8 lb

## 2019-01-13 DIAGNOSIS — E782 Mixed hyperlipidemia: Secondary | ICD-10-CM

## 2019-01-13 DIAGNOSIS — I7025 Atherosclerosis of native arteries of other extremities with ulceration: Secondary | ICD-10-CM | POA: Diagnosis not present

## 2019-01-13 DIAGNOSIS — I739 Peripheral vascular disease, unspecified: Secondary | ICD-10-CM

## 2019-01-13 DIAGNOSIS — I25119 Atherosclerotic heart disease of native coronary artery with unspecified angina pectoris: Secondary | ICD-10-CM

## 2019-01-13 DIAGNOSIS — Z794 Long term (current) use of insulin: Secondary | ICD-10-CM | POA: Diagnosis not present

## 2019-01-13 DIAGNOSIS — E1159 Type 2 diabetes mellitus with other circulatory complications: Secondary | ICD-10-CM

## 2019-01-13 DIAGNOSIS — I779 Disorder of arteries and arterioles, unspecified: Secondary | ICD-10-CM

## 2019-01-13 MED ORDER — ATORVASTATIN CALCIUM 20 MG PO TABS: 20.0000 mg | ORAL_TABLET | Freq: Every day | ORAL | 3 refills | Status: DC

## 2019-01-13 NOTE — Patient Instructions (Signed)
Medication Instructions:  Your physician recommends that you continue on your current medications as directed. Please refer to the Current Medication list given to you today.  If you need a refill on your cardiac medications before your next appointment, please call your pharmacy.   Lab work:  None ordered today  Testing/Procedures:  None ordered today  Follow-Up: At [facility], you and your health needs are our priority.  As part of our continuing mission to provide you with exceptional heart care, we have created designated Provider Care Teams.  These Care Teams include your primary Cardiologist (physician) and Advanced Practice Providers (APPs -  Physician Assistants and Nurse Practitioners) who all work together to provide you with the care you need, when you need it. You will need a follow up appointment in 12 months.  Please call our office 2 months in advance to schedule this appointment.  You may see Larae Grooms, MD or one of the following Advanced Practice Providers on your designated Care Team:   [place], PA-C Melina Copa, PA-C . Ermalinda Barrios, PA-C

## 2019-02-17 DIAGNOSIS — N182 Chronic kidney disease, stage 2 (mild): Secondary | ICD-10-CM | POA: Diagnosis not present

## 2019-02-17 DIAGNOSIS — F329 Major depressive disorder, single episode, unspecified: Secondary | ICD-10-CM | POA: Diagnosis not present

## 2019-02-17 DIAGNOSIS — E1129 Type 2 diabetes mellitus with other diabetic kidney complication: Secondary | ICD-10-CM | POA: Diagnosis not present

## 2019-02-17 DIAGNOSIS — I130 Hypertensive heart and chronic kidney disease with heart failure and stage 1 through stage 4 chronic kidney disease, or unspecified chronic kidney disease: Secondary | ICD-10-CM | POA: Diagnosis not present

## 2019-02-17 DIAGNOSIS — I739 Peripheral vascular disease, unspecified: Secondary | ICD-10-CM | POA: Diagnosis not present

## 2019-02-17 DIAGNOSIS — E1140 Type 2 diabetes mellitus with diabetic neuropathy, unspecified: Secondary | ICD-10-CM | POA: Diagnosis not present

## 2019-02-17 DIAGNOSIS — E785 Hyperlipidemia, unspecified: Secondary | ICD-10-CM | POA: Diagnosis not present

## 2019-02-17 DIAGNOSIS — Z9861 Coronary angioplasty status: Secondary | ICD-10-CM | POA: Diagnosis not present

## 2019-02-17 DIAGNOSIS — Z89429 Acquired absence of other toe(s), unspecified side: Secondary | ICD-10-CM | POA: Diagnosis not present

## 2019-02-17 DIAGNOSIS — I5032 Chronic diastolic (congestive) heart failure: Secondary | ICD-10-CM | POA: Diagnosis not present

## 2019-02-17 DIAGNOSIS — E0590 Thyrotoxicosis, unspecified without thyrotoxic crisis or storm: Secondary | ICD-10-CM | POA: Diagnosis not present

## 2019-02-17 DIAGNOSIS — I429 Cardiomyopathy, unspecified: Secondary | ICD-10-CM | POA: Diagnosis not present

## 2019-02-19 DIAGNOSIS — E1140 Type 2 diabetes mellitus with diabetic neuropathy, unspecified: Secondary | ICD-10-CM | POA: Diagnosis not present

## 2019-02-19 DIAGNOSIS — E7849 Other hyperlipidemia: Secondary | ICD-10-CM | POA: Diagnosis not present

## 2019-02-24 ENCOUNTER — Telehealth: Payer: Self-pay | Admitting: Interventional Cardiology

## 2019-02-24 ENCOUNTER — Other Ambulatory Visit: Payer: Self-pay | Admitting: *Deleted

## 2019-02-24 MED ORDER — AMLODIPINE BESYLATE 10 MG PO TABS: ORAL_TABLET | ORAL | 2 refills | Status: DC

## 2019-02-24 NOTE — Telephone Encounter (Signed)
New message    *STAT* If patient is at the pharmacy, call can be transferred to refill team.   1. Which medications need to be refilled? (please list name of each medication and dose if known) amLODipine (NORVASC) 10 MG tablet  2. Which pharmacy/location (including street and city if local pharmacy) is medication to be sent to?[facility] on [address], [place]  3. Do they need a 30 day or 90 day supply? [place]

## 2019-03-14 DIAGNOSIS — M7742 Metatarsalgia, left foot: Secondary | ICD-10-CM | POA: Diagnosis not present

## 2019-03-14 DIAGNOSIS — L97524 Non-pressure chronic ulcer of other part of left foot with necrosis of bone: Secondary | ICD-10-CM | POA: Diagnosis not present

## 2019-03-17 DIAGNOSIS — Z794 Long term (current) use of insulin: Secondary | ICD-10-CM | POA: Diagnosis not present

## 2019-03-17 DIAGNOSIS — E785 Hyperlipidemia, unspecified: Secondary | ICD-10-CM | POA: Diagnosis not present

## 2019-03-17 DIAGNOSIS — I739 Peripheral vascular disease, unspecified: Secondary | ICD-10-CM | POA: Diagnosis not present

## 2019-03-17 DIAGNOSIS — E049 Nontoxic goiter, unspecified: Secondary | ICD-10-CM | POA: Diagnosis not present

## 2019-03-17 DIAGNOSIS — E1159 Type 2 diabetes mellitus with other circulatory complications: Secondary | ICD-10-CM | POA: Diagnosis not present

## 2019-03-17 DIAGNOSIS — E11621 Type 2 diabetes mellitus with foot ulcer: Secondary | ICD-10-CM | POA: Diagnosis not present

## 2019-03-17 DIAGNOSIS — Z89429 Acquired absence of other toe(s), unspecified side: Secondary | ICD-10-CM | POA: Diagnosis not present

## 2019-03-17 DIAGNOSIS — N08 Glomerular disorders in diseases classified elsewhere: Secondary | ICD-10-CM | POA: Diagnosis not present

## 2019-03-17 DIAGNOSIS — Z9861 Coronary angioplasty status: Secondary | ICD-10-CM | POA: Diagnosis not present

## 2019-03-31 DIAGNOSIS — M7742 Metatarsalgia, left foot: Secondary | ICD-10-CM | POA: Diagnosis not present

## 2019-03-31 DIAGNOSIS — L97524 Non-pressure chronic ulcer of other part of left foot with necrosis of bone: Secondary | ICD-10-CM | POA: Diagnosis not present

## 2019-04-03 ENCOUNTER — Other Ambulatory Visit: Payer: Self-pay

## 2019-04-03 DIAGNOSIS — I779 Disorder of arteries and arterioles, unspecified: Secondary | ICD-10-CM

## 2019-04-03 NOTE — Patient Outreach (Signed)
  [facility]) Care Management Chronic Special Needs Program  04/03/2019  Name: MAELENE BANKEY DOB: 04-12-1932  MRN: [id number]  Ms. Francesca Riff is enrolled in a chronic special needs plan for Diabetes. Reviewed and updated care plan.  Subjective: Client reports last A1C 6.9. she reports ongoing chronic slow wound healing of foot, but states she attends provider follow up visits. 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 Client denies any issues or concerns at this time.   Goals Addressed            This Visit's Progress   . COMPLETED: Client understands the importance of follow-up with providers by attending scheduled visits       Voiced importance of attending follow up visits.    . COMPLETED: Client will use Assistive Devices as needed and verbalize understanding of device use       Denies any issues or questions.    . COMPLETED: Client will verbalize knowledge of self management of Hypertension as evidences by BP reading of 140/90 or less; or as defined by provider       Voiced takes medications as scheduled; monitors sodium intake; attends provider visits; reports blood pressure less than 140/90.    Marland Kitchen COMPLETED: HEMOGLOBIN A1C < 7       Last A1C 6.9 on 02/19/2019    . COMPLETED: Maintain timely refills of diabetic medication as prescribed within the year .       Denies issues with obtaining medication    . COMPLETED: Obtain annual  Lipid Profile, LDL-C       Done 02/19/2019    . COMPLETED: Obtain Annual Eye (retinal)  Exam        Done 07/24/2018    . Obtain Annual Foot Exam   On track   . Obtain annual screen for micro albuminuria (urine) , nephropathy (kidney problems)   On track   . Obtain Hemoglobin A1C at least 2 times per year   On track    A1C 6.9 on 02/19/2019    . Patient Stated: continued wound healing bottom of left foot.   On track   . COMPLETED: Visit Primary Care  Provider or Endocrinologist at least 2 times per year        Visit with primary care and/or endocrinologist at least two times/year      Covid-19 precautions reviewed. RNCM reinforced the availabilty of 24 hour nurse advice line and health care concierge. RNCM encouraged client to call RNCM as needed.  Plan: RNCM will send updated care plan to client. RNCM will send updated care plan to primary care provider. RNCM will follow up within the next 6 months.     Thea Silversmith, RN, MSN, [place] [phone]   .

## 2019-04-07 ENCOUNTER — Ambulatory Visit (INDEPENDENT_AMBULATORY_CARE_PROVIDER_SITE_OTHER)
Admission: RE | Admit: 2019-04-07 | Discharge: 2019-04-07 | Disposition: A | Discharge status: Home / Self Care | Payer: HMO | Source: Ambulatory Visit | Attending: Family | Admitting: Family

## 2019-04-07 ENCOUNTER — Ambulatory Visit (HOSPITAL_COMMUNITY)
Admission: RE | Admit: 2019-04-07 | Discharge: 2019-04-07 | Disposition: A | Discharge status: Home / Self Care | Payer: HMO | Source: Ambulatory Visit | Attending: Family | Admitting: Family

## 2019-04-07 ENCOUNTER — Other Ambulatory Visit: Payer: Self-pay

## 2019-04-07 DIAGNOSIS — I779 Disorder of arteries and arterioles, unspecified: Secondary | ICD-10-CM | POA: Insufficient documentation

## 2019-04-08 ENCOUNTER — Other Ambulatory Visit: Payer: Self-pay | Admitting: Interventional Cardiology

## 2019-04-08 MED ORDER — PANTOPRAZOLE SODIUM 40 MG PO TBEC: DELAYED_RELEASE_TABLET | ORAL | 2 refills | Status: DC

## 2019-04-08 NOTE — Telephone Encounter (Signed)
Pt's medication was sent to pt's pharmacy as requested. Confirmation received.  °

## 2019-04-09 ENCOUNTER — Ambulatory Visit (INDEPENDENT_AMBULATORY_CARE_PROVIDER_SITE_OTHER): Payer: HMO | Admitting: Family

## 2019-04-09 ENCOUNTER — Other Ambulatory Visit: Payer: Self-pay

## 2019-04-09 ENCOUNTER — Encounter: Payer: Self-pay | Admitting: Family

## 2019-04-09 DIAGNOSIS — Z89422 Acquired absence of other left toe(s): Secondary | ICD-10-CM

## 2019-04-09 DIAGNOSIS — E1151 Type 2 diabetes mellitus with diabetic peripheral angiopathy without gangrene: Secondary | ICD-10-CM

## 2019-04-09 DIAGNOSIS — I779 Disorder of arteries and arterioles, unspecified: Secondary | ICD-10-CM | POA: Diagnosis not present

## 2019-04-09 NOTE — Patient Instructions (Signed)
Peripheral Vascular Disease  Peripheral vascular disease (PVD) is a disease of the blood vessels that are not part of your heart and brain. A simple term for PVD is poor circulation. In most cases, PVD narrows the blood vessels that carry blood from your heart to the rest of your body. This can reduce the supply of blood to your arms, legs, and internal organs, like your stomach or kidneys. However, PVD most often affects a person's lower legs and feet. Without treatment, PVD tends to get worse. PVD can also lead to acute ischemic limb. This is when an arm or leg suddenly cannot get enough blood. This is a medical emergency. Follow these instructions at home: Lifestyle  Do not use any products that contain nicotine or tobacco, such as cigarettes and e-cigarettes. If you need help quitting, ask your doctor.  Lose weight if you are overweight. Or, stay at a healthy weight as told by your doctor.  Eat a diet that is low in fat and cholesterol. If you need help, ask your doctor.  Exercise regularly. Ask your doctor for activities that are right for you. General instructions  Take over-the-counter and prescription medicines only as told by your doctor.  Take good care of your feet: ? Wear comfortable shoes that fit well. ? Check your feet often for any cuts or sores.  Keep all follow-up visits as told by your doctor This is important. Contact a doctor if:  You have cramps in your legs when you walk.  You have leg pain when you are at rest.  You have coldness in a leg or foot.  Your skin changes.  You are unable to get or have an erection (erectile dysfunction).  You have cuts or sores on your feet that do not heal. Get help right away if:  Your arm or leg turns cold, numb, and blue.  Your arms or legs become red, warm, swollen, painful, or numb.  You have chest pain.  You have trouble breathing.  You suddenly have weakness in your face, arm, or leg.  You become very  confused or you cannot speak.  You suddenly have a very bad headache.  You suddenly cannot see. Summary  Peripheral vascular disease (PVD) is a disease of the blood vessels.  A simple term for PVD is poor circulation. Without treatment, PVD tends to get worse.  Treatment may include exercise, low fat and low cholesterol diet, and quitting smoking. This information is not intended to replace advice given to you by your health care provider. Make sure you discuss any questions you have with your health care provider. Document Released: 09/27/2009 Document Revised: 06/15/2017 Document Reviewed: 08/10/2016 Elsevier Patient Education  2020 [employer]  

## 2019-04-09 NOTE — Progress Notes (Signed)
Virtual Visit via Telephone Note   I connected with [name] on 04/09/2019 using the [employer] by telephone and verified that I was speaking with the correct person using two identifiers. Patient was located at her home and accompanied by herself. I am located at the [facility] office/clinic.   The limitations of evaluation and management by telemedicine and the availability of in person appointments have been previously discussed with the patient and are documented in the patients chart. The patient expressed understanding and consented to proceed.  PCP: Marton Redwood, MD  Chief Complaint: follow up PAD  History of Present Illness: [name] is a 85 y.o. female who is s/p stentplacement inleft superficial femoral arteryand balloon angioplastyofleft tibioperoneal trunk and peroneal artery on 09-03-18 by Dr. Trula Slade for non healing left leg ulcer.  On 04-16-18 Dr. Trula Slade performed aballoon angioplastyofleft superficial femoral artery, atherectomy with angioplastyofleft peroneal artery, and angioplastyofleft anterior tibial artery for left leg ulcer.   On 10-29-15 Dr. Sharol Given performedIrrigation and Debridement Left Foot with excision of skin soft tissue and tendon, and local tissue rearrangement for wound closure 1 x 4 cmforAbscess 2 and 3 Metatarsal Head Left Foot.  She is also s/p left 3rd toe ray amputation on 12-03-18 by Dr. Doran Durand for osteomyelitis of the third MTPJ left foot.  She denies any known hx of strokeor TIA.   She has a cardiac stent, sees Dr. Irish Lack, whom pt states referredherto [facility].    Diabetic:Yes, states her last A1C was about 6.?, controlled Tobacco [id number] used tobacco per pt  Pt meds include: Statin :Yes Betablocker:Yes ASA:Yes Other anticoagulants/antiplatelets:Plavix   Past Medical History:  Diagnosis Date  . Anxiety   . CAD (coronary artery disease)   . Cellulitis 10/2015   LEFT FOOT  . Complication of anesthesia   .  Coronary artery disease   . Diabetes mellitus without complication (HCC)    insulin dependent  . GERD (gastroesophageal reflux disease)   . Hypertension   . Hypothyroidism   . Neuromuscular disorder (HCC)    muscle cramps to lower extremities  . Other primary cardiomyopathies   . PONV (postoperative nausea and vomiting)   . Shortness of breath     Past Surgical History:  Procedure Laterality Date  . ABDOMINAL AORTOGRAM N/A 09/03/2018   Procedure: ABDOMINAL AORTOGRAM;  Surgeon: Serafina Mitchell, MD;  Location: [place] [facility];  Service: Cardiovascular;  Laterality: N/A;  . ABDOMINAL AORTOGRAM W/LOWER EXTREMITY N/A 04/16/2018   Procedure: ABDOMINAL AORTOGRAM W/LOWER EXTREMITY;  Surgeon: Serafina Mitchell, MD;  Location: [place] [facility];  Service: Cardiovascular;  Laterality: N/A;  unilateral  . ABDOMINAL HYSTERECTOMY    . AMPUTATION Left 12/03/2018   Procedure: Left 3rd ray amputation;  Surgeon: Wylene Simmer, MD;  Location: [place] [facility];  Service: Orthopedics;  Laterality: Left;  27mn  . CHOLECYSTECTOMY    . CORONARY ANGIOPLASTY WITH STENT PLACEMENT  08/09/2011   DES  to mid circumflex  . I&D EXTREMITY Left 10/29/2015   Procedure: Irrigation and Debridement Left Foot;  Surgeon: MNewt Minion MD;  [place] [facility]  Service: Orthopedics;  Laterality: Left;  . LEFT HEART CATHETERIZATION WITH CORONARY ANGIOGRAM N/A 08/08/2012   Procedure: LEFT HEART CATHETERIZATION WITH CORONARY ANGIOGRAM;  Surgeon: JJettie Booze MD;  [facility];  Service: Cardiovascular;  Laterality: N/A;  . PERIPHERAL VASCULAR ATHERECTOMY  04/16/2018   Procedure: PERIPHERAL VASCULAR ATHERECTOMY;  Surgeon: BSerafina Mitchell MD;  [facility];  Service: Cardiovascular;;  lt. Peroneal  . PERIPHERAL VASCULAR BALLOON ANGIOPLASTY  04/16/2018   Procedure: PERIPHERAL VASCULAR BALLOON ANGIOPLASTY;  Surgeon: Serafina Mitchell, MD;  Location: [place] [facility];  Service:  Cardiovascular;;  lt. sfa and AT  . PERIPHERAL VASCULAR BALLOON ANGIOPLASTY Left 09/03/2018   Procedure: PERIPHERAL VASCULAR BALLOON ANGIOPLASTY;  Surgeon: Serafina Mitchell, MD;  Location: [place] [facility];  Service: Cardiovascular;  Laterality: Left;  TP TRUNK  . PERIPHERAL VASCULAR CATHETERIZATION N/A 12/30/2014   Procedure: Lower Extremity Angiography;  Surgeon: Wellington Hampshire, MD;  Location: [place] [facility];  Service: Cardiovascular;  Laterality: N/A;  . PERIPHERAL VASCULAR INTERVENTION Left 09/03/2018   Procedure: PERIPHERAL VASCULAR INTERVENTION;  Surgeon: Serafina Mitchell, MD;  Location: [place] [facility];  Service: Cardiovascular;  Laterality: Left;  SFA STENT   . THYROID SURGERY     radioactive iodine   . TONSILLECTOMY      Current Meds  Medication Sig  . amLODipine (NORVASC) 10 MG tablet TAKE 1 TABLET BY MOUTH ONCE DAILY  . aspirin EC 325 MG tablet Take 325 mg by mouth at bedtime.   Marland Kitchen atorvastatin (LIPITOR) 20 MG tablet Take 1 tablet (20 mg total) by mouth daily.  . Biotin 5000 MCG CAPS Take 5,000 mcg by mouth daily.  . carvedilol (COREG) 25 MG tablet Take 1 tablet (25 mg total) by mouth 2 (two) times daily with a meal. *Please call and schedule an appointment with Dr Fletcher Anon*  . cholecalciferol (VITAMIN D) 1000 units tablet Take 2,000 Units by mouth daily.   . clopidogrel (PLAVIX) 75 MG tablet Take 1 tablet (75 mg total) by mouth daily.  . Coenzyme Q10 (CO Q 10) 100 MG CAPS Take by mouth.  . Insulin Glargine (BASAGLAR KWIKPEN [place]) Inject 70 Units into the skin every evening.  . insulin lispro (HUMALOG) 100 UNIT/ML injection Inject 10 units with breakfast and 16 units with lunch and dinner,  . lisinopril (PRINIVIL,ZESTRIL) 20 MG tablet Take 20 mg by mouth every evening.   . nitroGLYCERIN (NITROSTAT) 0.4 MG SL tablet PLACE 1 TABLET UNDER THE TONGUE EVERY 5 MINUTES AS NEEDED FOR CHEST PAIN  . pantoprazole (PROTONIX) 40 MG tablet TAKE 1 TABLET BY MOUTH ONCE DAILY AT  6AM  .  Vitamin D, Ergocalciferol, (DRISDOL) 50000 units CAPS capsule Take 50,000 Units by mouth every Friday.   . Wound Dressings (MEDIHONEY WOUND/BURN DRESSING) GEL Apply 1 application topically daily.    12 system ROS was negative unless otherwise noted in HPI   Observations/Objective:  DATA  Left LE Arterial Duplex (04-07-19): +-----------+---------+-----+---------------+----------+--------+ LEFT       PSV cm/s RatioStenosis       Waveform  Comments +-----------+---------+-----+---------------+----------+--------+ SFA Mid    123 / 266     50-74% stenosisbiphasic           +-----------+---------+-----+---------------+----------+--------+ SFA Distal 86                           biphasic           +-----------+---------+-----+---------------+----------+--------+ POP Mid    83                           biphasic           +-----------+---------+-----+---------------+----------+--------+ ATA Distal 123  monophasic         +-----------+---------+-----+---------------+----------+--------+ PTA Distal                                        NV       +-----------+---------+-----+---------------+----------+--------+ PERO Distal24                           monophasicdampened +-----------+---------+-----+---------------+----------+--------+    Left Stent(s): +---------------+--++--------++ Prox to Stent  90biphasic +---------------+--++--------++ Proximal Stent 86biphasic +---------------+--++--------++ Mid Stent      78biphasic +---------------+--++--------++ Distal Stent   84biphasic +---------------+--++--------++ Distal to Stent93biphasic +---------------+--++--------++ Summary: Left: Patent stent with no visualized stenosis. 50 - 74% stenosis in the mid SFA area.   ABI Findings (04-07-19): +---------+------------------+-----+----------+--------+ Right    Rt Pressure  (mmHg)IndexWaveform  Comment  +---------+------------------+-----+----------+--------+ Brachial 146                                       +---------+------------------+-----+----------+--------+ PTA      107               0.73 monophasic         +---------+------------------+-----+----------+--------+ DP       88                0.60 monophasic         +---------+------------------+-----+----------+--------+ [name]                0.57 Abnormal           +---------+------------------+-----+----------+--------+  +--------+------------------+-----+----------+-------+ Left    Lt Pressure (mmHg)IndexWaveform  Comment +--------+------------------+-----+----------+-------+ [name]                                      +--------+------------------+-----+----------+-------+ PTA     105               0.72 monophasic        +--------+------------------+-----+----------+-------+ DP      139               0.95 biphasic          +--------+------------------+-----+----------+-------+  +-------+-----------+-----------+------------+------------+ ABI/TBIToday's ABIToday's TBIPrevious ABIPrevious TBI +-------+-----------+-----------+------------+------------+ Right  0.73       0.57       0.73        0.23         +-------+-----------+-----------+------------+------------+ Left   0.95       -          1.05        0.45         +-------+-----------+-----------+------------+------------+  Summary: Right: Resting right ankle-brachial index indicates moderate right lower extremity arterial disease. The right toe-brachial index is abnormal.  Left: Resting left ankle-brachial index is within normal range. No evidence of significant left lower extremity arterial disease.    Assessment and Plan: [name] is a 85 y.o. female who is s/p stentplacement inleft superficial femoral arteryand balloon angioplastyofleft  tibioperoneal trunk and peroneal artery on 09-03-18 by Dr. Trula Slade for non healing left leg ulcer.  On 04-16-18 Dr. Trula Slade performed aballoon angioplastyofleft superficial femoral artery, atherectomy with angioplastyofleft peroneal artery, and angioplastyofleft anterior tibial artery for left leg ulcer.   On 10-29-15 Dr.  Duda performedIrrigation and Debridement Left Foot with excision of skin soft tissue and tendon, and local tissue rearrangement for wound closure 1 x 4 cmforAbscess 2 and 3 Metatarsal Head Left Foot.  She is also s/p left 3rd toe ray amputation on 12-03-18 by Dr. Doran Durand for osteomyelitis of the third MTPJ left foot. This site is well healed.  She still has the ulcer on the plantar surface at the base of her left 3rd toe, is being managed by Dr. Doran Durand.  Today's left LE arterial duplex shows a patent left stent with no evidence for restenosis.  ABI's remain stable: moderate disease in the right, normal in the left; monophasic waveforms in the right, mono and biphasic wave   Follow Up Instructions:   Follow up 3 months with ABI's and left LE arterial duplex.  Daily seated leg exercises as dicussed.  I advised her to notify us and Dr. Doran Durand if develops concerns re the circulation in her feet or legs.    I discussed the assessment and treatment plan with the patient. The patient was provided an opportunity to ask questions and all were answered. The patient agreed with the plan and demonstrated an understanding of the instructions.   The patient was advised to call back or seek an in-person evaluation if the symptoms worsen or if the condition fails to improve as anticipated.  I spent 12 minutes with the patient via telephone encounter.   Gabrielle Dare Jamiere Gulas [facility] [place] Office: [phone]  04/09/2019, 3:10 PM

## 2019-04-10 ENCOUNTER — Encounter: Payer: Self-pay | Admitting: Family

## 2019-04-16 DIAGNOSIS — N08 Glomerular disorders in diseases classified elsewhere: Secondary | ICD-10-CM | POA: Diagnosis not present

## 2019-04-16 DIAGNOSIS — Z794 Long term (current) use of insulin: Secondary | ICD-10-CM | POA: Diagnosis not present

## 2019-04-16 DIAGNOSIS — I130 Hypertensive heart and chronic kidney disease with heart failure and stage 1 through stage 4 chronic kidney disease, or unspecified chronic kidney disease: Secondary | ICD-10-CM | POA: Diagnosis not present

## 2019-04-16 DIAGNOSIS — E1140 Type 2 diabetes mellitus with diabetic neuropathy, unspecified: Secondary | ICD-10-CM | POA: Diagnosis not present

## 2019-04-21 DIAGNOSIS — M7742 Metatarsalgia, left foot: Secondary | ICD-10-CM | POA: Diagnosis not present

## 2019-04-21 DIAGNOSIS — L97524 Non-pressure chronic ulcer of other part of left foot with necrosis of bone: Secondary | ICD-10-CM | POA: Diagnosis not present

## 2019-04-22 ENCOUNTER — Other Ambulatory Visit: Payer: Self-pay | Admitting: Pharmacist

## 2019-04-22 NOTE — Patient Outreach (Addendum)
[facility]) Care Management  04/22/2019  [name] 1931/12/04 [id number]   Patient was called for CSNP quarterly review. HIPAA identifiers were obtained.   Patient is an 85 year old female with multiple medical conditions including but not limited to:  CHF, CAD, type 2 diabetes, edema, hypertension, hyperlipidemia, PAD, and GERD. She reported feeling fine and not having any medication issues. Patient said she had just received a shipment of Basaglar and Humalog from [employer]. Patient shared she will be moving to a new apartment within the next two months.  Medications were reviewed:  Medications Reviewed Today    Reviewed by Elayne Guerin, [facility] (Pharmacist) on 04/22/19 at [place] List Status: <None>  Medication Order Taking? Sig Documenting Provider Last Dose Status Informant  amLODipine (NORVASC) 10 MG tablet [id number] Yes TAKE 1 TABLET BY MOUTH ONCE DAILY [name], MD Taking Active   aspirin EC 325 MG tablet [id number] Yes Take 325 mg by mouth at bedtime.  [provider] Taking Active Self  atorvastatin (LIPITOR) 20 MG tablet [id number] Yes Take 1 tablet (20 mg total) by mouth daily. [name], MD Taking Active   Biotin 5000 MCG CAPS [id number] Yes Take 5,000 mcg by mouth daily. [provider] Taking Active Self  carvedilol (COREG) 25 MG tablet [id number] Yes Take 1 tablet (25 mg total) by mouth 2 (two) times daily with a meal. *Please call and schedule an appointment with Dr Fletcher AnonFletcher Anon, Mertie Clause, MD Taking Active Self  cholecalciferol (VITAMIN D) 1000 units tablet [id number] Yes Take 2,000 Units by mouth daily.  [provider] Taking Active Self  clopidogrel (PLAVIX) 75 MG tablet [id number] Yes Take 1 tablet (75 mg total) by mouth daily. Serafina Mitchell, MD Taking Active Self  Coenzyme Q10 (CO Q 10) 100 MG CAPS [id number] Yes Take 1 capsule by mouth daily.  [provider] Taking Active   ergocalciferol  (VITAMIN D2) 1.25 MG (50000 UT) capsule [id number] Yes Take 50,000 Units by mouth once a week. [provider] Taking Active   FREESTYLE LITE test strip [id number] Yes USE TO CHECK BS TWICE DAILY  AND PRN [provider] Taking Active   Insulin Glargine [facility] [place]) [id number] Yes Inject 70 Units into the skin every evening. [provider] Taking Active   insulin lispro (HUMALOG) 100 UNIT/ML injection [id number] Yes Inject 10 units with breakfast and 16 units with lunch and dinner, [provider] Taking Active   lisinopril (PRINIVIL,ZESTRIL) 20 MG tablet [id number] Yes Take 20 mg by mouth every evening.  [provider] Taking Active Self           Med Note Eric Form Dec 30, 2015  8:48 AM)    nitroGLYCERIN (NITROSTAT) 0.4 MG SL tablet [id number] Yes PLACE 1 TABLET UNDER THE TONGUE EVERY 5 MINUTES AS NEEDED FOR CHEST PAIN [name], MD Taking Active   pantoprazole (PROTONIX) 40 MG tablet [id number] Yes TAKE 1 TABLET BY MOUTH ONCE DAILY AT  6AM [name], MD Taking Active   Vitamin D, Ergocalciferol, (DRISDOL) 50000 units CAPS capsule [id number] Yes Take 50,000 Units by mouth every Friday.  [provider] Taking Active Self        Discontinued 04/22/19 0932 (Completed Course)           HgA1c 6.9% (self reported) On statin (Atorvastatin last filled 04/11/2019 for 90 day supply)  Plan: Call patient back in 3 months.  Elayne Guerin, PharmD, [place] Clinical Pharmacist [phone]

## 2019-05-04 ENCOUNTER — Other Ambulatory Visit: Payer: Self-pay | Admitting: Surgery

## 2019-05-08 DIAGNOSIS — M7742 Metatarsalgia, left foot: Secondary | ICD-10-CM | POA: Diagnosis not present

## 2019-05-08 DIAGNOSIS — L97524 Non-pressure chronic ulcer of other part of left foot with necrosis of bone: Secondary | ICD-10-CM | POA: Diagnosis not present

## 2019-05-23 DIAGNOSIS — M7742 Metatarsalgia, left foot: Secondary | ICD-10-CM | POA: Diagnosis not present

## 2019-05-23 DIAGNOSIS — L97524 Non-pressure chronic ulcer of other part of left foot with necrosis of bone: Secondary | ICD-10-CM | POA: Diagnosis not present

## 2019-06-04 DIAGNOSIS — E7849 Other hyperlipidemia: Secondary | ICD-10-CM | POA: Diagnosis not present

## 2019-06-04 DIAGNOSIS — E1159 Type 2 diabetes mellitus with other circulatory complications: Secondary | ICD-10-CM | POA: Diagnosis not present

## 2019-06-04 DIAGNOSIS — M859 Disorder of bone density and structure, unspecified: Secondary | ICD-10-CM | POA: Diagnosis not present

## 2019-06-09 DIAGNOSIS — L97524 Non-pressure chronic ulcer of other part of left foot with necrosis of bone: Secondary | ICD-10-CM | POA: Diagnosis not present

## 2019-06-09 DIAGNOSIS — M7742 Metatarsalgia, left foot: Secondary | ICD-10-CM | POA: Diagnosis not present

## 2019-06-11 DIAGNOSIS — E11621 Type 2 diabetes mellitus with foot ulcer: Secondary | ICD-10-CM | POA: Diagnosis not present

## 2019-06-11 DIAGNOSIS — I5032 Chronic diastolic (congestive) heart failure: Secondary | ICD-10-CM | POA: Diagnosis not present

## 2019-06-11 DIAGNOSIS — N182 Chronic kidney disease, stage 2 (mild): Secondary | ICD-10-CM | POA: Diagnosis not present

## 2019-06-11 DIAGNOSIS — Z0000 Encounter for general adult medical examination without abnormal findings: Secondary | ICD-10-CM | POA: Diagnosis not present

## 2019-06-11 DIAGNOSIS — E049 Nontoxic goiter, unspecified: Secondary | ICD-10-CM | POA: Diagnosis not present

## 2019-06-11 DIAGNOSIS — Z794 Long term (current) use of insulin: Secondary | ICD-10-CM | POA: Diagnosis not present

## 2019-06-11 DIAGNOSIS — E1129 Type 2 diabetes mellitus with other diabetic kidney complication: Secondary | ICD-10-CM | POA: Diagnosis not present

## 2019-06-11 DIAGNOSIS — E1159 Type 2 diabetes mellitus with other circulatory complications: Secondary | ICD-10-CM | POA: Diagnosis not present

## 2019-06-11 DIAGNOSIS — E1140 Type 2 diabetes mellitus with diabetic neuropathy, unspecified: Secondary | ICD-10-CM | POA: Diagnosis not present

## 2019-06-11 DIAGNOSIS — E0590 Thyrotoxicosis, unspecified without thyrotoxic crisis or storm: Secondary | ICD-10-CM | POA: Diagnosis not present

## 2019-06-11 DIAGNOSIS — I130 Hypertensive heart and chronic kidney disease with heart failure and stage 1 through stage 4 chronic kidney disease, or unspecified chronic kidney disease: Secondary | ICD-10-CM | POA: Diagnosis not present

## 2019-06-11 DIAGNOSIS — Z1331 Encounter for screening for depression: Secondary | ICD-10-CM | POA: Diagnosis not present

## 2019-06-11 DIAGNOSIS — Z89429 Acquired absence of other toe(s), unspecified side: Secondary | ICD-10-CM | POA: Diagnosis not present

## 2019-06-20 ENCOUNTER — Other Ambulatory Visit: Payer: Self-pay

## 2019-06-20 DIAGNOSIS — I779 Disorder of arteries and arterioles, unspecified: Secondary | ICD-10-CM

## 2019-06-23 ENCOUNTER — Ambulatory Visit (HOSPITAL_COMMUNITY)
Admission: RE | Admit: 2019-06-23 | Discharge: 2019-06-23 | Disposition: A | Discharge status: Home / Self Care | Payer: HMO | Source: Ambulatory Visit | Attending: Family | Admitting: Family

## 2019-06-23 ENCOUNTER — Other Ambulatory Visit: Payer: Self-pay

## 2019-06-23 ENCOUNTER — Ambulatory Visit: Payer: HMO | Admitting: Surgery

## 2019-06-23 ENCOUNTER — Ambulatory Visit (INDEPENDENT_AMBULATORY_CARE_PROVIDER_SITE_OTHER)
Admission: RE | Admit: 2019-06-23 | Discharge: 2019-06-23 | Disposition: A | Discharge status: Home / Self Care | Payer: HMO | Source: Ambulatory Visit | Attending: Family | Admitting: Family

## 2019-06-23 ENCOUNTER — Ambulatory Visit (INDEPENDENT_AMBULATORY_CARE_PROVIDER_SITE_OTHER): Payer: HMO | Admitting: Surgery

## 2019-06-23 ENCOUNTER — Encounter: Payer: Self-pay | Admitting: Surgery

## 2019-06-23 VITALS — BP 142/62 | HR 75 | Temp 97.7°F | Resp 20 | Ht 68.0 in | Wt 181.6 lb

## 2019-06-23 DIAGNOSIS — I779 Disorder of arteries and arterioles, unspecified: Secondary | ICD-10-CM | POA: Insufficient documentation

## 2019-06-23 DIAGNOSIS — I7025 Atherosclerosis of native arteries of other extremities with ulceration: Secondary | ICD-10-CM | POA: Diagnosis not present

## 2019-06-23 NOTE — Progress Notes (Signed)
[facility] [place]  Patient name: [name] MRN: [id number] DOB: May 28, 1932 Sex: female      Virtual Visit via Telephone Note   This visit type was conducted due to national recommendations for restrictions regarding the COVID-19 Pandemic (e.g. social distancing) in an effort to limit this patient's exposure and mitigate transmission in our community.  Due to her co-morbid illnesses, this patient is at least at moderate risk for complications without adequate follow up.  This format is felt to be most appropriate for this patient at this time.  The patient did not have access to video technology/had technical difficulties with video requiring transitioning to audio format only (telephone).  All issues noted in this document were discussed and addressed.  No physical exam could be performed with this format.   Patient Location: Home Provider Location: Office   REASON FOR VISIT:    Follow up  HISOTRY OF PRESENT ILLNESS:    [name] is a 85 y.o. female who returns today for follow-up.  She still has a essentially healed wound on her left foot which is being followed by Dr. Doran Durand.  On 04/16/2018 she underwent angiography.  A 80% origin stenosis of the peroneal artery was successfully treated with atherectomy and angioplasty.  Tandem 60-70% lesions within the superficial femoral artery on the left were treated with drug-eluting balloon angioplasty.  I also perform recanalization of an occluded left anterior tibial artery and subsequent balloon angioplasty.   Patient suffers from coronary artery disease she is undergone PCI in the past. The patient is a diabetic. She is on statin for hypercholesterolemia and takes an ACE inhibitor for hypertension.  PAST MEDICAL HISTORY:   Past Medical History:  Diagnosis Date  . Anxiety   . CAD (coronary artery disease)   . Cellulitis 10/2015   LEFT FOOT  . Complication of anesthesia   . Coronary artery  disease   . Diabetes mellitus without complication (HCC)    insulin dependent  . GERD (gastroesophageal reflux disease)   . Hypertension   . Hypothyroidism   . Neuromuscular disorder (HCC)    muscle cramps to lower extremities  . Other primary cardiomyopathies   . PONV (postoperative nausea and vomiting)   . Shortness of breath      FAMILY HISTORY:   Family History  Problem Relation Age of Onset  . Heart disease Father   . Heart attack Father   . Diabetes Sister   . Heart disease Son        before age 56  . Heart attack 41        85yrold  . Sudden death Grandchild   . Hypertension Neg Hx     SOCIAL HISTORY:   Social History   Tobacco Use  . Smoking status: Former Smoker    Quit date: 11/28/1960    Years since quitting: 58.6  . Smokeless tobacco: Never Used  Substance Use Topics  . Alcohol use: Yes    Alcohol/week: 0.0 standard drinks    Comment: rare     ALLERGIES:   Allergies  Allergen Reactions  . Sulfa Antibiotics Nausea And Vomiting    Severe vomiting.      CURRENT MEDICATIONS:   Current Outpatient Medications  Medication Sig Dispense Refill  . amLODipine (NORVASC) 10 MG tablet TAKE 1 TABLET BY MOUTH ONCE DAILY 90 tablet 2  . aspirin EC 325 MG tablet Take  325 mg by mouth at bedtime.     Marland Kitchen atorvastatin (LIPITOR) 20 MG tablet Take 1 tablet (20 mg total) by mouth daily. 90 tablet 3  . Biotin 5000 MCG CAPS Take 5,000 mcg by mouth daily.    . carvedilol (COREG) 25 MG tablet Take 1 tablet (25 mg total) by mouth 2 (two) times daily with a meal. *Please call and schedule an appointment with Dr Fletcher Anon* 60 tablet 0  . cholecalciferol (VITAMIN D) 1000 units tablet Take 2,000 Units by mouth daily.     . clopidogrel (PLAVIX) 75 MG tablet TAKE 1 TABLET BY MOUTH ONCE DAILY 30 tablet 11  . Coenzyme Q10 (CO Q 10) 100 MG CAPS Take 1 capsule by mouth daily.     . ergocalciferol (VITAMIN D2) 1.25 MG (50000 UT) capsule Take 50,000 Units by mouth once a week.      Marland Kitchen FREESTYLE LITE test strip USE TO CHECK BS TWICE DAILY  AND PRN    . Insulin Glargine (BASAGLAR KWIKPEN [place]) Inject 70 Units into the skin every evening.    . insulin lispro (HUMALOG) 100 UNIT/ML injection Inject 10 units with breakfast and 16 units with lunch and dinner,    . lisinopril (PRINIVIL,ZESTRIL) 20 MG tablet Take 20 mg by mouth every evening.     . nitroGLYCERIN (NITROSTAT) 0.4 MG SL tablet PLACE 1 TABLET UNDER THE TONGUE EVERY 5 MINUTES AS NEEDED FOR CHEST PAIN 25 tablet 0  . pantoprazole (PROTONIX) 40 MG tablet TAKE 1 TABLET BY MOUTH ONCE DAILY AT  6AM 90 tablet 2  . PARoxetine (PAXIL) 20 MG tablet paroxetine 20 mg tablet  TK 1 T PO QD    . Vitamin D, Ergocalciferol, (DRISDOL) 50000 units CAPS capsule Take 50,000 Units by mouth every Friday.   11   No current facility-administered medications for this visit.     REVIEW OF SYSTEMS:   '[X]'$  denotes positive finding, '[ ]'$  denotes negative finding Cardiac  Comments:  Chest pain or chest pressure:    Shortness of breath upon exertion:    Short of breath when lying flat:    Irregular heart rhythm:        Vascular    Pain in calf, thigh, or hip brought on by ambulation:    Pain in feet at night that wakes you up from your sleep:     Blood clot in your veins:    Leg swelling:         Pulmonary    Oxygen at home:    Productive cough:     Wheezing:         Neurologic    Sudden weakness in arms or legs:     Sudden numbness in arms or legs:     Sudden onset of difficulty speaking or slurred speech:    Temporary loss of vision in one eye:     Problems with dizziness:         Gastrointestinal    Blood in stool:     Vomited blood:         Genitourinary    Burning when urinating:     Blood in urine:        Psychiatric    Major depression:         Hematologic    Bleeding problems:    Problems with blood clotting too easily:        Skin    Rashes or ulcers: x       Constitutional  Fever or chills:       PHYSICAL EXAM:   Vitals:   06/23/19 1236  BP: (!) 142/62  Pulse: 75  Resp: 20  Temp: 97.7 F (36.5 C)  SpO2: 97%  Weight: 181 lb 9.6 oz (82.4 kg)  Height: '5\' 8"'$  (1.727 m)    GENERAL: The patient is  in no acute distress. The vital signs are documented above. PULMONARY: Non-labored breathing PSYCHIATRIC: The patient has a normal affect.  STUDIES:   I have reviewed the following:   ABI/TBIToday's ABIToday's TBIPrevious ABIPrevious TBI +-------+-----------+-----------+------------+------------+ Right  0.57       0.24       0.73        0.57         +-------+-----------+-----------+------------+------------+ Left   0.99       0.57       0.95                     +-------+-----------+-----------+------------+------------+  Left: Patent stent with no visualized stenosis. Increased velocity in the mid to distal SFA suggestive of 50 - 74% stenosis. Probabl tibial artery occlusive disease.  Peak velocity is 312 cm/s in the mid superficial femoral artery    MEDICAL ISSUES:   The patient had to leave to go home to take care of her husband who has dementia therefore we spoke via telephone.  She states that her wound has essentially healed.  She is going back to see Dr. Doran Durand in several weeks.  We discussed her ultrasound findings.  Her ABI remained stable.  She is developing recurrent stenosis in her superficial femoral artery.  Velocity profile has just become greater than 300.  I recommended repeating this in 3 months and making decisions based off those results    [name], IV, MD, FACS [facility] [phone] Pager [phone]

## 2019-07-07 ENCOUNTER — Ambulatory Visit: Payer: Self-pay | Admitting: Pharmacist

## 2019-07-07 ENCOUNTER — Other Ambulatory Visit: Payer: Self-pay | Admitting: Pharmacist

## 2019-07-07 NOTE — Patient Outreach (Addendum)
[facility]) Care Management  07/07/2019  [name] March 26, 1932 [id number]   Patient was called regarding CSNP medication review. Unfortunately, she did not answer her phone. HIPAA compliant message was left on her voicemail.    Plan: Call patient back in 3-4 months.  [name], PharmD, BCACP [facility] Clinical Pharmacist [phone]  Addendum:  Patient called me back. HIPAA identifiers were obtained.  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  Patient said she was not having any issues with her medications and reported feeling well.  Her medications were reviewed via telephone:  Medications Reviewed Today    Reviewed by Jeanette Mitchell, MD (Physician) on 06/23/19 at 1309  Med List Status: <None>  Medication Order Taking? Sig Documenting Provider Last Dose Status Informant  amLODipine (NORVASC) 10 MG tablet [id number] Yes TAKE 1 TABLET BY MOUTH ONCE DAILY Jeanette Booze, MD Taking Active   aspirin EC 325 MG tablet [id number] Yes Take 325 mg by mouth at bedtime.  [provider] Taking Active Self  atorvastatin (LIPITOR) 20 MG tablet [id number] Yes Take 1 tablet (20 mg total) by mouth daily. Jeanette Booze, MD Taking Active   Biotin 5000 MCG CAPS [id number] Yes Take 5,000 mcg by mouth daily. [provider] Taking Active Self  carvedilol (COREG) 25 MG tablet [id number] Yes Take 1 tablet (25 mg total) by mouth 2 (two) times daily with a meal. *Please call and schedule an appointment with Dr Fletcher AnonFletcher Anon, Mertie Clause, MD Taking Active Self  cholecalciferol (VITAMIN D) 1000 units tablet [id number] Yes Take 2,000 Units by mouth daily.  [provider] Taking Active Self  clopidogrel (PLAVIX) 75 MG tablet [id number] Yes TAKE 1 TABLET BY MOUTH ONCE DAILY Jeanette Mitchell, MD  Taking Active   Coenzyme Q10 (CO Q 10) 100 MG CAPS [id number] Yes Take 1 capsule by mouth daily.  [provider] Taking Active   ergocalciferol (VITAMIN D2) 1.25 MG (50000 UT) capsule [id number] Yes Take 50,000 Units by mouth once a week. [provider] Taking Active   FREESTYLE LITE test strip [id number] Yes USE TO CHECK BS TWICE DAILY  AND PRN [provider] Taking Active   Insulin Glargine [facility]) [id number] Yes Inject 70 Units into the skin every evening. [provider] Taking Active   insulin lispro (HUMALOG) 100 UNIT/ML injection [id number] Yes Inject 10 units with breakfast and 16 units with lunch and dinner, [provider] Taking Active   lisinopril (PRINIVIL,ZESTRIL) 20 MG tablet [id number] Yes Take 20 mg by mouth every evening.  [provider] Taking Active Self           Med Note [name] Dec 30, 2015  8:48 AM)    nitroGLYCERIN (NITROSTAT) 0.4 MG SL tablet [id number] Yes PLACE 1 TABLET UNDER THE TONGUE EVERY 5 MINUTES AS NEEDED FOR CHEST PAIN Jeanette Booze, MD Taking Active   pantoprazole (PROTONIX) 40 MG tablet [id number] Yes TAKE 1 TABLET BY MOUTH ONCE DAILY AT  6AM Jeanette Booze, MD Taking Active   PARoxetine (PAXIL) 20 MG tablet [id number] Yes paroxetine 20 mg tablet  TK 1 T PO QD [provider] Taking Active   Vitamin D, Ergocalciferol, (DRISDOL) 50000 units CAPS capsule [id number] Yes Take 50,000 Units by mouth every Friday.  [provider] Taking  Active Self          Medication Review Findings:  HgA1c-7.4% On statin-atorvastatin  Medication Assistance Findings: Patient received Basaglar and Humalog through [employer] Patient Assistance Program last year.  She would like to reapply. Patient communicated understanding about the necessary financial documentation that needs to accompany her prescription.  Plan: I will route patient assistance letter to [place] technician who will coordinate patient assistance program application process for medications listed above.  [facility] pharmacy technician will assist with obtaining all required documents from both patient and provider(s) and submit application(s) once completed.    [name], PharmD, [place] Clinical Pharmacist [phone]

## 2019-07-07 NOTE — Addendum Note (Signed)
Addended by: Elayne Guerin on: 07/07/2019 01:58 PM   Modules accepted: Orders

## 2019-07-09 DIAGNOSIS — M7742 Metatarsalgia, left foot: Secondary | ICD-10-CM | POA: Diagnosis not present

## 2019-07-09 DIAGNOSIS — L97524 Non-pressure chronic ulcer of other part of left foot with necrosis of bone: Secondary | ICD-10-CM | POA: Diagnosis not present

## 2019-07-14 ENCOUNTER — Other Ambulatory Visit: Payer: Self-pay | Admitting: *Deleted

## 2019-07-14 DIAGNOSIS — I739 Peripheral vascular disease, unspecified: Secondary | ICD-10-CM

## 2019-07-16 ENCOUNTER — Other Ambulatory Visit: Payer: Self-pay | Admitting: Pharmacy Technician

## 2019-07-16 NOTE — Patient Outreach (Signed)
[facility]) Care Management  07/16/2019  [name] 1932/05/19 [id number]                                      Medication Assistance Referral  Referral From: [place]  Medication/Company: [name] and [name] Patient application portion:  Education officer, museum portion: Faxed  to Dr Marton Redwood Provider address/fax verified via: Office website     Follow up:  Will follow up with patient in 5-10 business days to confirm application(s) have been received.  Keats Kingry P. Denvil Canning, [place] Management [phone]

## 2019-07-23 ENCOUNTER — Other Ambulatory Visit: Payer: Self-pay | Admitting: Pharmacy Technician

## 2019-07-23 NOTE — Patient Outreach (Signed)
[facility]) Care Management  07/23/2019  NYHLA SALTER December 21, 1931 [id number]  Incoming call received from patient in regards to [place] application for Basaglar and Humalog.  Spoke to patient, HIPAA identifiers verified.  Patient informed she has not received the application that was mailed on 07/16/2019. Informed patient that the USPS was having delays but that I could place another application in the mail when I return to the office. Patient was agreeable to this plan.  Will followup with patient in 5-10 business days to inquire if she has received the 2nd application.  Jacon Whetzel P. Kamoni Depree, [place] Management [phone]

## 2019-07-29 ENCOUNTER — Other Ambulatory Visit: Payer: Self-pay | Admitting: Pharmacy Technician

## 2019-07-29 NOTE — Patient Outreach (Signed)
[facility]) Care Management  07/29/2019  [name] 1932/06/25 [id number]    Unsuccessful outreach call placed to patient in regards to a voicemail she left for me concerning her Leisure centre manager for [employer] and Humalog.  Unfortunately patient did not answer the phone, HIPAA compliant voicemail was left.  Was calling patient back in response to a voicemail she left me informing she has not received the applications that were mailed on 07/16/2019 and has yet to receive the application mailed on [id number].  Will await a return call. Otherwise, will follow up as previously scheduled.  Tanara Turvey P. Mayjor Ager, [place] Management [phone]

## 2019-07-31 DIAGNOSIS — H524 Presbyopia: Secondary | ICD-10-CM | POA: Diagnosis not present

## 2019-07-31 DIAGNOSIS — E119 Type 2 diabetes mellitus without complications: Secondary | ICD-10-CM | POA: Diagnosis not present

## 2019-07-31 DIAGNOSIS — Z794 Long term (current) use of insulin: Secondary | ICD-10-CM | POA: Diagnosis not present

## 2019-07-31 DIAGNOSIS — Z961 Presence of intraocular lens: Secondary | ICD-10-CM | POA: Diagnosis not present

## 2019-07-31 DIAGNOSIS — H5203 Hypermetropia, bilateral: Secondary | ICD-10-CM | POA: Diagnosis not present

## 2019-07-31 DIAGNOSIS — H52203 Unspecified astigmatism, bilateral: Secondary | ICD-10-CM | POA: Diagnosis not present

## 2019-08-01 ENCOUNTER — Other Ambulatory Visit: Payer: Self-pay | Admitting: Pharmacy Technician

## 2019-08-01 NOTE — Patient Outreach (Signed)
[facility]) Care Management  08/01/2019  LONETTE SAVEL 07-Nov-1931 [id number]    Successful call placed to patient regarding patient assistance application(s) for Basaglar and Humalog with [name] , HIPAA identifiers verified.   Spoke to patient who informed she has received the applications and is waiting on her son to make some copies for her. She informed she would have the application in the mail on Sunday for Monday pick up.  Follow up:  Will route note to [place] for case closure if document(s) have not been received in the next 15 business days.  Kadence Mimbs P. Camyra Vaeth, [place] Management [phone]

## 2019-08-11 ENCOUNTER — Other Ambulatory Visit: Payer: Self-pay | Admitting: Pharmacy Technician

## 2019-08-11 NOTE — Patient Outreach (Signed)
[facility]) Care Management  08/11/2019  MISCHELL LANGER 1932/05/27 [id number]  Incoming call received from patient in regards to [facility] application for Basaglar and Humalog.  Spoke to patient, HIPAA identifiers verified.  Patient was calling to inquire if I have received her return application in the mail and to inform me that she received a 2nd application from me in the mail.  Informed patient to hold on to the 2nd application that she requested I mailed to her because I have not received her application back yet. Informed patient that the USPS is having delays. Informed patient I would outreach her in a few days if I have not received it.  Will follow up with patient in 2-3 days to up date her on the application status.  Taylon Louison P. Kiera Hussey, [place] Management [phone]

## 2019-08-14 ENCOUNTER — Other Ambulatory Visit: Payer: Self-pay | Admitting: Pharmacy Technician

## 2019-08-14 NOTE — Patient Outreach (Signed)
[facility]) Care Management  08/14/2019  MAANYA OVALLE 1931-08-31 [id number]   Successful outreach call placed to patient in regards to [place] application for Basaglar and Humalog.  Spoke to patient, HIPAA identifiers verified.  Informed patient I received her application today. Informed her that I was waiting on the provider to send back in his part. He was faxed in December and re faxed today. Patient informed she would give him a call.  Once provider's portion is received will submit to [name] for a determination.  Keonta Alsip P. Verna Desrocher, [place] Management [phone]

## 2019-08-19 ENCOUNTER — Other Ambulatory Visit: Payer: Self-pay | Admitting: Pharmacy Technician

## 2019-08-19 NOTE — Patient Outreach (Signed)
[facility]) Care Management  08/19/2019  [name] 1931-12-12 [id number]    ADDENDUM  Received both patient and provider portion(s) of patient assistance application(s) for Basaglar and Humalog. Faxed completed application and required documents into [name].  Will follow up with company(ies) in 7-10 business days to check status of application(s).  Merlene Dante P. Sherby Moncayo, [place] Management [phone]

## 2019-08-19 NOTE — Patient Outreach (Signed)
[facility]) Care Management  08/19/2019  JULIANAH ERCOLANI 08/04/1931 [id number]    Care coordination call placed to Dr. Raul Del office in reference to the provider portion of [name] application for Humalog and [name] that was originally faxed to the office on 07/16/2019. It was faxed again on 08/14/2019. It was faxed a 3rd time on 08/15/2019 but to a different fax number that was given.  Left voicemail message for Lattie Haw inquiring about the status of the provider's portion of the application.  Once the provider's portion is received then will fax to [place] with patient's portion.  Loredana Medellin P. Joannah Gitlin, [place] Management [phone]

## 2019-08-20 DIAGNOSIS — Z794 Long term (current) use of insulin: Secondary | ICD-10-CM | POA: Diagnosis not present

## 2019-08-20 DIAGNOSIS — N182 Chronic kidney disease, stage 2 (mild): Secondary | ICD-10-CM | POA: Diagnosis not present

## 2019-08-20 DIAGNOSIS — I130 Hypertensive heart and chronic kidney disease with heart failure and stage 1 through stage 4 chronic kidney disease, or unspecified chronic kidney disease: Secondary | ICD-10-CM | POA: Diagnosis not present

## 2019-08-20 DIAGNOSIS — E1140 Type 2 diabetes mellitus with diabetic neuropathy, unspecified: Secondary | ICD-10-CM | POA: Diagnosis not present

## 2019-08-20 DIAGNOSIS — F3342 Major depressive disorder, recurrent, in full remission: Secondary | ICD-10-CM | POA: Diagnosis not present

## 2019-08-26 ENCOUNTER — Other Ambulatory Visit: Payer: Self-pay | Admitting: Pharmacy Technician

## 2019-08-26 NOTE — Patient Outreach (Signed)
[facility]) Care Management  08/26/2019  KEN OTOOLE [birth year] [id number]    ADDENDUM  Care coordination call placed to [place] in regards to patient's application for Humalog and Basaglar.  Spoke to [place] who informed the application was processing but that it would be beneficial to re fax the application as some parts were hard to read. She informed to re fax it to [phone].  Will follow up with [name] in 7-10 business days.  Chananya Canizalez P. Jamicia Haaland, [place] Management [phone]

## 2019-08-26 NOTE — Patient Outreach (Signed)
[facility]) Care Management  08/26/2019  [name] 1932/01/08 [id number]    Return call placed to patient regarding patient assistance application(s) for Humalog and Basalgar with [employer] , HIPAA identifiers verified.   Patient informed she received 2 letters indicating she needed to reapply. Informed patient that the letters were sent out late by [name] and they were also delayed to the USPS delays. Informed patient that she could disregard the letters as her application was submitted to [place] on 08/19/2019. Patient verbalized understanding.   Maile Linford P. Omid Deardorff, [place] Management [phone]

## 2019-09-02 ENCOUNTER — Other Ambulatory Visit: Payer: Self-pay | Admitting: Pharmacy Technician

## 2019-09-02 NOTE — Patient Outreach (Signed)
[facility]) Care Management  09/02/2019  RAQUITA SANT 06/29/1932 [id number]   Care coordination call placed to [place] in regards to patient's application for Basaglar and Humalog.  Spoke to Seth Bake who informed patient was APPROVED 08/31/2019-07/16/2020. She informed as order was requested but that if patient was in need of a refill, then she advised to have patient call into schedule a delivery.  Will outreach patient with this information.  Marybella Ethier P. Justus Droke, [place] Management [phone]

## 2019-09-02 NOTE — Patient Outreach (Signed)
[facility]) Care Management  09/02/2019  [name] Sep 19, 1931 [id number]   Successful call placed to patient regarding patient assistance application(s) for Basaglar and Humalog with [name] , HIPAA identifiers verified.   Informed patient she was approved for the program. Provided patient the phone number to set up delivery as well as to order a refill of the above named medications if she is in need of a refill. Patient verbalized understanding. Confirmed patient had name and number.  Follow up:  Will route note to [place] for case closure as patient assistance is complete.   Akeem Heppler P. Audree Schrecengost, [place] Management [phone]

## 2019-09-04 DIAGNOSIS — E1140 Type 2 diabetes mellitus with diabetic neuropathy, unspecified: Secondary | ICD-10-CM | POA: Diagnosis not present

## 2019-09-05 ENCOUNTER — Other Ambulatory Visit: Payer: Self-pay | Admitting: Pharmacy Technician

## 2019-09-05 NOTE — Patient Outreach (Signed)
[facility]) Care Management  09/05/2019  NAYONNA SIDERIS 12-22-1931 [id number]   Incoming call from patient regarding patient assistance medication delivery of Basaglar and Humalog from [place], HIPAA identifiers verified.   Patient called to inform she received the Humalog but not the [place]. She inquired if they are shipped separately.  Informed patient that the products are sometimes shipped separately based on the hub they are coming from. Provided patient the phone number to [facility] and advised her to call the pharmacy to inquire about the delivery of the [place]. Informed her they would be able to provide a tracking number or any other pertinent information. Informed patient to outreach me if they say the prescription or any other information is missing. Patient verbalized understanding. Confirmed patient had name and number.  Follow up:  Will follow up with patient as previously scheduled.  Marlen Koman P. Savhanna Sliva, [place] Management [phone]

## 2019-09-15 ENCOUNTER — Ambulatory Visit: Payer: Self-pay

## 2019-09-19 ENCOUNTER — Other Ambulatory Visit: Payer: Self-pay

## 2019-09-19 MED ORDER — NITROGLYCERIN 0.4 MG SL SUBL: SUBLINGUAL_TABLET | SUBLINGUAL | 4 refills | Status: DC

## 2019-09-22 ENCOUNTER — Encounter (HOSPITAL_COMMUNITY): Payer: HMO

## 2019-09-22 ENCOUNTER — Ambulatory Visit: Payer: HMO | Admitting: Surgery

## 2019-10-01 ENCOUNTER — Other Ambulatory Visit: Payer: Self-pay

## 2019-10-01 NOTE — Patient Outreach (Addendum)
[facility]) Care Management Chronic Special Needs Program  10/01/2019  Name: [name] DOB: 1931/12/23  MRN: [id number]  Ms. Izella Choyce is enrolled in a chronic special needs plan for Diabetes. Chronic Care Management Coordinator telephoned client to assist client in completing health risk assessment and to update individualized care plan.  RNCM reviewed the chronic care management program, importance of client participation, and taking their care plan to all provider appointments and inpatient facilities.    Subjective: client reports history of diabetes, HTN, heart disease. She denies any difficulty with medication management. She states she is able to get to her provider appointments. Client continues to have personal care assistant 2 hours/day to assist her husband(with dementia). Client without questions or concerns at this time.  Goals Addressed            This Visit's Progress   . Client will report no worsening of symptoms related to heart disease within the next 6 months   On track    Take your medications as prescribed. Please follow up with your provider visits as scheduled. Mailed education: "heart disease in diabetics". please review and call if you have any questions.    . Client will verbalize knowledge of self management of Hypertension as evidences by BP reading of 140/90 or less; or as defined by provider   On track    Follow up with your doctor as scheduled. Take your medications as prescribed by your doctor. Ask your doctor "what is my target blood pressure range". Monitor your blood pressure and take results to your doctor's appointment.  Monitor the amount of salt you are eating. Continue to exercise as tolerated and remain active. Eat heart healthy diet (full of fruits, vegetables, whole grains, lean protein, water--limit salt, fat, and sugar/simple carb intake). Mailed education: high blood pressure:what you can do. Please review and call if  you have any questions.    Marland Kitchen HEMOGLOBIN A1C < 7       Per client A1C 7.23 July 2019. Great job!  Renew goal:  Diabetes self management actions:  Glucose monitoring per provider recommendations  Eat Healthy  Visit provider every 3-6 months as directed  Hbg A1C level every 3-6 months.  Ask your doctor, "what is my target A1C goal?"  Ask your doctor, "what is my target blood sugar range?"     . Obtain annual  Lipid Profile, LDL-C   On track    It is important to follow up with your provider as scheduled for recommended labs. Try to avoid saturated fats, trans-fats and eat more fiber. Mailed education: "Heart Healthy diet". Please review and call if you have any questions.    . Obtain Annual Eye (retinal)  Exam    On track    It is important to follow up with your provider as scheduled for recommended exam.    . Obtain Annual Foot Exam   On track    It is important to follow up with your provider as scheduled for annual exam.    . Obtain annual screen for micro albuminuria (urine) , nephropathy (kidney problems)   On track    It is important to follow up with your provider as scheduled for recommended labs. This test looks at how your kidneys are working.    Illa Level Hemoglobin A1C at least 2 times per year   On track    Client reports last A1C 7.23 July 2019;  A1C 6.9 on 02/19/2019  Goal renewed  2021 Diabetes self management actions:  Glucose monitoring per provider recommendations  Eat Healthy  Check feet daily  Visit provider every 3-6 months as directed  Hbg A1C level every 3-6 months.  Eye Exam yearly    . COMPLETED: Patient Stated: continued wound healing bottom of left foot.   On track    Followed by Dr. Trula Slade vascular surgeon( last visit 06/23/2019) and ortho (last office visit 07/09/2019) Continue to attend provider visits as scheduled. Continue to take medications as scheduled. Client reports healed.     . Visit Primary Care Provider or  Endocrinologist at least 2 times per year    On track    It is important to follow up with your provider as scheduled for reccommended labs/procedures/prescription refills.      Client reports she has received both vaccinations for Covid 19. Covid 19 precautions discussed; encouraged client to call health care concierge for benefits questions. RNCM encouraged client to call RNCM as needed.  Plan: RNCM will send care plan to client; send updated care plan to primary care. Send Neurosurgeon. RNCM will follow up outreach within 6 months.    Thea Silversmith, RN, MSN, [place] [phone]  10/02/2019 Addendum: client reports foot wound healed.

## 2019-10-15 ENCOUNTER — Other Ambulatory Visit: Payer: Self-pay | Admitting: Pharmacist

## 2019-10-15 NOTE — Patient Outreach (Signed)
[facility] ([facility])  [facility] Quality Pharmacy Team    [facility] pharmacy case will be closed as our team is transitioning from the [facility] Care Management Department into the [facility] Quality Department and will no longer be using [facility] for documentation purposes.   [facility] pharmacy technician will continue to assist patient with medication assistance program applications until complete.       Verla Bryngelson J. Rosalene Wardrop, PharmD, BCACP [facility] Clinical Pharmacist [phone]   

## 2019-10-16 ENCOUNTER — Ambulatory Visit: Payer: Self-pay | Admitting: Pharmacist

## 2019-10-21 DIAGNOSIS — F3342 Major depressive disorder, recurrent, in full remission: Secondary | ICD-10-CM | POA: Diagnosis not present

## 2019-10-21 DIAGNOSIS — E785 Hyperlipidemia, unspecified: Secondary | ICD-10-CM | POA: Diagnosis not present

## 2019-10-21 DIAGNOSIS — Z89429 Acquired absence of other toe(s), unspecified side: Secondary | ICD-10-CM | POA: Diagnosis not present

## 2019-10-21 DIAGNOSIS — D649 Anemia, unspecified: Secondary | ICD-10-CM | POA: Diagnosis not present

## 2019-10-21 DIAGNOSIS — E1149 Type 2 diabetes mellitus with other diabetic neurological complication: Secondary | ICD-10-CM | POA: Diagnosis not present

## 2019-10-21 DIAGNOSIS — E1129 Type 2 diabetes mellitus with other diabetic kidney complication: Secondary | ICD-10-CM | POA: Diagnosis not present

## 2019-10-21 DIAGNOSIS — I739 Peripheral vascular disease, unspecified: Secondary | ICD-10-CM | POA: Diagnosis not present

## 2019-10-21 DIAGNOSIS — I10 Essential (primary) hypertension: Secondary | ICD-10-CM | POA: Diagnosis not present

## 2019-10-21 DIAGNOSIS — E1159 Type 2 diabetes mellitus with other circulatory complications: Secondary | ICD-10-CM | POA: Diagnosis not present

## 2019-10-21 DIAGNOSIS — I130 Hypertensive heart and chronic kidney disease with heart failure and stage 1 through stage 4 chronic kidney disease, or unspecified chronic kidney disease: Secondary | ICD-10-CM | POA: Diagnosis not present

## 2019-10-21 DIAGNOSIS — Z9861 Coronary angioplasty status: Secondary | ICD-10-CM | POA: Diagnosis not present

## 2019-10-22 DIAGNOSIS — D649 Anemia, unspecified: Secondary | ICD-10-CM | POA: Diagnosis not present

## 2019-10-23 ENCOUNTER — Telehealth (HOSPITAL_COMMUNITY): Payer: Self-pay

## 2019-10-23 NOTE — Telephone Encounter (Signed)

## 2019-10-27 ENCOUNTER — Ambulatory Visit (INDEPENDENT_AMBULATORY_CARE_PROVIDER_SITE_OTHER)
Admission: RE | Admit: 2019-10-27 | Discharge: 2019-10-27 | Disposition: A | Discharge status: Home / Self Care | Payer: HMO | Source: Ambulatory Visit | Attending: Surgery | Admitting: Surgery

## 2019-10-27 ENCOUNTER — Ambulatory Visit: Payer: HMO | Admitting: Surgery

## 2019-10-27 ENCOUNTER — Ambulatory Visit (HOSPITAL_COMMUNITY)
Admission: RE | Admit: 2019-10-27 | Discharge: 2019-10-27 | Disposition: A | Discharge status: Home / Self Care | Payer: HMO | Source: Ambulatory Visit | Attending: Surgery | Admitting: Surgery

## 2019-10-27 ENCOUNTER — Encounter: Payer: Self-pay | Admitting: Surgery

## 2019-10-27 ENCOUNTER — Other Ambulatory Visit: Payer: Self-pay

## 2019-10-27 VITALS — BP 133/58 | HR 63 | Temp 97.6°F | Resp 20 | Ht 68.0 in | Wt 183.0 lb

## 2019-10-27 DIAGNOSIS — I739 Peripheral vascular disease, unspecified: Secondary | ICD-10-CM

## 2019-10-27 DIAGNOSIS — I7025 Atherosclerosis of native arteries of other extremities with ulceration: Secondary | ICD-10-CM

## 2019-10-27 NOTE — Progress Notes (Signed)
[facility] [place]  Patient name: [name] MRN: [id number] DOB: [birth year] Sex: female   REASON FOR VISIT:    Follow up  HISOTRY OF PRESENT ILLNESS:    [name] is a 86 y.o. female who is status post atherectomy and angioplasty of an 80% ostial left peroneal artery stenosis and drug-eluting balloon angioplasty of  tandem 60-70% left superficial femoral artery lesions.  This was performed on 04/16/2018 for a wound which has been treated by both Dr. Sharol Given and podiatry.  This wound healed.  She developed a new wound and a repeat angiogram was performed on 09-03-2018.  I stented her left SFA and repeated PTA of her peroneal. Currently, she does not have any open wounds.  She denies any claudication.   The Patient suffers from coronary artery disease.   She has undergone PCI in the past. She is a diabetic. She is on statin for hypercholesterolemia and takes an ACE inhibitor for hypertension.  PAST MEDICAL HISTORY:   Past Medical History:  Diagnosis Date  . Anxiety   . CAD (coronary artery disease)   . Cellulitis 10/2015   LEFT FOOT  . Complication of anesthesia   . Coronary artery disease   . Diabetes mellitus without complication (HCC)    insulin dependent  . GERD (gastroesophageal reflux disease)   . Hypertension   . Hypothyroidism   . Neuromuscular disorder (HCC)    muscle cramps to lower extremities  . Other primary cardiomyopathies   . PONV (postoperative nausea and vomiting)   . Shortness of breath      FAMILY HISTORY:   Family History  Problem Relation Age of Onset  . Heart disease Father   . Heart attack Father   . Diabetes Sister   . Heart disease Son        before age 41  . Heart attack 53        84yr old  . Sudden death Grandchild   . Hypertension Neg Hx     SOCIAL HISTORY:   Social History   Tobacco Use  . Smoking status: Former Smoker    Quit date: 11/28/1960    Years  since quitting: 58.9  . Smokeless tobacco: Never Used  Substance Use Topics  . Alcohol use: Yes    Alcohol/week: 0.0 standard drinks    Comment: rare     ALLERGIES:   Allergies  Allergen Reactions  . Sulfa Antibiotics Nausea And Vomiting    Severe vomiting.      CURRENT MEDICATIONS:   Current Outpatient Medications  Medication Sig Dispense Refill  . amLODipine (NORVASC) 10 MG tablet TAKE 1 TABLET BY MOUTH ONCE DAILY 90 tablet 2  . aspirin EC 325 MG tablet Take 325 mg by mouth at bedtime.     Marland Kitchen atorvastatin (LIPITOR) 20 MG tablet Take 1 tablet (20 mg total) by mouth daily. 90 tablet 3  . carvedilol (COREG) 25 MG tablet Take 1 tablet (25 mg total) by mouth 2 (two) times daily with a meal. *Please call and schedule an appointment with Dr Fletcher Anon* 60 tablet 0  . cholecalciferol (VITAMIN D) 1000 units tablet Take 2,000 Units by mouth daily.     . clopidogrel (PLAVIX) 75 MG tablet TAKE 1 TABLET BY MOUTH ONCE DAILY 30 tablet 11  . Coenzyme Q10 (CO Q 10) 100 MG CAPS Take 1 capsule by mouth daily.     . ergocalciferol (VITAMIN D2) 1.25 MG (50000 UT) capsule Take 50,000 Units  by mouth once a week.    Marland Kitchen FREESTYLE LITE test strip USE TO CHECK BS TWICE DAILY  AND PRN    . Insulin Glargine (BASAGLAR KWIKPEN [place]) Inject 70 Units into the skin every evening.    . insulin lispro (HUMALOG) 100 UNIT/ML injection Inject 10 units with breakfast and 16 units with lunch and dinner,    . lisinopril (PRINIVIL,ZESTRIL) 20 MG tablet Take 20 mg by mouth every evening.     . nitroGLYCERIN (NITROSTAT) 0.4 MG SL tablet PLACE 1 TABLET UNDER THE TONGUE EVERY 5 MINUTES AS NEEDED FOR CHEST PAIN 25 tablet 4  . pantoprazole (PROTONIX) 40 MG tablet TAKE 1 TABLET BY MOUTH ONCE DAILY AT  6AM 90 tablet 2  . PARoxetine (PAXIL) 20 MG tablet paroxetine 20 mg tablet  TK 1 T PO QD     No current facility-administered medications for this visit.    REVIEW OF SYSTEMS:   [X]  denotes positive finding, [ ]  denotes negative  finding Cardiac  Comments:  Chest pain or chest pressure:    Shortness of breath upon exertion:    Short of breath when lying flat:    Irregular heart rhythm:        Vascular    Pain in calf, thigh, or hip brought on by ambulation:    Pain in feet at night that wakes you up from your sleep:     Blood clot in your veins:    Leg swelling:         Pulmonary    Oxygen at home:    Productive cough:     Wheezing:         Neurologic    Sudden weakness in arms or legs:     Sudden numbness in arms or legs:     Sudden onset of difficulty speaking or slurred speech:    Temporary loss of vision in one eye:     Problems with dizziness:         Gastrointestinal    Blood in stool:     Vomited blood:         Genitourinary    Burning when urinating:     Blood in urine:        Psychiatric    Major depression:         Hematologic    Bleeding problems:    Problems with blood clotting too easily:        Skin    Rashes or ulcers:        Constitutional    Fever or chills:      PHYSICAL EXAM:   There were no vitals filed for this visit.  GENERAL: The patient is a well-nourished female, in no acute distress. The vital signs are documented above. CARDIAC: There is a regular rate and rhythm.  PULMONARY: Non-labored respirations ABDOMEN: Soft and non-tender   MUSCULOSKELETAL: There are no major deformities or cyanosis. NEUROLOGIC: No focal weakness or paresthesias are detected. SKIN: There are no ulcers or rashes noted. PSYCHIATRIC: The patient has a normal affect.  STUDIES:   I have reviewed the following: +-------+-----------+-----------+------------+------------+  ABI/TBIToday's ABIToday's TBIPrevious ABIPrevious TBI  +-------+-----------+-----------+------------+------------+  Right 0.60    0.34    0.57    0.24      +-------+-----------+-----------+------------+------------+  Left  0.95    0.77    0.99    0.57        +-------+-----------+-----------+------------+------------+   LE Duplex: Left:  50-74% stenosis noted in the  proximal deep femoral artery.  50-74% stenosis noted in the proximal superficial femoral artery distal to  stent.  Patent stent with no evidence of in-stent stenosis in the proximal  superficial femoral artery.   MEDICAL ISSUES:   Patient does not have any active wounds.  Her duplex today shows that her interventions remain patent however her velocity profile is trending higher.  She will follow-up in 6 months to the [facility] clinic.  If her velocity profile continues to increase or she has a drop in her ABIs, she will need repeat angiography.    Leia Alf, MD, FACS [facility] [phone] Pager [phone]

## 2019-10-28 ENCOUNTER — Other Ambulatory Visit: Payer: Self-pay | Admitting: *Deleted

## 2019-10-28 DIAGNOSIS — I739 Peripheral vascular disease, unspecified: Secondary | ICD-10-CM

## 2019-10-28 DIAGNOSIS — I7025 Atherosclerosis of native arteries of other extremities with ulceration: Secondary | ICD-10-CM

## 2019-12-01 ENCOUNTER — Other Ambulatory Visit: Payer: Self-pay

## 2019-12-01 MED ORDER — AMLODIPINE BESYLATE 10 MG PO TABS: ORAL_TABLET | ORAL | 0 refills | Status: DC

## 2019-12-03 DIAGNOSIS — S82841A Displaced bimalleolar fracture of right lower leg, initial encounter for closed fracture: Secondary | ICD-10-CM | POA: Diagnosis not present

## 2019-12-03 DIAGNOSIS — M25571 Pain in right ankle and joints of right foot: Secondary | ICD-10-CM | POA: Diagnosis not present

## 2019-12-19 DIAGNOSIS — S82841D Displaced bimalleolar fracture of right lower leg, subsequent encounter for closed fracture with routine healing: Secondary | ICD-10-CM | POA: Diagnosis not present

## 2019-12-19 DIAGNOSIS — M25571 Pain in right ankle and joints of right foot: Secondary | ICD-10-CM | POA: Diagnosis not present

## 2020-01-01 DIAGNOSIS — L89892 Pressure ulcer of other site, stage 2: Secondary | ICD-10-CM | POA: Diagnosis not present

## 2020-01-01 DIAGNOSIS — S82841D Displaced bimalleolar fracture of right lower leg, subsequent encounter for closed fracture with routine healing: Secondary | ICD-10-CM | POA: Diagnosis not present

## 2020-01-01 DIAGNOSIS — M25571 Pain in right ankle and joints of right foot: Secondary | ICD-10-CM | POA: Diagnosis not present

## 2020-01-02 ENCOUNTER — Other Ambulatory Visit: Payer: Self-pay

## 2020-01-02 MED ORDER — PANTOPRAZOLE SODIUM 40 MG PO TBEC: DELAYED_RELEASE_TABLET | ORAL | 0 refills | Status: DC

## 2020-01-05 ENCOUNTER — Other Ambulatory Visit: Payer: Self-pay

## 2020-01-05 MED ORDER — ATORVASTATIN CALCIUM 20 MG PO TABS: 20.0000 mg | ORAL_TABLET | Freq: Every day | ORAL | 0 refills | Status: DC

## 2020-01-16 DIAGNOSIS — L89892 Pressure ulcer of other site, stage 2: Secondary | ICD-10-CM | POA: Diagnosis not present

## 2020-01-16 DIAGNOSIS — S82841D Displaced bimalleolar fracture of right lower leg, subsequent encounter for closed fracture with routine healing: Secondary | ICD-10-CM | POA: Diagnosis not present

## 2020-01-16 DIAGNOSIS — M79671 Pain in right foot: Secondary | ICD-10-CM | POA: Diagnosis not present

## 2020-01-16 DIAGNOSIS — M25571 Pain in right ankle and joints of right foot: Secondary | ICD-10-CM | POA: Diagnosis not present

## 2020-01-30 DIAGNOSIS — S82841D Displaced bimalleolar fracture of right lower leg, subsequent encounter for closed fracture with routine healing: Secondary | ICD-10-CM | POA: Diagnosis not present

## 2020-01-30 DIAGNOSIS — L89892 Pressure ulcer of other site, stage 2: Secondary | ICD-10-CM | POA: Diagnosis not present

## 2020-02-06 ENCOUNTER — Ambulatory Visit: Payer: HMO | Admitting: Interventional Cardiology

## 2020-02-13 DIAGNOSIS — S82841D Displaced bimalleolar fracture of right lower leg, subsequent encounter for closed fracture with routine healing: Secondary | ICD-10-CM | POA: Diagnosis not present

## 2020-02-13 DIAGNOSIS — M25571 Pain in right ankle and joints of right foot: Secondary | ICD-10-CM | POA: Diagnosis not present

## 2020-02-13 DIAGNOSIS — L89892 Pressure ulcer of other site, stage 2: Secondary | ICD-10-CM | POA: Diagnosis not present

## 2020-02-26 DIAGNOSIS — E785 Hyperlipidemia, unspecified: Secondary | ICD-10-CM | POA: Diagnosis not present

## 2020-02-26 DIAGNOSIS — N182 Chronic kidney disease, stage 2 (mild): Secondary | ICD-10-CM | POA: Diagnosis not present

## 2020-02-26 DIAGNOSIS — E042 Nontoxic multinodular goiter: Secondary | ICD-10-CM | POA: Diagnosis not present

## 2020-02-26 DIAGNOSIS — Z794 Long term (current) use of insulin: Secondary | ICD-10-CM | POA: Diagnosis not present

## 2020-02-26 DIAGNOSIS — E1129 Type 2 diabetes mellitus with other diabetic kidney complication: Secondary | ICD-10-CM | POA: Diagnosis not present

## 2020-02-26 DIAGNOSIS — I5032 Chronic diastolic (congestive) heart failure: Secondary | ICD-10-CM | POA: Diagnosis not present

## 2020-02-26 DIAGNOSIS — E1159 Type 2 diabetes mellitus with other circulatory complications: Secondary | ICD-10-CM | POA: Diagnosis not present

## 2020-02-26 DIAGNOSIS — I130 Hypertensive heart and chronic kidney disease with heart failure and stage 1 through stage 4 chronic kidney disease, or unspecified chronic kidney disease: Secondary | ICD-10-CM | POA: Diagnosis not present

## 2020-02-26 DIAGNOSIS — I2510 Atherosclerotic heart disease of native coronary artery without angina pectoris: Secondary | ICD-10-CM | POA: Diagnosis not present

## 2020-02-26 DIAGNOSIS — E11621 Type 2 diabetes mellitus with foot ulcer: Secondary | ICD-10-CM | POA: Diagnosis not present

## 2020-02-26 DIAGNOSIS — I739 Peripheral vascular disease, unspecified: Secondary | ICD-10-CM | POA: Diagnosis not present

## 2020-02-26 DIAGNOSIS — E1140 Type 2 diabetes mellitus with diabetic neuropathy, unspecified: Secondary | ICD-10-CM | POA: Diagnosis not present

## 2020-02-27 ENCOUNTER — Other Ambulatory Visit: Payer: Self-pay | Admitting: Interventional Cardiology

## 2020-02-27 DIAGNOSIS — S82841D Displaced bimalleolar fracture of right lower leg, subsequent encounter for closed fracture with routine healing: Secondary | ICD-10-CM | POA: Diagnosis not present

## 2020-02-27 DIAGNOSIS — M25571 Pain in right ankle and joints of right foot: Secondary | ICD-10-CM | POA: Diagnosis not present

## 2020-02-27 DIAGNOSIS — E11621 Type 2 diabetes mellitus with foot ulcer: Secondary | ICD-10-CM | POA: Diagnosis not present

## 2020-02-27 DIAGNOSIS — L89892 Pressure ulcer of other site, stage 2: Secondary | ICD-10-CM | POA: Diagnosis not present

## 2020-03-01 ENCOUNTER — Other Ambulatory Visit (HOSPITAL_COMMUNITY): Payer: Self-pay | Admitting: Orthopedic Surgery

## 2020-03-05 ENCOUNTER — Telehealth: Payer: Self-pay

## 2020-03-05 ENCOUNTER — Other Ambulatory Visit: Payer: Self-pay

## 2020-03-05 ENCOUNTER — Telehealth: Payer: Self-pay | Admitting: *Deleted

## 2020-03-05 ENCOUNTER — Encounter (HOSPITAL_BASED_OUTPATIENT_CLINIC_OR_DEPARTMENT_OTHER): Payer: Self-pay | Admitting: Orthopedic Surgery

## 2020-03-05 NOTE — Progress Notes (Signed)
Reviewed chart with Dr. Ola Spurr, anesthesiologist at [facility]. Pt will need to be seen by cardiologist and cleared for surgery. Patient is also on Plavix for PVD. Left message with Claiborne Billings at Dr. Nona Dell office to make her aware of clearance needed and if patient needs to stop plavix which we will need orders from her vascular doctor.

## 2020-03-05 NOTE — Telephone Encounter (Signed)
   [name] Pre-operative Risk Assessment    HEARTCARE STAFF: - Please ensure there is not already an duplicate clearance open for this procedure. - Under Visit Info/Reason for Call, type in Other and utilize the format Clearance MM/DD/YY or Clearance TBD. Do not use dashes or single digits. - If request is for dental extraction, please clarify the # of teeth to be extracted.  Request for surgical clearance:  1. What type of surgery is being performed? RIGHT FOOT TRANSMETATARSAL AMPUTATION   2. When is this surgery scheduled? 03/11/20   3. What type of clearance is required (medical clearance vs. Pharmacy clearance to hold med vs. Both)? MEDICAL  4. Are there any medications that need to be held prior to surgery and how long? ASA   5. Practice name and name of physician performing surgery? [facility]; DR. Jenny Reichmann [place]   6. What is the office phone number? [phone]   7.   What is the office fax number? [phone] ATTN: [place]  8.   Anesthesia type (None, local, MAC, general) ? CHOICE   Julaine Hua 03/05/2020, 2:51 PM  _________________________________________________________________   (provider comments below)

## 2020-03-05 NOTE — Telephone Encounter (Signed)
Patient called requesting to know if we received the Plavix Cleraence from Dr. Doran Durand that was faxed on 03/01/2020 to clear her from plavix prior to her procedure on 03/11/2020.

## 2020-03-08 ENCOUNTER — Other Ambulatory Visit (HOSPITAL_COMMUNITY)
Admission: RE | Admit: 2020-03-08 | Discharge: 2020-03-08 | Disposition: A | Discharge status: Home / Self Care | Payer: HMO | Source: Ambulatory Visit | Attending: Orthopedic Surgery | Admitting: Orthopedic Surgery

## 2020-03-08 ENCOUNTER — Encounter (HOSPITAL_BASED_OUTPATIENT_CLINIC_OR_DEPARTMENT_OTHER)
Admission: RE | Admit: 2020-03-08 | Discharge: 2020-03-08 | Disposition: A | Discharge status: Home / Self Care | Payer: HMO | Source: Ambulatory Visit | Attending: Orthopedic Surgery | Admitting: Orthopedic Surgery

## 2020-03-08 DIAGNOSIS — Z01818 Encounter for other preprocedural examination: Secondary | ICD-10-CM | POA: Diagnosis not present

## 2020-03-08 DIAGNOSIS — Z20822 Contact with and (suspected) exposure to covid-19: Secondary | ICD-10-CM | POA: Insufficient documentation

## 2020-03-08 LAB — BASIC METABOLIC PANEL
Anion gap: 11 (ref 5–15)
BUN: 19 mg/dL (ref 8–23)
CO2: 26 mmol/L (ref 22–32)
Calcium: 9 mg/dL (ref 8.9–10.3)
Chloride: 104 mmol/L (ref 98–111)
Creatinine, Ser: 0.8 mg/dL (ref 0.44–1.00)
GFR calc Af Amer: >60 mL/min (ref >60)
GFR calc non Af Amer: >60 mL/min (ref >60)
Glucose, Bld: 131 mg/dL — ABNORMAL HIGH (ref 70–99)
Potassium: 4 mmol/L (ref 3.5–5.1)
Sodium: 141 mmol/L (ref 135–145)

## 2020-03-08 LAB — SARS CORONAVIRUS 2 (TAT 6-24 HRS): SARS Coronavirus 2: NEGATIVE

## 2020-03-08 NOTE — Telephone Encounter (Signed)
Spoke with patient and informed her of the need for an appt prior to clearing her for her upcoming scheduled procedure. She agreed to see Dr Irish Lack tomorrow at [facility].

## 2020-03-08 NOTE — Progress Notes (Signed)

## 2020-03-08 NOTE — Telephone Encounter (Signed)
   [place], [place]  Chart reviewed as part of pre-operative protocol coverage. Because of [name]'s past medical history and time since last visit, they will require a follow-up visit in order to better assess preoperative cardiovascular risk.  Pre-op covering staff: - Please schedule appointment and call patient to inform them. If patient already had an upcoming appointment within acceptable timeframe, please add "pre-op clearance" to the appointment notes so provider is aware. - Please contact requesting surgeon's office via preferred method (i.e, phone, fax) to inform them of need for appointment prior to surgery.  If applicable, this message will also be routed to pharmacy pool and/or primary cardiologist for input on holding anticoagulant/antiplatelet agent as requested below so that this information is available to the clearing provider at time of patient's appointment.   [place], [place]  03/08/2020, 12:19 AM

## 2020-03-08 NOTE — Progress Notes (Signed)
Cardiology Office Note   Date:  03/09/2020   ID:  [name], [name] [birth year], MRN [id number]  PCP:  Marton Redwood, MD    No chief complaint on file.  Preoperative cardiovascular eval  Wt Readings from Last 3 Encounters:  03/09/20 173 lb (78.5 kg)  10/27/19 183 lb (83 kg)  06/23/19 181 lb 9.6 oz (82.4 kg)       History of Present Illness: [name] is a 86 y.o. female  who had a circumflex stent placed in Jan 2014. She had a PTCA of the OM. She has a CTO of the RCA with good collaterals, managed medically.   She is sen by vascular surgery and is status post atherectomy and angioplasty of an 80% ostial left peroneal artery stenosis and drug-eluting balloon angioplasty of  tandem 60-70% left superficial femoral artery lesions.  This was performed on 04/16/2018 for a wound which has been treated by both Dr. Sharol Given and podiatry.  This wound healed.  She developed a new wound and a repeat angiogram was performed on 09-03-2018.  I stented her left SFA and repeated PTA of her peroneal. Currently, she does not have any open wounds.  She denies any claudication.  She also has a chest mass which is enlarging thyroid, multinodular. THere is tracheal deviation.  Difficulty controlling her diabetes. SHe has had some low glucoses as well while trying for tighter control.   Her husband went to memory care in 2021.  She has moved to an apartment.  She gets around with a walker.    Denies : Chest pain. Dizziness. Leg edema. Nitroglycerin use. Orthopnea. Palpitations. Paroxysmal nocturnal dyspnea. Shortness of breath. Syncope.   Right ankle fracture.  Then she had a wound developed.  Now she needs two toes amputated from the right foot.  THis has limited her walking more recently.   Past Medical History:  Diagnosis Date  . Anxiety   . CAD (coronary artery disease)   . Cellulitis 10/2015   LEFT FOOT  . Complication of anesthesia   . Coronary artery disease   . Diabetes mellitus  without complication (HCC)    insulin dependent  . GERD (gastroesophageal reflux disease)   . Hypertension   . Hypothyroidism   . Neuromuscular disorder (HCC)    muscle cramps to lower extremities  . Other primary cardiomyopathies   . PONV (postoperative nausea and vomiting)   . Shortness of breath     Past Surgical History:  Procedure Laterality Date  . ABDOMINAL AORTOGRAM N/A 09/03/2018   Procedure: ABDOMINAL AORTOGRAM;  Surgeon: Serafina Mitchell, MD;  Location: [place] [facility];  Service: Cardiovascular;  Laterality: N/A;  . ABDOMINAL AORTOGRAM W/LOWER EXTREMITY N/A 04/16/2018   Procedure: ABDOMINAL AORTOGRAM W/LOWER EXTREMITY;  Surgeon: Serafina Mitchell, MD;  Location: [place] [facility];  Service: Cardiovascular;  Laterality: N/A;  unilateral  . ABDOMINAL HYSTERECTOMY    . AMPUTATION Left 12/03/2018   Procedure: Left 3rd ray amputation;  Surgeon: Wylene Simmer, MD;  Location: [place];  Service: Orthopedics;  Laterality: Left;  63min  . CHOLECYSTECTOMY    . CORONARY ANGIOPLASTY WITH STENT PLACEMENT  08/09/2011   DES  to mid circumflex  . I & D EXTREMITY Left 10/29/2015   Procedure: Irrigation and Debridement Left Foot;  Surgeon: Newt Minion, MD;  Location: [place];  Service: Orthopedics;  Laterality: Left;  . LEFT HEART CATHETERIZATION WITH CORONARY ANGIOGRAM N/A 08/08/2012   Procedure: LEFT HEART  CATHETERIZATION WITH CORONARY ANGIOGRAM;  Surgeon: [name], MD;  Location: [facility];  Service: Cardiovascular;  Laterality: N/A;  . PERIPHERAL VASCULAR ATHERECTOMY  04/16/2018   Procedure: PERIPHERAL VASCULAR ATHERECTOMY;  Surgeon: Serafina Mitchell, MD;  Location: [place] [facility];  Service: Cardiovascular;;  lt. Peroneal  . PERIPHERAL VASCULAR BALLOON ANGIOPLASTY  04/16/2018   Procedure: PERIPHERAL VASCULAR BALLOON ANGIOPLASTY;  Surgeon: Serafina Mitchell, MD;  Location: [place] [facility];  Service: Cardiovascular;;  lt. sfa and AT  . PERIPHERAL VASCULAR  BALLOON ANGIOPLASTY Left 09/03/2018   Procedure: PERIPHERAL VASCULAR BALLOON ANGIOPLASTY;  Surgeon: Serafina Mitchell, MD;  Location: [place] [facility];  Service: Cardiovascular;  Laterality: Left;  TP TRUNK  . PERIPHERAL VASCULAR CATHETERIZATION N/A 12/30/2014   Procedure: Lower Extremity Angiography;  Surgeon: Wellington Hampshire, MD;  Location: [place] [facility];  Service: Cardiovascular;  Laterality: N/A;  . PERIPHERAL VASCULAR INTERVENTION Left 09/03/2018   Procedure: PERIPHERAL VASCULAR INTERVENTION;  Surgeon: Serafina Mitchell, MD;  Location: [place] [facility];  Service: Cardiovascular;  Laterality: Left;  SFA STENT   . THYROID SURGERY     radioactive iodine   . TONSILLECTOMY       Current Outpatient Medications  Medication Sig Dispense Refill  . amLODipine (NORVASC) 10 MG tablet TAKE 1 TABLET BY MOUTH ONCE DAILY 90 tablet 0  . aspirin EC 325 MG tablet Take 325 mg by mouth at bedtime.     Marland Kitchen atorvastatin (LIPITOR) 20 MG tablet Take 1 tablet (20 mg total) by mouth daily. Please keep upcoming appt in July with Dr. Irish Lack before anymore refills. Thank you 90 tablet 0  . carvedilol (COREG) 25 MG tablet Take 1 tablet (25 mg total) by mouth 2 (two) times daily with a meal. *Please call and schedule an appointment with Dr Fletcher Anon* 60 tablet 0  . cholecalciferol (VITAMIN D) 1000 units tablet Take 2,000 Units by mouth daily.     . clopidogrel (PLAVIX) 75 MG tablet TAKE 1 TABLET BY MOUTH ONCE DAILY 30 tablet 11  . Coenzyme Q10 (CO Q 10) 100 MG CAPS Take 1 capsule by mouth daily.     Marland Kitchen doxycycline (VIBRAMYCIN) 100 MG capsule Take 100 mg by mouth 2 (two) times daily.    . ergocalciferol (VITAMIN D2) 1.25 MG (50000 UT) capsule Take 50,000 Units by mouth once a week.    Marland Kitchen FREESTYLE LITE test strip USE TO CHECK BS TWICE DAILY  AND PRN    . Insulin Glargine (BASAGLAR KWIKPEN [place]) Inject 70 Units into the skin every evening.    . insulin lispro (HUMALOG) 100 UNIT/ML injection Inject 10 units with breakfast  and 16 units with lunch and dinner,    . lisinopril (PRINIVIL,ZESTRIL) 20 MG tablet Take 20 mg by mouth every evening.     . nitroGLYCERIN (NITROSTAT) 0.4 MG SL tablet PLACE 1 TABLET UNDER THE TONGUE EVERY 5 MINUTES AS NEEDED FOR CHEST PAIN 25 tablet 4  . pantoprazole (PROTONIX) 40 MG tablet TAKE 1 TABLET BY MOUTH ONCE DAILY AT  6AM. Please keep upcoming appt in July with Dr. Irish Lack before anymore refills. Thank you 90 tablet 0  . PARoxetine (PAXIL) 20 MG tablet paroxetine 20 mg tablet  TK 1 T PO QD     No current facility-administered medications for this visit.    Allergies:   Sulfa antibiotics    Social History:  The patient  reports that she quit smoking about 59 years ago. She has never  used smokeless tobacco. She reports current alcohol use. She reports that she does not use drugs.   Family History:  The patient's family history includes Diabetes in her sister; Heart attack in her father and grandchild; Heart disease in her father and son; Sudden death in her grandchild.    ROS:  Please see the history of present illness.   Otherwise, review of systems are positive for limited mobility.   All other systems are reviewed and negative.    PHYSICAL EXAM: VS:  BP 136/60   Pulse 98   Ht 5\' 8"  (1.727 m)   Wt 173 lb (78.5 kg)   SpO2 96%   BMI 26.30 kg/m  , BMI Body mass index is 26.3 kg/m. GEN: Well nourished, well developed, in no acute distress  HEENT: normal  Neck: no JVD, carotid bruits, or masses Cardiac: RRR; no murmurs, rubs, or gallops,no; mild left leg edema  Respiratory:  clear to auscultation bilaterally, normal work of breathing GI: soft, nontender, nondistended, + BS MS: , right foot in brace Skin: warm and dry, no rash Neuro:  Strength and sensation are intact Psych: euthymic mood, full affect   EKG:   The ekg ordered today demonstrates NSR, lateral T wave inversion- slightly more pronounced   Recent Labs: 03/23/2020: BUN 19; Creatinine, Ser 0.80; Potassium  4.0; Sodium 141   Lipid Panel    Component Value Date/Time   CHOL 141 12/21/2014 0733   TRIG (H) 12/21/2014 0733    442.0 Triglyceride is over 400; calculations on Lipids are invalid.   HDL 29.10 (L) 12/21/2014 0733   CHOLHDL 5 12/21/2014 0733   VLDL 56.6 (H) 06/22/2014 0737   LDLCALC 24 10/20/2013 0739   LDLDIRECT 46.0 12/21/2014 0733     Other studies Reviewed: Additional studies/ records that were reviewed today with results demonstrating: vascular records reviewed.   ASSESSMENT AND PLAN:  1. CAD: No angina on medical therapy.  Known CAD in the circumflex distribution which has been managed medically.  COntinue aggressive secondary prevention.  Could decrease aspirin to 81 mg daily, especially if she is continuing on clopidogrel.  Will let them address based on bleeding at the time of surgery..   2. Preoperative eval: No further cardiac testing.  Age and known CAD are risk factors for her that cannot be modified, but the surgery itself is not a high risk surgery.  I suspect she will do well with surgery.  No indication for further ischemic testing and given gangrene,  Would not want to delay surgery unnecessarily. 3. PAD: Managed by vascular surgery.  Continue statin and healthy diet.  4. Hyperlipidemia: LDL controlled.  COntinue statin.  5. DM: Working on A1C control with PMD.    Current medicines are reviewed at length with the patient today.  The patient concerns regarding her medicines were addressed.  The following changes have been made:  No change  Labs/ tests ordered today include:  No orders of the defined types were placed in this encounter.   Recommend 150 minutes/week of aerobic exercise Low fat, low carb, high fiber diet recommended  Disposition:   FU in 1 year   Signed, Larae Grooms, MD  03/09/2020 3:46 PM    [facility] [address], [place], [place]  [address] Phone: [phone]; Fax: [phone]

## 2020-03-09 ENCOUNTER — Ambulatory Visit: Payer: HMO | Admitting: Interventional Cardiology

## 2020-03-09 ENCOUNTER — Encounter: Payer: Self-pay | Admitting: Interventional Cardiology

## 2020-03-09 ENCOUNTER — Ambulatory Visit (INDEPENDENT_AMBULATORY_CARE_PROVIDER_SITE_OTHER): Payer: HMO | Admitting: Interventional Cardiology

## 2020-03-09 ENCOUNTER — Other Ambulatory Visit: Payer: Self-pay

## 2020-03-09 VITALS — BP 136/60 | HR 98 | Ht 68.0 in | Wt 173.0 lb

## 2020-03-09 DIAGNOSIS — E1159 Type 2 diabetes mellitus with other circulatory complications: Secondary | ICD-10-CM

## 2020-03-09 DIAGNOSIS — I25119 Atherosclerotic heart disease of native coronary artery with unspecified angina pectoris: Secondary | ICD-10-CM

## 2020-03-09 DIAGNOSIS — Z01810 Encounter for preprocedural cardiovascular examination: Secondary | ICD-10-CM | POA: Diagnosis not present

## 2020-03-09 DIAGNOSIS — Z794 Long term (current) use of insulin: Secondary | ICD-10-CM

## 2020-03-09 DIAGNOSIS — E782 Mixed hyperlipidemia: Secondary | ICD-10-CM

## 2020-03-09 DIAGNOSIS — I739 Peripheral vascular disease, unspecified: Secondary | ICD-10-CM | POA: Diagnosis not present

## 2020-03-09 NOTE — Patient Instructions (Signed)

## 2020-03-11 ENCOUNTER — Ambulatory Visit (HOSPITAL_BASED_OUTPATIENT_CLINIC_OR_DEPARTMENT_OTHER): Payer: HMO | Admitting: Anesthesiology

## 2020-03-11 ENCOUNTER — Other Ambulatory Visit: Payer: Self-pay

## 2020-03-11 ENCOUNTER — Encounter (HOSPITAL_BASED_OUTPATIENT_CLINIC_OR_DEPARTMENT_OTHER): Payer: Self-pay | Admitting: Orthopedic Surgery

## 2020-03-11 ENCOUNTER — Encounter (HOSPITAL_BASED_OUTPATIENT_CLINIC_OR_DEPARTMENT_OTHER)
Admission: RE | Disposition: A | Discharge status: Home / Self Care | Payer: Self-pay | Source: Home / Self Care | Attending: Orthopedic Surgery

## 2020-03-11 ENCOUNTER — Ambulatory Visit (HOSPITAL_BASED_OUTPATIENT_CLINIC_OR_DEPARTMENT_OTHER)
Admission: RE | Admit: 2020-03-11 | Discharge: 2020-03-11 | Disposition: A | Discharge status: Home / Self Care | Payer: HMO | Source: Home / Self Care | Attending: Orthopedic Surgery | Admitting: Orthopedic Surgery

## 2020-03-11 DIAGNOSIS — Z7401 Bed confinement status: Secondary | ICD-10-CM | POA: Diagnosis not present

## 2020-03-11 DIAGNOSIS — E162 Hypoglycemia, unspecified: Secondary | ICD-10-CM | POA: Diagnosis not present

## 2020-03-11 DIAGNOSIS — Z66 Do not resuscitate: Secondary | ICD-10-CM | POA: Diagnosis not present

## 2020-03-11 DIAGNOSIS — J9690 Respiratory failure, unspecified, unspecified whether with hypoxia or hypercapnia: Secondary | ICD-10-CM | POA: Diagnosis not present

## 2020-03-11 DIAGNOSIS — E1152 Type 2 diabetes mellitus with diabetic peripheral angiopathy with gangrene: Secondary | ICD-10-CM | POA: Diagnosis present

## 2020-03-11 DIAGNOSIS — Z20822 Contact with and (suspected) exposure to covid-19: Secondary | ICD-10-CM | POA: Diagnosis present

## 2020-03-11 DIAGNOSIS — M2550 Pain in unspecified joint: Secondary | ICD-10-CM | POA: Diagnosis not present

## 2020-03-11 DIAGNOSIS — Z8249 Family history of ischemic heart disease and other diseases of the circulatory system: Secondary | ICD-10-CM | POA: Diagnosis not present

## 2020-03-11 DIAGNOSIS — E161 Other hypoglycemia: Secondary | ICD-10-CM | POA: Diagnosis not present

## 2020-03-11 DIAGNOSIS — J810 Acute pulmonary edema: Secondary | ICD-10-CM | POA: Diagnosis not present

## 2020-03-11 DIAGNOSIS — I700 Atherosclerosis of aorta: Secondary | ICD-10-CM | POA: Diagnosis not present

## 2020-03-11 DIAGNOSIS — E118 Type 2 diabetes mellitus with unspecified complications: Secondary | ICD-10-CM | POA: Diagnosis not present

## 2020-03-11 DIAGNOSIS — I5021 Acute systolic (congestive) heart failure: Secondary | ICD-10-CM | POA: Diagnosis not present

## 2020-03-11 DIAGNOSIS — I083 Combined rheumatic disorders of mitral, aortic and tricuspid valves: Secondary | ICD-10-CM | POA: Diagnosis present

## 2020-03-11 DIAGNOSIS — I429 Cardiomyopathy, unspecified: Secondary | ICD-10-CM | POA: Diagnosis present

## 2020-03-11 DIAGNOSIS — I5041 Acute combined systolic (congestive) and diastolic (congestive) heart failure: Secondary | ICD-10-CM | POA: Diagnosis present

## 2020-03-11 DIAGNOSIS — M6281 Muscle weakness (generalized): Secondary | ICD-10-CM | POA: Diagnosis not present

## 2020-03-11 DIAGNOSIS — Z4781 Encounter for orthopedic aftercare following surgical amputation: Secondary | ICD-10-CM | POA: Diagnosis not present

## 2020-03-11 DIAGNOSIS — I110 Hypertensive heart disease with heart failure: Secondary | ICD-10-CM | POA: Diagnosis present

## 2020-03-11 DIAGNOSIS — J9811 Atelectasis: Secondary | ICD-10-CM | POA: Diagnosis present

## 2020-03-11 DIAGNOSIS — E1140 Type 2 diabetes mellitus with diabetic neuropathy, unspecified: Secondary | ICD-10-CM | POA: Diagnosis present

## 2020-03-11 DIAGNOSIS — Z7982 Long term (current) use of aspirin: Secondary | ICD-10-CM | POA: Insufficient documentation

## 2020-03-11 DIAGNOSIS — J811 Chronic pulmonary edema: Secondary | ICD-10-CM | POA: Diagnosis not present

## 2020-03-11 DIAGNOSIS — R7989 Other specified abnormal findings of blood chemistry: Secondary | ICD-10-CM | POA: Diagnosis not present

## 2020-03-11 DIAGNOSIS — Z955 Presence of coronary angioplasty implant and graft: Secondary | ICD-10-CM | POA: Insufficient documentation

## 2020-03-11 DIAGNOSIS — Z79899 Other long term (current) drug therapy: Secondary | ICD-10-CM | POA: Diagnosis not present

## 2020-03-11 DIAGNOSIS — I25111 Atherosclerotic heart disease of native coronary artery with angina pectoris with documented spasm: Secondary | ICD-10-CM | POA: Diagnosis not present

## 2020-03-11 DIAGNOSIS — J189 Pneumonia, unspecified organism: Secondary | ICD-10-CM | POA: Diagnosis not present

## 2020-03-11 DIAGNOSIS — Z882 Allergy status to sulfonamides status: Secondary | ICD-10-CM | POA: Diagnosis not present

## 2020-03-11 DIAGNOSIS — E039 Hypothyroidism, unspecified: Secondary | ICD-10-CM | POA: Diagnosis present

## 2020-03-11 DIAGNOSIS — Z89429 Acquired absence of other toe(s), unspecified side: Secondary | ICD-10-CM | POA: Insufficient documentation

## 2020-03-11 DIAGNOSIS — S98311D Complete traumatic amputation of right midfoot, subsequent encounter: Secondary | ICD-10-CM | POA: Diagnosis not present

## 2020-03-11 DIAGNOSIS — J9601 Acute respiratory failure with hypoxia: Secondary | ICD-10-CM | POA: Diagnosis present

## 2020-03-11 DIAGNOSIS — I739 Peripheral vascular disease, unspecified: Secondary | ICD-10-CM | POA: Diagnosis not present

## 2020-03-11 DIAGNOSIS — E11621 Type 2 diabetes mellitus with foot ulcer: Secondary | ICD-10-CM | POA: Diagnosis not present

## 2020-03-11 DIAGNOSIS — Y95 Nosocomial condition: Secondary | ICD-10-CM | POA: Diagnosis not present

## 2020-03-11 DIAGNOSIS — E049 Nontoxic goiter, unspecified: Secondary | ICD-10-CM | POA: Diagnosis not present

## 2020-03-11 DIAGNOSIS — K314 Gastric diverticulum: Secondary | ICD-10-CM | POA: Diagnosis not present

## 2020-03-11 DIAGNOSIS — E042 Nontoxic multinodular goiter: Secondary | ICD-10-CM | POA: Diagnosis not present

## 2020-03-11 DIAGNOSIS — R778 Other specified abnormalities of plasma proteins: Secondary | ICD-10-CM | POA: Diagnosis not present

## 2020-03-11 DIAGNOSIS — I2510 Atherosclerotic heart disease of native coronary artery without angina pectoris: Secondary | ICD-10-CM | POA: Diagnosis present

## 2020-03-11 DIAGNOSIS — I5032 Chronic diastolic (congestive) heart failure: Secondary | ICD-10-CM | POA: Diagnosis not present

## 2020-03-11 DIAGNOSIS — Z794 Long term (current) use of insulin: Secondary | ICD-10-CM | POA: Insufficient documentation

## 2020-03-11 DIAGNOSIS — R0902 Hypoxemia: Secondary | ICD-10-CM | POA: Diagnosis not present

## 2020-03-11 DIAGNOSIS — I501 Left ventricular failure: Secondary | ICD-10-CM | POA: Diagnosis not present

## 2020-03-11 DIAGNOSIS — E782 Mixed hyperlipidemia: Secondary | ICD-10-CM | POA: Diagnosis not present

## 2020-03-11 DIAGNOSIS — Z89422 Acquired absence of other left toe(s): Secondary | ICD-10-CM | POA: Diagnosis not present

## 2020-03-11 DIAGNOSIS — I25119 Atherosclerotic heart disease of native coronary artery with unspecified angina pectoris: Secondary | ICD-10-CM | POA: Diagnosis not present

## 2020-03-11 DIAGNOSIS — I10 Essential (primary) hypertension: Secondary | ICD-10-CM | POA: Diagnosis not present

## 2020-03-11 DIAGNOSIS — I509 Heart failure, unspecified: Secondary | ICD-10-CM | POA: Diagnosis not present

## 2020-03-11 DIAGNOSIS — Z87891 Personal history of nicotine dependence: Secondary | ICD-10-CM | POA: Diagnosis not present

## 2020-03-11 DIAGNOSIS — K5732 Diverticulitis of large intestine without perforation or abscess without bleeding: Secondary | ICD-10-CM | POA: Diagnosis present

## 2020-03-11 DIAGNOSIS — J181 Lobar pneumonia, unspecified organism: Secondary | ICD-10-CM | POA: Diagnosis not present

## 2020-03-11 DIAGNOSIS — K219 Gastro-esophageal reflux disease without esophagitis: Secondary | ICD-10-CM | POA: Diagnosis present

## 2020-03-11 DIAGNOSIS — L97519 Non-pressure chronic ulcer of other part of right foot with unspecified severity: Secondary | ICD-10-CM | POA: Diagnosis not present

## 2020-03-11 DIAGNOSIS — M6701 Short Achilles tendon (acquired), right ankle: Secondary | ICD-10-CM | POA: Diagnosis not present

## 2020-03-11 DIAGNOSIS — Z7902 Long term (current) use of antithrombotics/antiplatelets: Secondary | ICD-10-CM | POA: Diagnosis not present

## 2020-03-11 DIAGNOSIS — K3189 Other diseases of stomach and duodenum: Secondary | ICD-10-CM | POA: Diagnosis not present

## 2020-03-11 DIAGNOSIS — L97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: Secondary | ICD-10-CM

## 2020-03-11 DIAGNOSIS — R0602 Shortness of breath: Secondary | ICD-10-CM | POA: Diagnosis present

## 2020-03-11 DIAGNOSIS — Z9049 Acquired absence of other specified parts of digestive tract: Secondary | ICD-10-CM | POA: Diagnosis not present

## 2020-03-11 DIAGNOSIS — J90 Pleural effusion, not elsewhere classified: Secondary | ICD-10-CM | POA: Diagnosis not present

## 2020-03-11 DIAGNOSIS — F419 Anxiety disorder, unspecified: Secondary | ICD-10-CM | POA: Diagnosis present

## 2020-03-11 DIAGNOSIS — Z833 Family history of diabetes mellitus: Secondary | ICD-10-CM | POA: Diagnosis not present

## 2020-03-11 DIAGNOSIS — I5030 Unspecified diastolic (congestive) heart failure: Secondary | ICD-10-CM | POA: Diagnosis not present

## 2020-03-11 DIAGNOSIS — L97521 Non-pressure chronic ulcer of other part of left foot limited to breakdown of skin: Secondary | ICD-10-CM | POA: Diagnosis not present

## 2020-03-11 HISTORY — PX: TRANSMETATARSAL AMPUTATION: SHX6197

## 2020-03-11 HISTORY — PX: TENDON RELEASE: SHX230

## 2020-03-11 LAB — GLUCOSE, CAPILLARY
Glucose-Capillary: 168 mg/dL — ABNORMAL HIGH (ref 70–99)
Glucose-Capillary: 177 mg/dL — ABNORMAL HIGH (ref 70–99)

## 2020-03-11 SURGERY — AMPUTATION, FOOT, TRANSMETATARSAL
Anesthesia: General | Site: Foot | Laterality: Right

## 2020-03-11 MED ORDER — ACETAMINOPHEN 325 MG PO TABS: 325.0000 mg | ORAL_TABLET | ORAL | Status: DC | PRN

## 2020-03-11 MED ORDER — EPHEDRINE 5 MG/ML INJ
INTRAVENOUS | Status: AC
  Filled 2020-03-11: qty 10

## 2020-03-11 MED ORDER — FENTANYL CITRATE (PF) 100 MCG/2ML IJ SOLN: 25.0000 ug | INTRAMUSCULAR | Status: DC | PRN

## 2020-03-11 MED ORDER — ROPIVACAINE HCL 7.5 MG/ML IJ SOLN
INTRAMUSCULAR | Status: DC | PRN
  Administered 2020-03-11 (×8): 5 mL via PERINEURAL

## 2020-03-11 MED ORDER — SODIUM CHLORIDE 0.9 % IR SOLN
Status: DC | PRN
  Administered 2020-03-11: 3000 mL

## 2020-03-11 MED ORDER — SODIUM CHLORIDE 0.9 % IV SOLN: INTRAVENOUS | Status: DC

## 2020-03-11 MED ORDER — FENTANYL CITRATE (PF) 100 MCG/2ML IJ SOLN
INTRAMUSCULAR | Status: AC
  Filled 2020-03-11: qty 2

## 2020-03-11 MED ORDER — ONDANSETRON HCL 4 MG/2ML IJ SOLN
INTRAMUSCULAR | Status: DC | PRN
  Administered 2020-03-11: 4 mg via INTRAVENOUS

## 2020-03-11 MED ORDER — OXYCODONE HCL 5 MG/5ML PO SOLN: 5.0000 mg | Freq: Once | ORAL | Status: DC | PRN

## 2020-03-11 MED ORDER — OXYCODONE HCL 5 MG PO TABS: 5.0000 mg | ORAL_TABLET | Freq: Once | ORAL | Status: DC | PRN

## 2020-03-11 MED ORDER — PROPOFOL 500 MG/50ML IV EMUL
INTRAVENOUS | Status: AC
  Filled 2020-03-11: qty 50

## 2020-03-11 MED ORDER — PROPOFOL 500 MG/50ML IV EMUL
INTRAVENOUS | Status: DC | PRN
  Administered 2020-03-11: 75 ug/kg/min via INTRAVENOUS

## 2020-03-11 MED ORDER — TRAMADOL HCL 50 MG PO TABS: 50.0000 mg | ORAL_TABLET | Freq: Four times a day (QID) | ORAL | 0 refills | Status: DC | PRN

## 2020-03-11 MED ORDER — LIDOCAINE 2% (20 MG/ML) 5 ML SYRINGE
INTRAMUSCULAR | Status: AC
  Filled 2020-03-11: qty 5

## 2020-03-11 MED ORDER — LIDOCAINE HCL (CARDIAC) PF 100 MG/5ML IV SOSY
PREFILLED_SYRINGE | INTRAVENOUS | Status: DC | PRN
  Administered 2020-03-11: 40 mg via INTRAVENOUS

## 2020-03-11 MED ORDER — DOXYCYCLINE MONOHYDRATE 100 MG PO TABS: 100.0000 mg | ORAL_TABLET | Freq: Two times a day (BID) | ORAL | 0 refills | Status: AC

## 2020-03-11 MED ORDER — FENTANYL CITRATE (PF) 100 MCG/2ML IJ SOLN
50.0000 ug | Freq: Once | INTRAMUSCULAR | Status: AC
  Administered 2020-03-11: 100 ug via INTRAVENOUS

## 2020-03-11 MED ORDER — MIDAZOLAM HCL 2 MG/2ML IJ SOLN
INTRAMUSCULAR | Status: AC
  Filled 2020-03-11: qty 2

## 2020-03-11 MED ORDER — EPHEDRINE SULFATE 50 MG/ML IJ SOLN
INTRAMUSCULAR | Status: DC | PRN
  Administered 2020-03-11: 10 mg via INTRAVENOUS
  Administered 2020-03-11: 15 mg via INTRAVENOUS

## 2020-03-11 MED ORDER — MEPERIDINE HCL 25 MG/ML IJ SOLN: 6.2500 mg | INTRAMUSCULAR | Status: DC | PRN

## 2020-03-11 MED ORDER — ONDANSETRON HCL 4 MG/2ML IJ SOLN
INTRAMUSCULAR | Status: AC
  Filled 2020-03-11: qty 2

## 2020-03-11 MED ORDER — CLONIDINE HCL (ANALGESIA) 100 MCG/ML EP SOLN
EPIDURAL | Status: DC | PRN
  Administered 2020-03-11 (×2): 100 ug

## 2020-03-11 MED ORDER — ONDANSETRON HCL 4 MG/2ML IJ SOLN: 4.0000 mg | Freq: Once | INTRAMUSCULAR | Status: DC | PRN

## 2020-03-11 MED ORDER — 0.9 % SODIUM CHLORIDE (POUR BTL) OPTIME
TOPICAL | Status: DC | PRN
  Administered 2020-03-11: 500 mL

## 2020-03-11 MED ORDER — LACTATED RINGERS IV SOLN: INTRAVENOUS | Status: DC

## 2020-03-11 MED ORDER — ROPIVACAINE HCL 5 MG/ML IJ SOLN
INTRAMUSCULAR | Status: DC | PRN
  Administered 2020-03-11 (×2): 5 mL via PERINEURAL

## 2020-03-11 MED ORDER — LIDOCAINE 2% (20 MG/ML) 5 ML SYRINGE: INTRAMUSCULAR | Status: DC | PRN

## 2020-03-11 MED ORDER — CEFAZOLIN SODIUM-DEXTROSE 2-4 GM/100ML-% IV SOLN
2.0000 g | INTRAVENOUS | Status: AC
  Administered 2020-03-11: 2 g via INTRAVENOUS

## 2020-03-11 MED ORDER — LACTATED RINGERS IV BOLUS: 250.0000 mL | Freq: Once | INTRAVENOUS | Status: DC

## 2020-03-11 MED ORDER — VANCOMYCIN HCL 500 MG IV SOLR
INTRAVENOUS | Status: DC | PRN
  Administered 2020-03-11: 500 mg via TOPICAL

## 2020-03-11 MED ORDER — DEXAMETHASONE SODIUM PHOSPHATE 10 MG/ML IJ SOLN
INTRAMUSCULAR | Status: AC
  Filled 2020-03-11: qty 1

## 2020-03-11 MED ORDER — CEFAZOLIN SODIUM-DEXTROSE 2-4 GM/100ML-% IV SOLN
INTRAVENOUS | Status: AC
  Filled 2020-03-11: qty 100

## 2020-03-11 MED ORDER — ACETAMINOPHEN 160 MG/5ML PO SOLN: 325.0000 mg | ORAL | Status: DC | PRN

## 2020-03-11 SURGICAL SUPPLY — 73 items
APL PRP STRL LF DISP 70% ISPRP (MISCELLANEOUS)
BLADE AVERAGE 25X9 (BLADE) IMPLANT
BLADE MICRO SAGITTAL (BLADE) ×1 IMPLANT
BLADE OSC/SAG .038X5.5 CUT EDG (BLADE) IMPLANT
BLADE SURG 10 STRL SS (BLADE) ×2 IMPLANT
BLADE SURG 15 STRL LF DISP TIS (BLADE) ×1 IMPLANT
BLADE SURG 15 STRL SS (BLADE) ×2
BNDG CMPR 9X4 STRL LF SNTH (GAUZE/BANDAGES/DRESSINGS)
BNDG COHESIVE 4X5 TAN STRL (GAUZE/BANDAGES/DRESSINGS) ×1 IMPLANT
BNDG CONFORM 3 STRL LF (GAUZE/BANDAGES/DRESSINGS) ×1 IMPLANT
BNDG ELASTIC 4X5.8 VLCR STR LF (GAUZE/BANDAGES/DRESSINGS) ×2 IMPLANT
BNDG ESMARK 4X9 LF (GAUZE/BANDAGES/DRESSINGS) ×1 IMPLANT
BOOT STEPPER DURA SM (SOFTGOODS) ×1 IMPLANT
BRUSH SCRUB EZ PLAIN DRY (MISCELLANEOUS) ×1 IMPLANT
CHLORAPREP W/TINT 26 (MISCELLANEOUS) ×1 IMPLANT
COVER BACK TABLE 60X90IN (DRAPES) ×2 IMPLANT
COVER WAND RF STERILE (DRAPES) IMPLANT
CUFF TOURN SGL QUICK 24 (TOURNIQUET CUFF) ×2
CUFF TRNQT CYL 24X4X16.5-23 (TOURNIQUET CUFF) IMPLANT
DECANTER SPIKE VIAL GLASS SM (MISCELLANEOUS) IMPLANT
DRAPE EXTREMITY T 121X128X90 (DISPOSABLE) ×2 IMPLANT
DRAPE OEC MINIVIEW 54X84 (DRAPES) IMPLANT
DRAPE SURG 17X23 STRL (DRAPES) IMPLANT
DRAPE U-SHAPE 47X51 STRL (DRAPES) ×1 IMPLANT
DRSG MEPITEL 4X7.2 (GAUZE/BANDAGES/DRESSINGS) ×2 IMPLANT
DRSG PAD ABDOMINAL 8X10 ST (GAUZE/BANDAGES/DRESSINGS) ×1 IMPLANT
ELECT REM PT RETURN 9FT ADLT (ELECTROSURGICAL) ×2
ELECTRODE REM PT RTRN 9FT ADLT (ELECTROSURGICAL) ×1 IMPLANT
GAUZE SPONGE 4X4 12PLY STRL (GAUZE/BANDAGES/DRESSINGS) ×2 IMPLANT
GLOVE BIO SURGEON STRL SZ8 (GLOVE) ×2 IMPLANT
GLOVE BIOGEL PI IND STRL 6.5 (GLOVE) IMPLANT
GLOVE BIOGEL PI IND STRL 7.0 (GLOVE) IMPLANT
GLOVE BIOGEL PI IND STRL 8 (GLOVE) ×2 IMPLANT
GLOVE BIOGEL PI INDICATOR 6.5 (GLOVE) ×1
GLOVE BIOGEL PI INDICATOR 7.0 (GLOVE) ×1
GLOVE BIOGEL PI INDICATOR 8 (GLOVE) ×2
GLOVE ECLIPSE 6.5 STRL STRAW (GLOVE) ×1 IMPLANT
GLOVE ECLIPSE 8.0 STRL XLNG CF (GLOVE) ×2 IMPLANT
GOWN STRL REUS W/ TWL LRG LVL3 (GOWN DISPOSABLE) ×1 IMPLANT
GOWN STRL REUS W/ TWL XL LVL3 (GOWN DISPOSABLE) ×2 IMPLANT
GOWN STRL REUS W/TWL LRG LVL3 (GOWN DISPOSABLE) ×2
GOWN STRL REUS W/TWL XL LVL3 (GOWN DISPOSABLE) ×4
NDL HYPO 25X1 1.5 SAFETY (NEEDLE) IMPLANT
NDL SAFETY ECLIPSE 18X1.5 (NEEDLE) IMPLANT
NEEDLE HYPO 18GX1.5 SHARP (NEEDLE)
NEEDLE HYPO 25X1 1.5 SAFETY (NEEDLE) IMPLANT
NS IRRIG 1000ML POUR BTL (IV SOLUTION) ×2 IMPLANT
PACK BASIN DAY SURGERY FS (CUSTOM PROCEDURE TRAY) ×2 IMPLANT
PAD CAST 4YDX4 CTTN HI CHSV (CAST SUPPLIES) ×1 IMPLANT
PADDING CAST COTTON 4X4 STRL (CAST SUPPLIES) ×2
PENCIL SMOKE EVACUATOR (MISCELLANEOUS) ×2 IMPLANT
SANITIZER HAND PURELL 535ML FO (MISCELLANEOUS) IMPLANT
SET IRRIG Y TYPE TUR BLADDER L (SET/KITS/TRAYS/PACK) ×1 IMPLANT
SHEET MEDIUM DRAPE 40X70 STRL (DRAPES) ×2 IMPLANT
SLEEVE SCD COMPRESS KNEE MED (MISCELLANEOUS) ×2 IMPLANT
SPONGE LAP 18X18 RF (DISPOSABLE) ×2 IMPLANT
STOCKINETTE 6  STRL (DRAPES) ×2
STOCKINETTE 6 STRL (DRAPES) ×1 IMPLANT
SUCTION FRAZIER HANDLE 10FR (MISCELLANEOUS) ×2
SUCTION TUBE FRAZIER 10FR DISP (MISCELLANEOUS) IMPLANT
SUT ETHILON 2 0 FS 18 (SUTURE) ×3 IMPLANT
SUT ETHILON 2 0 FSLX (SUTURE) IMPLANT
SUT ETHILON 3 0 PS 1 (SUTURE) IMPLANT
SUT MNCRL AB 3-0 PS2 18 (SUTURE) ×1 IMPLANT
SUT PDS AB 0 CT 36 (SUTURE) IMPLANT
SUT PDS AB 2-0 CT2 27 (SUTURE) IMPLANT
SWAB COLLECTION DEVICE MRSA (MISCELLANEOUS) IMPLANT
SYR BULB EAR ULCER 3OZ GRN STR (SYRINGE) ×2 IMPLANT
SYR CONTROL 10ML LL (SYRINGE) IMPLANT
TOWEL GREEN STERILE FF (TOWEL DISPOSABLE) ×2 IMPLANT
TRAY DSU PREP LF (CUSTOM PROCEDURE TRAY) ×1 IMPLANT
TUBE CONNECTING 20X1/4 (TUBING) ×1 IMPLANT
UNDERPAD 30X36 HEAVY ABSORB (UNDERPADS AND DIAPERS) ×2 IMPLANT

## 2020-03-11 NOTE — Progress Notes (Signed)
CRNA paged to bedside for low BP.  Recommends 250 LR bolus. HOB back down. Patient alert and oriented, asymptomatic of low BP.

## 2020-03-11 NOTE — H&P (Signed)
[name] is an 86 y.o. female.   Chief Complaint: Right forefoot ulcer HPI: The patient is an 86 year old female with past medical history significant for diabetes complicated by previous toe amputations.  She has developed a chronic nonhealing ulcer of the right forefoot beneath the first ray.  This condition has progressed with gangrenous changes extending into the plantar arch since I saw her last.  She presents now for transmetatarsal amputation and heel cord lengthening.  She has failed all nonoperative treatment to date.  Past Medical History:  Diagnosis Date  . Anxiety   . CAD (coronary artery disease)   . Cellulitis 10/2015   LEFT FOOT  . Complication of anesthesia   . Coronary artery disease   . Diabetes mellitus without complication (HCC)    insulin dependent  . GERD (gastroesophageal reflux disease)   . Hypertension   . Hypothyroidism   . Neuromuscular disorder (HCC)    muscle cramps to lower extremities  . Other primary cardiomyopathies   . PONV (postoperative nausea and vomiting)   . Shortness of breath     Past Surgical History:  Procedure Laterality Date  . ABDOMINAL AORTOGRAM N/A 09/03/2018   Procedure: ABDOMINAL AORTOGRAM;  Surgeon: Serafina Mitchell, MD;  Location: [facility];  Service: Cardiovascular;  Laterality: N/A;  . ABDOMINAL AORTOGRAM W/LOWER EXTREMITY N/A 04/16/2018   Procedure: ABDOMINAL AORTOGRAM W/LOWER EXTREMITY;  Surgeon: Serafina Mitchell, MD;  Location: [facility];  Service: Cardiovascular;  Laterality: N/A;  unilateral  . ABDOMINAL HYSTERECTOMY    . AMPUTATION Left 12/03/2018   Procedure: Left 3rd ray amputation;  Surgeon: Wylene Simmer, MD;  Location: [facility];  Service: Orthopedics;  Laterality: Left;  11min  . CHOLECYSTECTOMY    . CORONARY ANGIOPLASTY WITH STENT PLACEMENT  08/09/2011   DES  to mid circumflex  . I & D EXTREMITY Left 10/29/2015   Procedure: Irrigation and Debridement Left Foot;  Surgeon: Newt Minion, MD;  Location: [facility];  Service: Orthopedics;  Laterality: Left;  . LEFT HEART CATHETERIZATION WITH CORONARY ANGIOGRAM N/A 08/08/2012   Procedure: LEFT HEART CATHETERIZATION WITH CORONARY ANGIOGRAM;  Surgeon: [name], MD;  Location: [facility];  Service: Cardiovascular;  Laterality: N/A;  . PERIPHERAL VASCULAR ATHERECTOMY  04/16/2018   Procedure: PERIPHERAL VASCULAR ATHERECTOMY;  Surgeon: Serafina Mitchell, MD;  Location: [place];  Service: Cardiovascular;;  lt. Peroneal  . PERIPHERAL VASCULAR BALLOON ANGIOPLASTY  04/16/2018   Procedure: PERIPHERAL VASCULAR BALLOON ANGIOPLASTY;  Surgeon: Serafina Mitchell, MD;  Location: [facility];  Service: Cardiovascular;;  lt. sfa and AT  . PERIPHERAL VASCULAR BALLOON ANGIOPLASTY Left 09/03/2018   Procedure: PERIPHERAL VASCULAR BALLOON ANGIOPLASTY;  Surgeon: Serafina Mitchell, MD;  Location: [facility];  Service: Cardiovascular;  Laterality: Left;  TP TRUNK  . PERIPHERAL VASCULAR CATHETERIZATION N/A 12/30/2014   Procedure: Lower Extremity Angiography;  Surgeon: Wellington Hampshire, MD;  Location: [facility];  Service: Cardiovascular;  Laterality: N/A;  . PERIPHERAL VASCULAR INTERVENTION Left 09/03/2018   Procedure: PERIPHERAL VASCULAR INTERVENTION;  Surgeon: Serafina Mitchell, MD;  Location: [facility];  Service: Cardiovascular;  Laterality: Left;  SFA STENT   . THYROID SURGERY     radioactive iodine   . TONSILLECTOMY      Family History  Problem Relation Age of Onset  . Heart disease Father   . Heart attack Father   . Diabetes Sister   .  Heart disease Son        before age 55  . Heart attack 61        84yr old  . Sudden death Grandchild   . Hypertension Neg Hx    Social History:  reports that she quit smoking about 59 years ago. She has never used smokeless tobacco. She reports current alcohol use. She reports that she does not use drugs.  Allergies:  Allergies  Allergen Reactions  . Sulfa  Antibiotics Nausea And Vomiting    Severe vomiting.     Medications Prior to Admission  Medication Sig Dispense Refill  . amLODipine (NORVASC) 10 MG tablet TAKE 1 TABLET BY MOUTH ONCE DAILY 90 tablet 0  . aspirin EC 325 MG tablet Take 325 mg by mouth at bedtime.     Marland Kitchen atorvastatin (LIPITOR) 20 MG tablet Take 1 tablet (20 mg total) by mouth daily. Please keep upcoming appt in July with Dr. Irish Lack before anymore refills. Thank you 90 tablet 0  . carvedilol (COREG) 25 MG tablet Take 1 tablet (25 mg total) by mouth 2 (two) times daily with a meal. *Please call and schedule an appointment with Dr Fletcher Anon* 60 tablet 0  . cholecalciferol (VITAMIN D) 1000 units tablet Take 2,000 Units by mouth daily.     . clopidogrel (PLAVIX) 75 MG tablet TAKE 1 TABLET BY MOUTH ONCE DAILY 30 tablet 11  . Coenzyme Q10 (CO Q 10) 100 MG CAPS Take 1 capsule by mouth daily.     Marland Kitchen doxycycline (VIBRAMYCIN) 100 MG capsule Take 100 mg by mouth 2 (two) times daily.    . ergocalciferol (VITAMIN D2) 1.25 MG (50000 UT) capsule Take 50,000 Units by mouth once a week.    . Insulin Glargine (BASAGLAR KWIKPEN [place]) Inject 70 Units into the skin every evening.    . insulin lispro (HUMALOG) 100 UNIT/ML injection Inject 10 units with breakfast and 16 units with lunch and dinner,    . lisinopril (PRINIVIL,ZESTRIL) 20 MG tablet Take 20 mg by mouth every evening.     . nitroGLYCERIN (NITROSTAT) 0.4 MG SL tablet PLACE 1 TABLET UNDER THE TONGUE EVERY 5 MINUTES AS NEEDED FOR CHEST PAIN 25 tablet 4  . pantoprazole (PROTONIX) 40 MG tablet TAKE 1 TABLET BY MOUTH ONCE DAILY AT  6AM. Please keep upcoming appt in July with Dr. Irish Lack before anymore refills. Thank you 90 tablet 0  . PARoxetine (PAXIL) 20 MG tablet paroxetine 20 mg tablet  TK 1 T PO QD    . FREESTYLE LITE test strip USE TO CHECK BS TWICE DAILY  AND PRN      Results for orders placed or performed during the hospital encounter of 03/11/20 (from the past 48 hour(s))  Glucose,  capillary     Status: Abnormal   Collection Time: 03/11/20  8:05 AM  Result Value Ref Range   Glucose-Capillary 168 (H) 70 - 99 mg/dL    Comment: Glucose reference range applies only to samples taken after fasting for at least 8 hours.   No results found.  Review of Systems no recent fever, chills, nausea, vomiting or changes in her appetite  Blood pressure 121/61, pulse 62, resp. rate 18, height 5\' 8"  (1.727 m), weight 80 kg, SpO2 100 %. Physical Exam  Well-nourished well-developed elderly woman in no apparent distress.  Alert and oriented x4.  Normal mood and affect.  The right foot has a plantar ulcer beneath the first metatarsal head with gangrenous changes extending into the distal  plantar arch.  1+ dorsalis pedis pulse.  Diminished sensibility to light touch at the forefoot dorsally and plantarly.  5 out of 5 strength in plantarflexion and dorsiflexion of the ankle and toes.  Assessment/Plan  Right plantar forefoot diabetic ulcer -to the operating room for transmetatarsal amputation and heel cord lengthening.  The risks and benefits of the alternative treatment options have been discussed in detail.  The patient wishes to proceed with surgery and specifically understands risks of bleeding, infection, nerve damage, blood clots, need for additional surgery, amputation and death.  Wylene Simmer, MD 03/26/2020, 9:00 AM

## 2020-03-11 NOTE — Anesthesia Procedure Notes (Signed)
Anesthesia Regional Block: Popliteal block   Pre-Anesthetic Checklist: ,, timeout performed, Correct Patient, Correct Site, Correct Laterality, Correct Procedure, Correct Position, site marked, Risks and benefits discussed,  Surgical consent,  Pre-op evaluation,  At surgeon's request and post-op pain management  Laterality: Lower and Right  Prep: chloraprep       Needles:  Injection technique: Single-shot  Needle Type: Echogenic Stimulator Needle     Needle Length: 9cm  Needle Gauge: 20   Needle insertion depth: 1.5 cm   Additional Needles:   Procedures:,,,, ultrasound used (permanent image in chart),,,,  Narrative:  Start time: 03/11/2020 8:47 AM End time: 03/11/2020 8:53 AM Injection made incrementally with aspirations every 5 mL.  Performed by: Personally  Anesthesiologist: Lyn Hollingshead, MD

## 2020-03-11 NOTE — Progress Notes (Signed)
CRNA at bedside giving ephedrine for low BP

## 2020-03-11 NOTE — Op Note (Signed)
03/11/2020  10:09 AM  PATIENT:  [name]  86 y.o. female  PRE-OPERATIVE DIAGNOSIS:  right foot diabetic ulcer and short achilles tendon  POST-OPERATIVE DIAGNOSIS:  right foot diabetic ulcer and short achilles tendon  Procedure(s): 1.  Right foot transmetatarsal amputation 2.  Right percutaneous achilles tendon Lengthening  SURGEON:  Wylene Simmer, MD  ASSISTANT: Mechele Claude, PA-C  ANESTHESIA:   General  EBL:  minimal   TOURNIQUET:   Total Tourniquet Time Documented: Calf (Right) - 12 minutes Total: Calf (Right) - 12 minutes  COMPLICATIONS:  None apparent  DISPOSITION:  Extubated, awake and stable to recovery.  INDICATION FOR PROCEDURE: The patient is a an 86 year old female with a past medical history is significant for diabetes complicated by previous forefoot amputations.  She has a nonhealing ulcer of the right first ray.  She has failed nonoperative treatment to date and presents today for transmetatarsal amputation.  Since we saw her last week she has had progression of necrotic changes to the plantar arch.  The risks and benefits of the alternative treatment options have been discussed in detail.  The patient wishes to proceed with surgery and specifically understands risks of bleeding, infection, nerve damage, blood clots, need for additional surgery, amputation and death.  PROCEDURE IN DETAIL:  After pre operative consent was obtained, and the correct operative site was identified, the patient was brought to the operating room and placed supine on the OR table.  Anesthesia was administered.  Pre-operative antibiotics were administered.  A surgical timeout was taken.  The right lower extremity was prepped and draped in standard sterile fashion with a tourniquet around the calf.  The extremity was elevated and the tourniquet was inflated to 250 mmHg.    A percutaneous tendo Achilles lengthening was performed with 3 hemisection cuts with a #15 scalpel.  The ankle were then  dorsiflex 30 degrees with the knee extended.  A fishmouth incision was marked on the skin leaving a longer dorsal flap and including the entire plantar ulcer and the necrotic changes within the plantar arch.  The incision was made and dissection was carried down through the skin to the level of the metatarsals.  Subperiosteal dissection was then carried dorsally and the flap was elevated.  The first TMT joint was disarticulated.  The remaining metatarsals were cut with the oscillating saw beveling the cuts appropriately.  The plantar soft tissues were sharply divided and the forefoot was passed off the field.  Excisional debridement of the plantar medial arch soft tissue was performed.  All necrotic appearing tissue was sharply resected with a scalpel and rondure.  The wound was then irrigated with 3 L of normal saline.  Tourniquet was released and hemostasis was achieved.  The dorsal flap was then used to fill in the plantar defect medially.  The incision was closed with simple and horizontal mattress sutures of 2-0 nylon.  Vancomycin powder had been sprinkled in the wound prior to closure.  Sterile dressings were applied followed by a compression wrap and a cam boot.  The patient was then awakened from anesthesia and transported to the recovery room in stable condition.  FOLLOW UP PLAN: The patient will bear weight as tolerated on her right foot in the cam boot.  She will leave the boot on at all times and follow-up with [place] in a week for a wound check.  She will continue doxycycline 100 mg p.o. twice daily.  She may resume her Plavix on postop day 1.  Mechele Claude PA-C was present and scrubbed for the duration of the operative case. His assistance was essential in positioning the patient, prepping and draping, gaining and maintaining exposure, performing the operation, closing and dressing the wounds and applying the splint.

## 2020-03-11 NOTE — Anesthesia Procedure Notes (Signed)
Date/Time: 03/11/2020 9:07 AM Performed by: Glory Buff, CRNA Oxygen Delivery Method: Simple face mask

## 2020-03-11 NOTE — Progress Notes (Signed)
This note also relates to the following rows which could not be included: [facility] Lab Component - View only - Cannot attach notes to extension rows  CRNA at bedside giving ephedrine for low BP

## 2020-03-11 NOTE — Anesthesia Procedure Notes (Signed)
Anesthesia Regional Block: Adductor canal block   Pre-Anesthetic Checklist: ,, timeout performed, Correct Patient, Correct Site, Correct Laterality, Correct Procedure, Correct Position, site marked, Risks and benefits discussed,  Surgical consent,  Pre-op evaluation,  At surgeon's request and post-op pain management  Laterality: Lower and Right  Prep: chloraprep       Needles:  Injection technique: Single-shot  Needle Type: Echogenic Stimulator Needle     Needle Length: 9cm  Needle Gauge: 20   Needle insertion depth: 2 cm   Additional Needles:   Procedures:,,,, ultrasound used (permanent image in chart),,,,  Narrative:  Start time: 03/11/2020 8:35 AM End time: 03/11/2020 8:45 AM Injection made incrementally with aspirations every 5 mL.  Performed by: Personally  Anesthesiologist: Lyn Hollingshead, MD

## 2020-03-11 NOTE — Anesthesia Preprocedure Evaluation (Addendum)
Anesthesia Evaluation  Patient identified by MRN, date of birth, ID band Patient awake    Reviewed: Allergy & Precautions, NPO status , Patient's Chart, lab work & pertinent test results, reviewed documented beta blocker date and time   History of Anesthesia Complications (+) PONV and history of anesthetic complications  Airway Mallampati: I       Dental  (+) Teeth Intact   Pulmonary former smoker,    Pulmonary exam normal breath sounds clear to auscultation       Cardiovascular hypertension, Pt. on medications and Pt. on home beta blockers + CAD and + Cardiac Stents  Normal cardiovascular exam+ Valvular Problems/Murmurs MR  Rhythm:Regular Rate:Normal     Neuro/Psych  Neuromuscular disease    GI/Hepatic GERD  Medicated and Controlled,  Endo/Other  diabetes, Type 2, Insulin Dependent  Renal/GU   negative genitourinary   Musculoskeletal   Abdominal Normal abdominal exam  (+)   Peds  Hematology negative hematology ROS (+)   Anesthesia Other Findings   Reproductive/Obstetrics                            Anesthesia Physical Anesthesia Plan  ASA: III  Anesthesia Plan: Regional and MAC   Post-op Pain Management:  Regional for Post-op pain   Induction: Intravenous  PONV Risk Score and Plan: Ondansetron and Treatment may vary due to age or medical condition  Airway Management Planned: Simple Face Mask and Natural Airway  Additional Equipment: None  Intra-op Plan:   Post-operative Plan: Extubation in OR  Informed Consent: I have reviewed the patients History and Physical, chart, labs and discussed the procedure including the risks, benefits and alternatives for the proposed anesthesia with the patient or authorized representative who has indicated his/her understanding and acceptance.       Plan Discussed with: CRNA  Anesthesia Plan Comments:        Anesthesia Quick  Evaluation

## 2020-03-11 NOTE — Discharge Instructions (Addendum)
Jeanette Simmer, MD [facility]  Please read the following information regarding your care after surgery.  Medications  You only need a prescription for the narcotic pain medicine (ex. oxycodone, Percocet, Norco).  All of the other medicines listed below are available over the counter. X Aleve 2 pills twice a day for the first 3 days after surgery. X acetominophen (Tylenol) 650 mg every 4-6 hours as you need for minor to moderate pain X Tramadol as prescribed for severe pain X Doxycycline 100 mg twice daily x 2 weeks following surgery   Post Anesthesia Home Care Instructions  Activity: Get plenty of rest for the remainder of the day. A responsible individual must stay with you for 24 hours following the procedure.  For the next 24 hours, DO NOT: -Drive a car -Paediatric nurse -Drink alcoholic beverages -Take any medication unless instructed by your physician -Make any legal decisions or sign important papers.  Meals: Start with liquid foods such as gelatin or soup. Progress to regular foods as tolerated. Avoid greasy, spicy, heavy foods. If nausea and/or vomiting occur, drink only clear liquids until the nausea and/or vomiting subsides. Call your physician if vomiting continues.  Special Instructions/Symptoms: Your throat may feel dry or sore from the anesthesia or the breathing tube placed in your throat during surgery. If this causes discomfort, gargle with warm salt water. The discomfort should disappear within 24 hours.  If you had a scopolamine patch placed behind your ear for the management of post- operative nausea and/or vomiting:  1. The medication in the patch is effective for 72 hours, after which it should be removed.  Wrap patch in a tissue and discard in the trash. Wash hands thoroughly with soap and water. 2. You may remove the patch earlier than 72 hours if you experience unpleasant side effects which may include dry mouth, dizziness or visual disturbances. 3. Avoid  touching the patch. Wash your hands with soap and water after contact with the patch.    Regional Anesthesia Blocks  1. Numbness or the inability to move the "blocked" extremity may last from 3-48 hours after placement. The length of time depends on the medication injected and your individual response to the medication. If the numbness is not going away after 48 hours, call your surgeon.  2. The extremity that is blocked will need to be protected until the numbness is gone and the  Strength has returned. Because you cannot feel it, you will need to take extra care to avoid injury. Because it may be weak, you may have difficulty moving it or using it. You may not know what position it is in without looking at it while the block is in effect.  3. For blocks in the legs and feet, returning to weight bearing and walking needs to be done carefully. You will need to wait until the numbness is entirely gone and the strength has returned. You should be able to move your leg and foot normally before you try and bear weight or walk. You will need someone to be with you when you first try to ensure you do not fall and possibly risk injury.  4. Bruising and tenderness at the needle site are common side effects and will resolve in a few days.  5. Persistent numbness or new problems with movement should be communicated to the surgeon or the [place] [phone] [place] [phone]).  Weight Bearing X Bear weight only on your operated foot in the CAM  boot.   Cast / Splint / Dressing X Keep your splint, cast or dressing clean and dry.  Don't put anything (coat hanger, pencil, etc) down inside of it.  If it gets damp, use a hair dryer on the cool setting to dry it.  If it gets soaked, call the office to schedule an appointment for a cast change.   After your dressing, cast or splint is removed; you may shower, but do not soak or scrub the wound.  Allow the water to run over  it, and then gently pat it dry.  Swelling It is normal for you to have swelling where you had surgery.  To reduce swelling and pain, keep your toes above your nose for at least 3 days after surgery.  It may be necessary to keep your foot or leg elevated for several weeks.  If it hurts, it should be elevated.  Follow Up Call my office at [phone] when you are discharged from the hospital or surgery center to schedule an appointment to be seen two weeks after surgery.  Call my office at [phone] if you develop a fever >101.5 F, nausea, vomiting, bleeding from the surgical site or severe pain.

## 2020-03-11 NOTE — Transfer of Care (Signed)
Immediate Anesthesia Transfer of Care Note  Patient: [name]  Procedure(s) Performed: Right foot transmetatarsal amputation (Right Foot) Heel Cord Lengthening (Right Foot)  Patient Location: PACU  Anesthesia Type:MAC and Regional  Level of Consciousness: drowsy, patient cooperative and responds to stimulation  Airway & Oxygen Therapy: Patient Spontanous Breathing and Patient connected to face mask oxygen  Post-op Assessment: Report given to RN and Post -op Vital signs reviewed and stable  Post vital signs: Reviewed and stable  Last Vitals:  Vitals Value Taken Time  BP    Temp    Pulse 55 03/11/20 1007  Resp 16 03/11/20 1007  SpO2 93 % 03/11/20 1007  Vitals shown include unvalidated device data.  Last Pain:  Vitals:   03/11/20 0813  PainSc: 6       Patients Stated Pain Goal: 6 (33/29/51 8841)  Complications: No complications documented.

## 2020-03-11 NOTE — Progress Notes (Signed)
Assisted Dr. Jillyn Hidden with right, ultrasound guided, popliteal, adductor canal block. Side rails up, monitors on throughout procedure. See vital signs in flow sheet. Tolerated Procedure well.

## 2020-03-11 NOTE — Anesthesia Postprocedure Evaluation (Signed)
Anesthesia Post Note  Patient: [name]  Procedure(s) Performed: Right foot transmetatarsal amputation (Right Foot) Heel Cord Lengthening (Right Foot)     Patient location during evaluation: PACU Anesthesia Type: MAC and Regional Level of consciousness: awake Pain management: pain level controlled Vital Signs Assessment: post-procedure vital signs reviewed and stable Respiratory status: spontaneous breathing Cardiovascular status: stable Postop Assessment: no apparent nausea or vomiting Anesthetic complications: no   No complications documented.  Last Vitals:  Vitals:   03/11/20 1037 03/11/20 1045  BP:  (!) 74/60  Pulse:  (!) 53  Resp: 17 17  Temp:    SpO2: 90% 99%    Last Pain:  Vitals:   03/11/20 1030  PainSc: 0-No pain                 John F Cindy Fullman Jr

## 2020-03-12 ENCOUNTER — Inpatient Hospital Stay (HOSPITAL_COMMUNITY)
Admission: EM | Admit: 2020-03-12 | Discharge: 2020-03-19 | DRG: 239 | Disposition: A | Discharge status: Skilled Nursing Facility | Payer: HMO | Attending: Internal Medicine | Admitting: Internal Medicine

## 2020-03-12 ENCOUNTER — Emergency Department (HOSPITAL_COMMUNITY): Payer: HMO

## 2020-03-12 ENCOUNTER — Other Ambulatory Visit: Payer: Self-pay

## 2020-03-12 ENCOUNTER — Encounter (HOSPITAL_BASED_OUTPATIENT_CLINIC_OR_DEPARTMENT_OTHER): Payer: Self-pay | Admitting: Orthopedic Surgery

## 2020-03-12 DIAGNOSIS — Z9049 Acquired absence of other specified parts of digestive tract: Secondary | ICD-10-CM

## 2020-03-12 DIAGNOSIS — Z8249 Family history of ischemic heart disease and other diseases of the circulatory system: Secondary | ICD-10-CM

## 2020-03-12 DIAGNOSIS — I110 Hypertensive heart disease with heart failure: Secondary | ICD-10-CM | POA: Diagnosis present

## 2020-03-12 DIAGNOSIS — E1159 Type 2 diabetes mellitus with other circulatory complications: Secondary | ICD-10-CM | POA: Diagnosis present

## 2020-03-12 DIAGNOSIS — I739 Peripheral vascular disease, unspecified: Secondary | ICD-10-CM | POA: Diagnosis present

## 2020-03-12 DIAGNOSIS — I447 Left bundle-branch block, unspecified: Secondary | ICD-10-CM | POA: Diagnosis present

## 2020-03-12 DIAGNOSIS — Z7902 Long term (current) use of antithrombotics/antiplatelets: Secondary | ICD-10-CM

## 2020-03-12 DIAGNOSIS — Z882 Allergy status to sulfonamides status: Secondary | ICD-10-CM

## 2020-03-12 DIAGNOSIS — D649 Anemia, unspecified: Secondary | ICD-10-CM | POA: Diagnosis present

## 2020-03-12 DIAGNOSIS — I501 Left ventricular failure: Secondary | ICD-10-CM | POA: Diagnosis present

## 2020-03-12 DIAGNOSIS — I5041 Acute combined systolic (congestive) and diastolic (congestive) heart failure: Principal | ICD-10-CM | POA: Diagnosis present

## 2020-03-12 DIAGNOSIS — E1152 Type 2 diabetes mellitus with diabetic peripheral angiopathy with gangrene: Secondary | ICD-10-CM | POA: Diagnosis present

## 2020-03-12 DIAGNOSIS — K760 Fatty (change of) liver, not elsewhere classified: Secondary | ICD-10-CM | POA: Diagnosis present

## 2020-03-12 DIAGNOSIS — E049 Nontoxic goiter, unspecified: Secondary | ICD-10-CM | POA: Diagnosis present

## 2020-03-12 DIAGNOSIS — E039 Hypothyroidism, unspecified: Secondary | ICD-10-CM | POA: Diagnosis present

## 2020-03-12 DIAGNOSIS — K761 Chronic passive congestion of liver: Secondary | ICD-10-CM | POA: Diagnosis present

## 2020-03-12 DIAGNOSIS — J9811 Atelectasis: Secondary | ICD-10-CM | POA: Diagnosis present

## 2020-03-12 DIAGNOSIS — I10 Essential (primary) hypertension: Secondary | ICD-10-CM | POA: Diagnosis present

## 2020-03-12 DIAGNOSIS — L97519 Non-pressure chronic ulcer of other part of right foot with unspecified severity: Secondary | ICD-10-CM | POA: Diagnosis present

## 2020-03-12 DIAGNOSIS — Z79899 Other long term (current) drug therapy: Secondary | ICD-10-CM

## 2020-03-12 DIAGNOSIS — I2582 Chronic total occlusion of coronary artery: Secondary | ICD-10-CM | POA: Diagnosis present

## 2020-03-12 DIAGNOSIS — M6700 Short Achilles tendon (acquired), unspecified ankle: Secondary | ICD-10-CM | POA: Diagnosis present

## 2020-03-12 DIAGNOSIS — R778 Other specified abnormalities of plasma proteins: Secondary | ICD-10-CM | POA: Diagnosis present

## 2020-03-12 DIAGNOSIS — Y95 Nosocomial condition: Secondary | ICD-10-CM | POA: Diagnosis not present

## 2020-03-12 DIAGNOSIS — E11622 Type 2 diabetes mellitus with other skin ulcer: Secondary | ICD-10-CM | POA: Diagnosis present

## 2020-03-12 DIAGNOSIS — E11621 Type 2 diabetes mellitus with foot ulcer: Secondary | ICD-10-CM | POA: Diagnosis present

## 2020-03-12 DIAGNOSIS — Z9981 Dependence on supplemental oxygen: Secondary | ICD-10-CM

## 2020-03-12 DIAGNOSIS — R7989 Other specified abnormal findings of blood chemistry: Secondary | ICD-10-CM | POA: Diagnosis present

## 2020-03-12 DIAGNOSIS — I959 Hypotension, unspecified: Secondary | ICD-10-CM | POA: Diagnosis present

## 2020-03-12 DIAGNOSIS — Z66 Do not resuscitate: Secondary | ICD-10-CM | POA: Diagnosis not present

## 2020-03-12 DIAGNOSIS — I2510 Atherosclerotic heart disease of native coronary artery without angina pectoris: Secondary | ICD-10-CM | POA: Diagnosis present

## 2020-03-12 DIAGNOSIS — I429 Cardiomyopathy, unspecified: Secondary | ICD-10-CM | POA: Diagnosis present

## 2020-03-12 DIAGNOSIS — K5732 Diverticulitis of large intestine without perforation or abscess without bleeding: Secondary | ICD-10-CM | POA: Diagnosis present

## 2020-03-12 DIAGNOSIS — E782 Mixed hyperlipidemia: Secondary | ICD-10-CM | POA: Diagnosis present

## 2020-03-12 DIAGNOSIS — E1140 Type 2 diabetes mellitus with diabetic neuropathy, unspecified: Secondary | ICD-10-CM | POA: Diagnosis present

## 2020-03-12 DIAGNOSIS — K219 Gastro-esophageal reflux disease without esophagitis: Secondary | ICD-10-CM | POA: Diagnosis present

## 2020-03-12 DIAGNOSIS — I509 Heart failure, unspecified: Secondary | ICD-10-CM

## 2020-03-12 DIAGNOSIS — J9601 Acute respiratory failure with hypoxia: Secondary | ICD-10-CM | POA: Diagnosis present

## 2020-03-12 DIAGNOSIS — Z20822 Contact with and (suspected) exposure to covid-19: Secondary | ICD-10-CM | POA: Diagnosis present

## 2020-03-12 DIAGNOSIS — J189 Pneumonia, unspecified organism: Secondary | ICD-10-CM

## 2020-03-12 DIAGNOSIS — E042 Nontoxic multinodular goiter: Secondary | ICD-10-CM

## 2020-03-12 DIAGNOSIS — Z87891 Personal history of nicotine dependence: Secondary | ICD-10-CM

## 2020-03-12 DIAGNOSIS — J90 Pleural effusion, not elsewhere classified: Secondary | ICD-10-CM

## 2020-03-12 DIAGNOSIS — K314 Gastric diverticulum: Secondary | ICD-10-CM | POA: Diagnosis present

## 2020-03-12 DIAGNOSIS — E118 Type 2 diabetes mellitus with unspecified complications: Secondary | ICD-10-CM | POA: Diagnosis present

## 2020-03-12 DIAGNOSIS — I083 Combined rheumatic disorders of mitral, aortic and tricuspid valves: Secondary | ICD-10-CM | POA: Diagnosis present

## 2020-03-12 DIAGNOSIS — R0600 Dyspnea, unspecified: Secondary | ICD-10-CM

## 2020-03-12 DIAGNOSIS — Z833 Family history of diabetes mellitus: Secondary | ICD-10-CM

## 2020-03-12 DIAGNOSIS — R748 Abnormal levels of other serum enzymes: Secondary | ICD-10-CM | POA: Diagnosis present

## 2020-03-12 DIAGNOSIS — Z794 Long term (current) use of insulin: Secondary | ICD-10-CM

## 2020-03-12 DIAGNOSIS — Z955 Presence of coronary angioplasty implant and graft: Secondary | ICD-10-CM

## 2020-03-12 DIAGNOSIS — F419 Anxiety disorder, unspecified: Secondary | ICD-10-CM | POA: Diagnosis present

## 2020-03-12 DIAGNOSIS — Z89422 Acquired absence of other left toe(s): Secondary | ICD-10-CM

## 2020-03-12 DIAGNOSIS — R0902 Hypoxemia: Secondary | ICD-10-CM

## 2020-03-12 HISTORY — DX: Heart failure, unspecified: I50.9

## 2020-03-12 HISTORY — DX: Peripheral vascular disease, unspecified: I73.9

## 2020-03-12 LAB — COMPREHENSIVE METABOLIC PANEL
ALT: 428 U/L — ABNORMAL HIGH (ref 0–44)
AST: 892 U/L — ABNORMAL HIGH (ref 15–41)
Albumin: 2.5 g/dL — ABNORMAL LOW (ref 3.5–5.0)
Alkaline Phosphatase: 127 U/L — ABNORMAL HIGH (ref 38–126)
Anion gap: 14 (ref 5–15)
BUN: 26 mg/dL — ABNORMAL HIGH (ref 8–23)
CO2: 23 mmol/L (ref 22–32)
Calcium: 8.6 mg/dL — ABNORMAL LOW (ref 8.9–10.3)
Chloride: 102 mmol/L (ref 98–111)
Creatinine, Ser: 0.95 mg/dL (ref 0.44–1.00)
GFR calc Af Amer: >60 mL/min (ref >60)
GFR calc non Af Amer: 55 mL/min — ABNORMAL LOW (ref >60)
Glucose, Bld: 215 mg/dL — ABNORMAL HIGH (ref 70–99)
Potassium: 4.4 mmol/L (ref 3.5–5.1)
Sodium: 139 mmol/L (ref 135–145)
Total Bilirubin: 0.5 mg/dL (ref 0.3–1.2)
Total Protein: 6.7 g/dL (ref 6.5–8.1)

## 2020-03-12 LAB — CBC WITH DIFFERENTIAL/PLATELET
Abs Immature Granulocytes: 0.15 10*3/uL — ABNORMAL HIGH (ref 0.00–0.07)
Basophils Absolute: 0 10*3/uL (ref 0.0–0.1)
Basophils Relative: 0 %
Eosinophils Absolute: 0 10*3/uL (ref 0.0–0.5)
Eosinophils Relative: 0 %
HCT: 27.2 % — ABNORMAL LOW (ref 36.0–46.0)
Hemoglobin: 8.2 g/dL — ABNORMAL LOW (ref 12.0–15.0)
Immature Granulocytes: 1 %
Lymphocytes Relative: 8 %
Lymphs Abs: 1.5 10*3/uL (ref 0.7–4.0)
MCH: 26.4 pg (ref 26.0–34.0)
MCHC: 30.1 g/dL (ref 30.0–36.0)
MCV: 87.5 fL (ref 80.0–100.0)
Monocytes Absolute: 1.1 10*3/uL — ABNORMAL HIGH (ref 0.1–1.0)
Monocytes Relative: 7 %
Neutro Abs: 14.7 10*3/uL — ABNORMAL HIGH (ref 1.7–7.7)
Neutrophils Relative %: 84 %
Platelets: 569 10*3/uL — ABNORMAL HIGH (ref 150–400)
RBC: 3.11 MIL/uL — ABNORMAL LOW (ref 3.87–5.11)
RDW: 15.2 % (ref 11.5–15.5)
WBC: 17.5 10*3/uL — ABNORMAL HIGH (ref 4.0–10.5)
nRBC: 0 % (ref 0.0–0.2)

## 2020-03-12 LAB — TROPONIN I (HIGH SENSITIVITY)
Troponin I (High Sensitivity): 238 ng/L (ref <18)
Troponin I (High Sensitivity): 293 ng/L (ref <18)

## 2020-03-12 LAB — LIPASE, BLOOD: Lipase: 19 U/L (ref 11–51)

## 2020-03-12 LAB — BRAIN NATRIURETIC PEPTIDE: B Natriuretic Peptide: 1584.9 pg/mL — ABNORMAL HIGH (ref 0.0–100.0)

## 2020-03-12 LAB — TSH: TSH: 0.892 u[IU]/mL (ref 0.350–4.500)

## 2020-03-12 LAB — LACTIC ACID, PLASMA: Lactic Acid, Venous: 1.8 mmol/L (ref 0.5–1.9)

## 2020-03-12 MED ORDER — IOHEXOL 350 MG/ML SOLN
100.0000 mL | Freq: Once | INTRAVENOUS | Status: AC | PRN
  Administered 2020-03-12: 100 mL via INTRAVENOUS

## 2020-03-12 MED ORDER — INSULIN ASPART 100 UNIT/ML ~~LOC~~ SOLN: 0.0000 [IU] | Freq: Three times a day (TID) | SUBCUTANEOUS | Status: DC

## 2020-03-12 MED ORDER — FUROSEMIDE 10 MG/ML IJ SOLN
40.0000 mg | Freq: Once | INTRAMUSCULAR | Status: AC
  Administered 2020-03-12: 40 mg via INTRAVENOUS
  Filled 2020-03-12: qty 4

## 2020-03-12 NOTE — ED Notes (Signed)
Patient transported to CTA via stretcher

## 2020-03-12 NOTE — ED Triage Notes (Signed)
Pt reports she began feeling SOB in the middle of the night. Pt states she had half of her Right foot removed yesterday, she remembers hearing after surgery yesterday that her stas were dropping.

## 2020-03-12 NOTE — ED Provider Notes (Signed)
[facility] EMERGENCY DEPARTMENT Provider Note   CSN: [id number] Arrival date & time: 03/12/20  1302     History Chief Complaint  Patient presents with  . Shortness of Breath    [name] is a 86 y.o. female.  Pt presents to the ED today with sob.  Pt had a right transmetatarsal amputation of the right foot yesterday by Dr. Doran Durand because of a nonhealing diabetic foot ulcer.  Pt developed sob during the night and came to the ED this afternoon.  She's been waiting for several hours to come back.  She was placed on 2L oxygen while waiting due to a RA O2 sat of 88%.  Pt denies f/c.  She had a negative Covid swab on 8/23.  She said she remembers hearing her oxygen was low in recovery.  I don't see any documentation regarding a low O2 sat, but pt did have a low bp.  She was given Ephedrine and IVFs.        Past Medical History:  Diagnosis Date  . Anxiety   . CAD (coronary artery disease)   . Cellulitis 10/2015   LEFT FOOT  . Complication of anesthesia   . Coronary artery disease   . Diabetes mellitus without complication (HCC)    insulin dependent  . GERD (gastroesophageal reflux disease)   . Hypertension   . Hypothyroidism   . Neuromuscular disorder (HCC)    muscle cramps to lower extremities  . Other primary cardiomyopathies   . PONV (postoperative nausea and vomiting)   . Shortness of breath     Patient Active Problem List   Diagnosis Date Noted  . Achilles tendon contracture, left 07/24/2016  . Ulcer of left foot, limited to breakdown of skin ([place]) 06/01/2016  . Cellulitis 10/27/2015  . Peripheral arterial disease ([place]) 10/20/2015  . Severe peripheral arterial disease ([place]) 12/28/2014  . Chronic diastolic heart failure ([place]) 12/30/2013  . Edema 12/30/2013  . Mixed hyperlipidemia 06/24/2013  . Mitral valve disorders(424.0) 06/24/2013  . Coronary artery disease   . Hypertension   . Diabetes mellitus without complication ([place])   . Other primary  cardiomyopathies     Past Surgical History:  Procedure Laterality Date  . ABDOMINAL AORTOGRAM N/A 09/03/2018   Procedure: ABDOMINAL AORTOGRAM;  Surgeon: Serafina Mitchell, MD;  Location: [facility];  Service: Cardiovascular;  Laterality: N/A;  . ABDOMINAL AORTOGRAM W/LOWER EXTREMITY N/A 04/16/2018   Procedure: ABDOMINAL AORTOGRAM W/LOWER EXTREMITY;  Surgeon: Serafina Mitchell, MD;  Location: [facility];  Service: Cardiovascular;  Laterality: N/A;  unilateral  . ABDOMINAL HYSTERECTOMY    . AMPUTATION Left 12/03/2018   Procedure: Left 3rd ray amputation;  Surgeon: Wylene Simmer, MD;  Location: [facility];  Service: Orthopedics;  Laterality: Left;  62min  . CHOLECYSTECTOMY    . CORONARY ANGIOPLASTY WITH STENT PLACEMENT  08/09/2011   DES  to mid circumflex  . I & D EXTREMITY Left 10/29/2015   Procedure: Irrigation and Debridement Left Foot;  Surgeon: Newt Minion, MD;  Location: [facility];  Service: Orthopedics;  Laterality: Left;  . LEFT HEART CATHETERIZATION WITH CORONARY ANGIOGRAM N/A 08/08/2012   Procedure: LEFT HEART CATHETERIZATION WITH CORONARY ANGIOGRAM;  Surgeon: [name], MD;  Location: [facility];  Service: Cardiovascular;  Laterality: N/A;  . PERIPHERAL VASCULAR ATHERECTOMY  04/16/2018   Procedure: PERIPHERAL VASCULAR ATHERECTOMY;  Surgeon: Serafina Mitchell, MD;  Location: [facility];  Service: Cardiovascular;;  lt. Peroneal  . PERIPHERAL VASCULAR BALLOON ANGIOPLASTY  04/16/2018   Procedure: PERIPHERAL VASCULAR BALLOON ANGIOPLASTY;  Surgeon: Serafina Mitchell, MD;  Location: [facility];  Service: Cardiovascular;;  lt. sfa and AT  . PERIPHERAL VASCULAR BALLOON ANGIOPLASTY Left 09/03/2018   Procedure: PERIPHERAL VASCULAR BALLOON ANGIOPLASTY;  Surgeon: Serafina Mitchell, MD;  Location: [facility];  Service: Cardiovascular;  Laterality: Left;  TP TRUNK  . PERIPHERAL VASCULAR CATHETERIZATION N/A 12/30/2014   Procedure: Lower Extremity  Angiography;  Surgeon: Wellington Hampshire, MD;  Location: [facility];  Service: Cardiovascular;  Laterality: N/A;  . PERIPHERAL VASCULAR INTERVENTION Left 09/03/2018   Procedure: PERIPHERAL VASCULAR INTERVENTION;  Surgeon: Serafina Mitchell, MD;  Location: [facility];  Service: Cardiovascular;  Laterality: Left;  SFA STENT   . TENDON RELEASE Right 03/11/2020   Procedure: Heel Cord Lengthening;  Surgeon: Wylene Simmer, MD;  Location: [facility];  Service: Orthopedics;  Laterality: Right;  . THYROID SURGERY     radioactive iodine   . TONSILLECTOMY    . TRANSMETATARSAL AMPUTATION Right 03/11/2020   Procedure: Right foot transmetatarsal amputation;  Surgeon: Wylene Simmer, MD;  Location: [facility];  Service: Orthopedics;  Laterality: Right;     OB History   No obstetric history on file.     Family History  Problem Relation Age of Onset  . Heart disease Father   . Heart attack Father   . Diabetes Sister   . Heart disease Son        before age 8  . Heart attack 96        84yr old  . Sudden death Grandchild   . Hypertension Neg Hx     Social History   Tobacco Use  . Smoking status: Former Smoker    Quit date: 11/28/1960    Years since quitting: 59.3  . Smokeless tobacco: Never Used  Vaping Use  . Vaping Use: Never used  Substance Use Topics  . Alcohol use: Yes    Alcohol/week: 0.0 standard drinks    Comment: rare  . Drug use: No    Home Medications Prior to Admission medications   Medication Sig Start Date End Date Taking? Authorizing Provider  amLODipine (NORVASC) 10 MG tablet TAKE 1 TABLET BY MOUTH ONCE DAILY 02/27/20  Yes [name], MD  aspirin EC 325 MG tablet Take 325 mg by mouth at bedtime.    Yes [provider]  atorvastatin (LIPITOR) 20 MG tablet Take 1 tablet (20 mg total) by mouth daily. Please keep upcoming appt in July with Dr. Irish Lack before anymore refills. Thank you 01/05/20  Yes [name], MD  carvedilol (COREG) 25 MG tablet Take 1 tablet (25 mg total) by mouth 2 (two) times daily with a meal. *Please call and schedule an appointment with Dr Fletcher Anon* 07/18/16  Yes Wellington Hampshire, MD  cholecalciferol (VITAMIN D) 1000 units tablet Take 2,000 Units by mouth daily.    Yes [provider]  Coenzyme Q10 (CO Q 10) 100 MG CAPS Take 1 capsule by mouth daily.    Yes [provider]  doxycycline (ADOXA) 100 MG tablet Take 1 tablet (100 mg total) by mouth 2 (two) times daily for 14 days. 03/11/20 03/25/20 Yes Corky Sing, PA-C  ergocalciferol (VITAMIN D2) 1.25 MG (50000 UT) capsule Take 50,000 Units by mouth once a week.   Yes [provider]  Insulin Glargine (BASAGLAR KWIKPEN [place])  Inject 70 Units into the skin every evening.   Yes [provider]  insulin lispro (HUMALOG) 100 UNIT/ML injection Inject 10 units with breakfast and 16 units with lunch and dinner,   Yes [provider]  lisinopril (PRINIVIL,ZESTRIL) 20 MG tablet Take 20 mg by mouth every evening.  10/29/15  Yes [provider]  nitroGLYCERIN (NITROSTAT) 0.4 MG SL tablet PLACE 1 TABLET UNDER THE TONGUE EVERY 5 MINUTES AS NEEDED FOR CHEST PAIN 09/19/19  Yes [name], MD  pantoprazole (PROTONIX) 40 MG tablet TAKE 1 TABLET BY MOUTH ONCE DAILY AT  6AM. Please keep upcoming appt in July with Dr. Irish Lack before anymore refills. Thank you Patient taking differently: Take 40 mg by mouth daily as needed (heartburn). TAKE 1 TABLET BY MOUTH ONCE DAILY AT  6AM. Please keep upcoming appt in July with Dr. Irish Lack before anymore refills. Thank you 01/02/20  Yes [name], MD  PARoxetine (PAXIL) 20 MG tablet Take 20 mg by mouth daily.    Yes [provider]  traMADol (ULTRAM) 50 MG tablet Take 1 tablet (50 mg total) by mouth every 6 (six) hours as needed. Patient taking differently: Take 50 mg by mouth every 6 (six) hours as needed for moderate pain or  severe pain.  03/11/20 03/11/21 Yes Corky Sing, PA-C  clopidogrel (PLAVIX) 75 MG tablet TAKE 1 TABLET BY MOUTH ONCE DAILY 05/04/19   Serafina Mitchell, MD  FREESTYLE LITE test strip USE TO CHECK BS TWICE DAILY  AND PRN 02/10/19   [provider]    Allergies    Sulfa antibiotics  Review of Systems   Review of Systems  Respiratory: Positive for shortness of breath.   All other systems reviewed and are negative.   Physical Exam Updated Vital Signs BP (!) 147/59   Pulse 74   Temp 98.4 F (36.9 C) (Oral)   Resp (!) 24   SpO2 95%   Physical Exam Vitals and nursing note reviewed.  Constitutional:      Appearance: She is well-developed.  HENT:     Head: Normocephalic and atraumatic.     Mouth/Throat:     Mouth: Mucous membranes are moist.     Pharynx: Oropharynx is clear.  Eyes:     Extraocular Movements: Extraocular movements intact.     Pupils: Pupils are equal, round, and reactive to light.  Cardiovascular:     Rate and Rhythm: Normal rate and regular rhythm.  Pulmonary:     Effort: Tachypnea present.     Breath sounds: Rhonchi present.  Abdominal:     General: Bowel sounds are normal.     Palpations: Abdomen is soft.  Musculoskeletal:     Cervical back: Normal range of motion and neck supple.     Comments: Right foot wrapped in a splint.  This was not removed.  Skin:    General: Skin is warm and dry.     Capillary Refill: Capillary refill takes less than 2 seconds.  Neurological:     General: No focal deficit present.     Mental Status: She is alert.  Psychiatric:        Mood and Affect: Mood normal.        Behavior: Behavior normal.     ED Results / Procedures / Treatments   Labs (all labs ordered are listed, but only abnormal results are displayed) Labs Reviewed  CBC WITH DIFFERENTIAL/PLATELET - Abnormal; Notable for the following components:  Result Value   WBC 17.5 (*)    RBC 3.11 (*)    Hemoglobin 8.2 (*)    HCT 27.2 (*)     Platelets 569 (*)    Neutro Abs 14.7 (*)    Monocytes Absolute 1.1 (*)    Abs Immature Granulocytes 0.15 (*)    All other components within normal limits  COMPREHENSIVE METABOLIC PANEL - Abnormal; Notable for the following components:   Glucose, Bld 215 (*)    BUN 26 (*)    Calcium 8.6 (*)    Albumin 2.5 (*)    AST 892 (*)    ALT 428 (*)    Alkaline Phosphatase 127 (*)    GFR calc non Af Amer 55 (*)    All other components within normal limits  BRAIN NATRIURETIC PEPTIDE - Abnormal; Notable for the following components:   B Natriuretic Peptide 1,584.9 (*)    All other components within normal limits  TROPONIN I (HIGH SENSITIVITY) - Abnormal; Notable for the following components:   Troponin I (High Sensitivity) 238 (*)    All other components within normal limits  SARS CORONAVIRUS 2 BY RT PCR (HOSPITAL ORDER, [facility])  LACTIC ACID, PLASMA  LIPASE, BLOOD  TSH  LACTIC ACID, PLASMA  TROPONIN I (HIGH SENSITIVITY)    EKG EKG Interpretation  Date/Time:  Friday March 12 2020 13:07:48 EDT Ventricular Rate:  75 PR Interval:  164 QRS Duration: 134 QT Interval:  452 QTC Calculation: 504 R Axis:   -14 Text Interpretation: Normal sinus rhythm Left ventricular hypertrophy with QRS widening and repolarization abnormality ( R in aVL , Cornell product ) Abnormal ECG No significant change since last tracing Confirmed by Isla Pence [phone]) on 03/12/2020 8:01:33 PM   Radiology DG Chest 2 View  Result Date: 03/12/2020 CLINICAL DATA:  Short of breath EXAM: CHEST - 2 VIEW COMPARISON:  CT chest 12/14/2016 FINDINGS: Pulmonary vascular congestion with increased interstitial markings compatible with edema. Small bilateral effusions and bibasilar atelectasis Right paratracheal soft tissue mass corresponding to substernal goiter as noted on CT. IMPRESSION: Congestive heart failure with mild interstitial edema and bilateral pleural effusions. Bibasilar atelectasis  Substernal goiter in the right paratracheal region. Electronically Signed   By: Franchot Gallo M.D.   On: 03/12/2020 15:16   CT Angio Chest PE W and/or Wo Contrast  Result Date: 03/12/2020 CLINICAL DATA:  Shortness of breath transmetatarsal amputation yesterday. EXAM: CT ANGIOGRAPHY CHEST WITH CONTRAST TECHNIQUE: Multidetector CT imaging of the chest was performed using the standard protocol during bolus administration of intravenous contrast. Multiplanar CT image reconstructions and MIPs were obtained to evaluate the vascular anatomy. Performed in conjunction with CT of the abdomen/pelvis, reported separately. CONTRAST:  123mL OMNIPAQUE IOHEXOL 350 MG/ML SOLN COMPARISON:  Radiograph earlier today. FINDINGS: Cardiovascular: There are no filling defects within the pulmonary arteries to suggest pulmonary embolus. Aortic atherosclerosis without aortic aneurysm. Mild multi chamber cardiomegaly. Coronary artery calcifications. No pericardial effusion. Mediastinum/Nodes: Thyroid goiter with substernal extension on the right causing mild leftward mass effect on the trachea and esophagus. This is stable from prior exams. This has been evaluated on previous imaging. (ref: J Am Coll Radiol. 2015 Feb;12(2): 143-50).Prior thyroid ultrasound 09/13/2017 multiple small mediastinal lymph nodes are not enlarged by size criteria. No esophageal wall thickening. Lungs/Pleura: Moderate bilateral pleural effusions with compressive atelectasis. Mild smooth septal thickening consistent with pulmonary edema. No pulmonary mass or suspicious nodule. Upper Abdomen: Assessed on concurrent abdominal CT, reported separately. Musculoskeletal: Scoliosis  and multilevel degenerative change in the spine. There are no acute or suspicious osseous abnormalities. Review of the MIP images confirms the above findings. IMPRESSION: 1. No pulmonary embolus. 2. CHF. Moderate bilateral pleural effusions with compressive atelectasis. Mild pulmonary edema. 3.  Thyroid goiter with substernal extension on the right causing mild leftward mass effect on the trachea and esophagus. This is stable from prior exams. This has been evaluated on previous imaging. (ref: J Am Coll Radiol. 2015 Feb;12(2): 143-50). Aortic Atherosclerosis (ICD10-I70.0). Electronically Signed   By: Keith Rake M.D.   On: 03/12/2020 21:47   CT ABDOMEN PELVIS W CONTRAST  Result Date: 03/12/2020 CLINICAL DATA:  Abdominal pain.  Elevated LFTs. EXAM: CT ABDOMEN AND PELVIS WITH CONTRAST TECHNIQUE: Multidetector CT imaging of the abdomen and pelvis was performed using the standard protocol following bolus administration of intravenous contrast. Performed in conjunction with CT of the chest, reported separately. CONTRAST:  148mL OMNIPAQUE IOHEXOL 350 MG/ML SOLN COMPARISON:  Included portion from chest CT 11/26/2016. FINDINGS: Lower chest: Assessed on concurrent chest CTA. Moderate pleural effusions and compressive atelectasis. Hepatobiliary: No focal liver abnormality is seen. Diffusely decreased hepatic density consistent with steatosis. Status post cholecystectomy. No biliary dilatation. Pancreas: Parenchymal atrophy. No ductal dilatation or inflammation. Minimal calcifications in the pancreatic head. Spleen: Normal in size without focal abnormality. Adrenals/Urinary Tract: Normal adrenal glands. No hydronephrosis. There are lobulated renal contours, left greater than right. Homogeneous renal enhancement with symmetric excretion on delayed phase imaging. Subcentimeter low-density in the lower right kidney is too small to characterize but likely small cyst. Urinary bladder is physiologically distended without wall thickening. Stomach/Bowel: There is a posterior gastric diverticulum that is unchanged from prior chest CT 12/14/2016. This contains intraluminal fluid. No adjacent fat stranding. Stomach is otherwise decompressed. Small duodenal diverticulum arising from the fourth portion. No inflammatory  change. Small bowel is unremarkable without obstruction, inflammation, or obvious wall thickening. Lipomatous hypertrophy of the ileocecal valve versus small cecal lipoma, nonobstructing. Appendix not well visualized. No appendicitis. Multifocal and diffuse diverticulosis including the right colon where there is mild fat stranding adjacent to a diverticulum in the ascending colon, series 5, image 47/48. No other colonic inflammation. No colonic wall thickening. Vascular/Lymphatic: Aortic atherosclerosis. No aortic aneurysm. Patent portal vein. Left mesenteric calcification consistent with calcified lymph nodes. No enlarged lymph nodes in the abdomen or pelvis. Reproductive: Status post hysterectomy. No adnexal masses. Other: Trace free fluid in the pelvis. No upper abdominal ascites. No free air. No intra-abdominal abscess. Tiny fat containing umbilical hernia. Musculoskeletal: Diffuse degenerative change in the spine with scoliosis. Mild L5 compression fracture that appears chronic. Degenerative change of the hips and pubic symphysis. No focal bone lesion. IMPRESSION: 1. Mild acute uncomplicated diverticulitis of the ascending colon. 2. Advanced multifocal colonic diverticular disease throughout the entire colon. 3. Hepatic steatosis. 4. Posterior gastric diverticulum, unchanged from prior chest CT 12/14/2016. This contains intraluminal fluid. No adjacent fat stranding. 5. Additional chronic findings as described. Aortic Atherosclerosis (ICD10-I70.0). Electronically Signed   By: Keith Rake M.D.   On: 03/12/2020 21:55    Procedures Procedures (including critical care time)  Medications Ordered in ED Medications  furosemide (LASIX) injection 40 mg (40 mg Intravenous Given 03/12/20 2218)  iohexol (OMNIPAQUE) 350 MG/ML injection 100 mL (100 mLs Intravenous Contrast Given 03/12/20 2139)    ED Course  I have reviewed the triage vital signs and the nursing notes.  Pertinent labs & imaging results that  were available during my care of the patient  were reviewed by me and considered in my medical decision making (see chart for details).    MDM Rules/Calculators/A&P                          PT placed on 2L oxygen and this has helped her O2 sats stay above mid-90s.  She does have CHF on CT scan.  No PE.  LFTs are elevated, likely due to fluid overload.  She has diverticulitis, but no abdominal pain.  She does have anemia.  ? Dilutional.  Pt given lasix IV.  Troponin is elevated, but no cp.  EKG looks unchanged.  Pt d/w cards.  No emergent cath needed.  IV lasix.   ECHO for tomorrow.  After results come back, then put in for an official consult.  Pt d/w Dr. Cyd Silence (triad) for admission.  CRITICAL CARE Performed by: Isla Pence   Total critical care time: 30 minutes  Critical care time was exclusive of separately billable procedures and treating other patients.  Critical care was necessary to treat or prevent imminent or life-threatening deterioration.  Critical care was time spent personally by me on the following activities: development of treatment plan with patient and/or surrogate as well as nursing, discussions with consultants, evaluation of patient's response to treatment, examination of patient, obtaining history from patient or surrogate, ordering and performing treatments and interventions, ordering and review of laboratory studies, ordering and review of radiographic studies, pulse oximetry and re-evaluation of patient's condition.   Final Clinical Impression(s) / ED Diagnoses Final diagnoses:  Acute respiratory failure with hypoxia (HCC)  Acute on chronic congestive heart failure, unspecified heart failure type (HCC)  Chronic bilateral pleural effusions  Anemia, unspecified type  Goiter  Elevated LFTs    Rx / DC Orders ED Discharge Orders    None       Isla Pence, MD 03/12/20 2235

## 2020-03-12 NOTE — Telephone Encounter (Signed)
Document faxed by Davy Pique in Medical records on 03/10/2020.

## 2020-03-13 ENCOUNTER — Observation Stay (HOSPITAL_COMMUNITY): Payer: HMO

## 2020-03-13 ENCOUNTER — Encounter (HOSPITAL_COMMUNITY): Payer: Self-pay | Admitting: Internal Medicine

## 2020-03-13 DIAGNOSIS — J9601 Acute respiratory failure with hypoxia: Secondary | ICD-10-CM | POA: Diagnosis present

## 2020-03-13 DIAGNOSIS — E11621 Type 2 diabetes mellitus with foot ulcer: Secondary | ICD-10-CM

## 2020-03-13 DIAGNOSIS — I083 Combined rheumatic disorders of mitral, aortic and tricuspid valves: Secondary | ICD-10-CM | POA: Diagnosis present

## 2020-03-13 DIAGNOSIS — I25119 Atherosclerotic heart disease of native coronary artery with unspecified angina pectoris: Secondary | ICD-10-CM | POA: Diagnosis not present

## 2020-03-13 DIAGNOSIS — J9811 Atelectasis: Secondary | ICD-10-CM | POA: Diagnosis present

## 2020-03-13 DIAGNOSIS — Z20822 Contact with and (suspected) exposure to covid-19: Secondary | ICD-10-CM | POA: Diagnosis present

## 2020-03-13 DIAGNOSIS — Z882 Allergy status to sulfonamides status: Secondary | ICD-10-CM | POA: Diagnosis not present

## 2020-03-13 DIAGNOSIS — E039 Hypothyroidism, unspecified: Secondary | ICD-10-CM | POA: Diagnosis present

## 2020-03-13 DIAGNOSIS — K5732 Diverticulitis of large intestine without perforation or abscess without bleeding: Secondary | ICD-10-CM | POA: Diagnosis present

## 2020-03-13 DIAGNOSIS — Y95 Nosocomial condition: Secondary | ICD-10-CM | POA: Diagnosis not present

## 2020-03-13 DIAGNOSIS — K219 Gastro-esophageal reflux disease without esophagitis: Secondary | ICD-10-CM | POA: Diagnosis present

## 2020-03-13 DIAGNOSIS — R0602 Shortness of breath: Secondary | ICD-10-CM | POA: Diagnosis present

## 2020-03-13 DIAGNOSIS — I429 Cardiomyopathy, unspecified: Secondary | ICD-10-CM | POA: Diagnosis present

## 2020-03-13 DIAGNOSIS — I509 Heart failure, unspecified: Secondary | ICD-10-CM

## 2020-03-13 DIAGNOSIS — R778 Other specified abnormalities of plasma proteins: Secondary | ICD-10-CM

## 2020-03-13 DIAGNOSIS — I110 Hypertensive heart disease with heart failure: Secondary | ICD-10-CM | POA: Diagnosis present

## 2020-03-13 DIAGNOSIS — D649 Anemia, unspecified: Secondary | ICD-10-CM | POA: Diagnosis present

## 2020-03-13 DIAGNOSIS — Z79899 Other long term (current) drug therapy: Secondary | ICD-10-CM | POA: Diagnosis not present

## 2020-03-13 DIAGNOSIS — Z8249 Family history of ischemic heart disease and other diseases of the circulatory system: Secondary | ICD-10-CM | POA: Diagnosis not present

## 2020-03-13 DIAGNOSIS — I739 Peripheral vascular disease, unspecified: Secondary | ICD-10-CM

## 2020-03-13 DIAGNOSIS — Z66 Do not resuscitate: Secondary | ICD-10-CM | POA: Diagnosis not present

## 2020-03-13 DIAGNOSIS — Z87891 Personal history of nicotine dependence: Secondary | ICD-10-CM | POA: Diagnosis not present

## 2020-03-13 DIAGNOSIS — R748 Abnormal levels of other serum enzymes: Secondary | ICD-10-CM | POA: Diagnosis present

## 2020-03-13 DIAGNOSIS — I2510 Atherosclerotic heart disease of native coronary artery without angina pectoris: Secondary | ICD-10-CM | POA: Diagnosis present

## 2020-03-13 DIAGNOSIS — E118 Type 2 diabetes mellitus with unspecified complications: Secondary | ICD-10-CM | POA: Diagnosis not present

## 2020-03-13 DIAGNOSIS — R7989 Other specified abnormal findings of blood chemistry: Secondary | ICD-10-CM | POA: Diagnosis present

## 2020-03-13 DIAGNOSIS — I5041 Acute combined systolic (congestive) and diastolic (congestive) heart failure: Secondary | ICD-10-CM | POA: Diagnosis present

## 2020-03-13 DIAGNOSIS — Z9049 Acquired absence of other specified parts of digestive tract: Secondary | ICD-10-CM | POA: Diagnosis not present

## 2020-03-13 DIAGNOSIS — E1140 Type 2 diabetes mellitus with diabetic neuropathy, unspecified: Secondary | ICD-10-CM | POA: Diagnosis present

## 2020-03-13 DIAGNOSIS — Z833 Family history of diabetes mellitus: Secondary | ICD-10-CM | POA: Diagnosis not present

## 2020-03-13 DIAGNOSIS — I501 Left ventricular failure: Secondary | ICD-10-CM | POA: Diagnosis not present

## 2020-03-13 DIAGNOSIS — L97519 Non-pressure chronic ulcer of other part of right foot with unspecified severity: Secondary | ICD-10-CM

## 2020-03-13 DIAGNOSIS — F419 Anxiety disorder, unspecified: Secondary | ICD-10-CM | POA: Diagnosis present

## 2020-03-13 DIAGNOSIS — E1152 Type 2 diabetes mellitus with diabetic peripheral angiopathy with gangrene: Secondary | ICD-10-CM | POA: Diagnosis present

## 2020-03-13 DIAGNOSIS — I5021 Acute systolic (congestive) heart failure: Secondary | ICD-10-CM | POA: Diagnosis not present

## 2020-03-13 DIAGNOSIS — E782 Mixed hyperlipidemia: Secondary | ICD-10-CM

## 2020-03-13 DIAGNOSIS — E049 Nontoxic goiter, unspecified: Secondary | ICD-10-CM

## 2020-03-13 DIAGNOSIS — I5030 Unspecified diastolic (congestive) heart failure: Secondary | ICD-10-CM

## 2020-03-13 DIAGNOSIS — J181 Lobar pneumonia, unspecified organism: Secondary | ICD-10-CM | POA: Diagnosis not present

## 2020-03-13 DIAGNOSIS — Z7902 Long term (current) use of antithrombotics/antiplatelets: Secondary | ICD-10-CM | POA: Diagnosis not present

## 2020-03-13 DIAGNOSIS — J189 Pneumonia, unspecified organism: Secondary | ICD-10-CM | POA: Diagnosis not present

## 2020-03-13 DIAGNOSIS — I10 Essential (primary) hypertension: Secondary | ICD-10-CM

## 2020-03-13 DIAGNOSIS — Z89422 Acquired absence of other left toe(s): Secondary | ICD-10-CM | POA: Diagnosis not present

## 2020-03-13 LAB — COMPREHENSIVE METABOLIC PANEL
ALT: 304 U/L — ABNORMAL HIGH (ref 0–44)
AST: 280 U/L — ABNORMAL HIGH (ref 15–41)
Albumin: 2.5 g/dL — ABNORMAL LOW (ref 3.5–5.0)
Alkaline Phosphatase: 143 U/L — ABNORMAL HIGH (ref 38–126)
Anion gap: 13 (ref 5–15)
BUN: 22 mg/dL (ref 8–23)
CO2: 27 mmol/L (ref 22–32)
Calcium: 9.1 mg/dL (ref 8.9–10.3)
Chloride: 101 mmol/L (ref 98–111)
Creatinine, Ser: 0.66 mg/dL (ref 0.44–1.00)
GFR calc Af Amer: >60 mL/min (ref >60)
GFR calc non Af Amer: >60 mL/min (ref >60)
Glucose, Bld: 176 mg/dL — ABNORMAL HIGH (ref 70–99)
Potassium: 4 mmol/L (ref 3.5–5.1)
Sodium: 141 mmol/L (ref 135–145)
Total Bilirubin: 0.5 mg/dL (ref 0.3–1.2)
Total Protein: 6.9 g/dL (ref 6.5–8.1)

## 2020-03-13 LAB — CBC WITH DIFFERENTIAL/PLATELET
Abs Immature Granulocytes: 0.2 10*3/uL — ABNORMAL HIGH (ref 0.00–0.07)
Basophils Absolute: 0 10*3/uL (ref 0.0–0.1)
Basophils Relative: 0 %
Eosinophils Absolute: 0.1 10*3/uL (ref 0.0–0.5)
Eosinophils Relative: 1 %
HCT: 28.6 % — ABNORMAL LOW (ref 36.0–46.0)
Hemoglobin: 8.8 g/dL — ABNORMAL LOW (ref 12.0–15.0)
Immature Granulocytes: 1 %
Lymphocytes Relative: 10 %
Lymphs Abs: 1.6 10*3/uL (ref 0.7–4.0)
MCH: 26.9 pg (ref 26.0–34.0)
MCHC: 30.8 g/dL (ref 30.0–36.0)
MCV: 87.5 fL (ref 80.0–100.0)
Monocytes Absolute: 0.9 10*3/uL (ref 0.1–1.0)
Monocytes Relative: 6 %
Neutro Abs: 12.5 10*3/uL — ABNORMAL HIGH (ref 1.7–7.7)
Neutrophils Relative %: 82 %
Platelets: 589 10*3/uL — ABNORMAL HIGH (ref 150–400)
RBC: 3.27 MIL/uL — ABNORMAL LOW (ref 3.87–5.11)
RDW: 15 % (ref 11.5–15.5)
WBC: 15.4 10*3/uL — ABNORMAL HIGH (ref 4.0–10.5)
nRBC: 0 % (ref 0.0–0.2)

## 2020-03-13 LAB — GLUCOSE, CAPILLARY
Glucose-Capillary: 67 mg/dL — ABNORMAL LOW (ref 70–99)
Glucose-Capillary: 134 mg/dL — ABNORMAL HIGH (ref 70–99)
Glucose-Capillary: 160 mg/dL — ABNORMAL HIGH (ref 70–99)

## 2020-03-13 LAB — CBG MONITORING, ED
Glucose-Capillary: 169 mg/dL — ABNORMAL HIGH (ref 70–99)
Glucose-Capillary: 142 mg/dL — ABNORMAL HIGH (ref 70–99)
Glucose-Capillary: 57 mg/dL — ABNORMAL LOW (ref 70–99)
Glucose-Capillary: 68 mg/dL — ABNORMAL LOW (ref 70–99)

## 2020-03-13 LAB — FOLATE: Folate: 16.4 ng/mL (ref >5.9)

## 2020-03-13 LAB — RETICULOCYTES
Immature Retic Fract: 24.5 % — ABNORMAL HIGH (ref 2.3–15.9)
RBC.: 3.1 MIL/uL — ABNORMAL LOW (ref 3.87–5.11)
Retic Count, Absolute: 55.8 10*3/uL (ref 19.0–186.0)
Retic Ct Pct: 1.8 % (ref 0.4–3.1)

## 2020-03-13 LAB — HEPATITIS PANEL, ACUTE
HCV Ab: NONREACTIVE
Hep A IgM: NONREACTIVE
Hep B C IgM: NONREACTIVE
Hepatitis B Surface Ag: NONREACTIVE

## 2020-03-13 LAB — ECHOCARDIOGRAM COMPLETE
AR max vel: 2.15 cm2
AV Area VTI: 2.2 cm2
AV Area mean vel: 1.94 cm2
AV Mean grad: 4.4 mmHg
AV Peak grad: 9 mmHg
Ao pk vel: 1.5 m/s
Area-P 1/2: 3.89 cm2
Calc EF: 43.1 %
MV M vel: 5.62 m/s
MV Peak grad: 126.3 mmHg
MV VTI: 1.5 cm2
Radius: 0.2 cm
S' Lateral: 3.6 cm
Single Plane A2C EF: 42.4 %
Single Plane A4C EF: 44 %

## 2020-03-13 LAB — APTT: aPTT: 30 seconds (ref 24–36)

## 2020-03-13 LAB — TROPONIN I (HIGH SENSITIVITY): Troponin I (High Sensitivity): 224 ng/L (ref <18)

## 2020-03-13 LAB — IRON AND TIBC
Iron: 23 ug/dL — ABNORMAL LOW (ref 28–170)
Saturation Ratios: 12 % (ref 10.4–31.8)
TIBC: 188 ug/dL — ABNORMAL LOW (ref 250–450)
UIBC: 165 ug/dL

## 2020-03-13 LAB — VITAMIN B12: Vitamin B-12: 757 pg/mL (ref 180–914)

## 2020-03-13 LAB — SARS CORONAVIRUS 2 BY RT PCR (HOSPITAL ORDER, PERFORMED IN ~~LOC~~ HOSPITAL LAB): SARS Coronavirus 2: NEGATIVE

## 2020-03-13 LAB — MAGNESIUM: Magnesium: 1.7 mg/dL (ref 1.7–2.4)

## 2020-03-13 LAB — PROTIME-INR
INR: 1.3 — ABNORMAL HIGH (ref 0.8–1.2)
Prothrombin Time: 15.8 seconds — ABNORMAL HIGH (ref 11.4–15.2)

## 2020-03-13 LAB — HEMOGLOBIN A1C
Hgb A1c MFr Bld: 8.3 % — ABNORMAL HIGH (ref 4.8–5.6)
Mean Plasma Glucose: 191.51 mg/dL

## 2020-03-13 MED ORDER — INSULIN GLARGINE 100 UNIT/ML ~~LOC~~ SOLN
35.0000 [IU] | Freq: Every evening | SUBCUTANEOUS | Status: DC
  Administered 2020-03-13 – 2020-03-18 (×6): 35 [IU] via SUBCUTANEOUS
  Filled 2020-03-13 (×8): qty 0.35

## 2020-03-13 MED ORDER — INSULIN ASPART 100 UNIT/ML ~~LOC~~ SOLN
10.0000 [IU] | Freq: Three times a day (TID) | SUBCUTANEOUS | Status: DC
  Administered 2020-03-13 – 2020-03-19 (×12): 10 [IU] via SUBCUTANEOUS

## 2020-03-13 MED ORDER — SODIUM CHLORIDE 0.9% FLUSH: 3.0000 mL | INTRAVENOUS | Status: DC | PRN

## 2020-03-13 MED ORDER — ACETAMINOPHEN 325 MG PO TABS
650.0000 mg | ORAL_TABLET | ORAL | Status: DC | PRN
  Administered 2020-03-13 – 2020-03-18 (×14): 650 mg via ORAL
  Filled 2020-03-13 (×15): qty 2

## 2020-03-13 MED ORDER — SODIUM CHLORIDE 0.9% FLUSH
3.0000 mL | Freq: Two times a day (BID) | INTRAVENOUS | Status: DC
  Administered 2020-03-13 – 2020-03-18 (×9): 3 mL via INTRAVENOUS

## 2020-03-13 MED ORDER — DOXYCYCLINE HYCLATE 100 MG PO TABS
100.0000 mg | ORAL_TABLET | Freq: Two times a day (BID) | ORAL | Status: DC
  Administered 2020-03-13 – 2020-03-19 (×14): 100 mg via ORAL
  Filled 2020-03-13 (×14): qty 1

## 2020-03-13 MED ORDER — TRAMADOL HCL 50 MG PO TABS
50.0000 mg | ORAL_TABLET | Freq: Four times a day (QID) | ORAL | Status: DC | PRN
  Administered 2020-03-13 – 2020-03-19 (×13): 50 mg via ORAL
  Filled 2020-03-13 (×14): qty 1

## 2020-03-13 MED ORDER — SODIUM CHLORIDE 0.9 % IV SOLN: 250.0000 mL | INTRAVENOUS | Status: DC | PRN

## 2020-03-13 MED ORDER — LISINOPRIL 20 MG PO TABS
20.0000 mg | ORAL_TABLET | Freq: Every evening | ORAL | Status: DC
  Administered 2020-03-13 – 2020-03-19 (×7): 20 mg via ORAL
  Filled 2020-03-13 (×7): qty 1

## 2020-03-13 MED ORDER — CARVEDILOL 25 MG PO TABS
25.0000 mg | ORAL_TABLET | Freq: Two times a day (BID) | ORAL | Status: DC
  Administered 2020-03-13 – 2020-03-19 (×14): 25 mg via ORAL
  Filled 2020-03-13 (×11): qty 1
  Filled 2020-03-13: qty 2
  Filled 2020-03-13 (×2): qty 1

## 2020-03-13 MED ORDER — CLOPIDOGREL BISULFATE 75 MG PO TABS
75.0000 mg | ORAL_TABLET | Freq: Every day | ORAL | Status: DC
  Administered 2020-03-13 – 2020-03-19 (×7): 75 mg via ORAL
  Filled 2020-03-13 (×7): qty 1

## 2020-03-13 MED ORDER — PAROXETINE HCL 20 MG PO TABS
20.0000 mg | ORAL_TABLET | Freq: Every day | ORAL | Status: DC
  Administered 2020-03-13 – 2020-03-19 (×7): 20 mg via ORAL
  Filled 2020-03-13 (×8): qty 1

## 2020-03-13 MED ORDER — ATORVASTATIN CALCIUM 10 MG PO TABS
20.0000 mg | ORAL_TABLET | Freq: Every day | ORAL | Status: DC
  Administered 2020-03-13 – 2020-03-19 (×7): 20 mg via ORAL
  Filled 2020-03-13 (×8): qty 2

## 2020-03-13 MED ORDER — FUROSEMIDE 10 MG/ML IJ SOLN
40.0000 mg | Freq: Two times a day (BID) | INTRAMUSCULAR | Status: DC
  Administered 2020-03-13 – 2020-03-14 (×3): 40 mg via INTRAVENOUS
  Filled 2020-03-13 (×4): qty 4

## 2020-03-13 MED ORDER — ONDANSETRON HCL 4 MG/2ML IJ SOLN: 4.0000 mg | Freq: Four times a day (QID) | INTRAMUSCULAR | Status: DC | PRN

## 2020-03-13 MED ORDER — CO Q 10 100 MG PO CAPS: 1.0000 | ORAL_CAPSULE | Freq: Every day | ORAL | Status: DC

## 2020-03-13 MED ORDER — AMLODIPINE BESYLATE 10 MG PO TABS
10.0000 mg | ORAL_TABLET | Freq: Every day | ORAL | Status: DC
  Administered 2020-03-13 – 2020-03-14 (×2): 10 mg via ORAL
  Filled 2020-03-13: qty 2
  Filled 2020-03-13: qty 1

## 2020-03-13 MED ORDER — NITROGLYCERIN 0.4 MG SL SUBL: 0.4000 mg | SUBLINGUAL_TABLET | SUBLINGUAL | Status: DC | PRN

## 2020-03-13 MED ORDER — POLYETHYLENE GLYCOL 3350 17 G PO PACK: 17.0000 g | PACK | Freq: Every day | ORAL | Status: DC | PRN

## 2020-03-13 MED ORDER — INSULIN ASPART 100 UNIT/ML ~~LOC~~ SOLN
0.0000 [IU] | Freq: Three times a day (TID) | SUBCUTANEOUS | Status: DC
  Administered 2020-03-13: 2 [IU] via SUBCUTANEOUS
  Administered 2020-03-13 – 2020-03-14 (×3): 3 [IU] via SUBCUTANEOUS
  Administered 2020-03-14 (×2): 5 [IU] via SUBCUTANEOUS
  Administered 2020-03-15 (×3): 3 [IU] via SUBCUTANEOUS

## 2020-03-13 NOTE — ED Notes (Signed)
Upon moving pt to room 44, this RN assessed pt on room air - SpO2 90% Placed back on 2 liters [place] for comfort. Pt SpO2 97%

## 2020-03-13 NOTE — Progress Notes (Signed)
  Echocardiogram 2D Echocardiogram has been performed.  [name] 03/13/2020, 9:00 AM

## 2020-03-13 NOTE — ED Notes (Signed)
Notified Dr. Zigmund Daniel of pt's troponin level of 224.

## 2020-03-13 NOTE — Progress Notes (Signed)
86 year old female admitted early this morning with complaints of shortness of breath. Is post right foot transmetatarsal amputation on 03/11/2020 postop she was hypotensive received IV fluids blood pressure improved and she was discharged home the same day.  She comes back in on 03/12/2020 with complaints of increasing shortness of breath found to have congestive heart failure with pulmonary edema.  BNP elevated at 1584 patient received Lasix in the ER troponin 238.  Sodium 141 potassium 4.0 BUN 22 creatinine 0.66 mag level 1.7 AST 280 ALT 304 down from 892 and 428 likely from passive liver congestion. Current medications are Norvasc Lipitor Coreg Plavix Lasix 40 mg twice a day Lantus 35 units estradiol nitroglycerin She has bibasilar crackles on exam.  On 2 L of oxygen saturation 95%.  Intermittently tachypneic.  Blood pressure 147/56.  Chest x-ray CHF with mild interstitial edema and bilateral pleural effusion and bibasilar atelectasis.  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  CT abdomen and pelvis mild acute uncomplicated diverticulitis of the ascending colon advanced multifocal colonic diverticular disease throughout the entire colon, hepatic steatosis, posterior gastric diverticulum unchanged from prior chest CT in 2018 containing intraluminal fluid.  CT angiogram of the chest no pulmonary embolism, CHF, moderate bilateral pleural effusion with compressive atelectasis and mild pulmonary edema.  Thyroid goiter with substernal extension on the right causing mild leftward mass-effect on the trachea and esophagus stable from prior exam.  Will need to closely monitor on IV diuresis follow-up echocardiogram get PT consult discussed with patient's son.

## 2020-03-13 NOTE — ED Notes (Signed)
Lunch provided.

## 2020-03-13 NOTE — ED Notes (Signed)
ECHO tech at pt bedside. 

## 2020-03-13 NOTE — Progress Notes (Signed)

## 2020-03-13 NOTE — ED Notes (Signed)
Pt c/o pain in leg. Message will sent to admitting md for further guidance.

## 2020-03-13 NOTE — H&P (Addendum)
History and Physical    [name]:[id number] DOB: [birth year] DOA: 03/12/2020  PCP: Marton Redwood, MD  Patient coming from: Home   Chief Complaint:  Chief Complaint  Patient presents with  . Shortness of Breath     HPI:    86 year old female with past medical history of coronary artery disease (cath 2014 with stent to circumflex, CTO of RCA with collaterals), IDDM type II, peripheral vascular disease (S/P arthrectomy and angioplasty of left peroneal and left superficial femoral 04/2018), multiple diabetic foot ulcers, gastroesophageal reflux disease, hypothyroidism, hypertension who presents to [facility] emergency department complaints of shortness of breath.  Patient underwent right foot transmetatarsal amputation on 8/26 performed by Dr. Doran Durand for gangrenous right foot ulcer.  Postoperatively in recovery, patient was found to be hypotensive requiring administration of intravenous fluids.  Patient's blood pressures eventually improved and patient was discharged home later that evening.  During the evening of 8/26 and into the morning on 8/27 the patient developed severe shortness of breath.  Patient states that the shortness of breath is worse with minimal exertion and improved with rest.  Patient complains of associated nonproductive cough.  Patient also complains of associated paroxysmal nocturnal dyspnea and pillow orthopnea throughout the evening.  Patient denies any associated increased abdominal girth or lower extremity edema.  Patient denies associated chest pain.  Patient symptoms continue to worsen until she eventually presented to [facility] emergency department.  Upon evaluation in the emergency room patient was found to be hypoxic with oxygen saturations in the 80s requiring supplemental oxygen therapy.  Chest imaging was performed revealing no evidence of pulmonary embolism on CTA of the chest but evidence of patchy bilateral infiltrates concerning for  pulmonary edema.  BNP was additionally found to be markedly elevated at 1584.  Patient was administered 40 mg of intravenous Lasix.  Patient was also found to have a somewhat elevated troponin of 238.  Patient denies chest pain and exhibited no evidence of dynamic ST segment changes.  Case was discussed with Dr. Jeannette Corpus with cardiology who recommended echocardiogram in the morning with formal consultation then.  The hospitalist group was then called to assess the patient for admission to the hospital.  Review of Systems:  Review of Systems  Respiratory: Positive for cough and shortness of breath.   Musculoskeletal:       Right leg pain   All other systems reviewed and are negative.     Past Medical History:  Diagnosis Date  . Anxiety   . CAD (coronary artery disease)   . Cellulitis 10/2015   LEFT FOOT  . Complication of anesthesia   . Coronary artery disease   . Diabetes mellitus without complication (HCC)    insulin dependent  . GERD (gastroesophageal reflux disease)   . Hypertension   . Hypothyroidism   . Neuromuscular disorder (HCC)    muscle cramps to lower extremities  . Other primary cardiomyopathies   . PONV (postoperative nausea and vomiting)   . Shortness of breath     Past Surgical History:  Procedure Laterality Date  . ABDOMINAL AORTOGRAM N/A 09/03/2018   Procedure: ABDOMINAL AORTOGRAM;  Surgeon: Serafina Mitchell, MD;  Location: [place] [facility];  Service: Cardiovascular;  Laterality: N/A;  . ABDOMINAL AORTOGRAM W/LOWER EXTREMITY N/A 04/16/2018   Procedure: ABDOMINAL AORTOGRAM W/LOWER EXTREMITY;  Surgeon: Serafina Mitchell, MD;  Location: [place] [facility];  Service: Cardiovascular;  Laterality: N/A;  unilateral  . ABDOMINAL HYSTERECTOMY    .  AMPUTATION Left 12/03/2018   Procedure: Left 3rd ray amputation;  Surgeon: Wylene Simmer, MD;  Location: [place];  Service: Orthopedics;  Laterality: Left;  42min  . CHOLECYSTECTOMY    . CORONARY ANGIOPLASTY  WITH STENT PLACEMENT  08/09/2011   DES  to mid circumflex  . I & D EXTREMITY Left 10/29/2015   Procedure: Irrigation and Debridement Left Foot;  Surgeon: Newt Minion, MD;  Location: [place];  Service: Orthopedics;  Laterality: Left;  . LEFT HEART CATHETERIZATION WITH CORONARY ANGIOGRAM N/A 08/08/2012   Procedure: LEFT HEART CATHETERIZATION WITH CORONARY ANGIOGRAM;  Surgeon: [name], MD;  Location: [facility] CATH LAB;  Service: Cardiovascular;  Laterality: N/A;  . PERIPHERAL VASCULAR ATHERECTOMY  04/16/2018   Procedure: PERIPHERAL VASCULAR ATHERECTOMY;  Surgeon: Serafina Mitchell, MD;  Location: [place] [facility];  Service: Cardiovascular;;  lt. Peroneal  . PERIPHERAL VASCULAR BALLOON ANGIOPLASTY  04/16/2018   Procedure: PERIPHERAL VASCULAR BALLOON ANGIOPLASTY;  Surgeon: Serafina Mitchell, MD;  Location: [name] [facility];  Service: Cardiovascular;;  lt. sfa and AT  . PERIPHERAL VASCULAR BALLOON ANGIOPLASTY Left 09/03/2018   Procedure: PERIPHERAL VASCULAR BALLOON ANGIOPLASTY;  Surgeon: Serafina Mitchell, MD;  Location: [place] [facility];  Service: Cardiovascular;  Laterality: Left;  TP TRUNK  . PERIPHERAL VASCULAR CATHETERIZATION N/A 12/30/2014   Procedure: Lower Extremity Angiography;  Surgeon: Wellington Hampshire, MD;  Location: [place] [facility];  Service: Cardiovascular;  Laterality: N/A;  . PERIPHERAL VASCULAR INTERVENTION Left 09/03/2018   Procedure: PERIPHERAL VASCULAR INTERVENTION;  Surgeon: Serafina Mitchell, MD;  Location: [place] [facility];  Service: Cardiovascular;  Laterality: Left;  SFA STENT   . TENDON RELEASE Right 03/11/2020   Procedure: Heel Cord Lengthening;  Surgeon: Wylene Simmer, MD;  Location: [place];  Service: Orthopedics;  Laterality: Right;  . THYROID SURGERY     radioactive iodine   . TONSILLECTOMY    . TRANSMETATARSAL AMPUTATION Right 03/11/2020   Procedure: Right foot transmetatarsal amputation;  Surgeon: Wylene Simmer, MD;  Location: [place];  Service: Orthopedics;  Laterality: Right;     reports that she quit smoking about 59 years ago. She has never used smokeless tobacco. She reports current alcohol use. She reports that she does not use drugs.  Allergies  Allergen Reactions  . Sulfa Antibiotics Nausea And Vomiting    Severe vomiting.     Family History  Problem Relation Age of Onset  . Heart disease Father   . Heart attack Father   . Diabetes Sister   . Heart disease Son        before age 63  . Heart attack 22        84yr old  . Sudden death Grandchild   . Hypertension Neg Hx      Prior to Admission medications   Medication Sig Start Date End Date Taking? Authorizing Provider  amLODipine (NORVASC) 10 MG tablet TAKE 1 TABLET BY MOUTH ONCE DAILY 02/27/20  Yes [name], MD  aspirin EC 325 MG tablet Take 325 mg by mouth at bedtime.    Yes [provider]  atorvastatin (LIPITOR) 20 MG tablet Take 1 tablet (20 mg total) by mouth daily. Please keep upcoming appt in July with Dr. Irish Lack before anymore refills. Thank you 01/05/20  Yes [name], MD  carvedilol (COREG) 25 MG tablet Take 1 tablet (25 mg total) by mouth 2 (two) times daily with a meal. *Please call  and schedule an appointment with Dr Fletcher Anon* 07/18/16  Yes Wellington Hampshire, MD  cholecalciferol (VITAMIN D) 1000 units tablet Take 2,000 Units by mouth daily.    Yes [provider]  Coenzyme Q10 (CO Q 10) 100 MG CAPS Take 1 capsule by mouth daily.    Yes [provider]  doxycycline (ADOXA) 100 MG tablet Take 1 tablet (100 mg total) by mouth 2 (two) times daily for 14 days. 03/11/20 03/25/20 Yes Corky Sing, PA-C  ergocalciferol (VITAMIN D2) 1.25 MG (50000 UT) capsule Take 50,000 Units by mouth once a week.   Yes [provider]  Insulin Glargine (BASAGLAR KWIKPEN [place]) Inject 70 Units into the skin every evening.   Yes [provider]  insulin lispro (HUMALOG) 100 UNIT/ML injection  Inject 10 units with breakfast and 16 units with lunch and dinner,   Yes [provider]  lisinopril (PRINIVIL,ZESTRIL) 20 MG tablet Take 20 mg by mouth every evening.  10/29/15  Yes [provider]  nitroGLYCERIN (NITROSTAT) 0.4 MG SL tablet PLACE 1 TABLET UNDER THE TONGUE EVERY 5 MINUTES AS NEEDED FOR CHEST PAIN 09/19/19  Yes [name], MD  pantoprazole (PROTONIX) 40 MG tablet TAKE 1 TABLET BY MOUTH ONCE DAILY AT  6AM. Please keep upcoming appt in July with Dr. Irish Lack before anymore refills. Thank you Patient taking differently: Take 40 mg by mouth daily as needed (heartburn). TAKE 1 TABLET BY MOUTH ONCE DAILY AT  6AM. Please keep upcoming appt in July with Dr. Irish Lack before anymore refills. Thank you 01/02/20  Yes [name], MD  PARoxetine (PAXIL) 20 MG tablet Take 20 mg by mouth daily.    Yes [provider]  traMADol (ULTRAM) 50 MG tablet Take 1 tablet (50 mg total) by mouth every 6 (six) hours as needed. Patient taking differently: Take 50 mg by mouth every 6 (six) hours as needed for moderate pain or severe pain.  03/11/20 03/11/21 Yes Corky Sing, PA-C  clopidogrel (PLAVIX) 75 MG tablet TAKE 1 TABLET BY MOUTH ONCE DAILY 05/04/19   Serafina Mitchell, MD  FREESTYLE LITE test strip USE TO CHECK BS TWICE DAILY  AND PRN 02/10/19   [provider]    Physical Exam: Vitals:   03/12/20 2115 03/12/20 2245 03/12/20 2345 03/13/20 0015  BP: (!) 147/59 (!) 153/64 (!) 162/71 (!) 144/67  Pulse: 74 76 80 69  Resp: (!) 24 (!) 27 (!) 22 19  Temp:      TempSrc:      SpO2: 95% 91% 93% 94%    Constitutional: Acute alert and oriented x3, patient is in mild respiratory distress Skin: Dressing of the distal right foot is clean dry and intact.  No other lesions noted.  Good skin turgor noted. Eyes: Pupils are equally reactive to light.  No evidence of scleral icterus or conjunctival pallor.  ENMT: Moist mucous membranes noted.  Posterior pharynx  clear of any exudate or lesions.   Neck: normal, supple, no masses, no thyromegaly.  Notable markedly elevated jugular venous pulse at 45 degrees. Respiratory: Significant rales in the bilateral lower and mid fields.  No evidence of wheezing.  Normal respiratory effort. No accessory muscle use.  Cardiovascular: Regular rate and rhythm, no murmurs / rubs / gallops. No extremity edema. 2+ pedal pulses. No carotid bruits.  Chest:   Nontender without crepitus or deformity.   Back:   Nontender without crepitus or deformity. Abdomen: Abdomen is soft and nontender.  No evidence  of intra-abdominal masses.  Positive bowel sounds noted in all quadrants.   Musculoskeletal: Notable dressing is clean dry and intact to the right foot as noted above.  Right surgical boot is in place.  No joint deformity upper and lower extremities. Good ROM,Normal muscle tone.  Neurologic: CN 2-12 grossly intact. Sensation intact, patient is moving all 4 extremities spontaneously.  Patient is following all commands.  Patient is responsive to verbal stimuli.   Psychiatric: Patient presents as a normal mood with appropriate affect.  Patient seems to possess insight as to theircurrent situation.     Labs on Admission: I have personally reviewed following labs and imaging studies -   CBC: Recent Labs  Lab 03/12/20 1414  WBC 17.5*  NEUTROABS 14.7*  HGB 8.2*  HCT 27.2*  MCV 87.5  PLT 893*   Basic Metabolic Panel: Recent Labs  Lab 03/08/20 1115 03/12/20 1414  NA 141 139  K 4.0 4.4  CL 104 102  CO2 26 23  GLUCOSE 131* 215*  BUN 19 26*  CREATININE 0.80 0.95  CALCIUM 9.0 8.6*   GFR: Estimated Creatinine Clearance: 48 mL/min (by C-G formula based on SCr of 0.95 mg/dL). Liver Function Tests: Recent Labs  Lab 03/12/20 1414  AST 892*  ALT 428*  ALKPHOS 127*  BILITOT 0.5  PROT 6.7  ALBUMIN 2.5*   Recent Labs  Lab 03/12/20 2013  LIPASE 19   No results for input(s): AMMONIA in the last 168  hours. Coagulation Profile: No results for input(s): INR, PROTIME in the last 168 hours. Cardiac Enzymes: No results for input(s): CKTOTAL, CKMB, CKMBINDEX, TROPONINI in the last 168 hours. BNP (last 3 results) No results for input(s): PROBNP in the last 8760 hours. HbA1C: No results for input(s): HGBA1C in the last 72 hours. CBG: Recent Labs  Lab 03/11/20 0805 03/11/20 1010  GLUCAP 168* 177*   Lipid Profile: No results for input(s): CHOL, HDL, LDLCALC, TRIG, CHOLHDL, LDLDIRECT in the last 72 hours. Thyroid Function Tests: Recent Labs    03/12/20 2015  TSH 0.892   Anemia Panel: No results for input(s): VITAMINB12, FOLATE, FERRITIN, TIBC, IRON, RETICCTPCT in the last 72 hours. Urine analysis: No results found for: COLORURINE, APPEARANCEUR, LABSPEC, PHURINE, GLUCOSEU, HGBUR, BILIRUBINUR, KETONESUR, PROTEINUR, UROBILINOGEN, NITRITE, LEUKOCYTESUR  Radiological Exams on Admission - Personally Reviewed: DG Chest 2 View  Result Date: 03/12/2020 CLINICAL DATA:  Short of breath EXAM: CHEST - 2 VIEW COMPARISON:  CT chest 12/14/2016 FINDINGS: Pulmonary vascular congestion with increased interstitial markings compatible with edema. Small bilateral effusions and bibasilar atelectasis Right paratracheal soft tissue mass corresponding to substernal goiter as noted on CT. IMPRESSION: Congestive heart failure with mild interstitial edema and bilateral pleural effusions. Bibasilar atelectasis Substernal goiter in the right paratracheal region. Electronically Signed   By: Franchot Gallo M.D.   On: 03/12/2020 15:16   CT Angio Chest PE W and/or Wo Contrast  Result Date: 03/12/2020 CLINICAL DATA:  Shortness of breath transmetatarsal amputation yesterday. EXAM: CT ANGIOGRAPHY CHEST WITH CONTRAST TECHNIQUE: Multidetector CT imaging of the chest was performed using the standard protocol during bolus administration of intravenous contrast. Multiplanar CT image reconstructions and MIPs were obtained to  evaluate the vascular anatomy. Performed in conjunction with CT of the abdomen/pelvis, reported separately. CONTRAST:  165mL OMNIPAQUE IOHEXOL 350 MG/ML SOLN COMPARISON:  Radiograph earlier today. FINDINGS: Cardiovascular: There are no filling defects within the pulmonary arteries to suggest pulmonary embolus. Aortic atherosclerosis without aortic aneurysm. Mild multi chamber cardiomegaly. Coronary artery calcifications. No pericardial  effusion. Mediastinum/Nodes: Thyroid goiter with substernal extension on the right causing mild leftward mass effect on the trachea and esophagus. This is stable from prior exams. This has been evaluated on previous imaging. (ref: J Am Coll Radiol. 2015 Feb;12(2): 143-50).Prior thyroid ultrasound 09/13/2017 multiple small mediastinal lymph nodes are not enlarged by size criteria. No esophageal wall thickening. Lungs/Pleura: Moderate bilateral pleural effusions with compressive atelectasis. Mild smooth septal thickening consistent with pulmonary edema. No pulmonary mass or suspicious nodule. Upper Abdomen: Assessed on concurrent abdominal CT, reported separately. Musculoskeletal: Scoliosis and multilevel degenerative change in the spine. There are no acute or suspicious osseous abnormalities. Review of the MIP images confirms the above findings. IMPRESSION: 1. No pulmonary embolus. 2. CHF. Moderate bilateral pleural effusions with compressive atelectasis. Mild pulmonary edema. 3. Thyroid goiter with substernal extension on the right causing mild leftward mass effect on the trachea and esophagus. This is stable from prior exams. This has been evaluated on previous imaging. (ref: J Am Coll Radiol. 2015 Feb;12(2): 143-50). Aortic Atherosclerosis (ICD10-I70.0). Electronically Signed   By: Keith Rake M.D.   On: 03/12/2020 21:47   CT ABDOMEN PELVIS W CONTRAST  Result Date: 03/12/2020 CLINICAL DATA:  Abdominal pain.  Elevated LFTs. EXAM: CT ABDOMEN AND PELVIS WITH CONTRAST  TECHNIQUE: Multidetector CT imaging of the abdomen and pelvis was performed using the standard protocol following bolus administration of intravenous contrast. Performed in conjunction with CT of the chest, reported separately. CONTRAST:  158mL OMNIPAQUE IOHEXOL 350 MG/ML SOLN COMPARISON:  Included portion from chest CT 11/26/2016. FINDINGS: Lower chest: Assessed on concurrent chest CTA. Moderate pleural effusions and compressive atelectasis. Hepatobiliary: No focal liver abnormality is seen. Diffusely decreased hepatic density consistent with steatosis. Status post cholecystectomy. No biliary dilatation. Pancreas: Parenchymal atrophy. No ductal dilatation or inflammation. Minimal calcifications in the pancreatic head. Spleen: Normal in size without focal abnormality. Adrenals/Urinary Tract: Normal adrenal glands. No hydronephrosis. There are lobulated renal contours, left greater than right. Homogeneous renal enhancement with symmetric excretion on delayed phase imaging. Subcentimeter low-density in the lower right kidney is too small to characterize but likely small cyst. Urinary bladder is physiologically distended without wall thickening. Stomach/Bowel: There is a posterior gastric diverticulum that is unchanged from prior chest CT 12/14/2016. This contains intraluminal fluid. No adjacent fat stranding. Stomach is otherwise decompressed. Small duodenal diverticulum arising from the fourth portion. No inflammatory change. Small bowel is unremarkable without obstruction, inflammation, or obvious wall thickening. Lipomatous hypertrophy of the ileocecal valve versus small cecal lipoma, nonobstructing. Appendix not well visualized. No appendicitis. Multifocal and diffuse diverticulosis including the right colon where there is mild fat stranding adjacent to a diverticulum in the ascending colon, series 5, image 47/48. No other colonic inflammation. No colonic wall thickening. Vascular/Lymphatic: Aortic  atherosclerosis. No aortic aneurysm. Patent portal vein. Left mesenteric calcification consistent with calcified lymph nodes. No enlarged lymph nodes in the abdomen or pelvis. Reproductive: Status post hysterectomy. No adnexal masses. Other: Trace free fluid in the pelvis. No upper abdominal ascites. No free air. No intra-abdominal abscess. Tiny fat containing umbilical hernia. Musculoskeletal: Diffuse degenerative change in the spine with scoliosis. Mild L5 compression fracture that appears chronic. Degenerative change of the hips and pubic symphysis. No focal bone lesion. IMPRESSION: 1. Mild acute uncomplicated diverticulitis of the ascending colon. 2. Advanced multifocal colonic diverticular disease throughout the entire colon. 3. Hepatic steatosis. 4. Posterior gastric diverticulum, unchanged from prior chest CT 12/14/2016. This contains intraluminal fluid. No adjacent fat stranding. 5. Additional chronic findings as described.  Aortic Atherosclerosis (ICD10-I70.0). Electronically Signed   By: Keith Rake M.D.   On: 03/12/2020 21:55    EKG: Personally reviewed.  Rhythm is normal sinus rhythm with heart rate of 75 bpm.  Notable wide QRS, notable T wave inversions in V5 and V6.   Assessment/Plan Principal Problem:   Acute cardiogenic pulmonary edema (HCC)   Patient presenting with sudden onset severe shortness of breath and nonproductive cough shortly after undergoing right foot transmetatarsal amputation on 8/26.  CTA chest reveals no evidence of pulmonary embolism but does reveal bilateral patchy infiltrates concerning for acute cardiogenic pulmonary edema  Patient has a longstanding history of coronary artery disease with last heart catheterization in 2014 without a recent echocardiogram.  Concerned patient may be developing some degree of congestive heart failure.  Patient also noted to have markedly elevated BNP of 1584 and elevated troponin of 238  Case already discussed with Dr. Jeannette Corpus  of the emergency department staff who recommends echocardiogram in the morning and formal cardiology consultation then.  Lasix 40 mg IV every 12 initiated.  Strict input and output monitoring  Monitoring patient on telemetry  Provided patient with supplemental oxygen for associated acute hypoxic respiratory failure  Bilateral atypical pneumonia is much less likely although due to the fact the patient has leukocytosis obtaining blood cultures.  Active Problems:   Acute respiratory failure with hypoxia (HCC)   Please see assessment plan above    Elevated troponin level not due myocardial infarction   In the absence of chest pain or dynamic ST segment changes, elevated troponin is unlikely to be secondary to plaque rupture  More likely to be secondary to supply demand mismatch from acute cardiogenic volume overload  Monitoring patient on telemetry  As needed nitroglycerin  As mentioned above, cardiology consultation in the morning.    Coronary artery disease involving native coronary artery of native heart   Continue home regimen of antiplatelet therapy  Continue home regimen of beta-blocker therapy  Patient is chest pain-free  Notable elevated troponin unlikely to be secondary to plaque rupture and more likely secondary to supply demand mismatch due to acute cardiogenic volume overload    Essential hypertension   Continue home regimen of antihypertensive therapy    Severe peripheral arterial disease ([place])   Continue home regimen of  antiplatelet therapy.    Diabetic ulcer of right foot ([place])   Per my review of the chart patient developed significant right foot diabetic foot ulcer with gangrenous changes requiring transmetatarsal amputation on 8/26.  We will continue recently prescribed regimen of doxycycline per podiatry operative note  Patient is not to undergo dressing change until her follow-up with podiatry so this will not be removed during this  hospitalization.    Anemia   Notable anemia on CBC today without obvious evidence of bleeding clinically  Obtaining reticulocyte count, iron panel, folate, vitamin B12  Monitoring hemoglobin and hematocrit with serial CBCs  Degree of anemia may be exacerbated by volume overload.    Thyroid goiter  Longstanding known history of extensive thyroid goiter with substernal extension on the right causing mild leftward mass-effect on the trachea and esophagus  This apparently is no change compared to prior imaging.  Continue outpatient follow-up with endocrinology    Mixed hyperlipidemia   Continue home regimen of statin therapy    GERD without esophagitis  Continue home regimen of PPI  Diabetes mellitus type II with vascular complication on long-term insulin   Continue Accu-Cheks before every meal and nightly  with sliding scale insulin  Obtaining hemoglobin A1c  Considering tenuous oral intake will have basal insulin to 35 units nightly as well as reducing mealtime Humalog to 10 units before every meal  Elevated liver enzymes   Patient denies alcohol use  Possibly secondary to hepatic congestion due to acute cardiogenic volume overload  Echocardiogram pending  Trending hepatic enzymes daily  CT abdomen pelvis reveals no evidence of obvious cirrhosis or hepatobiliary dilatation  Hepatitis panel pending  Holding statin therapy   Code Status:  DNR Family Communication: deferred   Status is: Observation  The patient remains OBS appropriate and will d/c before 2 midnights.  Dispo: The patient is from: Home              Anticipated d/c is to: Home              Anticipated d/c date is: 2 days              Patient currently is not medically stable to d/c.        Vernelle Emerald MD [facility] Pager [phone]  If 7PM-7AM, please contact night-coverage [email or web address] Use universal [place] password for that web site. If you do not have the  password, please call the hospital operator.  03/13/2020, 12:31 AM

## 2020-03-14 ENCOUNTER — Inpatient Hospital Stay (HOSPITAL_COMMUNITY): Payer: HMO

## 2020-03-14 DIAGNOSIS — I5021 Acute systolic (congestive) heart failure: Secondary | ICD-10-CM

## 2020-03-14 DIAGNOSIS — R7989 Other specified abnormal findings of blood chemistry: Secondary | ICD-10-CM

## 2020-03-14 LAB — COMPREHENSIVE METABOLIC PANEL
ALT: 219 U/L — ABNORMAL HIGH (ref 0–44)
AST: 144 U/L — ABNORMAL HIGH (ref 15–41)
Albumin: 2.1 g/dL — ABNORMAL LOW (ref 3.5–5.0)
Alkaline Phosphatase: 93 U/L (ref 38–126)
Anion gap: 10 (ref 5–15)
BUN: 27 mg/dL — ABNORMAL HIGH (ref 8–23)
CO2: 29 mmol/L (ref 22–32)
Calcium: 8.5 mg/dL — ABNORMAL LOW (ref 8.9–10.3)
Chloride: 101 mmol/L (ref 98–111)
Creatinine, Ser: 0.67 mg/dL (ref 0.44–1.00)
GFR calc Af Amer: >60 mL/min (ref >60)
GFR calc non Af Amer: >60 mL/min (ref >60)
Glucose, Bld: 150 mg/dL — ABNORMAL HIGH (ref 70–99)
Potassium: 3.8 mmol/L (ref 3.5–5.1)
Sodium: 140 mmol/L (ref 135–145)
Total Bilirubin: 0.5 mg/dL (ref 0.3–1.2)
Total Protein: 5.7 g/dL — ABNORMAL LOW (ref 6.5–8.1)

## 2020-03-14 LAB — GLUCOSE, CAPILLARY
Glucose-Capillary: 153 mg/dL — ABNORMAL HIGH (ref 70–99)
Glucose-Capillary: 234 mg/dL — ABNORMAL HIGH (ref 70–99)
Glucose-Capillary: 191 mg/dL — ABNORMAL HIGH (ref 70–99)
Glucose-Capillary: 218 mg/dL — ABNORMAL HIGH (ref 70–99)

## 2020-03-14 LAB — CBC
HCT: 25.9 % — ABNORMAL LOW (ref 36.0–46.0)
Hemoglobin: 7.8 g/dL — ABNORMAL LOW (ref 12.0–15.0)
MCH: 26.1 pg (ref 26.0–34.0)
MCHC: 30.1 g/dL (ref 30.0–36.0)
MCV: 86.6 fL (ref 80.0–100.0)
Platelets: 505 10*3/uL — ABNORMAL HIGH (ref 150–400)
RBC: 2.99 MIL/uL — ABNORMAL LOW (ref 3.87–5.11)
RDW: 15 % (ref 11.5–15.5)
WBC: 12.2 10*3/uL — ABNORMAL HIGH (ref 4.0–10.5)
nRBC: 0 % (ref 0.0–0.2)

## 2020-03-14 LAB — MAGNESIUM: Magnesium: 1.7 mg/dL (ref 1.7–2.4)

## 2020-03-14 MED ORDER — FUROSEMIDE 10 MG/ML IJ SOLN
80.0000 mg | Freq: Two times a day (BID) | INTRAMUSCULAR | Status: DC
  Administered 2020-03-14 – 2020-03-15 (×3): 80 mg via INTRAVENOUS
  Filled 2020-03-14 (×3): qty 8

## 2020-03-14 MED ORDER — AMLODIPINE BESYLATE 2.5 MG PO TABS
2.5000 mg | ORAL_TABLET | Freq: Every day | ORAL | Status: DC
  Administered 2020-03-15 – 2020-03-17 (×3): 2.5 mg via ORAL
  Filled 2020-03-14 (×3): qty 1

## 2020-03-14 MED ORDER — FUROSEMIDE 10 MG/ML IJ SOLN
60.0000 mg | Freq: Four times a day (QID) | INTRAMUSCULAR | Status: DC
  Administered 2020-03-14: 60 mg via INTRAVENOUS
  Filled 2020-03-14: qty 6

## 2020-03-14 NOTE — Progress Notes (Signed)
PROGRESS NOTE    [name]  CHE:[id number] DOB: 07-08-1932 DOA: 03/12/2020 PCP: Marton Redwood, MD   Brief Narrative: HPI per Dr. Cyd Silence 03/13/2020 86 year old female with past medical history of coronary artery disease (cath 2014 with stent to circumflex, CTO of RCA with collaterals), IDDM type II, peripheral vascular disease (S/P arthrectomy and angioplasty of left peroneal and left superficial femoral 04/2018), multiple diabetic foot ulcers, gastroesophageal reflux disease, hypothyroidism, hypertension who presents to [facility] emergency department complaints of shortness of breath.  Patient underwent right foot transmetatarsal amputation on 8/26 performed by Dr. Doran Durand for gangrenous right foot ulcer.  Postoperatively in recovery, patient was found to be hypotensive requiring administration of intravenous fluids.  Patient's blood pressures eventually improved and patient was discharged home later that evening.  During the evening of 8/26 and into the morning on 8/27 the patient developed severe shortness of breath.  Patient states that the shortness of breath is worse with minimal exertion and improved with rest.  Patient complains of associated nonproductive cough.  Patient also complains of associated paroxysmal nocturnal dyspnea and pillow orthopnea throughout the evening.  Patient denies any associated increased abdominal girth or lower extremity edema.  Patient denies associated chest pain.  Patient symptoms continue to worsen until she eventually presented to [facility] emergency department.  Upon evaluation in the emergency room patient was found to be hypoxic with oxygen saturations in the 80s requiring supplemental oxygen therapy.  Chest imaging was performed revealing no evidence of pulmonary embolism on CTA of the chest but evidence of patchy bilateral infiltrates concerning for pulmonary edema.  BNP was additionally found to be markedly elevated at 1584.   Patient was administered 40 mg of intravenous Lasix.  Patient was also found to have a somewhat elevated troponin of 238.  Patient denies chest pain and exhibited no evidence of dynamic ST segment changes.  Case was discussed with Dr. Jeannette Corpus with cardiology who recommended echocardiogram in the morning with formal consultation then.  The hospitalist group was then called to assess the patient for admission to the hospital.   Assessment & Plan:   Principal Problem:   Acute cardiogenic pulmonary edema (HCC) Active Problems:   Coronary artery disease involving native coronary artery of native heart   Essential hypertension   Mixed hyperlipidemia   Severe peripheral arterial disease (HCC)   Diabetic ulcer of right foot (HCC)   Elevated troponin level not due myocardial infarction   GERD without esophagitis   Anemia   Thyroid goiter   Acute respiratory failure with hypoxia (HCC)   Type 2 diabetes mellitus with complication, without long-term current use of insulin (HCC)   Elevated liver enzymes   Acute on chronic congestive heart failure (HCC)   #1 acute hypoxic respiratory failure secondary to acute  CHF new onset-patient admitted with dyspnea on exertion and progressively worsening shortness of breath.  Chest x-ray consistent with pulmonary edema with elevated BNP of 1584 and lower extremity edema. Echocardiogram-EF 45 to 50% left ventricle mildly decreased function left ventricle also demonstrate global hypokinesis.  Moderate mitral stenosis. Eyes and dosed still indicates she is positive by 270 cc. Patient continues with shortness of breath and oxygen dependent. Renal function remained stable 0.67 creatinine. Will increase Lasix dose continue assess.  Monitor renal functions.  Decrease Norvasc.  #2 abnormal LFTs sec likely secondary to passive liver congestion improving  #3 uncomplicated diverticulitis of the descending colon by CT scan patient asymptomatic  #4 type 2 diabetes with  neuropathy continue Lantus CBG (last 3)  Recent Labs    03/13/20 1841 03/13/20 2109 03/14/20 0616  GLUCAP 134* 160* 153*    #5 elevated troponin no evidence of acute coronary syndrome, likely due to demand ischemia from volume overload.  #6 history of essential hypertension continue above medications  #7 history of peripheral arterial disease continue current meds  #8 diabetic foot ulcer right foot status post transmetatarsal amputation 03/11/2020.  On.doxycycline finish the course.  WBC is trending down  #9 anemia chronic stable  #10 thyroid goiter patient has a known extensive thyroid goiter with substernal extension on the right causing mild shift to the left of the trachea and esophagus.     Estimated body mass index is 26.92 kg/m as calculated from the following:   Height as of this encounter: 5\' 8"  (1.727 m).   Weight as of this encounter: 80.3 kg.  DVT prophylaxis: Lovenox  code Status: DO NOT RESUSCITATE Family Communication: Discussed with her son yesterday over the phone  disposition Plan:  Status is: Inpatient   Dispo: The patient is from: Home              Anticipated d/c is to: Home              Anticipated d/c date is: 2 days              Patient currently is not medically stable to d/c.    Consultants: Will consult cardiology  Procedures: None Antimicrobials: None  Subjective: Patient resting in bed still dyspnea on exertion and orthopnea No chest pain nausea vomiting or diarrhea reported complaints of pain in the right foot at the amputation site  Objective: Vitals:   03/13/20 1945 03/14/20 0041 03/14/20 0416 03/14/20 0723  BP: (!) 100/54 121/82 138/73 (!) 140/58  Pulse: (!) 53 62 65 66  Resp: 18 18 19 18   Temp: (!) 97.5 F (36.4 C) 97.6 F (36.4 C) 97.8 F (36.6 C) 98.2 F (36.8 C)  TempSrc: Oral Oral Oral Oral  SpO2: 93% 93% 92% 94%  Weight:   80.3 kg   Height:        Intake/Output Summary (Last 24 hours) at 03/14/2020 1118 Last  data filed at 03/14/2020 0840 Gross per 24 hour  Intake 720 ml  Output 450 ml  Net 270 ml   Filed Weights   03/13/20 0939 03/13/20 1600 03/14/20 0416  Weight: 79.4 kg 80 kg 80.3 kg    Examination:  General exam: Appears calm and comfortable  Respiratory system: Crackles bibasilar to auscultation. Respiratory effort normal. Cardiovascular system: S1 & S2 heard, RRR. No JVD, murmurs, rubs, gallops or clicks. No pedal edema. Gastrointestinal system: Abdomen is nondistended, soft and nontender. No organomegaly or masses felt. Normal bowel sounds heard. Central nervous system: Alert and oriented. No focal neurological deficits. Extremities: Symmetric 5 x 5 power. Skin: No rashes, lesions or ulcers Psychiatry: Judgement and insight appear normal. Mood & affect appropriate.     Data Reviewed: I have personally reviewed following labs and imaging studies  CBC: Recent Labs  Lab 03/12/20 1414 03/13/20 0747 03/14/20 0452  WBC 17.5* 15.4* 12.2*  NEUTROABS 14.7* 12.5*  --   HGB 8.2* 8.8* 7.8*  HCT 27.2* 28.6* 25.9*  MCV 87.5 87.5 86.6  PLT 569* 589* 846*   Basic Metabolic Panel: Recent Labs  Lab 03/08/20 1115 03/12/20 1414 03/13/20 0747 03/14/20 0452  NA 141 139 141 140  K 4.0 4.4 4.0 3.8  CL 104 102  101 101  CO2 26 23 27 29   GLUCOSE 131* 215* 176* 150*  BUN 19 26* 22 27*  CREATININE 0.80 0.95 0.66 0.67  CALCIUM 9.0 8.6* 9.1 8.5*  MG  --   --  1.7  --    GFR: Estimated Creatinine Clearance: 57.2 mL/min (by C-G formula based on SCr of 0.67 mg/dL). Liver Function Tests: Recent Labs  Lab 03/12/20 1414 03/13/20 0747 03/14/20 0452  AST 892* 280* 144*  ALT 428* 304* 219*  ALKPHOS 127* 143* 93  BILITOT 0.5 0.5 0.5  PROT 6.7 6.9 5.7*  ALBUMIN 2.5* 2.5* 2.1*   Recent Labs  Lab 03/12/20 2013  LIPASE 19   No results for input(s): AMMONIA in the last 168 hours. Coagulation Profile: Recent Labs  Lab 03/13/20 0747  INR 1.3*   Cardiac Enzymes: No results for  input(s): CKTOTAL, CKMB, CKMBINDEX, TROPONINI in the last 168 hours. BNP (last 3 results) No results for input(s): PROBNP in the last 8760 hours. HbA1C: Recent Labs    03/13/20 1612  HGBA1C 8.3*   CBG: Recent Labs  Lab 03/13/20 1503 03/13/20 1651 03/13/20 1841 03/13/20 2109 03/14/20 0616  GLUCAP 68* 67* 134* 160* 153*   Lipid Profile: No results for input(s): CHOL, HDL, LDLCALC, TRIG, CHOLHDL, LDLDIRECT in the last 72 hours. Thyroid Function Tests: Recent Labs    03/12/20 2015  TSH 0.892   Anemia Panel: Recent Labs    03/13/20 1612  VITAMINB12 757  FOLATE 16.4  TIBC 188*  IRON 23*  RETICCTPCT 1.8   Sepsis Labs: Recent Labs  Lab 03/12/20 2013  LATICACIDVEN 1.8    Recent Results (from the past 240 hour(s))  SARS CORONAVIRUS 2 (TAT 6-24 HRS) Nasopharyngeal Nasopharyngeal Swab     Status: None   Collection Time: 03/08/20 11:29 AM   Specimen: Nasopharyngeal Swab  Result Value Ref Range Status   SARS Coronavirus 2 NEGATIVE NEGATIVE Final    Comment: (NOTE) SARS-CoV-2 target nucleic acids are NOT DETECTED.  The SARS-CoV-2 RNA is generally detectable in upper and lower respiratory specimens during the acute phase of infection. Negative results do not preclude SARS-CoV-2 infection, do not rule out co-infections with other pathogens, and should not be used as the sole basis for treatment or other patient management decisions. Negative results must be combined with clinical observations, patient history, and epidemiological information. The expected result is Negative.  Fact Sheet for Patients: SugarRoll.be  Fact Sheet for Healthcare Providers: https://www.[employer] [place].com/  This test is not yet approved or cleared by the [place] FDA and  has been authorized for detection and/or diagnosis of SARS-CoV-2 by FDA under an Emergency Use Authorization (EUA). This EUA will remain  in effect (meaning this test can be  used) for the duration of the COVID-19 declaration under Se ction 564(b)(1) of the Act, 21 U.S.C. section 360bbb-3(b)(1), unless the authorization is terminated or revoked sooner.  Performed at [facility], [place] [address] [place], [place] [address]   SARS Coronavirus 2 by RT PCR (hospital order, performed in [facility] hospital lab) Nasopharyngeal Nasopharyngeal Swab     Status: None   Collection Time: 03/12/20 10:21 PM   Specimen: Nasopharyngeal Swab  Result Value Ref Range Status   SARS Coronavirus 2 NEGATIVE NEGATIVE Final    Comment: (NOTE) SARS-CoV-2 target nucleic acids are NOT DETECTED.  The SARS-CoV-2 RNA is generally detectable in upper and lower respiratory specimens during the acute phase of infection. The lowest concentration of SARS-CoV-2 viral copies this assay can  detect is 250 copies / mL. A negative result does not preclude SARS-CoV-2 infection and should not be used as the sole basis for treatment or other patient management decisions.  A negative result may occur with improper specimen collection / handling, submission of specimen other than nasopharyngeal swab, presence of viral mutation(s) within the areas targeted by this assay, and inadequate number of viral copies (<250 copies / mL). A negative result must be combined with clinical observations, patient history, and epidemiological information.  Fact Sheet for Patients:   StrictlyIdeas.no  Fact Sheet for Healthcare Providers: BankingDealers.co.za  This test is not yet approved or  cleared by the [place] FDA and has been authorized for detection and/or diagnosis of SARS-CoV-2 by FDA under an Emergency Use Authorization (EUA).  This EUA will remain in effect (meaning this test can be used) for the duration of the COVID-19 declaration under Section 564(b)(1) of the Act, 21 U.S.C. section 360bbb-3(b)(1), unless the authorization is terminated  or revoked sooner.  Performed at [facility], [place] [address], [place], [place] [address]          Radiology Studies: DG Chest 2 View  Result Date: 03/12/2020 CLINICAL DATA:  Short of breath EXAM: CHEST - 2 VIEW COMPARISON:  CT chest 12/14/2016 FINDINGS: Pulmonary vascular congestion with increased interstitial markings compatible with edema. Small bilateral effusions and bibasilar atelectasis Right paratracheal soft tissue mass corresponding to substernal goiter as noted on CT. IMPRESSION: Congestive heart failure with mild interstitial edema and bilateral pleural effusions. Bibasilar atelectasis Substernal goiter in the right paratracheal region. Electronically Signed   By: Franchot Gallo M.D.   On: 03/12/2020 15:16   CT Angio Chest PE W and/or Wo Contrast  Result Date: 03/12/2020 CLINICAL DATA:  Shortness of breath transmetatarsal amputation yesterday. EXAM: CT ANGIOGRAPHY CHEST WITH CONTRAST TECHNIQUE: Multidetector CT imaging of the chest was performed using the standard protocol during bolus administration of intravenous contrast. Multiplanar CT image reconstructions and MIPs were obtained to evaluate the vascular anatomy. Performed in conjunction with CT of the abdomen/pelvis, reported separately. CONTRAST:  112mL OMNIPAQUE IOHEXOL 350 MG/ML SOLN COMPARISON:  Radiograph earlier today. FINDINGS: Cardiovascular: There are no filling defects within the pulmonary arteries to suggest pulmonary embolus. Aortic atherosclerosis without aortic aneurysm. Mild multi chamber cardiomegaly. Coronary artery calcifications. No pericardial effusion. Mediastinum/Nodes: Thyroid goiter with substernal extension on the right causing mild leftward mass effect on the trachea and esophagus. This is stable from prior exams. This has been evaluated on previous imaging. (ref: J Am Coll Radiol. 2015 Feb;12(2): 143-50).Prior thyroid ultrasound 09/13/2017 multiple small mediastinal lymph nodes are not enlarged by  size criteria. No esophageal wall thickening. Lungs/Pleura: Moderate bilateral pleural effusions with compressive atelectasis. Mild smooth septal thickening consistent with pulmonary edema. No pulmonary mass or suspicious nodule. Upper Abdomen: Assessed on concurrent abdominal CT, reported separately. Musculoskeletal: Scoliosis and multilevel degenerative change in the spine. There are no acute or suspicious osseous abnormalities. Review of the MIP images confirms the above findings. IMPRESSION: 1. No pulmonary embolus. 2. CHF. Moderate bilateral pleural effusions with compressive atelectasis. Mild pulmonary edema. 3. Thyroid goiter with substernal extension on the right causing mild leftward mass effect on the trachea and esophagus. This is stable from prior exams. This has been evaluated on previous imaging. (ref: J Am Coll Radiol. 2015 Feb;12(2): 143-50). Aortic Atherosclerosis (ICD10-I70.0). Electronically Signed   By: Keith Rake M.D.   On: 03/12/2020 21:47   CT ABDOMEN PELVIS W CONTRAST  Result Date: 03/12/2020  CLINICAL DATA:  Abdominal pain.  Elevated LFTs. EXAM: CT ABDOMEN AND PELVIS WITH CONTRAST TECHNIQUE: Multidetector CT imaging of the abdomen and pelvis was performed using the standard protocol following bolus administration of intravenous contrast. Performed in conjunction with CT of the chest, reported separately. CONTRAST:  154mL OMNIPAQUE IOHEXOL 350 MG/ML SOLN COMPARISON:  Included portion from chest CT 11/26/2016. FINDINGS: Lower chest: Assessed on concurrent chest CTA. Moderate pleural effusions and compressive atelectasis. Hepatobiliary: No focal liver abnormality is seen. Diffusely decreased hepatic density consistent with steatosis. Status post cholecystectomy. No biliary dilatation. Pancreas: Parenchymal atrophy. No ductal dilatation or inflammation. Minimal calcifications in the pancreatic head. Spleen: Normal in size without focal abnormality. Adrenals/Urinary Tract: Normal adrenal  glands. No hydronephrosis. There are lobulated renal contours, left greater than right. Homogeneous renal enhancement with symmetric excretion on delayed phase imaging. Subcentimeter low-density in the lower right kidney is too small to characterize but likely small cyst. Urinary bladder is physiologically distended without wall thickening. Stomach/Bowel: There is a posterior gastric diverticulum that is unchanged from prior chest CT 12/14/2016. This contains intraluminal fluid. No adjacent fat stranding. Stomach is otherwise decompressed. Small duodenal diverticulum arising from the fourth portion. No inflammatory change. Small bowel is unremarkable without obstruction, inflammation, or obvious wall thickening. Lipomatous hypertrophy of the ileocecal valve versus small cecal lipoma, nonobstructing. Appendix not well visualized. No appendicitis. Multifocal and diffuse diverticulosis including the right colon where there is mild fat stranding adjacent to a diverticulum in the ascending colon, series 5, image 47/48. No other colonic inflammation. No colonic wall thickening. Vascular/Lymphatic: Aortic atherosclerosis. No aortic aneurysm. Patent portal vein. Left mesenteric calcification consistent with calcified lymph nodes. No enlarged lymph nodes in the abdomen or pelvis. Reproductive: Status post hysterectomy. No adnexal masses. Other: Trace free fluid in the pelvis. No upper abdominal ascites. No free air. No intra-abdominal abscess. Tiny fat containing umbilical hernia. Musculoskeletal: Diffuse degenerative change in the spine with scoliosis. Mild L5 compression fracture that appears chronic. Degenerative change of the hips and pubic symphysis. No focal bone lesion. IMPRESSION: 1. Mild acute uncomplicated diverticulitis of the ascending colon. 2. Advanced multifocal colonic diverticular disease throughout the entire colon. 3. Hepatic steatosis. 4. Posterior gastric diverticulum, unchanged from prior chest CT  12/14/2016. This contains intraluminal fluid. No adjacent fat stranding. 5. Additional chronic findings as described. Aortic Atherosclerosis (ICD10-I70.0). Electronically Signed   By: Keith Rake M.D.   On: 03/12/2020 21:55   ECHOCARDIOGRAM COMPLETE  Result Date: 03/13/2020    ECHOCARDIOGRAM REPORT   Patient Name:   [name] Date of Exam: 03/13/2020 Medical Rec #:  [id number]    Height:       68.0 in Accession #:    [id number]   Weight:       176.4 lb Date of Birth:  May 19, 1932   BSA:          1.937 m Patient Age:    82 years     BP:           143/56 mmHg Patient Gender: F            HR:           74 bpm. Exam Location:  Inpatient Procedure: 2D Echo, Cardiac Doppler and Color Doppler Indications:    I50.30* Unspecified diastolic (congestive) heart failure  History:        Patient has no prior history of Echocardiogram examinations.  CHF, CAD, Mitral Valve Disease; Risk Factors:Hypertension,                 Dyslipidemia and Diabetes. Pulmonary edema.  Sonographer:    Roseanna Rainbow RDCS Referring Phys: [id number] [place]  Sonographer Comments: Technically difficult study due to poor echo windows and suboptimal parasternal window. IMPRESSIONS  1. Left ventricular ejection fraction, by estimation, is 45 to 50%. The left ventricle has mildly decreased function. The left ventricle demonstrates global hypokinesis. There is mild concentric left ventricular hypertrophy. Left ventricular diastolic function could not be evaluated.  2. Right ventricular systolic function is normal. The right ventricular size is normal. Tricuspid regurgitation signal is inadequate for assessing PA pressure.  3. MVA by VTI 1.50 cm2. The mitral valve is degenerative. Moderate mitral valve regurgitation. Moderate mitral stenosis. The mean mitral valve gradient is 4.0 mmHg with average heart rate of 79 bpm.  4. The aortic valve is tricuspid. Aortic valve regurgitation is not visualized. Mild to moderate aortic valve  sclerosis/calcification is present, without any evidence of aortic stenosis.  5. The inferior vena cava is dilated in size with >50% respiratory variability, suggesting right atrial pressure of 8 mmHg. Comparison(s): Prior images unable to be directly viewed, comparison made by report only. No significant change from prior study. 2014 EF 40-45% with inferior HK. Current study has EF 45-50% with global HK. FINDINGS  Left Ventricle: Left ventricular ejection fraction, by estimation, is 45 to 50%. The left ventricle has mildly decreased function. The left ventricle demonstrates global hypokinesis. The left ventricular internal cavity size was normal in size. There is  mild concentric left ventricular hypertrophy. Abnormal (paradoxical) septal motion, consistent with left bundle branch block. Left ventricular diastolic function could not be evaluated due to mitral annular calcification (moderate or greater). Left ventricular diastolic function could not be evaluated. Right Ventricle: The right ventricular size is normal. No increase in right ventricular wall thickness. Right ventricular systolic function is normal. Tricuspid regurgitation signal is inadequate for assessing PA pressure. Left Atrium: Left atrial size was normal in size. Right Atrium: Right atrial size was normal in size. Pericardium: Trivial pericardial effusion is present. Presence of pericardial fat pad. Mitral Valve: MVA by VTI 1.50 cm2. The mitral valve is degenerative in appearance. There is moderate calcification of the anterior and posterior mitral valve leaflet(s). Moderate to severe mitral annular calcification. Moderate mitral valve regurgitation. Moderate mitral valve stenosis. MV peak gradient, 13.7 mmHg. The mean mitral valve gradient is 4.0 mmHg with average heart rate of 79 bpm. Tricuspid Valve: The tricuspid valve is grossly normal. Tricuspid valve regurgitation is not demonstrated. No evidence of tricuspid stenosis. Aortic Valve: The  aortic valve is tricuspid. Aortic valve regurgitation is not visualized. Mild to moderate aortic valve sclerosis/calcification is present, without any evidence of aortic stenosis. There is moderate calcification of the aortic valve. Aortic valve mean gradient measures 4.4 mmHg. Aortic valve peak gradient measures 9.0 mmHg. Aortic valve area, by VTI measures 2.20 cm. Pulmonic Valve: The pulmonic valve was grossly normal. Pulmonic valve regurgitation is not visualized. No evidence of pulmonic stenosis. Aorta: The aortic root and ascending aorta are structurally normal, with no evidence of dilitation. Venous: The inferior vena cava is dilated in size with greater than 50% respiratory variability, suggesting right atrial pressure of 8 mmHg. IAS/Shunts: The atrial septum is grossly normal.  LEFT VENTRICLE PLAX 2D LVIDd:         5.10 cm      Diastology LVIDs:  3.60 cm      LV e' lateral:   6.74 cm/s LV PW:         1.80 cm      LV E/e' lateral: 26.2 LV IVS:        1.40 cm      LV e' medial:    3.81 cm/s LVOT diam:     2.00 cm      LV E/e' medial:  46.5 LV SV:         72 LV SV Index:   37 LVOT Area:     3.14 cm  LV Volumes (MOD) LV vol d, MOD A2C: 108.0 ml LV vol d, MOD A4C: 84.5 ml LV vol s, MOD A2C: 62.2 ml LV vol s, MOD A4C: 47.3 ml LV SV MOD A2C:     45.8 ml LV SV MOD A4C:     84.5 ml LV SV MOD BP:      41.2 ml RIGHT VENTRICLE            IVC RV S prime:     7.72 cm/s  IVC diam: 2.30 cm TAPSE (M-mode): 1.8 cm LEFT ATRIUM             Index       RIGHT ATRIUM          Index LA diam:        3.80 cm 1.96 cm/m  RA Area:     9.50 cm LA Vol (A2C):   58.9 ml 30.40 ml/m RA Volume:   18.90 ml 9.76 ml/m LA Vol (A4C):   46.5 ml 24.00 ml/m LA Biplane Vol: 57.9 ml 29.89 ml/m  AORTIC VALVE AV Area (Vmax):    2.15 cm AV Area (Vmean):   1.94 cm AV Area (VTI):     2.20 cm AV Vmax:           150.40 cm/s AV Vmean:          98.664 cm/s AV VTI:            0.327 m AV Peak Grad:      9.0 mmHg AV Mean Grad:      4.4 mmHg LVOT  Vmax:         103.00 cm/s LVOT Vmean:        61.000 cm/s LVOT VTI:          0.229 m LVOT/AV VTI ratio: 0.70  AORTA Ao Root diam: 3.50 cm Ao Asc diam:  3.00 cm MITRAL VALVE MV Area (PHT): 3.89 cm      SHUNTS MV Area VTI:   1.50 cm      Systemic VTI:  0.23 m MV Peak grad:  13.7 mmHg     Systemic Diam: 2.00 cm MV Mean grad:  4.0 mmHg MV Vmax:       1.85 m/s MV Vmean:      94.2 cm/s MV VTI:        0.48 m MV Decel Time: 195 msec MR Peak grad:    126.3 mmHg MR Mean grad:    82.0 mmHg MR Vmax:         562.00 cm/s MR Vmean:        425.0 cm/s MR PISA:         0.25 cm MR PISA Eff ROA: 2 mm MR PISA Radius:  0.20 cm MV E velocity: 177.00 cm/s MV A velocity: 154.00 cm/s MV E/A ratio:  1.15 Eleonore Chiquito MD Electronically signed by Eleonore Chiquito MD  Signature Date/Time: 03/13/2020/12:37:58 PM    Final         Scheduled Meds: . amLODipine  10 mg Oral Daily  . atorvastatin  20 mg Oral Daily  . carvedilol  25 mg Oral BID WC  . clopidogrel  75 mg Oral Daily  . doxycycline  100 mg Oral BID  . furosemide  40 mg Intravenous BID  . insulin aspart  0-15 Units Subcutaneous TID AC & HS  . insulin aspart  10 Units Subcutaneous TID WC  . insulin glargine  35 Units Subcutaneous QPM  . lisinopril  20 mg Oral QPM  . PARoxetine  20 mg Oral Daily  . sodium chloride flush  3 mL Intravenous Q12H   Continuous Infusions: . sodium chloride       LOS: 1 day     Georgette Shell, MD  03/14/2020, 11:18 AM

## 2020-03-14 NOTE — Plan of Care (Signed)

## 2020-03-14 NOTE — Consult Note (Addendum)
Cardiology Consultation:   Patient ID: [name] MRN: [id number]; DOB: [birth year]  Admit date: 03/12/2020 Date of Consult: 03/14/2020  Primary Care Provider: Marton Redwood, MD [facility] Cardiologist: Larae Grooms, MD  [facility] Electrophysiologist:  None    Patient Profile:   [name] is a 86 y.o. female with a hx of CAD, PAD, DM, thyroid mass with tracheal deviation, and recent toe amputation who is being seen today for the evaluation of heart failure at the request of Dr. Rodena Piety.  History of Present Illness:   [name] has a history of CAD with DES to Cx in 07/2012 and PTCA/stent to OM2. CTO of RCA with collaterals was medically managed. EF at that time was estimated at 40-45%. She is also followed by [facility] for PAD and is s/p atherectomy and angioplasty of ostial left peroneal artery and DES of tandem left SFA lesions (04/2018). Left SFA stent and repeat PTA of peroneal in 08/2018 for a nonhealing wound. She suffered an ankle fracture with subsequent wound and was seen 03/09/20 by Dr. Irish Lack for preop clearance for toe amputation. She was cleared for surgery, which occurred on 03/11/20 (right foot transmetatarsal amputation). Postop recovery was complicated by hypotension requiring IVF resuscitation. BP improved and she was discharged later than same evening. At home, evening of 8/26 and morning of 03/12/20 she experienced worsening dyspnea worse with minimal exertion causing her to present to [facility]. Pt also reported worsening orthopnea and PND overnight. In the ER, she was hypoxic in the 4s. CTA negative for PE but showed pulmonary edema. BNP 1584. IV lasix started. Cardiology was called who recommended echo and evaluation the next day.   BNP 1584 HS troponin 238   Echo showed EF of 45-50%, moderate MR - largely unchanged, if not improved, from prior.   On my interview, she denies chest pain. Dyspnea is improved. She is very frustrated at being discharged on 03/11/20. She  is unsure what her dry weight is - reviewed daily weights and PRN lasix.    Past Medical History:  Diagnosis Date  . Anxiety   . CAD (coronary artery disease)   . Cellulitis 10/2015   LEFT FOOT  . Complication of anesthesia   . Coronary artery disease   . Diabetes mellitus without complication (HCC)    insulin dependent  . GERD (gastroesophageal reflux disease)   . Hypertension   . Hypothyroidism   . Neuromuscular disorder (HCC)    muscle cramps to lower extremities  . Other primary cardiomyopathies   . PONV (postoperative nausea and vomiting)   . Shortness of breath     Past Surgical History:  Procedure Laterality Date  . ABDOMINAL AORTOGRAM N/A 09/03/2018   Procedure: ABDOMINAL AORTOGRAM;  Surgeon: Serafina Mitchell, MD;  Location: [place] [facility];  Service: Cardiovascular;  Laterality: N/A;  . ABDOMINAL AORTOGRAM W/LOWER EXTREMITY N/A 04/16/2018   Procedure: ABDOMINAL AORTOGRAM W/LOWER EXTREMITY;  Surgeon: Serafina Mitchell, MD;  Location: [place] [facility];  Service: Cardiovascular;  Laterality: N/A;  unilateral  . ABDOMINAL HYSTERECTOMY    . AMPUTATION Left 12/03/2018   Procedure: Left 3rd ray amputation;  Surgeon: Wylene Simmer, MD;  Location: [place];  Service: Orthopedics;  Laterality: Left;  81min  . CHOLECYSTECTOMY    . CORONARY ANGIOPLASTY WITH STENT PLACEMENT  08/09/2011   DES  to mid circumflex  . I & D EXTREMITY Left 10/29/2015   Procedure: Irrigation and Debridement Left Foot;  Surgeon: Newt Minion,  MD;  Location: [place];  Service: Orthopedics;  Laterality: Left;  . LEFT HEART CATHETERIZATION WITH CORONARY ANGIOGRAM N/A 08/08/2012   Procedure: LEFT HEART CATHETERIZATION WITH CORONARY ANGIOGRAM;  Surgeon: [name], MD;  Location: [facility] CATH LAB;  Service: Cardiovascular;  Laterality: N/A;  . PERIPHERAL VASCULAR ATHERECTOMY  04/16/2018   Procedure: PERIPHERAL VASCULAR ATHERECTOMY;  Surgeon: Serafina Mitchell, MD;  Location: [place] [facility];   Service: Cardiovascular;;  lt. Peroneal  . PERIPHERAL VASCULAR BALLOON ANGIOPLASTY  04/16/2018   Procedure: PERIPHERAL VASCULAR BALLOON ANGIOPLASTY;  Surgeon: Serafina Mitchell, MD;  Location: [place] [facility];  Service: Cardiovascular;;  lt. sfa and AT  . PERIPHERAL VASCULAR BALLOON ANGIOPLASTY Left 09/03/2018   Procedure: PERIPHERAL VASCULAR BALLOON ANGIOPLASTY;  Surgeon: Serafina Mitchell, MD;  Location: [place] [facility];  Service: Cardiovascular;  Laterality: Left;  TP TRUNK  . PERIPHERAL VASCULAR CATHETERIZATION N/A 12/30/2014   Procedure: Lower Extremity Angiography;  Surgeon: Wellington Hampshire, MD;  Location: [place] [facility];  Service: Cardiovascular;  Laterality: N/A;  . PERIPHERAL VASCULAR INTERVENTION Left 09/03/2018   Procedure: PERIPHERAL VASCULAR INTERVENTION;  Surgeon: Serafina Mitchell, MD;  Location: [place] [facility];  Service: Cardiovascular;  Laterality: Left;  SFA STENT   . TENDON RELEASE Right 03/11/2020   Procedure: Heel Cord Lengthening;  Surgeon: Wylene Simmer, MD;  Location: [place];  Service: Orthopedics;  Laterality: Right;  . THYROID SURGERY     radioactive iodine   . TONSILLECTOMY    . TRANSMETATARSAL AMPUTATION Right 03/11/2020   Procedure: Right foot transmetatarsal amputation;  Surgeon: Wylene Simmer, MD;  Location: [place];  Service: Orthopedics;  Laterality: Right;     Home Medications:  Prior to Admission medications   Medication Sig Start Date End Date Taking? Authorizing Provider  amLODipine (NORVASC) 10 MG tablet TAKE 1 TABLET BY MOUTH ONCE DAILY 02/27/20  Yes [name], MD  aspirin EC 325 MG tablet Take 325 mg by mouth at bedtime.    Yes [provider]  atorvastatin (LIPITOR) 20 MG tablet Take 1 tablet (20 mg total) by mouth daily. Please keep upcoming appt in July with Dr. Irish Lack before anymore refills. Thank you 01/05/20  Yes [name], MD  carvedilol (COREG) 25 MG tablet Take 1 tablet  (25 mg total) by mouth 2 (two) times daily with a meal. *Please call and schedule an appointment with Dr Fletcher Anon* 07/18/16  Yes Wellington Hampshire, MD  cholecalciferol (VITAMIN D) 1000 units tablet Take 2,000 Units by mouth daily.    Yes [provider]  Coenzyme Q10 (CO Q 10) 100 MG CAPS Take 1 capsule by mouth daily.    Yes [provider]  doxycycline (ADOXA) 100 MG tablet Take 1 tablet (100 mg total) by mouth 2 (two) times daily for 14 days. 03/11/20 03/25/20 Yes Corky Sing, PA-C  ergocalciferol (VITAMIN D2) 1.25 MG (50000 UT) capsule Take 50,000 Units by mouth once a week.   Yes [provider]  Insulin Glargine (BASAGLAR KWIKPEN [place]) Inject 70 Units into the skin every evening.   Yes [provider]  insulin lispro (HUMALOG) 100 UNIT/ML injection Inject 10 units with breakfast and 16 units with lunch and dinner,   Yes [provider]  lisinopril (PRINIVIL,ZESTRIL) 20 MG tablet Take 20 mg by mouth every evening.  10/29/15  Yes [provider]  nitroGLYCERIN (NITROSTAT) 0.4 MG SL tablet PLACE 1 TABLET UNDER THE TONGUE EVERY 5  MINUTES AS NEEDED FOR CHEST PAIN 09/19/19  Yes [name], MD  pantoprazole (PROTONIX) 40 MG tablet TAKE 1 TABLET BY MOUTH ONCE DAILY AT  6AM. Please keep upcoming appt in July with Dr. Irish Lack before anymore refills. Thank you Patient taking differently: Take 40 mg by mouth daily as needed (heartburn). TAKE 1 TABLET BY MOUTH ONCE DAILY AT  6AM. Please keep upcoming appt in July with Dr. Irish Lack before anymore refills. Thank you 01/02/20  Yes [name], MD  PARoxetine (PAXIL) 20 MG tablet Take 20 mg by mouth daily.    Yes [provider]  traMADol (ULTRAM) 50 MG tablet Take 1 tablet (50 mg total) by mouth every 6 (six) hours as needed. Patient taking differently: Take 50 mg by mouth every 6 (six) hours as needed for moderate pain or severe pain.  03/11/20 03/11/21 Yes Corky Sing, PA-C    clopidogrel (PLAVIX) 75 MG tablet TAKE 1 TABLET BY MOUTH ONCE DAILY 05/04/19   Serafina Mitchell, MD  FREESTYLE LITE test strip USE TO CHECK BS TWICE DAILY  AND PRN 02/10/19   [provider]    Inpatient Medications: Scheduled Meds: . [START ON 03/15/2020] amLODipine  2.5 mg Oral Daily  . atorvastatin  20 mg Oral Daily  . carvedilol  25 mg Oral BID WC  . clopidogrel  75 mg Oral Daily  . doxycycline  100 mg Oral BID  . furosemide  60 mg Intravenous Q6H  . insulin aspart  0-15 Units Subcutaneous TID AC & HS  . insulin aspart  10 Units Subcutaneous TID WC  . insulin glargine  35 Units Subcutaneous QPM  . lisinopril  20 mg Oral QPM  . PARoxetine  20 mg Oral Daily  . sodium chloride flush  3 mL Intravenous Q12H   Continuous Infusions: . sodium chloride     PRN Meds: sodium chloride, acetaminophen, nitroGLYCERIN, ondansetron (ZOFRAN) IV, polyethylene glycol, sodium chloride flush, traMADol  Allergies:    Allergies  Allergen Reactions  . Sulfa Antibiotics Nausea And Vomiting    Severe vomiting.     Social History:   Social History   Socioeconomic History  . Marital status: Married    Spouse name: Not on file  . Number of children: Not on file  . Years of education: Not on file  . Highest education level: Not on file  Occupational History  . Not on file  Tobacco Use  . Smoking status: Former Smoker    Quit date: 11/28/1960    Years since quitting: 59.3  . Smokeless tobacco: Never Used  Vaping Use  . Vaping Use: Never used  Substance and Sexual Activity  . Alcohol use: Yes    Alcohol/week: 0.0 standard drinks    Comment: rare  . Drug use: No  . Sexual activity: Not on file  Other Topics Concern  . Not on file  Social History Narrative  . Not on file   Social Determinants of Health   Financial Resource Strain:   . Difficulty of Paying Living Expenses: Not on file  Food Insecurity: No Food Insecurity  . Worried About Charity fundraiser in the Last Year:  Never true  . Ran Out of Food in the Last Year: Never true  Transportation Needs: No Transportation Needs  . Lack of Transportation (Medical): No  . Lack of Transportation (Non-Medical): No  Physical Activity:   . Days of Exercise per Week: Not on file  . Minutes of Exercise per  Session: Not on file  Stress:   . Feeling of Stress : Not on file  Social Connections:   . Frequency of Communication with Friends and Family: Not on file  . Frequency of Social Gatherings with Friends and Family: Not on file  . Attends Religious Services: Not on file  . Active Member of Clubs or Organizations: Not on file  . Attends Archivist Meetings: Not on file  . Marital Status: Not on file  Intimate Partner Violence:   . Fear of Current or Ex-Partner: Not on file  . Emotionally Abused: Not on file  . Physically Abused: Not on file  . Sexually Abused: Not on file    Family History:    Family History  Problem Relation Age of Onset  . Heart disease Father   . Heart attack Father   . Diabetes Sister   . Heart disease Son        before age 36  . Heart attack 27        84yr old  . Sudden death Grandchild   . Hypertension Neg Hx      ROS:  Please see the history of present illness.   All other ROS reviewed and negative.     Physical Exam/Data:   Vitals:   03/14/20 0041 03/14/20 0416 03/14/20 0723 03/14/20 1208  BP: 121/82 138/73 (!) 140/58 136/65  Pulse: 62 65 66 62  Resp: 18 19 18 18   Temp: 97.6 F (36.4 C) 97.8 F (36.6 C) 98.2 F (36.8 C) 97.6 F (36.4 C)  TempSrc: Oral Oral Oral Oral  SpO2: 93% 92% 94% 96%  Weight:  80.3 kg    Height:        Intake/Output Summary (Last 24 hours) at 03/14/2020 1507 Last data filed at 03/14/2020 0840 Gross per 24 hour  Intake 720 ml  Output 450 ml  Net 270 ml   Last 3 Weights 03/14/2020 03/13/2020 03/13/2020  Weight (lbs) 177 lb 0.5 oz 176 lb 5.9 oz 175 lb  Weight (kg) 80.3 kg 80 kg 79.379 kg     Body mass index is 26.92  kg/m.  General:  Obese female in NAD HEENT: normal Neck: no JVD, goiter Cardiac:  normal S1, S2; RRR; no murmur  Lungs:  Respirations unlabored, diminished in bases B Abd: soft, nontender, no hepatomegaly  Ext: no edema on LLE, right LE in boot s/p amputation Skin: warm and dry  Neuro:  CNs 2-12 intact, no focal abnormalities noted Psych:  Normal affect   EKG:  The EKG was personally reviewed and demonstrates:  sinus 75, LBBB Telemetry:  Telemetry was personally reviewed and demonstrates:  Sinus rhythm to sinus bradycardia with HR 50-60s  Relevant CV Studies:  Echo 03/13/20: 1. Left ventricular ejection fraction, by estimation, is 45 to 50%. The  left ventricle has mildly decreased function. The left ventricle  demonstrates global hypokinesis. There is mild concentric left ventricular  hypertrophy. Left ventricular diastolic  function could not be evaluated.  2. Right ventricular systolic function is normal. The right ventricular  size is normal. Tricuspid regurgitation signal is inadequate for assessing  PA pressure.  3. MVA by VTI 1.50 cm2. The mitral valve is degenerative. Moderate mitral  valve regurgitation. Moderate mitral stenosis. The mean mitral valve  gradient is 4.0 mmHg with average heart rate of 79 bpm.  4. The aortic valve is tricuspid. Aortic valve regurgitation is not  visualized. Mild to moderate aortic valve sclerosis/calcification is  present, without any  evidence of aortic stenosis.  5. The inferior vena cava is dilated in size with >50% respiratory  variability, suggesting right atrial pressure of 8 mmHg.   Laboratory Data:  High Sensitivity Troponin:   Recent Labs  Lab 03/12/20 2013 03/12/20 2213 03/13/20 0747  TROPONINIHS 238* 293* 224*     Chemistry Recent Labs  Lab 03/12/20 1414 03/13/20 0747 03/14/20 0452  NA 139 141 140  K 4.4 4.0 3.8  CL 102 101 101  CO2 23 27 29   GLUCOSE 215* 176* 150*  BUN 26* 22 27*  CREATININE 0.95 0.66  0.67  CALCIUM 8.6* 9.1 8.5*  GFRNONAA 55* >60 >60  GFRAA >60 >60 >60  ANIONGAP 14 13 10     Recent Labs  Lab 03/12/20 1414 03/13/20 0747 03/14/20 0452  PROT 6.7 6.9 5.7*  ALBUMIN 2.5* 2.5* 2.1*  AST 892* 280* 144*  ALT 428* 304* 219*  ALKPHOS 127* 143* 93  BILITOT 0.5 0.5 0.5   Hematology Recent Labs  Lab 03/12/20 1414 03/12/20 1414 03/13/20 0747 03/13/20 1612 03/14/20 0452  WBC 17.5*  --  15.4*  --  12.2*  RBC 3.11*   < > 3.27* 3.10* 2.99*  HGB 8.2*  --  8.8*  --  7.8*  HCT 27.2*  --  28.6*  --  25.9*  MCV 87.5  --  87.5  --  86.6  MCH 26.4  --  26.9  --  26.1  MCHC 30.1  --  30.8  --  30.1  RDW 15.2  --  15.0  --  15.0  PLT 569*  --  589*  --  505*   < > = values in this interval not displayed.   BNP Recent Labs  Lab 03/12/20 2013  BNP 1,584.9*    DDimer No results for input(s): DDIMER in the last 168 hours.   Radiology/Studies:  DG Chest 2 View  Result Date: 03/12/2020 CLINICAL DATA:  Short of breath EXAM: CHEST - 2 VIEW COMPARISON:  CT chest 12/14/2016 FINDINGS: Pulmonary vascular congestion with increased interstitial markings compatible with edema. Small bilateral effusions and bibasilar atelectasis Right paratracheal soft tissue mass corresponding to substernal goiter as noted on CT. IMPRESSION: Congestive heart failure with mild interstitial edema and bilateral pleural effusions. Bibasilar atelectasis Substernal goiter in the right paratracheal region. Electronically Signed   By: Franchot Gallo M.D.   On: 03/12/2020 15:16   CT Angio Chest PE W and/or Wo Contrast  Result Date: 03/12/2020 CLINICAL DATA:  Shortness of breath transmetatarsal amputation yesterday. EXAM: CT ANGIOGRAPHY CHEST WITH CONTRAST TECHNIQUE: Multidetector CT imaging of the chest was performed using the standard protocol during bolus administration of intravenous contrast. Multiplanar CT image reconstructions and MIPs were obtained to evaluate the vascular anatomy. Performed in conjunction  with CT of the abdomen/pelvis, reported separately. CONTRAST:  117mL OMNIPAQUE IOHEXOL 350 MG/ML SOLN COMPARISON:  Radiograph earlier today. FINDINGS: Cardiovascular: There are no filling defects within the pulmonary arteries to suggest pulmonary embolus. Aortic atherosclerosis without aortic aneurysm. Mild multi chamber cardiomegaly. Coronary artery calcifications. No pericardial effusion. Mediastinum/Nodes: Thyroid goiter with substernal extension on the right causing mild leftward mass effect on the trachea and esophagus. This is stable from prior exams. This has been evaluated on previous imaging. (ref: J Am Coll Radiol. 2015 Feb;12(2): 143-50).Prior thyroid ultrasound 09/13/2017 multiple small mediastinal lymph nodes are not enlarged by size criteria. No esophageal wall thickening. Lungs/Pleura: Moderate bilateral pleural effusions with compressive atelectasis. Mild smooth septal thickening consistent with pulmonary edema. No  pulmonary mass or suspicious nodule. Upper Abdomen: Assessed on concurrent abdominal CT, reported separately. Musculoskeletal: Scoliosis and multilevel degenerative change in the spine. There are no acute or suspicious osseous abnormalities. Review of the MIP images confirms the above findings. IMPRESSION: 1. No pulmonary embolus. 2. CHF. Moderate bilateral pleural effusions with compressive atelectasis. Mild pulmonary edema. 3. Thyroid goiter with substernal extension on the right causing mild leftward mass effect on the trachea and esophagus. This is stable from prior exams. This has been evaluated on previous imaging. (ref: J Am Coll Radiol. 2015 Feb;12(2): 143-50). Aortic Atherosclerosis (ICD10-I70.0). Electronically Signed   By: Keith Rake M.D.   On: 03/12/2020 21:47   CT ABDOMEN PELVIS W CONTRAST  Result Date: 03/12/2020 CLINICAL DATA:  Abdominal pain.  Elevated LFTs. EXAM: CT ABDOMEN AND PELVIS WITH CONTRAST TECHNIQUE: Multidetector CT imaging of the abdomen and pelvis was  performed using the standard protocol following bolus administration of intravenous contrast. Performed in conjunction with CT of the chest, reported separately. CONTRAST:  178mL OMNIPAQUE IOHEXOL 350 MG/ML SOLN COMPARISON:  Included portion from chest CT 11/26/2016. FINDINGS: Lower chest: Assessed on concurrent chest CTA. Moderate pleural effusions and compressive atelectasis. Hepatobiliary: No focal liver abnormality is seen. Diffusely decreased hepatic density consistent with steatosis. Status post cholecystectomy. No biliary dilatation. Pancreas: Parenchymal atrophy. No ductal dilatation or inflammation. Minimal calcifications in the pancreatic head. Spleen: Normal in size without focal abnormality. Adrenals/Urinary Tract: Normal adrenal glands. No hydronephrosis. There are lobulated renal contours, left greater than right. Homogeneous renal enhancement with symmetric excretion on delayed phase imaging. Subcentimeter low-density in the lower right kidney is too small to characterize but likely small cyst. Urinary bladder is physiologically distended without wall thickening. Stomach/Bowel: There is a posterior gastric diverticulum that is unchanged from prior chest CT 12/14/2016. This contains intraluminal fluid. No adjacent fat stranding. Stomach is otherwise decompressed. Small duodenal diverticulum arising from the fourth portion. No inflammatory change. Small bowel is unremarkable without obstruction, inflammation, or obvious wall thickening. Lipomatous hypertrophy of the ileocecal valve versus small cecal lipoma, nonobstructing. Appendix not well visualized. No appendicitis. Multifocal and diffuse diverticulosis including the right colon where there is mild fat stranding adjacent to a diverticulum in the ascending colon, series 5, image 47/48. No other colonic inflammation. No colonic wall thickening. Vascular/Lymphatic: Aortic atherosclerosis. No aortic aneurysm. Patent portal vein. Left mesenteric  calcification consistent with calcified lymph nodes. No enlarged lymph nodes in the abdomen or pelvis. Reproductive: Status post hysterectomy. No adnexal masses. Other: Trace free fluid in the pelvis. No upper abdominal ascites. No free air. No intra-abdominal abscess. Tiny fat containing umbilical hernia. Musculoskeletal: Diffuse degenerative change in the spine with scoliosis. Mild L5 compression fracture that appears chronic. Degenerative change of the hips and pubic symphysis. No focal bone lesion. IMPRESSION: 1. Mild acute uncomplicated diverticulitis of the ascending colon. 2. Advanced multifocal colonic diverticular disease throughout the entire colon. 3. Hepatic steatosis. 4. Posterior gastric diverticulum, unchanged from prior chest CT 12/14/2016. This contains intraluminal fluid. No adjacent fat stranding. 5. Additional chronic findings as described. Aortic Atherosclerosis (ICD10-I70.0). Electronically Signed   By: Keith Rake M.D.   On: 03/12/2020 21:55   ECHOCARDIOGRAM COMPLETE  Result Date: 03/13/2020    ECHOCARDIOGRAM REPORT   Patient Name:   RIVKY CLENDENNING Date of Exam: 03/13/2020 Medical Rec #:  [id number]    Height:       68.0 in Accession #:    [id number]   Weight:       176.4  lb Date of Birth:  1932-02-23   BSA:          1.937 m Patient Age:    14 years     BP:           143/56 mmHg Patient Gender: F            HR:           74 bpm. Exam Location:  Inpatient Procedure: 2D Echo, Cardiac Doppler and Color Doppler Indications:    I50.30* Unspecified diastolic (congestive) heart failure  History:        Patient has no prior history of Echocardiogram examinations.                 CHF, CAD, Mitral Valve Disease; Risk Factors:Hypertension,                 Dyslipidemia and Diabetes. Pulmonary edema.  Sonographer:    Roseanna Rainbow RDCS Referring Phys: [id number] [place]  Sonographer Comments: Technically difficult study due to poor echo windows and suboptimal parasternal window. IMPRESSIONS  1.  Left ventricular ejection fraction, by estimation, is 45 to 50%. The left ventricle has mildly decreased function. The left ventricle demonstrates global hypokinesis. There is mild concentric left ventricular hypertrophy. Left ventricular diastolic function could not be evaluated.  2. Right ventricular systolic function is normal. The right ventricular size is normal. Tricuspid regurgitation signal is inadequate for assessing PA pressure.  3. MVA by VTI 1.50 cm2. The mitral valve is degenerative. Moderate mitral valve regurgitation. Moderate mitral stenosis. The mean mitral valve gradient is 4.0 mmHg with average heart rate of 79 bpm.  4. The aortic valve is tricuspid. Aortic valve regurgitation is not visualized. Mild to moderate aortic valve sclerosis/calcification is present, without any evidence of aortic stenosis.  5. The inferior vena cava is dilated in size with >50% respiratory variability, suggesting right atrial pressure of 8 mmHg. Comparison(s): Prior images unable to be directly viewed, comparison made by report only. No significant change from prior study. 2014 EF 40-45% with inferior HK. Current study has EF 45-50% with global HK. FINDINGS  Left Ventricle: Left ventricular ejection fraction, by estimation, is 45 to 50%. The left ventricle has mildly decreased function. The left ventricle demonstrates global hypokinesis. The left ventricular internal cavity size was normal in size. There is  mild concentric left ventricular hypertrophy. Abnormal (paradoxical) septal motion, consistent with left bundle branch block. Left ventricular diastolic function could not be evaluated due to mitral annular calcification (moderate or greater). Left ventricular diastolic function could not be evaluated. Right Ventricle: The right ventricular size is normal. No increase in right ventricular wall thickness. Right ventricular systolic function is normal. Tricuspid regurgitation signal is inadequate for assessing PA  pressure. Left Atrium: Left atrial size was normal in size. Right Atrium: Right atrial size was normal in size. Pericardium: Trivial pericardial effusion is present. Presence of pericardial fat pad. Mitral Valve: MVA by VTI 1.50 cm2. The mitral valve is degenerative in appearance. There is moderate calcification of the anterior and posterior mitral valve leaflet(s). Moderate to severe mitral annular calcification. Moderate mitral valve regurgitation. Moderate mitral valve stenosis. MV peak gradient, 13.7 mmHg. The mean mitral valve gradient is 4.0 mmHg with average heart rate of 79 bpm. Tricuspid Valve: The tricuspid valve is grossly normal. Tricuspid valve regurgitation is not demonstrated. No evidence of tricuspid stenosis. Aortic Valve: The aortic valve is tricuspid. Aortic valve regurgitation is not visualized. Mild to moderate aortic valve sclerosis/calcification  is present, without any evidence of aortic stenosis. There is moderate calcification of the aortic valve. Aortic valve mean gradient measures 4.4 mmHg. Aortic valve peak gradient measures 9.0 mmHg. Aortic valve area, by VTI measures 2.20 cm. Pulmonic Valve: The pulmonic valve was grossly normal. Pulmonic valve regurgitation is not visualized. No evidence of pulmonic stenosis. Aorta: The aortic root and ascending aorta are structurally normal, with no evidence of dilitation. Venous: The inferior vena cava is dilated in size with greater than 50% respiratory variability, suggesting right atrial pressure of 8 mmHg. IAS/Shunts: The atrial septum is grossly normal.  LEFT VENTRICLE PLAX 2D LVIDd:         5.10 cm      Diastology LVIDs:         3.60 cm      LV e' lateral:   6.74 cm/s LV PW:         1.80 cm      LV E/e' lateral: 26.2 LV IVS:        1.40 cm      LV e' medial:    3.81 cm/s LVOT diam:     2.00 cm      LV E/e' medial:  46.5 LV SV:         72 LV SV Index:   37 LVOT Area:     3.14 cm  LV Volumes (MOD) LV vol d, MOD A2C: 108.0 ml LV vol d, MOD A4C:  84.5 ml LV vol s, MOD A2C: 62.2 ml LV vol s, MOD A4C: 47.3 ml LV SV MOD A2C:     45.8 ml LV SV MOD A4C:     84.5 ml LV SV MOD BP:      41.2 ml RIGHT VENTRICLE            IVC RV S prime:     7.72 cm/s  IVC diam: 2.30 cm TAPSE (M-mode): 1.8 cm LEFT ATRIUM             Index       RIGHT ATRIUM          Index LA diam:        3.80 cm 1.96 cm/m  RA Area:     9.50 cm LA Vol (A2C):   58.9 ml 30.40 ml/m RA Volume:   18.90 ml 9.76 ml/m LA Vol (A4C):   46.5 ml 24.00 ml/m LA Biplane Vol: 57.9 ml 29.89 ml/m  AORTIC VALVE AV Area (Vmax):    2.15 cm AV Area (Vmean):   1.94 cm AV Area (VTI):     2.20 cm AV Vmax:           150.40 cm/s AV Vmean:          98.664 cm/s AV VTI:            0.327 m AV Peak Grad:      9.0 mmHg AV Mean Grad:      4.4 mmHg LVOT Vmax:         103.00 cm/s LVOT Vmean:        61.000 cm/s LVOT VTI:          0.229 m LVOT/AV VTI ratio: 0.70  AORTA Ao Root diam: 3.50 cm Ao Asc diam:  3.00 cm MITRAL VALVE MV Area (PHT): 3.89 cm      SHUNTS MV Area VTI:   1.50 cm      Systemic VTI:  0.23 m MV Peak grad:  13.7 mmHg  Systemic Diam: 2.00 cm MV Mean grad:  4.0 mmHg MV Vmax:       1.85 m/s MV Vmean:      94.2 cm/s MV VTI:        0.48 m MV Decel Time: 195 msec MR Peak grad:    126.3 mmHg MR Mean grad:    82.0 mmHg MR Vmax:         562.00 cm/s MR Vmean:        425.0 cm/s MR PISA:         0.25 cm MR PISA Eff ROA: 2 mm MR PISA Radius:  0.20 cm MV E velocity: 177.00 cm/s MV A velocity: 154.00 cm/s MV E/A ratio:  1.15 Eleonore Chiquito MD Electronically signed by Eleonore Chiquito MD Signature Date/Time: 03/13/2020/12:37:58 PM    Final        TIMI Risk Score for Unstable Angina or Non-ST Elevation MI:   The patient's TIMI risk score is 5, which indicates a 26% risk of all cause mortality, new or recurrent myocardial infarction or need for urgent revascularization in the next 14 days.   New York Heart Association (NYHA) Functional Class NYHA Class III  Assessment and Plan:   New onset congestive heart failure  with EF 45-50% Acute hypoxic respiratory failure Mild MR - echo this admission with EF 45-50% - has been diuresing on 40-60 mg IV lasix  - I&Os incomplete - weight is up 2 lbs - agree with IV diuresis - adjust to 80 mg IV BID x 2 doses, re-evaluate in the AM - strict I&Os - reviewed daily weights with her - consider discharging with 40 mg lasix to use PRN - given leukocytosis, will recheck CXR   Elevated troponin  CAD - DES to OM2, expedition stent to Cx (2014) - hs troponin 238 --> 293 --> 224 - no angina - continue ASA and plavix, statin - do no suspect an ACS process - would recommend increasing statin once LFTs have normalized   HTN - amlodipine was reduced to 2.5 mg by primary - continue BB, lisinopril, and amlodipine - pressures somewhat labile - would increase amlodipine if better pressure control needed   DM - uncontrolled - A1c 8.3% - per primary   PAD - continue ASA, plavix, statin   Elevated LFTs - abdominal CT negative for liver abnormalities - per primary   Anemia - Hb 7.8 - no active bleeding - favor starting iron      For questions or updates, please contact [facility] Please consult [email or web address] for contact info under    Signed, Ledora Bottcher, [place]  03/14/2020 3:07 PM

## 2020-03-15 ENCOUNTER — Encounter (HOSPITAL_COMMUNITY): Payer: Self-pay | Admitting: Internal Medicine

## 2020-03-15 ENCOUNTER — Other Ambulatory Visit: Payer: Self-pay

## 2020-03-15 ENCOUNTER — Inpatient Hospital Stay (HOSPITAL_COMMUNITY): Payer: HMO

## 2020-03-15 DIAGNOSIS — I501 Left ventricular failure: Secondary | ICD-10-CM

## 2020-03-15 LAB — CBC
HCT: 27.3 % — ABNORMAL LOW (ref 36.0–46.0)
Hemoglobin: 8.4 g/dL — ABNORMAL LOW (ref 12.0–15.0)
MCH: 26.8 pg (ref 26.0–34.0)
MCHC: 30.8 g/dL (ref 30.0–36.0)
MCV: 87.2 fL (ref 80.0–100.0)
Platelets: 537 10*3/uL — ABNORMAL HIGH (ref 150–400)
RBC: 3.13 MIL/uL — ABNORMAL LOW (ref 3.87–5.11)
RDW: 15.1 % (ref 11.5–15.5)
WBC: 13.4 10*3/uL — ABNORMAL HIGH (ref 4.0–10.5)
nRBC: 0.1 % (ref 0.0–0.2)

## 2020-03-15 LAB — COMPREHENSIVE METABOLIC PANEL
ALT: 176 U/L — ABNORMAL HIGH (ref 0–44)
AST: 93 U/L — ABNORMAL HIGH (ref 15–41)
Albumin: 2 g/dL — ABNORMAL LOW (ref 3.5–5.0)
Alkaline Phosphatase: 98 U/L (ref 38–126)
Anion gap: 12 (ref 5–15)
BUN: 19 mg/dL (ref 8–23)
CO2: 29 mmol/L (ref 22–32)
Calcium: 8.4 mg/dL — ABNORMAL LOW (ref 8.9–10.3)
Chloride: 100 mmol/L (ref 98–111)
Creatinine, Ser: 0.66 mg/dL (ref 0.44–1.00)
GFR calc Af Amer: >60 mL/min (ref >60)
GFR calc non Af Amer: >60 mL/min (ref >60)
Glucose, Bld: 181 mg/dL — ABNORMAL HIGH (ref 70–99)
Potassium: 3.7 mmol/L (ref 3.5–5.1)
Sodium: 141 mmol/L (ref 135–145)
Total Bilirubin: 0.4 mg/dL (ref 0.3–1.2)
Total Protein: 5.8 g/dL — ABNORMAL LOW (ref 6.5–8.1)

## 2020-03-15 LAB — GLUCOSE, CAPILLARY
Glucose-Capillary: 67 mg/dL — ABNORMAL LOW (ref 70–99)
Glucose-Capillary: 152 mg/dL — ABNORMAL HIGH (ref 70–99)
Glucose-Capillary: 193 mg/dL — ABNORMAL HIGH (ref 70–99)
Glucose-Capillary: 111 mg/dL — ABNORMAL HIGH (ref 70–99)
Glucose-Capillary: 169 mg/dL — ABNORMAL HIGH (ref 70–99)

## 2020-03-15 LAB — PROCALCITONIN: Procalcitonin: <0.1 ng/mL

## 2020-03-15 MED ORDER — INSULIN ASPART 100 UNIT/ML ~~LOC~~ SOLN
0.0000 [IU] | Freq: Three times a day (TID) | SUBCUTANEOUS | Status: DC
  Administered 2020-03-16: 5 [IU] via SUBCUTANEOUS
  Administered 2020-03-17: 3 [IU] via SUBCUTANEOUS
  Administered 2020-03-17: 2 [IU] via SUBCUTANEOUS
  Administered 2020-03-18: 3 [IU] via SUBCUTANEOUS
  Administered 2020-03-18 – 2020-03-19 (×3): 2 [IU] via SUBCUTANEOUS

## 2020-03-15 MED ORDER — POTASSIUM CHLORIDE CRYS ER 20 MEQ PO TBCR
40.0000 meq | EXTENDED_RELEASE_TABLET | Freq: Two times a day (BID) | ORAL | Status: DC
  Administered 2020-03-15 – 2020-03-17 (×5): 40 meq via ORAL
  Filled 2020-03-15 (×5): qty 2

## 2020-03-15 MED ORDER — SODIUM CHLORIDE 0.9 % IV SOLN
2.0000 g | Freq: Two times a day (BID) | INTRAVENOUS | Status: DC
  Administered 2020-03-15 – 2020-03-19 (×9): 2 g via INTRAVENOUS
  Filled 2020-03-15 (×11): qty 2

## 2020-03-15 NOTE — Progress Notes (Signed)
Inpatient Diabetes Program Recommendations  AACE/ADA: New Consensus Statement on Inpatient Glycemic Control (2015)  Target Ranges:  Prepandial:   less than 140 mg/dL      Peak postprandial:   less than 180 mg/dL (1-2 hours)      Critically ill patients:  140 - 180 mg/dL   Lab Results  Component Value Date   GLUCAP 152 (H) 03/15/2020   HGBA1C 8.3 (H) 03/13/2020    Review of Glycemic Control Results for [name], [name] (MRN [id number]) as of 03/15/2020 10:09  Ref. Range 03/14/2020 16:44 03/14/2020 20:51 03/15/2020 02:14 03/15/2020 06:00  Glucose-Capillary Latest Ref Range: 70 - 99 mg/dL 191 (H) 218 (H) 67 (L) 152 (H)   Diabetes history: Type 2 DM Outpatient Diabetes medications: Basaglar 70 units QHS, Humalog 05/02/15 Current orders for Inpatient glycemic control: Lantus 35 units QHS, Novolog 10 units TID, Novolog 0-15 units TID & HS  Inpatient Diabetes Program Recommendations:    Noted mild hypoglycemia of 67 mg/dL following Novolog 5 units for QHS coverage. Please ensure correction is given within one hour of previous CBG to avoid hypoglycemia.   Thanks, Bronson Curb, MSN, RNC-OB Diabetes Coordinator [phone] (8a-5p)

## 2020-03-15 NOTE — Progress Notes (Signed)
Pharmacy Antibiotic Note  [name] is a 86 y.o. female admitted on 03/12/2020 with shortness of breath. Found to have acute cardiogenic pulmonary edema.     Pharmacy has been consulted for Cefepime dosing for pneumonia (HCAP).  Was on oral doxycycline prior to admission which continues. MD noted  increased white blood cell count in spite of being on doxycycline and ongoing shortness of which is not better with diuresing, thus starting her on cefepime.   Plan: Cefepime 2g IV q12h. Crcl 56.8 ml/min Monitor clinical status, renal function and culture results daily.  F/u LOT for doxy , 14 days should be last day on 9/8   Height: 5\' 8"  (172.7 cm) Weight: 79.2 kg (174 lb 9.7 oz) IBW/kg (Calculated) : 63.9  Temp (24hrs), Avg:97.8 F (36.6 C), Min:97.5 F (36.4 C), Max:98.4 F (36.9 C)  Recent Labs  Lab 03/12/20 1414 03/12/20 2013 03/13/20 0747 03/14/20 0452 03/15/20 0501  WBC 17.5*  --  15.4* 12.2* 13.4*  CREATININE 0.95  --  0.66 0.67 0.66  LATICACIDVEN  --  1.8  --   --   --     Estimated Creatinine Clearance: 56.8 mL/min (by C-G formula based on SCr of 0.66 mg/dL).    Allergies  Allergen Reactions  . Sulfa Antibiotics Nausea And Vomiting    Severe vomiting.     Antimicrobials this admission: Cefepime 8/30>> Doxycycline 8/26 (pta)  >>   Dose adjustments this admission:   Microbiology results: 8/28 BCx: ngtd 8/27 Covid: neg  Thank you for allowing pharmacy to be a part of this patient's care. Nicole Cella, RPh Clinical Pharmacist Please check AMION for all [place] phone numbers After 10:00 PM, call [place] [phone] 03/15/2020 11:57 AM

## 2020-03-15 NOTE — TOC Progression Note (Signed)
Transition of Care [facility]) - Progression Note    Patient Details  Name: [name] MRN: [id number] Date of Birth: Oct 22, 1931  [facility] CM/SW Contact  Zenon Mayo, RN Phone Number: 03/15/2020, 4:14 PM  Clinical Narrative:    NCM spoke with patient at bedside, she states she has two caregivers at home who helps her to clean and cook for her .  NCM offered choice for [facility] for CHF management.  She states she will let this NCM know tomorrow what she decides.         Expected Discharge Plan and Services                                                 Social Determinants of Health (SDOH) Interventions    Readmission Risk Interventions No flowsheet data found.

## 2020-03-15 NOTE — Progress Notes (Signed)
PROGRESS NOTE    [name]  ENI:[id number] DOB: 07-Mar-1932 DOA: 03/12/2020 PCP: Marton Redwood, MD   Brief Narrative: HPI per Dr. Cyd Silence 03/13/2020 86 year old female with past medical history of coronary artery disease (cath 2014 with stent to circumflex, CTO of RCA with collaterals), IDDM type II, peripheral vascular disease (S/P arthrectomy and angioplasty of left peroneal and left superficial femoral 04/2018), multiple diabetic foot ulcers, gastroesophageal reflux disease, hypothyroidism, hypertension who presents to [facility] emergency department complaints of shortness of breath.  Patient underwent right foot transmetatarsal amputation on 8/26 performed by Dr. Doran Durand for gangrenous right foot ulcer.  Postoperatively in recovery, patient was found to be hypotensive requiring administration of intravenous fluids.  Patient's blood pressures eventually improved and patient was discharged home later that evening.  During the evening of 8/26 and into the morning on 8/27 the patient developed severe shortness of breath.  Patient states that the shortness of breath is worse with minimal exertion and improved with rest.  Patient complains of associated nonproductive cough.  Patient also complains of associated paroxysmal nocturnal dyspnea and pillow orthopnea throughout the evening.  Patient denies any associated increased abdominal girth or lower extremity edema.  Patient denies associated chest pain.  Patient symptoms continue to worsen until she eventually presented to [facility] emergency department.  Upon evaluation in the emergency room patient was found to be hypoxic with oxygen saturations in the 80s requiring supplemental oxygen therapy.  Chest imaging was performed revealing no evidence of pulmonary embolism on CTA of the chest but evidence of patchy bilateral infiltrates concerning for pulmonary edema.  BNP was additionally found to be markedly elevated at 1584.   Patient was administered 40 mg of intravenous Lasix.  Patient was also found to have a somewhat elevated troponin of 238.  Patient denies chest pain and exhibited no evidence of dynamic ST segment changes.  Case was discussed with Dr. Jeannette Corpus with cardiology who recommended echocardiogram in the morning with formal consultation then.  The hospitalist group was then called to assess the patient for admission to the hospital.   Assessment & Plan:   Principal Problem:   Acute cardiogenic pulmonary edema (HCC) Active Problems:   Coronary artery disease involving native coronary artery of native heart   Essential hypertension   Mixed hyperlipidemia   Severe peripheral arterial disease (HCC)   Diabetic ulcer of right foot (HCC)   Elevated troponin level not due myocardial infarction   GERD without esophagitis   Anemia   Thyroid goiter   Acute respiratory failure with hypoxia (HCC)   Type 2 diabetes mellitus with complication, without long-term current use of insulin (HCC)   Elevated liver enzymes   Acute on chronic congestive heart failure (HCC)   #1 acute hypoxic respiratory failure secondary to acute systolic CHF new onset-patient admitted with dyspnea on exertion and progressively worsening shortness of breath.  Patient's pulse ox on room air at the time of admission was in the 80s.  Chest x-ray consistent with pulmonary edema with elevated BNP of 1584 and lower extremity edema. Echocardiogram-EF 45 to 50% left ventricle mildly decreased function left ventricle also demonstrate global hypokinesis.  Moderate mitral stenosis. She is negative by 1 L documented. Patient continues with shortness of breath and oxygen dependent. Renal function remained stable 0.67 creatinine. Cardiology adjusting the dose of Lasix. ?  Pneumonia-I am not 100% convinced that she has pneumonia.  However with increased white count in spite of being on doxycycline and ongoing shortness  of which is not better with  diuresing I will start her on cefepime and watch her closely. Cardiology ordering CT chest.  #2 abnormal LFTs sec likely secondary to passive liver congestion improving AST 93 from 280 ALT 176 from 304 albumin is 2.0.  #3 uncomplicated diverticulitis of the descending colon by CT scan patient asymptomatic  #4 type 2 diabetes with neuropathy continue Lantus patient became hypoglycemic this morning after getting correction dose of insulin. CBG (last 3)  Recent Labs    03/15/20 0214 03/15/20 0600 03/15/20 1102  GLUCAP 67* 152* 193*    #5 elevated troponin no evidence of acute coronary syndrome, likely due to demand ischemia from volume overload.  #6 history of essential hypertension continue above medications  #7 history of peripheral arterial disease continue current meds  #8 diabetic foot ulcer right foot status post transmetatarsal amputation 03/11/2020.  On.doxycycline finish the course.  WBC is trending down  #9 anemia chronic stable  #10 thyroid goiter patient has a known extensive thyroid goiter with substernal extension on the right causing mild shift to the left of the trachea and esophagus.     Estimated body mass index is 26.55 kg/m as calculated from the following:   Height as of this encounter: 5\' 8"  (1.727 m).   Weight as of this encounter: 79.2 kg.  DVT prophylaxis: Lovenox  code Status: DO NOT RESUSCITATE Family Communication: Discussed with her son yesterday over the phone  disposition Plan:  Status is: Inpatient   Dispo: The patient is from: Home              Anticipated d/c is to: Home              Anticipated d/c date is: 2 days              Patient currently is not medically stable to d/c.    Consultants: Will consult cardiology  Procedures: None Antimicrobials: None  Subjective: Patient is awake alert resting in bed no acute distress She still complains of dyspnea on exertion and shortness of breath Hesitant to take insulin this morning due  to low blood sugar  Objective: Vitals:   03/14/20 1208 03/14/20 1647 03/14/20 2049 03/15/20 0302  BP: 136/65 136/67 115/68 (!) 137/56  Pulse: 62 64 (!) 57 60  Resp: 18 18 17 16   Temp: 97.6 F (36.4 C) 98.4 F (36.9 C) 97.7 F (36.5 C) (!) 97.5 F (36.4 C)  TempSrc: Oral Oral Oral Oral  SpO2: 96% 95% 92% 93%  Weight:    79.2 kg  Height:        Intake/Output Summary (Last 24 hours) at 03/15/2020 1122 Last data filed at 03/15/2020 0945 Gross per 24 hour  Intake 700 ml  Output 2050 ml  Net -1350 ml   Filed Weights   03/13/20 1600 03/14/20 0416 03/15/20 0302  Weight: 80 kg 80.3 kg 79.2 kg    Examination:  General exam: Appears calm and comfortable  Respiratory system: Crackles bibasilar to auscultation. Respiratory effort normal. Cardiovascular system: S1 & S2 heard, RRR. No JVD, murmurs, rubs, gallops or clicks. No pedal edema. Gastrointestinal system: Abdomen is nondistended, soft and nontender. No organomegaly or masses felt. Normal bowel sounds heard. Central nervous system: Alert and oriented. No focal neurological deficits. Extremities: Symmetric 5 x 5 power. Skin: No rashes, lesions or ulcers Psychiatry: Judgement and insight appear normal. Mood & affect appropriate.     Data Reviewed: I have personally reviewed following labs and imaging studies  CBC: Recent Labs  Lab 03/12/20 1414 03/13/20 0747 03/14/20 0452 03/15/20 0501  WBC 17.5* 15.4* 12.2* 13.4*  NEUTROABS 14.7* 12.5*  --   --   HGB 8.2* 8.8* 7.8* 8.4*  HCT 27.2* 28.6* 25.9* 27.3*  MCV 87.5 87.5 86.6 87.2  PLT 569* 589* 505* 035*   Basic Metabolic Panel: Recent Labs  Lab 03/12/20 1414 03/13/20 0747 03/14/20 0452 03/15/20 0501  NA 139 141 140 141  K 4.4 4.0 3.8 3.7  CL 102 101 101 100  CO2 23 27 29 29   GLUCOSE 215* 176* 150* 181*  BUN 26* 22 27* 19  CREATININE 0.95 0.66 0.67 0.66  CALCIUM 8.6* 9.1 8.5* 8.4*  MG  --  1.7 1.7  --    GFR: Estimated Creatinine Clearance: 56.8 mL/min (by  C-G formula based on SCr of 0.66 mg/dL). Liver Function Tests: Recent Labs  Lab 03/12/20 1414 03/13/20 0747 03/14/20 0452 03/15/20 0501  AST 892* 280* 144* 93*  ALT 428* 304* 219* 176*  ALKPHOS 127* 143* 93 98  BILITOT 0.5 0.5 0.5 0.4  PROT 6.7 6.9 5.7* 5.8*  ALBUMIN 2.5* 2.5* 2.1* 2.0*   Recent Labs  Lab 03/12/20 2013  LIPASE 19   No results for input(s): AMMONIA in the last 168 hours. Coagulation Profile: Recent Labs  Lab 03/13/20 0747  INR 1.3*   Cardiac Enzymes: No results for input(s): CKTOTAL, CKMB, CKMBINDEX, TROPONINI in the last 168 hours. BNP (last 3 results) No results for input(s): PROBNP in the last 8760 hours. HbA1C: Recent Labs    03/13/20 1612  HGBA1C 8.3*   CBG: Recent Labs  Lab 03/14/20 1644 03/14/20 2051 03/15/20 0214 03/15/20 0600 03/15/20 1102  GLUCAP 191* 218* 67* 152* 193*   Lipid Profile: No results for input(s): CHOL, HDL, LDLCALC, TRIG, CHOLHDL, LDLDIRECT in the last 72 hours. Thyroid Function Tests: Recent Labs    03/12/20 2015  TSH 0.892   Anemia Panel: Recent Labs    03/13/20 1612  VITAMINB12 757  FOLATE 16.4  TIBC 188*  IRON 23*  RETICCTPCT 1.8   Sepsis Labs: Recent Labs  Lab 03/12/20 2013  LATICACIDVEN 1.8    Recent Results (from the past 240 hour(s))  SARS CORONAVIRUS 2 (TAT 6-24 HRS) Nasopharyngeal Nasopharyngeal Swab     Status: None   Collection Time: 03/08/20 11:29 AM   Specimen: Nasopharyngeal Swab  Result Value Ref Range Status   SARS Coronavirus 2 NEGATIVE NEGATIVE Final    Comment: (NOTE) SARS-CoV-2 target nucleic acids are NOT DETECTED.  The SARS-CoV-2 RNA is generally detectable in upper and lower respiratory specimens during the acute phase of infection. Negative results do not preclude SARS-CoV-2 infection, do not rule out co-infections with other pathogens, and should not be used as the sole basis for treatment or other patient management decisions. Negative results must be combined with  clinical observations, patient history, and epidemiological information. The expected result is Negative.  Fact Sheet for Patients: SugarRoll.be  Fact Sheet for Healthcare Providers: https://www.woods-Kaylon [place].com/  This test is not yet approved or cleared by the [place] FDA and  has been authorized for detection and/or diagnosis of SARS-CoV-2 by FDA under an Emergency Use Authorization (EUA). This EUA will remain  in effect (meaning this test can be used) for the duration of the COVID-19 declaration under Se ction 564(b)(1) of the Act, 21 U.S.C. section 360bbb-3(b)(1), unless the authorization is terminated or revoked sooner.  Performed at [facility], [place] [address], [place], [place] [address]  SARS Coronavirus 2 by RT PCR (hospital order, performed in [facility] hospital lab) Nasopharyngeal Nasopharyngeal Swab     Status: None   Collection Time: 03/12/20 10:21 PM   Specimen: Nasopharyngeal Swab  Result Value Ref Range Status   SARS Coronavirus 2 NEGATIVE NEGATIVE Final    Comment: (NOTE) SARS-CoV-2 target nucleic acids are NOT DETECTED.  The SARS-CoV-2 RNA is generally detectable in upper and lower respiratory specimens during the acute phase of infection. The lowest concentration of SARS-CoV-2 viral copies this assay can detect is 250 copies / mL. A negative result does not preclude SARS-CoV-2 infection and should not be used as the sole basis for treatment or other patient management decisions.  A negative result may occur with improper specimen collection / handling, submission of specimen other than nasopharyngeal swab, presence of viral mutation(s) within the areas targeted by this assay, and inadequate number of viral copies (<250 copies / mL). A negative result must be combined with clinical observations, patient history, and epidemiological information.  Fact Sheet for Patients:     StrictlyIdeas.no  Fact Sheet for Healthcare Providers: BankingDealers.co.za  This test is not yet approved or  cleared by the [place] FDA and has been authorized for detection and/or diagnosis of SARS-CoV-2 by FDA under an Emergency Use Authorization (EUA).  This EUA will remain in effect (meaning this test can be used) for the duration of the COVID-19 declaration under Section 564(b)(1) of the Act, 21 U.S.C. section 360bbb-3(b)(1), unless the authorization is terminated or revoked sooner.  Performed at [facility], [place] [address] [place], [place] [address]   Culture, blood (routine x 2)     Status: None (Preliminary result)   Collection Time: 03/13/20  8:10 AM   Specimen: BLOOD  Result Value Ref Range Status   Specimen Description BLOOD RIGHT ANTECUBITAL  Final   Special Requests   Final    BOTTLES DRAWN AEROBIC AND ANAEROBIC Blood Culture adequate volume   Culture   Final    NO GROWTH 2 DAYS Performed at [facility], [place] [address], [place], [place] [address]    Report Status PENDING  Incomplete  Culture, blood (routine x 2)     Status: None (Preliminary result)   Collection Time: 03/13/20  8:18 AM   Specimen: BLOOD RIGHT HAND  Result Value Ref Range Status   Specimen Description BLOOD RIGHT HAND  Final   Special Requests   Final    BOTTLES DRAWN AEROBIC AND ANAEROBIC Blood Culture adequate volume   Culture   Final    NO GROWTH 2 DAYS Performed at [facility], [place] [address], [place], [place] [address]    Report Status PENDING  Incomplete         Radiology Studies: DG Chest 2 View  Result Date: 03/14/2020 CLINICAL DATA:  Shortness of breath. EXAM: CHEST - 2 VIEW COMPARISON:  Chest radiograph and CT chest dated 03/12/2020. FINDINGS: The heart remains enlarged. Vascular calcifications are seen in the aortic arch. Small bilateral pleural effusions with associated bibasilar atelectasis/airspace  disease appear unchanged on the right and decreased on the left. There is no pneumothorax. The patient's known thyroid goiter overlies the right upper lung. Degenerative changes are seen in the spine. IMPRESSION: Small bilateral pleural effusions with associated bibasilar atelectasis/airspace disease, unchanged on the right and decreased on the left. Electronically Signed   By: Zerita Boers M.D.   On: 03/14/2020 17:06   CT CHEST WO CONTRAST  Result  Date: 03/15/2020 CLINICAL DATA:  Respiratory failure. EXAM: CT CHEST WITHOUT CONTRAST TECHNIQUE: Multidetector CT imaging of the chest was performed following the standard protocol without IV contrast. COMPARISON:  03/12/2020. FINDINGS: Cardiovascular: Atherosclerotic calcification of the aorta, aortic valve and coronary arteries. Heart is enlarged. No pericardial effusion. Mediastinum/Nodes: Multiple thyroid nodules are seen, measuring up to 4.4 x 4.5 cm off the posterior right thyroid, similar. No pathologically enlarged mediastinal or axillary lymph nodes. Hilar regions are difficult to definitively evaluate without IV contrast. Esophagus is grossly unremarkable. Lungs/Pleura: Small bilateral pleural effusions with collapse/consolidation in both lower lobes. Dependent ground-glass is seen as well. 3 mm nodule in the lingula, along the left major fissure (5/79) is unchanged from 12/14/2016 and considered benign. Lungs are otherwise clear. No pleural fluid. Airway is unremarkable. Upper Abdomen: Visualized portions of the liver, adrenal glands, kidneys, spleen and pancreas are unremarkable. Sizable posterior gastric diverticulum. Cholecystectomy. Musculoskeletal: Degenerative changes in the spine. No worrisome lytic or sclerotic lesions. IMPRESSION: 1. Small bilateral pleural effusions with bilateral lower lobe collapse/consolidation which may be due to atelectasis. Difficult to exclude pneumonia. 2. Bilateral thyroid nodules. Recommend thyroid US (ref: J Am Coll  Radiol. 2015 Feb;12(2): 143-50). 3. Aortic atherosclerosis (ICD10-I70.0). Coronary artery calcification. Electronically Signed   By: Lorin Picket M.D.   On: 03/15/2020 11:08        Scheduled Meds: . amLODipine  2.5 mg Oral Daily  . atorvastatin  20 mg Oral Daily  . carvedilol  25 mg Oral BID WC  . clopidogrel  75 mg Oral Daily  . doxycycline  100 mg Oral BID  . furosemide  80 mg Intravenous BID  . insulin aspart  0-15 Units Subcutaneous TID AC & HS  . insulin aspart  10 Units Subcutaneous TID WC  . insulin glargine  35 Units Subcutaneous QPM  . lisinopril  20 mg Oral QPM  . PARoxetine  20 mg Oral Daily  . potassium chloride  40 mEq Oral BID  . sodium chloride flush  3 mL Intravenous Q12H   Continuous Infusions: . sodium chloride       LOS: 2 days     Georgette Shell, MD  03/15/2020, 11:22 AM

## 2020-03-15 NOTE — Progress Notes (Signed)
Cardiology Progress Note  Patient ID: [name] MRN: [id number] DOB: 1931/09/12 Date of Encounter: 03/15/2020  Primary Cardiologist: Larae Grooms, MD  Subjective   Chief Complaint: SOB  HPI: Still SOB. CXR with improved L lung fields. RLL still with fluid vs pna.   ROS:  All other ROS reviewed and negative. Pertinent positives noted in the HPI.     Inpatient Medications  Scheduled Meds: . amLODipine  2.5 mg Oral Daily  . atorvastatin  20 mg Oral Daily  . carvedilol  25 mg Oral BID WC  . clopidogrel  75 mg Oral Daily  . doxycycline  100 mg Oral BID  . furosemide  80 mg Intravenous BID  . insulin aspart  0-15 Units Subcutaneous TID AC & HS  . insulin aspart  10 Units Subcutaneous TID WC  . insulin glargine  35 Units Subcutaneous QPM  . lisinopril  20 mg Oral QPM  . PARoxetine  20 mg Oral Daily  . potassium chloride  40 mEq Oral BID  . sodium chloride flush  3 mL Intravenous Q12H   Continuous Infusions: . sodium chloride     PRN Meds: sodium chloride, acetaminophen, nitroGLYCERIN, ondansetron (ZOFRAN) IV, polyethylene glycol, sodium chloride flush, traMADol   Vital Signs   Vitals:   03/14/20 1208 03/14/20 1647 03/14/20 2049 03/15/20 0302  BP: 136/65 136/67 115/68 (!) 137/56  Pulse: 62 64 (!) 57 60  Resp: 18 18 17 16   Temp: 97.6 F (36.4 C) 98.4 F (36.9 C) 97.7 F (36.5 C) (!) 97.5 F (36.4 C)  TempSrc: Oral Oral Oral Oral  SpO2: 96% 95% 92% 93%  Weight:    79.2 kg  Height:        Intake/Output Summary (Last 24 hours) at 03/15/2020 0908 Last data filed at 03/15/2020 0900 Gross per 24 hour  Intake 700 ml  Output 1450 ml  Net -750 ml   Last 3 Weights 03/15/2020 03/14/2020 03/13/2020  Weight (lbs) 174 lb 9.7 oz 177 lb 0.5 oz 176 lb 5.9 oz  Weight (kg) 79.2 kg 80.3 kg 80 kg      Telemetry  Overnight telemetry shows SR 60s, which I personally reviewed.   ECG  The most recent ECG shows SR LBBB, which I personally reviewed.   Physical Exam    Vitals:   03/14/20 1208 03/14/20 1647 03/14/20 2049 03/15/20 0302  BP: 136/65 136/67 115/68 (!) 137/56  Pulse: 62 64 (!) 57 60  Resp: 18 18 17 16   Temp: 97.6 F (36.4 C) 98.4 F (36.9 C) 97.7 F (36.5 C) (!) 97.5 F (36.4 C)  TempSrc: Oral Oral Oral Oral  SpO2: 96% 95% 92% 93%  Weight:    79.2 kg  Height:         Intake/Output Summary (Last 24 hours) at 03/15/2020 0908 Last data filed at 03/15/2020 0900 Gross per 24 hour  Intake 700 ml  Output 1450 ml  Net -750 ml    Last 3 Weights 03/15/2020 03/14/2020 03/13/2020  Weight (lbs) 174 lb 9.7 oz 177 lb 0.5 oz 176 lb 5.9 oz  Weight (kg) 79.2 kg 80.3 kg 80 kg    Body mass index is 26.55 kg/m.  General: Well nourished, well developed, in no acute distress Head: Atraumatic, normal size  Eyes: PEERLA, EOMI  Neck: Supple, JVD 7-8 cm H20 Endocrine: No thryomegaly Cardiac: Normal S1, S2; RRR; no murmurs, rubs, or gallops Lungs: diminished R lung bases  Abd: Soft, nontender, no hepatomegaly  Ext: trace edema  Musculoskeletal: No deformities, BUE and BLE strength normal and equal Skin: Warm and dry, no rashes   Neuro: Alert and oriented to person, place, time, and situation, CNII-XII grossly intact, no focal deficits  Psych: Normal mood and affect   Labs  High Sensitivity Troponin:   Recent Labs  Lab 03/12/20 2013 03/12/20 2213 03/13/20 0747  TROPONINIHS 238* 293* 224*     Cardiac EnzymesNo results for input(s): TROPONINI in the last 168 hours. No results for input(s): TROPIPOC in the last 168 hours.  Chemistry Recent Labs  Lab 03/13/20 0747 03/14/20 0452 03/15/20 0501  NA 141 140 141  K 4.0 3.8 3.7  CL 101 101 100  CO2 27 29 29   GLUCOSE 176* 150* 181*  BUN 22 27* 19  CREATININE 0.66 0.67 0.66  CALCIUM 9.1 8.5* 8.4*  PROT 6.9 5.7* 5.8*  ALBUMIN 2.5* 2.1* 2.0*  AST 280* 144* 93*  ALT 304* 219* 176*  ALKPHOS 143* 93 98  BILITOT 0.5 0.5 0.4  GFRNONAA >60 >60 >60  GFRAA >60 >60 >60  ANIONGAP 13 10 12      Hematology Recent Labs  Lab 03/13/20 0747 03/13/20 1612 03/14/20 0452 03/15/20 0501  WBC 15.4*  --  12.2* 13.4*  RBC 3.27* 3.10* 2.99* 3.13*  HGB 8.8*  --  7.8* 8.4*  HCT 28.6*  --  25.9* 27.3*  MCV 87.5  --  86.6 87.2  MCH 26.9  --  26.1 26.8  MCHC 30.8  --  30.1 30.8  RDW 15.0  --  15.0 15.1  PLT 589*  --  505* 537*   BNP Recent Labs  Lab 03/12/20 2013  BNP 1,584.9*    DDimer No results for input(s): DDIMER in the last 168 hours.   Radiology  DG Chest 2 View  Result Date: 03/14/2020 CLINICAL DATA:  Shortness of breath. EXAM: CHEST - 2 VIEW COMPARISON:  Chest radiograph and CT chest dated 03/12/2020. FINDINGS: The heart remains enlarged. Vascular calcifications are seen in the aortic arch. Small bilateral pleural effusions with associated bibasilar atelectasis/airspace disease appear unchanged on the right and decreased on the left. There is no pneumothorax. The patient's known thyroid goiter overlies the right upper lung. Degenerative changes are seen in the spine. IMPRESSION: Small bilateral pleural effusions with associated bibasilar atelectasis/airspace disease, unchanged on the right and decreased on the left. Electronically Signed   By: Zerita Boers M.D.   On: 03/14/2020 17:06    Cardiac Studies  TTE 03/13/2020 1. Left ventricular ejection fraction, by estimation, is 45 to 50%. The  left ventricle has mildly decreased function. The left ventricle  demonstrates global hypokinesis. There is mild concentric left ventricular  hypertrophy. Left ventricular diastolic  function could not be evaluated.  2. Right ventricular systolic function is normal. The right ventricular  size is normal. Tricuspid regurgitation signal is inadequate for assessing  PA pressure.  3. MVA by VTI 1.50 cm2. The mitral valve is degenerative. Moderate mitral  valve regurgitation. Moderate mitral stenosis. The mean mitral valve  gradient is 4.0 mmHg with average heart rate of 79 bpm.  4. The  aortic valve is tricuspid. Aortic valve regurgitation is not  visualized. Mild to moderate aortic valve sclerosis/calcification is  present, without any evidence of aortic stenosis.  5. The inferior vena cava is dilated in size with >50% respiratory  variability, suggesting right atrial pressure of 8 mmHg.   Patient Profile  [name] is a 86 y.o. female with CAD (PCI to LCX, CTO  RCA with collaterals), EF 45-50%, PAD, DM who was admitted 03/13/2020 with hypoxic respiratory failure 2/2 CHF.  Assessment & Plan   1. Acute Systolic HF 44-97%/NPYYFRT Respiratory Failure  -EF stable. Still on O2 despite diuresis. Does not appear that volume overloaded.  -pleural effusions now small but RLL still with significant rales. Possible PNA. Will repeat chest ct without contrast today to better evaluate.  -will continue IV lasix today.   2. Elevated troponin/demand -trop elevation flat and not concerning for ACS. EKG unchanged. No symptoms of chest pain.  -echo with EF 45-50% similar to prior.  -continue home cardiac meds  For questions or updates, please contact [place] Please consult [email or web address] for contact info under   Time Spent with Patient: I have spent a total of 25 minutes with patient reviewing hospital notes, telemetry, EKGs, labs and examining the patient as well as establishing an assessment and plan that was discussed with the patient.  > 50% of time was spent in direct patient care.    Signed, Addison Naegeli. Audie Box, [place]  03/15/2020 9:09 AM

## 2020-03-15 NOTE — Patient Outreach (Signed)
  [facility]) Care Management Chronic Special Needs Program    03/15/2020  Name: Jeanette, Matthews: 01/14/1932  MRN: [id number]   Ms. [name] is enrolled in a chronic special needs plan. Client presented to hospital on 03/11/20-right fott transmetatarsal amputation. Admitted 03/13/20 from emergency department with acute cardiogenic pulmonary edema.   Plan: send care plan to utilization management team. Care coordinate with [facility] hospital liaison as indicated.  Thea Silversmith, RN, MSN, [place] [phone]

## 2020-03-16 ENCOUNTER — Inpatient Hospital Stay (HOSPITAL_COMMUNITY): Payer: HMO

## 2020-03-16 ENCOUNTER — Encounter (HOSPITAL_BASED_OUTPATIENT_CLINIC_OR_DEPARTMENT_OTHER): Payer: HMO | Admitting: Internal Medicine

## 2020-03-16 LAB — BASIC METABOLIC PANEL
Anion gap: 9 (ref 5–15)
BUN: 14 mg/dL (ref 8–23)
CO2: 32 mmol/L (ref 22–32)
Calcium: 8.7 mg/dL — ABNORMAL LOW (ref 8.9–10.3)
Chloride: 100 mmol/L (ref 98–111)
Creatinine, Ser: 0.69 mg/dL (ref 0.44–1.00)
GFR calc Af Amer: >60 mL/min (ref >60)
GFR calc non Af Amer: >60 mL/min (ref >60)
Glucose, Bld: 99 mg/dL (ref 70–99)
Potassium: 3.8 mmol/L (ref 3.5–5.1)
Sodium: 141 mmol/L (ref 135–145)

## 2020-03-16 LAB — GLUCOSE, CAPILLARY
Glucose-Capillary: 92 mg/dL (ref 70–99)
Glucose-Capillary: 108 mg/dL — ABNORMAL HIGH (ref 70–99)
Glucose-Capillary: 203 mg/dL — ABNORMAL HIGH (ref 70–99)
Glucose-Capillary: 91 mg/dL (ref 70–99)

## 2020-03-16 MED ORDER — ASPIRIN EC 81 MG PO TBEC
81.0000 mg | DELAYED_RELEASE_TABLET | Freq: Every day | ORAL | Status: DC
  Administered 2020-03-16 – 2020-03-19 (×4): 81 mg via ORAL
  Filled 2020-03-16 (×4): qty 1

## 2020-03-16 MED ORDER — OXYCODONE HCL 5 MG PO TABS
5.0000 mg | ORAL_TABLET | Freq: Four times a day (QID) | ORAL | Status: DC | PRN
  Administered 2020-03-16 – 2020-03-19 (×7): 5 mg via ORAL
  Filled 2020-03-16 (×7): qty 1

## 2020-03-16 MED ORDER — FUROSEMIDE 40 MG PO TABS
40.0000 mg | ORAL_TABLET | Freq: Every day | ORAL | Status: DC
  Administered 2020-03-16 – 2020-03-19 (×4): 40 mg via ORAL
  Filled 2020-03-16 (×4): qty 1

## 2020-03-16 MED ORDER — GABAPENTIN 300 MG PO CAPS
300.0000 mg | ORAL_CAPSULE | Freq: Two times a day (BID) | ORAL | Status: DC
  Administered 2020-03-16 – 2020-03-19 (×7): 300 mg via ORAL
  Filled 2020-03-16 (×7): qty 1

## 2020-03-16 NOTE — Progress Notes (Signed)
PROGRESS NOTE    [name]  ESP:[id number] DOB: October 19, 1931 DOA: 03/12/2020 PCP: Marton Redwood, MD   Brief Narrative: HPI per Dr. Cyd Silence 03/13/2020 85 year old female with past medical history of coronary artery disease (cath 2014 with stent to circumflex, CTO of RCA with collaterals), IDDM type II, peripheral vascular disease (S/P arthrectomy and angioplasty of left peroneal and left superficial femoral 04/2018), multiple diabetic foot ulcers, gastroesophageal reflux disease, hypothyroidism, hypertension who presents to [facility] emergency department complaints of shortness of breath.  Patient underwent right foot transmetatarsal amputation on 8/26 performed by Dr. Doran Durand for gangrenous right foot ulcer.  Postoperatively in recovery, patient was found to be hypotensive requiring administration of intravenous fluids.  Patient's blood pressures eventually improved and patient was discharged home later that evening.  During the evening of 8/26 and into the morning on 8/27 the patient developed severe shortness of breath.  Patient states that the shortness of breath is worse with minimal exertion and improved with rest.  Patient complains of associated nonproductive cough.  Patient also complains of associated paroxysmal nocturnal dyspnea and pillow orthopnea throughout the evening.  Patient denies any associated increased abdominal girth or lower extremity edema.  Patient denies associated chest pain.  Patient symptoms continue to worsen until she eventually presented to [facility] emergency department.  Upon evaluation in the emergency room patient was found to be hypoxic with oxygen saturations in the 80s requiring supplemental oxygen therapy.  Chest imaging was performed revealing no evidence of pulmonary embolism on CTA of the chest but evidence of patchy bilateral infiltrates concerning for pulmonary edema.  BNP was additionally found to be markedly elevated at 1584.   Patient was administered 40 mg of intravenous Lasix.  Patient was also found to have a somewhat elevated troponin of 238.  Patient denies chest pain and exhibited no evidence of dynamic ST segment changes.  Case was discussed with Dr. Jeannette Corpus with cardiology who recommended echocardiogram in the morning with formal consultation then.  The hospitalist group was then called to assess the patient for admission to the hospital.   Assessment & Plan:   Principal Problem:   Acute cardiogenic pulmonary edema (HCC) Active Problems:   Coronary artery disease involving native coronary artery of native heart   Essential hypertension   Mixed hyperlipidemia   Severe peripheral arterial disease (HCC)   Diabetic ulcer of right foot (HCC)   Elevated troponin level not due myocardial infarction   GERD without esophagitis   Anemia   Thyroid goiter   Acute respiratory failure with hypoxia (HCC)   Type 2 diabetes mellitus with complication, without long-term current use of insulin (HCC)   Elevated liver enzymes   Acute on chronic congestive heart failure (HCC)   #1 acute hypoxic respiratory failure secondary to acute systolic CHF new onset-patient admitted with dyspnea on exertion and progressively worsening shortness of breath.  Patient's pulse ox on room air at the time of admission was in the 80s.   Chest x-ray consistent with pulmonary edema with elevated BNP of 1584 and lower extremity edema. Echocardiogram-EF 45 to 50% left ventricle mildly decreased function left ventricle also demonstrate global hypokinesis.  Moderate mitral stenosis. I's and O's are not clearly documented however patient is feeling better but yet not back to her baseline. Renal function remained stable 0.67 creatinine. Cardiology adjusting the dose of Lasix.  Plan Lasix 40 mg daily upon discharge. ?  Pneumonia-I am not 100% convinced that she has pneumonia.  However with  increased white count in spite of being on doxycycline and  ongoing shortness of which is not better with diuresing I will start her on cefepime and watch her closely. CT chest shows small bilateral pleural effusion with bilateral lower lobe collapse and consolidation which may be due to atelectasis difficult to exclude pneumonia bilateral thyroid nodules.  However her procalcitonin also was low.  Since her hypoxia did not improve with diuresis alone cefepime was started 03/15/2020.  She was already on doxycycline status post foot transmetatarsal amputation. Continue antibiotics and finish a course of 5 days and then stop.  #2 abnormal LFTs sec likely secondary to passive liver congestion improving AST 93 from 280 ALT 176 from 304 albumin is 2.0.  #3 uncomplicated diverticulitis of the descending colon by CT scan patient asymptomatic  #4 type 2 diabetes with neuropathy continue Lantus patient became hypoglycemic this morning after getting correction dose of insulin. CBG (last 3)  Recent Labs    03/15/20 2112 03/16/20 0558 03/16/20 1120  GLUCAP 169* 92 108*    #5 elevated troponin no evidence of acute coronary syndrome, likely due to demand ischemia from volume overload.  #6 history of essential hypertension continue above medications  #7 history of peripheral arterial disease continue current meds  #8 diabetic foot ulcer right foot status post transmetatarsal amputation 03/11/2020.  On.doxycycline finish the course.  WBC is trending down.  Seen by Ortho today recommended weightbearing as tolerated in cam boot at all times.  Consult physical therapy.  Follow-up with Dr. Doran Durand  #9 anemia chronic stable  #10 thyroid goiter patient has a known extensive thyroid goiter with substernal extension on the right causing mild shift to the left of the trachea and esophagus.     Estimated body mass index is 25.84 kg/m as calculated from the following:   Height as of this encounter: 5\' 8"  (1.727 m).   Weight as of this encounter: 77.1 kg.  DVT  prophylaxis: Lovenox  code Status: DO NOT RESUSCITATE Family Communication: Discussed with her son yesterday over the phone  disposition Plan:  Status is: Inpatient   Dispo: The patient is from: Home              Anticipated d/c is to: Home PT consult pending              Anticipated d/c date is: 1 day              Patient currently is not medically stable to d/c.    Consultants: Will consult cardiology  Procedures: None Antimicrobials: None  Subjective: Patient is awake alert resting in bed no acute distress She still complains of dyspnea on exertion and shortness of breath Hesitant to take insulin this morning due to low blood sugar  Objective: Vitals:   03/15/20 1253 03/15/20 2013 03/16/20 0444 03/16/20 1159  BP: 125/73 131/68 (!) 144/61 (!) 137/49  Pulse: 60 (!) 56 62 (!) 59  Resp: 16 18 18 16   Temp: 97.8 F (36.6 C) 98.2 F (36.8 C) (!) 97.4 F (36.3 C) 97.8 F (36.6 C)  TempSrc: Oral Oral Oral   SpO2: 96% 97% 98% 97%  Weight:   77.1 kg   Height:        Intake/Output Summary (Last 24 hours) at 03/16/2020 1348 Last data filed at 03/16/2020 1327 Gross per 24 hour  Intake 1183.69 ml  Output 700 ml  Net 483.69 ml   Filed Weights   03/14/20 0416 03/15/20 0302 03/16/20 0444  Weight:  80.3 kg 79.2 kg 77.1 kg    Examination:  General exam: Appears calm and comfortable  Respiratory system: Crackles bibasilar to auscultation. Respiratory effort normal. Cardiovascular system: S1 & S2 heard, RRR. No JVD, murmurs, rubs, gallops or clicks. No pedal edema. Gastrointestinal system: Abdomen is nondistended, soft and nontender. No organomegaly or masses felt. Normal bowel sounds heard. Central nervous system: Alert and oriented. No focal neurological deficits. Extremities: Symmetric 5 x 5 power. Skin: No rashes, lesions or ulcers Psychiatry: Judgement and insight appear normal. Mood & affect appropriate.     Data Reviewed: I have personally reviewed following labs and  imaging studies  CBC: Recent Labs  Lab 03/12/20 1414 03/13/20 0747 03/14/20 0452 03/15/20 0501  WBC 17.5* 15.4* 12.2* 13.4*  NEUTROABS 14.7* 12.5*  --   --   HGB 8.2* 8.8* 7.8* 8.4*  HCT 27.2* 28.6* 25.9* 27.3*  MCV 87.5 87.5 86.6 87.2  PLT 569* 589* 505* 532*   Basic Metabolic Panel: Recent Labs  Lab 03/12/20 1414 03/13/20 0747 03/14/20 0452 03/15/20 0501 03/16/20 0736  NA 139 141 140 141 141  K 4.4 4.0 3.8 3.7 3.8  CL 102 101 101 100 100  CO2 23 27 29 29  32  GLUCOSE 215* 176* 150* 181* 99  BUN 26* 22 27* 19 14  CREATININE 0.95 0.66 0.67 0.66 0.69  CALCIUM 8.6* 9.1 8.5* 8.4* 8.7*  MG  --  1.7 1.7  --   --    GFR: Estimated Creatinine Clearance: 56.2 mL/min (by C-G formula based on SCr of 0.69 mg/dL). Liver Function Tests: Recent Labs  Lab 03/12/20 1414 03/13/20 0747 03/14/20 0452 03/15/20 0501  AST 892* 280* 144* 93*  ALT 428* 304* 219* 176*  ALKPHOS 127* 143* 93 98  BILITOT 0.5 0.5 0.5 0.4  PROT 6.7 6.9 5.7* 5.8*  ALBUMIN 2.5* 2.5* 2.1* 2.0*   Recent Labs  Lab 03/12/20 2013  LIPASE 19   No results for input(s): AMMONIA in the last 168 hours. Coagulation Profile: Recent Labs  Lab 03/13/20 0747  INR 1.3*   Cardiac Enzymes: No results for input(s): CKTOTAL, CKMB, CKMBINDEX, TROPONINI in the last 168 hours. BNP (last 3 results) No results for input(s): PROBNP in the last 8760 hours. HbA1C: Recent Labs    03/13/20 1612  HGBA1C 8.3*   CBG: Recent Labs  Lab 03/15/20 1102 03/15/20 1639 03/15/20 2112 03/16/20 0558 03/16/20 1120  GLUCAP 193* 111* 169* 92 108*   Lipid Profile: No results for input(s): CHOL, HDL, LDLCALC, TRIG, CHOLHDL, LDLDIRECT in the last 72 hours. Thyroid Function Tests: No results for input(s): TSH, T4TOTAL, FREET4, T3FREE, THYROIDAB in the last 72 hours. Anemia Panel: Recent Labs    03/13/20 1612  VITAMINB12 757  FOLATE 16.4  TIBC 188*  IRON 23*  RETICCTPCT 1.8   Sepsis Labs: Recent Labs  Lab 03/12/20 2013  03/15/20 0501  PROCALCITON  --  <0.10  LATICACIDVEN 1.8  --     Recent Results (from the past 240 hour(s))  SARS CORONAVIRUS 2 (TAT 6-24 HRS) Nasopharyngeal Nasopharyngeal Swab     Status: None   Collection Time: 03/08/20 11:29 AM   Specimen: Nasopharyngeal Swab  Result Value Ref Range Status   SARS Coronavirus 2 NEGATIVE NEGATIVE Final    Comment: (NOTE) SARS-CoV-2 target nucleic acids are NOT DETECTED.  The SARS-CoV-2 RNA is generally detectable in upper and lower respiratory specimens during the acute phase of infection. Negative results do not preclude SARS-CoV-2 infection, do not rule out co-infections with  other pathogens, and should not be used as the sole basis for treatment or other patient management decisions. Negative results must be combined with clinical observations, patient history, and epidemiological information. The expected result is Negative.  Fact Sheet for Patients: SugarRoll.be  Fact Sheet for Healthcare Providers: https://www.[employer].com/  This test is not yet approved or cleared by the [place] FDA and  has been authorized for detection and/or diagnosis of SARS-CoV-2 by FDA under an Emergency Use Authorization (EUA). This EUA will remain  in effect (meaning this test can be used) for the duration of the COVID-19 declaration under Se ction 564(b)(1) of the Act, 21 U.S.C. section 360bbb-3(b)(1), unless the authorization is terminated or revoked sooner.  Performed at [facility], [place] [address] [place], [place] [address]   SARS Coronavirus 2 by RT PCR (hospital order, performed in [facility] hospital lab) Nasopharyngeal Nasopharyngeal Swab     Status: None   Collection Time: 03/12/20 10:21 PM   Specimen: Nasopharyngeal Swab  Result Value Ref Range Status   SARS Coronavirus 2 NEGATIVE NEGATIVE Final    Comment: (NOTE) SARS-CoV-2 target nucleic acids are NOT DETECTED.  The SARS-CoV-2 RNA  is generally detectable in upper and lower respiratory specimens during the acute phase of infection. The lowest concentration of SARS-CoV-2 viral copies this assay can detect is 250 copies / mL. A negative result does not preclude SARS-CoV-2 infection and should not be used as the sole basis for treatment or other patient management decisions.  A negative result may occur with improper specimen collection / handling, submission of specimen other than nasopharyngeal swab, presence of viral mutation(s) within the areas targeted by this assay, and inadequate number of viral copies (<250 copies / mL). A negative result must be combined with clinical observations, patient history, and epidemiological information.  Fact Sheet for Patients:   StrictlyIdeas.no  Fact Sheet for Healthcare Providers: BankingDealers.co.za  This test is not yet approved or  cleared by the [place] FDA and has been authorized for detection and/or diagnosis of SARS-CoV-2 by FDA under an Emergency Use Authorization (EUA).  This EUA will remain in effect (meaning this test can be used) for the duration of the COVID-19 declaration under Section 564(b)(1) of the Act, 21 U.S.C. section 360bbb-3(b)(1), unless the authorization is terminated or revoked sooner.  Performed at [facility], [place] [address] [place], [place] [address]   Culture, blood (routine x 2)     Status: None (Preliminary result)   Collection Time: 03/13/20  8:10 AM   Specimen: BLOOD  Result Value Ref Range Status   Specimen Description BLOOD RIGHT ANTECUBITAL  Final   Special Requests   Final    BOTTLES DRAWN AEROBIC AND ANAEROBIC Blood Culture adequate volume   Culture   Final    NO GROWTH 3 DAYS Performed at [facility], [place] [address], [place], [place] [address]    Report Status PENDING  Incomplete  Culture, blood (routine x 2)     Status: None (Preliminary result)   Collection  Time: 03/13/20  8:18 AM   Specimen: BLOOD RIGHT HAND  Result Value Ref Range Status   Specimen Description BLOOD RIGHT HAND  Final   Special Requests   Final    BOTTLES DRAWN AEROBIC AND ANAEROBIC Blood Culture adequate volume   Culture   Final    NO GROWTH 3 DAYS Performed at [facility], [place] [address] [place], [place] [address]    Report  Status PENDING  Incomplete         Radiology Studies: DG Chest 2 View  Result Date: 03/14/2020 CLINICAL DATA:  Shortness of breath. EXAM: CHEST - 2 VIEW COMPARISON:  Chest radiograph and CT chest dated 03/12/2020. FINDINGS: The heart remains enlarged. Vascular calcifications are seen in the aortic arch. Small bilateral pleural effusions with associated bibasilar atelectasis/airspace disease appear unchanged on the right and decreased on the left. There is no pneumothorax. The patient's known thyroid goiter overlies the right upper lung. Degenerative changes are seen in the spine. IMPRESSION: Small bilateral pleural effusions with associated bibasilar atelectasis/airspace disease, unchanged on the right and decreased on the left. Electronically Signed   By: Zerita Boers M.D.   On: 03/14/2020 17:06   CT CHEST WO CONTRAST  Result Date: 03/15/2020 CLINICAL DATA:  Respiratory failure. EXAM: CT CHEST WITHOUT CONTRAST TECHNIQUE: Multidetector CT imaging of the chest was performed following the standard protocol without IV contrast. COMPARISON:  03/12/2020. FINDINGS: Cardiovascular: Atherosclerotic calcification of the aorta, aortic valve and coronary arteries. Heart is enlarged. No pericardial effusion. Mediastinum/Nodes: Multiple thyroid nodules are seen, measuring up to 4.4 x 4.5 cm off the posterior right thyroid, similar. No pathologically enlarged mediastinal or axillary lymph nodes. Hilar regions are difficult to definitively evaluate without IV contrast. Esophagus is grossly unremarkable. Lungs/Pleura: Small bilateral pleural effusions with  collapse/consolidation in both lower lobes. Dependent ground-glass is seen as well. 3 mm nodule in the lingula, along the left major fissure (5/79) is unchanged from 12/14/2016 and considered benign. Lungs are otherwise clear. No pleural fluid. Airway is unremarkable. Upper Abdomen: Visualized portions of the liver, adrenal glands, kidneys, spleen and pancreas are unremarkable. Sizable posterior gastric diverticulum. Cholecystectomy. Musculoskeletal: Degenerative changes in the spine. No worrisome lytic or sclerotic lesions. IMPRESSION: 1. Small bilateral pleural effusions with bilateral lower lobe collapse/consolidation which may be due to atelectasis. Difficult to exclude pneumonia. 2. Bilateral thyroid nodules. Recommend thyroid US (ref: J Am Coll Radiol. 2015 Feb;12(2): 143-50). 3. Aortic atherosclerosis (ICD10-I70.0). Coronary artery calcification. Electronically Signed   By: Lorin Picket M.D.   On: 03/15/2020 11:08        Scheduled Meds: . amLODipine  2.5 mg Oral Daily  . atorvastatin  20 mg Oral Daily  . carvedilol  25 mg Oral BID WC  . clopidogrel  75 mg Oral Daily  . doxycycline  100 mg Oral BID  . furosemide  40 mg Oral Daily  . insulin aspart  0-15 Units Subcutaneous TID AC & HS  . insulin aspart  10 Units Subcutaneous TID WC  . insulin glargine  35 Units Subcutaneous QPM  . lisinopril  20 mg Oral QPM  . PARoxetine  20 mg Oral Daily  . potassium chloride  40 mEq Oral BID  . sodium chloride flush  3 mL Intravenous Q12H   Continuous Infusions: . sodium chloride    . ceFEPime (MAXIPIME) IV 2 g (03/16/20 1034)     LOS: 3 days     Georgette Shell, MD  03/16/2020, 1:48 PM

## 2020-03-16 NOTE — Progress Notes (Signed)
Patient ID: [name], female   DOB: [birth year], 86 y.o.   MRN: [id number]   LOS: 3 days   Subjective: Foot hurting, about the same as immediately post-op, possibly a tad worse last night.   Objective: Vital signs in last 24 hours: Temp:  [97.4 F (36.3 C)-98.2 F (36.8 C)] 97.4 F (36.3 C) (08/31 0444) Pulse Rate:  [56-62] 62 (08/31 0444) Resp:  [16-18] 18 (08/31 0444) BP: (125-144)/(61-73) 144/61 (08/31 0444) SpO2:  [96 %-98 %] 98 % (08/31 0444) Weight:  [77.1 kg] 77.1 kg (08/31 0444) Last BM Date: 03/15/20   Laboratory  CBC Recent Labs    03/14/20 0452 03/15/20 0501  WBC 12.2* 13.4*  HGB 7.8* 8.4*  HCT 25.9* 27.3*  PLT 505* 537*   BMET Recent Labs    03/15/20 0501 03/16/20 0736  NA 141 141  K 3.7 3.8  CL 100 100  CO2 29 32  GLUCOSE 181* 99  BUN 19 14  CREATININE 0.66 0.69  CALCIUM 8.4* 8.7*     Physical Exam General appearance: alert and no distress  Right foot: Incision C/D/I, pain improved with dressing off. Redressed and CAM boot replaced.     Assessment/Plan: S/p TMT amputation and achilles lenghtening -- Continue WBAT in CAM boot which is to remain on at all times. F/u with Dr. Doran Durand as scheduled.    Lisette Abu, PA-C Orthopedic Surgery [phone] 03/16/2020

## 2020-03-16 NOTE — Evaluation (Addendum)
I agree with the following treatment note after review of the documentation. This session was performed under the supervision of a licensed clinician.   Lou Miner, DPT  Acute Rehabilitation Services  Pager: [phone]  Physical Therapy Evaluation Patient Details Name: [name] MRN: [id number] DOB: [birth year] Today's Date: 03/16/2020   History of Present Illness  pt is a 86 yo female who is recently s/p R transmetatarsal amputation was admitted secondary to  SOB and orthopnea. Pt was noted to have bilateral pleural effusions. Pt is thought to have new onset CHF. Pt has a PMH of CAD, HTN, HLD, T2DM, PVD, Anemia, ARF, CHF and angioplasty of BLE in 2019.     Clinical Impression  Pt was evaluated for the above diagnosis and impairments below. Pt required +2 mod assist for transfers and short distance ambulation with RW. Recent R transmetatarsal amputee and exhibited increased difficulty with ambulation.. She lives alone and is currently at increased risk for falls. Recommending SNF level therapy at d/c. Pt would continue to benefit from acute therapy in order to increase her independence with functional tasks. Will continue to follow acutely.     Follow Up Recommendations SNF;Supervision for mobility/OOB    Equipment Recommendations  Wheelchair (measurements PT);Wheelchair cushion (measurements PT)    Recommendations for Other Services OT consult     Precautions / Restrictions Precautions Precautions: Fall Required Braces or Orthoses: Other Brace Other Brace: CAM walker boot at all times per ortho.  Restrictions Weight Bearing Restrictions: Yes RLE Weight Bearing: Weight bearing as tolerated (in CAM walker boot) Other Position/Activity Restrictions: Must have CAM boot on at all times      Mobility  Bed Mobility Overal bed mobility: Needs Assistance Bed Mobility: Supine to Sit;Sit to Supine     Supine to sit: Supervision;HOB elevated Sit to supine: Supervision;HOB  elevated   General bed mobility comments: pt required supervision for safety  Transfers Overall transfer level: Needs assistance Equipment used: Rolling walker (2 wheeled);None Transfers: Sit to/from Stand Sit to Stand: Mod assist;+2 physical assistance         General transfer comment: PT stood in front of pt and had pt hold to PT arms for lift assist. Nursing student in the room and retreved RW and placed in front of pt for increased support. Pt had LOB x 1 immediately in standing and required assist with controlled descent back to sitting. Notable posterior lean upon standing initially. Pt required +2 assist on second attempt to stand.   Ambulation/Gait Ambulation/Gait assistance: Mod assist;+2 physical assistance;+2 safety/equipment Gait Distance (Feet): 6 Feet Assistive device: Rolling walker (2 wheeled) Gait Pattern/deviations: Step-to pattern;Decreased step length - left;Decreased stance time - right;Trunk flexed Gait velocity: decreased   General Gait Details: pt was very slow and unsteady with gait. Pt required +2 heavy mod assist with RW for ambulation. Pt required verbal cues for RW sequencing and proximity to device.   Stairs            Wheelchair Mobility    Modified Rankin (Stroke Patients Only)       Balance Overall balance assessment: Needs assistance Sitting-balance support: Feet supported;No upper extremity supported Sitting balance-Leahy Scale: Good     Standing balance support: During functional activity;Bilateral upper extremity supported Standing balance-Leahy Scale: Poor Standing balance comment: pt reliant on RW and heavy mod A in standing                Pertinent Vitals/Pain Pain Assessment: No/denies pain    Home  Living Family/patient expects to be discharged to:: Private residence Living Arrangements: Alone Available Help at Discharge: Family;Personal care attendant Type of Home: Apartment Home Access: Level entry     Home  Layout: One level Home Equipment: Walker - 2 wheels;Grab bars - tub/shower Additional Comments: pt lives alone but has a son that could be available for d/c. Pt has a caregiver that comes to help 2x/wk for 3 hours for housework and meal prep    Prior Function Level of Independence: Independent with assistive device(s)         Comments: pt used a rollator prior to amputation      Hand Dominance        Extremity/Trunk Assessment   Upper Extremity Assessment Upper Extremity Assessment: Defer to OT evaluation    Lower Extremity Assessment Lower Extremity Assessment: RLE deficits/detail RLE Deficits / Details: Recent R transmetatarsal amputation    Cervical / Trunk Assessment Cervical / Trunk Assessment: Normal  Communication   Communication: No difficulties  Cognition Arousal/Alertness: Awake/alert Behavior During Therapy: WFL for tasks assessed/performed Overall Cognitive Status: Within Functional Limits for tasks assessed           General Comments      Exercises     Assessment/Plan    PT Assessment Patient needs continued PT services  PT Problem List Decreased strength;Decreased range of motion;Decreased activity tolerance;Decreased balance;Decreased mobility;Decreased coordination;Decreased knowledge of use of DME;Decreased safety awareness;Pain       PT Treatment Interventions DME instruction;Gait training;Stair training;Functional mobility training;Therapeutic activities;Therapeutic exercise;Balance training;Neuromuscular re-education;Wheelchair mobility training    PT Goals (Current goals can be found in the Care Plan section)  Acute Rehab PT Goals Patient Stated Goal: to be independent again PT Goal Formulation: With patient Time For Goal Achievement: 03/30/20 Potential to Achieve Goals: Fair    Frequency Min 2X/week   Barriers to discharge Decreased caregiver support      Co-evaluation               AM-PAC PT "6 Clicks" Mobility   Outcome Measure Help needed turning from your back to your side while in a flat bed without using bedrails?: None Help needed moving from lying on your back to sitting on the side of a flat bed without using bedrails?: None Help needed moving to and from a bed to a chair (including a wheelchair)?: A Lot Help needed standing up from a chair using your arms (e.g., wheelchair or bedside chair)?: A Lot Help needed to walk in hospital room?: A Lot Help needed climbing 3-5 steps with a railing? : Total 6 Click Score: 15    End of Session Equipment Utilized During Treatment: Gait belt;Oxygen (2L) Activity Tolerance: Patient tolerated treatment well Patient left: in bed;with call bell/phone within reach;with nursing/sitter in room Nurse Communication: Mobility status PT Visit Diagnosis: Unsteadiness on feet (R26.81);Other abnormalities of gait and mobility (R26.89);Muscle weakness (generalized) (M62.81);Difficulty in walking, not elsewhere classified (R26.2);Pain Pain - Right/Left: Right Pain - part of body: Ankle and joints of foot    Time: 1700-1749 PT Time Calculation (min) (ACUTE ONLY): 30 min   Charges:   PT Evaluation $PT Eval Moderate Complexity: 1 Mod PT Treatments $Therapeutic Activity: 8-22 mins       Gloriann Loan, SPT  Acute Rehabilitation Services  Office: [phone]  03/16/2020, 5:56 PM

## 2020-03-16 NOTE — Progress Notes (Signed)
Cardiology Progress Note  Patient ID: [name] MRN: [id number] DOB: 25-May-1932 Date of Encounter: 03/16/2020  Primary Cardiologist: Larae Grooms, MD  Subjective   Chief Complaint: SOB  HPI: Breathing ok, foot hurts, wants to get up and start moving around. Continues to need O2  ROS:  All other ROS reviewed and negative. Pertinent positives noted in the HPI.     Inpatient Medications  Scheduled Meds: . amLODipine  2.5 mg Oral Daily  . atorvastatin  20 mg Oral Daily  . carvedilol  25 mg Oral BID WC  . clopidogrel  75 mg Oral Daily  . doxycycline  100 mg Oral BID  . furosemide  40 mg Oral Daily  . insulin aspart  0-15 Units Subcutaneous TID AC & HS  . insulin aspart  10 Units Subcutaneous TID WC  . insulin glargine  35 Units Subcutaneous QPM  . lisinopril  20 mg Oral QPM  . PARoxetine  20 mg Oral Daily  . potassium chloride  40 mEq Oral BID  . sodium chloride flush  3 mL Intravenous Q12H   Continuous Infusions: . sodium chloride    . ceFEPime (MAXIPIME) IV 2 g (03/16/20 1034)   PRN Meds: sodium chloride, acetaminophen, nitroGLYCERIN, ondansetron (ZOFRAN) IV, polyethylene glycol, sodium chloride flush, traMADol   Vital Signs   Vitals:   03/15/20 0302 03/15/20 1253 03/15/20 2013 03/16/20 0444  BP: (!) 137/56 125/73 131/68 (!) 144/61  Pulse: 60 60 (!) 56 62  Resp: 16 16 18 18   Temp: (!) 97.5 F (36.4 C) 97.8 F (36.6 C) 98.2 F (36.8 C) (!) 97.4 F (36.3 C)  TempSrc: Oral Oral Oral Oral  SpO2: 93% 96% 97% 98%  Weight: 79.2 kg   77.1 kg  Height:        Intake/Output Summary (Last 24 hours) at 03/16/2020 1109 Last data filed at 03/16/2020 1003 Gross per 24 hour  Intake 1183.69 ml  Output 700 ml  Net 483.69 ml   Last 3 Weights 03/16/2020 03/15/2020 03/14/2020  Weight (lbs) 169 lb 15.6 oz 174 lb 9.7 oz 177 lb 0.5 oz  Weight (kg) 77.1 kg 79.2 kg 80.3 kg      Telemetry  Overnight telemetry shows SR, Sinus brady high 50s, which I personally reviewed.    ECG  The most recent ECG shows SR LBBB, which I personally reviewed.   Physical Exam   Vitals:   03/15/20 0302 03/15/20 1253 03/15/20 2013 03/16/20 0444  BP: (!) 137/56 125/73 131/68 (!) 144/61  Pulse: 60 60 (!) 56 62  Resp: 16 16 18 18   Temp: (!) 97.5 F (36.4 C) 97.8 F (36.6 C) 98.2 F (36.8 C) (!) 97.4 F (36.3 C)  TempSrc: Oral Oral Oral Oral  SpO2: 93% 96% 97% 98%  Weight: 79.2 kg   77.1 kg  Height:         Intake/Output Summary (Last 24 hours) at 03/16/2020 1109 Last data filed at 03/16/2020 1003 Gross per 24 hour  Intake 1183.69 ml  Output 700 ml  Net 483.69 ml    Last 3 Weights 03/16/2020 03/15/2020 03/14/2020  Weight (lbs) 169 lb 15.6 oz 174 lb 9.7 oz 177 lb 0.5 oz  Weight (kg) 77.1 kg 79.2 kg 80.3 kg    Body mass index is 25.84 kg/m.  General: Well developed, well nourished, female in no acute distress Head: Eyes PERRLA, Head normocephalic and atraumatic Lungs: rales bases bilaterally to auscultation. Heart: HRRR S1 S2, without rub or gallop. No murmur. 3/4  extremity pulses are 2+. RLE wounds dressed, not disturbed. Mild JVD. Abdomen: Bowel sounds are present, abdomen soft and non-tender without masses or  hernias noted. Msk: Normal strength and tone for age. Extremities: No clubbing, cyanosis or edema.  Skin:  No rashes or lesions noted. Neuro: Alert and oriented X 3. Psych:  Good affect, responds appropriately  Labs  High Sensitivity Troponin:   Recent Labs  Lab 03/12/20 2013 03/12/20 2213 03/13/20 0747  TROPONINIHS 238* 293* 224*      Chemistry Recent Labs  Lab 03/13/20 0747 03/13/20 0747 03/14/20 0452 03/15/20 0501 03/16/20 0736  NA 141   < > 140 141 141  K 4.0   < > 3.8 3.7 3.8  CL 101   < > 101 100 100  CO2 27   < > 29 29 32  GLUCOSE 176*   < > 150* 181* 99  BUN 22   < > 27* 19 14  CREATININE 0.66   < > 0.67 0.66 0.69  CALCIUM 9.1   < > 8.5* 8.4* 8.7*  PROT 6.9  --  5.7* 5.8*  --   ALBUMIN 2.5*  --  2.1* 2.0*  --   AST 280*  --   144* 93*  --   ALT 304*  --  219* 176*  --   ALKPHOS 143*  --  93 98  --   BILITOT 0.5  --  0.5 0.4  --   GFRNONAA >60   < > >60 >60 >60  GFRAA >60   < > >60 >60 >60  ANIONGAP 13   < > 10 12 9    < > = values in this interval not displayed.    Hematology Recent Labs  Lab 03/13/20 0747 03/13/20 1612 03/14/20 0452 03/15/20 0501  WBC 15.4*  --  12.2* 13.4*  RBC 3.27* 3.10* 2.99* 3.13*  HGB 8.8*  --  7.8* 8.4*  HCT 28.6*  --  25.9* 27.3*  MCV 87.5  --  86.6 87.2  MCH 26.9  --  26.1 26.8  MCHC 30.8  --  30.1 30.8  RDW 15.0  --  15.0 15.1  PLT 589*  --  505* 537*   BNP Recent Labs  Lab 03/12/20 2013  BNP 1,584.9*    Lab Results  Component Value Date   TSH 0.892 03/12/2020     Radiology  DG Chest 2 View  Result Date: 03/14/2020 CLINICAL DATA:  Shortness of breath. EXAM: CHEST - 2 VIEW COMPARISON:  Chest radiograph and CT chest dated 03/12/2020. FINDINGS: The heart remains enlarged. Vascular calcifications are seen in the aortic arch. Small bilateral pleural effusions with associated bibasilar atelectasis/airspace disease appear unchanged on the right and decreased on the left. There is no pneumothorax. The patient's known thyroid goiter overlies the right upper lung. Degenerative changes are seen in the spine. IMPRESSION: Small bilateral pleural effusions with associated bibasilar atelectasis/airspace disease, unchanged on the right and decreased on the left. Electronically Signed   By: Zerita Boers M.D.   On: 03/14/2020 17:06   CT CHEST WO CONTRAST  Result Date: 03/15/2020 CLINICAL DATA:  Respiratory failure. EXAM: CT CHEST WITHOUT CONTRAST TECHNIQUE: Multidetector CT imaging of the chest was performed following the standard protocol without IV contrast. COMPARISON:  03/12/2020. FINDINGS: Cardiovascular: Atherosclerotic calcification of the aorta, aortic valve and coronary arteries. Heart is enlarged. No pericardial effusion. Mediastinum/Nodes: Multiple thyroid nodules are  seen, measuring up to 4.4 x 4.5 cm off the posterior right thyroid, similar. No  pathologically enlarged mediastinal or axillary lymph nodes. Hilar regions are difficult to definitively evaluate without IV contrast. Esophagus is grossly unremarkable. Lungs/Pleura: Small bilateral pleural effusions with collapse/consolidation in both lower lobes. Dependent ground-glass is seen as well. 3 mm nodule in the lingula, along the left major fissure (5/79) is unchanged from 12/14/2016 and considered benign. Lungs are otherwise clear. No pleural fluid. Airway is unremarkable. Upper Abdomen: Visualized portions of the liver, adrenal glands, kidneys, spleen and pancreas are unremarkable. Sizable posterior gastric diverticulum. Cholecystectomy. Musculoskeletal: Degenerative changes in the spine. No worrisome lytic or sclerotic lesions. IMPRESSION: 1. Small bilateral pleural effusions with bilateral lower lobe collapse/consolidation which may be due to atelectasis. Difficult to exclude pneumonia. 2. Bilateral thyroid nodules. Recommend thyroid US (ref: J Am Coll Radiol. 2015 Feb;12(2): 143-50). 3. Aortic atherosclerosis (ICD10-I70.0). Coronary artery calcification. Electronically Signed   By: Lorin Picket M.D.   On: 03/15/2020 11:08    Cardiac Studies  TTE 03/13/2020 1. Left ventricular ejection fraction, by estimation, is 45 to 50%. The  left ventricle has mildly decreased function. The left ventricle  demonstrates global hypokinesis. There is mild concentric left ventricular  hypertrophy. Left ventricular diastolic function could not be evaluated.  2. Right ventricular systolic function is normal. The right ventricular  size is normal. Tricuspid regurgitation signal is inadequate for assessing  PA pressure.  3. MVA by VTI 1.50 cm2. The mitral valve is degenerative. Moderate mitral  valve regurgitation. Moderate mitral stenosis. The mean mitral valve  gradient is 4.0 mmHg with average heart rate of 79 bpm.    4. The aortic valve is tricuspid. Aortic valve regurgitation is not  visualized. Mild to moderate aortic valve sclerosis/calcification is  present, without any evidence of aortic stenosis.  5. The inferior vena cava is dilated in size with >50% respiratory  variability, suggesting right atrial pressure of 8 mmHg.   Patient Profile  [name] is a 86 y.o. female with CAD (PCI to LCX, CTO RCA with collaterals), EF 45-50%, PAD, DM who was admitted 03/13/2020 with hypoxic respiratory failure 2/2 CHF.  Assessment & Plan   1. Acute Systolic HF 67-54%/GBEEFEO Respiratory Failure  - EF 45-50%  - wt down 7 lbs since admit, now 169.97 lbs - small bilateral effusions w/ associated collapse and ATX, difficult to exclude PNA -No cough or fever, will leave decision on ABX and additional diuresis to MD  2. Elevated troponin/demand - mild elevation w/ flat trend and no CP - no ischemic eval indicated - continue home rx  3. Thyroid nodule - will go ahead and order and [place]  Otherwise, per IM  For questions or updates, please contact [place] Please consult [email or web address] for contact info under   Time Spent with Patient: I have spent a total of 25 minutes with patient reviewing hospital notes, telemetry, EKGs, labs and examining the patient as well as establishing an assessment and plan that was discussed with the patient.  > 50% of time was spent in direct patient care.    Signed, Addison Naegeli. Audie Box, [place]  03/16/2020 11:09 AM

## 2020-03-17 DIAGNOSIS — J181 Lobar pneumonia, unspecified organism: Secondary | ICD-10-CM

## 2020-03-17 LAB — GLUCOSE, CAPILLARY
Glucose-Capillary: 101 mg/dL — ABNORMAL HIGH (ref 70–99)
Glucose-Capillary: 174 mg/dL — ABNORMAL HIGH (ref 70–99)
Glucose-Capillary: 126 mg/dL — ABNORMAL HIGH (ref 70–99)
Glucose-Capillary: 79 mg/dL (ref 70–99)
Glucose-Capillary: 74 mg/dL (ref 70–99)
Glucose-Capillary: 122 mg/dL — ABNORMAL HIGH (ref 70–99)

## 2020-03-17 LAB — BASIC METABOLIC PANEL
Anion gap: 12 (ref 5–15)
BUN: 16 mg/dL (ref 8–23)
CO2: 30 mmol/L (ref 22–32)
Calcium: 8.9 mg/dL (ref 8.9–10.3)
Chloride: 97 mmol/L — ABNORMAL LOW (ref 98–111)
Creatinine, Ser: 0.64 mg/dL (ref 0.44–1.00)
GFR calc Af Amer: >60 mL/min (ref >60)
GFR calc non Af Amer: >60 mL/min (ref >60)
Glucose, Bld: 111 mg/dL — ABNORMAL HIGH (ref 70–99)
Potassium: 3.9 mmol/L (ref 3.5–5.1)
Sodium: 139 mmol/L (ref 135–145)

## 2020-03-17 LAB — SARS CORONAVIRUS 2 BY RT PCR (HOSPITAL ORDER, PERFORMED IN ~~LOC~~ HOSPITAL LAB): SARS Coronavirus 2: NEGATIVE

## 2020-03-17 MED ORDER — POTASSIUM CHLORIDE CRYS ER 20 MEQ PO TBCR
40.0000 meq | EXTENDED_RELEASE_TABLET | Freq: Every day | ORAL | Status: DC
  Administered 2020-03-18 – 2020-03-19 (×2): 40 meq via ORAL
  Filled 2020-03-17 (×3): qty 2

## 2020-03-17 NOTE — Evaluation (Signed)
Occupational Therapy Evaluation Patient Details Name: [name] MRN: [id number] DOB: [birth year] Today's Date: 03/17/2020    History of Present Illness pt is a 86 yo female who is recently s/p R transmetatarsal amputation was admitted secondary to  SOB and orthopnea. Pt was noted to have bilateral pleural effusions. Pt is thought to have new onset CHF. Pt has a PMH of CAD, HTN, HLD, T2DM, PVD, Anemia, ARF, CHF and angioplasty of BLE in 2019.    Clinical Impression   Patient is s/p R transmetatarsal amputation with pleural effusions surgery resulting in functional limitations due to the deficits listed below (see OT problem list). Pt with urinary urgency at this time. Pt lost 3 objects on her L side during session with min cues to locate them. Pt progressed with cam boot min (A) RW for basic transfers. Encourage staff to complete hourly transfers to [facility] to help with urgency for urine at this time. Pt has purewick in place in the chair Patient will benefit from skilled OT acutely to increase independence and safety with ADLS to allow discharge SNF.     Follow Up Recommendations  SNF    Equipment Recommendations  3 in 1 bedside commode;Other (comment) (RW)    Recommendations for Other Services       Precautions / Restrictions Precautions Precautions: Fall Required Braces or Orthoses: Other Brace Other Brace: CAM walker boot at all times per ortho.  Restrictions RLE Weight Bearing: Weight bearing as tolerated      Mobility Bed Mobility Overal bed mobility: Needs Assistance Bed Mobility: Supine to Sit     Supine to sit: Supervision     General bed mobility comments: hob 30 degrees  Transfers Overall transfer level: Needs assistance Equipment used: Rolling walker (2 wheeled) Transfers: Sit to/from Stand Sit to Stand: Min assist;From elevated surface         General transfer comment: min cues for hand placement     Balance Overall balance assessment: Needs  assistance         Standing balance support: Bilateral upper extremity supported;During functional activity Standing balance-Leahy Scale: Poor Standing balance comment: reliant on RW or external supports like leaning on sink surface                           ADL either performed or assessed with clinical judgement   ADL Overall ADL's : Needs assistance/impaired Eating/Feeding: Modified independent;Sitting   Grooming: Oral care;Wash/dry face;Wash/dry hands;Min guard;Standing Grooming Details (indicate cue type and reason): sink level with leaning heavily anteriorly onto sink surface Upper Body Bathing: Minimal assistance;Sitting   Lower Body Bathing: Moderate assistance;Sit to/from stand       Lower Body Dressing: Moderate assistance Lower Body Dressing Details (indicate cue type and reason): able to don doff L sock this session Toilet Transfer: Minimal assistance;BSC;RW Toilet Transfer Details (indicate cue type and reason): pt able to power up with bil UE on BSC arm rest   Toileting - Clothing Manipulation Details (indicate cue type and reason): pt reports need to void constantly throughout session with only a few drops of urine     Functional mobility during ADLs: Minimal assistance;Rolling walker General ADL Comments: pt completed OOB to [facility] due to urgency then to sink for grooming with final part of our session ending in the chair     Vision Baseline Vision/History: Wears glasses Wears Glasses: Reading only       Perception     Praxis  Pertinent Vitals/Pain Pain Assessment: No/denies pain     Hand Dominance Right   Extremity/Trunk Assessment Upper Extremity Assessment Upper Extremity Assessment: RUE deficits/detail RUE Deficits / Details: decreased shoulder flexion to ~30 degrees and has elected to not have surgery , abduction 20 degrees, decreased elbow extension ,    Lower Extremity Assessment Lower Extremity Assessment: Defer to PT  evaluation   Cervical / Trunk Assessment Cervical / Trunk Assessment: Normal   Communication Communication Communication: No difficulties   Cognition Arousal/Alertness: Awake/alert Behavior During Therapy: WFL for tasks assessed/performed Overall Cognitive Status: Difficult to assess                                 General Comments: pt requires increase time to locate items during session "where did i put that paste " pt loosing 3 objects during session all on L side   General Comments  risk for skin break down due to frequency with voiding bladder    Exercises     Shoulder Instructions      Home Living Family/patient expects to be discharged to:: Private residence Living Arrangements: Alone Available Help at Discharge: Family;Personal care attendant Type of Home: Apartment Home Access: Level entry     Home Layout: One level     Bathroom Shower/Tub: Teacher, early years/pre: Standard Bathroom Accessibility: Yes   Home Equipment: Walker - 2 wheels;Grab bars - tub/shower   Additional Comments: pt lives alone but has a son that could be available for d/c. Pt has a caregiver that comes to help 2x/wk for 3 hours for housework and meal prep      Prior Functioning/Environment Level of Independence: Independent with assistive device(s)        Comments: pt used a rollator prior to amputation         OT Problem List: Decreased strength;Impaired balance (sitting and/or standing);Decreased activity tolerance;Decreased safety awareness;Decreased knowledge of use of DME or AE;Decreased knowledge of precautions      OT Treatment/Interventions: Self-care/ADL training;Therapeutic exercise;Neuromuscular education;Energy conservation;DME and/or AE instruction;Manual therapy;Therapeutic activities;Cognitive remediation/compensation;Patient/family education;Balance training    OT Goals(Current goals can be found in the care plan section) Acute Rehab OT  Goals Patient Stated Goal: to be independent again OT Goal Formulation: Patient unable to participate in goal setting Time For Goal Achievement: 03/31/20 Potential to Achieve Goals: Good  OT Frequency: Min 2X/week   Barriers to D/C: Decreased caregiver support          Co-evaluation              AM-PAC OT "6 Clicks" Daily Activity     Outcome Measure Help from another person eating meals?: None Help from another person taking care of personal grooming?: A Little Help from another person toileting, which includes using toliet, bedpan, or urinal?: A Little Help from another person bathing (including washing, rinsing, drying)?: A Little Help from another person to put on and taking off regular upper body clothing?: A Little Help from another person to put on and taking off regular lower body clothing?: A Lot 6 Click Score: 18   End of Session Equipment Utilized During Treatment: Gait belt;Rolling walker Nurse Communication: Mobility status;Precautions  Activity Tolerance: Patient tolerated treatment well Patient left: in chair;with call bell/phone within reach;Other (comment) (RN and tech alerted to no chair alarm available for pt)  OT Visit Diagnosis: Unsteadiness on feet (R26.81);Muscle weakness (generalized) (M62.81)  Time: 9494-4739 OT Time Calculation (min): 43 min Charges:  OT General Charges $OT Visit: 1 Visit OT Evaluation $OT Eval Moderate Complexity: 1 Mod OT Treatments $Self Care/Home Management : 23-37 mins   Brynn, OTR/L  Acute Rehabilitation Services Pager: [phone] Office: [phone] .   Jeri Modena 03/17/2020, 10:46 AM

## 2020-03-17 NOTE — TOC Initial Note (Signed)
Transition of Care [facility]) - Initial/Assessment Note    Patient Details  Name: [name] MRN: [id number] Date of Birth: October 28, 1931  Transition of Care [facility]) CM/SW Contact:    Loreta Ave, [place] Phone Number: 03/17/2020, 4:33 PM  Clinical Narrative:                 CSW received consult for possible SNF placement at time of discharge. CSW spoke with patient regarding PT recommendation of SNF placement at time of discharge. Patient reported that patient's spouse is currently unable to care for patient at their home given patient's current physical needs and fall risk. Patient expressed understanding of PT recommendation and is agreeable to SNF placement at time of discharge. Patient reports preference for [facility]. CSW discussed insurance authorization process and provided Medicare SNF ratings list. Patient expressed being hopeful for rehab and to feel better soon. Patient provided phone contact for son, Jonni Sanger [phone]. CSW spoke to Ona and provided update. Pt is vaccinated. No further questions reported at this time. CSW to continue to follow and assist with discharge planning needs.   Expected Discharge Plan: Acute to Acute Transfer Barriers to Discharge: Insurance Authorization, Continued Medical Work up   Patient Goals and CMS Choice Patient states their goals for this hospitalization and ongoing recovery are:: Get better soon CMS Medicare.gov Compare Post Acute Care list provided to:: Patient Choice offered to / list presented to : Patient  Expected Discharge Plan and Services Expected Discharge Plan: Acute to Acute Transfer In-house Referral: Clinical Social Work     Living arrangements for the past 2 months: Apartment                                      Prior Living Arrangements/Services Living arrangements for the past 2 months: Apartment Lives with:: Self, Spouse Patient language and need for interpreter reviewed:: Yes Do you feel safe going back  to the place where you live?: No   No one to take care of  Need for Family Participation in Patient Care: Yes (Comment) Care giver support system in place?: Yes (comment)   Criminal Activity/Legal Involvement Pertinent to Current Situation/Hospitalization: No - Comment as needed  Activities of Daily Living      Permission Sought/Granted Permission sought to share information with : Facility Sport and exercise psychologist, Family Supports Permission granted to share information with : Yes, Verbal Permission Granted  Share Information with NAME: Jonni Sanger  Permission granted to share info w AGENCY: SNF  Permission granted to share info w Relationship: Son  Permission granted to share info w Contact Information: [phone]  Emotional Assessment Appearance:: Appears stated age Attitude/Demeanor/Rapport: Gracious Affect (typically observed): Calm Orientation: : Oriented to Self, Oriented to Place, Oriented to  Time, Oriented to Situation Alcohol / Substance Use: Not Applicable Psych Involvement: No (comment)  Admission diagnosis:  Goiter [E04.9] Elevated LFTs [R79.89] Abnormal liver enzymes [R74.8] Acute respiratory failure with hypoxia (HCC) [J96.01] Acute on chronic congestive heart failure (HCC) [I50.9] Anemia, unspecified type [D64.9] Acute on chronic congestive heart failure, unspecified heart failure type ([place]) [I50.9] Chronic bilateral pleural effusions [J90] Patient Active Problem List   Diagnosis Date Noted  . Elevated troponin level not due myocardial infarction 03/13/2020  . GERD without esophagitis 03/13/2020  . Anemia 03/13/2020  . Thyroid goiter 03/13/2020  . Acute respiratory failure with hypoxia ([place]) 03/13/2020  . Type 2 diabetes mellitus with complication, without long-term  current use of insulin ([place]) 03/13/2020  . Elevated liver enzymes 03/13/2020  . Acute on chronic congestive heart failure ([place]) 03/13/2020  . Acute cardiogenic pulmonary edema (HCC) 03/12/2020  .  Diabetic ulcer of right foot ([place]) 03/12/2020  . Achilles tendon contracture, left 07/24/2016  . Ulcer of left foot, limited to breakdown of skin ([place]) 06/01/2016  . Cellulitis 10/27/2015  . Severe peripheral arterial disease ([place]) 12/28/2014  . Chronic diastolic heart failure ([place]) 12/30/2013  . Edema 12/30/2013  . Mixed hyperlipidemia 06/24/2013  . Mitral valve disorders(424.0) 06/24/2013  . Coronary artery disease involving native coronary artery of native heart   . Essential hypertension   . Diabetes mellitus without complication ([place])   . Other primary cardiomyopathies    PCP:  Marton Redwood, MD Pharmacy:   [facility], [address] [place] [address] Phone: [phone] Fax: [phone]     Social Determinants of Health (SDOH) Interventions    Readmission Risk Interventions No flowsheet data found.

## 2020-03-17 NOTE — Progress Notes (Signed)
I agree with the following treatment note after review of the documentation. This session was performed under the supervision of a licensed clinician.   Lou Miner, DPT  Acute Rehabilitation Services  Pager: [phone]    Physical Therapy Treatment Patient Details Name: [name] MRN: [id number] DOB: 1931-11-22 Today's Date: 03/17/2020    History of Present Illness pt is a 86 yo female who is recently s/p R transmetatarsal amputation was admitted secondary to  SOB and orthopnea. Pt was noted to have bilateral pleural effusions. Pt is thought to have new onset CHF. Pt has a PMH of CAD, HTN, HLD, T2DM, PVD, Anemia, ARF, CHF and angioplasty of BLE in 2019.     PT Comments     Pt progressing well towards goals. Pt notably improved with balance, however, continues to require Min A for gait and max multimodal cues for sequencing using RW. Current plan remains appropriate. Will continue to follow acutely to maximize functional mobility independence and safety.   Follow Up Recommendations  SNF;Supervision for mobility/OOB     Equipment Recommendations  Wheelchair (measurements PT);Wheelchair cushion (measurements PT)    Recommendations for Other Services OT consult     Precautions / Restrictions Precautions Precautions: Fall Required Braces or Orthoses: Other Brace Other Brace: CAM walker boot at all times per ortho.  Restrictions Weight Bearing Restrictions: Yes RLE Weight Bearing: Weight bearing as tolerated Other Position/Activity Restrictions: Must have CAM boot on at all times    Mobility  Bed Mobility Overal bed mobility: Needs Assistance Bed Mobility: Sit to Supine       Sit to supine: Supervision   General bed mobility comments: pt required supervision for safety   Transfers Overall transfer level: Needs assistance Equipment used: Rolling walker (2 wheeled) Transfers: Sit to/from Stand Sit to Stand: Min guard         General transfer comment: pt  required min guard assist for safety with RW for sit<>stand. Pt self on hand placement with transfer  Ambulation/Gait Ambulation/Gait assistance: Min assist Gait Distance (Feet): 20 Feet Assistive device: Rolling walker (2 wheeled) Gait Pattern/deviations: Step-to pattern;Decreased step length - left;Decreased stance time - right;Trunk flexed Gait velocity: decreased   General Gait Details: pt was slow and unsteady with gait. Notable progression with ambulation speed and steadiness, still required min assist. Pt requires extensive mulitmodal cueing for RW sequencing and proximity to device   Stairs             Wheelchair Mobility    Modified Rankin (Stroke Patients Only)       Balance Overall balance assessment: Needs assistance Sitting-balance support: Feet supported;No upper extremity supported Sitting balance-Leahy Scale: Good     Standing balance support: Bilateral upper extremity supported;During functional activity Standing balance-Leahy Scale: Poor Standing balance comment: Relinat on RW and external support                             Cognition Arousal/Alertness: Awake/alert Behavior During Therapy: WFL for tasks assessed/performed Overall Cognitive Status: No family/caregiver present to determine baseline cognitive functioning                                        Exercises      General Comments        Pertinent Vitals/Pain Pain Assessment: No/denies pain    Home Living  Prior Function            PT Goals (current goals can now be found in the care plan section) Acute Rehab PT Goals Patient Stated Goal: to be independent again PT Goal Formulation: With patient Time For Goal Achievement: 03/30/20 Potential to Achieve Goals: Fair Progress towards PT goals: Progressing toward goals    Frequency    Min 2X/week      PT Plan Current plan remains appropriate    Co-evaluation               AM-PAC PT "6 Clicks" Mobility   Outcome Measure  Help needed turning from your back to your side while in a flat bed without using bedrails?: None Help needed moving from lying on your back to sitting on the side of a flat bed without using bedrails?: None Help needed moving to and from a bed to a chair (including a wheelchair)?: A Little Help needed standing up from a chair using your arms (e.g., wheelchair or bedside chair)?: A Little Help needed to walk in hospital room?: A Little Help needed climbing 3-5 steps with a railing? : Total 6 Click Score: 18    End of Session Equipment Utilized During Treatment: Gait belt Activity Tolerance: Patient tolerated treatment well Patient left: with call bell/phone within reach;in bed;with bed alarm set Nurse Communication: Mobility status PT Visit Diagnosis: Unsteadiness on feet (R26.81);Other abnormalities of gait and mobility (R26.89);Muscle weakness (generalized) (M62.81);Difficulty in walking, not elsewhere classified (R26.2);Pain Pain - Right/Left: Right Pain - part of body: Ankle and joints of foot     Time: 1130-1150 PT Time Calculation (min) (ACUTE ONLY): 20 min  Charges:  $Gait Training: 8-22 mins                     Gloriann Loan, [place] Office: [phone]

## 2020-03-17 NOTE — Consult Note (Signed)
   [facility] Inpatient Consult   03/17/2020  [name] 1932-07-12 [id number]   [place] [facility] [ACO] Patient:  [facility] [CSNP]  Alerted by a Chronic Care Management Coordinator for [facility] CSNP patient regarding patient is active in [place] for Diabetes program.  Patient has been engaged by a [place] Management Coordinator.   Current EMR review reveals patient is being recommended for a skilled nursing facility stay from Physical Therapy evaluation.  1130: Discussed in progression meeting.  Went by room to round on patient and she is working with therapy at this time.  Plan: Continue to follow with Inpatient Transition Of Care [TOC] team member  For transition and to make aware that Chronic Care Management Coordinator is following in [place]. Patient disposition is ongoing.  Of note, [facility] Care Management services does not replace or interfere with any services that are needed or arranged by inpatient [facility] care management team.  For additional questions or referrals please contact:   Natividad Brood, RN BSN [facility] Liaison  [phone] business mobile phone Toll free office [phone]  Fax number: [phone] [email or web address]

## 2020-03-17 NOTE — Progress Notes (Signed)
PROGRESS NOTE    [name]   OIN:[id number]  DOB: 11-24-1931  DOA: 03/12/2020 PCP: Marton Redwood, MD   Brief Narrative:  [name] 86 year old female with past medical history of coronary artery disease(cath 2014 with stent to circumflex, CTO of RCA with collaterals), IDDMtype II, peripheral vascular disease(S/P arthrectomy and angioplasty of left peroneal and left superficial femoral 04/2018),multiple diabetic foot ulcers, gastroesophageal reflux disease, hypothyroidism, hypertension who presents to [facility] emergency department complaints of shortness of breath. She presents with dyspnea on exertion and is found to be hypoxic in the ED. She was diagnosed with acute CHF.   Subjective: Dyspnea improving but not resolved. Very weak and can barely walk.     Assessment & Plan:   Principal Problem: Acute hypoxic respiratory failure:  (A)  Acute cardiogenic pulmonary edema  - Dyspnea on exertion, hypoxia, CXR revealing pulmonary edema and BNP 1548 - Echocardiogram-EF 45 to 50% left ventricle mildly decreased function left ventricle also demonstrate global hypokinesis.  Moderate mitral stenosis and MR. - cardiology team following - dyspnea has improved- Lasix transitioned to oral - now on room air - cont Coreg-  (B) possible pneumonia-- CT chest 8/30>Small bilateral pleural effusions with collapse/consolidation in both lower lobes. Dependent ground-glass is seen as well. She was started on Cefepime on 8/30 - follow  Active Problems: S/p transmetatarsal amputation on 03/11/20 for diabetic foot ulcer - recommended to continue Doxycycline x 14 days (start date 8/26) - per ortho who did an eval on 8/31, she will need Cam walking boot at all times - PT recommends SNF- she is in agreement    Coronary artery disease involving native coronary artery of native heart/ PAD - cont ASA and Plavix     GERD without esophagitis - on Protonix as outpt     Type 2 diabetes  mellitus with complication - on Humalog and Glargine as outpt - sugars well controlled in hospital  Thyroid Goiter - cont outpatient follow up- CT chest on 8/30 reveals this goiter again- see below report  Time spent in minutes: 35 DVT prophylaxis: SCDs Start: 03/13/20 0021  Code Status: DNR Family Communication:  Disposition Plan:  Status is: Inpatient  Remains inpatient appropriate because:cont to treat for HCAP for 2 more days   Dispo: The patient is from: Home              Anticipated d/c is to: SNF              Anticipated d/c date is: 2 days              Patient currently is not medically stable to d/c.   Consultants:   Cardiology  Orthopedic surgery Procedures:   2 D ECHO 1. Left ventricular ejection fraction, by estimation, is 45 to 50%. The  left ventricle has mildly decreased function. The left ventricle  demonstrates global hypokinesis. There is mild concentric left ventricular  hypertrophy. Left ventricular diastolic  function could not be evaluated.  2. Right ventricular systolic function is normal. The right ventricular  size is normal. Tricuspid regurgitation signal is inadequate for assessing  PA pressure.  3. MVA by VTI 1.50 cm2. The mitral valve is degenerative. Moderate mitral  valve regurgitation. Moderate mitral stenosis. The mean mitral valve  gradient is 4.0 mmHg with average heart rate of 79 bpm.  4. The aortic valve is tricuspid. Aortic valve regurgitation is not  visualized. Mild to moderate aortic valve sclerosis/calcification is  present, without any  evidence of aortic stenosis.  5. The inferior vena cava is dilated in size with >50% respiratory  variability, suggesting right atrial pressure of 8 mmHg.  Antimicrobials:  Anti-infectives (From admission, onward)   Start     Dose/Rate Route Frequency Ordered Stop   03/15/20 1300  ceFEPIme (MAXIPIME) 2 g in sodium chloride 0.9 % 100 mL IVPB        2 g 200 mL/hr over 30 Minutes  Intravenous Every 12 hours 03/15/20 1153     03/13/20 0100  doxycycline (VIBRA-TABS) tablet 100 mg        100 mg Oral 2 times daily 03/13/20 0020         Objective: Vitals:   03/16/20 1939 03/17/20 0439 03/17/20 0852 03/17/20 1159  BP:  107/77 (!) 150/58 109/84  Pulse:  (!) 56 60 (!) 59  Resp:  18 18 20   Temp:  97.9 F (36.6 C) (!) 97.4 F (36.3 C) 98 F (36.7 C)  TempSrc: Oral  Oral Oral  SpO2:  95% 97% 100%  Weight:  77 kg    Height:        Intake/Output Summary (Last 24 hours) at 03/17/2020 1616 Last data filed at 03/17/2020 1314 Gross per 24 hour  Intake 954.31 ml  Output 200 ml  Net 754.31 ml   Filed Weights   03/15/20 0302 03/16/20 0444 03/17/20 0439  Weight: 79.2 kg 77.1 kg 77 kg    Examination: General exam: Appears comfortable  HEENT: PERRLA, oral mucosa moist, no sclera icterus or thrush Respiratory system: Clear to auscultation. Respiratory effort normal. Cardiovascular system: S1 & S2 heard, RRR.   Gastrointestinal system: Abdomen soft, non-tender, nondistended. Normal bowel sounds. Central nervous system: Alert and oriented. No focal neurological deficits. Extremities: No cyanosis, clubbing or edema Skin: No rashes or ulcers Psychiatry:  Mood & affect appropriate.     Data Reviewed: I have personally reviewed following labs and imaging studies  CBC: Recent Labs  Lab 03/12/20 1414 03/13/20 0747 03/14/20 0452 03/15/20 0501  WBC 17.5* 15.4* 12.2* 13.4*  NEUTROABS 14.7* 12.5*  --   --   HGB 8.2* 8.8* 7.8* 8.4*  HCT 27.2* 28.6* 25.9* 27.3*  MCV 87.5 87.5 86.6 87.2  PLT 569* 589* 505* 979*   Basic Metabolic Panel: Recent Labs  Lab 03/13/20 0747 03/14/20 0452 03/15/20 0501 03/16/20 0736 03/17/20 0741  NA 141 140 141 141 139  K 4.0 3.8 3.7 3.8 3.9  CL 101 101 100 100 97*  CO2 27 29 29  32 30  GLUCOSE 176* 150* 181* 99 111*  BUN 22 27* 19 14 16   CREATININE 0.66 0.67 0.66 0.69 0.64  CALCIUM 9.1 8.5* 8.4* 8.7* 8.9  MG 1.7 1.7  --   --   --     GFR: Estimated Creatinine Clearance: 56.1 mL/min (by C-G formula based on SCr of 0.64 mg/dL). Liver Function Tests: Recent Labs  Lab 03/12/20 1414 03/13/20 0747 03/14/20 0452 03/15/20 0501  AST 892* 280* 144* 93*  ALT 428* 304* 219* 176*  ALKPHOS 127* 143* 93 98  BILITOT 0.5 0.5 0.5 0.4  PROT 6.7 6.9 5.7* 5.8*  ALBUMIN 2.5* 2.5* 2.1* 2.0*   Recent Labs  Lab 03/12/20 2013  LIPASE 19   No results for input(s): AMMONIA in the last 168 hours. Coagulation Profile: Recent Labs  Lab 03/13/20 0747  INR 1.3*   Cardiac Enzymes: No results for input(s): CKTOTAL, CKMB, CKMBINDEX, TROPONINI in the last 168 hours. BNP (last 3 results) No results  for input(s): PROBNP in the last 8760 hours. HbA1C: No results for input(s): HGBA1C in the last 72 hours. CBG: Recent Labs  Lab 03/16/20 1120 03/16/20 1637 03/16/20 2107 03/17/20 0609 03/17/20 1201  GLUCAP 108* 203* 91 101* 174*   Lipid Profile: No results for input(s): CHOL, HDL, LDLCALC, TRIG, CHOLHDL, LDLDIRECT in the last 72 hours. Thyroid Function Tests: No results for input(s): TSH, T4TOTAL, FREET4, T3FREE, THYROIDAB in the last 72 hours. Anemia Panel: No results for input(s): VITAMINB12, FOLATE, FERRITIN, TIBC, IRON, RETICCTPCT in the last 72 hours. Urine analysis: No results found for: COLORURINE, APPEARANCEUR, LABSPEC, PHURINE, GLUCOSEU, HGBUR, BILIRUBINUR, KETONESUR, PROTEINUR, UROBILINOGEN, NITRITE, LEUKOCYTESUR Sepsis Labs: @LABRCNTIP (procalcitonin:4,lacticidven:4) ) Recent Results (from the past 240 hour(s))  SARS CORONAVIRUS 2 (TAT 6-24 HRS) Nasopharyngeal Nasopharyngeal Swab     Status: None   Collection Time: 03/08/20 11:29 AM   Specimen: Nasopharyngeal Swab  Result Value Ref Range Status   SARS Coronavirus 2 NEGATIVE NEGATIVE Final    Comment: (NOTE) SARS-CoV-2 target nucleic acids are NOT DETECTED.  The SARS-CoV-2 RNA is generally detectable in upper and lower respiratory specimens during the acute  phase of infection. Negative results do not preclude SARS-CoV-2 infection, do not rule out co-infections with other pathogens, and should not be used as the sole basis for treatment or other patient management decisions. Negative results must be combined with clinical observations, patient history, and epidemiological information. The expected result is Negative.  Fact Sheet for Patients: SugarRoll.be  Fact Sheet for Healthcare Providers: https://www.woods-mathews.com/  This test is not yet approved or cleared by the [place] FDA and  has been authorized for detection and/or diagnosis of SARS-CoV-2 by FDA under an Emergency Use Authorization (EUA). This EUA will remain  in effect (meaning this test can be used) for the duration of the COVID-19 declaration under Se ction 564(b)(1) of the Act, 21 U.S.C. section 360bbb-3(b)(1), unless the authorization is terminated or revoked sooner.  Performed at [facility], [place] [address], [place], [place] [address]   SARS Coronavirus 2 by RT PCR (hospital order, performed in [facility] hospital lab) Nasopharyngeal Nasopharyngeal Swab     Status: None   Collection Time: 03/12/20 10:21 PM   Specimen: Nasopharyngeal Swab  Result Value Ref Range Status   SARS Coronavirus 2 NEGATIVE NEGATIVE Final    Comment: (NOTE) SARS-CoV-2 target nucleic acids are NOT DETECTED.  The SARS-CoV-2 RNA is generally detectable in upper and lower respiratory specimens during the acute phase of infection. The lowest concentration of SARS-CoV-2 viral copies this assay can detect is 250 copies / mL. A negative result does not preclude SARS-CoV-2 infection and should not be used as the sole basis for treatment or other patient management decisions.  A negative result may occur with improper specimen collection / handling, submission of specimen other than nasopharyngeal swab, presence of viral mutation(s) within  the areas targeted by this assay, and inadequate number of viral copies (<250 copies / mL). A negative result must be combined with clinical observations, patient history, and epidemiological information.  Fact Sheet for Patients:   StrictlyIdeas.no  Fact Sheet for Healthcare Providers: BankingDealers.co.za  This test is not yet approved or  cleared by the [place] FDA and has been authorized for detection and/or diagnosis of SARS-CoV-2 by FDA under an Emergency Use Authorization (EUA).  This EUA will remain in effect (meaning this test can be used) for the duration of the COVID-19 declaration under Section 564(b)(1) of the Act, 21 U.S.C. section 360bbb-3(b)(1),  unless the authorization is terminated or revoked sooner.  Performed at [facility], [place] [address], [place], [place] [address]   Culture, blood (routine x 2)     Status: None (Preliminary result)   Collection Time: 03/13/20  8:10 AM   Specimen: BLOOD  Result Value Ref Range Status   Specimen Description BLOOD RIGHT ANTECUBITAL  Final   Special Requests   Final    BOTTLES DRAWN AEROBIC AND ANAEROBIC Blood Culture adequate volume   Culture   Final    NO GROWTH 4 DAYS Performed at [facility], [place] [address], [place], [place] [address]    Report Status PENDING  Incomplete  Culture, blood (routine x 2)     Status: None (Preliminary result)   Collection Time: 03/13/20  8:18 AM   Specimen: BLOOD RIGHT HAND  Result Value Ref Range Status   Specimen Description BLOOD RIGHT HAND  Final   Special Requests   Final    BOTTLES DRAWN AEROBIC AND ANAEROBIC Blood Culture adequate volume   Culture   Final    NO GROWTH 4 DAYS Performed at [facility], [place] [address] [place], [place] [address]    Report Status PENDING  Incomplete         Radiology Studies: US THYROID  Result Date: 03/17/2020 CLINICAL DATA:  86 year old with thyroid nodules. Prior  biopsy to right inferior thyroid nodule and left mid thyroid nodule on 10/07/2015. EXAM: THYROID ULTRASOUND TECHNIQUE: Ultrasound examination of the thyroid gland and adjacent soft tissues was performed. COMPARISON:  09/13/2017 and 10/01/2015 FINDINGS: Parenchymal Echotexture: Markedly heterogenous Isthmus: 1.0 cm, previously 0.5 cm Right lobe: 5.0 x 2.2 x 2.8 cm, previously 8.1 x 5.1 x 2.9 cm Left lobe: 6.1 x 2.8 x 2.7 cm, previously 5.6 x 2.5 x 3.0 cm _________________________________________________________ Estimated total number of nodules >/= 1 cm: 5 Number of spongiform nodules >/=  2 cm not described below (TR1): 0 Number of mixed cystic and solid nodules >/= 1.5 cm not described below (TR2): 0 _________________________________________________________ Nodule 1 in the right mid thyroid lobe is isoechoic with some peripheral calcifications. This nodule measures 2.8 x 1.5 x 2.3 cm and measured 2.0 x 1.4 x 2.3 cm in 2017. This is compatible with a TR 4 nodule. Difficult to accurately compare these nodules due to heterogeneity throughout the thyroid tissue. Nodule 2 is hypoechoic heterogeneous nodule with calcifications in the right inferior thyroid lobe. This appears to represent the previously biopsied nodule. Nodule 2 measures 2.4 x 2.2 x 1.8 cm measured at least 1.9 cm in 2019. Nodule 3 in the left superior thyroid lobe is primarily isoechoic and measures 1.2 x 0.7 x 1.1 cm and previously measured up to 1.3 cm. Nodule 4 is compatible with the previously biopsied nodule in the left mid thyroid lobe. This nodule is heterogeneous with calcifications and measures 2.7 x 2.3 x 2.4 cm and reportedly previously measured 2.6 x 2.5 x 2.1 cm. Nodule 5 in the left inferior thyroid lobe is isoechoic and measures 2.3 x 2.0 x 2.1 cm, reportedly previously measured 2.1 x 2.1 x 1.7 cm. IMPRESSION: 1. Multinodular goiter. 2. Thyroid tissue is severely heterogeneous and difficult to accurately compare nodules between  examinations. Morphology of the previously biopsied nodules have not significantly changed. 3. Nodule 1 in the right mid thyroid lobe may represent a TR 4 nodule but difficult to know if this represents a conglomeration of nodules. If this is a single nodule, it would be  compatible with a TR 4 nodule and meet criteria for biopsy. However, this nodule has minimally enlarged since 2017 and favor a benign etiology. Favor surveillance rather than biopsy. 4. Nodule 5 in the left inferior thyroid lobe is compatible with a TR 3 nodule and meets criteria for follow-up. 5. If additional surveillance is clinically warranted, consider another follow-up in 1-2 years. The above is in keeping with the ACR TI-RADS recommendations - J Am Coll Radiol 2017;14:587-595. Electronically Signed   By: Markus Daft M.D.   On: 03/17/2020 11:37      Scheduled Meds: . amLODipine  2.5 mg Oral Daily  . aspirin EC  81 mg Oral Daily  . atorvastatin  20 mg Oral Daily  . carvedilol  25 mg Oral BID WC  . clopidogrel  75 mg Oral Daily  . doxycycline  100 mg Oral BID  . furosemide  40 mg Oral Daily  . gabapentin  300 mg Oral BID  . insulin aspart  0-15 Units Subcutaneous TID AC & HS  . insulin aspart  10 Units Subcutaneous TID WC  . insulin glargine  35 Units Subcutaneous QPM  . lisinopril  20 mg Oral QPM  . PARoxetine  20 mg Oral Daily  . potassium chloride  40 mEq Oral BID  . sodium chloride flush  3 mL Intravenous Q12H   Continuous Infusions: . sodium chloride    . ceFEPime (MAXIPIME) IV 2 g (03/17/20 1043)     LOS: 4 days      Debbe Odea, MD [facility] Pager: [email or web address] 03/17/2020, 4:16 PM

## 2020-03-17 NOTE — NC FL2 (Signed)
[place] LEVEL OF CARE SCREENING TOOL     IDENTIFICATION  Patient Name: [name] Birthdate: July 16, 1932 Sex: female Admission Date (Current Location): 03/12/2020  [facility] and [place] Number:  Herbalist and Address:  [facility] [place], [place] [address] [place], [place] [address]      Provider Number: [id number]  Attending Physician Name and Address:  Debbe Odea, MD  Relative Name and Phone Number:  Jonni Sanger [phone]    Current Level of Care: Hospital Recommended Level of Care: [place] Prior Approval Number:    Date Approved/Denied:   PASRR Number: [id number]  Discharge Plan: SNF    Current Diagnoses: Patient Active Problem List   Diagnosis Date Noted  . Elevated troponin level not due myocardial infarction 03/13/2020  . GERD without esophagitis 03/13/2020  . Anemia 03/13/2020  . Thyroid goiter 03/13/2020  . Acute respiratory failure with hypoxia ([place]) 03/13/2020  . Type 2 diabetes mellitus with complication, without long-term current use of insulin ([place]) 03/13/2020  . Elevated liver enzymes 03/13/2020  . Acute on chronic congestive heart failure ([place]) 03/13/2020  . Acute cardiogenic pulmonary edema (HCC) 03/12/2020  . Diabetic ulcer of right foot ([place]) 03/12/2020  . Achilles tendon contracture, left 07/24/2016  . Ulcer of left foot, limited to breakdown of skin ([place]) 06/01/2016  . Cellulitis 10/27/2015  . Severe peripheral arterial disease ([place]) 12/28/2014  . Chronic diastolic heart failure ([place]) 12/30/2013  . Edema 12/30/2013  . Mixed hyperlipidemia 06/24/2013  . Mitral valve disorders(424.0) 06/24/2013  . Coronary artery disease involving native coronary artery of native heart   . Essential hypertension   . Diabetes mellitus without complication ([place])   . Other primary cardiomyopathies     Orientation RESPIRATION BLADDER Height & Weight     Self, Time, Situation, Place  Normal External  catheter Weight: 169 lb 12.1 oz (77 kg) Height:  5\' 8"  (172.7 cm)  BEHAVIORAL SYMPTOMS/MOOD NEUROLOGICAL BOWEL NUTRITION STATUS  Dangerous to self, others or property   Continent Diet (see dc summary)  AMBULATORY STATUS COMMUNICATION OF NEEDS Skin   Limited Assist Verbally Surgical wounds (Closed incision right foot)                       Personal Care Assistance Level of Assistance  Bathing, Feeding, Dressing Bathing Assistance: Limited assistance Feeding assistance: Independent Dressing Assistance: Limited assistance     Functional Limitations Info  Sight, Hearing, Speech Sight Info: Adequate Hearing Info: Adequate Speech Info: Adequate    SPECIAL CARE FACTORS FREQUENCY  PT (By licensed PT), OT (By licensed OT)     PT Frequency: 5x week OT Frequency: 5x week            Contractures Contractures Info: Not present    Additional Factors Info  Code Status, Allergies, Psychotropic, Insulin Sliding Scale Code Status Info: DNR Allergies Info: Sulfa Antibiotics Psychotropic Info: gabapentin (NEURONTIN) capsule 300 mg 2X daily, PARoxetine (PAXIL) tablet 20 mg daily Insulin Sliding Scale Info: insulin aspart (novoLOG) injection 0-15 Units 3x daily before meals and bedtime, insulin aspart (novoLOG) injection 10 Units 3x daily with meals,       Current Medications (03/17/2020):  This is the current hospital active medication list Current Facility-Administered Medications  Medication Dose Route Frequency Provider Last Rate Last Admin  . 0.9 %  sodium chloride infusion  250 mL Intravenous PRN Shalhoub, Sherryll Burger, MD      . acetaminophen (TYLENOL) tablet 650  mg  650 mg Oral Q4H PRN Vernelle Emerald, MD   650 mg at 03/16/20 2107  . aspirin EC tablet 81 mg  81 mg Oral Daily Georgette Shell, MD   81 mg at 03/17/20 0853  . atorvastatin (LIPITOR) tablet 20 mg  20 mg Oral Daily Shalhoub, Sherryll Burger, MD   20 mg at 03/17/20 0853  . carvedilol (COREG) tablet 25 mg  25 mg Oral BID  WC Shalhoub, Sherryll Burger, MD   25 mg at 03/17/20 1639  . ceFEPIme (MAXIPIME) 2 g in sodium chloride 0.9 % 100 mL IVPB  2 g Intravenous Q12H Wendee Beavers, RPH 200 mL/hr at 03/17/20 1043 2 g at 03/17/20 1043  . clopidogrel (PLAVIX) tablet 75 mg  75 mg Oral Daily Shalhoub, Sherryll Burger, MD   75 mg at 03/17/20 0853  . doxycycline (VIBRA-TABS) tablet 100 mg  100 mg Oral BID Vernelle Emerald, MD   100 mg at 03/17/20 0853  . furosemide (LASIX) tablet 40 mg  40 mg Oral Daily Geralynn Rile, MD   40 mg at 03/17/20 0853  . gabapentin (NEURONTIN) capsule 300 mg  300 mg Oral BID Georgette Shell, MD   300 mg at 03/17/20 0853  . insulin aspart (novoLOG) injection 0-15 Units  0-15 Units Subcutaneous TID AC & HS Georgette Shell, MD   3 Units at 03/17/20 1231  . insulin aspart (novoLOG) injection 10 Units  10 Units Subcutaneous TID WC Shalhoub, Sherryll Burger, MD   10 Units at 03/17/20 1232  . insulin glargine (LANTUS) injection 35 Units  35 Units Subcutaneous QPM Shalhoub, Sherryll Burger, MD   35 Units at 03/17/20 1639  . lisinopril (ZESTRIL) tablet 20 mg  20 mg Oral QPM Shalhoub, Sherryll Burger, MD   20 mg at 03/17/20 1639  . nitroGLYCERIN (NITROSTAT) SL tablet 0.4 mg  0.4 mg Sublingual Q5 min PRN Shalhoub, Sherryll Burger, MD      . ondansetron [facility]) injection 4 mg  4 mg Intravenous Q6H PRN Shalhoub, Sherryll Burger, MD      . oxyCODONE (Oxy IR/ROXICODONE) immediate release tablet 5 mg  5 mg Oral Q6H PRN Georgette Shell, MD   5 mg at 03/17/20 1326  . PARoxetine (PAXIL) tablet 20 mg  20 mg Oral Daily Shalhoub, Sherryll Burger, MD   20 mg at 03/17/20 0853  . polyethylene glycol (MIRALAX / GLYCOLAX) packet 17 g  17 g Oral Daily PRN Shalhoub, Sherryll Burger, MD      . Derrill Memo ON 03/18/2020] potassium chloride SA (KLOR-CON) CR tablet 40 mEq  40 mEq Oral Daily Rizwan, Saima, MD      . sodium chloride flush (NS) 0.9 % injection 3 mL  3 mL Intravenous Q12H Shalhoub, Sherryll Burger, MD   3 mL at 03/17/20 0903  . sodium chloride flush (NS) 0.9 % injection  3 mL  3 mL Intravenous PRN Shalhoub, Sherryll Burger, MD      . traMADol Veatrice Bourbon) tablet 50 mg  50 mg Oral Q6H PRN Georgette Shell, MD   50 mg at 03/17/20 1639     Discharge Medications: Please see discharge summary for a list of discharge medications.  Relevant Imaging Results:  Relevant Lab Results:   Additional Information ssn [id number]  Loreta Ave, LCSWA

## 2020-03-17 NOTE — NC FL2 (Deleted)
[place] LEVEL OF CARE SCREENING TOOL     IDENTIFICATION  Patient Name: [name] Birthdate: Nov 29, 1931 Sex: female Admission Date (Current Location): 03/12/2020  [facility] and [place] Number:      Facility and Address:         Provider Number:    Attending Physician Name and Address:  Debbe Odea, MD  Relative Name and Phone Number:       Current Level of Care:   Recommended Level of Care:   Prior Approval Number:    Date Approved/Denied:   PASRR Number:    Discharge Plan:      Current Diagnoses: Patient Active Problem List   Diagnosis Date Noted  . Elevated troponin level not due myocardial infarction 03/13/2020  . GERD without esophagitis 03/13/2020  . Anemia 03/13/2020  . Thyroid goiter 03/13/2020  . Acute respiratory failure with hypoxia ([place]) 03/13/2020  . Type 2 diabetes mellitus with complication, without long-term current use of insulin ([place]) 03/13/2020  . Elevated liver enzymes 03/13/2020  . Acute on chronic congestive heart failure ([place]) 03/13/2020  . Acute cardiogenic pulmonary edema (H[facility]) 03/12/2020  . Diabetic ulcer of right foot ([place]) 03/12/2020  . Achilles tendon contracture, left 07/24/2016  . Ulcer of left foot, limited to breakdown of skin ([place]) 06/01/2016  . Cellulitis 10/27/2015  . Severe peripheral arterial disease ([place]) 12/28/2014  . Chronic diastolic heart failure ([place]) 12/30/2013  . Edema 12/30/2013  . Mixed hyperlipidemia 06/24/2013  . Mitral valve disorders(424.0) 06/24/2013  . Coronary artery disease involving native coronary artery of native heart   . Essential hypertension   . Diabetes mellitus without complication ([place])   . Other primary cardiomyopathies     Orientation RESPIRATION BLADDER Height & Weight            Weight: 169 lb 12.1 oz (77 kg) Height:  5\' 8"  (172.7 cm)  BEHAVIORAL SYMPTOMS/MOOD NEUROLOGICAL BOWEL NUTRITION STATUS           AMBULATORY STATUS COMMUNICATION OF NEEDS Skin                                Personal Care Assistance Level of Assistance              Functional Limitations Info             SPECIAL CARE FACTORS FREQUENCY                       Contractures      Additional Factors Info                  Current Medications (03/17/2020):  This is the current hospital active medication list Current Facility-Administered Medications  Medication Dose Route Frequency Provider Last Rate Last Admin  . 0.9 %  sodium chloride infusion  250 mL Intravenous PRN Shalhoub, Sherryll Burger, MD      . acetaminophen (TYLENOL) tablet 650 mg  650 mg Oral Q4H PRN Vernelle Emerald, MD   650 mg at 03/16/20 2107  . aspirin EC tablet 81 mg  81 mg Oral Daily Georgette Shell, MD   81 mg at 03/17/20 0853  . atorvastatin (LIPITOR) tablet 20 mg  20 mg Oral Daily Shalhoub, Sherryll Burger, MD   20 mg at 03/17/20 0853  . carvedilol (COREG) tablet 25 mg  25 mg Oral BID WC Shalhoub, Sherryll Burger, MD   25  mg at 03/17/20 1639  . ceFEPIme (MAXIPIME) 2 g in sodium chloride 0.9 % 100 mL IVPB  2 g Intravenous Q12H Wendee Beavers, RPH 200 mL/hr at 03/17/20 1043 2 g at 03/17/20 1043  . clopidogrel (PLAVIX) tablet 75 mg  75 mg Oral Daily Shalhoub, Sherryll Burger, MD   75 mg at 03/17/20 0853  . doxycycline (VIBRA-TABS) tablet 100 mg  100 mg Oral BID Vernelle Emerald, MD   100 mg at 03/17/20 0853  . furosemide (LASIX) tablet 40 mg  40 mg Oral Daily Geralynn Rile, MD   40 mg at 03/17/20 0853  . gabapentin (NEURONTIN) capsule 300 mg  300 mg Oral BID Georgette Shell, MD   300 mg at 03/17/20 0853  . insulin aspart (novoLOG) injection 0-15 Units  0-15 Units Subcutaneous TID AC & HS Georgette Shell, MD   3 Units at 03/17/20 1231  . insulin aspart (novoLOG) injection 10 Units  10 Units Subcutaneous TID WC Shalhoub, Sherryll Burger, MD   10 Units at 03/17/20 1232  . insulin glargine (LANTUS) injection 35 Units  35 Units Subcutaneous QPM Shalhoub, Sherryll Burger, MD   35 Units at 03/17/20 1639  .  lisinopril (ZESTRIL) tablet 20 mg  20 mg Oral QPM Shalhoub, Sherryll Burger, MD   20 mg at 03/17/20 1639  . nitroGLYCERIN (NITROSTAT) SL tablet 0.4 mg  0.4 mg Sublingual Q5 min PRN Shalhoub, Sherryll Burger, MD      . ondansetron [facility]) injection 4 mg  4 mg Intravenous Q6H PRN Shalhoub, Sherryll Burger, MD      . oxyCODONE (Oxy IR/ROXICODONE) immediate release tablet 5 mg  5 mg Oral Q6H PRN Georgette Shell, MD   5 mg at 03/17/20 1326  . PARoxetine (PAXIL) tablet 20 mg  20 mg Oral Daily Shalhoub, Sherryll Burger, MD   20 mg at 03/17/20 0853  . polyethylene glycol (MIRALAX / GLYCOLAX) packet 17 g  17 g Oral Daily PRN Shalhoub, Sherryll Burger, MD      . Derrill Memo ON 03/18/2020] potassium chloride SA (KLOR-CON) CR tablet 40 mEq  40 mEq Oral Daily Rizwan, Saima, MD      . sodium chloride flush (NS) 0.9 % injection 3 mL  3 mL Intravenous Q12H Shalhoub, Sherryll Burger, MD   3 mL at 03/17/20 0903  . sodium chloride flush (NS) 0.9 % injection 3 mL  3 mL Intravenous PRN Shalhoub, Sherryll Burger, MD      . traMADol Veatrice Bourbon) tablet 50 mg  50 mg Oral Q6H PRN Georgette Shell, MD   50 mg at 03/17/20 1639     Discharge Medications: Please see discharge summary for a list of discharge medications.  Relevant Imaging Results:  Relevant Lab Results:   Additional Information    Makiyla Linch B Ade Stmarie, LCSWA

## 2020-03-17 NOTE — Progress Notes (Deleted)
03/17/20 1215  PT Visit Information  Last PT Received On 03/17/20  Assistance Needed +1  History of Present Illness pt is a 86 yo female who is recently s/p R transmetatarsal amputation was admitted secondary to  SOB and orthopnea. Pt was noted to have bilateral pleural effusions. Pt is thought to have new onset CHF. Pt has a PMH of CAD, HTN, HLD, T2DM, PVD, Anemia, ARF, CHF and angioplasty of BLE in 2019.   Subjective Data  Subjective feeling okay, foot has some pain when I walk on it  Patient Stated Goal to be independent again  Precautions  Precautions Fall  Required Braces or Orthoses Other Brace  Other Brace CAM walker boot at all times per ortho.   Restrictions  Weight Bearing Restrictions Yes  RLE Weight Bearing WBAT  Other Position/Activity Restrictions Must have CAM boot on at all times  Pain Assessment  Pain Assessment No/denies pain  Cognition  Arousal/Alertness Awake/alert  Behavior During Therapy WFL for tasks assessed/performed  Overall Cognitive Status Within Functional Limits for tasks assessed  Bed Mobility  Overal bed mobility Needs Assistance  Bed Mobility Supine to Sit  Supine to sit Supervision  General bed mobility comments pt required supervision for safety   Transfers  Overall transfer level Needs assistance  Equipment used Rolling walker (2 wheeled)  Transfers Sit to/from Stand  Sit to Stand Min guard  General transfer comment pt required min guard assist for safety with RW for sit<>stand. Pt self cued on hand placement with transfer  Ambulation/Gait  Ambulation/Gait assistance Min assist  Gait Distance (Feet) 25 Feet  Assistive device Rolling walker (2 wheeled)  Gait Pattern/deviations Step-to pattern;Decreased step length - left;Decreased stance time - right;Trunk flexed  General Gait Details pt was slow and unsteady with gait. Notable progression with ambulation speed and steadiness, still required min assist. Pt requires extensive mulitmodal  cueing for RW sequencing and proximity to device  Gait velocity decreased  Balance  Overall balance assessment Needs assistance  Sitting-balance support Feet supported;No upper extremity supported  Sitting balance-Leahy Scale Good  Standing balance support Bilateral upper extremity supported;During functional activity  Standing balance-Leahy Scale Poor  Standing balance comment reliant on RW or external supports like leaning on sink surface  PT - End of Session  Equipment Utilized During Treatment Gait belt  Activity Tolerance Patient tolerated treatment well  Patient left with call Lamia Mariner/phone within reach;in bed;with bed alarm set  Nurse Communication Mobility status   PT - Assessment/Plan  PT Plan Current plan remains appropriate  PT Visit Diagnosis Unsteadiness on feet (R26.81);Other abnormalities of gait and mobility (R26.89);Muscle weakness (generalized) (M62.81);Difficulty in walking, not elsewhere classified (R26.2);Pain  Pain - Right/Left Right  Pain - part of body Ankle and joints of foot  PT Frequency (ACUTE ONLY) Min 2X/week  Recommendations for Other Services OT consult  Follow Up Recommendations SNF;Supervision for mobility/OOB  PT equipment Wheelchair (measurements PT);Wheelchair cushion (measurements PT)  AM-PAC PT "6 Clicks" Mobility Outcome Measure (Version 2)  Help needed turning from your back to your side while in a flat bed without using bedrails? 4  Help needed moving from lying on your back to sitting on the side of a flat bed without using bedrails? 4  Help needed moving to and from a bed to a chair (including a wheelchair)? 3  Help needed standing up from a chair using your arms (e.g., wheelchair or bedside chair)? 3  Help needed to walk in hospital room? 3  Help needed  climbing 3-5 steps with a railing?  2  6 Click Score 19  Consider Recommendation of Discharge To: Home with [facility]  PT Goal Progression  Progress towards PT goals Progressing toward goals  Acute  Rehab PT Goals  PT Goal Formulation With patient  Time For Goal Achievement 03/30/20  Potential to Achieve Goals Fair  PT Time Calculation  PT Start Time (ACUTE ONLY) 1130  PT Stop Time (ACUTE ONLY) 1150  PT Time Calculation (min) (ACUTE ONLY) 20 min    Pt is progressing well towards goals. Pt notably improved and required supervision to min assist with all mobility tasks. Pt would benefit from pre-gait exercises for step-to gait pattern with RW sequencing and proximity. Current plans remain appropriate at d/c. Pt would continue to benefit from acute therapy in order to increase her functional independence at home. Will continue to follow acutely.    Gloriann Loan, SPT  Acute Rehabilitation Services  Office: [phone]

## 2020-03-17 NOTE — Plan of Care (Signed)

## 2020-03-18 DIAGNOSIS — J189 Pneumonia, unspecified organism: Secondary | ICD-10-CM

## 2020-03-18 DIAGNOSIS — E118 Type 2 diabetes mellitus with unspecified complications: Secondary | ICD-10-CM

## 2020-03-18 LAB — BASIC METABOLIC PANEL
Anion gap: 9 (ref 5–15)
BUN: 18 mg/dL (ref 8–23)
CO2: 29 mmol/L (ref 22–32)
Calcium: 8.6 mg/dL — ABNORMAL LOW (ref 8.9–10.3)
Chloride: 100 mmol/L (ref 98–111)
Creatinine, Ser: 0.84 mg/dL (ref 0.44–1.00)
GFR calc Af Amer: >60 mL/min (ref >60)
GFR calc non Af Amer: >60 mL/min (ref >60)
Glucose, Bld: 113 mg/dL — ABNORMAL HIGH (ref 70–99)
Potassium: 4 mmol/L (ref 3.5–5.1)
Sodium: 138 mmol/L (ref 135–145)

## 2020-03-18 LAB — CBC
HCT: 27.8 % — ABNORMAL LOW (ref 36.0–46.0)
Hemoglobin: 8.2 g/dL — ABNORMAL LOW (ref 12.0–15.0)
MCH: 25.8 pg — ABNORMAL LOW (ref 26.0–34.0)
MCHC: 29.5 g/dL — ABNORMAL LOW (ref 30.0–36.0)
MCV: 87.4 fL (ref 80.0–100.0)
Platelets: 540 10*3/uL — ABNORMAL HIGH (ref 150–400)
RBC: 3.18 MIL/uL — ABNORMAL LOW (ref 3.87–5.11)
RDW: 15.6 % — ABNORMAL HIGH (ref 11.5–15.5)
WBC: 13.5 10*3/uL — ABNORMAL HIGH (ref 4.0–10.5)
nRBC: 0 % (ref 0.0–0.2)

## 2020-03-18 LAB — GLUCOSE, CAPILLARY
Glucose-Capillary: 104 mg/dL — ABNORMAL HIGH (ref 70–99)
Glucose-Capillary: 176 mg/dL — ABNORMAL HIGH (ref 70–99)
Glucose-Capillary: 75 mg/dL (ref 70–99)
Glucose-Capillary: 124 mg/dL — ABNORMAL HIGH (ref 70–99)

## 2020-03-18 LAB — CULTURE, BLOOD (ROUTINE X 2)
Culture: NO GROWTH
Culture: NO GROWTH
Special Requests: ADEQUATE
Special Requests: ADEQUATE

## 2020-03-18 MED ORDER — FUROSEMIDE 10 MG/ML IJ SOLN
40.0000 mg | Freq: Four times a day (QID) | INTRAMUSCULAR | Status: AC
  Administered 2020-03-18 – 2020-03-19 (×3): 40 mg via INTRAVENOUS
  Filled 2020-03-18 (×3): qty 4

## 2020-03-18 NOTE — Progress Notes (Signed)
  Pt c/o increased pain to right ankle area after receiving Tramadol. Oxyi IR given awaiting results.

## 2020-03-18 NOTE — Progress Notes (Signed)
PROGRESS NOTE    [name]   XIH:[id number]  DOB: 30-Sep-1931  DOA: 03/12/2020 PCP: Marton Redwood, MD   Brief Narrative:  [name] 86 year old female with past medical history of coronary artery disease(cath 2014 with stent to circumflex, CTO of RCA with collaterals), IDDMtype II, peripheral vascular disease(S/P arthrectomy and angioplasty of left peroneal and left superficial femoral 04/2018),multiple diabetic foot ulcers, gastroesophageal reflux disease, hypothyroidism, hypertension who presents to [facility] emergency department complaints of shortness of breath. She underwent a right transmetatarsal amputation and achilles tendon lengthening on 8/26 and presented to the ED  with dyspnea with acute onset in the middle of the night=  found to be hypoxic in the ED. She was diagnosed with acute CHF.   Subjective: No new complaints.     Assessment & Plan:   Principal Problem: Acute hypoxic respiratory failure:  (A)  Acute cardiogenic pulmonary edema  - Dyspnea on exertion, hypoxia, CXR revealing pulmonary edema and BNP 1548 - Echocardiogram-EF 45 to 50% left ventricle mildly decreased function left ventricle also demonstrate global hypokinesis.  Moderate mitral stenosis and MR. - cardiology team following - dyspnea has improved- Lasix transitioned to oral 40 mg daily on 8/31 - 9/2> coarse crackles on exam again today- 81% on 5 L per RN- weight increased from 169 to 174 today-  will give more IV Lasix today- 40 IV every 6 hrs x 3 doses- reassess in AM - cont Coreg  (B) possible pneumonia - CT chest 8/30>Small bilateral pleural effusions with collapse/consolidation in both lower lobes. Dependent ground-glass is seen as well. She was started on Cefepime on 8/30 - she is already on Doxycycline   Active Problems: S/p transmetatarsal amputation on 03/11/20 for diabetic foot ulcer - recommended to continue Doxycycline x 14 days (start date 8/26) - per ortho who did an  eval on 8/31, she will need Cam walking boot at all times - PT recommends SNF- she is in agreement    Coronary artery disease involving native coronary artery of native heart/ PAD - cont ASA and Plavix     GERD without esophagitis - on Protonix as outpt     Type 2 diabetes mellitus with complication - on Humalog and Glargine as outpt - sugars well controlled in hospital  Thyroid Goiter - cont outpatient follow up- CT chest on 8/30 reveals this goiter again- see below report  Time spent in minutes: 35 DVT prophylaxis: SCDs Start: 03/13/20 0021  Code Status: DNR Family Communication:  Disposition Plan:  Status is: Inpatient  Remains inpatient appropriate because:cont to treat for HCAP for 2 more days- restart IV Lasix   Dispo: The patient is from: Home              Anticipated d/c is to: SNF              Anticipated d/c date is: 2 days              Patient currently is not medically stable to d/c.   Consultants:   Cardiology  Orthopedic surgery Procedures:   2 D ECHO 1. Left ventricular ejection fraction, by estimation, is 45 to 50%. The  left ventricle has mildly decreased function. The left ventricle  demonstrates global hypokinesis. There is mild concentric left ventricular  hypertrophy. Left ventricular diastolic  function could not be evaluated.  2. Right ventricular systolic function is normal. The right ventricular  size is normal. Tricuspid regurgitation signal is inadequate for assessing  PA  pressure.  3. MVA by VTI 1.50 cm2. The mitral valve is degenerative. Moderate mitral  valve regurgitation. Moderate mitral stenosis. The mean mitral valve  gradient is 4.0 mmHg with average heart rate of 79 bpm.  4. The aortic valve is tricuspid. Aortic valve regurgitation is not  visualized. Mild to moderate aortic valve sclerosis/calcification is  present, without any evidence of aortic stenosis.  5. The inferior vena cava is dilated in size with >50%  respiratory  variability, suggesting right atrial pressure of 8 mmHg.  Antimicrobials:  Anti-infectives (From admission, onward)   Start     Dose/Rate Route Frequency Ordered Stop   03/15/20 1300  ceFEPIme (MAXIPIME) 2 g in sodium chloride 0.9 % 100 mL IVPB        2 g 200 mL/hr over 30 Minutes Intravenous Every 12 hours 03/15/20 1153     03/13/20 0100  doxycycline (VIBRA-TABS) tablet 100 mg        100 mg Oral 2 times daily 03/13/20 0020         Objective: Vitals:   03/17/20 1635 03/17/20 1947 03/18/20 0443 03/18/20 0835  BP: 135/61 128/68 (!) 121/59 123/61  Pulse: 70 77 60 67  Resp: 20 19 15    Temp: 98.3 F (36.8 C) 98.3 F (36.8 C) 97.8 F (36.6 C)   TempSrc: Oral Oral Oral   SpO2: 99% 97% 96%   Weight:   79 kg   Height:        Intake/Output Summary (Last 24 hours) at 03/18/2020 1526 Last data filed at 03/18/2020 1328 Gross per 24 hour  Intake 1000 ml  Output 1650 ml  Net -650 ml   Filed Weights   03/16/20 0444 03/17/20 0439 03/18/20 0443  Weight: 77.1 kg 77 kg 79 kg    Examination: General exam: Appears comfortable  HEENT: PERRLA, oral mucosa moist, no sclera icterus or thrush Respiratory system: b/l basilar crackles- Respiratory effort normal. Cardiovascular system: S1 & S2 heard,  No murmurs  Gastrointestinal system: Abdomen soft, non-tender, nondistended. Normal bowel sounds   Central nervous system: Alert and oriented. No focal neurological deficits. Extremities: No cyanosis, clubbing or edema Skin: No rashes or ulcers Psychiatry:  Mood & affect appropriate.     Data Reviewed: I have personally reviewed following labs and imaging studies  CBC: Recent Labs  Lab 03/12/20 1414 03/13/20 0747 03/14/20 0452 03/15/20 0501 03/18/20 0459  WBC 17.5* 15.4* 12.2* 13.4* 13.5*  NEUTROABS 14.7* 12.5*  --   --   --   HGB 8.2* 8.8* 7.8* 8.4* 8.2*  HCT 27.2* 28.6* 25.9* 27.3* 27.8*  MCV 87.5 87.5 86.6 87.2 87.4  PLT 569* 589* 505* 537* 237*   Basic Metabolic  Panel: Recent Labs  Lab 03/13/20 0747 03/13/20 0747 03/14/20 0452 03/15/20 0501 03/16/20 0736 03/17/20 0741 03/18/20 0459  NA 141   < > 140 141 141 139 138  K 4.0   < > 3.8 3.7 3.8 3.9 4.0  CL 101   < > 101 100 100 97* 100  CO2 27   < > 29 29 32 30 29  GLUCOSE 176*   < > 150* 181* 99 111* 113*  BUN 22   < > 27* 19 14 16 18   CREATININE 0.66   < > 0.67 0.66 0.69 0.64 0.84  CALCIUM 9.1   < > 8.5* 8.4* 8.7* 8.9 8.6*  MG 1.7  --  1.7  --   --   --   --    < > =  values in this interval not displayed.   GFR: Estimated Creatinine Clearance: 54 mL/min (by C-G formula based on SCr of 0.84 mg/dL). Liver Function Tests: Recent Labs  Lab 03/12/20 1414 03/13/20 0747 03/14/20 0452 03/15/20 0501  AST 892* 280* 144* 93*  ALT 428* 304* 219* 176*  ALKPHOS 127* 143* 93 98  BILITOT 0.5 0.5 0.5 0.4  PROT 6.7 6.9 5.7* 5.8*  ALBUMIN 2.5* 2.5* 2.1* 2.0*   Recent Labs  Lab 03/12/20 2013  LIPASE 19   No results for input(s): AMMONIA in the last 168 hours. Coagulation Profile: Recent Labs  Lab 03/13/20 0747  INR 1.3*   Cardiac Enzymes: No results for input(s): CKTOTAL, CKMB, CKMBINDEX, TROPONINI in the last 168 hours. BNP (last 3 results) No results for input(s): PROBNP in the last 8760 hours. HbA1C: No results for input(s): HGBA1C in the last 72 hours. CBG: Recent Labs  Lab 03/17/20 2109 03/17/20 2147 03/17/20 2252 03/18/20 0622 03/18/20 1236  GLUCAP 79 74 122* 104* 176*   Lipid Profile: No results for input(s): CHOL, HDL, LDLCALC, TRIG, CHOLHDL, LDLDIRECT in the last 72 hours. Thyroid Function Tests: No results for input(s): TSH, T4TOTAL, FREET4, T3FREE, THYROIDAB in the last 72 hours. Anemia Panel: No results for input(s): VITAMINB12, FOLATE, FERRITIN, TIBC, IRON, RETICCTPCT in the last 72 hours. Urine analysis: No results found for: COLORURINE, APPEARANCEUR, LABSPEC, PHURINE, GLUCOSEU, HGBUR, BILIRUBINUR, KETONESUR, PROTEINUR, UROBILINOGEN, NITRITE,  LEUKOCYTESUR Sepsis Labs: @LABRCNTIP (procalcitonin:4,lacticidven:4) ) Recent Results (from the past 240 hour(s))  SARS Coronavirus 2 by RT PCR (hospital order, performed in [facility] hospital lab) Nasopharyngeal Nasopharyngeal Swab     Status: None   Collection Time: 03/12/20 10:21 PM   Specimen: Nasopharyngeal Swab  Result Value Ref Range Status   SARS Coronavirus 2 NEGATIVE NEGATIVE Final    Comment: (NOTE) SARS-CoV-2 target nucleic acids are NOT DETECTED.  The SARS-CoV-2 RNA is generally detectable in upper and lower respiratory specimens during the acute phase of infection. The lowest concentration of SARS-CoV-2 viral copies this assay can detect is 250 copies / mL. A negative result does not preclude SARS-CoV-2 infection and should not be used as the sole basis for treatment or other patient management decisions.  A negative result may occur with improper specimen collection / handling, submission of specimen other than nasopharyngeal swab, presence of viral mutation(s) within the areas targeted by this assay, and inadequate number of viral copies (<250 copies / mL). A negative result must be combined with clinical observations, patient history, and epidemiological information.  Fact Sheet for Patients:   StrictlyIdeas.no  Fact Sheet for Healthcare Providers: BankingDealers.co.za  This test is not yet approved or  cleared by the [place] FDA and has been authorized for detection and/or diagnosis of SARS-CoV-2 by FDA under an Emergency Use Authorization (EUA).  This EUA will remain in effect (meaning this test can be used) for the duration of the COVID-19 declaration under Section 564(b)(1) of the Act, 21 U.S.C. section 360bbb-3(b)(1), unless the authorization is terminated or revoked sooner.  Performed at [facility], [place] [address] [place], [place] [address]   Culture, blood (routine x 2)     Status: None    Collection Time: 03/13/20  8:10 AM   Specimen: BLOOD  Result Value Ref Range Status   Specimen Description BLOOD RIGHT ANTECUBITAL  Final   Special Requests   Final    BOTTLES DRAWN AEROBIC AND ANAEROBIC Blood Culture adequate volume   Culture   Final    NO  GROWTH 5 DAYS Performed at [facility], [place] [address], [place], [place] [address]    Report Status 03/18/2020 FINAL  Final  Culture, blood (routine x 2)     Status: None   Collection Time: 03/13/20  8:18 AM   Specimen: BLOOD RIGHT HAND  Result Value Ref Range Status   Specimen Description BLOOD RIGHT HAND  Final   Special Requests   Final    BOTTLES DRAWN AEROBIC AND ANAEROBIC Blood Culture adequate volume   Culture   Final    NO GROWTH 5 DAYS Performed at [facility], [place] [address] [place], [place] [address]    Report Status 03/18/2020 FINAL  Final  SARS Coronavirus 2 by RT PCR (hospital order, performed in [facility] hospital lab) Nasopharyngeal Nasopharyngeal Swab     Status: None   Collection Time: 03/17/20  5:39 PM   Specimen: Nasopharyngeal Swab  Result Value Ref Range Status   SARS Coronavirus 2 NEGATIVE NEGATIVE Final    Comment: (NOTE) SARS-CoV-2 target nucleic acids are NOT DETECTED.  The SARS-CoV-2 RNA is generally detectable in upper and lower respiratory specimens during the acute phase of infection. The lowest concentration of SARS-CoV-2 viral copies this assay can detect is 250 copies / mL. A negative result does not preclude SARS-CoV-2 infection and should not be used as the sole basis for treatment or other patient management decisions.  A negative result may occur with improper specimen collection / handling, submission of specimen other than nasopharyngeal swab, presence of viral mutation(s) within the areas targeted by this assay, and inadequate number of viral copies (<250 copies / mL). A negative result must be combined with clinical observations, patient history, and epidemiological  information.  Fact Sheet for Patients:   StrictlyIdeas.no  Fact Sheet for Healthcare Providers: BankingDealers.co.za  This test is not yet approved or  cleared by the [place] FDA and has been authorized for detection and/or diagnosis of SARS-CoV-2 by FDA under an Emergency Use Authorization (EUA).  This EUA will remain in effect (meaning this test can be used) for the duration of the COVID-19 declaration under Section 564(b)(1) of the Act, 21 U.S.C. section 360bbb-3(b)(1), unless the authorization is terminated or revoked sooner.  Performed at [facility], [place] [address] [place], [place] [address]          Radiology Studies: US THYROID  Result Date: 03/17/2020 CLINICAL DATA:  86 year old with thyroid nodules. Prior biopsy to right inferior thyroid nodule and left mid thyroid nodule on 10/07/2015. EXAM: THYROID ULTRASOUND TECHNIQUE: Ultrasound examination of the thyroid gland and adjacent soft tissues was performed. COMPARISON:  09/13/2017 and 10/01/2015 FINDINGS: Parenchymal Echotexture: Markedly heterogenous Isthmus: 1.0 cm, previously 0.5 cm Right lobe: 5.0 x 2.2 x 2.8 cm, previously 8.1 x 5.1 x 2.9 cm Left lobe: 6.1 x 2.8 x 2.7 cm, previously 5.6 x 2.5 x 3.0 cm _________________________________________________________ Estimated total number of nodules >/= 1 cm: 5 Number of spongiform nodules >/=  2 cm not described below (TR1): 0 Number of mixed cystic and solid nodules >/= 1.5 cm not described below (TR2): 0 _________________________________________________________ Nodule 1 in the right mid thyroid lobe is isoechoic with some peripheral calcifications. This nodule measures 2.8 x 1.5 x 2.3 cm and measured 2.0 x 1.4 x 2.3 cm in 2017. This is compatible with a TR 4 nodule. Difficult to accurately compare these nodules due to heterogeneity throughout the thyroid tissue. Nodule 2 is hypoechoic heterogeneous nodule with  calcifications in  the right inferior thyroid lobe. This appears to represent the previously biopsied nodule. Nodule 2 measures 2.4 x 2.2 x 1.8 cm measured at least 1.9 cm in 2019. Nodule 3 in the left superior thyroid lobe is primarily isoechoic and measures 1.2 x 0.7 x 1.1 cm and previously measured up to 1.3 cm. Nodule 4 is compatible with the previously biopsied nodule in the left mid thyroid lobe. This nodule is heterogeneous with calcifications and measures 2.7 x 2.3 x 2.4 cm and reportedly previously measured 2.6 x 2.5 x 2.1 cm. Nodule 5 in the left inferior thyroid lobe is isoechoic and measures 2.3 x 2.0 x 2.1 cm, reportedly previously measured 2.1 x 2.1 x 1.7 cm. IMPRESSION: 1. Multinodular goiter. 2. Thyroid tissue is severely heterogeneous and difficult to accurately compare nodules between examinations. Morphology of the previously biopsied nodules have not significantly changed. 3. Nodule 1 in the right mid thyroid lobe may represent a TR 4 nodule but difficult to know if this represents a conglomeration of nodules. If this is a single nodule, it would be compatible with a TR 4 nodule and meet criteria for biopsy. However, this nodule has minimally enlarged since 2017 and favor a benign etiology. Favor surveillance rather than biopsy. 4. Nodule 5 in the left inferior thyroid lobe is compatible with a TR 3 nodule and meets criteria for follow-up. 5. If additional surveillance is clinically warranted, consider another follow-up in 1-2 years. The above is in keeping with the ACR TI-RADS recommendations - J Am Coll Radiol 2017;14:587-595. Electronically Signed   By: Markus Daft M.D.   On: 03/17/2020 11:37      Scheduled Meds: . aspirin EC  81 mg Oral Daily  . atorvastatin  20 mg Oral Daily  . carvedilol  25 mg Oral BID WC  . clopidogrel  75 mg Oral Daily  . doxycycline  100 mg Oral BID  . furosemide  40 mg Oral Daily  . gabapentin  300 mg Oral BID  . insulin aspart  0-15 Units Subcutaneous TID  AC & HS  . insulin aspart  10 Units Subcutaneous TID WC  . insulin glargine  35 Units Subcutaneous QPM  . lisinopril  20 mg Oral QPM  . PARoxetine  20 mg Oral Daily  . potassium chloride  40 mEq Oral Daily  . sodium chloride flush  3 mL Intravenous Q12H   Continuous Infusions: . sodium chloride    . ceFEPime (MAXIPIME) IV 2 g (03/18/20 0930)     LOS: 5 days      Debbe Odea, MD [facility] Pager: [email or web address] 03/18/2020, 3:26 PM

## 2020-03-18 NOTE — Progress Notes (Signed)
Pharmacy Antibiotic Note  [name] is a 86 y.o. female admitted on 03/12/2020 with shortness of breath. Found to have acute cardiogenic pulmonary edema.     Pharmacy has been consulted on 8/31  for Cefepime dosing for pneumonia (HCAP). Also continues on oral doxycycline x 14 days started 8/26 prior to admission s/p right foot transmetatarsal amputation 2/2 gangrenous R foot ulcer done 8/26.   On 8/31 the MD noted  increased white blood cell count in spite of being on doxycycline and ongoing shortness of which is not better with diuresing, thus pharmacy consulted to start Cefepime dosing.   Afebrile, WBC 13.5 slightly dosen fro 13.4 on 8/30. SCr 0.84 , crcl 54 ml/min.   Plan: Continue Cefepime 2g IV q12h. Monitor clinical status, renal function and culture results daily.   MD continues Doxycylcine 100 mg po bid  x14 days per ortho post op right foot transmetatarsal amputation on 8/26. F/u LOT for doxy , 14 days should be last day on 9/8.     Height: 5\' 8"  (172.7 cm) Weight: 79 kg (174 lb 2.6 oz) IBW/kg (Calculated) : 63.9  Temp (24hrs), Avg:98.1 F (36.7 C), Min:97.8 F (36.6 C), Max:98.3 F (36.8 C)  Recent Labs  Lab 03/12/20 1414 03/12/20 1414 03/12/20 2013 03/13/20 0747 03/13/20 0747 03/14/20 0452 03/15/20 0501 03/16/20 0736 03/17/20 0741 03/18/20 0459  WBC 17.5*  --   --  15.4*  --  12.2* 13.4*  --   --  13.5*  CREATININE 0.95   < >  --  0.66   < > 0.67 0.66 0.69 0.64 0.84  LATICACIDVEN  --   --  1.8  --   --   --   --   --   --   --    < > = values in this interval not displayed.    Estimated Creatinine Clearance: 54 mL/min (by C-G formula based on SCr of 0.84 mg/dL).    Allergies  Allergen Reactions  . Sulfa Antibiotics Nausea And Vomiting    Severe vomiting.     Antimicrobials this admission: Cefepime 8/30>> Doxycycline 8/26 (pta)  >>   Dose adjustments this admission:   Microbiology results: 8/28 BCx: ngtd 8/27 Covid: neg  Thank you for allowing  pharmacy to be a part of this patient's care. Nicole Cella, RPh Clinical Pharmacist Please check AMION for all [place] phone numbers After 10:00 PM, call [place] [phone] 03/18/2020 3:09 PM

## 2020-03-19 ENCOUNTER — Inpatient Hospital Stay (HOSPITAL_COMMUNITY): Payer: HMO

## 2020-03-19 DIAGNOSIS — E782 Mixed hyperlipidemia: Secondary | ICD-10-CM | POA: Diagnosis not present

## 2020-03-19 DIAGNOSIS — E1165 Type 2 diabetes mellitus with hyperglycemia: Secondary | ICD-10-CM | POA: Diagnosis not present

## 2020-03-19 DIAGNOSIS — M2550 Pain in unspecified joint: Secondary | ICD-10-CM | POA: Diagnosis not present

## 2020-03-19 DIAGNOSIS — E161 Other hypoglycemia: Secondary | ICD-10-CM | POA: Diagnosis not present

## 2020-03-19 DIAGNOSIS — I739 Peripheral vascular disease, unspecified: Secondary | ICD-10-CM | POA: Diagnosis not present

## 2020-03-19 DIAGNOSIS — Z4781 Encounter for orthopedic aftercare following surgical amputation: Secondary | ICD-10-CM | POA: Diagnosis not present

## 2020-03-19 DIAGNOSIS — I501 Left ventricular failure: Secondary | ICD-10-CM | POA: Diagnosis not present

## 2020-03-19 DIAGNOSIS — I25111 Atherosclerotic heart disease of native coronary artery with angina pectoris with documented spasm: Secondary | ICD-10-CM | POA: Diagnosis not present

## 2020-03-19 DIAGNOSIS — J189 Pneumonia, unspecified organism: Secondary | ICD-10-CM | POA: Diagnosis not present

## 2020-03-19 DIAGNOSIS — I5032 Chronic diastolic (congestive) heart failure: Secondary | ICD-10-CM | POA: Diagnosis not present

## 2020-03-19 DIAGNOSIS — J9601 Acute respiratory failure with hypoxia: Secondary | ICD-10-CM | POA: Diagnosis not present

## 2020-03-19 DIAGNOSIS — Z7401 Bed confinement status: Secondary | ICD-10-CM | POA: Diagnosis not present

## 2020-03-19 DIAGNOSIS — M6281 Muscle weakness (generalized): Secondary | ICD-10-CM | POA: Diagnosis not present

## 2020-03-19 DIAGNOSIS — E1122 Type 2 diabetes mellitus with diabetic chronic kidney disease: Secondary | ICD-10-CM | POA: Diagnosis not present

## 2020-03-19 DIAGNOSIS — J811 Chronic pulmonary edema: Secondary | ICD-10-CM | POA: Diagnosis not present

## 2020-03-19 DIAGNOSIS — I2510 Atherosclerotic heart disease of native coronary artery without angina pectoris: Secondary | ICD-10-CM | POA: Diagnosis not present

## 2020-03-19 DIAGNOSIS — E118 Type 2 diabetes mellitus with unspecified complications: Secondary | ICD-10-CM | POA: Diagnosis not present

## 2020-03-19 DIAGNOSIS — I5043 Acute on chronic combined systolic (congestive) and diastolic (congestive) heart failure: Secondary | ICD-10-CM | POA: Diagnosis not present

## 2020-03-19 DIAGNOSIS — I509 Heart failure, unspecified: Secondary | ICD-10-CM

## 2020-03-19 DIAGNOSIS — E049 Nontoxic goiter, unspecified: Secondary | ICD-10-CM | POA: Diagnosis not present

## 2020-03-19 DIAGNOSIS — E11621 Type 2 diabetes mellitus with foot ulcer: Secondary | ICD-10-CM | POA: Diagnosis not present

## 2020-03-19 DIAGNOSIS — J810 Acute pulmonary edema: Secondary | ICD-10-CM | POA: Diagnosis not present

## 2020-03-19 DIAGNOSIS — R0902 Hypoxemia: Secondary | ICD-10-CM | POA: Diagnosis not present

## 2020-03-19 DIAGNOSIS — S98311D Complete traumatic amputation of right midfoot, subsequent encounter: Secondary | ICD-10-CM | POA: Diagnosis not present

## 2020-03-19 DIAGNOSIS — R0602 Shortness of breath: Secondary | ICD-10-CM | POA: Diagnosis not present

## 2020-03-19 DIAGNOSIS — E162 Hypoglycemia, unspecified: Secondary | ICD-10-CM | POA: Diagnosis not present

## 2020-03-19 DIAGNOSIS — I10 Essential (primary) hypertension: Secondary | ICD-10-CM | POA: Diagnosis not present

## 2020-03-19 DIAGNOSIS — J181 Lobar pneumonia, unspecified organism: Secondary | ICD-10-CM | POA: Diagnosis not present

## 2020-03-19 DIAGNOSIS — K219 Gastro-esophageal reflux disease without esophagitis: Secondary | ICD-10-CM | POA: Diagnosis not present

## 2020-03-19 LAB — CBC
HCT: 27 % — ABNORMAL LOW (ref 36.0–46.0)
Hemoglobin: 8.1 g/dL — ABNORMAL LOW (ref 12.0–15.0)
MCH: 26 pg (ref 26.0–34.0)
MCHC: 30 g/dL (ref 30.0–36.0)
MCV: 86.5 fL (ref 80.0–100.0)
Platelets: 532 10*3/uL — ABNORMAL HIGH (ref 150–400)
RBC: 3.12 MIL/uL — ABNORMAL LOW (ref 3.87–5.11)
RDW: 15.8 % — ABNORMAL HIGH (ref 11.5–15.5)
WBC: 12.7 10*3/uL — ABNORMAL HIGH (ref 4.0–10.5)
nRBC: 0 % (ref 0.0–0.2)

## 2020-03-19 LAB — GLUCOSE, CAPILLARY
Glucose-Capillary: 121 mg/dL — ABNORMAL HIGH (ref 70–99)
Glucose-Capillary: 136 mg/dL — ABNORMAL HIGH (ref 70–99)
Glucose-Capillary: 58 mg/dL — ABNORMAL LOW (ref 70–99)
Glucose-Capillary: 66 mg/dL — ABNORMAL LOW (ref 70–99)
Glucose-Capillary: 72 mg/dL (ref 70–99)

## 2020-03-19 LAB — BASIC METABOLIC PANEL
Anion gap: 11 (ref 5–15)
BUN: 21 mg/dL (ref 8–23)
CO2: 28 mmol/L (ref 22–32)
Calcium: 8.9 mg/dL (ref 8.9–10.3)
Chloride: 96 mmol/L — ABNORMAL LOW (ref 98–111)
Creatinine, Ser: 0.97 mg/dL (ref 0.44–1.00)
GFR calc Af Amer: >60 mL/min (ref >60)
GFR calc non Af Amer: 53 mL/min — ABNORMAL LOW (ref >60)
Glucose, Bld: 125 mg/dL — ABNORMAL HIGH (ref 70–99)
Potassium: 4 mmol/L (ref 3.5–5.1)
Sodium: 135 mmol/L (ref 135–145)

## 2020-03-19 MED ORDER — POLYETHYLENE GLYCOL 3350 17 G PO PACK: 17.0000 g | PACK | Freq: Every day | ORAL | 0 refills | Status: DC | PRN

## 2020-03-19 MED ORDER — FUROSEMIDE 40 MG PO TABS
40.0000 mg | ORAL_TABLET | Freq: Every day | ORAL | Status: DC
Start: 2020-03-20 — End: 2020-05-03

## 2020-03-19 MED ORDER — POTASSIUM CHLORIDE CRYS ER 20 MEQ PO TBCR: 20.0000 meq | EXTENDED_RELEASE_TABLET | Freq: Every day | ORAL | Status: DC

## 2020-03-19 MED ORDER — ASPIRIN 81 MG PO TBEC
81.0000 mg | DELAYED_RELEASE_TABLET | Freq: Every day | ORAL | 11 refills | Status: DC
Start: 2020-03-20 — End: 2022-11-17

## 2020-03-19 MED ORDER — INSULIN ASPART 100 UNIT/ML ~~LOC~~ SOLN: 10.0000 [IU] | Freq: Three times a day (TID) | SUBCUTANEOUS | 11 refills | Status: DC

## 2020-03-19 MED ORDER — INSULIN GLARGINE 100 UNIT/ML ~~LOC~~ SOLN
35.0000 [IU] | Freq: Every evening | SUBCUTANEOUS | 11 refills | Status: DC
Start: 2020-03-19 — End: 2022-11-17

## 2020-03-19 MED ORDER — GABAPENTIN 300 MG PO CAPS: 300.0000 mg | ORAL_CAPSULE | Freq: Two times a day (BID) | ORAL | Status: DC

## 2020-03-19 MED ORDER — ACETAMINOPHEN 325 MG PO TABS: 650.0000 mg | ORAL_TABLET | ORAL | Status: DC | PRN

## 2020-03-19 NOTE — Progress Notes (Signed)
OT Cancellation Note  Patient Details Name: CHARLIENE INOUE MRN: [id number] DOB: 11-01-1931   Cancelled Treatment:    Reason Eval/Treat Not Completed: Patient at procedure or test/ unavailable (x ray at moment)  Jeri Modena 03/19/2020, 11:42 AM

## 2020-03-19 NOTE — Progress Notes (Signed)
Called report to [facility], spoke with [facility]. Awaiting transport.Marland Kitchen

## 2020-03-19 NOTE — Progress Notes (Signed)
D/C instructions printed and placed in packet at nurse's station. Tele and IV removed, tolerated well. Awaiting [facility] for transport.

## 2020-03-19 NOTE — Progress Notes (Signed)
Pts CBG 58, pt complaints of dizziness,but still alert and oriented. OJ was given, CBG rechecked 66. PT[facility] came but unable to transport pt if cbg is >70. Rechecked 72.

## 2020-03-19 NOTE — Progress Notes (Signed)
CSW received auth for SNF [id number] and auth for PT[facility] [id number].

## 2020-03-19 NOTE — TOC Transition Note (Signed)
Transition of Care [facility]) - CM/SW Discharge Note   Patient Details  Name: [name] MRN: [id number] Date of Birth: 1932-04-07  Transition of [facility] CM/SW Contact:  [address], [place] Phone Number: 03/19/2020, 1:15 PM   Clinical Narrative:    Patient will DC to: [place] Anticipated DC date: 03/19/20 Family notified: Ronny Flurry Transport by: Corey Harold   Per MD patient ready for DC to . RN to call report prior to discharge ([phone], room 117A). RN, patient, patient's family, and facility notified of DC. Discharge Summary and FL2 sent to facility. DC packet on chart. Ambulance transport requested for patient.   CSW will sign off for now as social work intervention is no longer needed. Please consult [place] again if new needs arise.     Final next level of care: Skilled Nursing Facility Barriers to Discharge: Barriers Resolved   Patient Goals and CMS Choice Patient states their goals for this hospitalization and ongoing recovery are:: Get better soon CMS Medicare.gov Compare Post Acute Care list provided to:: Patient Choice offered to / list presented to : Adult Children, Patient  Discharge Placement PASRR number recieved: 03/17/20            Patient chooses bed at: [facility] Patient to be transferred to facility by: Irvona Name of family member notified: Jonni Sanger Patient and family notified of of transfer: 03/19/20  Discharge Plan and Services In-house Referral: Clinical Social Work                                   Social Determinants of Health (SDOH) Interventions     Readmission Risk Interventions No flowsheet data found.

## 2020-03-19 NOTE — Plan of Care (Signed)

## 2020-03-19 NOTE — Discharge Summary (Signed)
Physician Discharge Summary  [name] LKG:[id number] DOB: Jun 07, 1932 DOA: 03/12/2020  PCP: Marton Redwood, MD  Admit date: 03/12/2020 Discharge date: 03/19/2020  Admitted From: home Disposition:  SNF   Recommendations for Outpatient Follow-up:  1. Daily weights- fluid restrict to 1200 cc/day- adjust Lasix to prevent recurrence of fluid overload 2. IS every 2 hrs- cont 2 L O2 on exertion- wean as able 3. CBC and Cmet in 1 wk   Discharge Condition:  stable   CODE STATUS:  DNR   Diet recommendation:  Heart healthy, low sodium, diabetic diet Consultations:  Cardiology  Orthopedic surgery  Procedures/Studies: . none   Discharge Diagnoses:  Principal Problem:    Acute respiratory failure with hypoxia    Active Problems:  Acute cardiogenic pulmonary edema (HCC)   HCAP (healthcare-associated pneumonia)   Coronary artery disease involving native coronary artery of native heart   Essential hypertension   Mixed hyperlipidemia   Severe peripheral arterial disease (HCC)   Diabetic ulcer of right foot (HCC)   Elevated troponin level not due myocardial infarction   GERD without esophagitis   Anemia   Thyroid goiter   Type 2 diabetes mellitus with complication, without long-term current use of insulin (HCC)   Elevated liver enzymes      Brief Summary: [name] 86 year old female with past medical history of coronary artery disease(cath 2014 with stent to circumflex, CTO of RCA with collaterals), IDDMtype II, peripheral vascular disease(S/P arthrectomy and angioplasty of left peroneal and left superficial femoral 04/2018),multiple diabetic foot ulcers, gastroesophageal reflux disease, hypothyroidism, hypertension who presents to [facility] emergency department complaints of shortness of breath. She underwent a right transmetatarsal amputation and achilles tendon lengthening on 8/26 and presented to the ED  with dyspnea with acute onset in the middle of the night=   found to be hypoxic in the ED. She was diagnosed with acute CHF.   Hospital Course:  Principal Problem: Acute hypoxic respiratory failure:  (A)  Acute cardiogenic pulmonary edema  - Dyspnea on exertion, hypoxia, CXR revealing pulmonary edema and BNP 1548 - Echocardiogram-EF 45 to 50% left ventricle mildly decreased function left ventricle also demonstrate global hypokinesis. Moderate mitral stenosis and MR. - she has been adequately diuresed- will need to limit oral fluid intake and continue oral Lasix, Lisinopril and Coreg - she continues to have faint crackles at bases but CXR today does not show any pulmonary edema - needing 2 L O2 on ambulation- likely due to atelectasis- continue this at facility and wean as able   (B) possible pneumonia - CT chest 8/30>Small bilateral pleural effusions with collapse/consolidation in both lower lobes. Dependent ground-glass is seen as well. She was treated with 5 days of Cefepime  - she is already on Doxycycline   Active Problems: Elevated Troponin - Troponin in 200 range is likely due to acute CHF and cardiac strain  S/p transmetatarsal amputation on 03/11/20 for diabetic foot ulcer - recommended to continue Doxycycline x 14 days (start date 8/26) - per ortho who did an eval on 8/31, she will need Cam walking boot at all times - PT recommends SNF- she is in agreement  Elevated LFTs    Ref. Range 03/13/2020 07:47 03/14/2020 04:52 03/15/2020 05:01  AST Latest Ref Range: 15 - 41 U/L 280 (H) 144 (H) 93 (H)  ALT Latest Ref Range: 0 - 44 U/L 304 (H) 219 (H) 176 (H)   - CT abd/pelvis in ED reveals hepatic steatosis which may be causing this  in addition to  hepatic congestion from CHF- noted to be improving - recheck in 1 wk  Anemia of chronic disease - follow Hb intermittently    Ref. Range 03/13/2020 16:12  Iron Latest Ref Range: 28 - 170 ug/dL 23 (L)  UIBC Latest Units: ug/dL 165  TIBC Latest Ref Range: 250 - 450 ug/dL 188 (L)  Saturation  Ratios Latest Ref Range: 10.4 - 31.8 % 12  Folate Latest Ref Range: >5.9 ng/mL 16.4  Vitamin B12 Latest Ref Range: 180 - 914 pg/mL 757     Coronary artery disease involving native coronary artery of native heart/ PAD - cont ASA and Plavix     GERD without esophagitis - on Protonix as outpt     Type 2 diabetes mellitus with complication - on Humalog and Glargine as outpt - sugars well controlled in hospital  Thyroid Goiter - cont outpatient follow up- CT chest on 8/30 reveals this goiter again- see below report   Discharge Exam: Vitals:   03/19/20 0935 03/19/20 1127  BP: (!) 125/45 93/61  Pulse: 65 61  Resp:  16  Temp:  98.1 F (36.7 C)  SpO2:  96%   Vitals:   03/19/20 0349 03/19/20 0934 03/19/20 0935 03/19/20 1127  BP: 120/69  (!) 125/45 93/61  Pulse: 65 67 65 61  Resp: 16 16  16   Temp: 98.2 F (36.8 C) 98.1 F (36.7 C)  98.1 F (36.7 C)  TempSrc: Oral Oral    SpO2: 97% 99%  96%  Weight: 77.1 kg     Height:        General: Pt is alert, awake, not in acute distress Cardiovascular: RRR, S1/S2 +, no rubs, no gallops Respiratory: faint crackles at bases, no wheezing, no rhonchi Abdominal: Soft, NT, ND, bowel sounds + Extremities: no edema, no cyanosis   Discharge Instructions  Discharge Instructions    Diet - low sodium heart healthy   Complete by: As directed    Increase activity slowly   Complete by: As directed    No wound care   Complete by: As directed      Allergies as of 03/19/2020      Reactions   Sulfa Antibiotics Nausea And Vomiting   Severe vomiting.       Medication List    STOP taking these medications   amLODipine 10 MG tablet Commonly known as: NORVASC   BASAGLAR KWIKPEN [place] Replaced by: insulin glargine 100 UNIT/ML injection   insulin lispro 100 UNIT/ML injection Commonly known as: HUMALOG   traMADol 50 MG tablet Commonly known as: Ultram     TAKE these medications   acetaminophen 325 MG tablet Commonly known as:  TYLENOL Take 2 tablets (650 mg total) by mouth every 4 (four) hours as needed for headache or mild pain.   aspirin 81 MG EC tablet Take 1 tablet (81 mg total) by mouth daily. Swallow whole. Start taking on: March 20, 2020 What changed:   medication strength  how much to take  when to take this  additional instructions   atorvastatin 20 MG tablet Commonly known as: LIPITOR Take 1 tablet (20 mg total) by mouth daily. Please keep upcoming appt in July with Dr. Irish Lack before anymore refills. Thank you   carvedilol 25 MG tablet Commonly known as: COREG Take 1 tablet (25 mg total) by mouth 2 (two) times daily with a meal. *Please call and schedule an appointment with Dr Fletcher Anon*   cholecalciferol 1000 units tablet Commonly known as:  VITAMIN D Take 2,000 Units by mouth daily.   clopidogrel 75 MG tablet Commonly known as: PLAVIX TAKE 1 TABLET BY MOUTH ONCE DAILY   Co Q 10 100 MG Caps Take 1 capsule by mouth daily.   doxycycline 100 MG tablet Commonly known as: ADOXA Take 1 tablet (100 mg total) by mouth 2 (two) times daily for 14 days.   ergocalciferol 1.25 MG (50000 UT) capsule Commonly known as: VITAMIN D2 Take 50,000 Units by mouth once a week.   FREESTYLE LITE test strip Generic drug: glucose blood USE TO CHECK BS TWICE DAILY  AND PRN   furosemide 40 MG tablet Commonly known as: LASIX Take 1 tablet (40 mg total) by mouth daily. Start taking on: March 20, 2020   gabapentin 300 MG capsule Commonly known as: NEURONTIN Take 1 capsule (300 mg total) by mouth 2 (two) times daily.   insulin aspart 100 UNIT/ML injection Commonly known as: novoLOG Inject 10 Units into the skin 3 (three) times daily with meals.   insulin glargine 100 UNIT/ML injection Commonly known as: LANTUS Inject 0.35 mLs (35 Units total) into the skin every evening. Replaces: BASAGLAR KWIKPEN [place]   lisinopril 20 MG tablet Commonly known as: ZESTRIL Take 20 mg by mouth every evening.    nitroGLYCERIN 0.4 MG SL tablet Commonly known as: NITROSTAT PLACE 1 TABLET UNDER THE TONGUE EVERY 5 MINUTES AS NEEDED FOR CHEST PAIN   pantoprazole 40 MG tablet Commonly known as: PROTONIX TAKE 1 TABLET BY MOUTH ONCE DAILY AT  6AM. Please keep upcoming appt in July with Dr. Irish Lack before anymore refills. Thank you What changed:   how much to take  how to take this  when to take this  reasons to take this   PARoxetine 20 MG tablet Commonly known as: PAXIL Take 20 mg by mouth daily.   polyethylene glycol 17 g packet Commonly known as: MIRALAX / GLYCOLAX Take 17 g by mouth daily as needed for mild constipation.   potassium chloride SA 20 MEQ tablet Commonly known as: KLOR-CON Take 1 tablet (20 mEq total) by mouth daily. Start taking on: March 20, 2020       Allergies  Allergen Reactions  . Sulfa Antibiotics Nausea And Vomiting    Severe vomiting.       DG Chest 2 View  Result Date: 03/19/2020 CLINICAL DATA:  Shortness of breath and hypoxia. Acute cardiogenic pulmonary edema. EXAM: CHEST - 2 VIEW COMPARISON:  03/14/2020 FINDINGS: Heart size is normal. Opacity along the right paratracheal stripe corresponds to known sub sternal goiter which extends into the right paratracheal region. Lung volumes are low. No pleural effusion or edema identified. No airspace densities. IMPRESSION: 1. Low lung volumes.  No acute abnormality noted. 2. Known sub sternal goiter extends into the right paratracheal region. Electronically Signed   By: Kerby Moors M.D.   On: 03/19/2020 12:00   DG Chest 2 View  Result Date: 03/14/2020 CLINICAL DATA:  Shortness of breath. EXAM: CHEST - 2 VIEW COMPARISON:  Chest radiograph and CT chest dated 03/12/2020. FINDINGS: The heart remains enlarged. Vascular calcifications are seen in the aortic arch. Small bilateral pleural effusions with associated bibasilar atelectasis/airspace disease appear unchanged on the right and decreased on the left. There is  no pneumothorax. The patient's known thyroid goiter overlies the right upper lung. Degenerative changes are seen in the spine. IMPRESSION: Small bilateral pleural effusions with associated bibasilar atelectasis/airspace disease, unchanged on the right and decreased on the left. Electronically Signed  By: Zerita Boers M.D.   On: 03/14/2020 17:06   DG Chest 2 View  Result Date: 03/12/2020 CLINICAL DATA:  Short of breath EXAM: CHEST - 2 VIEW COMPARISON:  CT chest 12/14/2016 FINDINGS: Pulmonary vascular congestion with increased interstitial markings compatible with edema. Small bilateral effusions and bibasilar atelectasis Right paratracheal soft tissue mass corresponding to substernal goiter as noted on CT. IMPRESSION: Congestive heart failure with mild interstitial edema and bilateral pleural effusions. Bibasilar atelectasis Substernal goiter in the right paratracheal region. Electronically Signed   By: Franchot Gallo M.D.   On: 03/12/2020 15:16   CT CHEST WO CONTRAST  Result Date: 03/15/2020 CLINICAL DATA:  Respiratory failure. EXAM: CT CHEST WITHOUT CONTRAST TECHNIQUE: Multidetector CT imaging of the chest was performed following the standard protocol without IV contrast. COMPARISON:  03/12/2020. FINDINGS: Cardiovascular: Atherosclerotic calcification of the aorta, aortic valve and coronary arteries. Heart is enlarged. No pericardial effusion. Mediastinum/Nodes: Multiple thyroid nodules are seen, measuring up to 4.4 x 4.5 cm off the posterior right thyroid, similar. No pathologically enlarged mediastinal or axillary lymph nodes. Hilar regions are difficult to definitively evaluate without IV contrast. Esophagus is grossly unremarkable. Lungs/Pleura: Small bilateral pleural effusions with collapse/consolidation in both lower lobes. Dependent ground-glass is seen as well. 3 mm nodule in the lingula, along the left major fissure (5/79) is unchanged from 12/14/2016 and considered benign. Lungs are otherwise  clear. No pleural fluid. Airway is unremarkable. Upper Abdomen: Visualized portions of the liver, adrenal glands, kidneys, spleen and pancreas are unremarkable. Sizable posterior gastric diverticulum. Cholecystectomy. Musculoskeletal: Degenerative changes in the spine. No worrisome lytic or sclerotic lesions. IMPRESSION: 1. Small bilateral pleural effusions with bilateral lower lobe collapse/consolidation which may be due to atelectasis. Difficult to exclude pneumonia. 2. Bilateral thyroid nodules. Recommend thyroid US (ref: J Am Coll Radiol. 2015 Feb;12(2): 143-50). 3. Aortic atherosclerosis (ICD10-I70.0). Coronary artery calcification. Electronically Signed   By: Lorin Picket M.D.   On: 03/15/2020 11:08   CT Angio Chest PE W and/or Wo Contrast  Result Date: 03/12/2020 CLINICAL DATA:  Shortness of breath transmetatarsal amputation yesterday. EXAM: CT ANGIOGRAPHY CHEST WITH CONTRAST TECHNIQUE: Multidetector CT imaging of the chest was performed using the standard protocol during bolus administration of intravenous contrast. Multiplanar CT image reconstructions and MIPs were obtained to evaluate the vascular anatomy. Performed in conjunction with CT of the abdomen/pelvis, reported separately. CONTRAST:  170mL OMNIPAQUE IOHEXOL 350 MG/ML SOLN COMPARISON:  Radiograph earlier today. FINDINGS: Cardiovascular: There are no filling defects within the pulmonary arteries to suggest pulmonary embolus. Aortic atherosclerosis without aortic aneurysm. Mild multi chamber cardiomegaly. Coronary artery calcifications. No pericardial effusion. Mediastinum/Nodes: Thyroid goiter with substernal extension on the right causing mild leftward mass effect on the trachea and esophagus. This is stable from prior exams. This has been evaluated on previous imaging. (ref: J Am Coll Radiol. 2015 Feb;12(2): 143-50).Prior thyroid ultrasound 09/13/2017 multiple small mediastinal lymph nodes are not enlarged by size criteria. No esophageal  wall thickening. Lungs/Pleura: Moderate bilateral pleural effusions with compressive atelectasis. Mild smooth septal thickening consistent with pulmonary edema. No pulmonary mass or suspicious nodule. Upper Abdomen: Assessed on concurrent abdominal CT, reported separately. Musculoskeletal: Scoliosis and multilevel degenerative change in the spine. There are no acute or suspicious osseous abnormalities. Review of the MIP images confirms the above findings. IMPRESSION: 1. No pulmonary embolus. 2. CHF. Moderate bilateral pleural effusions with compressive atelectasis. Mild pulmonary edema. 3. Thyroid goiter with substernal extension on the right causing mild leftward mass effect on the trachea and  esophagus. This is stable from prior exams. This has been evaluated on previous imaging. (ref: J Am Coll Radiol. 2015 Feb;12(2): 143-50). Aortic Atherosclerosis (ICD10-I70.0). Electronically Signed   By: Keith Rake M.D.   On: 03/12/2020 21:47   CT ABDOMEN PELVIS W CONTRAST  Result Date: 03/12/2020 CLINICAL DATA:  Abdominal pain.  Elevated LFTs. EXAM: CT ABDOMEN AND PELVIS WITH CONTRAST TECHNIQUE: Multidetector CT imaging of the abdomen and pelvis was performed using the standard protocol following bolus administration of intravenous contrast. Performed in conjunction with CT of the chest, reported separately. CONTRAST:  113mL OMNIPAQUE IOHEXOL 350 MG/ML SOLN COMPARISON:  Included portion from chest CT 11/26/2016. FINDINGS: Lower chest: Assessed on concurrent chest CTA. Moderate pleural effusions and compressive atelectasis. Hepatobiliary: No focal liver abnormality is seen. Diffusely decreased hepatic density consistent with steatosis. Status post cholecystectomy. No biliary dilatation. Pancreas: Parenchymal atrophy. No ductal dilatation or inflammation. Minimal calcifications in the pancreatic head. Spleen: Normal in size without focal abnormality. Adrenals/Urinary Tract: Normal adrenal glands. No hydronephrosis.  There are lobulated renal contours, left greater than right. Homogeneous renal enhancement with symmetric excretion on delayed phase imaging. Subcentimeter low-density in the lower right kidney is too small to characterize but likely small cyst. Urinary bladder is physiologically distended without wall thickening. Stomach/Bowel: There is a posterior gastric diverticulum that is unchanged from prior chest CT 12/14/2016. This contains intraluminal fluid. No adjacent fat stranding. Stomach is otherwise decompressed. Small duodenal diverticulum arising from the fourth portion. No inflammatory change. Small bowel is unremarkable without obstruction, inflammation, or obvious wall thickening. Lipomatous hypertrophy of the ileocecal valve versus small cecal lipoma, nonobstructing. Appendix not well visualized. No appendicitis. Multifocal and diffuse diverticulosis including the right colon where there is mild fat stranding adjacent to a diverticulum in the ascending colon, series 5, image 47/48. No other colonic inflammation. No colonic wall thickening. Vascular/Lymphatic: Aortic atherosclerosis. No aortic aneurysm. Patent portal vein. Left mesenteric calcification consistent with calcified lymph nodes. No enlarged lymph nodes in the abdomen or pelvis. Reproductive: Status post hysterectomy. No adnexal masses. Other: Trace free fluid in the pelvis. No upper abdominal ascites. No free air. No intra-abdominal abscess. Tiny fat containing umbilical hernia. Musculoskeletal: Diffuse degenerative change in the spine with scoliosis. Mild L5 compression fracture that appears chronic. Degenerative change of the hips and pubic symphysis. No focal bone lesion. IMPRESSION: 1. Mild acute uncomplicated diverticulitis of the ascending colon. 2. Advanced multifocal colonic diverticular disease throughout the entire colon. 3. Hepatic steatosis. 4. Posterior gastric diverticulum, unchanged from prior chest CT 12/14/2016. This contains  intraluminal fluid. No adjacent fat stranding. 5. Additional chronic findings as described. Aortic Atherosclerosis (ICD10-I70.0). Electronically Signed   By: Keith Rake M.D.   On: 03/12/2020 21:55   ECHOCARDIOGRAM COMPLETE  Result Date: 03/13/2020    ECHOCARDIOGRAM REPORT   Patient Name:   PAMLA PANGLE Date of Exam: 03/13/2020 Medical Rec #:  [id number]    Height:       68.0 in Accession #:    [id number]   Weight:       176.4 lb Date of Birth:  02/28/1932   BSA:          1.937 m Patient Age:    42 years     BP:           143/56 mmHg Patient Gender: F            HR:           74 bpm. Exam Location:  Inpatient  Procedure: 2D Echo, Cardiac Doppler and Color Doppler Indications:    I50.30* Unspecified diastolic (congestive) heart failure  History:        Patient has no prior history of Echocardiogram examinations.                 CHF, CAD, Mitral Valve Disease; Risk Factors:Hypertension,                 Dyslipidemia and Diabetes. Pulmonary edema.  Sonographer:    Roseanna Rainbow RDCS Referring Phys: [id number] [place]  Sonographer Comments: Technically difficult study due to poor echo windows and suboptimal parasternal window. IMPRESSIONS  1. Left ventricular ejection fraction, by estimation, is 45 to 50%. The left ventricle has mildly decreased function. The left ventricle demonstrates global hypokinesis. There is mild concentric left ventricular hypertrophy. Left ventricular diastolic function could not be evaluated.  2. Right ventricular systolic function is normal. The right ventricular size is normal. Tricuspid regurgitation signal is inadequate for assessing PA pressure.  3. MVA by VTI 1.50 cm2. The mitral valve is degenerative. Moderate mitral valve regurgitation. Moderate mitral stenosis. The mean mitral valve gradient is 4.0 mmHg with average heart rate of 79 bpm.  4. The aortic valve is tricuspid. Aortic valve regurgitation is not visualized. Mild to moderate aortic valve sclerosis/calcification is  present, without any evidence of aortic stenosis.  5. The inferior vena cava is dilated in size with >50% respiratory variability, suggesting right atrial pressure of 8 mmHg. Comparison(s): Prior images unable to be directly viewed, comparison made by report only. No significant change from prior study. 2014 EF 40-45% with inferior HK. Current study has EF 45-50% with global HK. FINDINGS  Left Ventricle: Left ventricular ejection fraction, by estimation, is 45 to 50%. The left ventricle has mildly decreased function. The left ventricle demonstrates global hypokinesis. The left ventricular internal cavity size was normal in size. There is  mild concentric left ventricular hypertrophy. Abnormal (paradoxical) septal motion, consistent with left bundle branch block. Left ventricular diastolic function could not be evaluated due to mitral annular calcification (moderate or greater). Left ventricular diastolic function could not be evaluated. Right Ventricle: The right ventricular size is normal. No increase in right ventricular wall thickness. Right ventricular systolic function is normal. Tricuspid regurgitation signal is inadequate for assessing PA pressure. Left Atrium: Left atrial size was normal in size. Right Atrium: Right atrial size was normal in size. Pericardium: Trivial pericardial effusion is present. Presence of pericardial fat pad. Mitral Valve: MVA by VTI 1.50 cm2. The mitral valve is degenerative in appearance. There is moderate calcification of the anterior and posterior mitral valve leaflet(s). Moderate to severe mitral annular calcification. Moderate mitral valve regurgitation. Moderate mitral valve stenosis. MV peak gradient, 13.7 mmHg. The mean mitral valve gradient is 4.0 mmHg with average heart rate of 79 bpm. Tricuspid Valve: The tricuspid valve is grossly normal. Tricuspid valve regurgitation is not demonstrated. No evidence of tricuspid stenosis. Aortic Valve: The aortic valve is tricuspid.  Aortic valve regurgitation is not visualized. Mild to moderate aortic valve sclerosis/calcification is present, without any evidence of aortic stenosis. There is moderate calcification of the aortic valve. Aortic valve mean gradient measures 4.4 mmHg. Aortic valve peak gradient measures 9.0 mmHg. Aortic valve area, by VTI measures 2.20 cm. Pulmonic Valve: The pulmonic valve was grossly normal. Pulmonic valve regurgitation is not visualized. No evidence of pulmonic stenosis. Aorta: The aortic root and ascending aorta are structurally normal, with no evidence of dilitation. Venous: The inferior  vena cava is dilated in size with greater than 50% respiratory variability, suggesting right atrial pressure of 8 mmHg. IAS/Shunts: The atrial septum is grossly normal.  LEFT VENTRICLE PLAX 2D LVIDd:         5.10 cm      Diastology LVIDs:         3.60 cm      LV e' lateral:   6.74 cm/s LV PW:         1.80 cm      LV E/e' lateral: 26.2 LV IVS:        1.40 cm      LV e' medial:    3.81 cm/s LVOT diam:     2.00 cm      LV E/e' medial:  46.5 LV SV:         72 LV SV Index:   37 LVOT Area:     3.14 cm  LV Volumes (MOD) LV vol d, MOD A2C: 108.0 ml LV vol d, MOD A4C: 84.5 ml LV vol s, MOD A2C: 62.2 ml LV vol s, MOD A4C: 47.3 ml LV SV MOD A2C:     45.8 ml LV SV MOD A4C:     84.5 ml LV SV MOD BP:      41.2 ml RIGHT VENTRICLE            IVC RV S prime:     7.72 cm/s  IVC diam: 2.30 cm TAPSE (M-mode): 1.8 cm LEFT ATRIUM             Index       RIGHT ATRIUM          Index LA diam:        3.80 cm 1.96 cm/m  RA Area:     9.50 cm LA Vol (A2C):   58.9 ml 30.40 ml/m RA Volume:   18.90 ml 9.76 ml/m LA Vol (A4C):   46.5 ml 24.00 ml/m LA Biplane Vol: 57.9 ml 29.89 ml/m  AORTIC VALVE AV Area (Vmax):    2.15 cm AV Area (Vmean):   1.94 cm AV Area (VTI):     2.20 cm AV Vmax:           150.40 cm/s AV Vmean:          98.664 cm/s AV VTI:            0.327 m AV Peak Grad:      9.0 mmHg AV Mean Grad:      4.4 mmHg LVOT Vmax:         103.00 cm/s  LVOT Vmean:        61.000 cm/s LVOT VTI:          0.229 m LVOT/AV VTI ratio: 0.70  AORTA Ao Root diam: 3.50 cm Ao Asc diam:  3.00 cm MITRAL VALVE MV Area (PHT): 3.89 cm      SHUNTS MV Area VTI:   1.50 cm      Systemic VTI:  0.23 m MV Peak grad:  13.7 mmHg     Systemic Diam: 2.00 cm MV Mean grad:  4.0 mmHg MV Vmax:       1.85 m/s MV Vmean:      94.2 cm/s MV VTI:        0.48 m MV Decel Time: 195 msec MR Peak grad:    126.3 mmHg MR Mean grad:    82.0 mmHg MR Vmax:         562.00 cm/s MR Vmean:  425.0 cm/s MR PISA:         0.25 cm MR PISA Eff ROA: 2 mm MR PISA Radius:  0.20 cm MV E velocity: 177.00 cm/s MV A velocity: 154.00 cm/s MV E/A ratio:  1.15 Eleonore Chiquito MD Electronically signed by Eleonore Chiquito MD Signature Date/Time: 03/13/2020/12:37:58 PM    Final    US THYROID  Result Date: 03/17/2020 CLINICAL DATA:  86 year old with thyroid nodules. Prior biopsy to right inferior thyroid nodule and left mid thyroid nodule on 10/07/2015. EXAM: THYROID ULTRASOUND TECHNIQUE: Ultrasound examination of the thyroid gland and adjacent soft tissues was performed. COMPARISON:  09/13/2017 and 10/01/2015 FINDINGS: Parenchymal Echotexture: Markedly heterogenous Isthmus: 1.0 cm, previously 0.5 cm Right lobe: 5.0 x 2.2 x 2.8 cm, previously 8.1 x 5.1 x 2.9 cm Left lobe: 6.1 x 2.8 x 2.7 cm, previously 5.6 x 2.5 x 3.0 cm _________________________________________________________ Estimated total number of nodules >/= 1 cm: 5 Number of spongiform nodules >/=  2 cm not described below (TR1): 0 Number of mixed cystic and solid nodules >/= 1.5 cm not described below (TR2): 0 _________________________________________________________ Nodule 1 in the right mid thyroid lobe is isoechoic with some peripheral calcifications. This nodule measures 2.8 x 1.5 x 2.3 cm and measured 2.0 x 1.4 x 2.3 cm in 2017. This is compatible with a TR 4 nodule. Difficult to accurately compare these nodules due to heterogeneity throughout the thyroid tissue.  Nodule 2 is hypoechoic heterogeneous nodule with calcifications in the right inferior thyroid lobe. This appears to represent the previously biopsied nodule. Nodule 2 measures 2.4 x 2.2 x 1.8 cm measured at least 1.9 cm in 2019. Nodule 3 in the left superior thyroid lobe is primarily isoechoic and measures 1.2 x 0.7 x 1.1 cm and previously measured up to 1.3 cm. Nodule 4 is compatible with the previously biopsied nodule in the left mid thyroid lobe. This nodule is heterogeneous with calcifications and measures 2.7 x 2.3 x 2.4 cm and reportedly previously measured 2.6 x 2.5 x 2.1 cm. Nodule 5 in the left inferior thyroid lobe is isoechoic and measures 2.3 x 2.0 x 2.1 cm, reportedly previously measured 2.1 x 2.1 x 1.7 cm. IMPRESSION: 1. Multinodular goiter. 2. Thyroid tissue is severely heterogeneous and difficult to accurately compare nodules between examinations. Morphology of the previously biopsied nodules have not significantly changed. 3. Nodule 1 in the right mid thyroid lobe may represent a TR 4 nodule but difficult to know if this represents a conglomeration of nodules. If this is a single nodule, it would be compatible with a TR 4 nodule and meet criteria for biopsy. However, this nodule has minimally enlarged since 2017 and favor a benign etiology. Favor surveillance rather than biopsy. 4. Nodule 5 in the left inferior thyroid lobe is compatible with a TR 3 nodule and meets criteria for follow-up. 5. If additional surveillance is clinically warranted, consider another follow-up in 1-2 years. The above is in keeping with the ACR TI-RADS recommendations - J Am Coll Radiol 2017;14:587-595. Electronically Signed   By: Markus Daft M.D.   On: 03/17/2020 11:37     The results of significant diagnostics from this hospitalization (including imaging, microbiology, ancillary and laboratory) are listed below for reference.     Microbiology: Recent Results (from the past 240 hour(s))  SARS Coronavirus 2 by RT PCR  (hospital order, performed in [facility] hospital lab) Nasopharyngeal Nasopharyngeal Swab     Status: None   Collection Time: 03/12/20 10:21 PM   Specimen:  Nasopharyngeal Swab  Result Value Ref Range Status   SARS Coronavirus 2 NEGATIVE NEGATIVE Final    Comment: (NOTE) SARS-CoV-2 target nucleic acids are NOT DETECTED.  The SARS-CoV-2 RNA is generally detectable in upper and lower respiratory specimens during the acute phase of infection. The lowest concentration of SARS-CoV-2 viral copies this assay can detect is 250 copies / mL. A negative result does not preclude SARS-CoV-2 infection and should not be used as the sole basis for treatment or other patient management decisions.  A negative result may occur with improper specimen collection / handling, submission of specimen other than nasopharyngeal swab, presence of viral mutation(s) within the areas targeted by this assay, and inadequate number of viral copies (<250 copies / mL). A negative result must be combined with clinical observations, patient history, and epidemiological information.  Fact Sheet for Patients:   StrictlyIdeas.no  Fact Sheet for Healthcare Providers: BankingDealers.co.za  This test is not yet approved or  cleared by the [place] FDA and has been authorized for detection and/or diagnosis of SARS-CoV-2 by FDA under an Emergency Use Authorization (EUA).  This EUA will remain in effect (meaning this test can be used) for the duration of the COVID-19 declaration under Section 564(b)(1) of the Act, 21 U.S.C. section 360bbb-3(b)(1), unless the authorization is terminated or revoked sooner.  Performed at [facility], [place] [address], [place], [place] [address]   Culture, blood (routine x 2)     Status: None   Collection Time: 03/13/20  8:10 AM   Specimen: BLOOD  Result Value Ref Range Status   Specimen Description BLOOD RIGHT ANTECUBITAL  Final    Special Requests   Final    BOTTLES DRAWN AEROBIC AND ANAEROBIC Blood Culture adequate volume   Culture   Final    NO GROWTH 5 DAYS Performed at [facility], [place] [address], [place], [place] [address]    Report Status 03/18/2020 FINAL  Final  Culture, blood (routine x 2)     Status: None   Collection Time: 03/13/20  8:18 AM   Specimen: BLOOD RIGHT HAND  Result Value Ref Range Status   Specimen Description BLOOD RIGHT HAND  Final   Special Requests   Final    BOTTLES DRAWN AEROBIC AND ANAEROBIC Blood Culture adequate volume   Culture   Final    NO GROWTH 5 DAYS Performed at [facility], [place] [address] [place], [place] [address]    Report Status 03/18/2020 FINAL  Final  SARS Coronavirus 2 by RT PCR (hospital order, performed in [facility] hospital lab) Nasopharyngeal Nasopharyngeal Swab     Status: None   Collection Time: 03/17/20  5:39 PM   Specimen: Nasopharyngeal Swab  Result Value Ref Range Status   SARS Coronavirus 2 NEGATIVE NEGATIVE Final    Comment: (NOTE) SARS-CoV-2 target nucleic acids are NOT DETECTED.  The SARS-CoV-2 RNA is generally detectable in upper and lower respiratory specimens during the acute phase of infection. The lowest concentration of SARS-CoV-2 viral copies this assay can detect is 250 copies / mL. A negative result does not preclude SARS-CoV-2 infection and should not be used as the sole basis for treatment or other patient management decisions.  A negative result may occur with improper specimen collection / handling, submission of specimen other than nasopharyngeal swab, presence of viral mutation(s) within the areas targeted by this assay, and inadequate number of viral copies (<250 copies / mL). A negative result must be combined with  clinical observations, patient history, and epidemiological information.  Fact Sheet for Patients:   StrictlyIdeas.no  Fact Sheet for Healthcare  Providers: BankingDealers.co.za  This test is not yet approved or  cleared by the [place] FDA and has been authorized for detection and/or diagnosis of SARS-CoV-2 by FDA under an Emergency Use Authorization (EUA).  This EUA will remain in effect (meaning this test can be used) for the duration of the COVID-19 declaration under Section 564(b)(1) of the Act, 21 U.S.C. section 360bbb-3(b)(1), unless the authorization is terminated or revoked sooner.  Performed at [facility], [place] [address] [place], [place] [address]      Labs: BNP (last 3 results) Recent Labs    03/12/20 2013  BNP 2,409.7*   Basic Metabolic Panel: Recent Labs  Lab 03/13/20 0747 03/13/20 0747 03/14/20 0452 03/15/20 0501 03/16/20 0736 03/17/20 0741 03/18/20 0459  NA 141   < > 140 141 141 139 138  K 4.0   < > 3.8 3.7 3.8 3.9 4.0  CL 101   < > 101 100 100 97* 100  CO2 27   < > 29 29 32 30 29  GLUCOSE 176*   < > 150* 181* 99 111* 113*  BUN 22   < > 27* 19 14 16 18   CREATININE 0.66   < > 0.67 0.66 0.69 0.64 0.84  CALCIUM 9.1   < > 8.5* 8.4* 8.7* 8.9 8.6*  MG 1.7  --  1.7  --   --   --   --    < > = values in this interval not displayed.   Liver Function Tests: Recent Labs  Lab 03/12/20 1414 03/13/20 0747 03/14/20 0452 03/15/20 0501  AST 892* 280* 144* 93*  ALT 428* 304* 219* 176*  ALKPHOS 127* 143* 93 98  BILITOT 0.5 0.5 0.5 0.4  PROT 6.7 6.9 5.7* 5.8*  ALBUMIN 2.5* 2.5* 2.1* 2.0*   Recent Labs  Lab 03/12/20 2013  LIPASE 19   No results for input(s): AMMONIA in the last 168 hours. CBC: Recent Labs  Lab 03/12/20 1414 03/13/20 0747 03/14/20 0452 03/15/20 0501 03/18/20 0459  WBC 17.5* 15.4* 12.2* 13.4* 13.5*  NEUTROABS 14.7* 12.5*  --   --   --   HGB 8.2* 8.8* 7.8* 8.4* 8.2*  HCT 27.2* 28.6* 25.9* 27.3* 27.8*  MCV 87.5 87.5 86.6 87.2 87.4  PLT 569* 589* 505* 537* 540*   Cardiac Enzymes: No results for input(s): CKTOTAL, CKMB, CKMBINDEX, TROPONINI  in the last 168 hours. BNP: Invalid input(s): POCBNP CBG: Recent Labs  Lab 03/18/20 1236 03/18/20 1610 03/18/20 2111 03/19/20 0556 03/19/20 1127  GLUCAP 176* 75 124* 121* 136*   D-Dimer No results for input(s): DDIMER in the last 72 hours. Hgb A1c No results for input(s): HGBA1C in the last 72 hours. Lipid Profile No results for input(s): CHOL, HDL, LDLCALC, TRIG, CHOLHDL, LDLDIRECT in the last 72 hours. Thyroid function studies No results for input(s): TSH, T4TOTAL, T3FREE, THYROIDAB in the last 72 hours.  Invalid input(s): FREET3 Anemia work up No results for input(s): VITAMINB12, FOLATE, FERRITIN, TIBC, IRON, RETICCTPCT in the last 72 hours. Urinalysis No results found for: COLORURINE, APPEARANCEUR, LABSPEC, [place], GLUCOSEU, [place], [place], KETONESUR, PROTEINUR, UROBILINOGEN, NITRITE, LEUKOCYTESUR Sepsis Labs Invalid input(s): PROCALCITONIN,  WBC,  LACTICIDVEN Microbiology Recent Results (from the past 240 hour(s))  SARS Coronavirus 2 by RT PCR (hospital order, performed in [facility] hospital lab) Nasopharyngeal Nasopharyngeal Swab     Status: None  Collection Time: 03/12/20 10:21 PM   Specimen: Nasopharyngeal Swab  Result Value Ref Range Status   SARS Coronavirus 2 NEGATIVE NEGATIVE Final    Comment: (NOTE) SARS-CoV-2 target nucleic acids are NOT DETECTED.  The SARS-CoV-2 RNA is generally detectable in upper and lower respiratory specimens during the acute phase of infection. The lowest concentration of SARS-CoV-2 viral copies this assay can detect is 250 copies / mL. A negative result does not preclude SARS-CoV-2 infection and should not be used as the sole basis for treatment or other patient management decisions.  A negative result may occur with improper specimen collection / handling, submission of specimen other than nasopharyngeal swab, presence of viral mutation(s) within the areas targeted by this assay, and inadequate number of viral copies (<250  copies / mL). A negative result must be combined with clinical observations, patient history, and epidemiological information.  Fact Sheet for Patients:   StrictlyIdeas.no  Fact Sheet for Healthcare Providers: BankingDealers.co.za  This test is not yet approved or  cleared by the [place] FDA and has been authorized for detection and/or diagnosis of SARS-CoV-2 by FDA under an Emergency Use Authorization (EUA).  This EUA will remain in effect (meaning this test can be used) for the duration of the COVID-19 declaration under Section 564(b)(1) of the Act, 21 U.S.C. section 360bbb-3(b)(1), unless the authorization is terminated or revoked sooner.  Performed at [facility], [place] [address] [place], [place] [address]   Culture, blood (routine x 2)     Status: None   Collection Time: 03/13/20  8:10 AM   Specimen: BLOOD  Result Value Ref Range Status   Specimen Description BLOOD RIGHT ANTECUBITAL  Final   Special Requests   Final    BOTTLES DRAWN AEROBIC AND ANAEROBIC Blood Culture adequate volume   Culture   Final    NO GROWTH 5 DAYS Performed at [facility], [place] [address] [place], [place] [address]    Report Status 03/18/2020 FINAL  Final  Culture, blood (routine x 2)     Status: None   Collection Time: 03/13/20  8:18 AM   Specimen: BLOOD RIGHT HAND  Result Value Ref Range Status   Specimen Description BLOOD RIGHT HAND  Final   Special Requests   Final    BOTTLES DRAWN AEROBIC AND ANAEROBIC Blood Culture adequate volume   Culture   Final    NO GROWTH 5 DAYS Performed at [facility], [place] [address], [place], [place] [address]    Report Status 03/18/2020 FINAL  Final  SARS Coronavirus 2 by RT PCR (hospital order, performed in [facility] hospital lab) Nasopharyngeal Nasopharyngeal Swab     Status: None   Collection Time: 03/17/20  5:39 PM   Specimen: Nasopharyngeal Swab  Result Value Ref Range Status    SARS Coronavirus 2 NEGATIVE NEGATIVE Final    Comment: (NOTE) SARS-CoV-2 target nucleic acids are NOT DETECTED.  The SARS-CoV-2 RNA is generally detectable in upper and lower respiratory specimens during the acute phase of infection. The lowest concentration of SARS-CoV-2 viral copies this assay can detect is 250 copies / mL. A negative result does not preclude SARS-CoV-2 infection and should not be used as the sole basis for treatment or other patient management decisions.  A negative result may occur with improper specimen collection / handling, submission of specimen other than nasopharyngeal swab, presence of viral mutation(s) within the areas targeted by this assay, and inadequate number of viral copies (<250 copies /  mL). A negative result must be combined with clinical observations, patient history, and epidemiological information.  Fact Sheet for Patients:   StrictlyIdeas.no  Fact Sheet for Healthcare Providers: BankingDealers.co.za  This test is not yet approved or  cleared by the [place] FDA and has been authorized for detection and/or diagnosis of SARS-CoV-2 by FDA under an Emergency Use Authorization (EUA).  This EUA will remain in effect (meaning this test can be used) for the duration of the COVID-19 declaration under Section 564(b)(1) of the Act, 21 U.S.C. section 360bbb-3(b)(1), unless the authorization is terminated or revoked sooner.  Performed at [facility], [place] [address], [place], [place] [address]      Time coordinating discharge in minutes: 65  SIGNED:   Debbe Odea, MD  [facility] 03/19/2020, 12:48 PM

## 2020-03-19 NOTE — Progress Notes (Signed)
  Mobility Specialist Criteria Algorithm Info.  SATURATION QUALIFICATIONS: (This note is used to comply with regulatory documentation for home oxygen)  Patient Saturations on Room Air at Rest = 99%  Patient Saturations on Room Air while Ambulating = 87%  Patient Saturations on 2 Liters of oxygen while Ambulating = 100%  Please briefly explain why patient needs home oxygen: Pt oxygen desaturated on room air, requiring 2L O2 to maintain an oxygen saturation >95%  Mobility Team:  HOB elevated:HOB 30 Activity: Ambulated in hall;Dangled on edge of bed Range of motion: Active;All extremities Level of assistance: Standby assist, set-up cues, supervision of patient - no hands on Assistive device: Front wheel walker Minutes sitting in chair:  Minutes stood: 8 minutes Minutes ambulated: 6 minutes Distance ambulated (ft): 120 ft Mobility response: Tolerated fair;RN notified (BP down 95/51 sitting; 85/50 standing) Bed Position: Semi-fowlers  Pt willing and eager to participate in mobility this morning. Ambulated in hallway without incident or need for supplemental oxygen for approximately 62ft. Oxygen did desaturate to 87% on RA while ambulating, c/o feeling "woozy". Oxygen quickly recovers with standing rest break. Although pt recovered without the need of supplemental oxygen pt was put on 2L O2 to maintain an oxygen saturation >95%. While administering oxygen, pt had a LOB x2 requiring maximal assist to steady and prevent fall. Although she had intermittent lightheadedness or dizziness, declined the need for seated rest and was able to ambulate back to her room. Once pt returned she was still c/o feeling woozy even with sitting at the EOB, BP was checked and found to be hypotensive; 95/51 while sitting and 85/50 while standing. Pt now seated at the EOB without any complaints and call bell within reach.   03/19/2020 10:35 AM

## 2020-03-23 ENCOUNTER — Other Ambulatory Visit: Payer: Self-pay

## 2020-03-23 NOTE — Patient Outreach (Signed)
  [facility]) Care Management Chronic Special Needs Program    03/23/2020  Name: [name], DOB: 1932-06-08  MRN: [id number]   [name] is enrolled in a chronic special needs plan. RNCM received notification from V. Doug Sou, RNCM/Hospital Liaison, that client has transitioned to skilled facility, [facility] care.   Plan: RNCM will continue to follow. Follow up post discharge from skilled facility.  Thea Silversmith, RN, MSN, [place] [phone]

## 2020-03-24 DIAGNOSIS — Z4781 Encounter for orthopedic aftercare following surgical amputation: Secondary | ICD-10-CM | POA: Diagnosis not present

## 2020-03-24 DIAGNOSIS — I5032 Chronic diastolic (congestive) heart failure: Secondary | ICD-10-CM | POA: Diagnosis not present

## 2020-03-24 DIAGNOSIS — E1165 Type 2 diabetes mellitus with hyperglycemia: Secondary | ICD-10-CM | POA: Diagnosis not present

## 2020-03-24 DIAGNOSIS — E11621 Type 2 diabetes mellitus with foot ulcer: Secondary | ICD-10-CM | POA: Diagnosis not present

## 2020-03-25 DIAGNOSIS — I10 Essential (primary) hypertension: Secondary | ICD-10-CM | POA: Diagnosis not present

## 2020-03-25 DIAGNOSIS — E1122 Type 2 diabetes mellitus with diabetic chronic kidney disease: Secondary | ICD-10-CM | POA: Diagnosis not present

## 2020-03-25 DIAGNOSIS — I5043 Acute on chronic combined systolic (congestive) and diastolic (congestive) heart failure: Secondary | ICD-10-CM | POA: Diagnosis not present

## 2020-03-25 DIAGNOSIS — E11621 Type 2 diabetes mellitus with foot ulcer: Secondary | ICD-10-CM | POA: Diagnosis not present

## 2020-03-26 DIAGNOSIS — J189 Pneumonia, unspecified organism: Secondary | ICD-10-CM | POA: Diagnosis not present

## 2020-03-26 DIAGNOSIS — I5032 Chronic diastolic (congestive) heart failure: Secondary | ICD-10-CM | POA: Diagnosis not present

## 2020-03-26 DIAGNOSIS — I10 Essential (primary) hypertension: Secondary | ICD-10-CM | POA: Diagnosis not present

## 2020-03-26 DIAGNOSIS — I25111 Atherosclerotic heart disease of native coronary artery with angina pectoris with documented spasm: Secondary | ICD-10-CM | POA: Diagnosis not present

## 2020-03-29 ENCOUNTER — Other Ambulatory Visit: Payer: Self-pay | Admitting: Interventional Cardiology

## 2020-03-31 ENCOUNTER — Ambulatory Visit: Payer: Self-pay

## 2020-04-01 DIAGNOSIS — I5032 Chronic diastolic (congestive) heart failure: Secondary | ICD-10-CM | POA: Diagnosis not present

## 2020-04-01 DIAGNOSIS — Z9181 History of falling: Secondary | ICD-10-CM | POA: Diagnosis not present

## 2020-04-01 DIAGNOSIS — Z7902 Long term (current) use of antithrombotics/antiplatelets: Secondary | ICD-10-CM | POA: Diagnosis not present

## 2020-04-01 DIAGNOSIS — D649 Anemia, unspecified: Secondary | ICD-10-CM | POA: Diagnosis not present

## 2020-04-01 DIAGNOSIS — Z4781 Encounter for orthopedic aftercare following surgical amputation: Secondary | ICD-10-CM | POA: Diagnosis not present

## 2020-04-01 DIAGNOSIS — E782 Mixed hyperlipidemia: Secondary | ICD-10-CM | POA: Diagnosis not present

## 2020-04-01 DIAGNOSIS — E039 Hypothyroidism, unspecified: Secondary | ICD-10-CM | POA: Diagnosis not present

## 2020-04-01 DIAGNOSIS — J9691 Respiratory failure, unspecified with hypoxia: Secondary | ICD-10-CM | POA: Diagnosis not present

## 2020-04-01 DIAGNOSIS — I2510 Atherosclerotic heart disease of native coronary artery without angina pectoris: Secondary | ICD-10-CM | POA: Diagnosis not present

## 2020-04-01 DIAGNOSIS — E1151 Type 2 diabetes mellitus with diabetic peripheral angiopathy without gangrene: Secondary | ICD-10-CM | POA: Diagnosis not present

## 2020-04-01 DIAGNOSIS — Z8701 Personal history of pneumonia (recurrent): Secondary | ICD-10-CM | POA: Diagnosis not present

## 2020-04-01 DIAGNOSIS — K219 Gastro-esophageal reflux disease without esophagitis: Secondary | ICD-10-CM | POA: Diagnosis not present

## 2020-04-01 DIAGNOSIS — E049 Nontoxic goiter, unspecified: Secondary | ICD-10-CM | POA: Diagnosis not present

## 2020-04-01 DIAGNOSIS — I110 Hypertensive heart disease with heart failure: Secondary | ICD-10-CM | POA: Diagnosis not present

## 2020-04-01 DIAGNOSIS — Z794 Long term (current) use of insulin: Secondary | ICD-10-CM | POA: Diagnosis not present

## 2020-04-02 ENCOUNTER — Other Ambulatory Visit: Payer: Self-pay

## 2020-04-02 DIAGNOSIS — I779 Disorder of arteries and arterioles, unspecified: Secondary | ICD-10-CM

## 2020-04-02 NOTE — Patient Outreach (Signed)
[facility]) Care Management Chronic Special Needs Program  04/02/2020  Name: [name] DOB: 05-11-1932  MRN: [id number]  Ms. Louis Gaw is enrolled in a chronic special needs plan for Diabetes. Client transitioned to [facility] skilled facility for rehabiliation. RNCM unable to discuss with client. Care plan will be updated based on available data.:  Goals Addressed            This Visit's Progress   . Client will report no worsening of symptoms related to heart disease within the next 6 months   On track    Notify provider for symptoms of chest pain, sweating, nausea/vomiting, irregular heart beat, palpitations, rapid heart rate, shortness of breath, dizziness or fainting. Call 911 for sever symptoms of chest pain or shortness of breath, dizziness or fainting. Take medication as prescribed. Increase exercise only if you are able to do it. Follow doctor recommendations. Follow a low salt meal plan, limit or avoid drinks with alcohol. Follow up with your Cardiologist (heart doctor) for yearly visits.    . Client will verbalize knowledge of self management of Hypertension as evidences by BP reading of 140/90 or less; or as defined by provider   On track    Blood pressure 03/09/20 136/60  Continue blood pressure self management strategies: Follow up with your doctor as scheduled. Take your medications as prescribed by your doctor. Ask your doctor "what is my target blood pressure range". Monitor your blood pressure and take results to your doctor's appointment.  Monitor the amount of salt you are eating. Continue to exercise as tolerated and remain active. Eat heart healthy diet (full of fruits, vegetables, whole grains, lean protein, water--limit salt, fat, and sugar/simple carb intake).     . Decrease inpatient admissions/ readmissions with in the next year       Admitted 8/27-03/19/20 and discharged to skilled facility for rehabilitation. Continue to  follow recommended therapies.     . Obtain annual  Lipid Profile, LDL-C   On track    No information available. It is important to follow up with your provider as scheduled for recommended labs. Try to avoid saturated fats, trans-fats and eat more fiber.     . COMPLETED: Obtain Annual Eye (retinal)  Exam        Completed 07/31/2019    . Obtain Annual Foot Exam   On track    It is important to follow up with your provider as scheduled for annual exam.    . Obtain annual screen for micro albuminuria (urine) , nephropathy (kidney problems)   On track    No updated information available, per available record last done 05/31/2018. It is important to follow up with your provider as scheduled for recommended labs. This test looks at how your kidneys are working.    . Obtain Hemoglobin A1C at least 2 times per year   On track    A1C 8.3 on 03/13/20.    Diabetes self management actions:  Glucose monitoring per provider recommendations  Eat Healthy  Check feet daily  Visit provider every 3-6 months as directed  Hbg A1C level every 3-6 months.  Eye Exam yearly    . Visit Primary Care Provider or Endocrinologist at least 2 times per year    On track    It is important to follow up with your provider as scheduled for recommended labs/procedures/prescription refills. Continue to attend provider visits as recommended.       Plan: send updated care  plan to client, send updated care plan to primary care provider. RNCM will continue to follow and outreach will be made to client post discharged from skilled rehab.    Thea Silversmith, RN, MSN, [place] [phone]

## 2020-04-05 ENCOUNTER — Other Ambulatory Visit: Payer: Self-pay

## 2020-04-05 ENCOUNTER — Ambulatory Visit: Payer: HMO | Admitting: Surgery

## 2020-04-05 ENCOUNTER — Ambulatory Visit (HOSPITAL_COMMUNITY)
Admission: RE | Admit: 2020-04-05 | Discharge: 2020-04-05 | Disposition: A | Discharge status: Home / Self Care | Payer: HMO | Source: Ambulatory Visit | Attending: Surgery | Admitting: Surgery

## 2020-04-05 ENCOUNTER — Encounter: Payer: Self-pay | Admitting: Surgery

## 2020-04-05 VITALS — BP 131/85 | HR 100 | Temp 97.7°F | Resp 20 | Ht 68.0 in | Wt 169.0 lb

## 2020-04-05 DIAGNOSIS — L97401 Non-pressure chronic ulcer of unspecified heel and midfoot limited to breakdown of skin: Secondary | ICD-10-CM | POA: Diagnosis not present

## 2020-04-05 DIAGNOSIS — E08621 Diabetes mellitus due to underlying condition with foot ulcer: Secondary | ICD-10-CM | POA: Diagnosis not present

## 2020-04-05 DIAGNOSIS — I779 Disorder of arteries and arterioles, unspecified: Secondary | ICD-10-CM

## 2020-04-05 NOTE — H&P (View-Only) (Signed)
[facility] [place]  Patient name: [name] MRN: [id number] DOB: [birth year] Sex: female   REASON FOR VISIT:    Follow up  HISOTRY OF PRESENT ILLNESS:    [name] is a 86 y.o. female who is status post atherectomy and angioplasty of an 80% ostial left peroneal artery stenosis and drug-eluting balloon angioplasty of  tandem 60-70% left superficial femoral artery lesions.  This was performed on 04/16/2018 for a wound which has been treated by both Dr. Sharol Given and podiatry.  This wound healed.  She developed a new wound and a repeat angiogram was performed on 09-03-2018.  I stented her left SFA and repeated PTA of her peroneal. She is status post left 3d ray amputation (12-03-2019) and right TMA on 03-11-2020 by Dr. Doran Durand for diabetic ulcers.  She is having difficulty healing the right side.  The left side is intact and healed.    The Patient suffers from coronary artery disease.   She has undergone PCI in the past. She is a diabetic. She is on statin for hypercholesterolemia and takes an ACE inhibitor for hypertension.  PAST MEDICAL HISTORY:   Past Medical History:  Diagnosis Date  . Anxiety   . CAD (coronary artery disease)   . Cellulitis 10/2015   LEFT FOOT  . CHF (congestive heart failure) ([place])   . Complication of anesthesia   . Coronary artery disease   . Diabetes mellitus without complication (HCC)    insulin dependent  . GERD (gastroesophageal reflux disease)   . Hypertension   . Hypothyroidism   . Neuromuscular disorder (HCC)    muscle cramps to lower extremities  . Other primary cardiomyopathies   . Peripheral vascular disease ([place])   . PONV (postoperative nausea and vomiting)   . Shortness of breath      FAMILY HISTORY:   Family History  Problem Relation Age of Onset  . Heart disease Father   . Heart attack Father   . Diabetes Sister   . Heart disease Son        before age 85  . Heart attack  60        86yr old  . Sudden death Grandchild   . Hypertension Neg Hx     SOCIAL HISTORY:   Social History   Tobacco Use  . Smoking status: Former Smoker    Quit date: 11/28/1960    Years since quitting: 59.3  . Smokeless tobacco: Never Used  Substance Use Topics  . Alcohol use: Yes    Alcohol/week: 0.0 standard drinks    Comment: rare     ALLERGIES:   Allergies  Allergen Reactions  . Sulfa Antibiotics Nausea And Vomiting    Severe vomiting.      CURRENT MEDICATIONS:   Current Outpatient Medications  Medication Sig Dispense Refill  . acetaminophen (TYLENOL) 325 MG tablet Take 2 tablets (650 mg total) by mouth every 4 (four) hours as needed for headache or mild pain.    Marland Kitchen aspirin EC 81 MG EC tablet Take 1 tablet (81 mg total) by mouth daily. Swallow whole. 30 tablet 11  . atorvastatin (LIPITOR) 20 MG tablet Take 1 tablet (20 mg total) by mouth daily. Please keep upcoming appt in July with Dr. Irish Lack before anymore refills. Thank you 90 tablet 0  . carvedilol (COREG) 25 MG tablet Take 1 tablet (25 mg total) by mouth 2 (two) times daily with a meal. *Please call and schedule an appointment with Dr  Arida* 60 tablet 0  . cholecalciferol (VITAMIN D) 1000 units tablet Take 2,000 Units by mouth daily.     . clopidogrel (PLAVIX) 75 MG tablet TAKE 1 TABLET BY MOUTH ONCE DAILY 30 tablet 11  . Coenzyme Q10 (CO Q 10) 100 MG CAPS Take 1 capsule by mouth daily.     . ergocalciferol (VITAMIN D2) 1.25 MG (50000 UT) capsule Take 50,000 Units by mouth once a week.    Marland Kitchen FREESTYLE LITE test strip USE TO CHECK BS TWICE DAILY  AND PRN    . furosemide (LASIX) 40 MG tablet Take 1 tablet (40 mg total) by mouth daily. 30 tablet   . gabapentin (NEURONTIN) 300 MG capsule Take 1 capsule (300 mg total) by mouth 2 (two) times daily.    . insulin aspart (NOVOLOG) 100 UNIT/ML injection Inject 10 Units into the skin 3 (three) times daily with meals. 10 mL 11  . insulin glargine (LANTUS) 100  UNIT/ML injection Inject 0.35 mLs (35 Units total) into the skin every evening. 10 mL 11  . lisinopril (PRINIVIL,ZESTRIL) 20 MG tablet Take 20 mg by mouth every evening.     . nitroGLYCERIN (NITROSTAT) 0.4 MG SL tablet PLACE 1 TABLET UNDER THE TONGUE EVERY 5 MINUTES AS NEEDED FOR CHEST PAIN 25 tablet 4  . pantoprazole (PROTONIX) 40 MG tablet TAKE 1 TABLET BY MOUTH ONCE DAILY AT 6 AM 90 tablet 3  . PARoxetine (PAXIL) 20 MG tablet Take 20 mg by mouth daily.     . polyethylene glycol (MIRALAX / GLYCOLAX) 17 g packet Take 17 g by mouth daily as needed for mild constipation. 14 each 0  . potassium chloride SA (KLOR-CON) 20 MEQ tablet Take 1 tablet (20 mEq total) by mouth daily.     No current facility-administered medications for this visit.    REVIEW OF SYSTEMS:   [X]  denotes positive finding, [ ]  denotes negative finding Cardiac  Comments:  Chest pain or chest pressure:    Shortness of breath upon exertion:    Short of breath when lying flat:    Irregular heart rhythm:        Vascular    Pain in calf, thigh, or hip brought on by ambulation:    Pain in feet at night that wakes you up from your sleep:     Blood clot in your veins:    Leg swelling:         Pulmonary    Oxygen at home:    Productive cough:     Wheezing:         Neurologic    Sudden weakness in arms or legs:     Sudden numbness in arms or legs:     Sudden onset of difficulty speaking or slurred speech:    Temporary loss of vision in one eye:     Problems with dizziness:         Gastrointestinal    Blood in stool:     Vomited blood:         Genitourinary    Burning when urinating:     Blood in urine:        Psychiatric    Major depression:         Hematologic    Bleeding problems:    Problems with blood clotting too easily:        Skin    Rashes or ulcers:        Constitutional    Fever or chills:  PHYSICAL EXAM:   There were no vitals filed for this visit.  GENERAL: The patient is a  well-nourished female, in no acute distress. The vital signs are documented above. CARDIAC: There is a regular rate and rhythm.  VASCULAR: Nonpalpable pedal pulses PULMONARY: Non-labored respirations MUSCULOSKELETAL: There are no major deformities or cyanosis. NEUROLOGIC: No focal weakness or paresthesias are detected. SKIN: See photo below PSYCHIATRIC: The patient has a normal affect.    STUDIES:   I have reviewed the following: +-------+-----------+-----------+------------+------------+  ABI/TBIToday's ABIToday's TBIPrevious ABIPrevious TBI  +-------+-----------+-----------+------------+------------+  Right 0.60    amputation 0.60    0.34      +-------+-----------+-----------+------------+------------+  Left  0.95    0.77    0.95    0.77      +-------+-----------+-----------+------------+------------+  Bilateral ABIs appear essentially unchanged compared to prior study on  10/27/2019.   Left: Patent stent with no visualized stenosis. 50 - 74% stenosis in the  mid SFA area.   MEDICAL ISSUES:   Nonhealing right transmetatarsal amputation: The patient's ABI today was 0.6.  I have never evaluated her right leg with angiography however she has had a duplex a few years ago that showed a superficial femoral artery stenosis.  This clearly represents a limb threatening situation.  I think we need to proceed with angiography to evaluate her blood flow and hopefully improve it.  I have her on the schedule for this Thursday, September 23.  This will be a left femoral cannulation with aortogram and right leg runoff with intervention as indicated.  The risks and benefits of the procedure were discussed with the patient and her son.  All questions were answered.  She will continue her aspirin and Plavix and statin    [name], IV, MD, FACS [facility] [phone] Pager [phone]

## 2020-04-05 NOTE — Progress Notes (Signed)
[facility] [place]  Patient name: [name] MRN: [id number] DOB: July 03, 1932 Sex: female   REASON FOR VISIT:    Follow up  HISOTRY OF PRESENT ILLNESS:    [name] is a 86 y.o. female who is status post atherectomy and angioplasty of an 80% ostial left peroneal artery stenosis and drug-eluting balloon angioplasty of  tandem 60-70% left superficial femoral artery lesions.  This was performed on 04/16/2018 for a wound which has been treated by both Dr. Sharol Given and podiatry.  This wound healed.  She developed a new wound and a repeat angiogram was performed on 09-03-2018.  I stented her left SFA and repeated PTA of her peroneal. She is status post left 3d ray amputation (12-03-2019) and right TMA on 03-11-2020 by Dr. Doran Durand for diabetic ulcers.  She is having difficulty healing the right side.  The left side is intact and healed.    The Patient suffers from coronary artery disease.   She has undergone PCI in the past. She is a diabetic. She is on statin for hypercholesterolemia and takes an ACE inhibitor for hypertension.  PAST MEDICAL HISTORY:   Past Medical History:  Diagnosis Date  . Anxiety   . CAD (coronary artery disease)   . Cellulitis 10/2015   LEFT FOOT  . CHF (congestive heart failure) ([place])   . Complication of anesthesia   . Coronary artery disease   . Diabetes mellitus without complication (HCC)    insulin dependent  . GERD (gastroesophageal reflux disease)   . Hypertension   . Hypothyroidism   . Neuromuscular disorder (HCC)    muscle cramps to lower extremities  . Other primary cardiomyopathies   . Peripheral vascular disease ([place])   . PONV (postoperative nausea and vomiting)   . Shortness of breath      FAMILY HISTORY:   Family History  Problem Relation Age of Onset  . Heart disease Father   . Heart attack Father   . Diabetes Sister   . Heart disease Son        before age 54  . Heart attack  12        84yr old  . Sudden death Grandchild   . Hypertension Neg Hx     SOCIAL HISTORY:   Social History   Tobacco Use  . Smoking status: Former Smoker    Quit date: 11/28/1960    Years since quitting: 59.3  . Smokeless tobacco: Never Used  Substance Use Topics  . Alcohol use: Yes    Alcohol/week: 0.0 standard drinks    Comment: rare     ALLERGIES:   Allergies  Allergen Reactions  . Sulfa Antibiotics Nausea And Vomiting    Severe vomiting.      CURRENT MEDICATIONS:   Current Outpatient Medications  Medication Sig Dispense Refill  . acetaminophen (TYLENOL) 325 MG tablet Take 2 tablets (650 mg total) by mouth every 4 (four) hours as needed for headache or mild pain.    Marland Kitchen aspirin EC 81 MG EC tablet Take 1 tablet (81 mg total) by mouth daily. Swallow whole. 30 tablet 11  . atorvastatin (LIPITOR) 20 MG tablet Take 1 tablet (20 mg total) by mouth daily. Please keep upcoming appt in July with Dr. Irish Lack before anymore refills. Thank you 90 tablet 0  . carvedilol (COREG) 25 MG tablet Take 1 tablet (25 mg total) by mouth 2 (two) times daily with a meal. *Please call and schedule an appointment with Dr  Arida* 60 tablet 0  . cholecalciferol (VITAMIN D) 1000 units tablet Take 2,000 Units by mouth daily.     . clopidogrel (PLAVIX) 75 MG tablet TAKE 1 TABLET BY MOUTH ONCE DAILY 30 tablet 11  . Coenzyme Q10 (CO Q 10) 100 MG CAPS Take 1 capsule by mouth daily.     . ergocalciferol (VITAMIN D2) 1.25 MG (50000 UT) capsule Take 50,000 Units by mouth once a week.    Marland Kitchen FREESTYLE LITE test strip USE TO CHECK BS TWICE DAILY  AND PRN    . furosemide (LASIX) 40 MG tablet Take 1 tablet (40 mg total) by mouth daily. 30 tablet   . gabapentin (NEURONTIN) 300 MG capsule Take 1 capsule (300 mg total) by mouth 2 (two) times daily.    . insulin aspart (NOVOLOG) 100 UNIT/ML injection Inject 10 Units into the skin 3 (three) times daily with meals. 10 mL 11  . insulin glargine (LANTUS) 100  UNIT/ML injection Inject 0.35 mLs (35 Units total) into the skin every evening. 10 mL 11  . lisinopril (PRINIVIL,ZESTRIL) 20 MG tablet Take 20 mg by mouth every evening.     . nitroGLYCERIN (NITROSTAT) 0.4 MG SL tablet PLACE 1 TABLET UNDER THE TONGUE EVERY 5 MINUTES AS NEEDED FOR CHEST PAIN 25 tablet 4  . pantoprazole (PROTONIX) 40 MG tablet TAKE 1 TABLET BY MOUTH ONCE DAILY AT 6 AM 90 tablet 3  . PARoxetine (PAXIL) 20 MG tablet Take 20 mg by mouth daily.     . polyethylene glycol (MIRALAX / GLYCOLAX) 17 g packet Take 17 g by mouth daily as needed for mild constipation. 14 each 0  . potassium chloride SA (KLOR-CON) 20 MEQ tablet Take 1 tablet (20 mEq total) by mouth daily.     No current facility-administered medications for this visit.    REVIEW OF SYSTEMS:   [X]  denotes positive finding, [ ]  denotes negative finding Cardiac  Comments:  Chest pain or chest pressure:    Shortness of breath upon exertion:    Short of breath when lying flat:    Irregular heart rhythm:        Vascular    Pain in calf, thigh, or hip brought on by ambulation:    Pain in feet at night that wakes you up from your sleep:     Blood clot in your veins:    Leg swelling:         Pulmonary    Oxygen at home:    Productive cough:     Wheezing:         Neurologic    Sudden weakness in arms or legs:     Sudden numbness in arms or legs:     Sudden onset of difficulty speaking or slurred speech:    Temporary loss of vision in one eye:     Problems with dizziness:         Gastrointestinal    Blood in stool:     Vomited blood:         Genitourinary    Burning when urinating:     Blood in urine:        Psychiatric    Major depression:         Hematologic    Bleeding problems:    Problems with blood clotting too easily:        Skin    Rashes or ulcers:        Constitutional    Fever or chills:  PHYSICAL EXAM:   There were no vitals filed for this visit.  GENERAL: The patient is a  well-nourished female, in no acute distress. The vital signs are documented above. CARDIAC: There is a regular rate and rhythm.  VASCULAR: Nonpalpable pedal pulses PULMONARY: Non-labored respirations MUSCULOSKELETAL: There are no major deformities or cyanosis. NEUROLOGIC: No focal weakness or paresthesias are detected. SKIN: See photo below PSYCHIATRIC: The patient has a normal affect.    STUDIES:   I have reviewed the following: +-------+-----------+-----------+------------+------------+  ABI/TBIToday's ABIToday's TBIPrevious ABIPrevious TBI  +-------+-----------+-----------+------------+------------+  Right 0.60    amputation 0.60    0.34      +-------+-----------+-----------+------------+------------+  Left  0.95    0.77    0.95    0.77      +-------+-----------+-----------+------------+------------+  Bilateral ABIs appear essentially unchanged compared to prior study on  10/27/2019.   Left: Patent stent with no visualized stenosis. 50 - 74% stenosis in the  mid SFA area.   MEDICAL ISSUES:   Nonhealing right transmetatarsal amputation: The patient's ABI today was 0.6.  I have never evaluated her right leg with angiography however she has had a duplex a few years ago that showed a superficial femoral artery stenosis.  This clearly represents a limb threatening situation.  I think we need to proceed with angiography to evaluate her blood flow and hopefully improve it.  I have her on the schedule for this Thursday, September 23.  This will be a left femoral cannulation with aortogram and right leg runoff with intervention as indicated.  The risks and benefits of the procedure were discussed with the patient and her son.  All questions were answered.  She will continue her aspirin and Plavix and statin    [name], IV, MD, FACS [facility] [phone] Pager [phone]

## 2020-04-06 ENCOUNTER — Other Ambulatory Visit: Payer: Self-pay

## 2020-04-06 DIAGNOSIS — D649 Anemia, unspecified: Secondary | ICD-10-CM | POA: Diagnosis not present

## 2020-04-06 DIAGNOSIS — I10 Essential (primary) hypertension: Secondary | ICD-10-CM | POA: Diagnosis not present

## 2020-04-06 DIAGNOSIS — E1159 Type 2 diabetes mellitus with other circulatory complications: Secondary | ICD-10-CM | POA: Diagnosis not present

## 2020-04-06 DIAGNOSIS — I739 Peripheral vascular disease, unspecified: Secondary | ICD-10-CM | POA: Diagnosis not present

## 2020-04-06 DIAGNOSIS — S98131A Complete traumatic amputation of one right lesser toe, initial encounter: Secondary | ICD-10-CM | POA: Diagnosis not present

## 2020-04-06 DIAGNOSIS — I5022 Chronic systolic (congestive) heart failure: Secondary | ICD-10-CM | POA: Diagnosis not present

## 2020-04-06 NOTE — Patient Outreach (Signed)
  [facility]) Care Management Chronic Special Needs Program  04/06/2020  Name: [name] DOB: 1931/11/23  MRN: [id number]  [name] is enrolled in a chronic special needs plan for Diabetes. RNCM received notification from care management assistant that client is admitted to home health services on 04/02/20. Client is active with [facility] for complex community care management and is currently following client for transition of care. RNCM updated care plan.    Goals Addressed            This Visit's Progress   . Decrease inpatient admissions/ readmissions with in the next year   On track    Please participate with Home health therapy as recommended. Please follow discharge instructions and call provider if you have any questions. Please attend all follow up appointments as scheduled. Please take your medications as prescribed. Please continue to follow up with [facility] as scheduled and call them as needed.      . General - Client will not be readmitted within 30 days (C-SNP) discharged 04/02/2020   On track    Please follow discharge instructions and call provider if you have any questions. Please attend all follow up appointments as scheduled. Please take your medications as prescribed. Please continue to call [facility] [phone] as needed.        Plan: Send updated care plan to client; send updated care plan to primary care provider. RNCM will continue to care coordinate with [facility] as indicated. RNCM will continue to follow.   Thea Silversmith, RN, MSN, [place] [phone]

## 2020-04-07 ENCOUNTER — Other Ambulatory Visit (HOSPITAL_COMMUNITY)
Admission: RE | Admit: 2020-04-07 | Discharge: 2020-04-07 | Disposition: A | Discharge status: Home / Self Care | Payer: HMO | Source: Ambulatory Visit | Attending: Surgery | Admitting: Surgery

## 2020-04-07 ENCOUNTER — Other Ambulatory Visit: Payer: Self-pay

## 2020-04-07 DIAGNOSIS — Z01812 Encounter for preprocedural laboratory examination: Secondary | ICD-10-CM | POA: Insufficient documentation

## 2020-04-07 DIAGNOSIS — Z20822 Contact with and (suspected) exposure to covid-19: Secondary | ICD-10-CM | POA: Insufficient documentation

## 2020-04-07 LAB — SARS CORONAVIRUS 2 (TAT 6-24 HRS): SARS Coronavirus 2: NEGATIVE

## 2020-04-07 NOTE — Patient Outreach (Signed)
  [facility] Care Management Chronic Special Needs Program   04/07/2020  Name: [name], [name]: 10-29-1931  MRN: [id number]  The client was discussed in today's interdisciplinary care team meeting.  The following issues were discussed:  Client's needs, Changes in health status, Care Plan, Coordination of care and Care transitions  Participants present:   Recommendations:  none  Plan: Continue care coordination/collaboration with [facility] as indicated. RNCM will continue to follow.   Thea Silversmith, RN, MSN, [place] [phone]

## 2020-04-08 ENCOUNTER — Ambulatory Visit (HOSPITAL_COMMUNITY)
Admission: RE | Admit: 2020-04-08 | Discharge: 2020-04-08 | Disposition: A | Discharge status: Home / Self Care | Payer: HMO | Attending: Surgery | Admitting: Surgery

## 2020-04-08 ENCOUNTER — Other Ambulatory Visit: Payer: Self-pay

## 2020-04-08 ENCOUNTER — Ambulatory Visit (HOSPITAL_COMMUNITY)
Admission: RE | Disposition: A | Discharge status: Home / Self Care | Payer: Self-pay | Source: Home / Self Care | Attending: Surgery

## 2020-04-08 DIAGNOSIS — E039 Hypothyroidism, unspecified: Secondary | ICD-10-CM | POA: Diagnosis not present

## 2020-04-08 DIAGNOSIS — Z833 Family history of diabetes mellitus: Secondary | ICD-10-CM | POA: Diagnosis not present

## 2020-04-08 DIAGNOSIS — I2510 Atherosclerotic heart disease of native coronary artery without angina pectoris: Secondary | ICD-10-CM | POA: Insufficient documentation

## 2020-04-08 DIAGNOSIS — Z794 Long term (current) use of insulin: Secondary | ICD-10-CM | POA: Diagnosis not present

## 2020-04-08 DIAGNOSIS — Z7982 Long term (current) use of aspirin: Secondary | ICD-10-CM | POA: Diagnosis not present

## 2020-04-08 DIAGNOSIS — I509 Heart failure, unspecified: Secondary | ICD-10-CM | POA: Diagnosis not present

## 2020-04-08 DIAGNOSIS — E11621 Type 2 diabetes mellitus with foot ulcer: Secondary | ICD-10-CM | POA: Diagnosis not present

## 2020-04-08 DIAGNOSIS — Z87891 Personal history of nicotine dependence: Secondary | ICD-10-CM | POA: Insufficient documentation

## 2020-04-08 DIAGNOSIS — Z882 Allergy status to sulfonamides status: Secondary | ICD-10-CM | POA: Diagnosis not present

## 2020-04-08 DIAGNOSIS — T8789 Other complications of amputation stump: Secondary | ICD-10-CM | POA: Diagnosis not present

## 2020-04-08 DIAGNOSIS — I70235 Atherosclerosis of native arteries of right leg with ulceration of other part of foot: Secondary | ICD-10-CM | POA: Diagnosis not present

## 2020-04-08 DIAGNOSIS — Z7902 Long term (current) use of antithrombotics/antiplatelets: Secondary | ICD-10-CM | POA: Diagnosis not present

## 2020-04-08 DIAGNOSIS — K219 Gastro-esophageal reflux disease without esophagitis: Secondary | ICD-10-CM | POA: Insufficient documentation

## 2020-04-08 DIAGNOSIS — E1151 Type 2 diabetes mellitus with diabetic peripheral angiopathy without gangrene: Secondary | ICD-10-CM | POA: Diagnosis not present

## 2020-04-08 DIAGNOSIS — I428 Other cardiomyopathies: Secondary | ICD-10-CM | POA: Insufficient documentation

## 2020-04-08 DIAGNOSIS — Z8249 Family history of ischemic heart disease and other diseases of the circulatory system: Secondary | ICD-10-CM | POA: Diagnosis not present

## 2020-04-08 DIAGNOSIS — I110 Hypertensive heart disease with heart failure: Secondary | ICD-10-CM | POA: Insufficient documentation

## 2020-04-08 DIAGNOSIS — L97519 Non-pressure chronic ulcer of other part of right foot with unspecified severity: Secondary | ICD-10-CM | POA: Insufficient documentation

## 2020-04-08 DIAGNOSIS — Z79899 Other long term (current) drug therapy: Secondary | ICD-10-CM | POA: Diagnosis not present

## 2020-04-08 HISTORY — PX: PERIPHERAL VASCULAR ATHERECTOMY: CATH118256

## 2020-04-08 HISTORY — PX: PERIPHERAL VASCULAR BALLOON ANGIOPLASTY: CATH118281

## 2020-04-08 HISTORY — PX: ABDOMINAL AORTOGRAM W/LOWER EXTREMITY: CATH118223

## 2020-04-08 LAB — POCT I-STAT, CHEM 8
BUN: 30 mg/dL — ABNORMAL HIGH (ref 8–23)
Calcium, Ion: 1.13 mmol/L — ABNORMAL LOW (ref 1.15–1.40)
Chloride: 102 mmol/L (ref 98–111)
Creatinine, Ser: 0.6 mg/dL (ref 0.44–1.00)
Glucose, Bld: 239 mg/dL — ABNORMAL HIGH (ref 70–99)
HCT: 30 % — ABNORMAL LOW (ref 36.0–46.0)
Hemoglobin: 10.2 g/dL — ABNORMAL LOW (ref 12.0–15.0)
Potassium: 4.9 mmol/L (ref 3.5–5.1)
Sodium: 140 mmol/L (ref 135–145)
TCO2: 26 mmol/L (ref 22–32)

## 2020-04-08 LAB — POCT ACTIVATED CLOTTING TIME
Activated Clotting Time: 197 seconds
Activated Clotting Time: 213 seconds
Activated Clotting Time: 180 seconds

## 2020-04-08 LAB — GLUCOSE, CAPILLARY: Glucose-Capillary: 177 mg/dL — ABNORMAL HIGH (ref 70–99)

## 2020-04-08 SURGERY — ABDOMINAL AORTOGRAM W/LOWER EXTREMITY
Anesthesia: LOCAL | Laterality: Right

## 2020-04-08 MED ORDER — ACETAMINOPHEN 325 MG PO TABS: 650.0000 mg | ORAL_TABLET | ORAL | Status: DC | PRN

## 2020-04-08 MED ORDER — NITROGLYCERIN IN D5W 200-5 MCG/ML-% IV SOLN
INTRAVENOUS | Status: AC
  Filled 2020-04-08: qty 250

## 2020-04-08 MED ORDER — HEPARIN SODIUM (PORCINE) 1000 UNIT/ML IJ SOLN
INTRAMUSCULAR | Status: AC
  Filled 2020-04-08: qty 1

## 2020-04-08 MED ORDER — HEPARIN (PORCINE) IN NACL 1000-0.9 UT/500ML-% IV SOLN
INTRAVENOUS | Status: AC
  Filled 2020-04-08: qty 1000

## 2020-04-08 MED ORDER — LABETALOL HCL 5 MG/ML IV SOLN: 10.0000 mg | INTRAVENOUS | Status: DC | PRN

## 2020-04-08 MED ORDER — VERAPAMIL HCL 2.5 MG/ML IV SOLN
INTRAVENOUS | Status: AC
  Filled 2020-04-08: qty 2

## 2020-04-08 MED ORDER — SODIUM CHLORIDE 0.9 % WEIGHT BASED INFUSION: 1.0000 mL/kg/h | INTRAVENOUS | Status: DC

## 2020-04-08 MED ORDER — SODIUM CHLORIDE 0.9 % IV SOLN: INTRAVENOUS | Status: DC

## 2020-04-08 MED ORDER — HYDRALAZINE HCL 20 MG/ML IJ SOLN: 5.0000 mg | INTRAMUSCULAR | Status: DC | PRN

## 2020-04-08 MED ORDER — HEPARIN (PORCINE) IN NACL 1000-0.9 UT/500ML-% IV SOLN
INTRAVENOUS | Status: DC | PRN
  Administered 2020-04-08 (×2): 500 mL

## 2020-04-08 MED ORDER — MIDAZOLAM HCL 2 MG/2ML IJ SOLN
INTRAMUSCULAR | Status: DC | PRN
  Administered 2020-04-08: 1 mg via INTRAVENOUS

## 2020-04-08 MED ORDER — FENTANYL CITRATE (PF) 100 MCG/2ML IJ SOLN
INTRAMUSCULAR | Status: AC
  Filled 2020-04-08: qty 2

## 2020-04-08 MED ORDER — SODIUM CHLORIDE 0.9% FLUSH: 3.0000 mL | INTRAVENOUS | Status: DC | PRN

## 2020-04-08 MED ORDER — LIDOCAINE HCL (PF) 1 % IJ SOLN
INTRAMUSCULAR | Status: DC | PRN
  Administered 2020-04-08: 15 mL via INTRADERMAL

## 2020-04-08 MED ORDER — FENTANYL CITRATE (PF) 100 MCG/2ML IJ SOLN
INTRAMUSCULAR | Status: DC | PRN
Start: 2020-04-08 — End: 2020-04-08
  Administered 2020-04-08: 50 ug via INTRAVENOUS

## 2020-04-08 MED ORDER — SODIUM CHLORIDE 0.9 % IV SOLN: 250.0000 mL | INTRAVENOUS | Status: DC | PRN

## 2020-04-08 MED ORDER — IODIXANOL 320 MG/ML IV SOLN
INTRAVENOUS | Status: DC | PRN
  Administered 2020-04-08: 100 mL via INTRA_ARTERIAL

## 2020-04-08 MED ORDER — ONDANSETRON HCL 4 MG/2ML IJ SOLN: 4.0000 mg | Freq: Four times a day (QID) | INTRAMUSCULAR | Status: DC | PRN

## 2020-04-08 MED ORDER — HEPARIN SODIUM (PORCINE) 1000 UNIT/ML IJ SOLN
INTRAMUSCULAR | Status: DC | PRN
  Administered 2020-04-08: 8000 [IU] via INTRAVENOUS
  Administered 2020-04-08: 3000 [IU] via INTRAVENOUS

## 2020-04-08 MED ORDER — MIDAZOLAM HCL 2 MG/2ML IJ SOLN
INTRAMUSCULAR | Status: AC
  Filled 2020-04-08: qty 2

## 2020-04-08 MED ORDER — SODIUM CHLORIDE 0.9% FLUSH: 3.0000 mL | Freq: Two times a day (BID) | INTRAVENOUS | Status: DC

## 2020-04-08 MED ORDER — MORPHINE SULFATE (PF) 2 MG/ML IV SOLN: 2.0000 mg | INTRAVENOUS | Status: DC | PRN

## 2020-04-08 MED ORDER — LIDOCAINE HCL (PF) 1 % IJ SOLN
INTRAMUSCULAR | Status: AC
  Filled 2020-04-08: qty 30

## 2020-04-08 SURGICAL SUPPLY — 20 items
BALLN STERLING SL OTW 3X80X150 (BALLOONS) ×3
BALLOON STRLNG SL OTW 3X80X150 (BALLOONS) IMPLANT
CATH OMNI FLUSH 5F 65CM (CATHETERS) ×1 IMPLANT
CROWN STEALTH MICRO-30 1.25MM (CATHETERS) ×1 IMPLANT
DCB RANGER 5.0X100 135 (BALLOONS) IMPLANT
KIT ENCORE 26 ADVANTAGE (KITS) ×1 IMPLANT
KIT MICROPUNCTURE NIT STIFF (SHEATH) ×1 IMPLANT
KIT PV (KITS) ×3 IMPLANT
LUBRICANT VIPERSLIDE CORONARY (MISCELLANEOUS) ×1 IMPLANT
RANGER DCB 5.0X100 135 (BALLOONS) ×3
SHEATH PINNACLE 5F 10CM (SHEATH) ×1 IMPLANT
SHEATH PINNACLE MP 6F 45CM (SHEATH) ×1 IMPLANT
SHEATH PROBE COVER 6X72 (BAG) ×1 IMPLANT
SYR MEDRAD MARK V 150ML (SYRINGE) ×1 IMPLANT
TRANSDUCER W/STOPCOCK (MISCELLANEOUS) ×3 IMPLANT
TRAY PV CATH (CUSTOM PROCEDURE TRAY) ×3 IMPLANT
WIRE G V18X300CM (WIRE) ×1 IMPLANT
WIRE ROSEN-J .035X180CM (WIRE) ×1 IMPLANT
WIRE STARTER BENTSON 035X150 (WIRE) ×1 IMPLANT
WIRE VIPER WIRECTO 0.014 (WIRE) ×2 IMPLANT

## 2020-04-08 NOTE — Op Note (Signed)
Patient name: [name] MRN: [id number] DOB: 30-Jun-1932 Sex: female  04/08/2020 Pre-operative Diagnosis: Nonhealing right transmetatarsal amputation Post-operative diagnosis:  Same Surgeon:  [name] Procedure Performed:  1.  Ultrasound-guided access, left femoral artery  2.  Abdominal aortogram  3.  Right lower extremity angiogram  4.  Orbital atherectomy, right peroneal artery  5.  Orbital atherectomy, right superficial femoral artery  6.  Balloon angioplasty, right peroneal artery  7.  Drug-coated balloon angioplasty, right superficial femoral artery  8.  Conscious sedation, 70 minutes   Indications: The patient has recently undergone a right transmetatarsal amputation which is not healing.  She comes in today for vascular evaluation.  Procedure:  The patient was identified in the holding area and taken to room 8.  The patient was then placed supine on the table and prepped and draped in the usual sterile fashion.  A time out was called.  Conscious sedation was administered with the use of IV fentanyl and Versed under continuous physician and nurse monitoring.  Heart rate, blood pressure, and oxygen saturation were continuously monitored.  Total sedation time was 60minutes.  Ultrasound was used to evaluate the left common femoral artery.  It was patent .  A digital ultrasound image was acquired.  A micropuncture needle was used to access the left common femoral artery under ultrasound guidance.  An 018 wire was advanced without resistance and a micropuncture sheath was placed.  The 018 wire was removed and a benson wire was placed.  The micropuncture sheath was exchanged for a 5 french sheath.  An omniflush catheter was advanced over the wire to the level of L-1.  An abdominal angiogram was obtained.  Next, using the omniflush catheter and a benson wire, the aortic bifurcation was crossed and the catheter was placed into theright external iliac artery and right runoff was obtained.     Findings:   Aortogram: Greater than 50% left renal artery stenosis.  The right renal artery is widely patent.  The infrarenal abdominal aorta is patent without significant stenosis.  Bilateral common and external iliac arteries are widely patent.  Right Lower Extremity: The right common femoral and profundofemoral artery are widely patent.  The superficial femoral artery is patent.  There is a 10 cm segment within the mid superficial femoral artery at the adductor canal that is diffusely diseased, with stenosis approximately 60%.  The below-knee popliteal artery is widely patent.  There is a subtotal occlusion at the origin of the peroneal artery which is the single-vessel runoff.  Left Lower Extremity: Not evaluated  Intervention: After the above images were acquired the decision was made to proceed with intervention.  A 6 French 45 cm sheath was advanced over the bifurcation and placed into the right external iliac artery.  The patient was fully heparinized.  Using the support of a 3 x 100 Sterling balloon and a V-18 wire, the peroneal artery stenosis was successfully crossed.  A Viper wire was then inserted.  A CSI microcatheter was then used to perform orbital atherectomy of the peroneal artery at low medium and high speeds.  A 3 mm Sterling balloon was then used to perform balloon angioplasty of the peroneal artery for 2 minutes at nominal pressure.  Follow-up imaging showed no residual stenosis.  I then reinserted the micro catheter and performed orbital atherectomy of the superficial femoral artery stenosis at high speed.  A 5 x 100 Ranger drug-coated balloon was then used to perform balloon angioplasty for 3  minutes at nominal pressure.  Completion imaging showed resolution of the stenosis.  Catheters and wires were removed.  The sheath was withdrawn to the left external iliac artery.  The patient be taken the holding area for sheath pull once the coagulation profile correct.  Impression:  #1  10  cm segment of the right superficial femoral artery with 60% stenosis successfully treated with orbital atherectomy and a 5 mm drug-coated balloon  #2  Subtotal occlusion of the origin of the right peroneal artery successfully treated with orbital atherectomy using a CSI microcatheter followed by 3 mm balloon angioplasty with no residual stenosis  #3  Left renal artery stenosis    V. [name], M.D., [facility] of [place] Office: [phone] Pager:  [phone]

## 2020-04-08 NOTE — Discharge Instructions (Signed)
Femoral Site Care This sheet gives you information about how to care for yourself after your procedure. Your health care provider may also give you more specific instructions. If you have problems or questions, contact your health care provider. What can I expect after the procedure? After the procedure, it is common to have:  Bruising that usually fades within 1-2 weeks.  Tenderness at the site. Follow these instructions at home: Wound care  Follow instructions from your health care provider about how to take care of your insertion site. Make sure you: ? Wash your hands with soap and water before you change your bandage (dressing). If soap and water are not available, use hand sanitizer. ? Change your dressing as told by your health care provider. ? Leave stitches (sutures), skin glue, or adhesive strips in place. These skin closures may need to stay in place for 2 weeks or longer. If adhesive strip edges start to loosen and curl up, you may trim the loose edges. Do not remove adhesive strips completely unless your health care provider tells you to do that.  Do not take baths, swim, or use a hot tub until your health care provider approves.  You may shower 24-48 hours after the procedure or as told by your health care provider. ? Gently wash the site with plain soap and water. ? Pat the area dry with a clean towel. ? Do not rub the site. This may cause bleeding.  Do not apply powder or lotion to the site. Keep the site clean and dry.  Check your femoral site every day for signs of infection. Check for: ? Redness, swelling, or pain. ? Fluid or blood. ? Warmth. ? Pus or a bad smell. Activity  For the first 2-3 days after your procedure, or as long as directed: ? Avoid climbing stairs as much as possible. ? Do not squat.  Do not lift anything that is heavier than 10 lb (4.5 kg), or the limit that you are told, until your health care provider says that it is safe.  Rest as  directed. ? Avoid sitting for a long time without moving. Get up to take short walks every 1-2 hours.  Do not drive for 24 hours if you were given a medicine to help you relax (sedative). General instructions  Take over-the-counter and prescription medicines only as told by your health care provider.  Keep all follow-up visits as told by your health care provider. This is important. Contact a health care provider if you have:  A fever or chills.  You have redness, swelling, or pain around your insertion site. Get help right away if:  The catheter insertion area swells very fast.  You pass out.  You suddenly start to sweat or your skin gets clammy.  The catheter insertion area is bleeding, and the bleeding does not stop when you hold steady pressure on the area.  The area near or just beyond the catheter insertion site becomes pale, cool, tingly, or numb. These symptoms may represent a serious problem that is an emergency. Do not wait to see if the symptoms will go away. Get medical help right away. Call your local emergency services (911 in the [place] Do not drive yourself to the hospital. Summary  After the procedure, it is common to have bruising that usually fades within 1-2 weeks.  Check your femoral site every day for signs of infection.  Do not lift anything that is heavier than 10 lb (4.5 kg), or the   limit that you are told, until your health care provider says that it is safe. This information is not intended to replace advice given to you by your health care provider. Make sure you discuss any questions you have with your health care provider. Document Revised: 07/16/2017 Document Reviewed: 07/16/2017 Elsevier Patient Education  2020 [employer]  

## 2020-04-08 NOTE — Progress Notes (Signed)
Site area: Left groin a 6 french long arterial sheath was removed by Nelda Severe RTR  Site Prior to Removal:  Level 0  Pressure Applied For 20 MINUTES    Bedrest Beginning at 1425p X 4 hours  Manual:   Yes.    Patient Status During Pull:  stable  Post Pull Groin Site:  Level 0  Post Pull Instructions Given:  Yes.    Post Pull Pulses Present:  Yes.    Dressing Applied:  Yes.    Comments:

## 2020-04-08 NOTE — Progress Notes (Signed)
Up and walked and tolerated well; left groin stable, no bleeding or hematoma 

## 2020-04-08 NOTE — Interval H&P Note (Signed)
History and Physical Interval Note:  04/08/2020 10:48 AM  [name]  has presented today for surgery, with the diagnosis of diabetic ulcer right foot.  The various methods of treatment have been discussed with the patient and family. After consideration of risks, benefits and other options for treatment, the patient has consented to  Procedure(s): ABDOMINAL AORTOGRAM W/LOWER EXTREMITY (Bilateral) as a surgical intervention.  The patient's history has been reviewed, patient examined, no change in status, stable for surgery.  I have reviewed the patient's chart and labs.  Questions were answered to the patient's satisfaction.     [name]

## 2020-04-09 ENCOUNTER — Encounter (HOSPITAL_COMMUNITY): Payer: Self-pay | Admitting: Surgery

## 2020-04-09 MED FILL — Nitroglycerin IV Soln 200 MCG/ML in D5W: INTRAVENOUS | Qty: 250 | Status: AC

## 2020-04-09 MED FILL — Verapamil HCl IV Soln 2.5 MG/ML: INTRAVENOUS | Qty: 2 | Status: AC

## 2020-04-13 ENCOUNTER — Other Ambulatory Visit: Payer: Self-pay

## 2020-04-13 NOTE — Patient Outreach (Signed)
  [facility]) Care Management Chronic Special Needs Program    04/13/2020  Name: [name], DOB: [birth year]  MRN: [id number]   [name] is enrolled in a chronic special needs plan for Diabetes. RNCM returned call to client. Client  states she is supposed to have physical therapy and nursing to assist with dressing changes with [facility]. Client reports a nurse has been out to see her as well as a physical therapist, but reports some type of problem with continued care. RNCM called [facility] for clarification. Voice message left. Client is also active with [facility] for complex community care coordination. RNCM notified [facility].  Plan: continue care coordination/collaboration with [facility]; await return call from [facility] and continue to follow.  Thea Silversmith, RN, MSN, [place] [phone]

## 2020-04-14 ENCOUNTER — Encounter (HOSPITAL_BASED_OUTPATIENT_CLINIC_OR_DEPARTMENT_OTHER): Payer: HMO | Attending: Physician Assistant | Admitting: Physician Assistant

## 2020-04-14 ENCOUNTER — Other Ambulatory Visit: Payer: Self-pay

## 2020-04-14 DIAGNOSIS — T8189XA Other complications of procedures, not elsewhere classified, initial encounter: Secondary | ICD-10-CM | POA: Diagnosis not present

## 2020-04-14 DIAGNOSIS — E1140 Type 2 diabetes mellitus with diabetic neuropathy, unspecified: Secondary | ICD-10-CM | POA: Insufficient documentation

## 2020-04-14 DIAGNOSIS — I110 Hypertensive heart disease with heart failure: Secondary | ICD-10-CM | POA: Insufficient documentation

## 2020-04-14 DIAGNOSIS — Z89431 Acquired absence of right foot: Secondary | ICD-10-CM | POA: Diagnosis not present

## 2020-04-14 DIAGNOSIS — Z86718 Personal history of other venous thrombosis and embolism: Secondary | ICD-10-CM | POA: Diagnosis not present

## 2020-04-14 DIAGNOSIS — L97512 Non-pressure chronic ulcer of other part of right foot with fat layer exposed: Secondary | ICD-10-CM | POA: Insufficient documentation

## 2020-04-14 DIAGNOSIS — Z882 Allergy status to sulfonamides status: Secondary | ICD-10-CM | POA: Insufficient documentation

## 2020-04-14 DIAGNOSIS — E11621 Type 2 diabetes mellitus with foot ulcer: Secondary | ICD-10-CM | POA: Insufficient documentation

## 2020-04-14 DIAGNOSIS — T8131XA Disruption of external operation (surgical) wound, not elsewhere classified, initial encounter: Secondary | ICD-10-CM | POA: Diagnosis not present

## 2020-04-14 DIAGNOSIS — R262 Difficulty in walking, not elsewhere classified: Secondary | ICD-10-CM | POA: Diagnosis not present

## 2020-04-14 DIAGNOSIS — S82891S Other fracture of right lower leg, sequela: Secondary | ICD-10-CM | POA: Insufficient documentation

## 2020-04-14 DIAGNOSIS — X58XXXS Exposure to other specified factors, sequela: Secondary | ICD-10-CM | POA: Diagnosis not present

## 2020-04-14 DIAGNOSIS — L97519 Non-pressure chronic ulcer of other part of right foot with unspecified severity: Secondary | ICD-10-CM | POA: Diagnosis present

## 2020-04-14 DIAGNOSIS — I509 Heart failure, unspecified: Secondary | ICD-10-CM | POA: Diagnosis not present

## 2020-04-14 DIAGNOSIS — E1151 Type 2 diabetes mellitus with diabetic peripheral angiopathy without gangrene: Secondary | ICD-10-CM | POA: Diagnosis not present

## 2020-04-14 NOTE — Progress Notes (Signed)
[name], [name] ([id number]) Visit Report for 04/14/2020 Abuse/Suicide Risk Screen Details Patient Name: Date of Service: [name], [name] 04/14/2020 10:30 A M Medical Record Number: [id number] Patient Account Number: [id number] Date of Birth/Sex: Treating RN: 1931/09/26 (86 y.o. [name], Meta.Reding Primary Care Cailey Trigueros: Harvin Hazel M Other Clinician: Referring Noora Locascio: Treating Edelyn Heidel/Extender: Debby Freiberg, Lyman Bishop M Weeks in Treatment: 0 Abuse/Suicide Risk Screen Items Answer ABUSE RISK SCREEN: Has anyone close to you tried to hurt or harm you recentlyo No Do you feel uncomfortable with anyone in your familyo No Has anyone forced you do things that you didnt want to doo No Electronic Signature(s) Signed: 04/14/2020 5:13:43 PM By: Deon Pilling Entered By: Deon Pilling on 04/14/2020 10:56:33 -------------------------------------------------------------------------------- Activities of Daily Living Details Patient Name: Date of Service: [name], [name] 04/14/2020 10:30 A M Medical Record Number: [id number] Patient Account Number: [id number] Date of Birth/Sex: Treating RN: [birth year] (86 y.o. [name], [name] Primary Care Javoni Lucken: Harvin Hazel M Other Clinician: Referring Keiton Cosma: Treating Lissandro Dilorenzo/Extender: Debby Freiberg, Lyman Bishop M Weeks in Treatment: 0 Activities of Daily Living Items Answer Activities of Daily Living (Please select one for each item) Drive Automobile Not Able T Medications ake Completely Able Use T elephone Completely Able Care for Appearance Completely Able Use T oilet Completely Able Bath / Shower Completely Able Dress Self Completely Able Feed Self Completely Able Walk Need Assistance Get In / Out Bed Completely [place] for Self Need Assistance Electronic Signature(s) Signed: 04/14/2020 5:13:43 PM By: Deon Pilling Entered By: Deon Pilling on  04/14/2020 10:57:27 -------------------------------------------------------------------------------- Education Screening Details Patient Name: Date of Service: Jeanette Quiet. 04/14/2020 10:30 A M Medical Record Number: [id number] Patient Account Number: [id number] Date of Birth/Sex: Treating RN: July 05, 1932 (86 y.o. [name], Meta.Reding Primary Care Lennon Richins: Harvin Hazel M Other Clinician: Referring Tavares Levinson: Treating Benecio Kluger/Extender: Debby Freiberg, Lyman Bishop Max Fickle in Treatment: 0 Primary Learner Assessed: Patient Learning Preferences/Education Level/Primary Language Learning Preference: Explanation, Demonstration, Printed Material Highest Education Level: High School Preferred Language: English Cognitive Barrier Language Barrier: No Translator Needed: No Memory Deficit: No Emotional Barrier: No Cultural/Religious Beliefs Affecting Medical Care: No Physical Barrier Impaired Vision: Yes Glasses, reading Impaired Hearing: No Decreased Hand dexterity: No Knowledge/Comprehension Knowledge Level: High Comprehension Level: High Ability to understand written instructions: High Ability to understand verbal instructions: High Motivation Anxiety Level: Calm Cooperation: Cooperative Education Importance: Acknowledges Need Interest in Health Problems: Asks Questions Perception: Coherent Willingness to Engage in Self-Management High Activities: Readiness to Engage in Self-Management High Activities: Electronic Signature(s) Signed: 04/14/2020 5:13:43 PM By: Deon Pilling Entered By: Deon Pilling on 04/14/2020 10:57:49 -------------------------------------------------------------------------------- Fall Risk Assessment Details Patient Name: Date of Service: Jeanette Quiet. 04/14/2020 10:30 A M Medical Record Number: [id number] Patient Account Number: [id number] Date of Birth/Sex: Treating RN: 01-14-1932 (86 y.o. [name], Meta.Reding Primary Care Cathlyn Tersigni: Harvin Hazel  M Other Clinician: Referring Jena Tegeler: Treating Kacper Cartlidge/Extender: Debby Freiberg, Lyman Bishop M Weeks in Treatment: 0 Fall Risk Assessment Items Have you had 2 or more falls in the last 12 monthso 0 Yes Have you had any fall that resulted in injury in the last 12 monthso 0 Yes FALLS RISK SCREEN History of falling - immediate or within 3 months 25 Yes Secondary diagnosis (Do you have 2 or more medical diagnoseso) 0 No Ambulatory aid None/bed rest/wheelchair/nurse 0 No Crutches/cane/walker 15 Yes Furniture 0 No Intravenous  therapy Access/Saline/Heparin Lock 0 No Gait/Transferring Normal/ bed rest/ wheelchair 0 No Weak (short steps with or without shuffle, stooped but able to lift head while walking, may seek 10 Yes support from furniture) Impaired (short steps with shuffle, may have difficulty arising from chair, head down, impaired 0 No balance) Mental Status Oriented to own ability 0 Yes Electronic Signature(s) Signed: 04/14/2020 5:13:43 PM By: Deon Pilling Entered By: Deon Pilling on 04/14/2020 10:58:42 -------------------------------------------------------------------------------- Foot Assessment Details Patient Name: Date of Service: Jeanette Quiet. 04/14/2020 10:30 A M Medical Record Number: [id number] Patient Account Number: [id number] Date of Birth/Sex: Treating RN: Aug 21, 1931 (86 y.o. Debby Bud Primary Care Myrakle Wingler: Harvin Hazel M Other Clinician: Referring Roena Sassaman: Treating Markiesha Delia/Extender: Debby Freiberg, Lyman Bishop M Weeks in Treatment: 0 Foot Assessment Items Site Locations + = Sensation present, - = Sensation absent, C = Callus, U = Ulcer R = Redness, W = Warmth, M = Maceration, PU = Pre-ulcerative lesion F = Fissure, S = Swelling, D = Dryness Assessment Right: Left: Other Deformity: No No Prior Foot Ulcer: No No Prior Amputation: Yes No Charcot Joint: No No Ambulatory Status: Ambulatory With Help Assistance Device: Walker Gait:  Steady Notes right foot transmet. Electronic Signature(s) Signed: 04/14/2020 5:13:43 PM By: Deon Pilling Entered By: Deon Pilling on 04/14/2020 11:19:48 -------------------------------------------------------------------------------- Nutrition Risk Screening Details Patient Name: Date of Service: [name], [name] 04/14/2020 10:30 A M Medical Record Number: [id number] Patient Account Number: [id number] Date of Birth/Sex: Treating RN: 06-27-1932 (86 y.o. [name], Meta.Reding Primary Care Nichoel Digiulio: Harvin Hazel M Other Clinician: Referring Danyon Mcginness: Treating Zali Kamaka/Extender: Debby Freiberg, Lyman Bishop M Weeks in Treatment: 0 Height (in): 68 Weight (lbs): 167 Body Mass Index (BMI): 25.4 Nutrition Risk Screening Items Score Screening NUTRITION RISK SCREEN: I have an illness or condition that made me change the kind and/or amount of food I eat 2 Yes I eat fewer than two meals per day 0 No I eat few fruits and vegetables, or milk products 0 No I have three or more drinks of beer, liquor or wine almost every day 0 No I have tooth or mouth problems that make it hard for me to eat 0 No I don't always have enough money to buy the food I need 0 No I eat alone most of the time 0 No I take three or more different prescribed or over-the-counter drugs a day 1 Yes Without wanting to, I have lost or gained 10 pounds in the last six months 0 No I am not always physically able to shop, cook and/or feed myself 0 No Nutrition Protocols Good Risk Protocol Provide education on elevated blood Moderate Risk Protocol 0 sugars and impact on wound healing, as applicable High Risk Proctocol Risk Level: Moderate Risk Score: 3 Electronic Signature(s) Signed: 04/14/2020 5:13:43 PM By: Deon Pilling Entered By: Deon Pilling on 04/14/2020 10:58:51

## 2020-04-14 NOTE — Progress Notes (Signed)
Jeanette, Matthews ([id number]) Visit Report for 04/14/2020 Chief Complaint Document Details Patient Name: Date of Service: [name], [name] 04/14/2020 10:30 A M Medical Record Number: [id number] Patient Account Number: [id number] Date of Birth/Sex: Treating RN: Aug 04, 1931 (86 y.o. F) Primary Care Provider: Harvin Hazel M Other Clinician: Referring Provider: Treating Provider/Extender: Debby Freiberg, Lyman Bishop M Weeks in Treatment: 0 Information Obtained from: Patient Chief Complaint Right foot ulcers s/p Tarsometatarsal Amputation Electronic Signature(s) Signed: 04/14/2020 11:36:12 AM By: Worthy Keeler PA-C Entered By: Worthy Keeler on 04/14/2020 11:36:12 -------------------------------------------------------------------------------- Debridement Details Patient Name: Date of Service: [name]. 04/14/2020 10:30 A M Medical Record Number: [id number] Patient Account Number: [id number] Date of Birth/Sex: Treating RN: 23-Dec-1931 (86 y.o. [name] Primary Care Provider: Harvin Hazel M Other Clinician: Referring Provider: Treating Provider/Extender: Debby Freiberg, Lyman Bishop M Weeks in Treatment: 0 Debridement Performed for Assessment: Wound #2 Right Amputation Site - Transmetatarsal Performed By: Physician Worthy Keeler, PA Debridement Type: Debridement Severity of Tissue Pre Debridement: Fat layer exposed Level of Consciousness (Pre-procedure): Awake and Alert Pre-procedure Verification/Time Out Yes - 11:45 Taken: Start Time: 11:52 Pain Control: Other : benzocaine 20% spray T Area Debrided (L x W): otal 5.2 (cm) x 7.8 (cm) = 40.56 (cm) Tissue and other material debrided: Viable, Non-Viable, Eschar, Slough, Subcutaneous, Tendon, Slough Level: Skin/Subcutaneous Tissue/Muscle Debridement Description: Excisional Instrument: Forceps Bleeding: Minimum Hemostasis Achieved: Pressure End Time: 12:00 Procedural Pain: 3 Post Procedural Pain: 0 Response to  Treatment: Procedure was tolerated well Level of Consciousness (Post- Awake and Alert procedure): Post Debridement Measurements of Total Wound Length: (cm) 5.2 Width: (cm) 7.8 Depth: (cm) 1.2 Volume: (cm) 38.227 Character of Wound/Ulcer Post Debridement: Requires Further Debridement Severity of Tissue Post Debridement: Necrosis of muscle Post Procedure Diagnosis Same as Pre-procedure Electronic Signature(s) Signed: 04/14/2020 5:36:27 PM By: Baruch Gouty RN, BSN Signed: 04/14/2020 5:47:16 PM By: Worthy Keeler PA-C Entered By: Baruch Gouty on 04/14/2020 12:00:23 -------------------------------------------------------------------------------- HPI Details Patient Name: Date of Service: [name]. 04/14/2020 10:30 A M Medical Record Number: [id number] Patient Account Number: [id number] Date of Birth/Sex: Treating RN: 01/21/1932 (86 y.o. F) Primary Care Provider: Marykay Lex Other Clinician: Referring Provider: Treating Provider/Extender: Debby Freiberg, Lyman Bishop M Weeks in Treatment: 0 History of Present Illness HPI Description: 04/14/2020 upon evaluation today patient appears to be doing somewhat poorly in regard to her foot ulcers. She has a transmetatarsal amputation site which unfortunately has not healed as effectively as would have been hoped for. She also has a small wound on the lateral portion of her foot which seems to be unrelated necessarily to that and very superficial. Nonetheless the amputation site has a tremendous amount of necrotic tissue there is also 9 sutures remaining at this point. Nonetheless I feel like that the patient is going require debridement of this area she was referred to [place] by Dr. Doran Durand for evaluation and treatment he has suggested per his note that I reviewed from 03/31/2020 that she may require revision surgery to a below-knee amputation. With that being said obviously the patient wants to avoid this. I do believe that she may be a  candidate for a wound VAC for that reason I am going to perform debridement to remove the sutures as well as necrotic tissue to some degree so that we can get things moving along with regard to the wound VAC as well. Obviously I do not believe that any of the  necrotic tissue nor the sutures are really doing much for her as far as healing is concerned. She does have a history of diabetes mellitus type 2 she also has peripheral vascular disease fortunately she was seen by Dr. Trula Slade on 04/08/2020. He did actually reestablish good blood flow to the limb there was a 10 cm segment of the right superficial femoral artery with a 60% stenosis treated with balloon angioplasty. She also did have a subtotal occlusion to the origin of the right peroneal artery treated with arthrectomy and 3 mm balloon angioplasty there was no residual stenosis the patient does have some left renal artery stenosis. The good news is I do believe she has much better blood flow hopefully this will benefit her from the standpoint of healing. The patient's most recent hemoglobin A1c was 8.3. Unfortunately this all started with an ankle fracture in March subsequent to this she had a cast that was placed on her leg and apparently it was when they remove the cast for the 2nd time that they noticed this area underneath on her foot that led to what was thought to be initially just a 1st and 2nd toe amputation but led to a transmetatarsal amputation. That was on 03/11/2020. Electronic Signature(s) Signed: 04/14/2020 5:13:21 PM By: Worthy Keeler PA-C Entered By: Worthy Keeler on 04/14/2020 17:13:20 -------------------------------------------------------------------------------- Physical Exam Details Patient Name: Date of Service: [name], [name] 04/14/2020 10:30 A M Medical Record Number: [id number] Patient Account Number: [id number] Date of Birth/Sex: Treating RN: 08/05/1931 (86 y.o. F) Primary Care Provider: Harvin Hazel M Other  Clinician: Referring Provider: Treating Provider/Extender: Debby Freiberg, Lyman Bishop M Weeks in Treatment: 0 Constitutional sitting or standing blood pressure is within target range for patient.. pulse regular and within target range for patient.Marland Kitchen respirations regular, non-labored and within target range for patient.Marland Kitchen temperature within target range for patient.. Well-nourished and well-hydrated in no acute distress. Eyes conjunctiva clear no eyelid edema noted. pupils equal round and reactive to light and accommodation. Ears, Nose, Mouth, and Throat no gross abnormality of ear auricles or external auditory canals. normal hearing noted during conversation. mucus membranes moist. Respiratory normal breathing without difficulty. Cardiovascular 1+ dorsalis pedis/posterior tibialis pulses. no clubbing, cyanosis, significant edema, <3 sec cap refill. Musculoskeletal Patient unable to walk without assistance. Psychiatric this patient is able to make decisions and demonstrates good insight into disease process. Alert and Oriented x 3. pleasant and cooperative. Notes Upon inspection patient's wound currently did have significant necrotic tissue noted over the surface of the wound which is going require sharp debridement at this point. I discussed with the patient that obviously I do not believe the sutures are doing anything if at most for loose or no longer pulling any good tissue together just hanging out and necrotic tissue. We did therefore remove all 9 sutures that were remaining. Subsequently also using scissors and forceps removed a significant amount of the necrotic tissue from the surface of the wound everything that we can recommend continuing with the Santyl as well which I think will hopefully benefit her. Again this is the transmetatarsal amputation site that is the worst at this point. With regard to the lateral foot ulcer this is very minimal at this point as far as the  ulceration is concerned I think this will do quite well with the use of Santyl. Electronic Signature(s) Signed: 04/14/2020 5:14:40 PM By: Worthy Keeler PA-C Entered By: Worthy Keeler on 04/14/2020 17:14:40 -------------------------------------------------------------------------------- Physician Orders Details  Patient Name: Date of Service: [name], [name] 04/14/2020 10:30 A M Medical Record Number: [id number] Patient Account Number: [id number] Date of Birth/Sex: Treating RN: [birth year] (86 y.o. [name] Primary Care Provider: Harvin Hazel M Other Clinician: Referring Provider: Treating Provider/Extender: Debby Freiberg, Lyman Bishop M Weeks in Treatment: 0 Verbal / Phone Orders: No Diagnosis Coding ICD-10 Coding Code Description E11.621 Type 2 diabetes mellitus with foot ulcer T81.31XA Disruption of external operation (surgical) wound, not elsewhere classified, initial encounter L97.512 Non-pressure chronic ulcer of other part of right foot with fat layer exposed I73.89 Other specified peripheral vascular diseases Follow-up Appointments Return Appointment in 1 week. Dressing Change Frequency Wound #1 Right,Medial Foot Change dressing every day. Wound #2 Right Amputation Site - Transmetatarsal Change dressing every day. - while using santyl Wound Cleansing Wound #1 Right,Medial Foot Clean wound with Normal Saline. May shower with protection. Wound #2 Right Amputation Site - Transmetatarsal Clean wound with Normal Saline. May shower with protection. Primary Wound Dressing Wound #1 Right,Medial Foot Santyl Ointment Wound #2 Right Amputation Site - Transmetatarsal Santyl Ointment Secondary Dressing Wound #1 Right,Medial Foot Kerlix/Rolled Gauze Dry Gauze Other: - ace wrap Wound #2 Right Amputation Site - Transmetatarsal Kerlix/Rolled Gauze Dry Gauze Negative Presssure Wound Therapy Wound #2 Right Amputation Site - Transmetatarsal Wound Vac to wound  continuously at 146mm/hg pressure - home to apply once available, to be changed 3 times per week Black Foam Off-Loading Removable cast walker boot to: - right foot to ambulate Other: - no weight bearing right foot [place] [facility] for wound care. Jackquline Denmark Electronic Signature(s) Signed: 04/14/2020 5:36:27 PM By: Baruch Gouty RN, BSN Signed: 04/14/2020 5:47:16 PM By: Worthy Keeler PA-C Entered By: Baruch Gouty on 04/14/2020 12:08:08 -------------------------------------------------------------------------------- Problem List Details Patient Name: Date of Service: [name]. 04/14/2020 10:30 A M Medical Record Number: [id number] Patient Account Number: [id number] Date of Birth/Sex: Treating RN: 12-Oct-1931 (86 y.o. F) Primary Care Provider: Harvin Hazel M Other Clinician: Referring Provider: Treating Provider/Extender: Debby Freiberg, Lyman Bishop M Weeks in Treatment: 0 Active Problems ICD-10 Encounter Code Description Active Date MDM Diagnosis E11.621 Type 2 diabetes mellitus with foot ulcer 04/14/2020 No Yes T81.31XA Disruption of external operation (surgical) wound, not elsewhere classified, 04/14/2020 No Yes initial encounter L97.512 Non-pressure chronic ulcer of other part of right foot with fat layer exposed 04/14/2020 No Yes I73.89 Other specified peripheral vascular diseases 04/14/2020 No Yes Inactive Problems Resolved Problems Electronic Signature(s) Signed: 04/14/2020 11:35:22 AM By: Worthy Keeler PA-C Entered By: Worthy Keeler on 04/14/2020 11:35:22 -------------------------------------------------------------------------------- Progress Note Details Patient Name: Date of Service: [name]. 04/14/2020 10:30 A M Medical Record Number: [id number] Patient Account Number: [id number] Date of Birth/Sex: Treating RN: Nov 17, 1931 (86 y.o. F) Primary Care Provider: Marykay Lex Other Clinician: Referring  Provider: Treating Provider/Extender: Debby Freiberg, Lyman Bishop M Weeks in Treatment: 0 Subjective Chief Complaint Information obtained from Patient Right foot ulcers s/p Tarsometatarsal Amputation History of Present Illness (HPI) 04/14/2020 upon evaluation today patient appears to be doing somewhat poorly in regard to her foot ulcers. She has a transmetatarsal amputation site which unfortunately has not healed as effectively as would have been hoped for. She also has a small wound on the lateral portion of her foot which seems to be unrelated necessarily to that and very superficial. Nonetheless the amputation site has a tremendous amount of necrotic tissue there is also 9 sutures remaining  at this point. Nonetheless I feel like that the patient is going require debridement of this area she was referred to [place] by Dr. Doran Durand for evaluation and treatment he has suggested per his note that I reviewed from 03/31/2020 that she may require revision surgery to a below-knee amputation. With that being said obviously the patient wants to avoid this. I do believe that she may be a candidate for a wound VAC for that reason I am going to perform debridement to remove the sutures as well as necrotic tissue to some degree so that we can get things moving along with regard to the wound VAC as well. Obviously I do not believe that any of the necrotic tissue nor the sutures are really doing much for her as far as healing is concerned. She does have a history of diabetes mellitus type 2 she also has peripheral vascular disease fortunately she was seen by Dr. Trula Slade on 04/08/2020. He did actually reestablish good blood flow to the limb there was a 10 cm segment of the right superficial femoral artery with a 60% stenosis treated with balloon angioplasty. She also did have a subtotal occlusion to the origin of the right peroneal artery treated with arthrectomy and 3 mm balloon angioplasty there was no residual  stenosis the patient does have some left renal artery stenosis. The good news is I do believe she has much better blood flow hopefully this will benefit her from the standpoint of healing. The patient's most recent hemoglobin A1c was 8.3. Unfortunately this all started with an ankle fracture in March subsequent to this she had a cast that was placed on her leg and apparently it was when they remove the cast for the 2nd time that they noticed this area underneath on her foot that led to what was thought to be initially just a 1st and 2nd toe amputation but led to a transmetatarsal amputation. That was on 03/11/2020. Patient History Information obtained from Patient. Allergies Sulfa (Sulfonamide Antibiotics) (Reaction: dehydration) Family History Cancer - Child, Diabetes - Mother, Heart Disease - Father, No family history of Hereditary Spherocytosis, Hypertension, Kidney Disease, Lung Disease, Seizures, Stroke, Thyroid Problems, Tuberculosis. Social History Never smoker, Marital Status - Married, Alcohol Use - Never, Drug Use - No History, Caffeine Use - Daily - tea and coffee. Medical History Eyes Denies history of Cataracts, Glaucoma, Optic Neuritis Ear/Nose/Mouth/Throat Denies history of Chronic sinus problems/congestion, Middle ear problems Hematologic/Lymphatic Denies history of Anemia, Hemophilia, Human Immunodeficiency Virus, Lymphedema, Sickle Cell Disease Respiratory Denies history of Aspiration, Asthma, Chronic Obstructive Pulmonary Disease (COPD), Pneumothorax, Sleep Apnea, Tuberculosis Cardiovascular Patient has history of Congestive Heart Failure, Deep Vein Thrombosis, Hypertension, Peripheral Arterial Disease, Peripheral Venous Disease Denies history of Angina, Arrhythmia, Coronary Artery Disease, Hypotension, Myocardial Infarction, Phlebitis, Vasculitis Gastrointestinal Denies history of Cirrhosis , Colitis, Crohnoos, Hepatitis A, Hepatitis B, Hepatitis C Endocrine Patient  has history of Type II Diabetes Denies history of Type I Diabetes Genitourinary Denies history of End Stage Renal Disease Immunological Denies history of Lupus Erythematosus, Raynaudoos, Scleroderma Integumentary (Skin) Denies history of History of Burn Musculoskeletal Denies history of Gout, Rheumatoid Arthritis, Osteoarthritis, Osteomyelitis Neurologic Patient has history of Neuropathy Denies history of Dementia, Quadriplegia, Paraplegia, Seizure Disorder Oncologic Denies history of Received Chemotherapy, Received Radiation Psychiatric Denies history of Anorexia/bulimia, Confinement Anxiety Patient is treated with Insulin. Blood sugar is tested. Hospitalization/Surgery History - 04/08/2020 Angiogram- angioplasty, arthrectomy. - 03/11/2020 Right foot transmet. Medical A Surgical History Notes nd Constitutional Symptoms (General Health) thyroid disease  Review of Systems (ROS) Constitutional Symptoms (General Health) Denies complaints or symptoms of Fatigue, Fever, Chills, Marked Weight Change. Eyes Complains or has symptoms of Glasses / Contacts - reading glasses. Denies complaints or symptoms of Dry Eyes, Vision Changes. Ear/Nose/Mouth/Throat Denies complaints or symptoms of Chronic sinus problems or rhinitis. Respiratory Denies complaints or symptoms of Chronic or frequent coughs, Shortness of Breath. Cardiovascular Denies complaints or symptoms of Chest pain. Gastrointestinal Denies complaints or symptoms of Frequent diarrhea, Nausea, Vomiting. Genitourinary Denies complaints or symptoms of Frequent urination. Integumentary (Skin) Complains or has symptoms of Wounds - transmet to right foot. Musculoskeletal Denies complaints or symptoms of Muscle Pain, Muscle Weakness. Neurologic Denies complaints or symptoms of Numbness/parasthesias. Psychiatric Denies complaints or symptoms of Claustrophobia, Suicidal. Objective Constitutional sitting or standing blood pressure is  within target range for patient.. pulse regular and within target range for patient.Marland Kitchen respirations regular, non-labored and within target range for patient.Marland Kitchen temperature within target range for patient.. Well-nourished and well-hydrated in no acute distress. Vitals Time Taken: 10:52 AM, Height: 68 in, Source: Stated, Weight: 167 lbs, Source: Stated, BMI: 25.4, Temperature: 98.2 F, Pulse: 69 bpm, Respiratory Rate: 18 breaths/min, Blood Pressure: 131/81 mmHg, Capillary Blood Glucose: 120 mg/dl. Eyes conjunctiva clear no eyelid edema noted. pupils equal round and reactive to light and accommodation. Ears, Nose, Mouth, and Throat no gross abnormality of ear auricles or external auditory canals. normal hearing noted during conversation. mucus membranes moist. Respiratory normal breathing without difficulty. Cardiovascular 1+ dorsalis pedis/posterior tibialis pulses. no clubbing, cyanosis, significant edema, Musculoskeletal Patient unable to walk without assistance. Psychiatric this patient is able to make decisions and demonstrates good insight into disease process. Alert and Oriented x 3. pleasant and cooperative. General Notes: Upon inspection patient's wound currently did have significant necrotic tissue noted over the surface of the wound which is going require sharp debridement at this point. I discussed with the patient that obviously I do not believe the sutures are doing anything if at most for loose or no longer pulling any good tissue together just hanging out and necrotic tissue. We did therefore remove all 9 sutures that were remaining. Subsequently also using scissors and forceps removed a significant amount of the necrotic tissue from the surface of the wound everything that we can recommend continuing with the Santyl as well which I think will hopefully benefit her. Again this is the transmetatarsal amputation site that is the worst at this point. With regard to the lateral foot  ulcer this is very minimal at this point as far as the ulceration is concerned I think this will do quite well with the use of Santyl. Integumentary (Hair, Skin) Wound #1 status is Open. Original cause of wound was Gradually Appeared. The wound is located on the Right,Medial Foot. The wound measures 0.9cm length x 0.8cm width x 0.2cm depth; 0.565cm^2 area and 0.113cm^3 volume. There is Fat Layer (Subcutaneous Tissue) exposed. There is no tunneling or undermining noted. There is a medium amount of serosanguineous drainage noted. The wound margin is distinct with the outline attached to the wound base. There is no granulation within the wound bed. There is a large (67-100%) amount of necrotic tissue within the wound bed including Adherent Slough. Wound #2 status is Open. Original cause of wound was Surgical Injury. The wound is located on the Right Amputation Site - Transmetatarsal. The wound measures 5.2cm length x 7.8cm width x 1.2cm depth; 31.856cm^2 area and 38.227cm^3 volume. There is no tunneling or undermining noted. There is a  large amount of serosanguineous drainage noted. The wound margin is epibole. There is a large (67-100%) amount of necrotic tissue within the wound bed including Eschar and Adherent Slough. General Notes: x9 sutures. Assessment Active Problems ICD-10 Type 2 diabetes mellitus with foot ulcer Disruption of external operation (surgical) wound, not elsewhere classified, initial encounter Non-pressure chronic ulcer of other part of right foot with fat layer exposed Other specified peripheral vascular diseases Procedures Wound #2 Pre-procedure diagnosis of Wound #2 is an Open Surgical Wound located on the Right Amputation Site - Transmetatarsal .Severity of Tissue Pre Debridement is: Fat layer exposed. There was a Excisional Skin/Subcutaneous Tissue/Muscle Debridement with a total area of 40.56 sq cm performed by Worthy Keeler, PA. With the following instrument(s):  Forceps to remove Viable and Non-Viable tissue/material. Material removed includes Eschar, T endon, Subcutaneous Tissue, and Slough after achieving pain control using Other (benzocaine 20% spray). No specimens were taken. A time out was conducted at 11:45, prior to the start of the procedure. A Minimum amount of bleeding was controlled with Pressure. The procedure was tolerated well with a pain level of 3 throughout and a pain level of 0 following the procedure. Post Debridement Measurements: 5.2cm length x 7.8cm width x 1.2cm depth; 38.227cm^3 volume. Character of Wound/Ulcer Post Debridement requires further debridement. Severity of Tissue Post Debridement is: Necrosis of muscle. Post procedure Diagnosis Wound #2: Same as Pre-Procedure Plan Follow-up Appointments: Return Appointment in 1 week. Dressing Change Frequency: Wound #1 Right,Medial Foot: Change dressing every day. Wound #2 Right Amputation Site - Transmetatarsal: Change dressing every day. - while using santyl Wound Cleansing: Wound #1 Right,Medial Foot: Clean wound with Normal Saline. May shower with protection. Wound #2 Right Amputation Site - Transmetatarsal: Clean wound with Normal Saline. May shower with protection. Primary Wound Dressing: Wound #1 Right,Medial Foot: Santyl Ointment Wound #2 Right Amputation Site - Transmetatarsal: Santyl Ointment Secondary Dressing: Wound #1 Right,Medial Foot: Kerlix/Rolled Gauze Dry Gauze Other: - ace wrap Wound #2 Right Amputation Site - Transmetatarsal: Kerlix/Rolled Gauze Dry Gauze Negative Presssure Wound Therapy: Wound #2 Right Amputation Site - Transmetatarsal: Wound Vac to wound continuously at 155mm/hg pressure - home to apply once available, to be changed 3 times per week Black Foam Off-Loading: Removable cast walker boot to: - right foot to ambulate Other: - no weight bearing right foot Home Health: [facility] for wound care. -  [facility] 1. I would recommend currently that we going to continue with the wound care measures as before utilizing Santyl to both wound locations. We will use saline moistened gauze to pack into the transmetatarsal amputation site at this time. 2. I am to recommend a wound VAC for the transmetatarsal amputation site as I believe this will be the ideal thing for the patient obviously the wound bed is not clear yet but hopefully this should help clear up some of the necrotic tissue and allow this to start granulating in obviously that is what she needs. Her only other option is looking at a below-knee amputation which she wants to avoid obviously. 3. I am also can recommend at this time that the patient continue with appropriate offloading I really would recommend that she not be walking at all if at all possible I would even suggest eventually that using a wheelchair would be a good idea. Nonetheless she needs to limit her walking significantly at minimum. We will see patient back for reevaluation in 1 week here in the clinic. If anything worsens or  changes patient will contact our office for additional recommendations. Electronic Signature(s) Signed: 04/14/2020 5:15:38 PM By: Worthy Keeler PA-C Entered By: Worthy Keeler on 04/14/2020 17:15:38 -------------------------------------------------------------------------------- HxROS Details Patient Name: Date of Service: [name]. 04/14/2020 10:30 A M Medical Record Number: [id number] Patient Account Number: [id number] Date of Birth/Sex: Treating RN: December 28, 1931 (86 y.o. Debby Bud Primary Care Provider: Harvin Hazel M Other Clinician: Referring Provider: Treating Provider/Extender: Debby Freiberg, Lyman Bishop M Weeks in Treatment: 0 Information Obtained From Patient Constitutional Symptoms (General Health) Complaints and Symptoms: Negative for: Fatigue; Fever; Chills; Marked Weight Change Medical History: Past Medical History  Notes: thyroid disease Eyes Complaints and Symptoms: Positive for: Glasses / Contacts - reading glasses Negative for: Dry Eyes; Vision Changes Medical History: Negative for: Cataracts; Glaucoma; Optic Neuritis Ear/Nose/Mouth/Throat Complaints and Symptoms: Negative for: Chronic sinus problems or rhinitis Medical History: Negative for: Chronic sinus problems/congestion; Middle ear problems Respiratory Complaints and Symptoms: Negative for: Chronic or frequent coughs; Shortness of Breath Medical History: Negative for: Aspiration; Asthma; Chronic Obstructive Pulmonary Disease (COPD); Pneumothorax; Sleep Apnea; Tuberculosis Cardiovascular Complaints and Symptoms: Negative for: Chest pain Medical History: Positive for: Congestive Heart Failure; Deep Vein Thrombosis; Hypertension; Peripheral Arterial Disease; Peripheral Venous Disease Negative for: Angina; Arrhythmia; Coronary Artery Disease; Hypotension; Myocardial Infarction; Phlebitis; Vasculitis Gastrointestinal Complaints and Symptoms: Negative for: Frequent diarrhea; Nausea; Vomiting Medical History: Negative for: Cirrhosis ; Colitis; Crohns; Hepatitis A; Hepatitis B; Hepatitis C Genitourinary Complaints and Symptoms: Negative for: Frequent urination Medical History: Negative for: End Stage Renal Disease Integumentary (Skin) Complaints and Symptoms: Positive for: Wounds - transmet to right foot Medical History: Negative for: History of Burn Musculoskeletal Complaints and Symptoms: Negative for: Muscle Pain; Muscle Weakness Medical History: Negative for: Gout; Rheumatoid Arthritis; Osteoarthritis; Osteomyelitis Neurologic Complaints and Symptoms: Negative for: Numbness/parasthesias Medical History: Positive for: Neuropathy Negative for: Dementia; Quadriplegia; Paraplegia; Seizure Disorder Psychiatric Complaints and Symptoms: Negative for: Claustrophobia; Suicidal Medical History: Negative for: Anorexia/bulimia;  Confinement Anxiety Hematologic/Lymphatic Medical History: Negative for: Anemia; Hemophilia; Human Immunodeficiency Virus; Lymphedema; Sickle Cell Disease Endocrine Medical History: Positive for: Type II Diabetes Negative for: Type I Diabetes Time with diabetes: 20 years Treated with: Insulin Blood sugar tested every day: Yes Tested : 3-4 times Immunological Medical History: Negative for: Lupus Erythematosus; Raynauds; Scleroderma Oncologic Medical History: Negative for: Received Chemotherapy; Received Radiation Immunizations Pneumococcal Vaccine: Received Pneumococcal Vaccination: No Implantable Devices None Hospitalization / Surgery History Type of Hospitalization/Surgery 04/08/2020 Angiogram- angioplasty, arthrectomy 03/11/2020 Right foot transmet Family and Social History Cancer: Yes - Child; Diabetes: Yes - Mother; Heart Disease: Yes - Father; Hereditary Spherocytosis: No; Hypertension: No; Kidney Disease: No; Lung Disease: No; Seizures: No; Stroke: No; Thyroid Problems: No; Tuberculosis: No; Never smoker; Marital Status - Married; Alcohol Use: Never; Drug Use: No History; Caffeine Use: Daily - tea and coffee; Financial Concerns: No; Food, Clothing or Shelter Needs: No; Support System Lacking: No; Transportation Concerns: No Electronic Signature(s) Signed: 04/14/2020 5:13:43 PM By: Deon Pilling Signed: 04/14/2020 5:47:16 PM By: Worthy Keeler PA-C Entered By: Deon Pilling on 04/14/2020 11:08:10 -------------------------------------------------------------------------------- SuperBill Details Patient Name: Date of Service: [name] 04/14/2020 Medical Record Number: [id number] Patient Account Number: [id number] Date of Birth/Sex: Treating RN: 12/03/1931 (86 y.o. [name] Primary Care Provider: Harvin Hazel M Other Clinician: Referring Provider: Treating Provider/Extender: Debby Freiberg, Lyman Bishop M Weeks in Treatment: 0 Diagnosis Coding ICD-10  Codes Code Description E11.621 Type 2 diabetes mellitus with foot ulcer T81.31XA Disruption of external  operation (surgical) wound, not elsewhere classified, initial encounter L97.512 Non-pressure chronic ulcer of other part of right foot with fat layer exposed I73.89 Other specified peripheral vascular diseases Facility Procedures CPT4 Code: 92010071 Description: [place] VISIT-LEV 3 EST PT Modifier: 25 Quantity: 1 CPT4 Code: 21975883 Description: [place] - DEB MUSC/FASCIA 20 SQ CM/< ICD-10 Diagnosis Description L97.512 Non-pressure chronic ulcer of other part of right foot with fat layer exposed Modifier: Quantity: 1 CPT4 Code: 25498264 Description: 11046 - DEB MUSC/FASCIA EA ADDL 20 CM ICD-10 Diagnosis Description L97.512 Non-pressure chronic ulcer of other part of right foot with fat layer exposed Modifier: Quantity: 2 Physician Procedures : CPT4 Code Description Modifier [id number] 07680 - WC PHYS LEVEL 4 - NEW PT 25 ICD-10 Diagnosis Description E11.621 Type 2 diabetes mellitus with foot ulcer T81.31XA Disruption of external operation (surgical) wound, not elsewhere classified, initial  encounter L97.512 Non-pressure chronic ulcer of other part of right foot with fat layer exposed I73.89 Other specified peripheral vascular diseases Quantity: 1 : [id number] 59458 - WC PHYS DEBR MUSCLE/FASCIA 20 SQ CM ICD-10 Diagnosis Description L97.512 Non-pressure chronic ulcer of other part of right foot with fat layer exposed Quantity: 1 Electronic Signature(s) Signed: 04/14/2020 5:16:04 PM By: Worthy Keeler PA-C Entered By: Worthy Keeler on 04/14/2020 17:16:04

## 2020-04-15 DIAGNOSIS — N182 Chronic kidney disease, stage 2 (mild): Secondary | ICD-10-CM | POA: Diagnosis not present

## 2020-04-15 DIAGNOSIS — E1129 Type 2 diabetes mellitus with other diabetic kidney complication: Secondary | ICD-10-CM | POA: Diagnosis not present

## 2020-04-15 DIAGNOSIS — I10 Essential (primary) hypertension: Secondary | ICD-10-CM | POA: Diagnosis not present

## 2020-04-15 DIAGNOSIS — Z794 Long term (current) use of insulin: Secondary | ICD-10-CM | POA: Diagnosis not present

## 2020-04-15 DIAGNOSIS — I130 Hypertensive heart and chronic kidney disease with heart failure and stage 1 through stage 4 chronic kidney disease, or unspecified chronic kidney disease: Secondary | ICD-10-CM | POA: Diagnosis not present

## 2020-04-16 ENCOUNTER — Other Ambulatory Visit: Payer: Self-pay

## 2020-04-16 DIAGNOSIS — I779 Disorder of arteries and arterioles, unspecified: Secondary | ICD-10-CM

## 2020-04-16 NOTE — Progress Notes (Signed)
DENA, ESPERANZA ([id number]) Visit Report for 04/14/2020 Allergy List Details Patient Name: Date of Service: MICKEY, HEBEL 04/14/2020 10:30 A M Medical Record Number: [id number] Patient Account Number: [id number] Date of Birth/Sex: Treating RN: 1931-07-28 (86 y.o. Debby Bud Primary Care [place]: Harvin Hazel M Other Clinician: Referring Juniper Cobey: Treating Amalia Edgecombe/Extender: Debby Freiberg, Lyman Bishop M Weeks in Treatment: 0 Allergies Active Allergies Sulfa (Sulfonamide Antibiotics) Reaction: dehydration Allergy Notes Electronic Signature(s) Signed: 04/14/2020 5:13:43 PM By: Deon Pilling Entered By: Deon Pilling on 04/14/2020 10:55:51 -------------------------------------------------------------------------------- Arrival Information Details Patient Name: Date of Service: [name] 04/14/2020 10:30 A M Medical Record Number: [id number] Patient Account Number: [id number] Date of Birth/Sex: Treating RN: 02-12-1932 (86 y.o. [name], [name] Primary Care Ayuub Penley: Harvin Hazel M Other Clinician: Referring Lacoya Wilbanks: Treating Braian Tijerina/Extender: Debby Freiberg, Lyman Bishop M Weeks in Treatment: 0 Visit Information Patient Arrived: [place] Arrival Time: 10:53 Accompanied By: friend Transfer Assistance: None Patient Identification Verified: Yes Secondary Verification Process Completed: Yes Patient Requires Transmission-Based Precautions: No Patient Has Alerts: Yes Patient Alerts: Patient on Blood Thinner Electronic Signature(s) Signed: 04/14/2020 5:13:43 PM By: Deon Pilling Entered By: Deon Pilling on 04/14/2020 10:54:15 -------------------------------------------------------------------------------- Clinic Level of Care Assessment Details Patient Name: Date of Service: DONELL, SLIWINSKI 04/14/2020 10:30 A M Medical Record Number: [id number] Patient Account Number: [id number] Date of Birth/Sex: Treating RN: 1931/09/08 (86 y.o. [name] Primary Care  Angely Dietz: Harvin Hazel M Other Clinician: Referring Lucky Alverson: Treating Asuncion Shibata/Extender: Debby Freiberg, Lyman Bishop M Weeks in Treatment: 0 Clinic Level of Care Assessment Items TOOL 1 Quantity Score []  - 0 Use when EandM and Procedure is performed on INITIAL visit ASSESSMENTS - Nursing Assessment / Reassessment X- 1 20 General Physical Exam (combine w/ comprehensive assessment (listed just below) when performed on new pt. evals) X- 1 25 Comprehensive Assessment (HX, ROS, Risk Assessments, Wounds Hx, etc.) ASSESSMENTS - Wound and Skin Assessment / Reassessment []  - 0 Dermatologic / Skin Assessment (not related to wound area) ASSESSMENTS - Ostomy and/or Continence Assessment and Care []  - 0 Incontinence Assessment and Management []  - 0 Ostomy Care Assessment and Management (repouching, etc.) PROCESS - Coordination of Care X - Simple Patient / Family Education for ongoing care 1 15 []  - 0 Complex (extensive) Patient / Family Education for ongoing care X- 1 10 Staff obtains Programmer, systems, Records, T Results / Process Orders est []  - 0 Staff telephones HHA, Nursing Homes / Clarify orders / etc []  - 0 Routine Transfer to another Facility (non-emergent condition) []  - 0 Routine Hospital Admission (non-emergent condition) X- 1 15 New Admissions / Biomedical engineer / Ordering NPWT Apligraf, etc. , []  - 0 Emergency Hospital Admission (emergent condition) PROCESS - Special Needs []  - 0 Pediatric / Minor Patient Management []  - 0 Isolation Patient Management []  - 0 Hearing / Language / Visual special needs []  - 0 Assessment of Community assistance (transportation, D/C planning, etc.) []  - 0 Additional assistance / Altered mentation []  - 0 Support Surface(s) Assessment (bed, cushion, seat, etc.) INTERVENTIONS - Miscellaneous []  - 0 External ear exam []  - 0 Patient Transfer (multiple staff / Civil Service fast streamer / Similar devices) []  - 0 Simple Staple / Suture removal (25  or less) []  - 0 Complex Staple / Suture removal (26 or more) []  - 0 Hypo/Hyperglycemic Management (do not check if billed separately) []  - 0 Ankle / Brachial Index (ABI) - do not check if billed separately Has the patient been  seen at the hospital within the last three years: Yes Total Score: 85 Level Of Care: New/Established - Level 3 Electronic Signature(s) Signed: 04/14/2020 5:36:27 PM By: Baruch Gouty RN, BSN Entered By: Baruch Gouty on 04/14/2020 11:46:18 -------------------------------------------------------------------------------- Encounter Discharge Information Details Patient Name: Date of Service: [name] 04/14/2020 10:30 A M Medical Record Number: [id number] Patient Account Number: [id number] Date of Birth/Sex: Treating RN: 03/14/1932 (86 y.o. [name] Primary Care Lear Carstens: Harvin Hazel M Other Clinician: Referring Ryun Velez: Treating Calea Hribar/Extender: Debby Freiberg, Lyman Bishop M Weeks in Treatment: 0 Encounter Discharge Information Items Post Procedure Vitals Discharge Condition: Stable Temperature (F): 98.2 Ambulatory Status: Ambulatory Pulse (bpm): 69 Discharge Destination: Home Respiratory Rate (breaths/min): 18 Transportation: Private Auto Blood Pressure (mmHg): 131/81 Accompanied By: daughter Schedule Follow-up Appointment: Yes Clinical Summary of Care: Patient Declined Electronic Signature(s) Signed: 04/16/2020 4:20:16 PM By: Carlene Coria RN Entered By: Carlene Coria on 04/14/2020 12:22:34 -------------------------------------------------------------------------------- Lower Extremity Assessment Details Patient Name: Date of Service: REYLENE, STAUDER 04/14/2020 10:30 A M Medical Record Number: [id number] Patient Account Number: [id number] Date of Birth/Sex: Treating RN: 1932/04/28 (86 y.o. Debby Bud Primary Care Perline Awe: Harvin Hazel M Other Clinician: Referring Hadeel Hillebrand: Treating Yuki Brunsman/Extender: Debby Freiberg, Lyman Bishop M Weeks in Treatment: 0 Edema Assessment Assessed: [Left: No] [Right: Yes] Edema: [Left: Ye] [Right: s] Calf Left: Right: Point of Measurement: 32 cm From Medial Instep 35 cm Ankle Left: Right: Point of Measurement: 11 cm From Medial Instep 20 cm Vascular Assessment Pulses: Dorsalis Pedis Palpable: [Right:Yes] Electronic Signature(s) Signed: 04/14/2020 5:13:43 PM By: Deon Pilling Entered By: Deon Pilling on 04/14/2020 11:20:12 -------------------------------------------------------------------------------- Leon Details Patient Name: Date of Service: [name] 04/14/2020 10:30 A M Medical Record Number: [id number] Patient Account Number: [id number] Date of Birth/Sex: Treating RN: 03-06-1932 (86 y.o. [name] Primary Care Misao Fackrell: Harvin Hazel M Other Clinician: Referring Johny Pitstick: Treating Deisi Salonga/Extender: Debby Freiberg, Lyman Bishop M Weeks in Treatment: 0 Active Inactive Nutrition Nursing Diagnoses: Impaired glucose control: actual or potential Potential for alteratiion in Nutrition/Potential for imbalanced nutrition Goals: Patient/caregiver will maintain therapeutic glucose control Date Initiated: 04/14/2020 Target Resolution Date: 05/12/2020 Goal Status: Active Interventions: Assess HgA1c results as ordered upon admission and as needed Assess patient nutrition upon admission and as needed per policy Treatment Activities: Patient referred to Primary Care Physician for further nutritional evaluation : 04/14/2020 Notes: Tissue Oxygenation Nursing Diagnoses: Actual ineffective tissue perfusion; peripheral (select once diagnosis is confirmed) Knowledge deficit related to disease process and management Goals: Patient/caregiver will verbalize understanding of disease process and disease management Date Initiated: 04/14/2020 Target Resolution Date: 05/12/2020 Goal Status: Active Interventions: Assess  patient understanding of disease process and management upon diagnosis and as needed Assess peripheral arterial status upon admission and as needed Provide education on tissue oxygenation and ischemia Notes: Wound/Skin Impairment Nursing Diagnoses: Impaired tissue integrity Knowledge deficit related to ulceration/compromised skin integrity Goals: Patient/caregiver will verbalize understanding of skin care regimen Date Initiated: 04/14/2020 Target Resolution Date: 05/12/2020 Goal Status: Active Ulcer/skin breakdown will have a volume reduction of 30% by week 4 Date Initiated: 04/14/2020 Target Resolution Date: 05/12/2020 Goal Status: Active Interventions: Assess patient/caregiver ability to obtain necessary supplies Assess patient/caregiver ability to perform ulcer/skin care regimen upon admission and as needed Assess ulceration(s) every visit Provide education on ulcer and skin care Treatment Activities: Skin care regimen initiated : 04/14/2020 Topical wound management initiated : 04/14/2020 Notes: Electronic Signature(s) Signed: 04/14/2020 5:36:27 PM By: Johna Roles,  Jacques Navy, BSN Entered By: Baruch Gouty on 04/14/2020 11:44:47 -------------------------------------------------------------------------------- Pain Assessment Details Patient Name: Date of Service: KALEYAH, LABRECK 04/14/2020 10:30 A M Medical Record Number: [id number] Patient Account Number: [id number] Date of Birth/Sex: Treating RN: 1932/05/23 (86 y.o. Debby Bud Primary Care Hayes Czaja: Harvin Hazel M Other Clinician: Referring Nainoa Woldt: Treating Kiersten Coss/Extender: Debby Freiberg, Lyman Bishop M Weeks in Treatment: 0 Active Problems Location of Pain Severity and Description of Pain Patient Has Paino Yes Site Locations Pain Location: Generalized Pain Rate the pain. Current Pain Level: 4 Worst Pain Level: 10 Least Pain Level: 0 Tolerable Pain Level: 8 Pain Management and Medication Current Pain  Management: Medication: No Cold Application: No Rest: No Massage: No Activity: No T.E.N.S.: No Heat Application: No Leg drop or elevation: No Is the Current Pain Management Adequate: Inadequate How does your wound impact your activities of daily livingo Sleep: No Bathing: No Appetite: No Relationship With Others: No Bladder Continence: No Emotions: No Bowel Continence: No Work: No Toileting: No Drive: No Dressing: No Hobbies: No Notes phantom pains 4/10 Electronic Signature(s) Signed: 04/14/2020 5:13:43 PM By: Deon Pilling Entered By: Deon Pilling on 04/14/2020 10:59:26 -------------------------------------------------------------------------------- Patient/Caregiver Education Details Patient Name: Date of Service: Janell Quiet 9/29/2021andnbsp10:30 A M Medical Record Number: [id number] Patient Account Number: [id number] Date of Birth/Gender: Treating RN: 29-Nov-1931 (86 y.o. [name] Primary Care Physician: Harvin Hazel M Other Clinician: Referring Physician: Treating Physician/Extender: Debby Freiberg, Lyman Bishop Max Fickle in Treatment: 0 Education Assessment Education Provided To: Patient Education Topics Provided Elevated Blood Sugar/ Impact on Healing: Handouts: Elevated Blood Sugars: How Do They Affect Wound Healing Methods: Explain/Verbal, Printed Responses: Reinforcements needed, State content correctly [place]: o Handouts: Welcome T [facility] o Methods: Explain/Verbal, Printed Responses: Reinforcements needed, State content correctly Wound/Skin Impairment: Handouts: Caring for Your Ulcer, Skin Care Do's and Dont's Methods: Explain/Verbal Responses: Reinforcements needed, State content correctly Electronic Signature(s) Signed: 04/14/2020 5:36:27 PM By: Baruch Gouty RN, BSN Entered By: Baruch Gouty on 04/14/2020  11:45:33 -------------------------------------------------------------------------------- Wound Assessment Details Patient Name: Date of Service: [name] 04/14/2020 10:30 A M Medical Record Number: [id number] Patient Account Number: [id number] Date of Birth/Sex: Treating RN: 1931/10/18 (86 y.o. [name], Meta.Reding Primary Care Lothar Prehn: Harvin Hazel M Other Clinician: Referring Aarian Griffie: Treating Rondel Episcopo/Extender: Debby Freiberg, Lyman Bishop M Weeks in Treatment: 0 Wound Status Wound Number: 1 Primary Diabetic Wound/Ulcer of the Lower Extremity Etiology: Wound Location: Right, Medial Foot Wound Open Wounding Event: Gradually Appeared Status: Date Acquired: 03/11/2020 Comorbid Congestive Heart Failure, Deep Vein Thrombosis, Hypertension, Weeks Of Treatment: 0 History: Peripheral Arterial Disease, Peripheral Venous Disease, Type II Clustered Wound: No Diabetes, Neuropathy Wound Measurements Length: (cm) 0.9 Width: (cm) 0.8 Depth: (cm) 0.2 Area: (cm) 0.565 Volume: (cm) 0.113 % Reduction in Area: % Reduction in Volume: Epithelialization: None Tunneling: No Undermining: No Wound Description Classification: Grade 2 Wound Margin: Distinct, outline attached Exudate Amount: Medium Exudate Type: Serosanguineous Exudate Color: red, brown Foul Odor After Cleansing: No Slough/Fibrino Yes Wound Bed Granulation Amount: None Present (0%) Exposed Structure Necrotic Amount: Large (67-100%) Fascia Exposed: No Necrotic Quality: Adherent Slough Fat Layer (Subcutaneous Tissue) Exposed: Yes Tendon Exposed: No Muscle Exposed: No Joint Exposed: No Bone Exposed: No Treatment Notes Wound #1 (Right, Medial Foot) 1. Cleanse With Wound Cleanser 3. Primary Dressing Applied Santyl 4. Secondary Dressing Dry Gauze 5. Secured With Recruitment consultant) Signed: 04/14/2020 5:13:43 PM By: Rolin Barry,  Bobbi Entered By: Deon Pilling on 04/14/2020  11:22:06 -------------------------------------------------------------------------------- Wound Assessment Details Patient Name: Date of Service: CAMEKA, RAE 04/14/2020 10:30 A M Medical Record Number: [id number] Patient Account Number: [id number] Date of Birth/Sex: Treating RN: 13-Jan-1932 (86 y.o. [name], Meta.Reding Primary Care Donyel Nester: Harvin Hazel M Other Clinician: Referring Jaleisa Brose: Treating Rishit Burkhalter/Extender: Debby Freiberg, Lyman Bishop M Weeks in Treatment: 0 Wound Status Wound Number: 2 Primary Open Surgical Wound Etiology: Wound Location: Right Amputation Site - Transmetatarsal Secondary Arterial Insufficiency Ulcer Wounding Event: Surgical Injury Etiology: Date Acquired: 03/11/2020 Wound Open Weeks Of Treatment: 0 Status: Clustered Wound: No Comorbid Congestive Heart Failure, Deep Vein Thrombosis, Hypertension, History: Peripheral Arterial Disease, Peripheral Venous Disease, Type II Diabetes, Neuropathy Wound Measurements Length: (cm) 5.2 Width: (cm) 7.8 Depth: (cm) 1.2 Area: (cm) 31.856 Volume: (cm) 38.227 % Reduction in Area: % Reduction in Volume: Epithelialization: None Tunneling: No Undermining: No Wound Description Classification: Unclassifiable Wound Margin: Epibole Exudate Amount: Large Exudate Type: Serosanguineous Exudate Color: red, brown Foul Odor After Cleansing: No Slough/Fibrino Yes Wound Bed Necrotic Amount: Large (67-100%) Exposed Structure Necrotic Quality: Eschar, Adherent Slough Fascia Exposed: No Fat Layer (Subcutaneous Tissue) Exposed: No Tendon Exposed: No Muscle Exposed: No Joint Exposed: No Bone Exposed: No Assessment Notes x9 sutures. Treatment Notes Wound #2 (Right Amputation Site - Transmetatarsal) 1. Cleanse With Wound Cleanser 3. Primary Dressing Applied Santyl 4. Secondary Dressing Dry Gauze 5. Secured With Recruitment consultant) Signed: 04/14/2020 5:13:43 PM By: Deon Pilling Entered By:  Deon Pilling on 04/14/2020 11:23:17 -------------------------------------------------------------------------------- Vitals Details Patient Name: Date of Service: [name] 04/14/2020 10:30 A M Medical Record Number: [id number] Patient Account Number: [id number] Date of Birth/Sex: Treating RN: Oct 11, 1931 (86 y.o. [name], Meta.Reding Primary Care Treyvone Chelf: Harvin Hazel M Other Clinician: Referring Braylea Brancato: Treating Krislynn Gronau/Extender: Debby Freiberg, Lyman Bishop M Weeks in Treatment: 0 Vital Signs Time Taken: 10:52 Temperature (F): 98.2 Height (in): 68 Pulse (bpm): 69 Source: Stated Respiratory Rate (breaths/min): 18 Weight (lbs): 167 Blood Pressure (mmHg): 131/81 Source: Stated Capillary Blood Glucose (mg/dl): 120 Body Mass Index (BMI): 25.4 Reference Range: 80 - 120 mg / dl Electronic Signature(s) Signed: 04/14/2020 5:13:43 PM By: Deon Pilling Entered By: Deon Pilling on 04/14/2020 10:55:27

## 2020-04-16 NOTE — Patient Outreach (Signed)
  [facility]) Care Management Chronic Special Needs Program  04/16/2020  Name: REECE MCBROOM DOB: 11/29/1931  MRN: [id number]  Ms. Melinna Linarez is enrolled in a chronic special needs plan for Diabetes. RNCM called to follow up.   Subjective: Client reports she received a follow up call from [facility] and they are working to get home health services for her. She reports she had an appointment with the wound care center on September 29 th and reports plans to have a wound vac at follow up appointment next Wednesday. Client states she had a follow up appointment with her surgeon today. Client reports she is changing dressings without difficulty at this time. And she reports she has support and transportation to appointments.  Per client, Stanton Kidney with [facility] called to follow up with her today and Home visit is scheduled for April 27, 2020.  Goals Addressed            This Visit's Progress   . Client will verbalize understanding of treatment plan for impaired skin integrity and follow up with provider within the next 3 months.   On track    Wound care management discussed. Continue to change dressing as prescribed by provider Continue to follow up with the wound care center as recommended. Observe for signs/symptoms of infection: redness, increased pain; increased swelling or drainage pus or cloudy fluid draining from the wound.    . General - Client will not be readmitted within 30 days (C-SNP) discharged 04/02/2020   On track    Please follow discharge instructions and call provider if you have any questions. Please attend all follow up appointments as scheduled. Please take your medications as prescribed. Please continue to call [facility] [phone] as needed.        Plan: RNCM will update [facility]; RNCM will send updated care plan to client; send updated care plan to primary care provider; next outreach within the next 1-2 months.     Thea Silversmith, RN, MSN, [place] [phone]

## 2020-04-20 DIAGNOSIS — Z794 Long term (current) use of insulin: Secondary | ICD-10-CM | POA: Diagnosis not present

## 2020-04-20 DIAGNOSIS — J9691 Respiratory failure, unspecified with hypoxia: Secondary | ICD-10-CM | POA: Diagnosis not present

## 2020-04-20 DIAGNOSIS — I5032 Chronic diastolic (congestive) heart failure: Secondary | ICD-10-CM | POA: Diagnosis not present

## 2020-04-20 DIAGNOSIS — Z9181 History of falling: Secondary | ICD-10-CM | POA: Diagnosis not present

## 2020-04-20 DIAGNOSIS — Z7902 Long term (current) use of antithrombotics/antiplatelets: Secondary | ICD-10-CM | POA: Diagnosis not present

## 2020-04-20 DIAGNOSIS — E782 Mixed hyperlipidemia: Secondary | ICD-10-CM | POA: Diagnosis not present

## 2020-04-20 DIAGNOSIS — E049 Nontoxic goiter, unspecified: Secondary | ICD-10-CM | POA: Diagnosis not present

## 2020-04-20 DIAGNOSIS — I110 Hypertensive heart disease with heart failure: Secondary | ICD-10-CM | POA: Diagnosis not present

## 2020-04-20 DIAGNOSIS — Z4781 Encounter for orthopedic aftercare following surgical amputation: Secondary | ICD-10-CM | POA: Diagnosis not present

## 2020-04-20 DIAGNOSIS — Z8701 Personal history of pneumonia (recurrent): Secondary | ICD-10-CM | POA: Diagnosis not present

## 2020-04-20 DIAGNOSIS — I2510 Atherosclerotic heart disease of native coronary artery without angina pectoris: Secondary | ICD-10-CM | POA: Diagnosis not present

## 2020-04-20 DIAGNOSIS — D649 Anemia, unspecified: Secondary | ICD-10-CM | POA: Diagnosis not present

## 2020-04-20 DIAGNOSIS — E1151 Type 2 diabetes mellitus with diabetic peripheral angiopathy without gangrene: Secondary | ICD-10-CM | POA: Diagnosis not present

## 2020-04-20 DIAGNOSIS — E039 Hypothyroidism, unspecified: Secondary | ICD-10-CM | POA: Diagnosis not present

## 2020-04-20 DIAGNOSIS — K219 Gastro-esophageal reflux disease without esophagitis: Secondary | ICD-10-CM | POA: Diagnosis not present

## 2020-04-21 ENCOUNTER — Encounter (HOSPITAL_BASED_OUTPATIENT_CLINIC_OR_DEPARTMENT_OTHER): Payer: HMO | Attending: Physician Assistant | Admitting: Physician Assistant

## 2020-04-21 DIAGNOSIS — I509 Heart failure, unspecified: Secondary | ICD-10-CM | POA: Insufficient documentation

## 2020-04-21 DIAGNOSIS — I110 Hypertensive heart disease with heart failure: Secondary | ICD-10-CM | POA: Insufficient documentation

## 2020-04-21 DIAGNOSIS — E1151 Type 2 diabetes mellitus with diabetic peripheral angiopathy without gangrene: Secondary | ICD-10-CM | POA: Insufficient documentation

## 2020-04-21 DIAGNOSIS — L97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: Secondary | ICD-10-CM | POA: Insufficient documentation

## 2020-04-21 DIAGNOSIS — E11621 Type 2 diabetes mellitus with foot ulcer: Secondary | ICD-10-CM | POA: Insufficient documentation

## 2020-04-21 DIAGNOSIS — I771 Stricture of artery: Secondary | ICD-10-CM | POA: Insufficient documentation

## 2020-04-21 DIAGNOSIS — E1140 Type 2 diabetes mellitus with diabetic neuropathy, unspecified: Secondary | ICD-10-CM | POA: Insufficient documentation

## 2020-04-21 DIAGNOSIS — T8189XA Other complications of procedures, not elsewhere classified, initial encounter: Secondary | ICD-10-CM | POA: Diagnosis not present

## 2020-04-21 DIAGNOSIS — L97512 Non-pressure chronic ulcer of other part of right foot with fat layer exposed: Secondary | ICD-10-CM | POA: Diagnosis not present

## 2020-04-21 NOTE — Progress Notes (Addendum)
[name], [name] ([id number]) Visit Report for 04/21/2020 Chief Complaint Document Details Patient Name: Date of Service: [name], [name] 04/21/2020 9:45 A M Medical Record Number: [id number] Patient Account Number: [id number] Date of Birth/Sex: Treating RN: 1931/11/23 (86 y.o. [name] Primary Care Provider: Marton Redwood Other Clinician: Referring Provider: Treating Provider/Extender: Jarome Matin in Treatment: 1 Information Obtained from: Patient Chief Complaint Right foot ulcers s/p Tarsometatarsal Amputation Electronic Signature(s) Signed: 04/21/2020 9:36:28 AM By: Worthy Keeler PA-C Entered By: Worthy Keeler on 04/21/2020 09:36:27 -------------------------------------------------------------------------------- Debridement Details Patient Name: Date of Service: Jeanette Quiet. 04/21/2020 9:45 A M Medical Record Number: [id number] Patient Account Number: [id number] Date of Birth/Sex: Treating RN: July 24, 1931 (86 y.o. Martyn Malay, Vaughan Basta Primary Care Provider: Marton Redwood Other Clinician: Referring Provider: Treating Provider/Extender: Jarome Matin in Treatment: 1 Debridement Performed for Assessment: Wound #2 Right Amputation Site - Transmetatarsal Performed By: Physician Worthy Keeler, PA Debridement Type: Debridement Severity of Tissue Pre Debridement: Necrosis of muscle Level of Consciousness (Pre-procedure): Awake and Alert Pre-procedure Verification/Time Out Yes - 10:55 Taken: Start Time: 10:55 Pain Control: Other : benzocaine 20% spray T Area Debrided (L x W): otal 5 (cm) x 7.5 (cm) = 37.5 (cm) Tissue and other material debrided: Viable, Non-Viable, Slough, Subcutaneous, Tendon, Slough Level: Skin/Subcutaneous Tissue/Muscle Debridement Description: Excisional Instrument: Curette, Forceps, Scissors Bleeding: Minimum Hemostasis Achieved: Pressure End Time: 11:00 Procedural Pain: 6 Post Procedural Pain:  3 Response to Treatment: Procedure was tolerated well Level of Consciousness (Post- Awake and Alert procedure): Post Debridement Measurements of Total Wound Length: (cm) 5 Width: (cm) 7.5 Depth: (cm) 1.8 Volume: (cm) 53.014 Character of Wound/Ulcer Post Debridement: Requires Further Debridement Severity of Tissue Post Debridement: Necrosis of muscle Post Procedure Diagnosis Same as Pre-procedure Electronic Signature(s) Signed: 04/21/2020 6:14:29 PM By: Baruch Gouty RN, BSN Signed: 04/21/2020 6:38:49 PM By: Worthy Keeler PA-C Entered By: Baruch Gouty on 04/21/2020 10:59:36 -------------------------------------------------------------------------------- Debridement Details Patient Name: Date of Service: Jeanette Quiet. 04/21/2020 9:45 A M Medical Record Number: [id number] Patient Account Number: [id number] Date of Birth/Sex: Treating RN: Jun 02, 1932 (86 y.o. Martyn Malay, Vaughan Basta Primary Care Provider: Marton Redwood Other Clinician: Referring Provider: Treating Provider/Extender: Jarome Matin in Treatment: 1 Debridement Performed for Assessment: Wound #1 Right,Medial Foot Performed By: Physician Worthy Keeler, PA Debridement Type: Debridement Severity of Tissue Pre Debridement: Fat layer exposed Level of Consciousness (Pre-procedure): Awake and Alert Pre-procedure Verification/Time Out Yes - 10:55 Taken: Start Time: 10:55 Pain Control: Other : benzocaine 20% spray T Area Debrided (L x W): otal 0.8 (cm) x 0.7 (cm) = 0.56 (cm) Tissue and other material debrided: Viable, Non-Viable, Slough, Subcutaneous, Slough Level: Skin/Subcutaneous Tissue Debridement Description: Excisional Instrument: Curette Bleeding: Minimum Hemostasis Achieved: Pressure End Time: 11:00 Procedural Pain: 6 Post Procedural Pain: 3 Response to Treatment: Procedure was tolerated well Level of Consciousness (Post- Awake and Alert procedure): Post Debridement Measurements  of Total Wound Length: (cm) 0.8 Width: (cm) 0.7 Depth: (cm) 0.1 Volume: (cm) 0.044 Character of Wound/Ulcer Post Debridement: Requires Further Debridement Severity of Tissue Post Debridement: Fat layer exposed Post Procedure Diagnosis Same as Pre-procedure Electronic Signature(s) Signed: 04/21/2020 6:14:29 PM By: Baruch Gouty RN, BSN Signed: 04/21/2020 6:38:49 PM By: Worthy Keeler PA-C Entered By: Baruch Gouty on 04/21/2020 11:03:55 -------------------------------------------------------------------------------- HPI Details Patient Name: Date of Service: Jeanette Quiet. 04/21/2020 9:45 A M Medical Record Number: [id number] Patient Account Number: [id number] Date of Birth/Sex: Treating RN: 25-Nov-1931 (86  y.o. [name] Primary Care Provider: Marton Redwood Other Clinician: Referring Provider: Treating Provider/Extender: Jarome Matin in Treatment: 1 History of Present Illness HPI Description: 04/14/2020 upon evaluation today patient appears to be doing somewhat poorly in regard to her foot ulcers. She has a transmetatarsal amputation site which unfortunately has not healed as effectively as would have been hoped for. She also has a small wound on the lateral portion of her foot which seems to be unrelated necessarily to that and very superficial. Nonetheless the amputation site has a tremendous amount of necrotic tissue there is also 9 sutures remaining at this point. Nonetheless I feel like that the patient is going require debridement of this area she was referred to [place] by Dr. Doran Durand for evaluation and treatment he has suggested per his note that I reviewed from 03/31/2020 that she may require revision surgery to a below-knee amputation. With that being said obviously the patient wants to avoid this. I do believe that she may be a candidate for a wound VAC for that reason I am going to perform debridement to remove the sutures as well as necrotic  tissue to some degree so that we can get things moving along with regard to the wound VAC as well. Obviously I do not believe that any of the necrotic tissue nor the sutures are really doing much for her as far as healing is concerned. She does have a history of diabetes mellitus type 2 she also has peripheral vascular disease fortunately she was seen by Dr. Trula Slade on 04/08/2020. He did actually reestablish good blood flow to the limb there was a 10 cm segment of the right superficial femoral artery with a 60% stenosis treated with balloon angioplasty. She also did have a subtotal occlusion to the origin of the right peroneal artery treated with arthrectomy and 3 mm balloon angioplasty there was no residual stenosis the patient does have some left renal artery stenosis. The good news is I do believe she has much better blood flow hopefully this will benefit her from the standpoint of healing. The patient's most recent hemoglobin A1c was 8.3. Unfortunately this all started with an ankle fracture in March subsequent to this she had a cast that was placed on her leg and apparently it was when they remove the cast for the 2nd time that they noticed this area underneath on her foot that led to what was thought to be initially just a 1st and 2nd toe amputation but led to a transmetatarsal amputation. That was on 03/11/2020. 04/21/2020 upon evaluation today patient actually appears to be doing quite well in regard to her foot ulcer all things considered. This is still significant wound there is some tendon exposed which is becoming necrotic and will need to be removed but other than this overall I feel like she is actually doing quite well. I do believe that wound VAC will benefit her with that being said we do not have the approval for that she received a box from [facility] but unfortunately is out of network therefore began to get it through a different company. Electronic Signature(s) Signed: 04/21/2020 11:07:56  AM By: Worthy Keeler PA-C Entered By: Worthy Keeler on 04/21/2020 11:07:55 -------------------------------------------------------------------------------- Physical Exam Details Patient Name: Date of Service: [name], [name] 04/21/2020 9:45 A M Medical Record Number: [id number] Patient Account Number: [id number] Date of Birth/Sex: Treating RN: 1931-08-17 (86 y.o. [name] Primary Care Provider: Marton Redwood Other Clinician: Referring Provider:  Treating Provider/Extender: Jarome Matin in Treatment: 1 Constitutional Well-nourished and well-hydrated in no acute distress. Respiratory normal breathing without difficulty. Psychiatric this patient is able to make decisions and demonstrates good insight into disease process. Alert and Oriented x 3. pleasant and cooperative. Notes Upon inspection patient's wound bed actually did have necrotic tissue noted at both wound locations both were sharply debrided away today with a curette and scissors and forceps for the main wound. She tolerated that today without complication post debridement wound bed appears to be doing better though still not completely clean. Electronic Signature(s) Signed: 04/21/2020 11:08:15 AM By: Worthy Keeler PA-C Entered By: Worthy Keeler on 04/21/2020 11:08:15 -------------------------------------------------------------------------------- Physician Orders Details Patient Name: Date of Service: Jeanette Quiet. 04/21/2020 9:45 A M Medical Record Number: [id number] Patient Account Number: [id number] Date of Birth/Sex: Treating RN: 1931/07/22 (86 y.o. [name] Primary Care Provider: Marton Redwood Other Clinician: Referring Provider: Treating Provider/Extender: Jarome Matin in Treatment: 1 Verbal / Phone Orders: No Diagnosis Coding ICD-10 Coding Code Description E11.621 Type 2 diabetes mellitus with foot ulcer T81.31XA Disruption of external  operation (surgical) wound, not elsewhere classified, initial encounter L97.512 Non-pressure chronic ulcer of other part of right foot with fat layer exposed I73.89 Other specified peripheral vascular diseases Follow-up Appointments Return Appointment in 2 weeks. Dressing Change Frequency Wound #1 Right,Medial Foot Change dressing every day. Wound #2 Right Amputation Site - Transmetatarsal Change dressing every day. - while using santyl, until NPWT available from [facility] then 3 times per week Wound Cleansing Wound #1 Right,Medial Foot Clean wound with Normal Saline. May shower with protection. Wound #2 Right Amputation Site - Transmetatarsal Clean wound with Normal Saline. May shower with protection. Primary Wound Dressing Wound #1 Right,Medial Foot Santyl Ointment Wound #2 Right Amputation Site - Transmetatarsal Santyl Ointment Secondary Dressing Wound #1 Right,Medial Foot Kerlix/Rolled Gauze Dry Gauze Other: - ace wrap Wound #2 Right Amputation Site - Transmetatarsal Kerlix/Rolled Gauze Dry Gauze Negative Presssure Wound Therapy Wound #2 Right Amputation Site - Transmetatarsal Wound Vac to wound continuously at 183mm/hg pressure - home health to apply once available from [facility], to be changed 3 times per week Black Foam Off-Loading Removable cast walker boot to: - right foot to ambulate Other: - no weight bearing right foot [place] skilled nursing for wound care. Jackquline [place] Patient Medications llergies: Sulfa (Sulfonamide Antibiotics) A Notifications Medication Indication Start End 04/21/2020 Santyl DOSE topical 250 unit/gram ointment - ointment topical Apply nickel thick daily to the wound bed and then cover with a dressing as directed in clinic Electronic Signature(s) Signed: 04/21/2020 11:07:01 AM By: Worthy Keeler PA-C Entered By: Worthy Keeler on 04/21/2020  11:07:00 -------------------------------------------------------------------------------- Problem List Details Patient Name: Date of Service: Jeanette Quiet. 04/21/2020 9:45 A M Medical Record Number: [id number] Patient Account Number: [id number] Date of Birth/Sex: Treating RN: 08/31/1931 (86 y.o. Martyn Malay, Linda Primary Care Provider: Marton Redwood Other Clinician: Referring Provider: Treating Provider/Extender: Jarome Matin in Treatment: 1 Active Problems ICD-10 Encounter Code Description Active Date MDM Diagnosis E11.621 Type 2 diabetes mellitus with foot ulcer 04/14/2020 No Yes T81.31XA Disruption of external operation (surgical) wound, not elsewhere classified, 04/14/2020 No Yes initial encounter L97.512 Non-pressure chronic ulcer of other part of right foot with fat layer exposed 04/14/2020 No Yes L97.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 04/21/2020 No Yes I73.89 Other specified peripheral vascular  diseases 04/14/2020 No Yes Inactive Problems Resolved Problems Electronic Signature(s) Signed: 04/21/2020 5:29:03 PM By: Worthy Keeler PA-C Previous Signature: 04/21/2020 9:36:05 AM Version By: Worthy Keeler PA-C Entered By: Worthy Keeler on 04/21/2020 17:29:03 -------------------------------------------------------------------------------- Progress Note Details Patient Name: Date of Service: Jeanette Quiet. 04/21/2020 9:45 A M Medical Record Number: [id number] Patient Account Number: [id number] Date of Birth/Sex: Treating RN: 1931-09-06 (86 y.o. [name] Primary Care Provider: Marton Redwood Other Clinician: Referring Provider: Treating Provider/Extender: Jarome Matin in Treatment: 1 Subjective Chief Complaint Information obtained from Patient Right foot ulcers s/p Tarsometatarsal Amputation History of Present Illness (HPI) 04/14/2020 upon evaluation today patient appears  to be doing somewhat poorly in regard to her foot ulcers. She has a transmetatarsal amputation site which unfortunately has not healed as effectively as would have been hoped for. She also has a small wound on the lateral portion of her foot which seems to be unrelated necessarily to that and very superficial. Nonetheless the amputation site has a tremendous amount of necrotic tissue there is also 9 sutures remaining at this point. Nonetheless I feel like that the patient is going require debridement of this area she was referred to [place] by Dr. Doran Durand for evaluation and treatment he has suggested per his note that I reviewed from 03/31/2020 that she may require revision surgery to a below-knee amputation. With that being said obviously the patient wants to avoid this. I do believe that she may be a candidate for a wound VAC for that reason I am going to perform debridement to remove the sutures as well as necrotic tissue to some degree so that we can get things moving along with regard to the wound VAC as well. Obviously I do not believe that any of the necrotic tissue nor the sutures are really doing much for her as far as healing is concerned. She does have a history of diabetes mellitus type 2 she also has peripheral vascular disease fortunately she was seen by Dr. Trula Slade on 04/08/2020. He did actually reestablish good blood flow to the limb there was a 10 cm segment of the right superficial femoral artery with a 60% stenosis treated with balloon angioplasty. She also did have a subtotal occlusion to the origin of the right peroneal artery treated with arthrectomy and 3 mm balloon angioplasty there was no residual stenosis the patient does have some left renal artery stenosis. The good news is I do believe she has much better blood flow hopefully this will benefit her from the standpoint of healing. The patient's most recent hemoglobin A1c was 8.3. Unfortunately this all started with an ankle fracture in  March subsequent to this she had a cast that was placed on her leg and apparently it was when they remove the cast for the 2nd time that they noticed this area underneath on her foot that led to what was thought to be initially just a 1st and 2nd toe amputation but led to a transmetatarsal amputation. That was on 03/11/2020. 04/21/2020 upon evaluation today patient actually appears to be doing quite well in regard to her foot ulcer all things considered. This is still significant wound there is some tendon exposed which is becoming necrotic and will need to be removed but other than this overall I feel like she is actually doing quite well. I do believe that wound VAC will benefit her with that being said we do not have the approval for that she received a  box from [facility] but unfortunately is out of network therefore began to get it through a different company. Objective Constitutional Well-nourished and well-hydrated in no acute distress. Vitals Time Taken: 9:38 AM, Height: 68 in, Weight: 167 lbs, BMI: 25.4, Temperature: 98.2 F, Pulse: 76 bpm, Respiratory Rate: 18 breaths/min, Blood Pressure: 116/67 mmHg, Capillary Blood Glucose: 160 mg/dl. Respiratory normal breathing without difficulty. Psychiatric this patient is able to make decisions and demonstrates good insight into disease process. Alert and Oriented x 3. pleasant and cooperative. General Notes: Upon inspection patient's wound bed actually did have necrotic tissue noted at both wound locations both were sharply debrided away today with a curette and scissors and forceps for the main wound. She tolerated that today without complication post debridement wound bed appears to be doing better though still not completely clean. Integumentary (Hair, Skin) Wound #1 status is Open. Original cause of wound was Gradually Appeared. The wound is located on the Right,Medial Foot. The wound measures 0.8cm length x 0.7cm width x 0.1cm depth; 0.44cm^2  area and 0.044cm^3 volume. There is Fat Layer (Subcutaneous Tissue) exposed. There is no tunneling or undermining noted. There is a medium amount of serosanguineous drainage noted. The wound margin is distinct with the outline attached to the wound base. There is no granulation within the wound bed. There is a large (67-100%) amount of necrotic tissue within the wound bed including Adherent Slough. Wound #2 status is Open. Original cause of wound was Surgical Injury. The wound is located on the Right Amputation Site - Transmetatarsal. The wound measures 5cm length x 7.5cm width x 1.8cm depth; 29.452cm^2 area and 53.014cm^3 volume. There is no tunneling or undermining noted. There is a large amount of serosanguineous drainage noted. The wound margin is epibole. There is a large (67-100%) amount of necrotic tissue within the wound bed including Eschar and Adherent Slough. 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 There was a Excisional Skin/Subcutaneous Tissue Debridement with a total area of 0.56 sq cm performed by Worthy Keeler, PA. With the following instrument(s): Curette to remove Viable and Non-Viable tissue/material. Material removed includes Subcutaneous Tissue and Slough and after achieving pain control using Other (benzocaine 20% spray). No specimens were taken. A time out was conducted at 10:55, prior to the start of the procedure. A Minimum amount of bleeding was  controlled with Pressure. The procedure was tolerated well with a pain level of 6 throughout and a pain level of 3 following the procedure. Post Debridement Measurements: 0.8cm length x 0.7cm width x 0.1cm depth; 0.044cm^3 volume. Character of Wound/Ulcer Post Debridement requires further debridement. Severity of Tissue Post Debridement is: Fat layer exposed. Post procedure Diagnosis Wound #1: Same as Pre-Procedure Wound #2 Pre-procedure diagnosis of Wound #2 is an Open Surgical Wound located on the Right Amputation Site - Transmetatarsal .Severity of Tissue Pre Debridement is: Necrosis of muscle. There was a Excisional Skin/Subcutaneous Tissue/Muscle Debridement with a total area of 37.5 sq cm performed by Worthy Keeler, PA. With the following instrument(s): Curette, Forceps, and Scissors to remove Viable and  Non-Viable tissue/material. Material removed includes T endon, Subcutaneous Tissue, and Slough after achieving pain control using Other (benzocaine 20% spray). No specimens were taken. A time out was conducted at 10:55, prior to the start of the procedure. A Minimum amount of bleeding was controlled with Pressure. The procedure was tolerated well with a pain level of 6 throughout and a pain level of 3 following the procedure. Post Debridement Measurements: 5cm length x 7.5cm width x 1.8cm depth; 53.014cm^3 volume. Character of Wound/Ulcer Post Debridement requires further debridement. Severity of Tissue Post Debridement is: Necrosis of muscle. Post procedure Diagnosis Wound #2: Same as Pre-Procedure Plan Follow-up Appointments: Return Appointment in 2 weeks. Dressing Change Frequency: Wound #1 Right,Medial Foot: Change dressing every day. Wound #2 Right Amputation Site - Transmetatarsal: Change dressing every day. - while using santyl, until NPWT available from [facility] then 3 times per week Wound Cleansing: Wound #1 Right,Medial Foot: Clean wound with Normal Saline. May shower with  protection. Wound #2 Right Amputation Site - Transmetatarsal: Clean wound with Normal Saline. May shower with protection. Primary Wound Dressing: Wound #1 Right,Medial Foot: Santyl Ointment Wound #2 Right Amputation Site - Transmetatarsal: Santyl Ointment Secondary Dressing: Wound #1 Right,Medial Foot: Kerlix/Rolled Gauze Dry Gauze Other: - ace wrap Wound #2 Right Amputation Site - Transmetatarsal: Kerlix/Rolled Gauze Dry Gauze Negative Presssure Wound Therapy: Wound #2 Right Amputation Site - Transmetatarsal: Wound Vac to wound continuously at 123mm/hg pressure - home health to apply once available from [facility], to be changed 3 times per week Black Foam Off-Loading: Removable cast walker boot to: - right foot to ambulate Other: - no weight bearing right foot Home Health: [place] skilled [facility] for wound care. Jackquline [place] The following medication(s) was prescribed: Santyl topical 250 unit/gram ointment ointment topical Apply nickel thick daily to the wound bed and then cover with a dressing as directed in clinic starting 04/21/2020 1. I would recommend currently that we continue with the wound care measures as before and the patient is in agreement with the plan. This includes using Santyl for the time being followed by saline moistened gauze and behind. 2. I do think the wound VAC as soon as we get this started would be ideal obviously we have not gotten the appropriate approval for that yet but were working on it. 3. It also recommend continued offloading as much as possible to keep any pressure from the area I think the more she can do this to better. I did send refills for the Santyl into the pharmacy for her. We will see patient back for reevaluation in 2 weeks here in the clinic. If anything worsens or changes patient will contact our office for additional recommendations. Electronic Signature(s) Signed: 04/21/2020 5:29:58 PM By: Worthy Keeler PA-C Previous  Signature: 04/21/2020 11:09:04 AM Version By: Worthy Keeler PA-C Entered By: Worthy Keeler on 04/21/2020 17:29:58 -------------------------------------------------------------------------------- SuperBill Details Patient Name: Date of Service: Jeanette Quiet 04/21/2020 Medical Record Number: [id number] Patient Account Number: [id number] Date of Birth/Sex: Treating RN: October 20, 1931 (86 y.o. 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 I73.89 Other  specified peripheral vascular diseases Facility Procedures CPT4 Code: [id number] Description: 93112 - DEB SUBQ TISSUE 20 SQ CM/< ICD-10 Diagnosis Description L97.512 Non-pressure chronic ulcer of other part of right foot with fat layer exposed Modifier: Quantity: 1 CPT4 Code: 16244695 Description: 07225 - DEB MUSC/FASCIA 20 SQ CM/< ICD-10 Diagnosis Description L97.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 Modifier: Quantity: 1 CPT4 Code: [id number] Description: 11046 - DEB MUSC/FASCIA EA ADDL 20 CM ICD-10 Diagnosis Description L97.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 Modifier: Quantity: 1 Physician Procedures : CPT4 Code Description Modifier [id number] 84210 - WC PHYS LEVEL 4 - EST PT 25 ICD-10 Diagnosis Description E11.621 Type 2 diabetes mellitus with foot ulcer T81.31XA Disruption of external operation (surgical) wound, not elsewhere classified, initial  encounter  L97.512 Non-pressure chronic ulcer of other part of right foot with fat layer exposed I73.89 Other specified peripheral vascular diseases Quantity: 1 : [id number] 86773 - WC PHYS SUBQ TISS 20 SQ CM ICD-10 Diagnosis Description L97.512 Non-pressure chronic ulcer of other part of right foot with fat layer exposed Quantity: 1 : [id number] 94707 - WC PHYS DEBR MUSCLE/FASCIA 20 SQ CM ICD-10 Diagnosis Description L97.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 Quantity: 1 : [id number] 11046 - WC PHYS DEB MUSC/FASC EA ADDL 20 CM ICD-10 Diagnosis Description L97.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 Quantity: 1 Electronic Signature(s) Signed: 04/21/2020 5:31:29 PM By: Worthy Keeler PA-C Previous Signature: 04/21/2020 11:17:42 AM Version By: Worthy Keeler PA-C Previous Signature: 04/21/2020 11:09:29 AM Version By: Worthy Keeler PA-C Entered By: Worthy Keeler on 04/21/2020 17:31:29

## 2020-04-21 NOTE — Progress Notes (Signed)
QUINISHA, MOULD ([id number]) Visit Report for 04/21/2020 Arrival Information Details Patient Name: Date of Service: Jeanette, Matthews 04/21/2020 9:45 A M Medical Record Number: [id number] Patient Account Number: [id number] Date of Birth/Sex: Treating RN: [birth year] (86 y.o. [name] Primary Care [name]: Marton Redwood Other Clinician: Referring [name]: Treating [name]/Extender: [name] in Treatment: 1 Visit Information History Since Last Visit Added or deleted any medications: No Patient Arrived: [name] Any new allergies or adverse reactions: No Arrival Time: 09:37 Had a fall or experienced change in No Accompanied By: self activities of daily living that may affect Transfer Assistance: None risk of falls: Patient Identification Verified: Yes Signs or symptoms of abuse/neglect since last visito No Secondary Verification Process Completed: Yes Hospitalized since last visit: No Patient Requires Transmission-Based Precautions: No Implantable device outside of the clinic excluding No Patient Has Alerts: Yes cellular tissue based products placed in the center Patient Alerts: Patient on Blood Thinner since last visit: Has Dressing in Place as Prescribed: Yes Pain Present Now: Yes Electronic Signature(s) Signed: 04/21/2020 10:23:51 AM By: [name] Entered By: [name] on 04/21/2020 09:38:30 -------------------------------------------------------------------------------- Encounter Discharge Information Details Patient Name: Date of Service: [name]. 04/21/2020 9:45 A M Medical Record Number: [id number] Patient Account Number: [id number] Date of Birth/Sex: Treating RN: August 02, 1931 (86 y.o. [name], [name] Primary Care Avanish Cerullo: Marton Redwood Other Clinician: Referring Brinley Rosete: Treating [name]/Extender: [name] in Treatment: 1 Encounter Discharge Information Items Post Procedure  Vitals Discharge Condition: Stable Temperature (F): 98.2 Ambulatory Status: Walker Pulse (bpm): 76 Discharge Destination: Home Respiratory Rate (breaths/min): 18 Transportation: Private Auto Blood Pressure (mmHg): 116/67 Accompanied By: friend Schedule Follow-up Appointment: Yes Clinical Summary of Care: Electronic Signature(s) Signed: 04/21/2020 5:54:43 PM By: [name] Entered By: [name] on 04/21/2020 11:14:16 -------------------------------------------------------------------------------- Lower Extremity Assessment Details Patient Name: Date of Service: Jeanette, Matthews 04/21/2020 9:45 A M Medical Record Number: [id number] Patient Account Number: [id number] Date of Birth/Sex: Treating RN: 1931-12-10 (86 y.o. [name] Primary Care Eloni Darius: Marton Redwood Other Clinician: Referring Othelia Riederer: Treating Paizley Ramella/Extender: [name] in Treatment: 1 Edema Assessment Assessed: [Left: No] [Right: No] Edema: [Left: Ye] [Right: s] Calf Left: Right: Point of Measurement: 32 cm From Medial Instep 31 cm Ankle Left: Right: Point of Measurement: 11 cm From Medial Instep 23 cm Electronic Signature(s) Signed: 04/21/2020 5:53:32 PM By: Jeanette Coria RN Entered By: [name] on 04/21/2020 10:01:40 -------------------------------------------------------------------------------- Pain Assessment Details Patient Name: Date of Service: Jeanette, Matthews 04/21/2020 9:45 A M Medical Record Number: [id number] Patient Account Number: [id number] Date of Birth/Sex: Treating RN: 07-Apr-1932 (86 y.o. [name] Primary Care Raedyn Wenke: Marton Redwood Other Clinician: Referring Neisha Hinger: Treating Timithy Arons/Extender: [name] in Treatment: 1 Active Problems Location of Pain Severity and Description of Pain Patient Has Paino Yes Site Locations Rate the pain. Current Pain Level: 5 Pain Management and Medication Current  Pain Management: Electronic Signature(s) Signed: 04/21/2020 10:23:51 AM By: [name] Signed: 04/21/2020 6:14:29 PM By: Jeanette Gouty RN, BSN Entered By: [name] on 04/21/2020 09:40:00 -------------------------------------------------------------------------------- Wound Assessment Details Patient Name: Date of Service: [name]. 04/21/2020 9:45 A M Medical Record Number: [id number] Patient Account Number: [id number] Date of Birth/Sex: Treating RN: 1932/02/06 (86 y.o. [name] Primary Care Kyndall Amero: Marton Redwood Other Clinician: Referring Jossalyn Forgione: Treating Pollyanna Levay/Extender: [name] in Treatment: 1 Wound Status Wound Number: 1 Primary Diabetic Wound/Ulcer of the Lower  Extremity Etiology: Wound Location: Right, Medial Foot Wound Open Wounding Event: Gradually Appeared Status: Date Acquired: 03/11/2020 Comorbid Congestive Heart Failure, Deep Vein Thrombosis, Hypertension, Weeks Of Treatment: 1 History: Peripheral Arterial Disease, Peripheral Venous Disease, Type II Clustered Wound: No Diabetes, Neuropathy Wound Measurements Length: (cm) 0.8 Width: (cm) 0.7 Depth: (cm) 0.1 Area: (cm) 0.44 Volume: (cm) 0.044 % Reduction in Area: 22.1% % Reduction in Volume: 61.1% Epithelialization: None Tunneling: No Undermining: No Wound Description Classification: Grade 2 Wound Margin: Distinct, outline attached Exudate Amount: Medium Exudate Type: Serosanguineous Exudate Color: red, brown Foul Odor After Cleansing: No Slough/Fibrino Yes Wound Bed Granulation Amount: None Present (0%) Exposed Structure Necrotic Amount: Large (67-100%) Fascia Exposed: No Necrotic Quality: Adherent Slough Fat Layer (Subcutaneous Tissue) Exposed: Yes Tendon Exposed: No Muscle Exposed: No Joint Exposed: No Bone Exposed: No Treatment Notes Wound #1 (Right, Medial Foot) 1. Cleanse With Wound Cleanser 3. Primary Dressing  Applied Santyl 4. Secondary Dressing Dry Gauze Roll Gauze 6. Support Layer Psychologist, forensic 7. Footwear/Offloading device applied Removable [employer] Scientific laboratory technician) Signed: 04/21/2020 5:53:32 PM By: Jeanette Coria RN Signed: 04/21/2020 6:14:29 PM By: Jeanette Gouty RN, BSN Entered By: [name] on 04/21/2020 10:04:13 -------------------------------------------------------------------------------- Wound Assessment Details Patient Name: Date of Service: [name] 04/21/2020 9:45 A M Medical Record Number: [id number] Patient Account Number: [id number] Date of Birth/Sex: Treating RN: Oct 19, 1931 (86 y.o. Martyn Malay, Vaughan Basta Primary Care Roza Creamer: Marton Redwood Other Clinician: Referring Jacelyn Cuen: Treating Eldoris Beiser/Extender: [name] in Treatment: 1 Wound Status Wound Number: 2 Primary Open Surgical Wound Etiology: Wound Location: Right Amputation Site - Transmetatarsal Secondary Arterial Insufficiency Ulcer Wounding Event: Surgical Injury Etiology: Date Acquired: 03/11/2020 Wound Open Weeks Of Treatment: 1 Status: Clustered Wound: No Comorbid Congestive Heart Failure, Deep Vein Thrombosis, Hypertension, History: Peripheral Arterial Disease, Peripheral Venous Disease, Type II Diabetes, Neuropathy Wound Measurements Length: (cm) 5 Width: (cm) 7.5 Depth: (cm) 1.8 Area: (cm) 29.452 Volume: (cm) 53.014 % Reduction in Area: 7.5% % Reduction in Volume: -38.7% Epithelialization: None Tunneling: No Undermining: No Wound Description Classification: Unclassifiable Wound Margin: Epibole Exudate Amount: Large Exudate Type: Serosanguineous Exudate Color: red, brown Foul Odor After Cleansing: No Slough/Fibrino Yes Wound Bed Necrotic Amount: Large (67-100%) Exposed Structure Necrotic Quality: Eschar, Adherent Slough Fascia Exposed: No Fat Layer (Subcutaneous Tissue) Exposed: No Tendon Exposed: No Muscle Exposed:  No Joint Exposed: No Bone Exposed: No Treatment Notes Wound #2 (Right Amputation Site - Transmetatarsal) 1. Cleanse With Wound Cleanser 3. Primary Dressing Applied Santyl 4. Secondary Dressing Dry Gauze Roll Gauze 6. Support Layer Psychologist, forensic 7. Footwear/Offloading device applied Removable [employer] laboratory technician) Signed: 04/21/2020 5:53:32 PM By: Jeanette Coria RN Signed: 04/21/2020 6:14:29 PM By: Jeanette Gouty RN, BSN Entered By: [name] on 04/21/2020 10:05:50 -------------------------------------------------------------------------------- Vitals Details Patient Name: Date of Service: [name]. 04/21/2020 9:45 A M Medical Record Number: [id number] Patient Account Number: [id number] Date of Birth/Sex: Treating RN: Dec 16, 1931 (86 y.o. [name] Primary Care Declin Rajan: Marton Redwood Other Clinician: Referring Sydney Hasten: Treating Beautifull Cisar/Extender: [name] in Treatment: 1 Vital Signs Time Taken: 09:38 Temperature (F): 98.2 Height (in): 68 Pulse (bpm): 76 Weight (lbs): 167 Respiratory Rate (breaths/min): 18 Body Mass Index (BMI): 25.4 Blood Pressure (mmHg): 116/67 Capillary Blood Glucose (mg/dl): 160 Reference Range: 80 - 120 mg / dl Electronic Signature(s) Signed: 04/21/2020 10:23:51 AM By: [name] Entered By: [name] on 04/21/2020 09:39:45

## 2020-04-22 ENCOUNTER — Ambulatory Visit: Payer: Self-pay

## 2020-04-22 DIAGNOSIS — Z8701 Personal history of pneumonia (recurrent): Secondary | ICD-10-CM | POA: Diagnosis not present

## 2020-04-22 DIAGNOSIS — Z794 Long term (current) use of insulin: Secondary | ICD-10-CM | POA: Diagnosis not present

## 2020-04-22 DIAGNOSIS — Z9181 History of falling: Secondary | ICD-10-CM | POA: Diagnosis not present

## 2020-04-22 DIAGNOSIS — Z7902 Long term (current) use of antithrombotics/antiplatelets: Secondary | ICD-10-CM | POA: Diagnosis not present

## 2020-04-22 DIAGNOSIS — J9691 Respiratory failure, unspecified with hypoxia: Secondary | ICD-10-CM | POA: Diagnosis not present

## 2020-04-22 DIAGNOSIS — I5032 Chronic diastolic (congestive) heart failure: Secondary | ICD-10-CM | POA: Diagnosis not present

## 2020-04-22 DIAGNOSIS — Z4781 Encounter for orthopedic aftercare following surgical amputation: Secondary | ICD-10-CM | POA: Diagnosis not present

## 2020-04-22 DIAGNOSIS — D649 Anemia, unspecified: Secondary | ICD-10-CM | POA: Diagnosis not present

## 2020-04-22 DIAGNOSIS — E039 Hypothyroidism, unspecified: Secondary | ICD-10-CM | POA: Diagnosis not present

## 2020-04-22 DIAGNOSIS — I2510 Atherosclerotic heart disease of native coronary artery without angina pectoris: Secondary | ICD-10-CM | POA: Diagnosis not present

## 2020-04-22 DIAGNOSIS — E049 Nontoxic goiter, unspecified: Secondary | ICD-10-CM | POA: Diagnosis not present

## 2020-04-22 DIAGNOSIS — I110 Hypertensive heart disease with heart failure: Secondary | ICD-10-CM | POA: Diagnosis not present

## 2020-04-22 DIAGNOSIS — E782 Mixed hyperlipidemia: Secondary | ICD-10-CM | POA: Diagnosis not present

## 2020-04-22 DIAGNOSIS — E1151 Type 2 diabetes mellitus with diabetic peripheral angiopathy without gangrene: Secondary | ICD-10-CM | POA: Diagnosis not present

## 2020-04-22 DIAGNOSIS — K219 Gastro-esophageal reflux disease without esophagitis: Secondary | ICD-10-CM | POA: Diagnosis not present

## 2020-04-30 DIAGNOSIS — T8131XA Disruption of external operation (surgical) wound, not elsewhere classified, initial encounter: Secondary | ICD-10-CM | POA: Diagnosis not present

## 2020-04-30 DIAGNOSIS — E11621 Type 2 diabetes mellitus with foot ulcer: Secondary | ICD-10-CM | POA: Diagnosis not present

## 2020-05-02 NOTE — Progress Notes (Signed)
Cardiology Office Note   Date:  05/03/2020   ID:  [name], [name] 1931/07/21, MRN [id number]  PCP:  Marton Redwood, MD    No chief complaint on file.  CAD  Wt Readings from Last 3 Encounters:  05/03/20 167 lb 3.2 oz (75.8 kg)  04/08/20 165 lb (74.8 kg)  04/05/20 169 lb (76.7 kg)       History of Present Illness: [name] is a 86 y.o. female   who had a circumflex stent placed in Jan 2014. She had a PTCA of the OM. She has a CTO of the RCA with good collaterals, managed medically.   She is sen by vascular surgery and is status post atherectomy and angioplasty of an 80% ostial left peroneal artery stenosis and drug-eluting balloon angioplasty of tandem 60-70% left superficial femoral artery lesions. This was performed on 04/16/2018 for a wound which has been treated by both Dr. Sharol Given and podiatry. This wound healed. She developed a new wound and a repeat angiogram was performed on 09-03-2018. Stented her left SFA and repeated PTA of her peroneal.  She also has a chest mass which is enlarging thyroid, multinodular. THere is tracheal deviation.No further imaging.    Difficulty controlling her diabetes.SHe has had some low glucoses as well while trying for tighter control.  Her husband went to memory care in 2021.  She has moved to an apartment.  She gets around with a walker.     Right ankle fracture.  Then she had a wound developed. In 8/21, she had surgery to remove two toes from the right foot.  She had volume overload after the procedure and echocardiogram showed ejection fraction of 45 to 50%.  In 9/21, she had additional angiography and PTA of the below knee vessels to help heal the amputation site.   Since the hospital stay, breathing is stable. No orthopnea.   Denies : Chest pain. Dizziness. Nitroglycerin use. Orthopnea. Palpitations. Paroxysmal nocturnal dyspnea. Shortness of breath. Syncope.   She is mostly sedentary.  She is clear that she is  not interested in any procedures.  She is planning on "letting nature take its course."  SHe is frustrated with her inability to do the things that she wants to do.        Past Medical History:  Diagnosis Date  . Anxiety   . CAD (coronary artery disease)   . Cellulitis 10/2015   LEFT FOOT  . CHF (congestive heart failure) ([place])   . Complication of anesthesia   . Coronary artery disease   . Diabetes mellitus without complication (HCC)    insulin dependent  . GERD (gastroesophageal reflux disease)   . Hypertension   . Hypothyroidism   . Neuromuscular disorder (HCC)    muscle cramps to lower extremities  . Other primary cardiomyopathies   . Peripheral vascular disease ([place])   . PONV (postoperative nausea and vomiting)   . Shortness of breath     Past Surgical History:  Procedure Laterality Date  . ABDOMINAL AORTOGRAM N/A 09/03/2018   Procedure: ABDOMINAL AORTOGRAM;  Surgeon: Serafina Mitchell, MD;  Location: [facility];  Service: Cardiovascular;  Laterality: N/A;  . ABDOMINAL AORTOGRAM W/LOWER EXTREMITY N/A 04/16/2018   Procedure: ABDOMINAL AORTOGRAM W/LOWER EXTREMITY;  Surgeon: Serafina Mitchell, MD;  Location: [facility];  Service: Cardiovascular;  Laterality: N/A;  unilateral  . ABDOMINAL AORTOGRAM W/LOWER EXTREMITY Bilateral 04/08/2020   Procedure: ABDOMINAL AORTOGRAM W/LOWER EXTREMITY;  Surgeon: Trula Slade,  Butch Penny, MD;  Location: [facility];  Service: Cardiovascular;  Laterality: Bilateral;  . ABDOMINAL HYSTERECTOMY    . AMPUTATION Left 12/03/2018   Procedure: Left 3rd ray amputation;  Surgeon: Wylene Simmer, MD;  Location: [facility];  Service: Orthopedics;  Laterality: Left;  77min  . CHOLECYSTECTOMY    . CORONARY ANGIOPLASTY WITH STENT PLACEMENT  08/09/2011   DES  to mid circumflex  . I & D EXTREMITY Left 10/29/2015   Procedure: Irrigation and Debridement Left Foot;  Surgeon: Newt Minion, MD;  Location: [facility];  Service: Orthopedics;   Laterality: Left;  . LEFT HEART CATHETERIZATION WITH CORONARY ANGIOGRAM N/A 08/08/2012   Procedure: LEFT HEART CATHETERIZATION WITH CORONARY ANGIOGRAM;  Surgeon: [name], MD;  Location[facility];  Service: Cardiovascular;  Laterality: N/A;  . PERIPHERAL VASCULAR ATHERECTOMY  04/16/2018   Procedure: PERIPHERAL VASCULAR ATHERECTOMY;  Surgeon: Serafina Mitchell, MD;  Location: [place] [facility];  Service: Cardiovascular;;  lt. Peroneal  . PERIPHERAL VASCULAR ATHERECTOMY Right 04/08/2020   Procedure: PERIPHERAL VASCULAR ATHERECTOMY;  Surgeon: Serafina Mitchell, MD;  Location: [place];  Service: Cardiovascular;  Laterality: Right;  SFA and Peroneal  . PERIPHERAL VASCULAR BALLOON ANGIOPLASTY  04/16/2018   Procedure: PERIPHERAL VASCULAR BALLOON ANGIOPLASTY;  Surgeon: Serafina Mitchell, MD;  Location: [place] [facility];  Service: Cardiovascular;;  lt. sfa and AT  . PERIPHERAL VASCULAR BALLOON ANGIOPLASTY Left 09/03/2018   Procedure: PERIPHERAL VASCULAR BALLOON ANGIOPLASTY;  Surgeon: Serafina Mitchell, MD;  Location: [place] [facility];  Service: Cardiovascular;  Laterality: Left;  TP TRUNK  . PERIPHERAL VASCULAR BALLOON ANGIOPLASTY Right 04/08/2020   Procedure: PERIPHERAL VASCULAR BALLOON ANGIOPLASTY;  Surgeon: Serafina Mitchell, MD;  Location: [place] [facility];  Service: Cardiovascular;  Laterality: Right;  SFA (DCB), Peroneal  . PERIPHERAL VASCULAR CATHETERIZATION N/A 12/30/2014   Procedure: Lower Extremity Angiography;  Surgeon: Wellington Hampshire, MD;  Location: [facility] [facility];  Service: Cardiovascular;  Laterality: N/A;  . PERIPHERAL VASCULAR INTERVENTION Left 09/03/2018   Procedure: PERIPHERAL VASCULAR INTERVENTION;  Surgeon: Serafina Mitchell, MD;  Location: [facility] [place] [facility];  Service: Cardiovascular;  Laterality: Left;  SFA STENT   . TENDON RELEASE Right 03/11/2020   Procedure: Heel Cord Lengthening;  Surgeon: Wylene Simmer, MD;  Location: [facility];  Service:  Orthopedics;  Laterality: Right;  . THYROID SURGERY     radioactive iodine   . TONSILLECTOMY    . TRANSMETATARSAL AMPUTATION Right 03/11/2020   Procedure: Right foot transmetatarsal amputation;  Surgeon: Wylene Simmer, MD;  Location: [facility];  Service: Orthopedics;  Laterality: Right;     Current Outpatient Medications  Medication Sig Dispense Refill  . acetaminophen (TYLENOL) 325 MG tablet Take 2 tablets (650 mg total) by mouth every 4 (four) hours as needed for headache or mild pain.    Marland Kitchen amLODipine (NORVASC) 5 MG tablet Take 5 mg by mouth daily.    Marland Kitchen aspirin EC 81 MG EC tablet Take 1 tablet (81 mg total) by mouth daily. Swallow whole. (Patient taking differently: Take 325 mg by mouth at bedtime. Swallow whole.) 30 tablet 11  . atorvastatin (LIPITOR) 20 MG tablet Take 1 tablet (20 mg total) by mouth daily. Please keep upcoming appt in July with Dr. Irish Lack before anymore refills. Thank you (Patient taking differently: Take 20 mg by mouth every evening. ) 90 tablet 0  . b complex vitamins capsule Take 1 capsule  by mouth daily.    . Calcium-Magnesium-Zinc (CAL-MAG-ZINC PO) Take 1 tablet by mouth daily.    . carvedilol (COREG) 25 MG tablet Take 1 tablet (25 mg total) by mouth 2 (two) times daily with a meal. *Please call and schedule an appointment with Dr Fletcher Anon* (Patient taking differently: Take 25 mg by mouth 2 (two) times daily with a meal. ) 60 tablet 0  . cholecalciferol (VITAMIN D) 1000 units tablet Take 2,000 Units by mouth daily.     . clopidogrel (PLAVIX) 75 MG tablet TAKE 1 TABLET BY MOUTH ONCE DAILY (Patient taking differently: Take 75 mg by mouth every evening. ) 30 tablet 11  . Coenzyme Q10 (CO Q 10) 100 MG CAPS Take 300 mg by mouth daily.     . ergocalciferol (VITAMIN D2) 1.25 MG (50000 UT) capsule Take 50,000 Units by mouth once a week.    Marland Kitchen FREESTYLE LITE test strip USE TO CHECK BS TWICE DAILY  AND PRN    . furosemide (LASIX) 40 MG tablet Take 1 tablet (40 mg  total) by mouth daily. (Patient taking differently: Take 40 mg by mouth every evening. ) 30 tablet   . gabapentin (NEURONTIN) 300 MG capsule Take 1 capsule (300 mg total) by mouth 2 (two) times daily.    . insulin aspart (NOVOLOG) 100 UNIT/ML injection Inject 10 Units into the skin 3 (three) times daily with meals. 10 mL 11  . insulin glargine (LANTUS) 100 UNIT/ML injection Inject 0.35 mLs (35 Units total) into the skin every evening. (Patient taking differently: Inject 70 Units into the skin at bedtime. ) 10 mL 11  . insulin lispro (HUMALOG) 100 UNIT/ML injection Inject 10-20 Units into the skin See admin instructions. Take 10 units at breakfast, 20 units at lunch and dinner    . lisinopril (PRINIVIL,ZESTRIL) 20 MG tablet Take 20 mg by mouth every evening.     . nitroGLYCERIN (NITROSTAT) 0.4 MG SL tablet PLACE 1 TABLET UNDER THE TONGUE EVERY 5 MINUTES AS NEEDED FOR CHEST PAIN (Patient taking differently: Place 0.4 mg under the tongue every 5 (five) minutes as needed for chest pain. PLACE 1 TABLET UNDER THE TONGUE EVERY 5 MINUTES AS NEEDED FOR CHEST PAIN) 25 tablet 4  . pantoprazole (PROTONIX) 40 MG tablet TAKE 1 TABLET BY MOUTH ONCE DAILY AT 6 AM (Patient taking differently: Take 40 mg by mouth daily. ) 90 tablet 3  . PARoxetine (PAXIL) 40 MG tablet Take 40 mg by mouth at bedtime.     . polyethylene glycol (MIRALAX / GLYCOLAX) 17 g packet Take 17 g by mouth daily as needed for mild constipation. 14 each 0  . potassium chloride SA (KLOR-CON) 20 MEQ tablet Take 1 tablet (20 mEq total) by mouth daily.     No current facility-administered medications for this visit.    Allergies:   Sulfa antibiotics    Social History:  The patient  reports that she quit smoking about 59 years ago. She has never used smokeless tobacco. She reports current alcohol use. She reports that she does not use drugs.   Family History:  The patient's family history includes Diabetes in her sister; Heart attack in her father  and grandchild; Heart disease in her father and son; Sudden death in her grandchild.    ROS:  Please see the history of present illness.   Otherwise, review of systems are positive for foot problems.   All other systems are reviewed and negative.    PHYSICAL EXAM: VS:  BP 138/80   Pulse (!) 109   Ht 5\' 8"  (1.727 m)   Wt 167 lb 3.2 oz (75.8 kg)   SpO2 98%   BMI 25.42 kg/m  , BMI Body mass index is 25.42 kg/m. GEN: Well nourished, well developed, in no acute distress  HEENT: normal  Neck: no JVD, carotid bruits, or masses Cardiac: RRR; no murmurs, rubs, or gallops,no edema  Respiratory:  clear to auscultation bilaterally, normal work of breathing GI: soft, nontender, nondistended, + BS MS: no deformity or atrophy  Skin: warm and dry, no rash Neuro:  Strength and sensation are intact Psych: euthymic mood, full affect   EKG:   The ekg ordered 03/12/20 demonstrates NSR, T wave inversions, inferior Q waves, QRS widening   Recent Labs: 03/12/2020: B Natriuretic Peptide 1,584.9; TSH 0.892 03/14/2020: Magnesium 1.7 03/15/2020: ALT 176 03/19/2020: Platelets 532 04/08/2020: BUN 30; Creatinine, Ser 0.60; Hemoglobin 10.2; Potassium 4.9; Sodium 140   Lipid Panel    Component Value Date/Time   CHOL 141 12/21/2014 0733   TRIG (H) 12/21/2014 0733    442.0 Triglyceride is over 400; calculations on Lipids are invalid.   HDL 29.10 (L) 12/21/2014 0733   CHOLHDL 5 12/21/2014 0733   VLDL 56.6 (H) 06/22/2014 0737   LDLCALC 24 10/20/2013 0739   LDLDIRECT 46.0 12/21/2014 0733     Other studies Reviewed: Additional studies/ records that were reviewed today with results demonstrating: hospital records reviewed.   ASSESSMENT AND PLAN:  1. CAD: Has been medically managed for several years.  Continue aggressive secondary prevention.  We discussed cath given recent heart failure, but she is clear that she is not interested.  No angina.  Volume status controlled.  As noted above, she wants to let  "nature take its course."She feels her quality of life is quite low due to health issues.  2. Chronic diastolic heart failure: Episode volume overload September.  She was diuresed well.  She was started on medical therapy for her heart failure.  Sent home on oxygen with ambulation.  OK to use extra Lasix when her weight is up, and orthopnea returns.  She is skipping Lasix when she has to go out.  3. PAD: recent amputation in 2021.  Sent to SNF in 2021 after amputation. 4. Hyperlipidemia: CONtinue statin.  5. DM: A1C 8.3 in 8/21.   Current medicines are reviewed at length with the patient today.  The patient concerns regarding her medicines were addressed.  The following changes have been made:  No change  Labs/ tests ordered today include:  No orders of the defined types were placed in this encounter.   Recommend 150 minutes/week of aerobic exercise Low fat, low carb, high fiber diet recommended  Disposition:   FU in 6 months   Signed, Larae Grooms, MD  05/03/2020 10:59 AM    [facility] [address], [place], [place]  [address] Phone: [phone]; Fax: [phone]

## 2020-05-03 ENCOUNTER — Encounter: Payer: Self-pay | Admitting: Interventional Cardiology

## 2020-05-03 ENCOUNTER — Other Ambulatory Visit: Payer: Self-pay

## 2020-05-03 ENCOUNTER — Ambulatory Visit: Payer: HMO | Admitting: Interventional Cardiology

## 2020-05-03 VITALS — BP 138/80 | HR 109 | Ht 68.0 in | Wt 167.2 lb

## 2020-05-03 DIAGNOSIS — I779 Disorder of arteries and arterioles, unspecified: Secondary | ICD-10-CM

## 2020-05-03 DIAGNOSIS — Z794 Long term (current) use of insulin: Secondary | ICD-10-CM | POA: Diagnosis not present

## 2020-05-03 DIAGNOSIS — E1159 Type 2 diabetes mellitus with other circulatory complications: Secondary | ICD-10-CM

## 2020-05-03 DIAGNOSIS — I25119 Atherosclerotic heart disease of native coronary artery with unspecified angina pectoris: Secondary | ICD-10-CM

## 2020-05-03 DIAGNOSIS — E782 Mixed hyperlipidemia: Secondary | ICD-10-CM

## 2020-05-03 MED ORDER — FUROSEMIDE 40 MG PO TABS
40.0000 mg | ORAL_TABLET | Freq: Every day | ORAL | 3 refills | Status: DC
Start: 2020-05-03 — End: 2021-05-03

## 2020-05-03 MED ORDER — POTASSIUM CHLORIDE CRYS ER 20 MEQ PO TBCR: 20.0000 meq | EXTENDED_RELEASE_TABLET | Freq: Every day | ORAL | 3 refills | Status: DC

## 2020-05-03 NOTE — Patient Instructions (Signed)
Medication Instructions:  Your physician recommends that you continue on your current medications as directed. Please refer to the Current Medication list given to you today.  *If you need a refill on your cardiac medications before your next appointment, please call your pharmacy*   Lab Work: Your physician recommends that you continue on your current medications as directed. Please refer to the Current Medication list given to you today.  If you have labs (blood work) drawn today and your tests are completely normal, you will receive your results only by: Marland Kitchen MyChart Message (if you have MyChart) OR . A paper copy in the mail If you have any lab test that is abnormal or we need to change your treatment, we will call you to review the results.   Testing/Procedures: None   Follow-Up: At [facility], you and your health needs are our priority.  As part of our continuing mission to provide you with exceptional heart care, we have created designated Provider Care Teams.  These Care Teams include your primary Cardiologist (physician) and Advanced Practice Providers (APPs -  Physician Assistants and Nurse Practitioners) who all work together to provide you with the care you need, when you need it.  We recommend signing up for the patient portal called "MyChart".  Sign up information is provided on this After Visit Summary.  MyChart is used to connect with patients for Virtual Visits (Telemedicine).  Patients are able to view lab/test results, encounter notes, upcoming appointments, etc.  Non-urgent messages can be sent to your provider as well.   To learn more about what you can do with MyChart, go to [email or web address].    Your next appointment:   6 month(s)  The format for your next appointment:   In Person  Provider:   You may see Larae Grooms, MD or one of the following Advanced Practice Providers on your designated Care Team:    Melina Copa, PA-C  Ermalinda Barrios,  PA-C    Other Instructions None

## 2020-05-04 DIAGNOSIS — T8131XA Disruption of external operation (surgical) wound, not elsewhere classified, initial encounter: Secondary | ICD-10-CM | POA: Diagnosis not present

## 2020-05-04 DIAGNOSIS — E11621 Type 2 diabetes mellitus with foot ulcer: Secondary | ICD-10-CM | POA: Diagnosis not present

## 2020-05-05 ENCOUNTER — Encounter (HOSPITAL_BASED_OUTPATIENT_CLINIC_OR_DEPARTMENT_OTHER): Payer: HMO | Admitting: Physician Assistant

## 2020-05-05 ENCOUNTER — Other Ambulatory Visit: Payer: Self-pay

## 2020-05-05 DIAGNOSIS — Z4789 Encounter for other orthopedic aftercare: Secondary | ICD-10-CM | POA: Diagnosis not present

## 2020-05-05 DIAGNOSIS — T8131XA Disruption of external operation (surgical) wound, not elsewhere classified, initial encounter: Secondary | ICD-10-CM | POA: Diagnosis not present

## 2020-05-05 DIAGNOSIS — E11621 Type 2 diabetes mellitus with foot ulcer: Secondary | ICD-10-CM | POA: Diagnosis not present

## 2020-05-05 NOTE — Progress Notes (Addendum)
Jeanette, Matthews ([id number]) Visit Report for 05/05/2020 Chief Complaint Document Details Patient Name: Date of Service: [name], [name] 05/05/2020 9:00 A M Medical Record Number: [id number] Patient Account Number: [id number] Date of Birth/Sex: Treating RN: 1931/08/10 (86 y.o. [name] Primary Care Provider: Marton Matthews Other Clinician: Referring Provider: Treating Provider/Extender: [name] in Treatment: 3 Information Obtained from: Patient Chief Complaint Right foot ulcers s/p Tarsometatarsal Amputation Electronic Signature(s) Signed: 05/05/2020 9:34:48 AM By: Jeanette Keeler PA-C Entered By: [name] on 05/05/2020 09:34:47 -------------------------------------------------------------------------------- Debridement Details Patient Name: Date of Service: [name]. 05/05/2020 9:00 A M Medical Record Number: [id number] Patient Account Number: [id number] Date of Birth/Sex: Treating RN: 03-09-1932 (86 y.o. [name], [name] Primary Care Provider: Marton Matthews Other Clinician: Referring Provider: Treating Provider/Extender: [name] in Treatment: 3 Debridement Performed for Assessment: Wound #2 Right Amputation Site - Transmetatarsal Performed By: Physician Jeanette Keeler, PA Debridement Type: Debridement Severity of Tissue Pre Debridement: Fat layer exposed Level of Consciousness (Pre-procedure): Awake and Alert Pre-procedure Verification/Time Out Yes - 09:50 Taken: Start Time: 09:50 Pain Control: Other : benzocaine 20% spray T Area Debrided (L x W): otal 4.2 (cm) x 5.5 (cm) = 23.1 (cm) Tissue and other material debrided: Viable, Non-Viable, Slough, Subcutaneous, Slough Level: Skin/Subcutaneous Tissue Debridement Description: Excisional Instrument: Curette Bleeding: Minimum Hemostasis Achieved: Pressure End Time: 09:54 Procedural Pain: 3 Post Procedural Pain: 1 Response to Treatment:  Procedure was tolerated well Level of Consciousness (Post- Awake and Alert procedure): Post Debridement Measurements of Total Wound Length: (cm) 4.2 Width: (cm) 5.5 Depth: (cm) 1 Volume: (cm) 18.143 Character of Wound/Ulcer Post Debridement: Requires Further Debridement Severity of Tissue Post Debridement: Fat layer exposed Post Procedure Diagnosis Same as Pre-procedure Electronic Signature(s) Signed: 05/05/2020 4:35:17 PM By: Jeanette Keeler PA-C Signed: 05/05/2020 4:55:29 PM By: Jeanette Gouty RN, BSN Entered By: [name] on 05/05/2020 09:52:46 -------------------------------------------------------------------------------- HPI Details Patient Name: Date of Service: [name]. 05/05/2020 9:00 A M Medical Record Number: [id number] Patient Account Number: [id number] Date of Birth/Sex: Treating RN: 1931-09-12 (86 y.o. [name] Primary Care Provider: Marton Matthews Other Clinician: Referring Provider: Treating Provider/Extender: [name] in Treatment: 3 History of Present Illness HPI Description: 04/14/2020 upon evaluation today patient appears to be doing somewhat poorly in regard to her foot ulcers. She has a transmetatarsal amputation site which unfortunately has not healed as effectively as would have been hoped for. She also has a small wound on the lateral portion of her foot which seems to be unrelated necessarily to that and very superficial. Nonetheless the amputation site has a tremendous amount of necrotic tissue there is also 9 sutures remaining at this point. Nonetheless I feel like that the patient is going require debridement of this area she was referred to [place] by [name] for evaluation and treatment he has suggested per his note that I reviewed from 03/31/2020 that she may require revision surgery to a below-knee amputation. With that being said obviously the patient wants to avoid this. I do believe that she may be a  candidate for a wound VAC for that reason I am going to perform debridement to remove the sutures as well as necrotic tissue to some degree so that we can get things moving along with regard to the wound VAC as well. Obviously I do not believe that any of the necrotic tissue nor the sutures are really doing much for  her as far as healing is concerned. She does have a history of diabetes mellitus type 2 she also has peripheral vascular disease fortunately she was seen by [name] on 04/08/2020. He did actually reestablish good blood flow to the limb there was a 10 cm segment of the right superficial femoral artery with a 60% stenosis treated with balloon angioplasty. She also did have a subtotal occlusion to the origin of the right peroneal artery treated with arthrectomy and 3 mm balloon angioplasty there was no residual stenosis the patient does have some left renal artery stenosis. The good news is I do believe she has much better blood flow hopefully this will benefit her from the standpoint of healing. The patient's most recent hemoglobin A1c was 8.3. Unfortunately this all started with an ankle fracture in March subsequent to this she had a cast that was placed on her leg and apparently it was when they remove the cast for the 2nd time that they noticed this area underneath on her foot that led to what was thought to be initially just a 1st and 2nd toe amputation but led to a transmetatarsal amputation. That was on 03/11/2020. 04/21/2020 upon evaluation today patient actually appears to be doing quite well in regard to her foot ulcer all things considered. This is still significant wound there is some tendon exposed which is becoming necrotic and will need to be removed but other than this overall I feel like she is actually doing quite well. I do believe that wound VAC will benefit her with that being said we do not have the approval for that she received a box from [facility] but unfortunately is out  of network therefore began to get it through a different company. 05/05/2020 on evaluation today patient actually appears to be doing well with regard to her wound. Fortunately there is no signs of active infection at this time which is great news. She does have some slough buildup also she has got the wound VAC started which is excellent she seems to be doing well with this. Electronic Signature(s) Signed: 05/05/2020 10:28:59 AM By: Jeanette Keeler PA-C Previous Signature: 05/05/2020 9:34:54 AM Version By: Jeanette Keeler PA-C Entered By: [name] on 05/05/2020 10:28:58 -------------------------------------------------------------------------------- Physical Exam Details Patient Name: Date of Service: [name] 05/05/2020 9:00 A M Medical Record Number: [id number] Patient Account Number: [id number] Date of Birth/Sex: Treating RN: February 22, 1932 (86 y.o. [name] Primary Care Provider: Marton Matthews Other Clinician: Referring Provider: Treating Provider/Extender: [name] in Treatment: 3 Constitutional Well-nourished and well-hydrated in no acute distress. Respiratory normal breathing without difficulty. Psychiatric this patient is able to make decisions and demonstrates good insight into disease process. Alert and Oriented x 3. pleasant and cooperative. Notes Upon inspection patient's wound bed actually showed signs of good granulation at this time. There does not appear to be any evidence of active infection which is excellent news and overall I am very pleased. I did perform sharp debridement to clear away some of the slough and necrotic debris from the surface of the wound over the entire surface and she tolerated this today without complication. Post debridement the wound bed appears to be doing much better which is great news. Electronic Signature(s) Signed: 05/05/2020 10:29:44 AM By: Jeanette Keeler PA-C Entered By: [name]  on 05/05/2020 10:29:44 -------------------------------------------------------------------------------- Physician Orders Details Patient Name: Date of Service: [name]. 05/05/2020 9:00 A M Medical Record Number: [id number] Patient  Account Number: [id number] Date of Birth/Sex: Treating RN: 1932-05-22 (86 y.o. [name], Vaughan Basta Primary Care Provider: Marton Matthews Other Clinician: Referring Provider: Treating Provider/Extender: [name] in Treatment: 3 Verbal / Phone Orders: No Diagnosis Coding ICD-10 Coding Code Description E11.621 Type 2 diabetes mellitus with foot ulcer T81.31XA Disruption of external operation (surgical) wound, not elsewhere classified, initial encounter L97.512 Non-pressure chronic ulcer of other part of right foot with fat layer exposed L97.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 I73.89 Other specified peripheral vascular diseases Follow-up Appointments Return Appointment in 2 weeks. Dressing Change Frequency Wound #1 Right,Medial Foot Change dressing three times week. Wound #2 Right Amputation Site - Transmetatarsal Change dressing three times week. Skin Barriers/Peri-Wound Care Skin Prep - under drape Wound Cleansing Wound #1 Right,Medial Foot Clean wound with Normal Saline. May shower with protection. Wound #2 Right Amputation Site - Transmetatarsal Clean wound with Normal Saline. May shower with protection. Primary Wound Dressing Wound #1 Right,Medial Foot Silver Collagen Wound #2 Right Amputation Site - Transmetatarsal Other: - saline moistened gauze in clinic Secondary Dressing Wound #1 Right,Medial Foot Dry Gauze - 2x2 Wound #2 Right Amputation Site - Transmetatarsal Kerlix/Rolled Gauze - in clinic ABD pad - in clinic Negative Presssure Wound Therapy Wound #2 Right Amputation Site - Transmetatarsal Wound Vac to wound continuously at 147mm/hg pressure - home health  to apply Black Foam Other: - may use ace wrap to protect Off-Loading Removable cast walker boot to: - right foot to ambulate Other: - no weight bearing right foot [facility] for wound care. Jackquline [place] Patient Medications llergies: Sulfa (Sulfonamide Antibiotics) A Notifications Medication Indication Start End prior to debridement 05/05/2020 benzocaine DOSE 0 - topical 20 % aerosol - 0 aerosol topical Electronic Signature(s) Signed: 05/05/2020 4:35:17 PM By: Jeanette Keeler PA-C Signed: 05/05/2020 4:55:29 PM By: Jeanette Gouty RN, BSN Entered By: [name] on 05/05/2020 10:06:45 -------------------------------------------------------------------------------- Problem List Details Patient Name: Date of Service: [name]. 05/05/2020 9:00 A M Medical Record Number: [id number] Patient Account Number: [id number] Date of Birth/Sex: Treating RN: September 27, 1931 (86 y.o. [name], [name] Primary Care Provider: Marton Matthews Other Clinician: Referring Provider: Treating Provider/Extender: [name] in Treatment: 3 Active Problems ICD-10 Encounter Code Description Active Date MDM Diagnosis E11.621 Type 2 diabetes mellitus with foot ulcer 04/14/2020 No Yes T81.31XA Disruption of external operation (surgical) wound, not elsewhere classified, 04/14/2020 No Yes initial encounter L97.512 Non-pressure chronic ulcer of other part of right foot with fat layer exposed 04/14/2020 No Yes L97.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 04/21/2020 No Yes I73.89 Other specified peripheral vascular diseases 04/14/2020 No Yes Inactive Problems Resolved Problems Electronic Signature(s) Signed: 05/05/2020 9:34:41 AM By: Jeanette Keeler PA-C Entered By: [name] on 05/05/2020 09:34:41 -------------------------------------------------------------------------------- Progress Note  Details Patient Name: Date of Service: [name]. 05/05/2020 9:00 A M Medical Record Number: [id number] Patient Account Number: [id number] Date of Birth/Sex: Treating RN: 1931/10/27 (86 y.o. [name] Primary Care Provider: Marton Matthews Other Clinician: Referring Provider: Treating Provider/Extender: [name] in Treatment: 3 Subjective Chief Complaint Information obtained from Patient Right foot ulcers s/p Tarsometatarsal Amputation History of Present Illness (HPI) 04/14/2020 upon evaluation today patient appears to be doing somewhat poorly in regard to her foot ulcers. She has a transmetatarsal amputation site which unfortunately has not healed as effectively  as would have been hoped for. She also has a small wound on the lateral portion of her foot which seems to be unrelated necessarily to that and very superficial. Nonetheless the amputation site has a tremendous amount of necrotic tissue there is also 9 sutures remaining at this point. Nonetheless I feel like that the patient is going require debridement of this area she was referred to [place] by [name] for evaluation and treatment he has suggested per his note that I reviewed from 03/31/2020 that she may require revision surgery to a below-knee amputation. With that being said obviously the patient wants to avoid this. I do believe that she may be a candidate for a wound VAC for that reason I am going to perform debridement to remove the sutures as well as necrotic tissue to some degree so that we can get things moving along with regard to the wound VAC as well. Obviously I do not believe that any of the necrotic tissue nor the sutures are really doing much for her as far as healing is concerned. She does have a history of diabetes mellitus type 2 she also has peripheral vascular disease fortunately she was seen by [name] on 04/08/2020. He did actually reestablish good blood flow to the limb  there was a 10 cm segment of the right superficial femoral artery with a 60% stenosis treated with balloon angioplasty. She also did have a subtotal occlusion to the origin of the right peroneal artery treated with arthrectomy and 3 mm balloon angioplasty there was no residual stenosis the patient does have some left renal artery stenosis. The good news is I do believe she has much better blood flow hopefully this will benefit her from the standpoint of healing. The patient's most recent hemoglobin A1c was 8.3. Unfortunately this all started with an ankle fracture in March subsequent to this she had a cast that was placed on her leg and apparently it was when they remove the cast for the 2nd time that they noticed this area underneath on her foot that led to what was thought to be initially just a 1st and 2nd toe amputation but led to a transmetatarsal amputation. That was on 03/11/2020. 04/21/2020 upon evaluation today patient actually appears to be doing quite well in regard to her foot ulcer all things considered. This is still significant wound there is some tendon exposed which is becoming necrotic and will need to be removed but other than this overall I feel like she is actually doing quite well. I do believe that wound VAC will benefit her with that being said we do not have the approval for that she received a box from [facility] but unfortunately is out of network therefore began to get it through a different company. 05/05/2020 on evaluation today patient actually appears to be doing well with regard to her wound. Fortunately there is no signs of active infection at this time which is great news. She does have some slough buildup also she has got the wound VAC started which is excellent she seems to be doing well with this. Objective Constitutional Well-nourished and well-hydrated in no acute distress. Vitals Time Taken: 9:11 AM, Height: 68 in, Weight: 167 lbs, BMI: 25.4, Temperature: 98.0 F,  Pulse: 94 bpm, Respiratory Rate: 18 breaths/min, Blood Pressure: 160/76 mmHg, Capillary Blood Glucose: 200 mg/dl. Respiratory normal breathing without difficulty. Psychiatric this patient is able to make decisions and demonstrates good insight into disease process. Alert and Oriented x 3. pleasant  and cooperative. General Notes: Upon inspection patient's wound bed actually showed signs of good granulation at this time. There does not appear to be any evidence of active infection which is excellent news and overall I am very pleased. I did perform sharp debridement to clear away some of the slough and necrotic debris from the surface of the wound over the entire surface and she tolerated this today without complication. Post debridement the wound bed appears to be doing much better which is great news. Integumentary (Hair, Skin) Wound #1 status is Open. Original cause of wound was Gradually Appeared. The wound is located on the Right,Medial Foot. The wound measures 0.5cm length x 0.5cm width x 0.1cm depth; 0.196cm^2 area and 0.02cm^3 volume. There is Fat Layer (Subcutaneous Tissue) exposed. There is no tunneling or undermining noted. There is a small amount of serosanguineous drainage noted. The wound margin is distinct with the outline attached to the wound base. There is no granulation within the wound bed. There is a large (67-100%) amount of necrotic tissue within the wound bed including Adherent Slough. Wound #2 status is Open. Original cause of wound was Surgical Injury. The wound is located on the Right Amputation Site - Transmetatarsal. The wound measures 4.2cm length x 5.5cm width x 1cm depth; 18.143cm^2 area and 18.143cm^3 volume. There is Fat Layer (Subcutaneous Tissue) exposed. There is no tunneling or undermining noted. There is a large amount of serosanguineous drainage noted. The wound margin is flat and intact. There is medium (34-66%) red, pink granulation within the wound bed.  There is a medium (34-66%) amount of necrotic tissue within the wound bed including Adherent Slough. 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 There was a Excisional Skin/Subcutaneous Tissue Debridement with a total area of 23.1 sq cm performed by Jeanette Keeler, PA. With the following instrument(s): Curette to remove Viable and Non-Viable tissue/material. Material removed includes Subcutaneous Tissue and Slough and after achieving pain control using Other (benzocaine 20% spray). No specimens were taken. A time out was conducted at 09:50, prior to the start of the procedure. A Minimum amount of bleeding was controlled with Pressure. The procedure was tolerated well with a pain level of 3 throughout and a pain level of 1 following the procedure. Post Debridement Measurements: 4.2cm length x 5.5cm width x 1cm depth; 18.143cm^3 volume. Character of Wound/Ulcer Post Debridement requires further debridement. Severity of Tissue Post Debridement is: Fat layer exposed. Post procedure Diagnosis Wound #2: Same as Pre-Procedure Plan Follow-up Appointments: Return Appointment in 2 weeks. Dressing Change Frequency: Wound #1 Right,Medial Foot: Change dressing three times week. Wound #2 Right Amputation Site - Transmetatarsal: Change dressing three times week. Skin Barriers/Peri-Wound Care: Skin Prep - under drape Wound Cleansing: Wound #1 Right,Medial Foot: Clean wound with Normal  Saline. May shower with protection. Wound #2 Right Amputation Site - Transmetatarsal: Clean wound with Normal Saline. May shower with protection. Primary Wound Dressing: Wound #1 Right,Medial Foot: Silver Collagen Wound #2 Right Amputation Site - Transmetatarsal: Other: - saline moistened gauze in clinic Secondary Dressing: Wound #1 Right,Medial  Foot: Dry Gauze - 2x2 Wound #2 Right Amputation Site - Transmetatarsal: Kerlix/Rolled Gauze - in clinic ABD pad - in clinic Negative Presssure Wound Therapy: Wound #2 Right Amputation Site - Transmetatarsal: Wound Vac to wound continuously at 166mm/hg pressure - home health to apply Black Foam Other: - may use ace wrap to protect Off-Loading: Removable cast walker boot to: - right foot to ambulate Other: - no weight bearing right foot Home Health: [facility] skilled nursing for wound care. Jackquline [place] The following medication(s) was prescribed: benzocaine topical 20 % aerosol 0 0 aerosol topical for prior to debridement was prescribed at facility 1. I would recommend that we going continue with the wound care measures as before and the patient is in agreement with the plan with regard to the wound VAC I think this is appropriate. For today we will just put a wet-to-dry saline gauze dressing on the patient since she is actually going to be having a wound VAC replaced later this afternoon. 2. I am also can recommend that we continue with changing of the wound VAC three times a week home health is performing this for [place] which is excellent and I do appreciate their help with the situation. 3. I would also recommend that the patient continue to offload in fact I recommend really not walking at all unless she absolutely has to walk to get somewhere obviously this is the ideal goal. We will see patient back for reevaluation in 1 week here in the clinic. If anything worsens or changes patient will contact our office for  additional recommendations. Electronic Signature(s) Signed: 05/05/2020 10:30:51 AM By: Jeanette Keeler PA-C Entered By: [name] on 05/05/2020 10:30:51 -------------------------------------------------------------------------------- SuperBill Details Patient Name: Date of Service: [name] 05/05/2020 Medical Record Number: [id number] Patient Account Number: [id number] Date of Birth/Sex: Treating RN: Aug 07, 1931 (86 y.o. [name], [name] Primary Care Provider: Marton Matthews Other Clinician: Referring Provider: Treating Provider/Extender: [name] in Treatment: 3 Diagnosis Coding ICD-10 Codes Code Description [phone] Type 2 diabetes mellitus with foot ulcer T81.31XA Disruption of external operation (surgical) wound, not elsewhere classified, initial encounter L97.512 Non-pressure chronic ulcer of other part of right foot with fat layer exposed L97.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 I73.89 Other specified peripheral vascular diseases Facility Procedures CPT4 Code: [id number] Description: 09381 - DEB SUBQ TISSUE 20 SQ CM/< ICD-10 Diagnosis Description L97.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 Modifier: Quantity: 1 CPT4 Code: [id number] Description: 11045 - DEB SUBQ TISS EA ADDL 20CM ICD-10 Diagnosis Description L97.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 Modifier: Quantity: 1 Physician Procedures : CPT4 Code Description Modifier [id number] 10175 - WC PHYS SUBQ TISS 20 SQ CM ICD-10 Diagnosis Description L97.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 [id number] 11045 - WC PHYS SUBQ TISS EA ADDL 20 CM 1 ICD-10  Diagnosis Description L97.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 Quantity: 1 Electronic Signature(s) Signed: 05/05/2020 10:31:05  AM By: Jeanette Keeler PA-C Entered By: [name] on 05/05/2020 10:31:04

## 2020-05-06 NOTE — Progress Notes (Signed)
[name] ([id number]) Visit Report for 05/05/2020 Arrival Information Details Patient Name: Date of Service: [name], [name] 05/05/2020 9:00 A M Medical Record Number: [id number] Patient Account Number: [id number] Date of Birth/Sex: Treating RN: [birth year] (86 y.o. [name] Primary Care Vipul Cafarelli: Marton Redwood Other Clinician: Referring Riel Hirschman: Treating Evin Chirco/Extender: Jarome Matin in Treatment: 3 Visit Information History Since Last Visit Added or deleted any medications: No Patient Arrived: Wheel Chair Any new allergies or adverse reactions: No Arrival Time: 09:10 Had a fall or experienced change in No Accompanied By: friend activities of daily living that may affect Transfer Assistance: None risk of falls: Patient Identification Verified: Yes Signs or symptoms of abuse/neglect since last visito No Secondary Verification Process Completed: Yes Hospitalized since last visit: No Patient Requires Transmission-Based Precautions: No Implantable device outside of the clinic excluding No Patient Has Alerts: Yes cellular tissue based products placed in the center Patient Alerts: Patient on Blood Thinner since last visit: Has Dressing in Place as Prescribed: Yes Pain Present Now: No Electronic Signature(s) Signed: 05/06/2020 2:18:08 PM By: Sandre Kitty Entered By: Sandre Kitty on 05/05/2020 09:11:05 -------------------------------------------------------------------------------- Encounter Discharge Information Details Patient Name: Date of Service: [name]. 05/05/2020 9:00 A M Medical Record Number: [id number] Patient Account Number: [id number] Date of Birth/Sex: Treating RN: 1931/09/21 (86 y.o. [name] Primary Care Annie Roseboom: Marton Redwood Other Clinician: Referring Delyle Weider: Treating Kindra Bickham/Extender: Jarome Matin in Treatment: 3 Encounter Discharge Information Items Post Procedure  Vitals Discharge Condition: Stable Temperature (F): 98 Ambulatory Status: Walker Pulse (bpm): 94 Discharge Destination: Home Respiratory Rate (breaths/min): 18 Transportation: Private Auto Blood Pressure (mmHg): 160/76 Accompanied By: friend Schedule Follow-up Appointment: Yes Clinical Summary of Care: Patient Declined Electronic Signature(s) Signed: 05/05/2020 4:37:14 PM By: Kela Millin Entered By: Kela Millin on 05/05/2020 10:15:17 -------------------------------------------------------------------------------- Lower Extremity Assessment Details Patient Name: Date of Service: [name]. 05/05/2020 9:00 A M Medical Record Number: [id number] Patient Account Number: [id number] Date of Birth/Sex: Treating RN: Jul 11, 1932 (86 y.o. [name] Primary Care Mercy Malena: Marton Redwood Other Clinician: Referring Imogene Gravelle: Treating Caidance Sybert/Extender: Jarome Matin in Treatment: 3 Edema Assessment Assessed: Shirlyn Goltz: No] Patrice Paradise: No] Edema: [Left: Ye] [Right: s] Calf Left: Right: Point of Measurement: 32 cm From Medial Instep 31 cm Ankle Left: Right: Point of Measurement: 11 cm From Medial Instep 23 cm Vascular Assessment Pulses: Dorsalis Pedis Palpable: [Right:Yes] Electronic Signature(s) Signed: 05/06/2020 5:24:31 PM By: Levan Hurst RN, BSN Entered By: Levan Hurst on 05/05/2020 09:23:41 -------------------------------------------------------------------------------- [place] Details Patient Name: Date of Service: [name]. 05/05/2020 9:00 A M Medical Record Number: [id number] Patient Account Number: [id number] Date of Birth/Sex: Treating RN: 1932-02-24 (86 y.o. [name] Primary Care Rilyn Scroggs: Marton Redwood Other Clinician: Referring Quincy Boy: Treating Nazeer Romney/Extender: Jarome Matin in Treatment: 3 Active Inactive Nutrition Nursing Diagnoses: Impaired glucose  control: actual or potential Potential for alteratiion in Nutrition/Potential for imbalanced nutrition Goals: Patient/caregiver will maintain therapeutic glucose control Date Initiated: 04/14/2020 Target Resolution Date: 05/12/2020 Goal Status: Active Interventions: Assess HgA1c results as ordered upon admission and as needed Assess patient nutrition upon admission and as needed per policy Treatment Activities: Patient referred to Primary Care Physician for further nutritional evaluation : 04/14/2020 Notes: Tissue Oxygenation Nursing Diagnoses: Actual ineffective tissue perfusion; peripheral (select once diagnosis is confirmed) Knowledge deficit related to disease process and management Goals: Patient/caregiver will verbalize understanding of disease process and disease management Date Initiated: 04/14/2020 Target  Resolution Date: 05/12/2020 Goal Status: Active Interventions: Assess patient understanding of disease process and management upon diagnosis and as needed Assess peripheral arterial status upon admission and as needed Provide education on tissue oxygenation and ischemia Notes: Wound/Skin Impairment Nursing Diagnoses: Impaired tissue integrity Knowledge deficit related to ulceration/compromised skin integrity Goals: Patient/caregiver will verbalize understanding of skin care regimen Date Initiated: 04/14/2020 Target Resolution Date: 05/12/2020 Goal Status: Active Ulcer/skin breakdown will have a volume reduction of 30% by week 4 Date Initiated: 04/14/2020 Target Resolution Date: 05/12/2020 Goal Status: Active Interventions: Assess patient/caregiver ability to obtain necessary supplies Assess patient/caregiver ability to perform ulcer/skin care regimen upon admission and as needed Assess ulceration(s) every visit Provide education on ulcer and skin care Treatment Activities: Skin care regimen initiated : 04/14/2020 Topical wound management initiated :  04/14/2020 Notes: Electronic Signature(s) Signed: 05/05/2020 4:55:29 PM By: Baruch Gouty RN, BSN Entered By: Baruch Gouty on 05/05/2020 09:48:01 -------------------------------------------------------------------------------- Pain Assessment Details Patient Name: Date of Service: [name]. 05/05/2020 9:00 A M Medical Record Number: [id number] Patient Account Number: [id number] Date of Birth/Sex: Treating RN: [birth year] (86 y.o. [name] Primary Care Eion Timbrook: Marton Redwood Other Clinician: Referring Channel Papandrea: Treating Ziomara Birenbaum/Extender: Jarome Matin in Treatment: 3 Active Problems Location of Pain Severity and Description of Pain Patient Has Paino No Site Locations Pain Management and Medication Current Pain Management: Electronic Signature(s) Signed: 05/05/2020 4:55:29 PM By: Baruch Gouty RN, BSN Signed: 05/06/2020 2:18:08 PM By: Sandre Kitty Entered By: Sandre Kitty on 05/05/2020 09:11:54 -------------------------------------------------------------------------------- Patient/Caregiver Education Details Patient Name: Date of Service: [name] 10/20/2021andnbsp9:00 A M Medical Record Number: [id number] Patient Account Number: [id number] Date of Birth/Gender: Treating RN: 04/14/1932 (86 y.o. [name] Primary Care Physician: Marton Redwood Other Clinician: Referring Physician: Treating Physician/Extender: Jarome Matin in Treatment: 3 Education Assessment Education Provided To: Patient Education Topics Provided Tissue Oxygenation: Methods: Explain/Verbal Responses: Reinforcements needed, State content correctly Wound/Skin Impairment: Methods: Explain/Verbal Responses: Reinforcements needed, State content correctly Electronic Signature(s) Signed: 05/05/2020 4:55:29 PM By: Baruch Gouty RN, BSN Entered By: Baruch Gouty on 05/05/2020  09:49:35 -------------------------------------------------------------------------------- Wound Assessment Details Patient Name: Date of Service: [name]. 05/05/2020 9:00 A M Medical Record Number: [id number] Patient Account Number: [id number] Date of Birth/Sex: Treating RN: 02/03/1932 (86 y.o. Martyn Malay, Linda Primary Care Zilda No: Marton Redwood Other Clinician: Referring Massiah Longanecker: Treating Pam Vanalstine/Extender: Jarome Matin in Treatment: 3 Wound Status Wound Number: 1 Primary Diabetic Wound/Ulcer of the Lower Extremity Etiology: Wound Location: Right, Medial Foot Wound Open Wounding Event: Gradually Appeared Status: Date Acquired: 03/11/2020 Comorbid Congestive Heart Failure, Deep Vein Thrombosis, Hypertension, Weeks Of Treatment: 3 History: Peripheral Arterial Disease, Peripheral Venous Disease, Type II Clustered Wound: No Diabetes, Neuropathy Wound Measurements Length: (cm) 0.5 Width: (cm) 0.5 Depth: (cm) 0.1 Area: (cm) 0.196 Volume: (cm) 0.02 % Reduction in Area: 65.3% % Reduction in Volume: 82.3% Epithelialization: Small (1-33%) Tunneling: No Undermining: No Wound Description Classification: Grade 2 Wound Margin: Distinct, outline attached Exudate Amount: Small Exudate Type: Serosanguineous Exudate Color: red, brown Foul Odor After Cleansing: No Slough/Fibrino Yes Wound Bed Granulation Amount: None Present (0%) Exposed Structure Necrotic Amount: Large (67-100%) Fascia Exposed: No Necrotic Quality: Adherent Slough Fat Layer (Subcutaneous Tissue) Exposed: Yes Tendon Exposed: No Muscle Exposed: No Joint Exposed: No Bone Exposed: No Treatment Notes Wound #1 (Right, Medial Foot) 1. Cleanse With Wound Cleanser 2. Periwound Care Skin Prep 3. Primary Dressing Applied Colle[employer] 4. Secondary Dressing Dry Gauze Roll  Gauze 5. Secured With Tape Notes Horticulturist, commercial) Signed: 05/05/2020 4:55:29 PM By:  Baruch Gouty RN, BSN Signed: 05/06/2020 5:24:31 PM By: Levan Hurst RN, BSN Entered By: Levan Hurst on 05/05/2020 09:27:42 -------------------------------------------------------------------------------- Wound Assessment Details Patient Name: Date of Service: [name]. 05/05/2020 9:00 A M Medical Record Number: [id number] Patient Account Number: [id number] Date of Birth/Sex: Treating RN: Dec 02, 1931 (86 y.o. Martyn Malay, Linda Primary Care Maila Dukes: Marton Redwood Other Clinician: Referring Ingri Diemer: Treating Rosemary Mossbarger/Extender: Jarome Matin in Treatment: 3 Wound Status Wound Number: 2 Primary Open Surgical Wound Etiology: Wound Location: Right Amputation Site - Transmetatarsal Secondary Arterial Insufficiency Ulcer Wounding Event: Surgical Injury Etiology: Date Acquired: 03/11/2020 Wound Open Weeks Of Treatment: 3 Status: Clustered Wound: No Comorbid Congestive Heart Failure, Deep Vein Thrombosis, Hypertension, History: Peripheral Arterial Disease, Peripheral Venous Disease, Type II Diabetes, Neuropathy Wound Measurements Length: (cm) 4.2 Width: (cm) 5.5 Depth: (cm) 1 Area: (cm) 18.143 Volume: (cm) 18.143 % Reduction in Area: 43% % Reduction in Volume: 52.5% Epithelialization: Small (1-33%) Tunneling: No Undermining: No Wound Description Classification: Full Thickness Without Exposed Support Structures Wound Margin: Flat and Intact Exudate Amount: Large Exudate Type: Serosanguineous Exudate Color: red, brown Foul Odor After Cleansing: No Slough/Fibrino Yes Wound Bed Granulation Amount: Medium (34-66%) Exposed Structure Granulation Quality: Red, Pink Fascia Exposed: No Necrotic Amount: Medium (34-66%) Fat Layer (Subcutaneous Tissue) Exposed: Yes Necrotic Quality: Adherent Slough Tendon Exposed: No Muscle Exposed: No Joint Exposed: No Bone Exposed: No Treatment Notes Wound #2 (Right Amputation Site - Transmetatarsal) 1.  Cleanse With Wound Cleanser 2. Periwound Care Skin Prep 3. Primary Dressing Applied Other primary dressing (specifiy in notes) 4. Secondary Dressing ABD Pad Dry Gauze Roll Gauze 5. Secured With Tape Notes wet to dry dressing. HH to re-apply vac. netting Electronic Signature(s) Signed: 05/05/2020 4:55:29 PM By: Baruch Gouty RN, BSN Signed: 05/06/2020 5:24:31 PM By: Levan Hurst RN, BSN Entered By: Levan Hurst on 05/05/2020 14:38:88 -------------------------------------------------------------------------------- [place] Details Patient Name: Date of Service: [name]. 05/05/2020 9:00 A M Medical Record Number: [id number] Patient Account Number: [id number] Date of Birth/Sex: Treating RN: 20-Jan-1932 (86 y.o. [name] Primary Care Shanequa Whitenight: Marton Redwood Other Clinician: Referring Amylee Lodato: Treating Zelia Yzaguirre/Extender: Jarome Matin in Treatment: 3 Vital Signs Time Taken: 09:11 Temperature (F): 98.0 Height (in): 68 Pulse (bpm): 94 Weight (lbs): 167 Respiratory Rate (breaths/min): 18 Body Mass Index (BMI): 25.4 Blood Pressure (mmHg): 160/76 Capillary Blood Glucose (mg/dl): 200 Reference Range: 80 - 120 mg / dl Electronic Signature(s) Signed: 05/06/2020 2:18:08 PM By: Sandre Kitty Entered By: Sandre Kitty on 05/05/2020 09:11:49

## 2020-05-10 ENCOUNTER — Encounter: Payer: Self-pay | Admitting: Surgery

## 2020-05-10 ENCOUNTER — Ambulatory Visit (INDEPENDENT_AMBULATORY_CARE_PROVIDER_SITE_OTHER)
Admission: RE | Admit: 2020-05-10 | Discharge: 2020-05-10 | Disposition: A | Discharge status: Home / Self Care | Payer: HMO | Source: Ambulatory Visit | Attending: Surgery | Admitting: Surgery

## 2020-05-10 ENCOUNTER — Other Ambulatory Visit: Payer: Self-pay

## 2020-05-10 ENCOUNTER — Ambulatory Visit (HOSPITAL_COMMUNITY)
Admission: RE | Admit: 2020-05-10 | Discharge: 2020-05-10 | Disposition: A | Discharge status: Home / Self Care | Payer: HMO | Source: Ambulatory Visit | Attending: Surgery | Admitting: Surgery

## 2020-05-10 ENCOUNTER — Ambulatory Visit (INDEPENDENT_AMBULATORY_CARE_PROVIDER_SITE_OTHER): Payer: HMO | Admitting: Surgery

## 2020-05-10 VITALS — BP 139/83 | HR 100 | Temp 97.7°F | Resp 20 | Ht 68.0 in | Wt 165.0 lb

## 2020-05-10 DIAGNOSIS — I779 Disorder of arteries and arterioles, unspecified: Secondary | ICD-10-CM | POA: Diagnosis not present

## 2020-05-10 DIAGNOSIS — E08621 Diabetes mellitus due to underlying condition with foot ulcer: Secondary | ICD-10-CM

## 2020-05-10 DIAGNOSIS — L97401 Non-pressure chronic ulcer of unspecified heel and midfoot limited to breakdown of skin: Secondary | ICD-10-CM | POA: Diagnosis not present

## 2020-05-10 NOTE — Progress Notes (Signed)
[facility] [place]  Patient name: [name] MRN: [id number] DOB: 1932-02-07 Sex: female   REASON FOR VISIT:    Follow up  [place]:    [name] a 86 y.o.femalewho is status post atherectomy and angioplasty of an 80% ostial left peroneal artery stenosis and drug-eluting balloon angioplasty of tandem 60-70% left superficial femoral artery lesions. This was performed on 04/16/2018 for a wound which has been treated by both Dr. Sharol Given and podiatry. This wound healed. She developed a new wound and a repeat angiogram was performed on 09-03-2018. I stented her left SFA and repeated PTA of her peroneal. She is status post left 3d ray amputation (12-03-2019) and right TMA on 03-11-2020 by Dr. Doran Durand for diabetic ulcers.  She is having difficulty healing the right side.  The left side is intact and healed.  I then on 04/08/2020 repeated angiography.  She had a 10 cm segment of the right superficial femoral artery with a 60% stenosis that was treated with orbital atherectomy and 5 mm drug-coated balloon.  She also had a subtotal occlusion of the origin of the right peroneal artery that was also treated with orbital atherectomy and 3 mm balloon angioplasty  She is now seeing the wound center and having her vac changed Monday, Wednesday and friday    [name] suffers from coronary artery disease. Shehas undergone PCI in the past. Sheis a diabetic. She is on statin for hypercholesterolemia and takes an ACE inhibitor for hypertension.  PAST MEDICAL HISTORY:   Past Medical History:  Diagnosis Date  . Anxiety   . CAD (coronary artery disease)   . Cellulitis 10/2015   LEFT FOOT  . CHF (congestive heart failure) ([place])   . Complication of anesthesia   . Coronary artery disease   . Diabetes mellitus without complication (HCC)    insulin dependent  . GERD (gastroesophageal reflux disease)   . Hypertension    . Hypothyroidism   . Neuromuscular disorder (HCC)    muscle cramps to lower extremities  . Other primary cardiomyopathies   . Peripheral vascular disease ([place])   . PONV (postoperative nausea and vomiting)   . Shortness of breath      FAMILY HISTORY:   Family History  Problem Relation Age of Onset  . Heart disease Father   . Heart attack Father   . Diabetes Sister   . Heart disease Son        before age 91  . Heart attack 24        84yr old  . Sudden death Grandchild   . Hypertension Neg Hx     SOCIAL HISTORY:   Social History   Tobacco Use  . Smoking status: Former Smoker    Quit date: 11/28/1960    Years since quitting: 59.4  . Smokeless tobacco: Never Used  Substance Use Topics  . Alcohol use: Yes    Alcohol/week: 0.0 standard drinks    Comment: rare     ALLERGIES:   Allergies  Allergen Reactions  . Sulfa Antibiotics Nausea And Vomiting    Severe vomiting.      CURRENT MEDICATIONS:   Current Outpatient Medications  Medication Sig Dispense Refill  . acetaminophen (TYLENOL) 325 MG tablet Take 2 tablets (650 mg total) by mouth every 4 (four) hours as needed for headache or mild pain.    Marland Kitchen amLODipine (NORVASC) 5 MG tablet Take 5 mg by mouth daily.    Marland Kitchen aspirin EC  81 MG EC tablet Take 1 tablet (81 mg total) by mouth daily. Swallow whole. (Patient taking differently: Take 325 mg by mouth at bedtime. Swallow whole.) 30 tablet 11  . atorvastatin (LIPITOR) 20 MG tablet Take 1 tablet (20 mg total) by mouth daily. Please keep upcoming appt in July with Dr. Irish Lack before anymore refills. Thank you (Patient taking differently: Take 20 mg by mouth every evening. ) 90 tablet 0  . b complex vitamins capsule Take 1 capsule by mouth daily.    . Calcium-Magnesium-Zinc (CAL-MAG-ZINC PO) Take 1 tablet by mouth daily.    . carvedilol (COREG) 25 MG tablet Take 1 tablet (25 mg total) by mouth 2 (two) times daily with a meal. *Please call and schedule an appointment  with Dr Fletcher Anon* (Patient taking differently: Take 25 mg by mouth 2 (two) times daily with a meal. ) 60 tablet 0  . cholecalciferol (VITAMIN D) 1000 units tablet Take 2,000 Units by mouth daily.     . clopidogrel (PLAVIX) 75 MG tablet TAKE 1 TABLET BY MOUTH ONCE DAILY (Patient taking differently: Take 75 mg by mouth every evening. ) 30 tablet 11  . Coenzyme Q10 (CO Q 10) 100 MG CAPS Take 300 mg by mouth daily.     . ergocalciferol (VITAMIN D2) 1.25 MG (50000 UT) capsule Take 50,000 Units by mouth once a week.    Marland Kitchen FREESTYLE LITE test strip USE TO CHECK BS TWICE DAILY  AND PRN    . furosemide (LASIX) 40 MG tablet Take 1 tablet (40 mg total) by mouth daily. 90 tablet 3  . gabapentin (NEURONTIN) 300 MG capsule Take 1 capsule (300 mg total) by mouth 2 (two) times daily.    . insulin aspart (NOVOLOG) 100 UNIT/ML injection Inject 10 Units into the skin 3 (three) times daily with meals. 10 mL 11  . insulin glargine (LANTUS) 100 UNIT/ML injection Inject 0.35 mLs (35 Units total) into the skin every evening. (Patient taking differently: Inject 70 Units into the skin at bedtime. ) 10 mL 11  . insulin lispro (HUMALOG) 100 UNIT/ML injection Inject 10-20 Units into the skin See admin instructions. Take 10 units at breakfast, 20 units at lunch and dinner    . lisinopril (PRINIVIL,ZESTRIL) 20 MG tablet Take 20 mg by mouth every evening.     . nitroGLYCERIN (NITROSTAT) 0.4 MG SL tablet PLACE 1 TABLET UNDER THE TONGUE EVERY 5 MINUTES AS NEEDED FOR CHEST PAIN (Patient taking differently: Place 0.4 mg under the tongue every 5 (five) minutes as needed for chest pain. PLACE 1 TABLET UNDER THE TONGUE EVERY 5 MINUTES AS NEEDED FOR CHEST PAIN) 25 tablet 4  . pantoprazole (PROTONIX) 40 MG tablet TAKE 1 TABLET BY MOUTH ONCE DAILY AT 6 AM (Patient taking differently: Take 40 mg by mouth daily. ) 90 tablet 3  . PARoxetine (PAXIL) 40 MG tablet Take 40 mg by mouth at bedtime.     . polyethylene glycol (MIRALAX / GLYCOLAX) 17 g  packet Take 17 g by mouth daily as needed for mild constipation. 14 each 0  . potassium chloride SA (KLOR-CON) 20 MEQ tablet Take 1 tablet (20 mEq total) by mouth daily. 90 tablet 3   No current facility-administered medications for this visit.    REVIEW OF SYSTEMS:   [X]  denotes positive finding, [ ]  denotes negative finding Cardiac  Comments:  Chest pain or chest pressure:    Shortness of breath upon exertion:    Short of breath when lying flat:  Irregular heart rhythm:        Vascular    Pain in calf, thigh, or hip brought on by ambulation:    Pain in feet at night that wakes you up from your sleep:     Blood clot in your veins:    Leg swelling:         Pulmonary    Oxygen at home:    Productive cough:     Wheezing:         Neurologic    Sudden weakness in arms or legs:     Sudden numbness in arms or legs:     Sudden onset of difficulty speaking or slurred speech:    Temporary loss of vision in one eye:     Problems with dizziness:         Gastrointestinal    Blood in stool:     Vomited blood:         Genitourinary    Burning when urinating:     Blood in urine:        Psychiatric    Major depression:         Hematologic    Bleeding problems:    Problems with blood clotting too easily:        Skin    Rashes or ulcers: x       Constitutional    Fever or chills:      PHYSICAL EXAM:   Vitals:   05/10/20 1209  BP: 139/83  Pulse: 100  Resp: 20  Temp: 97.7 F (36.5 C)  SpO2: 96%  Weight: 165 lb (74.8 kg)  Height: 5\' 8"  (1.727 m)    GENERAL: The patient is a well-nourished female, in no acute distress. The vital signs are documented above. CARDIAC: There is a regular rate and rhythm.  PULMONARY: Non-labored respirations MUSCULOSKELETAL: There are no major deformities or cyanosis. NEUROLOGIC: No focal weakness or paresthesias are detected. SKIN: wound vac in place PSYCHIATRIC: The patient has a normal affect.  STUDIES:   I have reviewed the  following studies:   ABI/TBIToday's ABIToday's TBIPrevious ABIPrevious TBI  +-------+-----------+-----------+------------+------------+  Right [place]     amputation 0.60    amputation   +-------+-----------+-----------+------------+------------+  Left  1.03    .93    0.95    0.77      +-------+-----------+-----------+------------+------------+  Left pressure: 134  Right: Patent right lower extremity with no visualized stenosis, probable  distal femoral to poplitel arterial occlusive disease, limited  visualization.   MEDICAL ISSUES:   Follow up in 3 months with repeat ABI and LE duplex    Leia Alf, MD, FACS [facility] [phone] Pager [phone]

## 2020-05-11 ENCOUNTER — Other Ambulatory Visit: Payer: Self-pay

## 2020-05-11 NOTE — Patient Outreach (Signed)
  [facility]) Care Management Chronic Special Needs Program    05/11/2020  Name: Nasra, Counce: 1931/08/12  MRN: [id number]   Ms. Matsuko Kretz is enrolled in a chronic special needs plan for Diabetes. RNCM called to follow up and  Update care plan. No answer. HIPAA compliant message left.   Plan: Chronic care management coordinator will attempt outreach within 1-2 weeks.  Thea Silversmith, RN, MSN, [place] [phone]

## 2020-05-11 NOTE — Patient Outreach (Addendum)
[name]) Care Management Chronic Special Needs Program  05/11/2020  Name: [name] DOB: 02/02/1932  MRN: [id number]  [name] is enrolled in a chronic special needs plan for Diabetes. RNCM received return call from client.   Subjective: client reports, "everything seems good". She has a wound vac to right foot and states, "It is helping". "The wound is getting smaller". She states wound clinic visits every two weeks and home health with [name] on Monday, Wednesday and Friday to change the wound vac dressing. She has someone to help her to clean the house twice a week. Client expresses some frustration, with having to stay off her feet, but reports understanding of her limitations due to wound vac. Diagnosis of acute heart failure on 03/12/20 admission: Client reports attended follow up visit with cardiologist and states, "my heart is ok" and without signs/symptoms of exacerbation. Client reports an upcoming appointment with primary care provider next month. She reports she has not checked her blood sugar today, but reports blood sugars usually in the 100's and yesterday was 150. Client reports no difficulty with medication management and reports she has transportation to provider visits. Client reports visit with [facility] completed this month.  Goals Addressed            This Visit's Progress   . COMPLETED: Client will report no worsening of symptoms related to heart disease within the next 6 months       Reports no worsening of symptoms related to heart disease.    . Client will verbalize knowledge of self management of Hypertension as evidences by BP reading of 140/90 or less; or as defined by provider   On track    Blood pressure 03/09/20 136/60  Continue blood pressure self management strategies: Follow up with your doctor as scheduled. Take your medications as prescribed by your doctor. Ask your doctor "what is my target blood pressure  range". Monitor your blood pressure and take results to your doctor's appointment.  Monitor the amount of salt you are eating. Continue to exercise as tolerated and remain active. Eat heart healthy diet (full of fruits, vegetables, whole grains, lean protein, water--limit salt, fat, and sugar/simple carb intake).     . Client will verbalize understanding of treatment plan for impaired skin integrity and follow up with provider within the next 3 months.   On track    Discussed wound care management. Continue to follow up with the wound care center as recommended. Continue to observe for signs/symptoms of infection: redness, increased pain; increased swelling or drainage pus or cloudy fluid draining from the wound.    . Decrease inpatient admissions/ readmissions with in the next year   On track    Continue to participate with Home health therapy as recommended. Continue to attend provider visits as scheduled. Continue to take your medications as prescribed. Please continue to follow up with [name] as scheduled and call them as needed.      . COMPLETED: General - Client will not be readmitted within 30 days ([facility]) discharged 04/02/2020       No readmission      . Obtain annual  Lipid Profile, LDL-C   On track    Continue to follow up with your provider as scheduled for recommended labs. Try to avoid saturated fats, trans-fats and eat more fiber.     . COMPLETED: Obtain Annual Foot Exam       Completed 10/21/2019    . Obtain  annual screen for micro albuminuria (urine) , nephropathy (kidney problems)   On track    Reports and upcoming visit with primary care provider next month. It is important to follow up with your provider as scheduled for recommended labs. This test looks at how your kidneys are working.    . Obtain Hemoglobin A1C at least 2 times per year   On track    A1C 8.3 on 03/13/20.  Discussed blood sugar readings.  Diabetes self management actions:  Glucose  monitoring per provider recommendations  Eat Healthy: plan to eat low carbohydrate and low salt meals, watch portion sizes and avoid sugar sweetened drinks.  Continue to visit provider every 3-6 months as directed by provider  Hbg A1C level every 3-6 months as directed by provider.     . Visit Primary Care Provider or Endocrinologist at least 2 times per year    On track    Continue to attend provider visits as recommended.      Plan: send updated care plan to client, send updated care plan to primary care provider. Continue care coordination with [name]. [name] will outreach in 6 months per tier level. [name] Case Management Team will follow member moving forward with assessments, care plans and documentation.   Jeanette Silversmith, RN, MSN, [facility] [phone]

## 2020-05-12 ENCOUNTER — Other Ambulatory Visit: Payer: Self-pay

## 2020-05-12 ENCOUNTER — Other Ambulatory Visit: Payer: Self-pay | Admitting: Surgery

## 2020-05-12 DIAGNOSIS — I5032 Chronic diastolic (congestive) heart failure: Secondary | ICD-10-CM | POA: Diagnosis not present

## 2020-05-12 DIAGNOSIS — E039 Hypothyroidism, unspecified: Secondary | ICD-10-CM | POA: Diagnosis not present

## 2020-05-12 DIAGNOSIS — I779 Disorder of arteries and arterioles, unspecified: Secondary | ICD-10-CM

## 2020-05-12 DIAGNOSIS — J9691 Respiratory failure, unspecified with hypoxia: Secondary | ICD-10-CM | POA: Diagnosis not present

## 2020-05-12 DIAGNOSIS — Z7902 Long term (current) use of antithrombotics/antiplatelets: Secondary | ICD-10-CM | POA: Diagnosis not present

## 2020-05-12 DIAGNOSIS — I2510 Atherosclerotic heart disease of native coronary artery without angina pectoris: Secondary | ICD-10-CM | POA: Diagnosis not present

## 2020-05-12 DIAGNOSIS — E1151 Type 2 diabetes mellitus with diabetic peripheral angiopathy without gangrene: Secondary | ICD-10-CM | POA: Diagnosis not present

## 2020-05-12 DIAGNOSIS — E049 Nontoxic goiter, unspecified: Secondary | ICD-10-CM | POA: Diagnosis not present

## 2020-05-12 DIAGNOSIS — D649 Anemia, unspecified: Secondary | ICD-10-CM | POA: Diagnosis not present

## 2020-05-12 DIAGNOSIS — Z794 Long term (current) use of insulin: Secondary | ICD-10-CM | POA: Diagnosis not present

## 2020-05-12 DIAGNOSIS — Z9181 History of falling: Secondary | ICD-10-CM | POA: Diagnosis not present

## 2020-05-12 DIAGNOSIS — E782 Mixed hyperlipidemia: Secondary | ICD-10-CM | POA: Diagnosis not present

## 2020-05-12 DIAGNOSIS — Z8701 Personal history of pneumonia (recurrent): Secondary | ICD-10-CM | POA: Diagnosis not present

## 2020-05-12 DIAGNOSIS — I110 Hypertensive heart disease with heart failure: Secondary | ICD-10-CM | POA: Diagnosis not present

## 2020-05-12 DIAGNOSIS — Z4781 Encounter for orthopedic aftercare following surgical amputation: Secondary | ICD-10-CM | POA: Diagnosis not present

## 2020-05-12 DIAGNOSIS — K219 Gastro-esophageal reflux disease without esophagitis: Secondary | ICD-10-CM | POA: Diagnosis not present

## 2020-05-15 DIAGNOSIS — E11621 Type 2 diabetes mellitus with foot ulcer: Secondary | ICD-10-CM | POA: Diagnosis not present

## 2020-05-15 DIAGNOSIS — T8131XA Disruption of external operation (surgical) wound, not elsewhere classified, initial encounter: Secondary | ICD-10-CM | POA: Diagnosis not present

## 2020-05-17 DIAGNOSIS — K219 Gastro-esophageal reflux disease without esophagitis: Secondary | ICD-10-CM | POA: Diagnosis not present

## 2020-05-17 DIAGNOSIS — E049 Nontoxic goiter, unspecified: Secondary | ICD-10-CM | POA: Diagnosis not present

## 2020-05-17 DIAGNOSIS — E1151 Type 2 diabetes mellitus with diabetic peripheral angiopathy without gangrene: Secondary | ICD-10-CM | POA: Diagnosis not present

## 2020-05-17 DIAGNOSIS — J9691 Respiratory failure, unspecified with hypoxia: Secondary | ICD-10-CM | POA: Diagnosis not present

## 2020-05-17 DIAGNOSIS — I5032 Chronic diastolic (congestive) heart failure: Secondary | ICD-10-CM | POA: Diagnosis not present

## 2020-05-17 DIAGNOSIS — I2510 Atherosclerotic heart disease of native coronary artery without angina pectoris: Secondary | ICD-10-CM | POA: Diagnosis not present

## 2020-05-17 DIAGNOSIS — Z794 Long term (current) use of insulin: Secondary | ICD-10-CM | POA: Diagnosis not present

## 2020-05-17 DIAGNOSIS — Z9181 History of falling: Secondary | ICD-10-CM | POA: Diagnosis not present

## 2020-05-17 DIAGNOSIS — Z4781 Encounter for orthopedic aftercare following surgical amputation: Secondary | ICD-10-CM | POA: Diagnosis not present

## 2020-05-17 DIAGNOSIS — D649 Anemia, unspecified: Secondary | ICD-10-CM | POA: Diagnosis not present

## 2020-05-17 DIAGNOSIS — Z7902 Long term (current) use of antithrombotics/antiplatelets: Secondary | ICD-10-CM | POA: Diagnosis not present

## 2020-05-17 DIAGNOSIS — I110 Hypertensive heart disease with heart failure: Secondary | ICD-10-CM | POA: Diagnosis not present

## 2020-05-17 DIAGNOSIS — E782 Mixed hyperlipidemia: Secondary | ICD-10-CM | POA: Diagnosis not present

## 2020-05-17 DIAGNOSIS — E039 Hypothyroidism, unspecified: Secondary | ICD-10-CM | POA: Diagnosis not present

## 2020-05-17 DIAGNOSIS — Z8701 Personal history of pneumonia (recurrent): Secondary | ICD-10-CM | POA: Diagnosis not present

## 2020-05-19 ENCOUNTER — Encounter (HOSPITAL_BASED_OUTPATIENT_CLINIC_OR_DEPARTMENT_OTHER): Payer: HMO | Attending: Physician Assistant | Admitting: Physician Assistant

## 2020-05-19 ENCOUNTER — Other Ambulatory Visit: Payer: Self-pay

## 2020-05-19 DIAGNOSIS — E1151 Type 2 diabetes mellitus with diabetic peripheral angiopathy without gangrene: Secondary | ICD-10-CM | POA: Diagnosis not present

## 2020-05-19 DIAGNOSIS — T8131XA Disruption of external operation (surgical) wound, not elsewhere classified, initial encounter: Secondary | ICD-10-CM | POA: Diagnosis not present

## 2020-05-19 DIAGNOSIS — L97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: Secondary | ICD-10-CM | POA: Diagnosis not present

## 2020-05-19 DIAGNOSIS — L97512 Non-pressure chronic ulcer of other part of right foot with fat layer exposed: Secondary | ICD-10-CM | POA: Diagnosis not present

## 2020-05-19 DIAGNOSIS — E1140 Type 2 diabetes mellitus with diabetic neuropathy, unspecified: Secondary | ICD-10-CM | POA: Insufficient documentation

## 2020-05-19 DIAGNOSIS — X58XXXA Exposure to other specified factors, initial encounter: Secondary | ICD-10-CM | POA: Insufficient documentation

## 2020-05-19 DIAGNOSIS — Z89421 Acquired absence of other right toe(s): Secondary | ICD-10-CM | POA: Diagnosis not present

## 2020-05-19 DIAGNOSIS — E11621 Type 2 diabetes mellitus with foot ulcer: Secondary | ICD-10-CM | POA: Diagnosis not present

## 2020-05-19 NOTE — Progress Notes (Addendum)
KALYANI, MAEDA ([id number]) Visit Report for 05/19/2020 Chief Complaint Document Details Patient Name: Date of Service: [name], [name] 05/19/2020 9:30 A M Medical Record Number: [id number] Patient Account Number: [id number] Date of Birth/Sex: Treating RN: [birth year] (86 y.o. [name] Primary Care Provider: Marton Matthews Other Clinician: Referring Provider: Treating Provider/Extender: Jarome Matin in Treatment: 5 Information Obtained from: Patient Chief Complaint Right foot ulcers s/p Tarsometatarsal Amputation Electronic Signature(s) Signed: 05/19/2020 9:48:11 AM By: Worthy Keeler PA-C Entered By: Worthy Keeler on 05/19/2020 09:48:10 -------------------------------------------------------------------------------- Debridement Details Patient Name: Date of Service: [name]. 05/19/2020 9:30 A M Medical Record Number: [id number] Patient Account Number: [id number] Date of Birth/Sex: Treating RN: [birth year] (86 y.o. [name], [name] Primary Care Provider: Marton Matthews Other Clinician: Referring Provider: Treating Provider/Extender: Jarome Matin in Treatment: 5 Debridement Performed for Assessment: Wound #2 Right Amputation Site - Transmetatarsal Performed By: Physician Worthy Keeler, PA Debridement Type: Debridement Severity of Tissue Pre Debridement: Necrosis of muscle Level of Consciousness (Pre-procedure): Awake and Alert Pre-procedure Verification/Time Out Yes - 10:30 Taken: Start Time: 10:32 Pain Control: Other : benzocaine 20% spray T Area Debrided (L x W): otal 1.5 (cm) x 1.5 (cm) = 2.25 (cm) Tissue and other material debrided: Viable, Non-Viable, Slough, Subcutaneous, Tendon, Slough Level: Skin/Subcutaneous Tissue/Muscle Debridement Description: Excisional Instrument: Forceps, Scissors Bleeding: Minimum Hemostasis Achieved: Pressure End Time: 10:39 Procedural Pain: 4 Post Procedural Pain: 1 Response  to Treatment: Procedure was tolerated well Level of Consciousness (Post- Awake and Alert procedure): Post Debridement Measurements of Total Wound Length: (cm) 4.5 Width: (cm) 5.5 Depth: (cm) 0.5 Volume: (cm) 9.719 Character of Wound/Ulcer Post Debridement: Improved Severity of Tissue Post Debridement: Necrosis of muscle Post Procedure Diagnosis Same as Pre-procedure Electronic Signature(s) Signed: 05/19/2020 5:20:58 PM By: Worthy Keeler PA-C Signed: 05/19/2020 5:51:45 PM By: Baruch Gouty RN, BSN Entered By: Baruch Gouty on 05/19/2020 10:37:39 -------------------------------------------------------------------------------- HPI Details Patient Name: Date of Service: [name]. 05/19/2020 9:30 A M Medical Record Number: [id number] Patient Account Number: [id number] Date of Birth/Sex: Treating RN: [birth year] (86 y.o. [name] Primary Care Provider: Marton Matthews Other Clinician: Referring Provider: Treating Provider/Extender: Jarome Matin in Treatment: 5 History of Present Illness HPI Description: 04/14/2020 upon evaluation today patient appears to be doing somewhat poorly in regard to her foot ulcers. She has a transmetatarsal amputation site which unfortunately has not healed as effectively as would have been hoped for. She also has a small wound on the lateral portion of her foot which seems to be unrelated necessarily to that and very superficial. Nonetheless the amputation site has a tremendous amount of necrotic tissue there is also 9 sutures remaining at this point. Nonetheless I feel like that the patient is going require debridement of this area she was referred to [place] by Dr. Doran Durand for evaluation and treatment he has suggested per his note that I reviewed from 03/31/2020 that she may require revision surgery to a below-knee amputation. With that being said obviously the patient wants to avoid this. I do believe that she may be a candidate  for a wound VAC for that reason I am going to perform debridement to remove the sutures as well as necrotic tissue to some degree so that we can get things moving along with regard to the wound VAC as well. Obviously I do not believe that any of the necrotic tissue nor the sutures are really doing much for  her as far as healing is concerned. She does have a history of diabetes mellitus type 2 she also has peripheral vascular disease fortunately she was seen by Dr. Trula Slade on 04/08/2020. He did actually reestablish good blood flow to the limb there was a 10 cm segment of the right superficial femoral artery with a 60% stenosis treated with balloon angioplasty. She also did have a subtotal occlusion to the origin of the right peroneal artery treated with arthrectomy and 3 mm balloon angioplasty there was no residual stenosis the patient does have some left renal artery stenosis. The good news is I do believe she has much better blood flow hopefully this will benefit her from the standpoint of healing. The patient's most recent hemoglobin A1c was 8.3. Unfortunately this all started with an ankle fracture in March subsequent to this she had a cast that was placed on her leg and apparently it was when they remove the cast for the 2nd time that they noticed this area underneath on her foot that led to what was thought to be initially just a 1st and 2nd toe amputation but led to a transmetatarsal amputation. That was on 03/11/2020. 04/21/2020 upon evaluation today patient actually appears to be doing quite well in regard to her foot ulcer all things considered. This is still significant wound there is some tendon exposed which is becoming necrotic and will need to be removed but other than this overall I feel like she is actually doing quite well. I do believe that wound VAC will benefit her with that being said we do not have the approval for that she received a box from [facility] but unfortunately is out of network  therefore began to get it through a different company. 05/05/2020 on evaluation today patient actually appears to be doing well with regard to her wound. Fortunately there is no signs of active infection at this time which is great news. She does have some slough buildup also she has got the wound VAC started which is excellent she seems to be doing well with this. 05/19/2020 on evaluation today patient's wound actually appears to be doing quite well. The wound VAC is doing an excellent job for her and overall I feel like she is making great progress. There is no sign of active infection at this time which is also great news. The only issue we have is apparently some complication with home health. They are saying that the night may be able to continue to come out of it was somewhat strange as to the reasons why. Nonetheless it appears the patient is in the midst of recertification. The wound VAC is helping and she is getting excellent benefit from the use of it therefore I do not think will have any trouble getting this reapproved. Electronic Signature(s) Signed: 05/19/2020 1:18:47 PM By: Worthy Keeler PA-C Entered By: Worthy Keeler on 05/19/2020 13:18:45 -------------------------------------------------------------------------------- Physical Exam Details Patient Name: Date of Service: [name], [name] 05/19/2020 9:30 A M Medical Record Number: [id number] Patient Account Number: [id number] Date of Birth/Sex: Treating RN: 06/27/1932 (86 y.o. [name] Primary Care Provider: Marton Matthews Other Clinician: Referring Provider: Treating Provider/Extender: Jarome Matin in Treatment: 5 Constitutional Well-nourished and well-hydrated in no acute distress. Respiratory normal breathing without difficulty. Psychiatric this patient is able to make decisions and demonstrates good insight into disease process. Alert and Oriented x 3. pleasant and cooperative. Notes Upon  inspection the patient's wound is showing signs of significant  improvement the only issue was it was not draped appropriately around the wound and therefore unfortunately the patient did have a little bit of damaged tissue around the edges of the wound but this was not significant. I do believe that with proper draping this would not be an issue. Otherwise the wound is doing quite well I did remove some necrotic tendon she tolerated that with only minimal discomfort overall post debridement this appears to be significantly improved and I feel like were very close to and overall excellent and well healing wound bed. Electronic Signature(s) Signed: 05/19/2020 1:19:23 PM By: Worthy Keeler PA-C Entered By: Worthy Keeler on 05/19/2020 13:19:22 -------------------------------------------------------------------------------- Physician Orders Details Patient Name: Date of Service: [name]. 05/19/2020 9:30 A M Medical Record Number: [id number] Patient Account Number: [id number] Date of Birth/Sex: Treating RN: Mar 13, 1932 (86 y.o. [name], [name] Primary Care Provider: Marton Matthews Other Clinician: Referring Provider: Treating Provider/Extender: Jarome Matin in Treatment: 5 Verbal / Phone Orders: No Diagnosis Coding ICD-10 Coding Code Description E11.621 Type 2 diabetes mellitus with foot ulcer T81.31XA Disruption of external operation (surgical) wound, not elsewhere classified, initial encounter L97.512 Non-pressure chronic ulcer of other part of right foot with fat layer exposed L97.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 I73.89 Other specified peripheral vascular diseases Follow-up Appointments Return Appointment in 2 weeks. Dressing Change Frequency Wound #2 Right Amputation Site - Transmetatarsal Change dressing three times week. Skin Barriers/Peri-Wound Care Skin Prep - under drape Wound Cleansing Wound #2  Right Amputation Site - Transmetatarsal Clean wound with Normal Saline. May shower with protection. Secondary Dressing Wound #2 Right Amputation Site - Transmetatarsal Kerlix/Rolled Gauze - in clinic ABD pad - in clinic Negative Presssure Wound Therapy Wound #2 Right Amputation Site - Transmetatarsal Wound Vac to wound continuously at 145mm/hg pressure - home health to apply Black Foam Other: - may use ace wrap to protect Off-Loading Removable cast walker boot to: - right foot to ambulate Other: - no weight bearing right foot [place] [facility] for wound care. Jackquline [place] Electronic Signature(s) Signed: 05/19/2020 5:20:58 PM By: Worthy Keeler PA-C Signed: 05/19/2020 5:51:45 PM By: Baruch Gouty RN, BSN Entered By: Baruch Gouty on 05/19/2020 11:02:30 -------------------------------------------------------------------------------- Problem List Details Patient Name: Date of Service: [name]. 05/19/2020 9:30 A M Medical Record Number: [id number] Patient Account Number: [id number] Date of Birth/Sex: Treating RN: 10/01/1931 (86 y.o. [name], Linda Primary Care Provider: Marton Matthews Other Clinician: Referring Provider: Treating Provider/Extender: Jarome Matin in Treatment: 5 Active Problems ICD-10 Encounter Code Description Active Date MDM Diagnosis E11.621 Type 2 diabetes mellitus with foot ulcer 04/14/2020 No Yes T81.31XA Disruption of external operation (surgical) wound, not elsewhere classified, 04/14/2020 No Yes initial encounter L97.512 Non-pressure chronic ulcer of other part of right foot with fat layer exposed 04/14/2020 No Yes L97.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 04/21/2020 No Yes I73.89 Other specified peripheral vascular diseases 04/14/2020 No Yes Inactive Problems Resolved Problems Electronic Signature(s) Signed: 05/19/2020 9:46:23 AM By: Worthy Keeler PA-C Entered By: Worthy Keeler on 05/19/2020 09:46:23 -------------------------------------------------------------------------------- Progress Note Details Patient Name: Date of Service: [name]. 05/19/2020 9:30 A M Medical Record Number: [id number] Patient Account Number: [id number] Date of Birth/Sex: Treating RN: 10-12-1931 (86 y.o. [name] Primary Care Provider: Marton Matthews Other Clinician: Referring Provider: Treating Provider/Extender: Worthy Keeler  [name] Weeks in Treatment: 5 Subjective Chief Complaint Information obtained from Patient Right foot ulcers s/p Tarsometatarsal Amputation History of Present Illness (HPI) 04/14/2020 upon evaluation today patient appears to be doing somewhat poorly in regard to her foot ulcers. She has a transmetatarsal amputation site which unfortunately has not healed as effectively as would have been hoped for. She also has a small wound on the lateral portion of her foot which seems to be unrelated necessarily to that and very superficial. Nonetheless the amputation site has a tremendous amount of necrotic tissue there is also 9 sutures remaining at this point. Nonetheless I feel like that the patient is going require debridement of this area she was referred to [place] by Dr. Doran Durand for evaluation and treatment he has suggested per his note that I reviewed from 03/31/2020 that she may require revision surgery to a below-knee amputation. With that being said obviously the patient wants to avoid this. I do believe that she may be a candidate for a wound VAC for that reason I am going to perform debridement to remove the sutures as well as necrotic tissue to some degree so that we can get things moving along with regard to the wound VAC as well. Obviously I do not believe that any of the necrotic tissue nor the sutures are really doing much for her as far as healing is concerned. She does have a history of diabetes mellitus  type 2 she also has peripheral vascular disease fortunately she was seen by Dr. Trula Slade on 04/08/2020. He did actually reestablish good blood flow to the limb there was a 10 cm segment of the right superficial femoral artery with a 60% stenosis treated with balloon angioplasty. She also did have a subtotal occlusion to the origin of the right peroneal artery treated with arthrectomy and 3 mm balloon angioplasty there was no residual stenosis the patient does have some left renal artery stenosis. The good news is I do believe she has much better blood flow hopefully this will benefit her from the standpoint of healing. The patient's most recent hemoglobin A1c was 8.3. Unfortunately this all started with an ankle fracture in March subsequent to this she had a cast that was placed on her leg and apparently it was when they remove the cast for the 2nd time that they noticed this area underneath on her foot that led to what was thought to be initially just a 1st and 2nd toe amputation but led to a transmetatarsal amputation. That was on 03/11/2020. 04/21/2020 upon evaluation today patient actually appears to be doing quite well in regard to her foot ulcer all things considered. This is still significant wound there is some tendon exposed which is becoming necrotic and will need to be removed but other than this overall I feel like she is actually doing quite well. I do believe that wound VAC will benefit her with that being said we do not have the approval for that she received a box from [facility] but unfortunately is out of network therefore began to get it through a different company. 05/05/2020 on evaluation today patient actually appears to be doing well with regard to her wound. Fortunately there is no signs of active infection at this time which is great news. She does have some slough buildup also she has got the wound VAC started which is excellent she seems to be doing well with this. 05/19/2020 on  evaluation today patient's wound actually appears to be doing quite well.  The wound VAC is doing an excellent job for her and overall I feel like she is making great progress. There is no sign of active infection at this time which is also great news. The only issue we have is apparently some complication with home health. They are saying that the night may be able to continue to come out of it was somewhat strange as to the reasons why. Nonetheless it appears the patient is in the midst of recertification. The wound VAC is helping and she is getting excellent benefit from the use of it therefore I do not think will have any trouble getting this reapproved. Objective Constitutional Well-nourished and well-hydrated in no acute distress. Vitals Time Taken: 9:53 AM, Height: 68 in, Weight: 167 lbs, BMI: 25.4, Temperature: 98.8 F, Pulse: 84 bpm, Respiratory Rate: 18 breaths/min, Blood Pressure: 169/76 mmHg, Capillary Blood Glucose: 90 mg/dl. Respiratory normal breathing without difficulty. Psychiatric this patient is able to make decisions and demonstrates good insight into disease process. Alert and Oriented x 3. pleasant and cooperative. General Notes: Upon inspection the patient's wound is showing signs of significant improvement the only issue was it was not draped appropriately around the wound and therefore unfortunately the patient did have a little bit of damaged tissue around the edges of the wound but this was not significant. I do believe that with proper draping this would not be an issue. Otherwise the wound is doing quite well I did remove some necrotic tendon she tolerated that with only minimal discomfort overall post debridement this appears to be significantly improved and I feel like were very close to and overall excellent and well healing wound bed. Integumentary (Hair, Skin) Wound #1 status is Healed - Epithelialized. Original cause of wound was Gradually Appeared. The wound is  located on the Right,Medial Foot. The wound measures 0cm length x 0cm width x 0cm depth; 0cm^2 area and 0cm^3 volume. Wound #2 status is Open. Original cause of wound was Surgical Injury. The wound is located on the Right Amputation Site - Transmetatarsal. The wound measures 4.5cm length x 5.5cm width x 0.5cm depth; 19.439cm^2 area and 9.719cm^3 volume. There is Fat Layer (Subcutaneous Tissue) exposed. There is no tunneling or undermining noted. There is a large amount of serosanguineous drainage noted. The wound margin is flat and intact. There is large (67-100%) red, pink granulation within the wound bed. There is a small (1-33%) amount of necrotic tissue within the wound bed including Adherent Slough. 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 There was a Excisional Skin/Subcutaneous Tissue/Muscle Debridement with a total area of 2.25 sq cm performed by Worthy Keeler, PA. With the following instrument(s): Forceps, and Scissors to remove Viable and Non-Viable tissue/material. Material removed includes T endon, Subcutaneous Tissue, and Slough after achieving pain control using Other (benzocaine 20% spray). No specimens were taken. A time out was conducted at 10:30, prior to the start of the procedure. A Minimum amount of bleeding was controlled with Pressure. The procedure was tolerated well with a pain level of 4 throughout and a  pain level of 1 following the procedure. Post  Debridement Measurements: 4.5cm length x 5.5cm width x 0.5cm depth; 9.719cm^3 volume. Character of Wound/Ulcer Post Debridement is improved. Severity of Tissue Post Debridement is: Necrosis of muscle. Post procedure Diagnosis Wound #2: Same as Pre-Procedure Plan Follow-up Appointments: Return Appointment in 2 weeks. Dressing Change Frequency: Wound #2 Right Amputation Site - Transmetatarsal: Change dressing three times week. Skin Barriers/Peri-Wound Care: Skin Prep - under drape Wound Cleansing: Wound #2 Right Amputation Site - Transmetatarsal: Clean wound with Normal Saline. May shower with protection. Secondary Dressing: Wound #2 Right Amputation Site - Transmetatarsal: Kerlix/Rolled Gauze - in clinic ABD pad - in clinic Negative Presssure Wound Therapy: Wound #2 Right Amputation Site - Transmetatarsal: Wound Vac to wound continuously at 173mm/hg pressure - home health to apply Black Foam Other: - may use ace wrap to protect Off-Loading: Removable cast walker boot to: - right foot to ambulate Other: - no weight bearing right foot Home Health: [place] skilled [facility] for wound care. - [facility]. Would recommend currently that we going to continue with the wound care measures as before and the patient is in agreement with that plan this includes the use of the wound VAC we did discuss with home health and this should be something they can continue to do. 2. I am also can recommend that the patient be appropriately draped around the edges of the wound and then the foam applied topically. 3. I am also can recommend that the patient continue to use her boot for offloading I think that is of utmost importance. We will see patient back for reevaluation in 2 weeks here in the clinic. If anything worsens or changes patient will contact our office for additional recommendations. Electronic Signature(s) Signed: 05/19/2020 1:20:04 PM By: Worthy Keeler PA-C Entered By: Worthy Keeler on 05/19/2020 13:20:03 -------------------------------------------------------------------------------- SuperBill Details Patient Name: Date of Service: [name] 05/19/2020 Medical Record Number: [id number] Patient Account Number: [id number] Date of Birth/Sex: Treating RN: 18-Jul-1931 (86 y.o. [name], [name] Primary Care Provider: Marton Matthews Other Clinician: Referring Provider: Treating Provider/Extender: Jarome Matin in Treatment: 5 Diagnosis Coding ICD-10 Codes Code Description [phone] Type 2 diabetes mellitus with foot ulcer T81.31XA Disruption of external operation (surgical) wound, not elsewhere classified, initial encounter L97.512 Non-pressure chronic ulcer of other part of right foot with fat layer exposed L97.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 I73.89 Other specified peripheral vascular diseases Facility Procedures CPT4 Code: [id number] Description: 37628 - DEB MUSC/FASCIA 20 SQ CM/< ICD-10 Diagnosis Description L97.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 Modifier: Quantity: 1 CPT4 Code: [id number] Description: 07371 - WOUND VAC-50 SQ CM OR LESS Modifier: 59 Quantity: 1 Physician Procedures : CPT4 Code Description Modifier [id number] 11043 - WC PHYS DEBR MUSCLE/FASCIA 20 SQ CM ICD-10 Diagnosis Description L97.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 Quantity: 1 Electronic Signature(s) Signed: 05/19/2020 5:51:45 PM By: Baruch Gouty RN, BSN Signed: 05/21/2020 4:26:05 PM By: Worthy Keeler PA-C Previous Signature: 05/19/2020 1:20:16 PM Version By: Worthy Keeler PA-C Entered By: Baruch Gouty on 05/19/2020 17:28:08

## 2020-05-21 NOTE — Progress Notes (Addendum)
[name], [name] ([id number]) Visit Report for 05/19/2020 Arrival Information Details Patient Name: Date of Service: [name], [name] 05/19/2020 9:30 A M Medical Record Number: [id number] Patient Account Number: [id number] Date of Birth/Sex: Treating RN: Apr 05, 1932 (86 y.o. [name], Meta.Reding Primary Care Jotham Ahn: Marton Redwood Other Clinician: Referring Michala Deblanc: Treating Raj Landress/Extender: Jarome Matin in Treatment: 5 Visit Information History Since Last Visit Added or deleted any medications: No Patient Arrived: [name] Any new allergies or adverse reactions: No Arrival Time: 09:52 Had a fall or experienced change in No Accompanied By: friend activities of daily living that may affect Transfer Assistance: None risk of falls: Patient Identification Verified: Yes Signs or symptoms of abuse/neglect since last No Secondary Verification Process Completed: Yes visito Patient Requires Transmission-Based Precautions: No Hospitalized since last visit: No Patient Has Alerts: Yes Implantable device outside of the clinic No Patient Alerts: Patient on Blood Thinner excluding cellular tissue based products placed in the center since last visit: Has Dressing in Place as Prescribed: Yes Has Footwear/Offloading in Place as Yes Prescribed: Right: Removable Cast Walker/Walking Boot Pain Present Now: No Electronic Signature(s) Signed: 05/19/2020 5:31:28 PM By: Deon Pilling Entered By: Deon Pilling on 05/19/2020 10:06:27 -------------------------------------------------------------------------------- Encounter Discharge Information Details Patient Name: Date of Service: Jeanette Quiet. 05/19/2020 9:30 A M Medical Record Number: [id number] Patient Account Number: [id number] Date of Birth/Sex: Treating RN: 1931-11-04 (86 y.o. [name] Primary Care Lenord Fralix: Marton Redwood Other Clinician: Referring Avianna Moynahan: Treating Jhostin Epps/Extender: Jarome Matin in Treatment: 5 Encounter Discharge Information Items Post Procedure Vitals Discharge Condition: Stable Temperature (F): 98.8 Ambulatory Status: Walker Pulse (bpm): 84 Discharge Destination: Home Respiratory Rate (breaths/min): 18 Transportation: Private Auto Blood Pressure (mmHg): 169/76 Accompanied By: friend Schedule Follow-up Appointment: Yes Clinical Summary of Care: Patient Declined Electronic Signature(s) Signed: 05/21/2020 5:00:24 PM By: Carlene Coria RN Entered By: Carlene Coria on 05/19/2020 11:42:55 -------------------------------------------------------------------------------- Lower Extremity Assessment Details Patient Name: Date of Service: [name], [name] 05/19/2020 9:30 A M Medical Record Number: [id number] Patient Account Number: [id number] Date of Birth/Sex: Treating RN: [birth year] (86 y.o. [name], [name] Primary Care Marcellous Snarski: Marton Redwood Other Clinician: Referring Torra Pala: Treating Donis Kotowski/Extender: Jarome Matin in Treatment: 5 Edema Assessment Assessed: [Left: No] [Right: Yes] Edema: [Left: Ye] [Right: s] Calf Left: Right: Point of Measurement: 32 cm From Medial Instep 30.5 cm Ankle Left: Right: Point of Measurement: 11 cm From Medial Instep 20.5 cm Electronic Signature(s) Signed: 05/19/2020 5:31:28 PM By: Deon Pilling Entered By: Deon Pilling on 05/19/2020 10:07:13 -------------------------------------------------------------------------------- Multi Wound Chart Details Patient Name: Date of Service: Jeanette Quiet. 05/19/2020 9:30 A M Medical Record Number: [id number] Patient Account Number: [id number] Date of Birth/Sex: Treating RN: [birth year] (86 y.o. [name] Primary Care Ghassan Coggeshall: Marton Redwood Other Clinician: Referring Heavenlee Maiorana: Treating Becca Bayne/Extender: Jarome Matin in Treatment: 5 Vital Signs Height(in): 68 Capillary Blood Glucose(mg/dl): 90 Weight(lbs):  167 Pulse(bpm): 84 Body Mass Index(BMI): 25 Blood Pressure(mmHg): 169/76 Temperature(F): 98.8 Respiratory Rate(breaths/min): 18 Photos: [1:No Photos Right, Medial Foot] [2:No Photos Right Amputation Site -] [N/A:N/A N/A] Wound Location: [1:Gradually Appeared] [2:Transmetatarsal Surgical Injury] [N/A:N/A] Wounding Event: [1:Diabetic Wound/Ulcer of the Lower] [2:Open Surgical Wound] [N/A:N/A] Primary Etiology: [1:Extremity N/A] [2:Arterial Insufficiency Ulcer] [N/A:N/A] Secondary Etiology: [1:N/A] [2:Congestive Heart Failure, Deep Vein] [N/A:N/A] Comorbid History: [1:03/11/2020] [2:Thrombosis, Hypertension, Peripheral Arterial Disease, Peripheral Venous Disease, Type II Diabetes, Neuropathy 03/11/2020] [N/A:N/A] Date Acquired: [1:5] [2:5] [N/A:N/A] Weeks of Treatment: [1:Healed - Epithelialized] [2:Open] [N/A:N/A] Wound Status: [  1:0x0x0] [2:4.5x5.5x0.5] [N/A:N/A] Measurements L x W x D (cm) [1:0] [2:19.439] [N/A:N/A] A (cm) : rea [1:0] [6:9.450] [N/A:N/A] Volume (cm) : [1:100.00%] [2:39.00%] [N/A:N/A] % Reduction in Area: [1:100.00%] [2:74.60%] [N/A:N/A] % Reduction in Volume: [1:Grade 2] [2:Full Thickness Without Exposed] [N/A:N/A] Classification: [1:N/A] [2:Support Structures Large] [N/A:N/A] Exudate A mount: [1:N/A] [2:Serosanguineous] [N/A:N/A] Exudate Type: [1:N/A] [2:red, brown] [N/A:N/A] Exudate Color: [1:N/A] [2:Flat and Intact] [N/A:N/A] Wound Margin: [1:N/A] [2:Large (67-100%)] [N/A:N/A] Granulation A mount: [1:N/A] [2:Red, Pink] [N/A:N/A] Granulation Quality: [1:N/A] [2:Small (1-33%)] [N/A:N/A] Necrotic A mount: [1:N/A] [2:Medium (34-66%)] [N/A:N/A] Epithelialization: [1:N/A] [2:Debridement - Excisional] [N/A:N/A] Debridement: [1:N/A] [2:10:30] [N/A:N/A] Pre-procedure Verification/Time Out Taken: [1:N/A] [2:Other] [N/A:N/A] Pain Control: [1:N/A] [2:Tendon, Subcutaneous, Slough] [N/A:N/A] Tissue Debrided: [1:N/A] [2:Skin/Subcutaneous Tissue/Muscle] [N/A:N/A] Level:  [1:N/A] [2:2.25] [N/A:N/A] Debridement A (sq cm): [1:rea N/A] [2:Forceps, Scissors] [N/A:N/A] Instrument: [1:N/A] [2:Minimum] [N/A:N/A] Bleeding: [1:N/A] [2:Pressure] [N/A:N/A] Hemostasis A chieved: [1:N/A] [2:4] [N/A:N/A] Procedural Pain: [1:N/A] [2:1] [N/A:N/A] Post Procedural Pain: [1:N/A] [2:Procedure was tolerated well] [N/A:N/A] Debridement Treatment Response: [1:N/A] [2:4.5x5.5x0.5] [N/A:N/A] Post Debridement Measurements L x W x D (cm) [1:N/A] [3:8.882] [N/A:N/A] Post Debridement Volume: (cm) [1:N/A] [2:Debridement] [N/A:N/A] Procedures Performed: [2:Negative Pressure Wound Therapy Application (NPWT)] Treatment Notes Wound #2 (Right Amputation Site - Transmetatarsal) 1. Cleanse With Wound Cleanser 3. Primary Dressing Applied Other primary dressing (specifiy in notes) Notes wound vac at 125 mmhg using 2 pieces of black foam applied as ordered , seal noted Electronic Signature(s) Signed: 05/21/2020 6:06:29 PM By: Baruch Gouty RN, BSN Entered By: Baruch Gouty on 05/21/2020 17:46:05 -------------------------------------------------------------------------------- Multi-Disciplinary Care Plan Details Patient Name: Date of Service: Jeanette Quiet. 05/19/2020 9:30 A M Medical Record Number: [id number] Patient Account Number: [id number] Date of Birth/Sex: Treating RN: 07-22-1931 (86 y.o. [name] Primary Care Mckinzy Fuller: Marton Redwood Other Clinician: Referring Cortez Steelman: Treating Roarke Marciano/Extender: Jarome Matin in Treatment: 5 Active Inactive Nutrition Nursing Diagnoses: Impaired glucose control: actual or potential Potential for alteratiion in Nutrition/Potential for imbalanced nutrition Goals: Patient/caregiver will maintain therapeutic glucose control Date Initiated: 04/14/2020 Target Resolution Date: 06/16/2020 Goal Status: Active Interventions: Assess HgA1c results as ordered upon admission and as needed Assess patient nutrition  upon admission and as needed per policy Treatment Activities: Patient referred to Primary Care Physician for further nutritional evaluation : 04/14/2020 Notes: Wound/Skin Impairment Nursing Diagnoses: Impaired tissue integrity Knowledge deficit related to ulceration/compromised skin integrity Goals: Patient/caregiver will verbalize understanding of skin care regimen Date Initiated: 04/14/2020 Target Resolution Date: 06/16/2020 Goal Status: Active Ulcer/skin breakdown will have a volume reduction of 30% by week 4 Date Initiated: 04/14/2020 Date Inactivated: 05/19/2020 Target Resolution Date: 05/12/2020 Goal Status: Met Ulcer/skin breakdown will have a volume reduction of 50% by week 8 Date Initiated: 05/19/2020 Target Resolution Date: 06/16/2020 Goal Status: Active Interventions: Assess patient/caregiver ability to obtain necessary supplies Assess patient/caregiver ability to perform ulcer/skin care regimen upon admission and as needed Assess ulceration(s) every visit Provide education on ulcer and skin care Treatment Activities: Skin care regimen initiated : 04/14/2020 Topical wound management initiated : 04/14/2020 Notes: Electronic Signature(s) Signed: 05/19/2020 5:51:45 PM By: Baruch Gouty RN, BSN Entered By: Baruch Gouty on 05/19/2020 10:30:06 -------------------------------------------------------------------------------- Negative Pressure Wound Therapy Application (NPWT) Details Patient Name: Date of Service: [name], [name] 05/19/2020 9:30 A M Medical Record Number: [id number] Patient Account Number: [id number] Date of Birth/Sex: Treating RN: 03-Jun-1932 (86 y.o. [name] Primary Care Kamarrion Stfort: Marton Redwood Other Clinician: Referring Tomasina Keasling: Treating Pamala Hayman/Extender: Jarome Matin in Treatment: 5 NPWT Application Performed for: Wound #2 Right  Amputation Site - Transmetatarsal Performed By: Baruch Gouty, RN Type: Other Coverage  Size (sq cm): 24.75 Pressure Type: Constant Pressure Setting: 125 mmHG Drain Type: None Quantity of Sponges/Gauze Inserted: 1 Sponge/Dressing Type: Foam, Black Date Initiated: 04/30/2020 Response to Treatment: good Post Procedure Diagnosis Same as Pre-procedure Notes medela Electronic Signature(s) Signed: 05/19/2020 5:51:45 PM By: Baruch Gouty RN, BSN Entered By: Baruch Gouty on 05/19/2020 17:26:58 -------------------------------------------------------------------------------- Pain Assessment Details Patient Name: Date of Service: Jeanette Quiet. 05/19/2020 9:30 A M Medical Record Number: [id number] Patient Account Number: [id number] Date of Birth/Sex: Treating RN: February 08, 1932 (86 y.o. Debby Bud Primary Care Fontaine Kossman: Marton Redwood Other Clinician: Referring Almeta Geisel: Treating Remi Lopata/Extender: Jarome Matin in Treatment: 5 Active Problems Location of Pain Severity and Description of Pain Patient Has Paino No Site Locations Rate the pain. Current Pain Level: 0 Pain Management and Medication Current Pain Management: Medication: No Cold Application: No Rest: No Massage: No Activity: No T.E.N.S.: No Heat Application: No Leg drop or elevation: No Is the Current Pain Management Adequate: Adequate How does your wound impact your activities of daily livingo Sleep: No Bathing: No Appetite: No Relationship With Others: No Bladder Continence: No Emotions: No Bowel Continence: No Work: No Toileting: No Drive: No Dressing: No Hobbies: No Electronic Signature(s) Signed: 05/19/2020 5:31:28 PM By: Deon Pilling Entered By: Deon Pilling on 05/19/2020 10:07:00 -------------------------------------------------------------------------------- Patient/Caregiver Education Details Patient Name: Date of Service: Jeanette Quiet 11/3/2021andnbsp9:30 A M Medical Record Number: [id number] Patient Account Number: [id number] Date of  Birth/Gender: Treating RN: [birth year] (86 y.o. [name] Primary Care Physician: Marton Redwood Other Clinician: Referring Physician: Treating Physician/Extender: Jarome Matin in Treatment: 5 Education Assessment Education Provided To: Patient Education Topics Provided Tissue Oxygenation: Methods: Explain/Verbal Responses: Reinforcements needed, State content correctly Wound/Skin Impairment: Methods: Explain/Verbal Responses: Reinforcements needed, State content correctly Electronic Signature(s) Signed: 05/19/2020 5:51:45 PM By: Baruch Gouty RN, BSN Entered By: Baruch Gouty on 05/19/2020 10:31:13 -------------------------------------------------------------------------------- Wound Assessment Details Patient Name: Date of Service: Jeanette Quiet. 05/19/2020 9:30 A M Medical Record Number: [id number] Patient Account Number: [id number] Date of Birth/Sex: Treating RN: October 30, 1931 (86 y.o. [name], Meta.Reding Primary Care Shanita Kanan: Marton Redwood Other Clinician: Referring Dvora Buitron: Treating Dalani Mette/Extender: Jarome Matin in Treatment: 5 Wound Status Wound Number: 1 Primary Etiology: Diabetic Wound/Ulcer of the Lower Extremity Wound Location: Right, Medial Foot Wound Status: Healed - Epithelialized Wounding Event: Gradually Appeared Date Acquired: 03/11/2020 Weeks Of Treatment: 5 Clustered Wound: No Wound Measurements Length: (cm) Width: (cm) Depth: (cm) Area: (cm) Volume: (cm) 0 % Reduction in Area: 100% 0 % Reduction in Volume: 100% 0 0 0 Wound Description Classification: Grade 2 Electronic Signature(s) Signed: 05/19/2020 5:31:28 PM By: Deon Pilling Signed: 05/19/2020 5:31:28 PM By: Deon Pilling Entered By: Deon Pilling on 05/19/2020 10:07:25 -------------------------------------------------------------------------------- Wound Assessment Details Patient Name: Date of Service: Jeanette Quiet.  05/19/2020 9:30 A M Medical Record Number: [id number] Patient Account Number: [id number] Date of Birth/Sex: Treating RN: January 31, 1932 (86 y.o. [name], [name] Primary Care Tekla Malachowski: Marton Redwood Other Clinician: Referring Brelyn Woehl: Treating Shantanique Hodo/Extender: Jarome Matin in Treatment: 5 Wound Status Wound Number: 2 Primary Open Surgical Wound Etiology: Wound Location: Right Amputation Site - Transmetatarsal Secondary Arterial Insufficiency Ulcer Wounding Event: Surgical Injury Etiology: Date Acquired: 03/11/2020 Wound Open Weeks Of Treatment: 5 Status: Clustered Wound: No Comorbid Congestive Heart Failure, Deep Vein Thrombosis, Hypertension, History: Peripheral Arterial Disease, Peripheral Venous Disease, Type II Diabetes, Neuropathy Wound  Measurements Length: (cm) 4.5 Width: (cm) 5.5 Depth: (cm) 0.5 Area: (cm) 19.439 Volume: (cm) 9.719 % Reduction in Area: 39% % Reduction in Volume: 74.6% Epithelialization: Medium (34-66%) Tunneling: No Undermining: No Wound Description Classification: Full Thickness Without Exposed Support Structures Wound Margin: Flat and Intact Exudate Amount: Large Exudate Type: Serosanguineous Exudate Color: red, brown Foul Odor After Cleansing: No Slough/Fibrino Yes Wound Bed Granulation Amount: Large (67-100%) Exposed Structure Granulation Quality: Red, Pink Fascia Exposed: No Necrotic Amount: Small (1-33%) Fat Layer (Subcutaneous Tissue) Exposed: Yes Necrotic Quality: Adherent Slough Tendon Exposed: No Muscle Exposed: No Joint Exposed: No Bone Exposed: No Treatment Notes Wound #2 (Right Amputation Site - Transmetatarsal) 1. Cleanse With Wound Cleanser 3. Primary Dressing Applied Other primary dressing (specifiy in notes) Notes wound vac at 125 mmhg using 2 pieces of black foam applied as ordered , seal noted Electronic Signature(s) Signed: 05/19/2020 5:31:28 PM By: Deon Pilling Entered By: Deon Pilling  on 05/19/2020 10:07:58 -------------------------------------------------------------------------------- Vitals Details Patient Name: Date of Service: Jeanette Quiet. 05/19/2020 9:30 A M Medical Record Number: [id number] Patient Account Number: [id number] Date of Birth/Sex: Treating RN: 1932-04-05 (86 y.o. [name], Meta.Reding Primary Care Swetha Rayle: Marton Redwood Other Clinician: Referring Nikitta Sobiech: Treating Shineka Auble/Extender: Jarome Matin in Treatment: 5 Vital Signs Time Taken: 09:53 Temperature (F): 98.8 Height (in): 68 Pulse (bpm): 84 Weight (lbs): 167 Respiratory Rate (breaths/min): 18 Body Mass Index (BMI): 25.4 Blood Pressure (mmHg): 169/76 Capillary Blood Glucose (mg/dl): 90 Reference Range: 80 - 120 mg / dl Electronic Signature(s) Signed: 05/19/2020 5:31:28 PM By: Deon Pilling Entered By: Deon Pilling on 05/19/2020 10:06:50

## 2020-05-24 DIAGNOSIS — Z8701 Personal history of pneumonia (recurrent): Secondary | ICD-10-CM | POA: Diagnosis not present

## 2020-05-24 DIAGNOSIS — I5032 Chronic diastolic (congestive) heart failure: Secondary | ICD-10-CM | POA: Diagnosis not present

## 2020-05-24 DIAGNOSIS — E782 Mixed hyperlipidemia: Secondary | ICD-10-CM | POA: Diagnosis not present

## 2020-05-24 DIAGNOSIS — E049 Nontoxic goiter, unspecified: Secondary | ICD-10-CM | POA: Diagnosis not present

## 2020-05-24 DIAGNOSIS — I110 Hypertensive heart disease with heart failure: Secondary | ICD-10-CM | POA: Diagnosis not present

## 2020-05-24 DIAGNOSIS — E039 Hypothyroidism, unspecified: Secondary | ICD-10-CM | POA: Diagnosis not present

## 2020-05-24 DIAGNOSIS — J9691 Respiratory failure, unspecified with hypoxia: Secondary | ICD-10-CM | POA: Diagnosis not present

## 2020-05-24 DIAGNOSIS — Z7902 Long term (current) use of antithrombotics/antiplatelets: Secondary | ICD-10-CM | POA: Diagnosis not present

## 2020-05-24 DIAGNOSIS — Z4781 Encounter for orthopedic aftercare following surgical amputation: Secondary | ICD-10-CM | POA: Diagnosis not present

## 2020-05-24 DIAGNOSIS — I2510 Atherosclerotic heart disease of native coronary artery without angina pectoris: Secondary | ICD-10-CM | POA: Diagnosis not present

## 2020-05-24 DIAGNOSIS — Z794 Long term (current) use of insulin: Secondary | ICD-10-CM | POA: Diagnosis not present

## 2020-05-24 DIAGNOSIS — D649 Anemia, unspecified: Secondary | ICD-10-CM | POA: Diagnosis not present

## 2020-05-24 DIAGNOSIS — Z9181 History of falling: Secondary | ICD-10-CM | POA: Diagnosis not present

## 2020-05-24 DIAGNOSIS — K219 Gastro-esophageal reflux disease without esophagitis: Secondary | ICD-10-CM | POA: Diagnosis not present

## 2020-05-24 DIAGNOSIS — E1151 Type 2 diabetes mellitus with diabetic peripheral angiopathy without gangrene: Secondary | ICD-10-CM | POA: Diagnosis not present

## 2020-05-31 ENCOUNTER — Other Ambulatory Visit: Payer: Self-pay | Admitting: Interventional Cardiology

## 2020-05-31 DIAGNOSIS — K219 Gastro-esophageal reflux disease without esophagitis: Secondary | ICD-10-CM | POA: Diagnosis not present

## 2020-05-31 DIAGNOSIS — I5032 Chronic diastolic (congestive) heart failure: Secondary | ICD-10-CM | POA: Diagnosis not present

## 2020-05-31 DIAGNOSIS — I2510 Atherosclerotic heart disease of native coronary artery without angina pectoris: Secondary | ICD-10-CM | POA: Diagnosis not present

## 2020-05-31 DIAGNOSIS — E11621 Type 2 diabetes mellitus with foot ulcer: Secondary | ICD-10-CM | POA: Diagnosis not present

## 2020-05-31 DIAGNOSIS — T8131XA Disruption of external operation (surgical) wound, not elsewhere classified, initial encounter: Secondary | ICD-10-CM | POA: Diagnosis not present

## 2020-05-31 DIAGNOSIS — Z4781 Encounter for orthopedic aftercare following surgical amputation: Secondary | ICD-10-CM | POA: Diagnosis not present

## 2020-05-31 DIAGNOSIS — Z7902 Long term (current) use of antithrombotics/antiplatelets: Secondary | ICD-10-CM | POA: Diagnosis not present

## 2020-05-31 DIAGNOSIS — E1151 Type 2 diabetes mellitus with diabetic peripheral angiopathy without gangrene: Secondary | ICD-10-CM | POA: Diagnosis not present

## 2020-05-31 DIAGNOSIS — E039 Hypothyroidism, unspecified: Secondary | ICD-10-CM | POA: Diagnosis not present

## 2020-05-31 DIAGNOSIS — Z794 Long term (current) use of insulin: Secondary | ICD-10-CM | POA: Diagnosis not present

## 2020-05-31 DIAGNOSIS — E049 Nontoxic goiter, unspecified: Secondary | ICD-10-CM | POA: Diagnosis not present

## 2020-05-31 DIAGNOSIS — E782 Mixed hyperlipidemia: Secondary | ICD-10-CM | POA: Diagnosis not present

## 2020-05-31 DIAGNOSIS — Z8701 Personal history of pneumonia (recurrent): Secondary | ICD-10-CM | POA: Diagnosis not present

## 2020-05-31 DIAGNOSIS — D649 Anemia, unspecified: Secondary | ICD-10-CM | POA: Diagnosis not present

## 2020-05-31 DIAGNOSIS — J9691 Respiratory failure, unspecified with hypoxia: Secondary | ICD-10-CM | POA: Diagnosis not present

## 2020-05-31 DIAGNOSIS — I110 Hypertensive heart disease with heart failure: Secondary | ICD-10-CM | POA: Diagnosis not present

## 2020-05-31 DIAGNOSIS — Z9181 History of falling: Secondary | ICD-10-CM | POA: Diagnosis not present

## 2020-06-02 ENCOUNTER — Encounter (HOSPITAL_BASED_OUTPATIENT_CLINIC_OR_DEPARTMENT_OTHER): Payer: HMO | Admitting: Physician Assistant

## 2020-06-02 ENCOUNTER — Other Ambulatory Visit: Payer: Self-pay

## 2020-06-02 DIAGNOSIS — T8131XA Disruption of external operation (surgical) wound, not elsewhere classified, initial encounter: Secondary | ICD-10-CM | POA: Diagnosis not present

## 2020-06-02 NOTE — Progress Notes (Addendum)
[name], [name] ([id number]) Visit Report for 06/02/2020 Chief Complaint Document Details Patient Name: Date of Service: [name] 06/02/2020 11:00 A M Medical Record Number: [id number] Patient Account Number: [id number] Date of Birth/Sex: Treating RN: [birth year] (86 y.o. [name] Primary Care Provider: Marton Matthews Other Clinician: Referring Provider: Treating Provider/Extender: [name] in Treatment: 7 Information Obtained from: Patient Chief Complaint Right foot ulcers s/p Tarsometatarsal Amputation Electronic Signature(s) Signed: 06/02/2020 11:16:37 AM By: Worthy Keeler PA-C Entered By: Worthy Keeler on 06/02/2020 11:16:36 -------------------------------------------------------------------------------- HPI Details Patient Name: Date of Service: [name]. 06/02/2020 11:00 A M Medical Record Number: [id number] Patient Account Number: [id number] Date of Birth/Sex: Treating RN: October 22, 1931 (86 y.o. [name] Primary Care Provider: Marton Matthews Other Clinician: Referring Provider: Treating Provider/Extender: [name] in Treatment: 7 History of Present Illness HPI Description: 04/14/2020 upon evaluation today patient appears to be doing somewhat poorly in regard to her foot ulcers. She has a transmetatarsal amputation site which unfortunately has not healed as effectively as would have been hoped for. She also has a small wound on the lateral portion of her foot which seems to be unrelated necessarily to that and very superficial. Nonetheless the amputation site has a tremendous amount of necrotic tissue there is also 9 sutures remaining at this point. Nonetheless I feel like that the patient is going require debridement of this area she was referred to [place] by Dr. Doran Matthews for evaluation and treatment he has suggested per his note that I reviewed from 03/31/2020 that she may require revision surgery to a  below-knee amputation. With that being said obviously the patient wants to avoid this. I do believe that she may be a candidate for a wound VAC for that reason I am going to perform debridement to remove the sutures as well as necrotic tissue to some degree so that we can get things moving along with regard to the wound VAC as well. Obviously I do not believe that any of the necrotic tissue nor the sutures are really doing much for her as far as healing is concerned. She does have a history of diabetes mellitus type 2 she also has peripheral vascular disease fortunately she was seen by Dr. Trula Matthews on 04/08/2020. He did actually reestablish good blood flow to the limb there was a 10 cm segment of the right superficial femoral artery with a 60% stenosis treated with balloon angioplasty. She also did have a subtotal occlusion to the origin of the right peroneal artery treated with arthrectomy and 3 mm balloon angioplasty there was no residual stenosis the patient does have some left renal artery stenosis. The good news is I do believe she has much better blood flow hopefully this will benefit her from the standpoint of healing. The patient's most recent hemoglobin A1c was 8.3. Unfortunately this all started with an ankle fracture in March subsequent to this she had a cast that was placed on her leg and apparently it was when they remove the cast for the 2nd time that they noticed this area underneath on her foot that led to what was thought to be initially just a 1st and 2nd toe amputation but led to a transmetatarsal amputation. That was on 03/11/2020. 04/21/2020 upon evaluation today patient actually appears to be doing quite well in regard to her foot ulcer all things considered. This is still significant wound there is some tendon exposed which is becoming necrotic and will  need to be removed but other than this overall I feel like she is actually doing quite well. I do believe that wound VAC will  benefit her with that being said we do not have the approval for that she received a box from [facility] but unfortunately is out of network therefore began to get it through a different company. 05/05/2020 on evaluation today patient actually appears to be doing well with regard to her wound. Fortunately there is no signs of active infection at this time which is great news. She does have some slough buildup also she has got the wound VAC started which is excellent she seems to be doing well with this. 05/19/2020 on evaluation today patient's wound actually appears to be doing quite well. The wound VAC is doing an excellent job for her and overall I feel like she is making great progress. There is no sign of active infection at this time which is also great news. The only issue we have is apparently some complication with home health. They are saying that the night may be able to continue to come out of it was somewhat strange as to the reasons why. Nonetheless it appears the patient is in the midst of recertification. The wound VAC is helping and she is getting excellent benefit from the use of it therefore I do not think will have any trouble getting this reapproved. 06/02/2020 upon evaluation today patient appears to be doing fairly well in regard to her foot ulcer. The wound VAC still continues to do well for her in my opinion she has better granulation than even last time I saw her in general I feel like she is headed in the correct direction. There is no sign of active infection at this time which is great news. Electronic Signature(s) Signed: 06/02/2020 2:27:05 PM By: Worthy Keeler PA-C Entered By: Worthy Keeler on 06/02/2020 14:27:05 -------------------------------------------------------------------------------- Physical Exam Details Patient Name: Date of Service: [name], [name] 06/02/2020 11:00 A M Medical Record Number: [id number] Patient Account Number: [id number] Date of Birth/Sex:  Treating RN: [birth year] (86 y.o. [name] Primary Care Provider: Marton Matthews Other Clinician: Referring Provider: Treating Provider/Extender: [name] in Treatment: 7 Notes Patient's wound bed showed signs of excellent granulation for the most part she has completely covered over any structure underneath as far as I can tell. There does not appear to be any exposed to necrotic tendon, bone, or otherwise. In general I am very pleased with how things appear today. Electronic Signature(s) Signed: 06/02/2020 2:27:32 PM By: Worthy Keeler PA-C Entered By: Worthy Keeler on 06/02/2020 14:27:32 -------------------------------------------------------------------------------- Physician Orders Details Patient Name: Date of Service: [name]. 06/02/2020 11:00 A M Medical Record Number: [id number] Patient Account Number: [id number] Date of Birth/Sex: Treating RN: 10-15-1931 (86 y.o. Martyn Malay, Vaughan Basta Primary Care Provider: Marton Matthews Other Clinician: Referring Provider: Treating Provider/Extender: [name] in Treatment: 7 Verbal / Phone Orders: No Diagnosis Coding ICD-10 Coding Code Description E11.621 Type 2 diabetes mellitus with foot ulcer T81.31XA Disruption of external operation (surgical) wound, not elsewhere classified, initial encounter L97.512 Non-pressure chronic ulcer of other part of right foot with fat layer exposed L97.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 I73.89 Other specified peripheral vascular diseases Follow-up Appointments Return appointment in 3 weeks. Dressing Change Frequency Wound #2 Right Amputation Site - Transmetatarsal Change dressing three times week. Skin Barriers/Peri-Wound Care Skin Prep -  under drape Wound Cleansing Wound #2 Right Amputation Site - Transmetatarsal Clean wound with Normal Saline. May shower with protection. Primary  Wound Dressing Other: - dry gauze in clinic Secondary Dressing Wound #2 Right Amputation Site - Transmetatarsal Kerlix/Rolled Gauze - in clinic ABD pad - in clinic Negative Presssure Wound Therapy Wound #2 Right Amputation Site - Transmetatarsal Wound Vac to wound continuously at 167m/hg pressure - home health to apply 3 times per week Black Foam Other: - may use ace wrap to protect Off-Loading Removable cast walker boot to: - right foot to ambulate Other: - minimal weight bearing right foot [facility]. -Jackquline DenmarkElectronic Signature(s) Signed: 06/02/2020 5:04:51 PM By: SWorthy KeelerPA-C Signed: 06/03/2020 2:58:18 PM By: BBaruch GoutyRN, BSN Entered By: BBaruch Goutyon 06/02/2020 12:33:43 -------------------------------------------------------------------------------- Problem List Details Patient Name: Date of Service: LJanell Matthews 06/02/2020 11:00 A M Medical Record Number: [id number] Account Number: [id number] of Birth/Sex: Treating RN: [birth year](86y.o. FMartyn Malay Linda Primary Care Provider: SMarton RedwoodOther Clinician: Referring Provider: Treating Provider/Extender: SJarome Matinin Treatment: 7 Active Problems ICD-10 Encounter Code Description Active Date MDM Diagnosis E11.621 Type 2 diabetes mellitus with foot ulcer 04/14/2020 No Yes T81.31XA Disruption of external operation (surgical) wound, not elsewhere classified, 04/14/2020 No Yes initial encounter L97.512 Non-pressure chronic ulcer of other part of right foot with fat layer exposed 04/14/2020 No Yes L97.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 04/21/2020 No Yes I73.89 Other specified peripheral vascular diseases 04/14/2020 No Yes Inactive Problems Resolved Problems Electronic Signature(s) Signed: 06/02/2020 11:16:31 AM By: SWorthy KeelerPA-C Entered By: SWorthy Keeleron 06/02/2020 11:16:30 -------------------------------------------------------------------------------- Progress Note Details Patient Name: Date of Service: LJanell Matthews 06/02/2020 11:00 A M Medical Record Number: [id number] Account Number: [id number] of Birth/Sex: Treating RN: [birth year](86y.o. FElam DutchPrimary Care Provider: SMarton RedwoodOther Clinician: Referring Provider: Treating Provider/Extender: SJarome Matinin Treatment: 7 Subjective Chief Complaint Information obtained from Patient Right foot ulcers s/p Tarsometatarsal Amputation History of Present Illness (HPI) 04/14/2020 upon evaluation today patient appears to be doing somewhat poorly in regard to her foot ulcers. She has a transmetatarsal amputation site which unfortunately has not healed as effectively as would have been hoped for. She also has a small wound on the lateral portion of her foot which seems to be unrelated necessarily to that and very superficial. Nonetheless the amputation site has a tremendous amount of necrotic tissue there is also 9 sutures remaining at this point. Nonetheless I feel like that the patient is going require debridement of this area she was [place] HDoran Durand[place] evaluation and treatment he has suggested per his note that I reviewed from 03/31/2020 that she may require revision surgery to a below-knee amputation. With that being said obviously the patient wants to avoid this. I do believe that she may be a candidate for a wound VAC for that reason I am going to perform debridement to remove the sutures as well as necrotic tissue to some degree so that we can get things moving along with regard to the wound VAC as well. Obviously I do not believe that any of the necrotic tissue nor the sutures are really doing much for her as far as healing is concerned. She does have a history of diabetes mellitus type 2 she  also has peripheral  vascular disease fortunately she was seen by Dr. Trula Matthews on 04/08/2020. He did actually reestablish good blood flow to the limb there was a 10 cm segment of the right superficial femoral artery with a 60% stenosis treated with balloon angioplasty. She also did have a subtotal occlusion to the origin of the right peroneal artery treated with arthrectomy and 3 mm balloon angioplasty there was no residual stenosis the patient does have some left renal artery stenosis. The good news is I do believe she has much better blood flow hopefully this will benefit her from the standpoint of healing. The patient's most recent hemoglobin A1c was 8.3. Unfortunately this all started with an ankle fracture in March subsequent to this she had a cast that was placed on her leg and apparently it was when they remove the cast for the 2nd time that they noticed this area underneath on her foot that led to what was thought to be initially just a 1st and 2nd toe amputation but led to a transmetatarsal amputation. That was on 03/11/2020. 04/21/2020 upon evaluation today patient actually appears to be doing quite well in regard to her foot ulcer all things considered. This is still significant wound there is some tendon exposed which is becoming necrotic and will need to be removed but other than this overall I feel like she is actually doing quite well. I do believe that wound VAC will benefit her with that being said we do not have the approval for that she received a box from [facility] but unfortunately is out of network therefore began to get it through a different company. 05/05/2020 on evaluation today patient actually appears to be doing well with regard to her wound. Fortunately there is no signs of active infection at this time which is great news. She does have some slough buildup also she has got the wound VAC started which is excellent she seems to be doing well with this. 05/19/2020 on evaluation today patient's wound  actually appears to be doing quite well. The wound VAC is doing an excellent job for her and overall I feel like she is making great progress. There is no sign of active infection at this time which is also great news. The only issue we have is apparently some complication with home health. They are saying that the night may be able to continue to come out of it was somewhat strange as to the reasons why. Nonetheless it appears the patient is in the midst of recertification. The wound VAC is helping and she is getting excellent benefit from the use of it therefore I do not think will have any trouble getting this reapproved. 06/02/2020 upon evaluation today patient appears to be doing fairly well in regard to her foot ulcer. The wound VAC still continues to do well for her in my opinion she has better granulation than even last time I saw her in general I feel like she is headed in the correct direction. There is no sign of active infection at this time which is great news. Objective Constitutional Vitals Time Taken: 11:34 AM, Height: 68 in, Weight: 167 lbs, BMI: 25.4, Temperature: 98.3 F, Pulse: 108 bpm, Respiratory Rate: 18 breaths/min, Blood Pressure: 150/80 mmHg, Capillary Blood Glucose: 140 mg/dl. Integumentary (Hair, Skin) Wound #2 status is Open. Original cause of wound was Surgical Injury. The wound is located on the Right Amputation Site - Transmetatarsal. The wound measures 3.8cm length x 5.1cm width x 0.7cm depth; 15.221cm^2  area and 10.655cm^3 volume. There is Fat Layer (Subcutaneous Tissue) exposed. There is no tunneling or undermining noted. There is a medium amount of serosanguineous drainage noted. The wound margin is flat and intact. There is medium (34- 66%) red, pink granulation within the wound bed. There is a medium (34-66%) amount of necrotic tissue within the wound bed including Adherent Slough. 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 Dressing Change Frequency: Wound #2 Right Amputation Site - Transmetatarsal: Change dressing three times week. Skin Barriers/Peri-Wound Care: Skin Prep - under drape Wound Cleansing: Wound #2 Right Amputation Site - Transmetatarsal: Clean wound with Normal Saline. May shower with protection. Primary Wound Dressing: Other: - dry gauze in clinic Secondary Dressing: Wound #2 Right Amputation Site - Transmetatarsal: Kerlix/Rolled Gauze - in clinic ABD pad - in clinic Negative Presssure Wound Therapy: Wound #2 Right Amputation Site - Transmetatarsal: Wound Vac to wound continuously at 156m/hg pressure - home health to apply 3 times per week Black Foam Other: - may use ace wrap to protect Off-Loading: Removable cast walker boot to: - right foot to ambulate Other: - minimal weight bearing right foot [facility]. I am going to recommend currently that we go and continue with wound care measures as before specifically in regard to the wound VAC therapy which I think has been beneficial for her. The patient is in agreement with plan. 2. I am also can recommend at this time patient continue to monitor for any signs of worsening infection such as increased pain or drainage right now I do not see anything that would be evidence of an issue here. 3. I am also can recommend she continue to try to stay off of this is much as possible I think she has a great job in that regard and I see no complications thus far. We will see patient back for reevaluation in 3 weeks here  in the clinic. If anything worsens or changes patient will contact our office for additional recommendations. Electronic Signature(s) Signed: 06/02/2020 2:28:10 PM By: SWorthy KeelerPA-C Entered By: SWorthy Keeleron 06/02/2020 14:28:09 -------------------------------------------------------------------------------- SuperBill Details Patient Name: Date of Service: LJanell Quiet11/17/2021 Medical Record Number: [id number] Account Number: [id number] of Birth/Sex: Treating RN: [birth year](86y.o. FMartyn Malay Linda Primary Care Provider: SMarton RedwoodOther Clinician: Referring Provider: Treating Provider/Extender: SJarome Matinin Treatment: 7 Diagnosis Coding ICD-10 Codes Code Description [phone] 2 diabetes mellitus with foot ulcer T81.31XA Disruption of external operation (surgical) wound, not elsewhere classified, initial encounter L97.512 Non-pressure chronic ulcer of other part of right foot with fat layer exposed L97.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 I73.89 Other specified peripheral vascular diseases Facility Procedures CPT4 Code: [id number]: 99213 - WOUND CARE VISIT-LEV 3 EST PT Modifier: Quantity: 1 Physician Procedures : CPT4 Code Description Modifier [id number] PHYS LEVEL 4 - EST PT ICD-10 Diagnosis Description E11.621 Type 2 diabetes mellitus with foot ulcer T81.31XA Disruption of external operation (surgical) wound, not elsewhere classified, initial  encounter L97.512 Non-pressure chronic ulcer of other  part of right foot with fat layer exposed L97.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 Quantity: 1 Electronic Signature(s) Signed: 06/02/2020 2:28:34 PM By: Worthy Keeler PA-C Entered By: Worthy Keeler on 06/02/2020 14:28:31

## 2020-06-03 NOTE — Progress Notes (Signed)
VEGAS, COFFIN ([id number]) Visit Report for 06/02/2020 Arrival Information Details Patient Name: Date of Service: [name], [name] 06/02/2020 11:00 A M Medical Record Number: [id number] Patient Account Number: [id number] Date of Birth/Sex: Treating RN: 05/05/1932 (86 y.o. [name] Primary Care [name]: [name] Other Clinician: Referring [name]: Treating [name]/Extender: [name] in Treatment: 7 Visit Information History Since Last Visit Added or deleted any medications: No Patient Arrived: [name] Any new allergies or adverse reactions: No Arrival Time: 11:33 Had a fall or experienced change in No Accompanied By: friend activities of daily living that may affect Transfer Assistance: None risk of falls: Patient Identification Verified: Yes Signs or symptoms of abuse/neglect since last No Secondary Verification Process Completed: Yes visito Patient Requires Transmission-Based No Hospitalized since last visit: No Precautions: Implantable device outside of the clinic No Patient Has Alerts: Yes excluding Patient Alerts: Patient on Blood Thinner cellular tissue based products placed in the center since last visit: Has Dressing in Place as Prescribed: Yes Has Footwear/Offloading in Place as Yes Prescribed: Right: Removable Cast Walker/Walking Boot Pain Present Now: No Electronic Signature(s) Signed: 06/02/2020 3:49:54 PM By: [name] Entered By: [name] on 06/02/2020 11:42:30 -------------------------------------------------------------------------------- Clinic Level of Care Assessment Details Patient Name: Date of Service: [name], [name] 06/02/2020 11:00 A M Medical Record Number: [id number] Patient Account Number: [id number] Date of Birth/Sex: Treating RN: [birth year] (86 y.o. [name] Primary Care Tanayah Squitieri: [name] Other Clinician: Referring [name]: Treating [name]/Extender: [name] in Treatment: 7 Clinic Level of Care Assessment Items TOOL 4 Quantity Score '[]'  - 0 Use when only an EandM is performed on FOLLOW-UP visit ASSESSMENTS - Nursing Assessment / Reassessment X- 1 10 Reassessment of Co-morbidities (includes updates in patient status) X- 1 5 Reassessment of Adherence to Treatment Plan ASSESSMENTS - Wound and Skin A ssessment / Reassessment X - Simple Wound Assessment / Reassessment - one wound 1 5 '[]'  - 0 Complex Wound Assessment / Reassessment - multiple wounds '[]'  - 0 Dermatologic / Skin Assessment (not related to wound area) ASSESSMENTS - Focused Assessment '[]'  - 0 Circumferential Edema Measurements - multi extremities '[]'  - 0 Nutritional Assessment / Counseling / Intervention X- 1 5 Lower Extremity Assessment (monofilament, tuning fork, pulses) '[]'  - 0 Peripheral Arterial Disease Assessment (using hand held doppler) ASSESSMENTS - Ostomy and/or Continence Assessment and Care '[]'  - 0 Incontinence Assessment and Management '[]'  - 0 Ostomy Care Assessment and Management (repouching, etc.) PROCESS - Coordination of Care X - Simple Patient / Family Education for ongoing care 1 15 '[]'  - 0 Complex (extensive) Patient / Family Education for ongoing care X- 1 10 Staff obtains Programmer, systems, Records, T Results / Process Orders est X- 1 10 Staff telephones HHA, Nursing Homes / Clarify orders / etc '[]'  - 0 Routine Transfer to another Facility (non-emergent condition) '[]'  - 0 Routine Hospital Admission (non-emergent condition) '[]'  - 0 New Admissions / Biomedical engineer / Ordering NPWT Apligraf, etc. , '[]'  - 0 Emergency Hospital Admission (emergent condition) X- 1 10 Simple Discharge Coordination '[]'  - 0 Complex (extensive) Discharge Coordination PROCESS - Special Needs '[]'  - 0 Pediatric / Minor Patient Management '[]'  - 0 Isolation Patient Management '[]'  - 0 Hearing / Language / Visual special needs '[]'  - 0 Assessment of Community  assistance (transportation, D/C planning, etc.) '[]'  - 0 Additional assistance / Altered mentation '[]'  - 0 Support Surface(s) Assessment (bed, cushion, seat, etc.) INTERVENTIONS - Wound Cleansing / Measurement X -  Simple Wound Cleansing - one wound 1 5 '[]'  - 0 Complex Wound Cleansing - multiple wounds X- 1 5 Wound Imaging (photographs - any number of wounds) '[]'  - 0 Wound Tracing (instead of photographs) X- 1 5 Simple Wound Measurement - one wound '[]'  - 0 Complex Wound Measurement - multiple wounds INTERVENTIONS - Wound Dressings X - Small Wound Dressing one or multiple wounds 1 10 '[]'  - 0 Medium Wound Dressing one or multiple wounds '[]'  - 0 Large Wound Dressing one or multiple wounds X- 1 5 Application of Medications - topical '[]'  - 0 Application of Medications - injection INTERVENTIONS - Miscellaneous '[]'  - 0 External ear exam '[]'  - 0 Specimen Collection (cultures, biopsies, blood, body fluids, etc.) '[]'  - 0 Specimen(s) / Culture(s) sent or taken to Lab for analysis '[]'  - 0 Patient Transfer (multiple staff / Civil Service fast streamer / Similar devices) '[]'  - 0 Simple Staple / Suture removal (25 or less) '[]'  - 0 Complex Staple / Suture removal (26 or more) '[]'  - 0 Hypo / Hyperglycemic Management (close monitor of Blood Glucose) '[]'  - 0 Ankle / Brachial Index (ABI) - do not check if billed separately X- 1 5 Vital Signs Has the patient been seen at the hospital within the last three years: Yes Total Score: 105 Level Of Care: New/Established - Level 3 Electronic Signature(s) Signed: 06/03/2020 2:58:18 PM By: Baruch Gouty RN, BSN Entered By: Baruch Gouty on 06/02/2020 12:30:06 -------------------------------------------------------------------------------- Encounter Discharge Information Details Patient Name: Date of Service: [name]. 06/02/2020 11:00 A M Medical Record Number: [id number] Patient Account Number: [id number] Date of Birth/Sex: Treating RN: 07-Feb-1932 (86 y.o. [name], [name] Primary Care Alayshia Marini: [name] Other Clinician: Referring Timberlyn Pickford: Treating Byard Carranza/Extender: [name] in Treatment: 7 Encounter Discharge Information Items Discharge Condition: Stable Ambulatory Status: Walker Discharge Destination: Home Transportation: Private Auto Accompanied By: friend Schedule Follow-up Appointment: Yes Clinical Summary of Care: Electronic Signature(s) Signed: 06/02/2020 5:29:55 PM By: Deon Pilling Entered By: Deon Pilling on 06/02/2020 16:05:56 -------------------------------------------------------------------------------- Lower Extremity Assessment Details Patient Name: Date of Service: [name], [name] 06/02/2020 11:00 A M Medical Record Number: [id number] Patient Account Number: [id number] Date of Birth/Sex: Treating RN: 1931/07/20 (86 y.o. [name] Primary Care Haston Casebolt: [name] Other Clinician: Referring Dewitt Judice: Treating Advith Martine/Extender: [name] in Treatment: 7 Edema Assessment Assessed: [Left: No] [Right: Yes] Edema: [Left: Ye] [Right: s] Calf Left: Right: Point of Measurement: 32 cm From Medial Instep 31.5 cm Ankle Left: Right: Point of Measurement: 11 cm From Medial Instep 20.5 cm Vascular Assessment Pulses: Dorsalis Pedis Palpable: [Right:Yes] Electronic Signature(s) Signed: 06/02/2020 3:49:54 PM By: [name] Signed: 06/03/2020 2:58:18 PM By: Baruch Gouty RN, BSN Entered By: [name] on 06/02/2020 11:45:14 -------------------------------------------------------------------------------- Multi-Disciplinary Care Plan Details Patient Name: Date of Service: [name]. 06/02/2020 11:00 A M Medical Record Number: [id number] Patient Account Number: [id number] Date of Birth/Sex: Treating RN: 1931/08/13 (86 y.o. [name] Primary Care Chirsty Armistead: [name] Other Clinician: Referring Venice Marcucci: Treating  Romy Mcgue/Extender: [name] in Treatment: 7 Active Inactive Nutrition Nursing Diagnoses: Impaired glucose control: actual or potential Potential for alteratiion in Nutrition/Potential for imbalanced nutrition Goals: Patient/caregiver will maintain therapeutic glucose control Date Initiated: 04/14/2020 Target Resolution Date: 06/16/2020 Goal Status: Active Interventions: Assess HgA1c results as ordered upon admission and as needed Assess patient nutrition upon admission and as needed per policy Treatment Activities: Patient referred to Primary Care Physician for further nutritional evaluation :  04/14/2020 Notes: Wound/Skin Impairment Nursing Diagnoses: Impaired tissue integrity Knowledge deficit related to ulceration/compromised skin integrity Goals: Patient/caregiver will verbalize understanding of skin care regimen Date Initiated: 04/14/2020 Target Resolution Date: 06/16/2020 Goal Status: Active Ulcer/skin breakdown will have a volume reduction of 30% by week 4 Date Initiated: 04/14/2020 Date Inactivated: 05/19/2020 Target Resolution Date: 05/12/2020 Goal Status: Met Ulcer/skin breakdown will have a volume reduction of 50% by week 8 Date Initiated: 05/19/2020 Target Resolution Date: 06/16/2020 Goal Status: Active Interventions: Assess patient/caregiver ability to obtain necessary supplies Assess patient/caregiver ability to perform ulcer/skin care regimen upon admission and as needed Assess ulceration(s) every visit Provide education on ulcer and skin care Treatment Activities: Skin care regimen initiated : 04/14/2020 Topical wound management initiated : 04/14/2020 Notes: Electronic Signature(s) Signed: 06/03/2020 2:58:18 PM By: Baruch Gouty RN, BSN Entered By: Baruch Gouty on 06/02/2020 12:26:34 -------------------------------------------------------------------------------- Pain Assessment Details Patient Name: Date of Service: [name]. 06/02/2020 11:00 A M Medical Record Number: [id number] Patient Account Number: [id number] Date of Birth/Sex: Treating RN: [birth year] (85 y.o. [name] Primary Care Ilaria Much: [name] Other Clinician: Referring Haden Suder: Treating Zygmund Passero/Extender: [name] in Treatment: 7 Active Problems Location of Pain Severity and Description of Pain Patient Has Paino No Site Locations Rate the pain. Current Pain Level: 0 Pain Management and Medication Current Pain Management: Medication: No Cold Application: No Rest: No Massage: No Activity: No T.E.N.S.: No Heat Application: No Leg drop or elevation: No Is the Current Pain Management Adequate: Adequate How does your wound impact your activities of daily livingo Sleep: No Bathing: No Appetite: No Relationship With Others: No Bladder Continence: No Emotions: No Bowel Continence: No Work: No Toileting: No Drive: No Dressing: No Hobbies: No Electronic Signature(s) Signed: 06/02/2020 3:49:54 PM By: [name] Signed: 06/03/2020 2:58:18 PM By: Baruch Gouty RN, BSN Entered By: [name] on 06/02/2020 11:43:06 -------------------------------------------------------------------------------- Patient/Caregiver Education Details Patient Name: Date of Service: [name] 11/17/2021andnbsp11:00 A M Medical Record Number: [id number] Patient Account Number: [id number] Date of Birth/Gender: Treating RN: Jan 25, 1932 (86 y.o. [name] Primary Care Physician: [name] Other Clinician: Referring Physician: Treating Physician/Extender: [name] in Treatment: 7 Education Assessment Education Provided To: Patient Education Topics Provided Offloading: Methods: Explain/Verbal Responses: Reinforcements needed, State content correctly Tissue Oxygenation: Methods: Explain/Verbal Responses: Reinforcements needed, State content  correctly Wound/Skin Impairment: Methods: Explain/Verbal Responses: Reinforcements needed, State content correctly Electronic Signature(s) Signed: 06/03/2020 2:58:18 PM By: Baruch Gouty RN, BSN Entered By: Baruch Gouty on 06/02/2020 12:27:20 -------------------------------------------------------------------------------- Wound Assessment Details Patient Name: Date of Service: [name]. 06/02/2020 11:00 A M Medical Record Number: [id number] Patient Account Number: [id number] Date of Birth/Sex: Treating RN: 04/14/1932 (86 y.o. Martyn Malay, Linda Primary Care Pascual Mantel: [name] Other Clinician: Referring Keiffer Piper: Treating Mort Smelser/Extender: [name] in Treatment: 7 Wound Status Wound Number: 2 Primary Open Surgical Wound Etiology: Wound Location: Right Amputation Site - Transmetatarsal Secondary Arterial Insufficiency Ulcer Wounding Event: Surgical Injury Etiology: Date Acquired: 03/11/2020 Wound Open Weeks Of Treatment: 7 Status: Clustered Wound: No Comorbid Congestive Heart Failure, Deep Vein Thrombosis, Hypertension, History: Peripheral Arterial Disease, Peripheral Venous Disease, Type II Diabetes, Neuropathy Photos Photo Uploaded By: Mikeal Hawthorne on 06/03/2020 09:06:28 Wound Measurements Length: (cm) 3.8 Width: (cm) 5.1 Depth: (cm) 0.7 Area: (cm) 15.221 Volume: (cm) 10.655 % Reduction in Area: 52.2% % Reduction in Volume: 72.1% Epithelialization: Medium (34-66%) Tunneling: No Undermining: No Wound Description Classification: Full Thickness Without Exposed Support Struct  Wound Margin: Flat and Intact Exudate Amount: Medium Exudate Type: Serosanguineous Exudate Color: red, brown ures Foul Odor After Cleansing: No Slough/Fibrino Yes Wound Bed Granulation Amount: Medium (34-66%) Exposed Structure Granulation Quality: Red, Pink Fascia Exposed: No Necrotic Amount: Medium (34-66%) Fat Layer (Subcutaneous Tissue)  Exposed: Yes Necrotic Quality: Adherent Slough Tendon Exposed: No Muscle Exposed: No Joint Exposed: No Bone Exposed: No Treatment Notes Wound #2 (Right Amputation Site - Transmetatarsal) 1. Cleanse With Wound Cleanser 3. Primary Dressing Applied Dry Gauze 4. Secondary Dressing Roll Gauze 5. Secured With Medipore tape 7. Footwear/Offloading device applied Removable Cast Walker/Walking Boot Notes netting. Electronic Signature(s) Signed: 06/02/2020 3:49:54 PM By: [name] Signed: 06/03/2020 2:58:18 PM By: Baruch Gouty RN, BSN Entered By: [name] on 06/02/2020 11:44:45 -------------------------------------------------------------------------------- Vitals Details Patient Name: Date of Service: [name]. 06/02/2020 11:00 A M Medical Record Number: [id number] Patient Account Number: [id number] Date of Birth/Sex: Treating RN: 12-Aug-1931 (86 y.o. [name] Primary Care Eliasar Hlavaty: [name] Other Clinician: Referring Anthonette Lesage: Treating Blayne Garlick/Extender: [name] in Treatment: 7 Vital Signs Time Taken: 11:34 Temperature (F): 98.3 Height (in): 68 Pulse (bpm): 108 Weight (lbs): 167 Respiratory Rate (breaths/min): 18 Body Mass Index (BMI): 25.4 Blood Pressure (mmHg): 150/80 Capillary Blood Glucose (mg/dl): 140 Reference Range: 80 - 120 mg / dl Electronic Signature(s) Signed: 06/02/2020 3:49:54 PM By: [name] Entered By: [name] on 06/02/2020 11:42:55

## 2020-06-04 DIAGNOSIS — T8131XA Disruption of external operation (surgical) wound, not elsewhere classified, initial encounter: Secondary | ICD-10-CM | POA: Diagnosis not present

## 2020-06-04 DIAGNOSIS — E11621 Type 2 diabetes mellitus with foot ulcer: Secondary | ICD-10-CM | POA: Diagnosis not present

## 2020-06-08 DIAGNOSIS — I2510 Atherosclerotic heart disease of native coronary artery without angina pectoris: Secondary | ICD-10-CM | POA: Diagnosis not present

## 2020-06-08 DIAGNOSIS — E039 Hypothyroidism, unspecified: Secondary | ICD-10-CM | POA: Diagnosis not present

## 2020-06-08 DIAGNOSIS — E049 Nontoxic goiter, unspecified: Secondary | ICD-10-CM | POA: Diagnosis not present

## 2020-06-08 DIAGNOSIS — Z8701 Personal history of pneumonia (recurrent): Secondary | ICD-10-CM | POA: Diagnosis not present

## 2020-06-08 DIAGNOSIS — Z4781 Encounter for orthopedic aftercare following surgical amputation: Secondary | ICD-10-CM | POA: Diagnosis not present

## 2020-06-08 DIAGNOSIS — E782 Mixed hyperlipidemia: Secondary | ICD-10-CM | POA: Diagnosis not present

## 2020-06-08 DIAGNOSIS — J9691 Respiratory failure, unspecified with hypoxia: Secondary | ICD-10-CM | POA: Diagnosis not present

## 2020-06-08 DIAGNOSIS — I110 Hypertensive heart disease with heart failure: Secondary | ICD-10-CM | POA: Diagnosis not present

## 2020-06-08 DIAGNOSIS — I5032 Chronic diastolic (congestive) heart failure: Secondary | ICD-10-CM | POA: Diagnosis not present

## 2020-06-08 DIAGNOSIS — Z7902 Long term (current) use of antithrombotics/antiplatelets: Secondary | ICD-10-CM | POA: Diagnosis not present

## 2020-06-08 DIAGNOSIS — E1151 Type 2 diabetes mellitus with diabetic peripheral angiopathy without gangrene: Secondary | ICD-10-CM | POA: Diagnosis not present

## 2020-06-08 DIAGNOSIS — D649 Anemia, unspecified: Secondary | ICD-10-CM | POA: Diagnosis not present

## 2020-06-08 DIAGNOSIS — Z794 Long term (current) use of insulin: Secondary | ICD-10-CM | POA: Diagnosis not present

## 2020-06-08 DIAGNOSIS — Z9181 History of falling: Secondary | ICD-10-CM | POA: Diagnosis not present

## 2020-06-08 DIAGNOSIS — K219 Gastro-esophageal reflux disease without esophagitis: Secondary | ICD-10-CM | POA: Diagnosis not present

## 2020-06-11 DIAGNOSIS — E0590 Thyrotoxicosis, unspecified without thyrotoxic crisis or storm: Secondary | ICD-10-CM | POA: Diagnosis not present

## 2020-06-11 DIAGNOSIS — E1129 Type 2 diabetes mellitus with other diabetic kidney complication: Secondary | ICD-10-CM | POA: Diagnosis not present

## 2020-06-11 DIAGNOSIS — E785 Hyperlipidemia, unspecified: Secondary | ICD-10-CM | POA: Diagnosis not present

## 2020-06-16 DIAGNOSIS — E039 Hypothyroidism, unspecified: Secondary | ICD-10-CM | POA: Diagnosis not present

## 2020-06-16 DIAGNOSIS — Z7902 Long term (current) use of antithrombotics/antiplatelets: Secondary | ICD-10-CM | POA: Diagnosis not present

## 2020-06-16 DIAGNOSIS — E049 Nontoxic goiter, unspecified: Secondary | ICD-10-CM | POA: Diagnosis not present

## 2020-06-16 DIAGNOSIS — Z4781 Encounter for orthopedic aftercare following surgical amputation: Secondary | ICD-10-CM | POA: Diagnosis not present

## 2020-06-16 DIAGNOSIS — D649 Anemia, unspecified: Secondary | ICD-10-CM | POA: Diagnosis not present

## 2020-06-16 DIAGNOSIS — Z9181 History of falling: Secondary | ICD-10-CM | POA: Diagnosis not present

## 2020-06-16 DIAGNOSIS — I2510 Atherosclerotic heart disease of native coronary artery without angina pectoris: Secondary | ICD-10-CM | POA: Diagnosis not present

## 2020-06-16 DIAGNOSIS — E782 Mixed hyperlipidemia: Secondary | ICD-10-CM | POA: Diagnosis not present

## 2020-06-16 DIAGNOSIS — K219 Gastro-esophageal reflux disease without esophagitis: Secondary | ICD-10-CM | POA: Diagnosis not present

## 2020-06-16 DIAGNOSIS — E1151 Type 2 diabetes mellitus with diabetic peripheral angiopathy without gangrene: Secondary | ICD-10-CM | POA: Diagnosis not present

## 2020-06-16 DIAGNOSIS — I110 Hypertensive heart disease with heart failure: Secondary | ICD-10-CM | POA: Diagnosis not present

## 2020-06-16 DIAGNOSIS — J9691 Respiratory failure, unspecified with hypoxia: Secondary | ICD-10-CM | POA: Diagnosis not present

## 2020-06-16 DIAGNOSIS — I5032 Chronic diastolic (congestive) heart failure: Secondary | ICD-10-CM | POA: Diagnosis not present

## 2020-06-16 DIAGNOSIS — Z8701 Personal history of pneumonia (recurrent): Secondary | ICD-10-CM | POA: Diagnosis not present

## 2020-06-16 DIAGNOSIS — Z794 Long term (current) use of insulin: Secondary | ICD-10-CM | POA: Diagnosis not present

## 2020-06-17 ENCOUNTER — Other Ambulatory Visit: Payer: Self-pay

## 2020-06-17 ENCOUNTER — Other Ambulatory Visit (HOSPITAL_COMMUNITY)
Admission: RE | Admit: 2020-06-17 | Discharge: 2020-06-17 | Disposition: A | Discharge status: Home / Self Care | Payer: HMO | Attending: Internal Medicine | Admitting: Internal Medicine

## 2020-06-17 ENCOUNTER — Encounter (HOSPITAL_BASED_OUTPATIENT_CLINIC_OR_DEPARTMENT_OTHER): Payer: HMO | Attending: Internal Medicine | Admitting: Internal Medicine

## 2020-06-17 DIAGNOSIS — E039 Hypothyroidism, unspecified: Secondary | ICD-10-CM | POA: Diagnosis present

## 2020-06-17 DIAGNOSIS — L97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: Secondary | ICD-10-CM | POA: Diagnosis not present

## 2020-06-17 DIAGNOSIS — L089 Local infection of the skin and subcutaneous tissue, unspecified: Secondary | ICD-10-CM | POA: Diagnosis not present

## 2020-06-17 DIAGNOSIS — L03116 Cellulitis of left lower limb: Secondary | ICD-10-CM | POA: Diagnosis present

## 2020-06-17 DIAGNOSIS — L97516 Non-pressure chronic ulcer of other part of right foot with bone involvement without evidence of necrosis: Secondary | ICD-10-CM | POA: Diagnosis present

## 2020-06-17 DIAGNOSIS — E1151 Type 2 diabetes mellitus with diabetic peripheral angiopathy without gangrene: Secondary | ICD-10-CM | POA: Diagnosis present

## 2020-06-17 DIAGNOSIS — Z4781 Encounter for orthopedic aftercare following surgical amputation: Secondary | ICD-10-CM | POA: Diagnosis not present

## 2020-06-17 DIAGNOSIS — M6281 Muscle weakness (generalized): Secondary | ICD-10-CM | POA: Diagnosis not present

## 2020-06-17 DIAGNOSIS — M7989 Other specified soft tissue disorders: Secondary | ICD-10-CM | POA: Diagnosis not present

## 2020-06-17 DIAGNOSIS — L97512 Non-pressure chronic ulcer of other part of right foot with fat layer exposed: Secondary | ICD-10-CM | POA: Diagnosis not present

## 2020-06-17 DIAGNOSIS — E049 Nontoxic goiter, unspecified: Secondary | ICD-10-CM | POA: Diagnosis not present

## 2020-06-17 DIAGNOSIS — S86011A Strain of right Achilles tendon, initial encounter: Secondary | ICD-10-CM | POA: Diagnosis not present

## 2020-06-17 DIAGNOSIS — Z1331 Encounter for screening for depression: Secondary | ICD-10-CM | POA: Diagnosis not present

## 2020-06-17 DIAGNOSIS — I25119 Atherosclerotic heart disease of native coronary artery with unspecified angina pectoris: Secondary | ICD-10-CM | POA: Diagnosis not present

## 2020-06-17 DIAGNOSIS — Z7401 Bed confinement status: Secondary | ICD-10-CM | POA: Diagnosis not present

## 2020-06-17 DIAGNOSIS — E118 Type 2 diabetes mellitus with unspecified complications: Secondary | ICD-10-CM | POA: Diagnosis not present

## 2020-06-17 DIAGNOSIS — E785 Hyperlipidemia, unspecified: Secondary | ICD-10-CM | POA: Diagnosis not present

## 2020-06-17 DIAGNOSIS — Z9889 Other specified postprocedural states: Secondary | ICD-10-CM | POA: Diagnosis not present

## 2020-06-17 DIAGNOSIS — E11621 Type 2 diabetes mellitus with foot ulcer: Secondary | ICD-10-CM | POA: Insufficient documentation

## 2020-06-17 DIAGNOSIS — E1129 Type 2 diabetes mellitus with other diabetic kidney complication: Secondary | ICD-10-CM | POA: Diagnosis not present

## 2020-06-17 DIAGNOSIS — Z7902 Long term (current) use of antithrombotics/antiplatelets: Secondary | ICD-10-CM | POA: Diagnosis not present

## 2020-06-17 DIAGNOSIS — I739 Peripheral vascular disease, unspecified: Secondary | ICD-10-CM | POA: Diagnosis not present

## 2020-06-17 DIAGNOSIS — I70261 Atherosclerosis of native arteries of extremities with gangrene, right leg: Secondary | ICD-10-CM | POA: Diagnosis not present

## 2020-06-17 DIAGNOSIS — I110 Hypertensive heart disease with heart failure: Secondary | ICD-10-CM | POA: Diagnosis present

## 2020-06-17 DIAGNOSIS — F339 Major depressive disorder, recurrent, unspecified: Secondary | ICD-10-CM | POA: Diagnosis not present

## 2020-06-17 DIAGNOSIS — K219 Gastro-esophageal reflux disease without esophagitis: Secondary | ICD-10-CM | POA: Diagnosis present

## 2020-06-17 DIAGNOSIS — I7389 Other specified peripheral vascular diseases: Secondary | ICD-10-CM | POA: Insufficient documentation

## 2020-06-17 DIAGNOSIS — I2510 Atherosclerotic heart disease of native coronary artery without angina pectoris: Secondary | ICD-10-CM | POA: Diagnosis present

## 2020-06-17 DIAGNOSIS — Z955 Presence of coronary angioplasty implant and graft: Secondary | ICD-10-CM | POA: Diagnosis not present

## 2020-06-17 DIAGNOSIS — Z8249 Family history of ischemic heart disease and other diseases of the circulatory system: Secondary | ICD-10-CM | POA: Diagnosis not present

## 2020-06-17 DIAGNOSIS — Z79899 Other long term (current) drug therapy: Secondary | ICD-10-CM | POA: Diagnosis not present

## 2020-06-17 DIAGNOSIS — N182 Chronic kidney disease, stage 2 (mild): Secondary | ICD-10-CM | POA: Diagnosis not present

## 2020-06-17 DIAGNOSIS — Z89511 Acquired absence of right leg below knee: Secondary | ICD-10-CM | POA: Diagnosis not present

## 2020-06-17 DIAGNOSIS — I5032 Chronic diastolic (congestive) heart failure: Secondary | ICD-10-CM | POA: Diagnosis present

## 2020-06-17 DIAGNOSIS — T8131XA Disruption of external operation (surgical) wound, not elsewhere classified, initial encounter: Secondary | ICD-10-CM | POA: Diagnosis present

## 2020-06-17 DIAGNOSIS — I5022 Chronic systolic (congestive) heart failure: Secondary | ICD-10-CM | POA: Diagnosis not present

## 2020-06-17 DIAGNOSIS — M2550 Pain in unspecified joint: Secondary | ICD-10-CM | POA: Diagnosis not present

## 2020-06-17 DIAGNOSIS — Z833 Family history of diabetes mellitus: Secondary | ICD-10-CM | POA: Diagnosis not present

## 2020-06-17 DIAGNOSIS — M869 Osteomyelitis, unspecified: Secondary | ICD-10-CM | POA: Diagnosis present

## 2020-06-17 DIAGNOSIS — Z20822 Contact with and (suspected) exposure to covid-19: Secondary | ICD-10-CM | POA: Diagnosis present

## 2020-06-17 DIAGNOSIS — E1159 Type 2 diabetes mellitus with other circulatory complications: Secondary | ICD-10-CM | POA: Diagnosis not present

## 2020-06-17 DIAGNOSIS — M86672 Other chronic osteomyelitis, left ankle and foot: Secondary | ICD-10-CM | POA: Diagnosis not present

## 2020-06-17 DIAGNOSIS — Z89431 Acquired absence of right foot: Secondary | ICD-10-CM | POA: Diagnosis not present

## 2020-06-17 DIAGNOSIS — E1140 Type 2 diabetes mellitus with diabetic neuropathy, unspecified: Secondary | ICD-10-CM | POA: Diagnosis not present

## 2020-06-17 DIAGNOSIS — E782 Mixed hyperlipidemia: Secondary | ICD-10-CM | POA: Diagnosis present

## 2020-06-17 DIAGNOSIS — Z66 Do not resuscitate: Secondary | ICD-10-CM | POA: Diagnosis present

## 2020-06-17 DIAGNOSIS — L97919 Non-pressure chronic ulcer of unspecified part of right lower leg with unspecified severity: Secondary | ICD-10-CM | POA: Diagnosis not present

## 2020-06-17 DIAGNOSIS — F32A Depression, unspecified: Secondary | ICD-10-CM | POA: Diagnosis present

## 2020-06-17 DIAGNOSIS — Z87891 Personal history of nicotine dependence: Secondary | ICD-10-CM | POA: Diagnosis not present

## 2020-06-17 DIAGNOSIS — R262 Difficulty in walking, not elsewhere classified: Secondary | ICD-10-CM | POA: Diagnosis not present

## 2020-06-17 DIAGNOSIS — E1169 Type 2 diabetes mellitus with other specified complication: Secondary | ICD-10-CM | POA: Diagnosis not present

## 2020-06-17 DIAGNOSIS — R5381 Other malaise: Secondary | ICD-10-CM | POA: Diagnosis not present

## 2020-06-17 DIAGNOSIS — Z794 Long term (current) use of insulin: Secondary | ICD-10-CM | POA: Diagnosis not present

## 2020-06-17 DIAGNOSIS — Z7982 Long term (current) use of aspirin: Secondary | ICD-10-CM | POA: Diagnosis not present

## 2020-06-17 DIAGNOSIS — Z1339 Encounter for screening examination for other mental health and behavioral disorders: Secondary | ICD-10-CM | POA: Diagnosis not present

## 2020-06-17 DIAGNOSIS — I130 Hypertensive heart and chronic kidney disease with heart failure and stage 1 through stage 4 chronic kidney disease, or unspecified chronic kidney disease: Secondary | ICD-10-CM | POA: Diagnosis not present

## 2020-06-17 DIAGNOSIS — S91309A Unspecified open wound, unspecified foot, initial encounter: Secondary | ICD-10-CM | POA: Diagnosis not present

## 2020-06-17 DIAGNOSIS — J9601 Acute respiratory failure with hypoxia: Secondary | ICD-10-CM | POA: Diagnosis not present

## 2020-06-17 DIAGNOSIS — T148XXA Other injury of unspecified body region, initial encounter: Secondary | ICD-10-CM | POA: Diagnosis present

## 2020-06-17 DIAGNOSIS — I10 Essential (primary) hypertension: Secondary | ICD-10-CM | POA: Diagnosis not present

## 2020-06-17 DIAGNOSIS — Z0000 Encounter for general adult medical examination without abnormal findings: Secondary | ICD-10-CM | POA: Diagnosis not present

## 2020-06-17 DIAGNOSIS — E11628 Type 2 diabetes mellitus with other skin complications: Secondary | ICD-10-CM | POA: Diagnosis present

## 2020-06-17 NOTE — Progress Notes (Signed)
CADINCE, HILSCHER ([id number]) Visit Report for 06/17/2020 HPI Details Patient Name: Date of Service: [name], [name] 06/17/2020 8:15 A M Medical Record Number: [id number] Patient Account Number: [id number] Date of Birth/Sex: Treating RN: 09-17-1931 (86 y.o. [name] Primary Care Provider: Marton Redwood Other Clinician: Referring Provider: Treating Provider/Extender: Estelle Grumbles in Treatment: 9 History of Present Illness HPI Description: 04/14/2020 upon evaluation today patient appears to be doing somewhat poorly in regard to her foot ulcers. She has a transmetatarsal amputation site which unfortunately has not healed as effectively as would have been hoped for. She also has a small wound on the lateral portion of her foot which seems to be unrelated necessarily to that and very superficial. Nonetheless the amputation site has a tremendous amount of necrotic tissue there is also 9 sutures remaining at this point. Nonetheless I feel like that the patient is going require debridement of this area she was referred to [place] by Dr. Doran Durand for evaluation and treatment he has suggested per his note that I reviewed from 03/31/2020 that she may require revision surgery to a below-knee amputation. With that being said obviously the patient wants to avoid this. I do believe that she may be a candidate for a wound VAC for that reason I am going to perform debridement to remove the sutures as well as necrotic tissue to some degree so that we can get things moving along with regard to the wound VAC as well. Obviously I do not believe that any of the necrotic tissue nor the sutures are really doing much for her as far as healing is concerned. She does have a history of diabetes mellitus type 2 she also has peripheral vascular disease fortunately she was seen by Dr. Trula Slade on 04/08/2020. He did actually reestablish good blood flow to the limb there was a 10 cm segment of the right  superficial femoral artery with a 60% stenosis treated with balloon angioplasty. She also did have a subtotal occlusion to the origin of the right peroneal artery treated with arthrectomy and 3 mm balloon angioplasty there was no residual stenosis the patient does have some left renal artery stenosis. The good news is I do believe she has much better blood flow hopefully this will benefit her from the standpoint of healing. The patient's most recent hemoglobin A1c was 8.3. Unfortunately this all started with an ankle fracture in March subsequent to this she had a cast that was placed on her leg and apparently it was when they remove the cast for the 2nd time that they noticed this area underneath on her foot that led to what was thought to be initially just a 1st and 2nd toe amputation but led to a transmetatarsal amputation. That was on 03/11/2020. 04/21/2020 upon evaluation today patient actually appears to be doing quite well in regard to her foot ulcer all things considered. This is still significant wound there is some tendon exposed which is becoming necrotic and will need to be removed but other than this overall I feel like she is actually doing quite well. I do believe that wound VAC will benefit her with that being said we do not have the approval for that she received a box from [facility] but unfortunately is out of network therefore began to get it through a different company. 05/05/2020 on evaluation today patient actually appears to be doing well with regard to her wound. Fortunately there is no signs of active infection at this time which  is great news. She does have some slough buildup also she has got the wound VAC started which is excellent she seems to be doing well with this. 05/19/2020 on evaluation today patient's wound actually appears to be doing quite well. The wound VAC is doing an excellent job for her and overall I feel like she is making great progress. There is no sign of active  infection at this time which is also great news. The only issue we have is apparently some complication with home health. They are saying that the night may be able to continue to come out of it was somewhat strange as to the reasons why. Nonetheless it appears the patient is in the midst of recertification. The wound VAC is helping and she is getting excellent benefit from the use of it therefore I do not think will have any trouble getting this reapproved. 06/02/2020 upon evaluation today patient appears to be doing fairly well in regard to her foot ulcer. The wound VAC still continues to do well for her in my opinion she has better granulation than even last time I saw her in general I feel like she is headed in the correct direction. There is no sign of active infection at this time which is great news. 12/2; this is a patient with type 2 diabetes who is undergoing a right transmetatarsal amputation wound on the plantar foot which we have been treating with a wound VAC. She had been making nice progress. She has home health changing the VAC. The patient lives alone she does not really have a good sense of the wound or anything around this. Apparently home health noted increasing drainage redness and swelling proximal to the wound on the plantar foot. Apparently they tried to get antibiotics through primary care, Dr. Nona Dell office [presumably he did the amputation] but nobody would order these. Jeri Cos started antibiotics on Monday. There was not a slot for her to be seen yesterday. There is some report of the ill alarm going off on her back she took the [facility] off this morning. She has not been systemically unwell. The patient also has significant PAD status post revascularization Electronic Signature(s) Signed: 06/17/2020 12:52:04 PM By: Linton Ham MD Entered By: Linton Ham on 06/17/2020  09:06:55 -------------------------------------------------------------------------------- Physical Exam Details Patient Name: Date of Service: [name] 06/17/2020 8:15 A M Medical Record Number: [id number] Patient Account Number: [id number] Date of Birth/Sex: Treating RN: [birth year] (86 y.o. [name] Primary Care Provider: Marton Redwood Other Clinician: Referring Provider: Treating Provider/Extender: Estelle Grumbles in Treatment: 9 Constitutional Sitting or standing Blood Pressure is within target range for patient.. Pulse regular and within target range for patient.Marland Kitchen Respirations regular, non-labored and within target range.Marland Kitchen Appears in no distress. Notes Wound exam; the wound itself does not look too bad healthy granulation. I carefully used a Q-tip to see if there was any probing depth I could not determine any. There was no purulence. The real concerning area is proximal from the wound there is erythema and some swelling and even through her neuropathy some tenderness. Electronic Signature(s) Signed: 06/17/2020 12:52:04 PM By: Linton Ham MD Entered By: Linton Ham on 06/17/2020 09:08:37 -------------------------------------------------------------------------------- Physician Orders Details Patient Name: Date of Service: [name]. 06/17/2020 8:15 A M Medical Record Number: [id number] Patient Account Number: [id number] Date of Birth/Sex: Treating RN: 1931-10-16 (86 y.o. [name], [name] Primary Care Provider: Marton Redwood Other Clinician: Referring Provider: Treating Provider/Extender: Estelle Grumbles  in Treatment: 9 Verbal / Phone Orders: No Diagnosis Coding ICD-10 Coding Code Description E11.621 Type 2 diabetes mellitus with foot ulcer T81.31XA Disruption of external operation (surgical) wound, not elsewhere classified, initial encounter L97.512 Non-pressure chronic ulcer of other part of right foot with fat  layer exposed L97.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 I73.89 Other specified peripheral vascular diseases Follow-up Appointments ppointment in 1 week. - with Margarita Grizzle, [place] on Wednesday. Return A Bathing/ Shower/ Hygiene May shower with protection but do not get wound dressing(s) wet. Negative Presssure Wound Therapy Other: - hold vac for now. Will evaluate next week. Off-Loading Removable cast walker boot to: - right foot with walking. Other: - minimal weight bearing to right foot. Additional Orders / Instructions Other: - Patient to pick up additional three days of antibiotics from Pharmacy. ***If this worsens increasing redness, more pain, fever, chills patient to go to the emergency department.*** Home Health New wound care orders this week; continue Home Health for wound care. May utilize formulary equivalent dressing for wound treatment orders unless otherwise specified. [facility] [place] home health to change dressing three times a week. Home Health nurse to call wound center Friday after dressing change to update wound center with patient wound status. [phone] Dressing changes to be completed by [place] on Monday / Wednesday / Friday except when patient has scheduled visit at [facility]. Wound Treatment Wound #2 - Amputation Site - Transmetatarsal Wound Laterality: Right Cleanser: Normal Saline (Home Health) 3 x Per Week/15 Days Discharge Instructions: Cleanse the wound with Normal Saline prior to applying a clean dressing using gauze sponges, not tissue or cotton balls. Prim Dressing: KerraCel Ag Gelling Fiber Dressing, 4x5 in (silver alginate) (Home Health) 3 x Per Week/15 Days ary Discharge Instructions: Apply silver alginate to wound bed as instructed Secondary Dressing: ABD Pad, 5x9 ([place]) 3 x Per Week/15 Days Discharge Instructions: Apply over primary dressing as directed. Secured With: Elastic Bandage 4 inch (ACE  bandage) (Home Health) 3 x Per Week/15 Days Discharge Instructions: Secure with ACE bandage as directed. Secured With: [employer], 4.5x3.1 (in/yd) (Home Health) 3 x Per Week/15 Days Discharge Instructions: Secure with Kerlix as directed. Laboratory naerobe culture (MICRO) - culture right transmet foot wound. Bacteria identified in Unspecified specimen by A LOINC Code: 250-5 Convenience Name: Anerobic culture Patient Medications llergies: Sulfa (Sulfonamide Antibiotics) A Notifications Medication Indication Start End celllulitis 06/21/2020 doxycycline monohydrate DOSE oral 100 mg capsule - 1 capsule oral bid for a further 3 days (continuing rx) Electronic Signature(s) Signed: 06/17/2020 12:52:04 PM By: Linton Ham MD Signed: 06/17/2020 5:54:37 PM By: Deon Pilling Previous Signature: 06/17/2020 9:14:47 AM Version By: Linton Ham MD Entered By: Deon Pilling on 06/17/2020 09:29:02 -------------------------------------------------------------------------------- Problem List Details Patient Name: Date of Service: [name]. 06/17/2020 8:15 A M Medical Record Number: [id number] Patient Account Number: [id number] Date of Birth/Sex: Treating RN: 20-Jul-1931 (86 y.o. [name], [name] Primary Care Provider: Marton Redwood Other Clinician: Referring Provider: Treating Provider/Extender: Estelle Grumbles in Treatment: 9 Active Problems ICD-10 Encounter Code Description Active Date MDM Diagnosis E11.621 Type 2 diabetes mellitus with foot ulcer 04/14/2020 No Yes T81.31XA Disruption of external operation (surgical) wound, not elsewhere classified, 04/14/2020 No Yes initial encounter L97.512 Non-pressure chronic ulcer of other part of right foot with fat layer exposed 04/14/2020 No Yes L97.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 04/21/2020 No Yes I73.89 Other specified  peripheral vascular diseases 04/14/2020 No  Yes L03.116 Cellulitis of left lower limb 06/17/2020 No Yes Inactive Problems Resolved Problems Electronic Signature(s) Signed: 06/17/2020 12:52:04 PM By: Linton Ham MD Entered By: Linton Ham on 06/17/2020 09:04:25 -------------------------------------------------------------------------------- Progress Note Details Patient Name: Date of Service: [name]. 06/17/2020 8:15 A M Medical Record Number: [id number] Patient Account Number: [id number] Date of Birth/Sex: Treating RN: 1932-04-25 (86 y.o. [name] Primary Care Provider: Marton Redwood Other Clinician: Referring Provider: Treating Provider/Extender: Estelle Grumbles in Treatment: 9 Subjective History of Present Illness (HPI) 04/14/2020 upon evaluation today patient appears to be doing somewhat poorly in regard to her foot ulcers. She has a transmetatarsal amputation site which unfortunately has not healed as effectively as would have been hoped for. She also has a small wound on the lateral portion of her foot which seems to be unrelated necessarily to that and very superficial. Nonetheless the amputation site has a tremendous amount of necrotic tissue there is also 9 sutures remaining at this point. Nonetheless I feel like that the patient is going require debridement of this area she was referred to [place] by Dr. Doran Durand for evaluation and treatment he has suggested per his note that I reviewed from 03/31/2020 that she may require revision surgery to a below-knee amputation. With that being said obviously the patient wants to avoid this. I do believe that she may be a candidate for a wound VAC for that reason I am going to perform debridement to remove the sutures as well as necrotic tissue to some degree so that we can get things moving along with regard to the wound VAC as well. Obviously I do not believe that any of the necrotic tissue nor the sutures are really doing much for her as far as healing  is concerned. She does have a history of diabetes mellitus type 2 she also has peripheral vascular disease fortunately she was seen by Dr. Trula Slade on 04/08/2020. He did actually reestablish good blood flow to the limb there was a 10 cm segment of the right superficial femoral artery with a 60% stenosis treated with balloon angioplasty. She also did have a subtotal occlusion to the origin of the right peroneal artery treated with arthrectomy and 3 mm balloon angioplasty there was no residual stenosis the patient does have some left renal artery stenosis. The good news is I do believe she has much better blood flow hopefully this will benefit her from the standpoint of healing. The patient's most recent hemoglobin A1c was 8.3. Unfortunately this all started with an ankle fracture in March subsequent to this she had a cast that was placed on her leg and apparently it was when they remove the cast for the 2nd time that they noticed this area underneath on her foot that led to what was thought to be initially just a 1st and 2nd toe amputation but led to a transmetatarsal amputation. That was on 03/11/2020. 04/21/2020 upon evaluation today patient actually appears to be doing quite well in regard to her foot ulcer all things considered. This is still significant wound there is some tendon exposed which is becoming necrotic and will need to be removed but other than this overall I feel like she is actually doing quite well. I do believe that wound VAC will benefit her with that being said we do not have the approval for that she received a box from [facility] but unfortunately is out of network therefore began to get it through  a different company. 05/05/2020 on evaluation today patient actually appears to be doing well with regard to her wound. Fortunately there is no signs of active infection at this time which is great news. She does have some slough buildup also she has got the wound VAC started which is excellent  she seems to be doing well with this. 05/19/2020 on evaluation today patient's wound actually appears to be doing quite well. The wound VAC is doing an excellent job for her and overall I feel like she is making great progress. There is no sign of active infection at this time which is also great news. The only issue we have is apparently some complication with home health. They are saying that the night may be able to continue to come out of it was somewhat strange as to the reasons why. Nonetheless it appears the patient is in the midst of recertification. The wound VAC is helping and she is getting excellent benefit from the use of it therefore I do not think will have any trouble getting this reapproved. 06/02/2020 upon evaluation today patient appears to be doing fairly well in regard to her foot ulcer. The wound VAC still continues to do well for her in my opinion she has better granulation than even last time I saw her in general I feel like she is headed in the correct direction. There is no sign of active infection at this time which is great news. 12/2; this is a patient with type 2 diabetes who is undergoing a right transmetatarsal amputation wound on the plantar foot which we have been treating with a wound VAC. She had been making nice progress. She has home health changing the VAC. The patient lives alone she does not really have a good sense of the wound or anything around this. Apparently home health noted increasing drainage redness and swelling proximal to the wound on the plantar foot. Apparently they tried to get antibiotics through primary care, Dr. Nona Dell office [presumably he did the amputation] but nobody would order these. Jeri Cos started antibiotics on Monday. There was not a slot for her to be seen yesterday. There is some report of the ill alarm going off on her back she took the [facility] off this morning. She has not been systemically unwell. The patient also has  significant PAD status post revascularization Objective Constitutional Sitting or standing Blood Pressure is within target range for patient.. Pulse regular and within target range for patient.Marland Kitchen Respirations regular, non-labored and within target range.Marland Kitchen Appears in no distress. Vitals Time Taken: 8:32 AM, Height: 68 in, Weight: 167 lbs, BMI: 25.4, Temperature: 98.4 F, Pulse: 93 bpm, Respiratory Rate: 18 breaths/min, Blood Pressure: 134/87 mmHg. General Notes: Wound exam; the wound itself does not look too bad healthy granulation. I carefully used a Q-tip to see if there was any probing depth I could not determine any. There was no purulence. The real concerning area is proximal from the wound there is erythema and some swelling and even through her neuropathy some tenderness. Integumentary (Hair, Skin) Wound #2 status is Open. Original cause of wound was Surgical Injury. The wound is located on the Right Amputation Site - Transmetatarsal. The wound measures 3.7cm length x 4.5cm width x 0.2cm depth; 13.077cm^2 area and 2.615cm^3 volume. There is Fat Layer (Subcutaneous Tissue) exposed. There is no tunneling or undermining noted. There is a medium amount of serosanguineous drainage noted. The wound margin is flat and intact. There is medium (34-66%) red, pink granulation  within the wound bed. There is a medium (34-66%) amount of necrotic tissue within the wound bed including Adherent Slough. Assessment Active Problems ICD-10 Type 2 diabetes mellitus with foot ulcer Disruption of external operation (surgical) wound, not elsewhere classified, initial encounter Non-pressure chronic ulcer of other part of right foot with fat layer exposed Non-pressure chronic ulcer of other part of right foot with necrosis of muscle Other specified peripheral vascular diseases Cellulitis of left lower limb Plan Follow-up Appointments: Return Appointment in 1 week. - with Margarita Grizzle, [place] on Wednesday. Bathing/ Shower/  Hygiene: May shower with protection but do not get wound dressing(s) wet. Negative Presssure Wound Therapy: Other: - hold vac for now. Will evaluate next week. Off-Loading: Removable cast walker boot to: - right foot with walking. Other: - minimal weight bearing to right foot. Additional Orders / Instructions: Other: - Patient to pick up additional three days of antibiotics from Pharmacy. ***If this worsens increasing redness, more pain, fever, chills patient to go to the emergency department.*** Home Health: New wound care orders this week; continue Home Health for wound care. May utilize formulary equivalent dressing for wound treatment orders unless otherwise specified. [facility] [place] home health to change dressing three times a week. Home Health nurse to call wound center Friday after dressing change to update wound center with patient wound status. [phone] Dressing changes to be completed by [place] on Monday / Wednesday / Friday except when patient has scheduled visit at [facility]. Laboratory ordered were: Anerobic culture - culture right transmet foot wound. The following medication(s) was prescribed: doxycycline monohydrate oral 100 mg capsule 1 capsule oral bid for a further 3 days (continuing rx) for celllulitis starting 06/21/2020 WOUND #2: - Amputation Site - Transmetatarsal Wound Laterality: Right Cleanser: Normal Saline ([place]) 3 x Per Week/15 Days Discharge Instructions: Cleanse the wound with Normal Saline prior to applying a clean dressing using gauze sponges, not tissue or cotton balls. Prim Dressing: KerraCel Ag Gelling Fiber Dressing, 4x5 in (silver alginate) (Home Health) 3 x Per Week/15 Days ary Discharge Instructions: Apply silver alginate to wound bed as instructed Secondary Dressing: ABD Pad, 5x9 ([place]) 3 x Per Week/15 Days Discharge Instructions: Apply over primary dressing as directed. Secured With: Elastic Bandage 4 inch (ACE bandage)  (Home Health) 3 x Per Week/15 Days Discharge Instructions: Secure with ACE bandage as directed. Secured With: [employer], 4.5x3.1 (in/yd) (Home Health) 3 x Per Week/15 Days Discharge Instructions: Secure with Kerlix as directed. 1. I have not seen this patient's wound before but there is erythema and swelling underneath this with some tenderness even to her neuropathy. I suspect this is indeed an infection. It is possible to have drainage sitting under the VAC seal that could cause something that looks like this although I do not have this history exactly. 2. The patient is not systemically unwell. At this point I cannot tell whether the doxycycline has been effective or not. In my opinion there is nothing here that is worth culturing there is no draining or probing areas and she is on antibiotics already which could affect the culture. I therefore elected just to extend the doxycycline 3. I have carefully explained to the patient and her friend who is present and would be glad to talk to home health that what I am looking for is contraction of the area of swelling and redness. I have marked this with a marker the border of redness with the surrounding normal skin as a guide  4. We will have to see how this responds. She is already vigorously offloading this. I put the wound VAC on hold for now we will use silver alginate on the wound and follow the swelling and redness on antibiotics which I have extended ADDENDUM ooOur discharge nurse was able to milk some purulence out of the more proximal part of the wound by palpating the swollen area. This confirms the infection I have a culture result. I have extended the doxycycline. I do not think this changes the treatment plan exactly at this point. Electronic Signature(s) Signed: 06/17/2020 12:52:04 PM By: Linton Ham MD Signed: 06/17/2020 5:54:37 PM By: Deon Pilling Entered By: Deon Pilling on 06/17/2020  09:39:30 -------------------------------------------------------------------------------- SuperBill Details Patient Name: Date of Service: [name] 06/17/2020 Medical Record Number: [id number] Patient Account Number: [id number] Date of Birth/Sex: Treating RN: 08-21-1931 (86 y.o. [name], [name] Primary Care Provider: Marton Redwood Other Clinician: Referring Provider: Treating Provider/Extender: Estelle Grumbles in Treatment: 9 Diagnosis Coding ICD-10 Codes Code Description [phone] Type 2 diabetes mellitus with foot ulcer T81.31XA Disruption of external operation (surgical) wound, not elsewhere classified, initial encounter L97.512 Non-pressure chronic ulcer of other part of right foot with fat layer exposed L97.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 I73.89 Other specified peripheral vascular diseases L03.116 Cellulitis of left lower limb Facility Procedures CPT4 Code: 21224825 Description: 99214 - WOUND CARE VISIT-LEV 4 EST PT Modifier: Quantity: 1 Physician Procedures : CPT4 Code Description Modifier [id number] - WC PHYS LEVEL 4 - EST PT ICD-10 Diagnosis Description L97.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 L03.116 Cellulitis of left lower limb Quantity: 1 Electronic Signature(s) Signed: 06/17/2020 12:52:04 PM By: Linton Ham MD Signed: 06/17/2020 5:54:37 PM By: Deon Pilling Entered By: Deon Pilling on 06/17/2020 12:15:39

## 2020-06-17 NOTE — Progress Notes (Addendum)
Jeanette, Matthews ([id number]) Visit Report for 06/17/2020 Arrival Information Details Patient Name: Date of Service: [name], [name] 06/17/2020 8:15 A M Medical Record Number: [id number] Patient Account Number: [id number] Date of Birth/Sex: Treating RN: [birth year] (86 y.o. [name] Primary Care Etrulia Zarr: Marton Redwood Other Clinician: Referring Trevino Wyatt: Treating Brianah Hopson/Extender: Estelle Grumbles in Treatment: 9 Visit Information History Since Last Visit All ordered tests and consults were completed: No Patient Arrived: [name] Added or deleted any medications: No Arrival Time: 08:32 Any new allergies or adverse reactions: No Accompanied By: friend Had a fall or experienced change in No Transfer Assistance: None activities of daily living that may affect Patient Identification Verified: Yes risk of falls: Secondary Verification Process Completed: Yes Signs or symptoms of abuse/neglect since last visito No Patient Requires Transmission-Based Precautions: No Hospitalized since last visit: No Patient Has Alerts: Yes Implantable device outside of the clinic excluding No Patient Alerts: Patient on Blood Thinner cellular tissue based products placed in the center since last visit: Has Dressing in Place as Prescribed: Yes Pain Present Now: No Electronic Signature(s) Signed: 06/17/2020 4:55:20 PM By: Carlene Coria RN Entered By: Carlene Coria on 06/17/2020 08:32:56 -------------------------------------------------------------------------------- Clinic Level of Care Assessment Details Patient Name: Date of Service: Jeanette, Matthews 06/17/2020 8:15 A M Medical Record Number: [id number] Patient Account Number: [id number] Date of Birth/Sex: Treating RN: 1932-03-16 (86 y.o. [name], Meta.Reding Primary Care Nhyla Nappi: Marton Redwood Other Clinician: Referring Mylasia Vorhees: Treating Zakyia Gagan/Extender: Estelle Grumbles in Treatment: 9 Clinic Level of Care  Assessment Items TOOL 4 Quantity Score X- 1 0 Use when only an EandM is performed on FOLLOW-UP visit ASSESSMENTS - Nursing Assessment / Reassessment X- 1 10 Reassessment of Co-morbidities (includes updates in patient status) X- 1 5 Reassessment of Adherence to Treatment Plan ASSESSMENTS - Wound and Skin A ssessment / Reassessment []  - 0 Simple Wound Assessment / Reassessment - one wound X- 1 5 Complex Wound Assessment / Reassessment - multiple wounds X- 1 10 Dermatologic / Skin Assessment (not related to wound area) ASSESSMENTS - Focused Assessment X- 1 5 Circumferential Edema Measurements - multi extremities X- 1 10 Nutritional Assessment / Counseling / Intervention []  - 0 Lower Extremity Assessment (monofilament, tuning fork, pulses) []  - 0 Peripheral Arterial Disease Assessment (using hand held doppler) ASSESSMENTS - Ostomy and/or Continence Assessment and Care []  - 0 Incontinence Assessment and Management []  - 0 Ostomy Care Assessment and Management (repouching, etc.) PROCESS - Coordination of Care []  - 0 Simple Patient / Family Education for ongoing care X- 1 20 Complex (extensive) Patient / Family Education for ongoing care X- 1 10 Staff obtains Programmer, systems, Records, T Results / Process Orders est X- 1 10 Staff telephones HHA, Nursing Homes / Clarify orders / etc []  - 0 Routine Transfer to another Facility (non-emergent condition) []  - 0 Routine Hospital Admission (non-emergent condition) []  - 0 New Admissions / Biomedical engineer / Ordering NPWT Apligraf, etc. , []  - 0 Emergency Hospital Admission (emergent condition) []  - 0 Simple Discharge Coordination X- 1 15 Complex (extensive) Discharge Coordination PROCESS - Special Needs []  - 0 Pediatric / Minor Patient Management []  - 0 Isolation Patient Management []  - 0 Hearing / Language / Visual special needs []  - 0 Assessment of Community assistance (transportation, D/C planning, etc.) []  -  0 Additional assistance / Altered mentation []  - 0 Support Surface(s) Assessment (bed, cushion, seat, etc.) INTERVENTIONS - Wound Cleansing / Measurement []  - 0 Simple Wound Cleansing - one  wound X- 1 5 Complex Wound Cleansing - multiple wounds X- 1 5 Wound Imaging (photographs - any number of wounds) []  - 0 Wound Tracing (instead of photographs) []  - 0 Simple Wound Measurement - one wound X- 1 5 Complex Wound Measurement - multiple wounds INTERVENTIONS - Wound Dressings []  - 0 Small Wound Dressing one or multiple wounds X- 1 15 Medium Wound Dressing one or multiple wounds []  - 0 Large Wound Dressing one or multiple wounds []  - 0 Application of Medications - topical []  - 0 Application of Medications - injection INTERVENTIONS - Miscellaneous []  - 0 External ear exam []  - 0 Specimen Collection (cultures, biopsies, blood, body fluids, etc.) X- 1 5 Specimen(s) / Culture(s) sent or taken to Lab for analysis []  - 0 Patient Transfer (multiple staff / Civil Service fast streamer / Similar devices) []  - 0 Simple Staple / Suture removal (25 or less) []  - 0 Complex Staple / Suture removal (26 or more) []  - 0 Hypo / Hyperglycemic Management (close monitor of Blood Glucose) []  - 0 Ankle / Brachial Index (ABI) - do not check if billed separately X- 1 5 Vital Signs Has the patient been seen at the hospital within the last three years: Yes Total Score: 140 Level Of Care: New/Established - Level 4 Electronic Signature(s) Signed: 06/17/2020 5:54:37 PM By: Deon Pilling Entered By: Deon Pilling on 06/17/2020 12:15:23 -------------------------------------------------------------------------------- Encounter Discharge Information Details Patient Name: Date of Service: [name] 06/17/2020 8:15 A M Medical Record Number: [id number] Patient Account Number: [id number] Date of Birth/Sex: Treating RN: [birth year] (86 y.o. [name] Primary Care Colburn Asper: Marton Redwood Other  Clinician: Referring Rafeef Lau: Treating Jamiel Goncalves/Extender: Estelle Grumbles in Treatment: 9 Encounter Discharge Information Items Discharge Condition: Stable Ambulatory Status: Walker Discharge Destination: Home Transportation: Private Auto Accompanied By: friend Schedule Follow-up Appointment: Yes Clinical Summary of Care: Patient Declined Electronic Signature(s) Signed: 06/17/2020 5:52:01 PM By: Baruch Gouty RN, BSN Entered By: Baruch Gouty on 06/17/2020 09:37:15 -------------------------------------------------------------------------------- Lower Extremity Assessment Details Patient Name: Date of Service: [name] 06/17/2020 8:15 A M Medical Record Number: [id number] Patient Account Number: [id number] Date of Birth/Sex: Treating RN: 1931/12/03 (86 y.o. [name] Primary Care Tabbetha Kutscher: Marton Redwood Other Clinician: Referring Chrishon Martino: Treating Kaenan Jake/Extender: Estelle Grumbles in Treatment: 9 Edema Assessment Assessed: Shirlyn Goltz: No] Patrice Paradise: No] Edema: [Left: Ye] [Right: s] Calf Left: Right: Point of Measurement: 32 cm From Medial Instep 31.5 cm Ankle Left: Right: Point of Measurement: 11 cm From Medial Instep 20.5 cm Electronic Signature(s) Signed: 06/17/2020 4:55:20 PM By: Carlene Coria RN Entered By: Carlene Coria on 06/17/2020 08:33:32 -------------------------------------------------------------------------------- Multi Wound Chart Details Patient Name: Date of Service: [name] 06/17/2020 8:15 A M Medical Record Number: [id number] Patient Account Number: [id number] Date of Birth/Sex: Treating RN: Nov 25, 1931 (86 y.o. 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] [N/A:N/A] Secondary Etiology: [2:Congestive Heart Failure, Deep Vein N/A] Comorbid History: [2:Thrombosis, Hypertension, Peripheral Arterial Disease, Peripheral Venous Disease, Type II Diabetes, Neuropathy 03/11/2020] [N/A:N/A] Date Acquired: [2:9] [N/A:N/A] Weeks of Treatment: [2:Open] [N/A:N/A] Wound Status: [2:3.7x4.5x0.2] [N/A:N/A] Measurements L x W x D (cm) [2:13.077] [N/A:N/A] A (  cm) : rea [2:2.615] [N/A:N/A] Volume (cm) : [2:58.90%] [N/A:N/A] % Reduction in Area: [2:93.20%] [N/A:N/A] % Reduction in Volume: [2:Full Thickness Without Exposed] [N/A:N/A] Classification: [2:Support Structures Medium] [N/A:N/A] Exudate Amount: [2:Serosanguineous] [N/A:N/A] Exudate Type: [2:red, brown] [N/A:N/A] Exudate Color: [2:Flat and Intact] [N/A:N/A] Wound Margin: [2:Medium (34-66%)] [N/A:N/A] Granulation Amount: [2:Red, Pink] [N/A:N/A] Granulation Quality: [2:Medium (34-66%)] [N/A:N/A] Necrotic Amount: [2:Fat Layer (Subcutaneous Tissue): Yes N/A] Exposed Structures: [2:Fascia: No Tendon: No Muscle: No Joint: No Bone: No Medium (34-66%)] [N/A:N/A] Treatment Notes Electronic Signature(s) Signed: 06/17/2020 12:52:04 PM By: Linton Ham MD Signed: 06/17/2020 5:54:37 PM By: Deon Pilling Entered By: Linton Ham on 06/17/2020 09:04:36 -------------------------------------------------------------------------------- Multi-Disciplinary Care Plan Details Patient Name: Date of Service: [name] 06/17/2020 8:15 A M Medical Record Number: [id number] Patient Account Number: [id number] Date of Birth/Sex: Treating RN: [birth year] (86 y.o. [name], [name] Primary Care Jaiyon Wander: Marton Redwood Other  Clinician: Referring Jilliam Bellmore: Treating Sadonna Kotara/Extender: Estelle Grumbles in Treatment: 9 Active Inactive Electronic Signature(s) Signed: 06/22/2020 4:58:51 PM By: Deon Pilling Signed: 06/22/2020 5:33:51 PM By: Baruch Gouty RN, BSN Previous Signature: 06/17/2020 5:54:37 PM Version By: Deon Pilling Entered By: Baruch Gouty on 06/22/2020 16:44:19 -------------------------------------------------------------------------------- Pain Assessment Details Patient Name: Date of Service: [name] 06/17/2020 8:15 A M Medical Record Number: [id number] Patient Account Number: [id number] Date of Birth/Sex: Treating RN: 1932/05/31 (86 y.o. [name] Primary Care Ezequias Lard: Marton Redwood Other Clinician: Referring Rafik Koppel: Treating Melik Blancett/Extender: Estelle Grumbles in Treatment: 9 Active Problems Location of Pain Severity and Description of Pain Patient Has Paino No Site Locations Pain Management and Medication Current Pain Management: Electronic Signature(s) Signed: 06/17/2020 4:55:20 PM By: Carlene Coria RN Entered By: Carlene Coria on 06/17/2020 08:33:26 -------------------------------------------------------------------------------- Patient/Caregiver Education Details Patient Name: Date of Service: Janell Quiet 12/2/2021andnbsp8:15 A M Medical Record Number: [id number] Patient Account Number: [id number] Date of Birth/Gender: Treating RN: February 20, 1932 (86 y.o. Debby Bud Primary Care Physician: Marton Redwood Other Clinician: Referring Physician: Treating Physician/Extender: Estelle Grumbles in Treatment: 9 Education Assessment Education Provided To: Patient Education Topics Provided Infection: Handouts: Infection Prevention and Management Methods: Explain/Verbal Responses: Reinforcements needed Electronic Signature(s) Signed: 06/17/2020 5:54:37 PM By: Deon Pilling Entered By: Deon Pilling on  06/17/2020 08:30:06 -------------------------------------------------------------------------------- Wound Assessment Details Patient Name: Date of Service: CRESCENT, GOTHAM 06/17/2020 8:15 A M Medical Record Number: [id number] Patient Account Number: [id number] Date of Birth/Sex: Treating RN: 04-19-1932 (86 y.o. [name] Primary Care Behr Cislo: Marton Redwood Other Clinician: Referring Lameka Disla: Treating Marvion Bastidas/Extender: Estelle Grumbles in Treatment: 9 Wound Status Wound Number: 2 Primary Open Surgical Wound Etiology: Wound Location: Right Amputation Site - Transmetatarsal Secondary Arterial Insufficiency Ulcer Wounding Event: Surgical Injury Etiology: Date Acquired: 03/11/2020 Wound Open Weeks Of Treatment: 9 Status: Clustered Wound: No Comorbid Congestive Heart Failure, Deep Vein Thrombosis, Hypertension, History: Peripheral Arterial Disease, Peripheral Venous Disease, Type II Diabetes, Neuropathy Wound Measurements Length: (cm) 3.7 Width: (cm) 4.5 Depth: (cm) 0.2 Area: (cm) 13.077 Volume: (cm) 2.615 % Reduction in Area: 58.9% % Reduction in Volume: 93.2% Epithelialization: Medium (34-66%) Tunneling: No Undermining: No Wound Description Classification: Full Thickness Without Exposed Support Structures Wound Margin: Flat and Intact Exudate Amount: Medium Exudate Type: Serosanguineous Exudate Color: red, brown Wound Bed Granulation Amount: Medium (34-66%) Granulation Quality: Red, Pink Necrotic Amount: Medium (34-66%) Necrotic Quality: Adherent Slough Foul Odor After Cleansing: No Slough/Fibrino Yes Exposed Structure Fascia Exposed: No Fat Layer (Subcutaneous Tissue) Exposed: Yes Tendon Exposed: No Muscle Exposed: No Joint Exposed: No  Bone Exposed: No Electronic Signature(s) Signed: 06/17/2020 4:55:20 PM By: Carlene Coria RN Entered By: Carlene Coria on 06/17/2020  08:34:29 -------------------------------------------------------------------------------- [place] Details Patient Name: Date of Service: [name] 06/17/2020 8:15 A M Medical Record Number: [id number] Patient Account Number: [id number] Date of Birth/Sex: Treating RN: [birth year] (86 y.o. [name] Primary Care Taiki Buckwalter: Marton Redwood Other Clinician: Referring Shonita Rinck: Treating Sanjit Mcmichael/Extender: Estelle Grumbles in Treatment: 9 Vital Signs Time Taken: 08:32 Temperature (F): 98.4 Height (in): 68 Pulse (bpm): 93 Weight (lbs): 167 Respiratory Rate (breaths/min): 18 Body Mass Index (BMI): 25.4 Blood Pressure (mmHg): 134/87 Reference Range: 80 - 120 mg / dl Electronic Signature(s) Signed: 06/17/2020 4:55:20 PM By: Carlene Coria RN Entered By: Carlene Coria on 06/17/2020 08:33:20

## 2020-06-18 ENCOUNTER — Inpatient Hospital Stay (HOSPITAL_COMMUNITY)
Admission: EM | Admit: 2020-06-18 | Discharge: 2020-06-28 | DRG: 617 | Disposition: A | Discharge status: Skilled Nursing Facility | Payer: HMO | Attending: Internal Medicine | Admitting: Internal Medicine

## 2020-06-18 ENCOUNTER — Emergency Department (HOSPITAL_COMMUNITY): Payer: HMO

## 2020-06-18 ENCOUNTER — Other Ambulatory Visit: Payer: Self-pay

## 2020-06-18 DIAGNOSIS — I5032 Chronic diastolic (congestive) heart failure: Secondary | ICD-10-CM | POA: Diagnosis present

## 2020-06-18 DIAGNOSIS — Z66 Do not resuscitate: Secondary | ICD-10-CM | POA: Diagnosis present

## 2020-06-18 DIAGNOSIS — E11628 Type 2 diabetes mellitus with other skin complications: Secondary | ICD-10-CM | POA: Diagnosis present

## 2020-06-18 DIAGNOSIS — M86672 Other chronic osteomyelitis, left ankle and foot: Secondary | ICD-10-CM

## 2020-06-18 DIAGNOSIS — I2510 Atherosclerotic heart disease of native coronary artery without angina pectoris: Secondary | ICD-10-CM | POA: Diagnosis present

## 2020-06-18 DIAGNOSIS — Z7982 Long term (current) use of aspirin: Secondary | ICD-10-CM

## 2020-06-18 DIAGNOSIS — E1169 Type 2 diabetes mellitus with other specified complication: Principal | ICD-10-CM | POA: Diagnosis present

## 2020-06-18 DIAGNOSIS — L97516 Non-pressure chronic ulcer of other part of right foot with bone involvement without evidence of necrosis: Secondary | ICD-10-CM | POA: Diagnosis present

## 2020-06-18 DIAGNOSIS — L089 Local infection of the skin and subcutaneous tissue, unspecified: Secondary | ICD-10-CM

## 2020-06-18 DIAGNOSIS — Z79899 Other long term (current) drug therapy: Secondary | ICD-10-CM

## 2020-06-18 DIAGNOSIS — I110 Hypertensive heart disease with heart failure: Secondary | ICD-10-CM | POA: Diagnosis present

## 2020-06-18 DIAGNOSIS — I10 Essential (primary) hypertension: Secondary | ICD-10-CM | POA: Diagnosis present

## 2020-06-18 DIAGNOSIS — F32A Depression, unspecified: Secondary | ICD-10-CM | POA: Diagnosis present

## 2020-06-18 DIAGNOSIS — Z8249 Family history of ischemic heart disease and other diseases of the circulatory system: Secondary | ICD-10-CM

## 2020-06-18 DIAGNOSIS — K219 Gastro-esophageal reflux disease without esophagitis: Secondary | ICD-10-CM | POA: Diagnosis present

## 2020-06-18 DIAGNOSIS — E782 Mixed hyperlipidemia: Secondary | ICD-10-CM | POA: Diagnosis present

## 2020-06-18 DIAGNOSIS — E039 Hypothyroidism, unspecified: Secondary | ICD-10-CM | POA: Diagnosis present

## 2020-06-18 DIAGNOSIS — L03116 Cellulitis of left lower limb: Secondary | ICD-10-CM | POA: Diagnosis present

## 2020-06-18 DIAGNOSIS — Z7902 Long term (current) use of antithrombotics/antiplatelets: Secondary | ICD-10-CM

## 2020-06-18 DIAGNOSIS — Z87891 Personal history of nicotine dependence: Secondary | ICD-10-CM

## 2020-06-18 DIAGNOSIS — Z955 Presence of coronary angioplasty implant and graft: Secondary | ICD-10-CM

## 2020-06-18 DIAGNOSIS — Z833 Family history of diabetes mellitus: Secondary | ICD-10-CM

## 2020-06-18 DIAGNOSIS — Z20822 Contact with and (suspected) exposure to covid-19: Secondary | ICD-10-CM | POA: Diagnosis present

## 2020-06-18 DIAGNOSIS — Z794 Long term (current) use of insulin: Secondary | ICD-10-CM

## 2020-06-18 DIAGNOSIS — E11621 Type 2 diabetes mellitus with foot ulcer: Secondary | ICD-10-CM | POA: Diagnosis present

## 2020-06-18 DIAGNOSIS — E1151 Type 2 diabetes mellitus with diabetic peripheral angiopathy without gangrene: Secondary | ICD-10-CM | POA: Diagnosis present

## 2020-06-18 DIAGNOSIS — M869 Osteomyelitis, unspecified: Secondary | ICD-10-CM | POA: Diagnosis present

## 2020-06-18 LAB — CBC WITH DIFFERENTIAL/PLATELET
Abs Immature Granulocytes: 0.07 10*3/uL (ref 0.00–0.07)
Basophils Absolute: 0.1 10*3/uL (ref 0.0–0.1)
Basophils Relative: 0 %
Eosinophils Absolute: 0.5 10*3/uL (ref 0.0–0.5)
Eosinophils Relative: 4 %
HCT: 31.3 % — ABNORMAL LOW (ref 36.0–46.0)
Hemoglobin: 9.8 g/dL — ABNORMAL LOW (ref 12.0–15.0)
Immature Granulocytes: 1 %
Lymphocytes Relative: 15 %
Lymphs Abs: 2.2 10*3/uL (ref 0.7–4.0)
MCH: 26.8 pg (ref 26.0–34.0)
MCHC: 31.3 g/dL (ref 30.0–36.0)
MCV: 85.5 fL (ref 80.0–100.0)
Monocytes Absolute: 1.3 10*3/uL — ABNORMAL HIGH (ref 0.1–1.0)
Monocytes Relative: 9 %
Neutro Abs: 10.2 10*3/uL — ABNORMAL HIGH (ref 1.7–7.7)
Neutrophils Relative %: 71 %
Platelets: 443 10*3/uL — ABNORMAL HIGH (ref 150–400)
RBC: 3.66 MIL/uL — ABNORMAL LOW (ref 3.87–5.11)
RDW: 15.3 % (ref 11.5–15.5)
WBC: 14.3 10*3/uL — ABNORMAL HIGH (ref 4.0–10.5)
nRBC: 0 % (ref 0.0–0.2)

## 2020-06-18 LAB — COMPREHENSIVE METABOLIC PANEL
ALT: 15 U/L (ref 0–44)
AST: 19 U/L (ref 15–41)
Albumin: 3.1 g/dL — ABNORMAL LOW (ref 3.5–5.0)
Alkaline Phosphatase: 66 U/L (ref 38–126)
Anion gap: 12 (ref 5–15)
BUN: 15 mg/dL (ref 8–23)
CO2: 25 mmol/L (ref 22–32)
Calcium: 9.3 mg/dL (ref 8.9–10.3)
Chloride: 100 mmol/L (ref 98–111)
Creatinine, Ser: 0.68 mg/dL (ref 0.44–1.00)
GFR, Estimated: >60 mL/min (ref >60)
Glucose, Bld: 76 mg/dL (ref 70–99)
Potassium: 4.2 mmol/L (ref 3.5–5.1)
Sodium: 137 mmol/L (ref 135–145)
Total Bilirubin: 0.4 mg/dL (ref 0.3–1.2)
Total Protein: 6.8 g/dL (ref 6.5–8.1)

## 2020-06-18 LAB — URINALYSIS, ROUTINE W REFLEX MICROSCOPIC
Bilirubin Urine: NEGATIVE
Glucose, UA: NEGATIVE mg/dL
Hgb urine dipstick: NEGATIVE
Ketones, ur: NEGATIVE mg/dL
Leukocytes,Ua: NEGATIVE
Nitrite: NEGATIVE
Protein, ur: NEGATIVE mg/dL
Specific Gravity, Urine: 1.015 (ref 1.005–1.030)
pH: 5 (ref 5.0–8.0)

## 2020-06-18 LAB — LACTIC ACID, PLASMA: Lactic Acid, Venous: 1.2 mmol/L (ref 0.5–1.9)

## 2020-06-18 NOTE — ED Triage Notes (Signed)
Pt here for eval of draining wound secondary to toe amputation on 8/25 by Dr. Doran Durand. Saw MD at the wound clinic yesterday who marked the area with a green marker, and when her home health nurse came to change the dressing today, the area had progressed past the marker. Has completed course of doxycycline within the last week.

## 2020-06-19 ENCOUNTER — Encounter (HOSPITAL_COMMUNITY): Payer: Self-pay | Admitting: Internal Medicine

## 2020-06-19 ENCOUNTER — Inpatient Hospital Stay (HOSPITAL_COMMUNITY): Payer: HMO

## 2020-06-19 DIAGNOSIS — Z87891 Personal history of nicotine dependence: Secondary | ICD-10-CM | POA: Diagnosis not present

## 2020-06-19 DIAGNOSIS — M869 Osteomyelitis, unspecified: Secondary | ICD-10-CM | POA: Diagnosis present

## 2020-06-19 DIAGNOSIS — K219 Gastro-esophageal reflux disease without esophagitis: Secondary | ICD-10-CM | POA: Diagnosis present

## 2020-06-19 DIAGNOSIS — Z7902 Long term (current) use of antithrombotics/antiplatelets: Secondary | ICD-10-CM | POA: Diagnosis not present

## 2020-06-19 DIAGNOSIS — I110 Hypertensive heart disease with heart failure: Secondary | ICD-10-CM | POA: Diagnosis present

## 2020-06-19 DIAGNOSIS — Z8249 Family history of ischemic heart disease and other diseases of the circulatory system: Secondary | ICD-10-CM | POA: Diagnosis not present

## 2020-06-19 DIAGNOSIS — M86672 Other chronic osteomyelitis, left ankle and foot: Secondary | ICD-10-CM | POA: Insufficient documentation

## 2020-06-19 DIAGNOSIS — T148XXA Other injury of unspecified body region, initial encounter: Secondary | ICD-10-CM | POA: Diagnosis present

## 2020-06-19 DIAGNOSIS — L089 Local infection of the skin and subcutaneous tissue, unspecified: Secondary | ICD-10-CM | POA: Diagnosis present

## 2020-06-19 DIAGNOSIS — L03116 Cellulitis of left lower limb: Secondary | ICD-10-CM | POA: Diagnosis present

## 2020-06-19 DIAGNOSIS — I2510 Atherosclerotic heart disease of native coronary artery without angina pectoris: Secondary | ICD-10-CM | POA: Diagnosis present

## 2020-06-19 DIAGNOSIS — Z79899 Other long term (current) drug therapy: Secondary | ICD-10-CM | POA: Diagnosis not present

## 2020-06-19 DIAGNOSIS — Z955 Presence of coronary angioplasty implant and graft: Secondary | ICD-10-CM | POA: Diagnosis not present

## 2020-06-19 DIAGNOSIS — Z20822 Contact with and (suspected) exposure to covid-19: Secondary | ICD-10-CM | POA: Diagnosis present

## 2020-06-19 DIAGNOSIS — F32A Depression, unspecified: Secondary | ICD-10-CM | POA: Diagnosis present

## 2020-06-19 DIAGNOSIS — E11628 Type 2 diabetes mellitus with other skin complications: Secondary | ICD-10-CM | POA: Diagnosis present

## 2020-06-19 DIAGNOSIS — E11621 Type 2 diabetes mellitus with foot ulcer: Secondary | ICD-10-CM | POA: Diagnosis present

## 2020-06-19 DIAGNOSIS — E1151 Type 2 diabetes mellitus with diabetic peripheral angiopathy without gangrene: Secondary | ICD-10-CM | POA: Diagnosis present

## 2020-06-19 DIAGNOSIS — E039 Hypothyroidism, unspecified: Secondary | ICD-10-CM | POA: Diagnosis present

## 2020-06-19 DIAGNOSIS — Z833 Family history of diabetes mellitus: Secondary | ICD-10-CM | POA: Diagnosis not present

## 2020-06-19 DIAGNOSIS — Z794 Long term (current) use of insulin: Secondary | ICD-10-CM | POA: Diagnosis not present

## 2020-06-19 DIAGNOSIS — E782 Mixed hyperlipidemia: Secondary | ICD-10-CM | POA: Diagnosis present

## 2020-06-19 DIAGNOSIS — I5032 Chronic diastolic (congestive) heart failure: Secondary | ICD-10-CM | POA: Diagnosis present

## 2020-06-19 DIAGNOSIS — L97516 Non-pressure chronic ulcer of other part of right foot with bone involvement without evidence of necrosis: Secondary | ICD-10-CM | POA: Diagnosis present

## 2020-06-19 DIAGNOSIS — I10 Essential (primary) hypertension: Secondary | ICD-10-CM | POA: Diagnosis not present

## 2020-06-19 DIAGNOSIS — Z7982 Long term (current) use of aspirin: Secondary | ICD-10-CM | POA: Diagnosis not present

## 2020-06-19 DIAGNOSIS — E1169 Type 2 diabetes mellitus with other specified complication: Secondary | ICD-10-CM | POA: Diagnosis present

## 2020-06-19 DIAGNOSIS — Z66 Do not resuscitate: Secondary | ICD-10-CM | POA: Diagnosis present

## 2020-06-19 DIAGNOSIS — I25119 Atherosclerotic heart disease of native coronary artery with unspecified angina pectoris: Secondary | ICD-10-CM | POA: Diagnosis not present

## 2020-06-19 LAB — RESP PANEL BY RT-PCR (FLU A&B, COVID) ARPGX2
Influenza A by PCR: NEGATIVE
Influenza B by PCR: NEGATIVE
SARS Coronavirus 2 by RT PCR: NEGATIVE

## 2020-06-19 LAB — C-REACTIVE PROTEIN: CRP: 8.3 mg/dL — ABNORMAL HIGH (ref <1.0)

## 2020-06-19 LAB — GLUCOSE, CAPILLARY
Glucose-Capillary: 198 mg/dL — ABNORMAL HIGH (ref 70–99)
Glucose-Capillary: 170 mg/dL — ABNORMAL HIGH (ref 70–99)
Glucose-Capillary: 176 mg/dL — ABNORMAL HIGH (ref 70–99)

## 2020-06-19 LAB — PREALBUMIN: Prealbumin: 10.8 mg/dL — ABNORMAL LOW (ref 18–38)

## 2020-06-19 LAB — SEDIMENTATION RATE: Sed Rate: 96 mm/hr — ABNORMAL HIGH (ref 0–22)

## 2020-06-19 MED ORDER — ACETAMINOPHEN 325 MG PO TABS: 650.0000 mg | ORAL_TABLET | Freq: Four times a day (QID) | ORAL | Status: DC | PRN

## 2020-06-19 MED ORDER — ONDANSETRON HCL 4 MG PO TABS: 4.0000 mg | ORAL_TABLET | Freq: Four times a day (QID) | ORAL | Status: DC | PRN

## 2020-06-19 MED ORDER — PIPERACILLIN-TAZOBACTAM 3.375 G IVPB 30 MIN
3.3750 g | Freq: Once | INTRAVENOUS | Status: AC
  Administered 2020-06-19: 3.375 g via INTRAVENOUS
  Filled 2020-06-19: qty 50

## 2020-06-19 MED ORDER — INSULIN ASPART 100 UNIT/ML ~~LOC~~ SOLN
0.0000 [IU] | Freq: Three times a day (TID) | SUBCUTANEOUS | Status: DC
  Administered 2020-06-19 – 2020-06-20 (×3): 3 [IU] via SUBCUTANEOUS
  Administered 2020-06-20: 2 [IU] via SUBCUTANEOUS
  Administered 2020-06-20: 3 [IU] via SUBCUTANEOUS
  Administered 2020-06-21: 2 [IU] via SUBCUTANEOUS
  Administered 2020-06-21 – 2020-06-23 (×3): 3 [IU] via SUBCUTANEOUS
  Administered 2020-06-23: 2 [IU] via SUBCUTANEOUS
  Administered 2020-06-24: 3 [IU] via SUBCUTANEOUS
  Administered 2020-06-24 – 2020-06-25 (×3): 2 [IU] via SUBCUTANEOUS
  Administered 2020-06-25 – 2020-06-26 (×3): 3 [IU] via SUBCUTANEOUS
  Administered 2020-06-26: 2 [IU] via SUBCUTANEOUS
  Administered 2020-06-26: 3 [IU] via SUBCUTANEOUS
  Administered 2020-06-27: 2 [IU] via SUBCUTANEOUS
  Administered 2020-06-27: 5 [IU] via SUBCUTANEOUS
  Administered 2020-06-28 (×2): 2 [IU] via SUBCUTANEOUS
  Administered 2020-06-28: 3 [IU] via SUBCUTANEOUS

## 2020-06-19 MED ORDER — ATORVASTATIN CALCIUM 10 MG PO TABS
20.0000 mg | ORAL_TABLET | Freq: Every evening | ORAL | Status: DC
  Administered 2020-06-19 – 2020-06-28 (×9): 20 mg via ORAL
  Filled 2020-06-19 (×9): qty 2

## 2020-06-19 MED ORDER — SODIUM CHLORIDE 0.9% FLUSH
3.0000 mL | Freq: Two times a day (BID) | INTRAVENOUS | Status: DC
  Administered 2020-06-19 – 2020-06-28 (×13): 3 mL via INTRAVENOUS

## 2020-06-19 MED ORDER — LISINOPRIL 20 MG PO TABS
20.0000 mg | ORAL_TABLET | Freq: Every evening | ORAL | Status: DC
  Administered 2020-06-19: 20 mg via ORAL
  Filled 2020-06-19: qty 1

## 2020-06-19 MED ORDER — POLYETHYLENE GLYCOL 3350 17 G PO PACK: 17.0000 g | PACK | Freq: Every day | ORAL | Status: DC | PRN

## 2020-06-19 MED ORDER — PROSOURCE PLUS PO LIQD
30.0000 mL | Freq: Every day | ORAL | Status: DC
  Administered 2020-06-19 – 2020-06-21 (×3): 30 mL via ORAL
  Filled 2020-06-19 (×3): qty 30

## 2020-06-19 MED ORDER — INSULIN ASPART 100 UNIT/ML ~~LOC~~ SOLN
0.0000 [IU] | Freq: Every day | SUBCUTANEOUS | Status: DC
  Administered 2020-06-22 – 2020-06-24 (×2): 2 [IU] via SUBCUTANEOUS

## 2020-06-19 MED ORDER — GADOBUTROL 1 MMOL/ML IV SOLN
7.5000 mL | Freq: Once | INTRAVENOUS | Status: AC | PRN
  Administered 2020-06-19: 7.5 mL via INTRAVENOUS

## 2020-06-19 MED ORDER — CARVEDILOL 25 MG PO TABS
25.0000 mg | ORAL_TABLET | Freq: Two times a day (BID) | ORAL | Status: DC
  Administered 2020-06-19 – 2020-06-28 (×18): 25 mg via ORAL
  Filled 2020-06-19 (×18): qty 1

## 2020-06-19 MED ORDER — INSULIN ASPART 100 UNIT/ML ~~LOC~~ SOLN
4.0000 [IU] | Freq: Three times a day (TID) | SUBCUTANEOUS | Status: DC
  Administered 2020-06-19 – 2020-06-28 (×21): 4 [IU] via SUBCUTANEOUS

## 2020-06-19 MED ORDER — HYDROCODONE-ACETAMINOPHEN 5-325 MG PO TABS: 1.0000 | ORAL_TABLET | ORAL | Status: DC | PRN

## 2020-06-19 MED ORDER — ENOXAPARIN SODIUM 40 MG/0.4ML ~~LOC~~ SOLN
40.0000 mg | SUBCUTANEOUS | Status: DC
  Administered 2020-06-20: 40 mg via SUBCUTANEOUS
  Filled 2020-06-19 (×2): qty 0.4

## 2020-06-19 MED ORDER — PANTOPRAZOLE SODIUM 40 MG PO TBEC
40.0000 mg | DELAYED_RELEASE_TABLET | Freq: Every day | ORAL | Status: DC
  Administered 2020-06-19 – 2020-06-28 (×10): 40 mg via ORAL
  Filled 2020-06-19 (×10): qty 1

## 2020-06-19 MED ORDER — SODIUM CHLORIDE 0.9 % IV SOLN: 2.0000 g | INTRAVENOUS | Status: DC

## 2020-06-19 MED ORDER — ENSURE MAX PROTEIN PO LIQD
11.0000 [oz_av] | Freq: Every day | ORAL | Status: DC
  Administered 2020-06-19 – 2020-06-20 (×2): 11 [oz_av] via ORAL

## 2020-06-19 MED ORDER — SODIUM CHLORIDE 0.9 % IV SOLN: 1.0000 g | Freq: Once | INTRAVENOUS | Status: DC

## 2020-06-19 MED ORDER — VANCOMYCIN HCL 1250 MG/250ML IV SOLN
1250.0000 mg | INTRAVENOUS | Status: DC
  Filled 2020-06-19: qty 250

## 2020-06-19 MED ORDER — SODIUM CHLORIDE 0.9 % IV SOLN
2.0000 g | INTRAVENOUS | Status: DC
  Administered 2020-06-19 – 2020-06-22 (×4): 2 g via INTRAVENOUS
  Filled 2020-06-19 (×4): qty 20

## 2020-06-19 MED ORDER — METRONIDAZOLE 500 MG PO TABS
500.0000 mg | ORAL_TABLET | Freq: Three times a day (TID) | ORAL | Status: DC
  Administered 2020-06-19 – 2020-06-22 (×9): 500 mg via ORAL
  Filled 2020-06-19 (×9): qty 1

## 2020-06-19 MED ORDER — MORPHINE SULFATE (PF) 2 MG/ML IV SOLN: 2.0000 mg | INTRAVENOUS | Status: DC | PRN

## 2020-06-19 MED ORDER — DOCUSATE SODIUM 100 MG PO CAPS
100.0000 mg | ORAL_CAPSULE | Freq: Two times a day (BID) | ORAL | Status: DC
  Administered 2020-06-19 – 2020-06-20 (×2): 100 mg via ORAL
  Filled 2020-06-19 (×4): qty 1

## 2020-06-19 MED ORDER — PAROXETINE HCL 20 MG PO TABS
40.0000 mg | ORAL_TABLET | Freq: Every day | ORAL | Status: DC
  Administered 2020-06-19 – 2020-06-28 (×10): 40 mg via ORAL
  Filled 2020-06-19 (×10): qty 2

## 2020-06-19 MED ORDER — JUVEN PO PACK
1.0000 | PACK | Freq: Two times a day (BID) | ORAL | Status: DC
  Administered 2020-06-19 – 2020-06-28 (×16): 1 via ORAL
  Filled 2020-06-19 (×15): qty 1

## 2020-06-19 MED ORDER — BISACODYL 5 MG PO TBEC: 5.0000 mg | DELAYED_RELEASE_TABLET | Freq: Every day | ORAL | Status: DC | PRN

## 2020-06-19 MED ORDER — INSULIN GLARGINE 100 UNIT/ML ~~LOC~~ SOLN
40.0000 [IU] | Freq: Every day | SUBCUTANEOUS | Status: DC
  Administered 2020-06-19 – 2020-06-20 (×2): 40 [IU] via SUBCUTANEOUS
  Filled 2020-06-19 (×4): qty 0.4

## 2020-06-19 MED ORDER — LACTATED RINGERS IV SOLN: INTRAVENOUS | Status: DC

## 2020-06-19 MED ORDER — VANCOMYCIN HCL 1250 MG/250ML IV SOLN
1250.0000 mg | INTRAVENOUS | Status: DC
  Administered 2020-06-19: 1250 mg via INTRAVENOUS
  Filled 2020-06-19 (×2): qty 250

## 2020-06-19 MED ORDER — ONDANSETRON HCL 4 MG/2ML IJ SOLN: 4.0000 mg | Freq: Four times a day (QID) | INTRAMUSCULAR | Status: DC | PRN

## 2020-06-19 MED ORDER — HYDRALAZINE HCL 20 MG/ML IJ SOLN: 5.0000 mg | INTRAMUSCULAR | Status: DC | PRN

## 2020-06-19 MED ORDER — ACETAMINOPHEN 650 MG RE SUPP: 650.0000 mg | Freq: Four times a day (QID) | RECTAL | Status: DC | PRN

## 2020-06-19 NOTE — H&P (Signed)
History and Physical    [name]:[id number] DOB: [birth year] DOA: 06/18/2020  PCP: Marton Redwood, MD Consultants:  Celestia Khat - vascular surgery; Irish Lack - cardiology; Hewitt - orthopedics Patient coming from:  Home - lives alone (husband is in [place]); NOK: Sunny Schlein, [phone]; [phone]  Chief Complaint: foot infection  HPI: [name] is a 86 y.o. female with medical history significant of PVD; hypothyroidism; HTN; DM; CAD; and chronic combined CHF presenting with a foot infection. She had a R TMA on 8/26.  It wasn't healing well and so Dr. Doran Durand sent her to the wound care center.  She was placed on a wound vac and it was doing very well.  Suddenly, it got worse again and they aren't sure why.   She has HHN change it MWF.  No systemic symptoms - no fever, chills.  The nurse reported a concern last Friday.  She tried to call ortho, GP, and wound care.  She was able to get antibiotics on Monday but it has worsened despite antibiotics.  Dr. Leonette Monarch offered change in antibiotics and home but she doesn't want to lose more of her foot and so preferred admission for inpatient antibiotics.    ED Course:  Carryover, per Dr. Nevada Crane:  86 y.o. female with a history of diabetes and peripheral vascular disease requiring right midfoot amputation in 02/2020, here for right foot infection, failed outpatient therapy. Was doing well up until 1 week ago. She was on wound VAC until last week, when they noted it was getting infected. Started on Doxy 6 days ago. Reports compliance with her antibiotics.   Review of Systems: As per HPI; otherwise review of systems reviewed and negative.   Ambulatory Status:  Ambulates with a walker  COVID Vaccine Status:  Complete plus booster  Past Medical History:  Diagnosis Date  . Anxiety   . Cellulitis 10/2015   LEFT FOOT  . CHF (congestive heart failure) ([place])   . Complication of anesthesia   . Coronary artery disease   .  Diabetes mellitus without complication (HCC)    insulin dependent  . GERD (gastroesophageal reflux disease)   . Hypertension   . Hypothyroidism   . Neuromuscular disorder (HCC)    muscle cramps to lower extremities  . Other primary cardiomyopathies   . Peripheral vascular disease ([place])   . PONV (postoperative nausea and vomiting)   . Shortness of breath     Past Surgical History:  Procedure Laterality Date  . ABDOMINAL AORTOGRAM N/A 09/03/2018   Procedure: ABDOMINAL AORTOGRAM;  Surgeon: Serafina Mitchell, MD;  Location: [place];  Service: Cardiovascular;  Laterality: N/A;  . ABDOMINAL AORTOGRAM W/LOWER EXTREMITY N/A 04/16/2018   Procedure: ABDOMINAL AORTOGRAM W/LOWER EXTREMITY;  Surgeon: Serafina Mitchell, MD;  Location: [place] [facility];  Service: Cardiovascular;  Laterality: N/A;  unilateral  . ABDOMINAL AORTOGRAM W/LOWER EXTREMITY Bilateral 04/08/2020   Procedure: ABDOMINAL AORTOGRAM W/LOWER EXTREMITY;  Surgeon: Serafina Mitchell, MD;  Location: [place] [facility];  Service: Cardiovascular;  Laterality: Bilateral;  . ABDOMINAL HYSTERECTOMY    . AMPUTATION Left 12/03/2018   Procedure: Left 3rd ray amputation;  Surgeon: Wylene Simmer, MD;  Location: [facility];  Service: Orthopedics;  Laterality: Left;  14mn  . CHOLECYSTECTOMY    . CORONARY ANGIOPLASTY WITH STENT PLACEMENT  08/09/2011   DES  to mid circumflex  . I & D EXTREMITY Left 10/29/2015   Procedure: Irrigation and Debridement Left Foot;  Surgeon: Newt Minion, MD;  Location: [facility];  Service: Orthopedics;  Laterality: Left;  . LEFT HEART CATHETERIZATION WITH CORONARY ANGIOGRAM N/A 08/08/2012   Procedure: LEFT HEART CATHETERIZATION WITH CORONARY ANGIOGRAM;  Surgeon: [name], MD;  Location: [facility];  Service: Cardiovascular;  Laterality: N/A;  . PERIPHERAL VASCULAR ATHERECTOMY  04/16/2018   Procedure: PERIPHERAL VASCULAR ATHERECTOMY;  Surgeon: Serafina Mitchell, MD;  Location: [place] [facility];   Service: Cardiovascular;;  lt. Peroneal  . PERIPHERAL VASCULAR ATHERECTOMY Right 04/08/2020   Procedure: PERIPHERAL VASCULAR ATHERECTOMY;  Surgeon: Serafina Mitchell, MD;  Location: [place] [facility];  Service: Cardiovascular;  Laterality: Right;  SFA and Peroneal  . PERIPHERAL VASCULAR BALLOON ANGIOPLASTY  04/16/2018   Procedure: PERIPHERAL VASCULAR BALLOON ANGIOPLASTY;  Surgeon: Serafina Mitchell, MD;  Location: [place] [facility];  Service: Cardiovascular;;  lt. sfa and AT  . PERIPHERAL VASCULAR BALLOON ANGIOPLASTY Left 09/03/2018   Procedure: PERIPHERAL VASCULAR BALLOON ANGIOPLASTY;  Surgeon: Serafina Mitchell, MD;  Location: [place] [facility];  Service: Cardiovascular;  Laterality: Left;  TP TRUNK  . PERIPHERAL VASCULAR BALLOON ANGIOPLASTY Right 04/08/2020   Procedure: PERIPHERAL VASCULAR BALLOON ANGIOPLASTY;  Surgeon: Serafina Mitchell, MD;  Location: [facility];  Service: Cardiovascular;  Laterality: Right;  SFA (DCB), Peroneal  . PERIPHERAL VASCULAR CATHETERIZATION N/A 12/30/2014   Procedure: Lower Extremity Angiography;  Surgeon: Wellington Hampshire, MD;  Location: [facility] [facility];  Service: Cardiovascular;  Laterality: N/A;  . PERIPHERAL VASCULAR INTERVENTION Left 09/03/2018   Procedure: PERIPHERAL VASCULAR INTERVENTION;  Surgeon: Serafina Mitchell, MD;  Location: [facility];  Service: Cardiovascular;  Laterality: Left;  SFA STENT   . TENDON RELEASE Right 03/11/2020   Procedure: Heel Cord Lengthening;  Surgeon: Wylene Simmer, MD;  Location: [facility];  Service: Orthopedics;  Laterality: Right;  . THYROID SURGERY     radioactive iodine   . TONSILLECTOMY    . TRANSMETATARSAL AMPUTATION Right 03/11/2020   Procedure: Right foot transmetatarsal amputation;  Surgeon: Wylene Simmer, MD;  Location: [facility];  Service: Orthopedics;  Laterality: Right;    Social History   Socioeconomic History  . Marital status: Married    Spouse name: Not on file  .  Number of children: Not on file  . Years of education: Not on file  . Highest education level: Not on file  Occupational History  . Occupation: retired  Tobacco Use  . Smoking status: Former Smoker    Quit date: 11/28/1960    Years since quitting: 59.5  . Smokeless tobacco: Never Used  Vaping Use  . Vaping Use: Never used  Substance and Sexual Activity  . Alcohol use: Yes    Alcohol/week: 0.0 standard drinks    Comment: rare  . Drug use: No  . Sexual activity: Not on file  Other Topics Concern  . Not on file  Social History Narrative  . Not on file   Social Determinants of Health   Financial Resource Strain:   . Difficulty of Paying Living Expenses: Not on file  Food Insecurity: No Food Insecurity  . Worried About Charity fundraiser in the Last Year: Never true  . Ran Out of Food in the Last Year: Never true  Transportation Needs: No Transportation Needs  . Lack of Transportation (Medical): No  . Lack of Transportation (Non-Medical): No  Physical Activity:   . Days of Exercise per Week: Not on file  .  Minutes of Exercise per Session: Not on file  Stress:   . Feeling of Stress : Not on file  Social Connections:   . Frequency of Communication with Friends and Family: Not on file  . Frequency of Social Gatherings with Friends and Family: Not on file  . Attends Religious Services: Not on file  . Active Member of Clubs or Organizations: Not on file  . Attends Archivist Meetings: Not on file  . Marital Status: Not on file  Intimate Partner Violence:   . Fear of Current or Ex-Partner: Not on file  . Emotionally Abused: Not on file  . Physically Abused: Not on file  . Sexually Abused: Not on file    Allergies  Allergen Reactions  . Sulfa Antibiotics Nausea And Vomiting    Severe vomiting.     Family History  Problem Relation Age of Onset  . Heart disease Father   . Heart attack Father   . Diabetes Sister   . Heart disease Son        before age 33  .  Heart attack 75        86yrold  . Sudden death Grandchild   . Hypertension Neg Hx     Prior to Admission medications   Medication Sig Start Date End Date Taking? Authorizing Provider  acetaminophen (TYLENOL) 325 MG tablet Take 2 tablets (650 mg total) by mouth every 4 (four) hours as needed for headache or mild pain. 03/19/20  Yes RDebbe Odea MD  aspirin EC 81 MG EC tablet Take 1 tablet (81 mg total) by mouth daily. Swallow whole. Patient taking differently: Take 325 mg by mouth at bedtime. Swallow whole. 03/20/20  Yes RDebbe Odea MD  atorvastatin (LIPITOR) 20 MG tablet Take 1 tablet (20 mg total) by mouth daily. Please keep upcoming appt in July with Dr. VIrish Lackbefore anymore refills. Thank you Patient taking differently: Take 20 mg by mouth every evening.  01/05/20  Yes VJettie Booze MD  b complex vitamins capsule Take 1 capsule by mouth daily.   Yes [provider]  Biotin w/ Vitamins C & E (HAIR SKIN & NAILS GUMMIES PO) Take 2 tablets by mouth daily. gummy   Yes [provider]  Calcium-Magnesium-Zinc (CAL-MAG-ZINC PO) Take 1 tablet by mouth daily.   Yes [provider]  carvedilol (COREG) 25 MG tablet Take 1 tablet (25 mg total) by mouth 2 (two) times daily with a meal. *Please call and schedule an appointment with Dr AFletcher Anon Patient taking differently: Take 25 mg by mouth at bedtime.  07/18/16  Yes AWellington Hampshire MD  cholecalciferol (VITAMIN D) 1000 units tablet Take 2,000 Units by mouth daily.    Yes [provider]  clopidogrel (PLAVIX) 75 MG tablet TAKE 1 TABLET BY MOUTH ONCE DAILY Patient taking differently: Take 75 mg by mouth at bedtime.  05/12/20  Yes BSerafina Mitchell MD  Coenzyme Q10 (CO Q 10) 100 MG CAPS Take 300 mg by mouth daily.    Yes [provider]  doxycycline (VIBRA-TABS) 100 MG tablet 100 mg 2 (two) times daily.    Yes [provider]  ergocalciferol (VITAMIN D2) 1.25 MG (50000 UT) capsule Take  50,000 Units by mouth every Friday.    Yes [provider]  furosemide (LASIX) 40 MG tablet Take 1 tablet (40 mg total) by mouth daily. 05/03/20  Yes VJettie Booze MD  insulin aspart (NOVOLOG) 100 UNIT/ML injection Inject 10 Units into the  skin 3 (three) times daily with meals. 03/19/20  Yes Debbe Odea, MD  insulin glargine (LANTUS) 100 UNIT/ML injection Inject 0.35 mLs (35 Units total) into the skin every evening. Patient taking differently: Inject 70 Units into the skin at bedtime.  03/19/20  Yes Debbe Odea, MD  insulin lispro (HUMALOG) 100 UNIT/ML injection Inject 10-20 Units into the skin See admin instructions. Take 10 units at breakfast, 20 units at lunch and dinner   Yes [provider]  iron polysaccharides (NIFEREX) 150 MG capsule Take 150 mg by mouth daily.    Yes [provider]  lisinopril (PRINIVIL,ZESTRIL) 20 MG tablet Take 20 mg by mouth every evening.  10/29/15  Yes [provider]  nitroGLYCERIN (NITROSTAT) 0.4 MG SL tablet PLACE 1 TABLET UNDER THE TONGUE EVERY 5 MINUTES AS NEEDED FOR CHEST PAIN Patient taking differently: Place 0.4 mg under the tongue every 5 (five) minutes as needed for chest pain. PLACE 1 TABLET UNDER THE TONGUE EVERY 5 MINUTES AS NEEDED FOR CHEST PAIN 09/19/19  Yes [name], MD  pantoprazole (PROTONIX) 40 MG tablet TAKE 1 TABLET BY MOUTH ONCE DAILY AT 6 AM Patient taking differently: Take 40 mg by mouth daily.  03/30/20  Yes [name], MD  PARoxetine (PAXIL) 40 MG tablet Take 40 mg by mouth at bedtime.    Yes [provider]  polyethylene glycol (MIRALAX / GLYCOLAX) 17 g packet Take 17 g by mouth daily as needed for mild constipation. 03/19/20  Yes Debbe Odea, MD  potassium chloride SA (KLOR-CON) 20 MEQ tablet Take 1 tablet (20 mEq total) by mouth daily. 05/03/20  Yes [name], MD  collagenase (SANTYL) ointment Santyl 250 unit/gram topical ointment Patient not taking: Reported  on 06/19/2020    [provider]  gabapentin (NEURONTIN) 300 MG capsule Take 1 capsule (300 mg total) by mouth 2 (two) times daily. Patient not taking: Reported on 06/19/2020 03/19/20   Debbe Odea, MD    Physical Exam: Vitals:   06/19/20 1149 06/19/20 1155 06/19/20 1157 06/19/20 1502  BP:  (!) 149/59 (!) 137/59 139/61  Pulse:  80 81 84  Resp:  18  18  Temp:  98.4 F (36.9 C)  98.1 F (36.7 C)  TempSrc:  Oral  Oral  SpO2:  98%  96%  Weight: 75.9 kg     Height: '5\' 8"'  (1.727 m)        . General:  Appears calm and comfortable and is NAD . Eyes:  PERRL, EOMI, normal lids, iris . ENT:  grossly normal hearing, lips & tongue, mmm . Neck:  no LAD, masses or thyromegaly . Cardiovascular:  RRR, no m/r/g. No LE edema.  Marland Kitchen Respiratory:   CTA bilaterally with no wheezes/rales/rhonchi.  Normal respiratory effort. . Abdomen:  soft, NT, ND, NABS . Skin:  no rash or induration seen on limited exam other than as below . Musculoskeletal: RLE s/p TMA with ulceration along the plantar surface with purulent drainage and surrounding erythema along the plantar foot and extending onto the dorsal surface        . Psychiatric:  grossly normal mood and affect, speech fluent and appropriate, AOx3 . Neurologic:  CN 2-12 grossly intact, moves all extremities in coordinated fashion    Radiological Exams on Admission: Independently reviewed - see discussion in A/P where applicable  MR FOOT RIGHT W WO CONTRAST  Result Date: 06/19/2020 CLINICAL DATA:  Medial plantar wound along the amputation site. EXAM: MRI OF THE RIGHT  FOREFOOT WITHOUT AND WITH CONTRAST TECHNIQUE: Multiplanar, multisequence MR imaging of the right forefoot was performed before and after the administration of intravenous contrast. CONTRAST:  7.66m GADAVIST GADOBUTROL 1 MMOL/ML IV SOLN COMPARISON:  10/28/2015 FINDINGS: Bones/Joint/Cartilage Prior transmetatarsal amputation through the base of the metatarsals. Soft tissue wound  along the plantar medial aspect of the amputation site with severe surrounding soft tissue edema. Bone marrow edema in the plantar medial aspect of the medial cuneiform with cortical destruction consistent with osteomyelitis. Bone marrow edema in the residual bases of the metatarsals which may reflect reactive postsurgical changes versus osteomyelitis. No acute fracture or dislocation. Normal alignment. No joint effusion. Osteoarthritis of the talonavicular joint, calcaneocuboid joint and cuneiform-navicular joint. Moderate osteoarthritis of the tibiotalar joint with high-grade partial-thickness cartilage loss and subchondral reactive marrow edema. Small plantar calcaneal spur. Enthesopathic changes of the Achilles tendon insertion. Ligaments, Muscles and Tendons Flexor extensor and peroneal tendons demonstrate no focal abnormality. Disruption of the distal aspect of the plantar fascia consistent with postsurgical changes. Along the superficial aspect of the medial band of the plantar fascia at the level of the navicular there is a 2.3 x 3 x 3.4 cm complex fluid collection with a sinus tract to the wound along the plantar medial aspect consistent with an abscess. Severe tendinosis of the proximal in mid Achilles tendon with a partial tear. Soft tissue Along the superficial aspect of the medial band of the plantar fascia at the level of the navicular there is a 2.3 x 3 x 3.4 cm complex fluid collection with a sinus tract to the wound along the plantar medial aspect consistent with an abscess. Surrounding soft tissue edema likely reflecting cellulitis. IMPRESSION: 1. Prior transmetatarsal amputation through the base of the metatarsals. Soft tissue wound along the plantar medial aspect of the amputation site with severe surrounding soft tissue edema likely reflecting cellulitis. Bone marrow edema in the plantar medial aspect of the medial cuneiform with cortical destruction consistent with osteomyelitis. 2. Along the  superficial aspect of the medial band of the plantar fascia at the level of the navicular there is a 2.3 x 3 x 3.4 cm complex fluid collection with a sinus tract to the wound along the plantar medial aspect consistent with an abscess. 3. Bone marrow edema in the residual bases of the metatarsals which may reflect reactive postsurgical changes versus osteomyelitis. 4. Severe tendinosis of the proximal in mid Achilles tendon with a partial tear. Electronically Signed   By: HKathreen Devoid  On: 06/19/2020 11:46   DG Foot Complete Right  Result Date: 06/18/2020 CLINICAL DATA:  86year old female with infection of the right foot. EXAM: RIGHT FOOT COMPLETE - 3+ VIEW COMPARISON:  Right foot radiograph dated 05/24/2017. FINDINGS: Status post transmetatarsal amputation. There is no acute fracture or dislocation. The bones are osteopenic. Postsurgical changes and soft tissue thickening of the foot over the amputation. No radiopaque foreign object or soft tissue gas. Overlying cast or dressing noted. IMPRESSION: Status post transmetatarsal amputation. No acute fracture or dislocation. Electronically Signed   By: AAnner CreteM.D.   On: 06/18/2020 15:55    EKG: Not done   Labs on Admission: I have personally reviewed the available labs and imaging studies at the time of the admission.  Pertinent labs:   Albumin 3.1 Lactate 1.2 WBC 14.3 Hgb 9.8 Platelets 443 UA WNL   Assessment/Plan Principal Problem:   Diabetic infection of right foot ([facility] Active Problems:   Coronary artery disease involving native coronary  artery of native heart   Essential hypertension   Diabetes mellitus with peripheral vascular disease (HCC)   Mixed hyperlipidemia   Chronic diastolic heart failure (HCC)   Diabetic infection of R foot -She has had a prolonged R foot infection that has failed outpatient therapy -Most recently she was given Doxycycline without improvement -She is already s/p TMA and has been hoping to  avoid additional amputation -Foot ulcer is present and draining and now the patient has developed surrounding cellulitis -Negative lactate, no current concerns for sepsis -Will treat with IV antibiotics (Rocephin/Flagyl/Vanc as per the diabetic foot ulcer algorithm) -MRI was ordered and shows osteomyelitis of the cuneiform and probably metatarsals and also abscess along the navicular bone -I spoke with Dr. Doran Durand, who knows her well; he will plan to perform BKA on Tuesday, 12/7 -Patient has h/o PVD and so I have ordered ABIs in case revascularization may be indicated; she reports that prior efforts at revascularization has been very successful and she has great blood flow -LE wound order set utilized including labs (CRP, ESR, A1c, prealbumin, HIV, and blood cultures) and consults (TOC team; diabetes coordinator; peripheral vascular navigator; wound care; and nutrition)  CAD/Chronic diastolic CHF -Hold ASA, Plavix pending surgery -Hold Lasix for now, appears compensated and has infection -Continue BB, statin, ACE  DM -A1c was 8.3 on 8/28, indicating poor control -Continue Lantus, but decrease dose from 70 units to 40 for now -Cover with moderate-scale SSI  HTN -Continue Coreg, Lisinopril  HLD -Continue Lipitor    Note: This patient has been tested and is negative for the novel coronavirus COVID-19. The patient has been fully vaccinated against COVID-19.    DVT prophylaxis:  Lovenox  Code Status:  DNR - confirmed with patient Family Communication: None present; I offered to call her son but she reported that she would call and discuss with him instead. Disposition Plan:  The patient is from: home  Anticipated d/c is to: SNF based on need for BKA; she would prefer home with [facility] if possible  Anticipated d/c date will depend on clinical response to treatment, but possibly as early as tomorrow if she has excellent response to treatment  Patient is currently: acutely ill Consults  called: Orthopedics; TOC team; diabetes coordinator; peripheral vascular navigator; wound care; and nutrition Admission status:  Admit - It is my clinical opinion that admission to INPATIENT is reasonable and necessary because of the expectation that this patient will require hospital care that crosses at least 2 midnights to treat this condition based on the medical complexity of the problems presented.  Given the aforementioned information, the predictability of an adverse outcome is felt to be significant.    Karmen Bongo MD [facility]   How to contact the [facility] Attending or Consulting provider [place] or covering provider during after hours [place], for this patient?  1. Check the care team in [facility] and look for a) attending/consulting TR[facility] provider listed and b) the [facility] team listed 2. Log into [email or web address] and use [place] universal password to access. If you do not have the password, please contact the hospital operator. 3. Locate the [facility] provider you are looking for under [facility] and page to a number that you can be directly reached. 4. If you still have difficulty reaching the provider, please page the [facility] (Director on Call) for the Hospitalists listed on amion for assistance.   06/19/2020, 6:04 PM

## 2020-06-19 NOTE — Consult Note (Signed)
Reason for Consult:  Right foot diabetic ulcer Referring Physician: Dr. Larene Beach [name] is an 86 y.o. female.  HPI: Mrs. Helbig is an 65 y /o female known to me from recurrent diabetic foot ulcers on the right.  She is s/p R transmet amputation 3.5 months ago.  She has been going to the wound center for treatment of delayed healing of the plantar medial incision.  She developed drainage from the wound over the last week or so.  I prescribed oral doxy earlier this week.  She denies any increase in pain at the foot, fever, chills, nausea or vomiting.  She was sent to the ER by her [facility] RN due to worsening appearance of the wound despite abx.  She is admitted and is on IV abx. - vanc and rocephin.  PMH is significant for PAD and diabetes.  Past Medical History:  Diagnosis Date  . Anxiety   . Cellulitis 10/2015   LEFT FOOT  . CHF (congestive heart failure) ([place])   . Complication of anesthesia   . Coronary artery disease   . Diabetes mellitus without complication (HCC)    insulin dependent  . GERD (gastroesophageal reflux disease)   . Hypertension   . Hypothyroidism   . Neuromuscular disorder (HCC)    muscle cramps to lower extremities  . Other primary cardiomyopathies   . Peripheral vascular disease ([place])   . PONV (postoperative nausea and vomiting)   . Shortness of breath     Past Surgical History:  Procedure Laterality Date  . ABDOMINAL AORTOGRAM N/A 09/03/2018   Procedure: ABDOMINAL AORTOGRAM;  Surgeon: Serafina Mitchell, MD;  Location: [place] [facility];  Service: Cardiovascular;  Laterality: N/A;  . ABDOMINAL AORTOGRAM W/LOWER EXTREMITY N/A 04/16/2018   Procedure: ABDOMINAL AORTOGRAM W/LOWER EXTREMITY;  Surgeon: Serafina Mitchell, MD;  Location: [place] [facility];  Service: Cardiovascular;  Laterality: N/A;  unilateral  . ABDOMINAL AORTOGRAM W/LOWER EXTREMITY Bilateral 04/08/2020   Procedure: ABDOMINAL AORTOGRAM W/LOWER EXTREMITY;  Surgeon: Serafina Mitchell, MD;  Location: [place]  [facility];  Service: Cardiovascular;  Laterality: Bilateral;  . ABDOMINAL HYSTERECTOMY    . AMPUTATION Left 12/03/2018   Procedure: Left 3rd ray amputation;  Surgeon: Wylene Simmer, MD;  Location: [place];  Service: Orthopedics;  Laterality: Left;  63min  . CHOLECYSTECTOMY    . CORONARY ANGIOPLASTY WITH STENT PLACEMENT  08/09/2011   DES  to mid circumflex  . I & D EXTREMITY Left 10/29/2015   Procedure: Irrigation and Debridement Left Foot;  Surgeon: Newt Minion, MD;  Location: [place];  Service: Orthopedics;  Laterality: Left;  . LEFT HEART CATHETERIZATION WITH CORONARY ANGIOGRAM N/A 08/08/2012   Procedure: LEFT HEART CATHETERIZATION WITH CORONARY ANGIOGRAM;  Surgeon: [name], MD;  Location: [facility] CATH LAB;  Service: Cardiovascular;  Laterality: N/A;  . PERIPHERAL VASCULAR ATHERECTOMY  04/16/2018   Procedure: PERIPHERAL VASCULAR ATHERECTOMY;  Surgeon: Serafina Mitchell, MD;  Location: [place] [facility];  Service: Cardiovascular;;  lt. Peroneal  . PERIPHERAL VASCULAR ATHERECTOMY Right 04/08/2020   Procedure: PERIPHERAL VASCULAR ATHERECTOMY;  Surgeon: Serafina Mitchell, MD;  Location: [place] [facility];  Service: Cardiovascular;  Laterality: Right;  SFA and Peroneal  . PERIPHERAL VASCULAR BALLOON ANGIOPLASTY  04/16/2018   Procedure: PERIPHERAL VASCULAR BALLOON ANGIOPLASTY;  Surgeon: Serafina Mitchell, MD;  Location: [place] [facility];  Service: Cardiovascular;;  lt. sfa and AT  . PERIPHERAL VASCULAR BALLOON ANGIOPLASTY Left 09/03/2018   Procedure:  PERIPHERAL VASCULAR BALLOON ANGIOPLASTY;  Surgeon: Serafina Mitchell, MD;  Location: [place] [facility];  Service: Cardiovascular;  Laterality: Left;  TP TRUNK  . PERIPHERAL VASCULAR BALLOON ANGIOPLASTY Right 04/08/2020   Procedure: PERIPHERAL VASCULAR BALLOON ANGIOPLASTY;  Surgeon: Serafina Mitchell, MD;  Location: [place] [facility];  Service: Cardiovascular;  Laterality: Right;  SFA (DCB), Peroneal  . PERIPHERAL VASCULAR CATHETERIZATION  N/A 12/30/2014   Procedure: Lower Extremity Angiography;  Surgeon: Wellington Hampshire, MD;  Location: [place] [facility];  Service: Cardiovascular;  Laterality: N/A;  . PERIPHERAL VASCULAR INTERVENTION Left 09/03/2018   Procedure: PERIPHERAL VASCULAR INTERVENTION;  Surgeon: Serafina Mitchell, MD;  Location: [place] [facility];  Service: Cardiovascular;  Laterality: Left;  SFA STENT   . TENDON RELEASE Right 03/11/2020   Procedure: Heel Cord Lengthening;  Surgeon: Wylene Simmer, MD;  Location: [place];  Service: Orthopedics;  Laterality: Right;  . THYROID SURGERY     radioactive iodine   . TONSILLECTOMY    . TRANSMETATARSAL AMPUTATION Right 03/11/2020   Procedure: Right foot transmetatarsal amputation;  Surgeon: Wylene Simmer, MD;  Location: [place];  Service: Orthopedics;  Laterality: Right;    Family History  Problem Relation Age of Onset  . Heart disease Father   . Heart attack Father   . Diabetes Sister   . Heart disease Son        before age 42  . Heart attack 58        84yr old  . Sudden death Grandchild   . Hypertension Neg Hx     Social History:  reports that she quit smoking about 59 years ago. She has never used smokeless tobacco. She reports current alcohol use. She reports that she does not use drugs.  Allergies:  Allergies  Allergen Reactions  . Sulfa Antibiotics Nausea And Vomiting    Severe vomiting.     Medications: I have reviewed the patient's current medications.  Results for orders placed or performed during the hospital encounter of 06/18/20 (from the past 48 hour(s))  Lactic acid, plasma     Status: None   Collection Time: 06/18/20  3:13 PM  Result Value Ref Range   Lactic Acid, Venous 1.2 0.5 - 1.9 mmol/L    Comment: Performed at [facility], [place] [address] [place],  [address]  Comprehensive metabolic panel     Status: Abnormal   Collection Time: 06/18/20  3:13 PM  Result Value Ref Range   Sodium 137  135 - 145 mmol/L   Potassium 4.2 3.5 - 5.1 mmol/L   Chloride 100 98 - 111 mmol/L   CO2 25 22 - 32 mmol/L   Glucose, Bld 76 70 - 99 mg/dL    Comment: Glucose reference range applies only to samples taken after fasting for at least 8 hours.   BUN 15 8 - 23 mg/dL   Creatinine, Ser 0.68 0.44 - 1.00 mg/dL   Calcium 9.3 8.9 - 10.3 mg/dL   Total Protein 6.8 6.5 - 8.1 g/dL   Albumin 3.1 (L) 3.5 - 5.0 g/dL   AST 19 15 - 41 U/L   ALT 15 0 - 44 U/L   Alkaline Phosphatase 66 38 - 126 U/L   Total Bilirubin 0.4 0.3 - 1.2 mg/dL   GFR, Estimated >60 >60 mL/min    Comment: (NOTE) Calculated using the CKD-EPI Creatinine Equation (2021)    Anion gap 12 5 - 15    Comment: Performed  at [facility], [place] [address], [place], [place] [address]  CBC with Differential     Status: Abnormal   Collection Time: 06/18/20  3:13 PM  Result Value Ref Range   WBC 14.3 (H) 4.0 - 10.5 K/uL   RBC 3.66 (L) 3.87 - 5.11 MIL/uL   Hemoglobin 9.8 (L) 12.0 - 15.0 g/dL   HCT 31.3 (L) 36 - 46 %   MCV 85.5 80.0 - 100.0 fL   MCH 26.8 26.0 - 34.0 pg   MCHC 31.3 30.0 - 36.0 g/dL   RDW 15.3 11.5 - 15.5 %   Platelets 443 (H) 150 - 400 K/uL   nRBC 0.0 0.0 - 0.2 %   Neutrophils Relative % 71 %   Neutro Abs 10.2 (H) 1.7 - 7.7 K/uL   Lymphocytes Relative 15 %   Lymphs Abs 2.2 0.7 - 4.0 K/uL   Monocytes Relative 9 %   Monocytes Absolute 1.3 (H) 0.1 - 1.0 K/uL   Eosinophils Relative 4 %   Eosinophils Absolute 0.5 0.0 - 0.5 K/uL   Basophils Relative 0 %   Basophils Absolute 0.1 0.0 - 0.1 K/uL   Immature Granulocytes 1 %   Abs Immature Granulocytes 0.07 0.00 - 0.07 K/uL    Comment: Performed at [facility], [place] [address] [place], [place] [address]  Urinalysis, Routine w reflex microscopic     Status: None   Collection Time: 06/18/20  8:16 PM  Result Value Ref Range   Color, Urine YELLOW YELLOW   APPearance CLEAR CLEAR   Specific Gravity, Urine 1.015 1.005 - 1.030   pH 5.0 5.0 - 8.0   Glucose, UA NEGATIVE  NEGATIVE mg/dL   Hgb urine dipstick NEGATIVE NEGATIVE   Bilirubin Urine NEGATIVE NEGATIVE   Ketones, ur NEGATIVE NEGATIVE mg/dL   Protein, ur NEGATIVE NEGATIVE mg/dL   Nitrite NEGATIVE NEGATIVE   Leukocytes,Ua NEGATIVE NEGATIVE    Comment: Performed at [facility] [address], [place], [place] [address]  Resp Panel by RT-PCR (Flu A&B, Covid) Nasopharyngeal Swab     Status: None   Collection Time: 06/19/20  6:45 AM   Specimen: Nasopharyngeal Swab; Nasopharyngeal(NP) swabs in vial transport medium  Result Value Ref Range   SARS Coronavirus 2 by RT PCR NEGATIVE NEGATIVE    Comment: (NOTE) SARS-CoV-2 target nucleic acids are NOT DETECTED.  The SARS-CoV-2 RNA is generally detectable in upper respiratory specimens during the acute phase of infection. The lowest concentration of SARS-CoV-2 viral copies this assay can detect is 138 copies/mL. A negative result does not preclude SARS-Cov-2 infection and should not be used as the sole basis for treatment or other patient management decisions. A negative result may occur with  improper specimen collection/handling, submission of specimen other than nasopharyngeal swab, presence of viral mutation(s) within the areas targeted by this assay, and inadequate number of viral copies(<138 copies/mL). A negative result must be combined with clinical observations, patient history, and epidemiological information. The expected result is Negative.  Fact Sheet for Patients:  EntrepreneurPulse.com.au  Fact Sheet for Healthcare Providers:  IncredibleEmployment.be  This test is no t yet approved or cleared by the [place] FDA and  has been authorized for detection and/or diagnosis of SARS-CoV-2 by FDA under an Emergency Use Authorization (EUA). This EUA will remain  in effect (meaning this test can be used) for the duration of the COVID-19 declaration under Section 564(b)(1) of the Act, 21 U.S.C.section  360bbb-3(b)(1), unless the authorization is terminated  or revoked sooner.       Influenza A by PCR NEGATIVE NEGATIVE   Influenza B by PCR NEGATIVE NEGATIVE    Comment: (NOTE) The Xpert Xpress SARS-CoV-2/FLU/RSV plus assay is intended as an aid in the diagnosis of influenza from Nasopharyngeal swab specimens and should not be used as a sole basis for treatment. Nasal washings and aspirates are unacceptable for Xpert Xpress SARS-CoV-2/FLU/RSV testing.  Fact Sheet for Patients: EntrepreneurPulse.com.au  Fact Sheet for Healthcare Providers: IncredibleEmployment.be  This test is not yet approved or cleared by the [place] FDA and has been authorized for detection and/or diagnosis of SARS-CoV-2 by FDA under an Emergency Use Authorization (EUA). This EUA will remain in effect (meaning this test can be used) for the duration of the COVID-19 declaration under Section 564(b)(1) of the Act, 21 U.S.C. section 360bbb-3(b)(1), unless the authorization is terminated or revoked.  Performed at [facility], [place] [address] [place], [place] [address]   Glucose, capillary     Status: Abnormal   Collection Time: 06/19/20 12:11 PM  Result Value Ref Range   Glucose-Capillary 198 (H) 70 - 99 mg/dL    Comment: Glucose reference range applies only to samples taken after fasting for at least 8 hours.  Sedimentation rate     Status: Abnormal   Collection Time: 06/19/20  1:04 PM  Result Value Ref Range   Sed Rate 96 (H) 0 - 22 mm/hr    Comment: Performed at [facility] [address], [place], [place] [address]  C-reactive protein     Status: Abnormal   Collection Time: 06/19/20  1:04 PM  Result Value Ref Range   CRP 8.3 (H) <1.0 mg/dL    Comment: Performed at [facility] [address], [place], [place] [address]  Prealbumin     Status: Abnormal   Collection Time: 06/19/20  1:04 PM  Result Value Ref Range   Prealbumin 10.8 (L) 18 -  38 mg/dL    Comment: Performed at [facility] [address], [place], [place] [address]  Glucose, capillary     Status: Abnormal   Collection Time: 06/19/20  4:51 PM  Result Value Ref Range   Glucose-Capillary 170 (H) 70 - 99 mg/dL    Comment: Glucose reference range applies only to samples taken after fasting for at least 8 hours.    MR FOOT RIGHT W WO CONTRAST  Result Date: 06/19/2020 CLINICAL DATA:  Medial plantar wound along the amputation site. EXAM: MRI OF THE RIGHT FOREFOOT WITHOUT AND WITH CONTRAST TECHNIQUE: Multiplanar, multisequence MR imaging of the right forefoot was performed before and after the administration of intravenous contrast. CONTRAST:  7.44mL GADAVIST GADOBUTROL 1 MMOL/ML IV SOLN COMPARISON:  10/28/2015 FINDINGS: Bones/Joint/Cartilage Prior transmetatarsal amputation through the base of the metatarsals. Soft tissue wound along the plantar medial aspect of the amputation site with severe surrounding soft tissue edema. Bone marrow edema in the plantar medial aspect of the medial cuneiform with cortical destruction consistent with osteomyelitis. Bone marrow edema in the residual bases of the metatarsals which may reflect reactive postsurgical changes versus osteomyelitis. No acute fracture or dislocation. Normal alignment. No joint effusion. Osteoarthritis of the talonavicular joint, calcaneocuboid joint and cuneiform-navicular joint. Moderate osteoarthritis of the tibiotalar joint with high-grade partial-thickness cartilage loss and subchondral reactive marrow edema. Small plantar calcaneal spur. Enthesopathic changes of the Achilles tendon insertion. Ligaments, Muscles and Tendons Flexor extensor and peroneal tendons demonstrate no focal abnormality. Disruption of the  distal aspect of the plantar fascia consistent with postsurgical changes. Along the superficial aspect of the medial band of the plantar fascia at the level of the navicular there is a 2.3 x 3 x 3.4 cm complex  fluid collection with a sinus tract to the wound along the plantar medial aspect consistent with an abscess. Severe tendinosis of the proximal in mid Achilles tendon with a partial tear. Soft tissue Along the superficial aspect of the medial band of the plantar fascia at the level of the navicular there is a 2.3 x 3 x 3.4 cm complex fluid collection with a sinus tract to the wound along the plantar medial aspect consistent with an abscess. Surrounding soft tissue edema likely reflecting cellulitis. IMPRESSION: 1. Prior transmetatarsal amputation through the base of the metatarsals. Soft tissue wound along the plantar medial aspect of the amputation site with severe surrounding soft tissue edema likely reflecting cellulitis. Bone marrow edema in the plantar medial aspect of the medial cuneiform with cortical destruction consistent with osteomyelitis. 2. Along the superficial aspect of the medial band of the plantar fascia at the level of the navicular there is a 2.3 x 3 x 3.4 cm complex fluid collection with a sinus tract to the wound along the plantar medial aspect consistent with an abscess. 3. Bone marrow edema in the residual bases of the metatarsals which may reflect reactive postsurgical changes versus osteomyelitis. 4. Severe tendinosis of the proximal in mid Achilles tendon with a partial tear. Electronically Signed   By: Kathreen Devoid   On: 06/19/2020 11:46   DG Foot Complete Right  Result Date: 06/18/2020 CLINICAL DATA:  86 year old female with infection of the right foot. EXAM: RIGHT FOOT COMPLETE - 3+ VIEW COMPARISON:  Right foot radiograph dated 05/24/2017. FINDINGS: Status post transmetatarsal amputation. There is no acute fracture or dislocation. The bones are osteopenic. Postsurgical changes and soft tissue thickening of the foot over the amputation. No radiopaque foreign object or soft tissue gas. Overlying cast or dressing noted. IMPRESSION: Status post transmetatarsal amputation. No acute  fracture or dislocation. Electronically Signed   By: Anner Crete M.D.   On: 06/18/2020 15:55    ROS:  No recent f/c/n/v/wt loss.  10 system review is o/w negative. PE:  Blood pressure 139/61, pulse 84, temperature 98.1 F (36.7 C), temperature source Oral, resp. rate 18, height 5\' 8"  (1.727 m), weight 75.9 kg, SpO2 96 %. wn wd woman in nad.  A and O x 4.  Mood and affect are normal.  EOMI.  resp unlabored.  R foot has a large plantar ulcer at the distal medial operative site.  5 cm x 5 cm with abundant granulation tissue. Palpation of the plantar foot causes copious purulent drainage from the wound.  No erythema, swelling, lymphangiitis.  5/5 strength in PF an DF of the ankle.  NO lymphadenopathy.  Assessment/Plan: Nonhealing diabetic foot ulcer and osteomyelitis - I explained the nature of the diagnosis to the patient in detail.  She has failed outpatient wound care and oral abx and now has osteomyelitis at the midfoot.  At this point I believe it's medically necessary to consider R BKA for definitive treatment of the ongoing infection and ulcer.  I don't believe limb salvage is possible any longer.  She understands the risks and benefits of the alternative treatment options and elects amputation below the knee.  We will schedule surgery for Tuesday afternoon.  She may bear weight as tolerated.  Dry dressing daily and as needed.  Cam boot for ambulation.  Pre op order will be written Monday.  Wylene Simmer 06/19/2020, 5:24 PM

## 2020-06-19 NOTE — Progress Notes (Signed)
Pharmacy Antibiotic Note  [name] is a 86 y.o. female admitted on 06/18/2020 with cellulitis.  Pharmacy has been consulted for Vancomycin dosing.   Height: 5\' 8"  (172.7 cm) Weight: 75.8 kg (167 lb) IBW/kg (Calculated) : 63.9  Temp (24hrs), Avg:98.3 F (36.8 C), Min:97.8 F (36.6 C), Max:98.8 F (37.1 C)  Recent Labs  Lab 06/18/20 1513  WBC 14.3*  CREATININE 0.68  LATICACIDVEN 1.2    Estimated Creatinine Clearance: 51.9 mL/min (by C-G formula based on SCr of 0.68 mg/dL).    Allergies  Allergen Reactions  . Sulfa Antibiotics Nausea And Vomiting    Severe vomiting.     Antimicrobials this admission: 12/4 ceftriaxone >>  12/4 Vancomycin >>   Dose adjustments this admission: N/a  Microbiology results: Pending   Plan:  - Vancomycin loading dose not indicated for cellulitis  - Start Vancomycin 1250mg  IV q24h - Goal trough 10-15 - Monitor patients renal function and urine output  - De-escalate ABX when appropriate   Thank you for allowing pharmacy to be a part of this patient's care.  Duanne Limerick PharmD. BCPS 06/19/2020 10:12 AM

## 2020-06-19 NOTE — Progress Notes (Signed)
Initial Nutrition Assessment  RD working remotely.  DOCUMENTATION CODES:   Not applicable  INTERVENTION:  - will order Ensure Max once/day, each supplement provides 150 kcal and 30 grams protein.  - will order 30 ml Prosource Plus once/day, each supplement provides 100 kcal and 15 grams protein.  - will order Juven BID, each packet provides 95 calories, 2.5 grams of protein (collagen), and 9.8 grams of carbohydrate (3 grams sugar); also contains 7 grams of L-arginine and L-glutamine, 300 mg vitamin C, 15 mg vitamin E, 1.2 mcg vitamin B-12, 9.5 mg zinc, 200 mg calcium, and 1.5 g  Calcium Beta-hydroxy-Beta-methylbutyrate to support wound healing.  - complete NFPE at follow-up.    NUTRITION DIAGNOSIS:   Increased nutrient needs related to wound healing as evidenced by estimated needs.  GOAL:   Patient will meet greater than or equal to 90% of their needs  MONITOR:   PO intake, Supplement acceptance, Labs, Weight trends  REASON FOR ASSESSMENT:   Consult Assessment of nutrition requirement/status, Wound healing  ASSESSMENT:   86 y.o. female with medical history of DM, PVD requiring R midfoot amputation in 02/2020, CAD, HTN, hypothyroidism, anxiety, GERD, and CHF. She presented to the ED due to R foot infection. She had been on wound vac until 1 week ago and was started on doxy 11/29. Patient reported intermittent shooting-type pain.  No intakes documented since admission. Patient reports good appetite now and PTA with no recent changes. She understands the importance of increased/adequate protein intake for wound healing and has been trying to consume protein with all meals since amputation.   Weight today is 167 lb and weight on 9/3 was 170 lb. This indicates 3 lb weight loss (1.8% body weight) in the past 3 months; not significant for time frame. Weight on 06/23/19 was 181 lb which indicates 14 lb weight loss (7.7% body weight) in the past 1 year; not significant for time frame.    Per notes: - not septic and no evidence of osteomyelitis   Labs reviewed; CBG: 198 mg/dl. Medications reviewed; 100 mg colace BID, sliding scale novolog, 4 units novolog TID, 70 units lantus/day, 40 mg protonix/day. IVF; LR @ 75 ml/hr.     NUTRITION - FOCUSED PHYSICAL EXAM:  unable to complete at this time.   Diet Order:   Diet Order            Diet heart healthy/carb modified Room service appropriate? Yes; Fluid consistency: Thin  Diet effective now                 EDUCATION NEEDS:   Not appropriate for education at this time  Skin:  Skin Assessment: Reviewed RN Assessment  Last BM:  PTA/unknown  Height:   Ht Readings from Last 1 Encounters:  06/19/20 5\' 8"  (1.727 m)    Weight:   Wt Readings from Last 1 Encounters:  06/19/20 75.9 kg     Estimated Nutritional Needs:  Kcal:  1650-1850 kcal Protein:  80-90 grams Fluid:  >/= 1.6 L/day      Jarome Matin, MS, RD, LDN, CNSC Inpatient Clinical Dietitian RD pager # available in [facility]  After hours/weekend pager # available in [facility]

## 2020-06-19 NOTE — ED Provider Notes (Signed)
[facility] EMERGENCY DEPARTMENT Provider Note  CSN: [id number] Arrival date & time: 06/18/20 1433  Chief Complaint(s) Wound Infection  HPI [name] is a 86 y.o. female with a history of diabetes and peripheral vascular disease requiring right midfoot amputation in August of this year, here for right foot infection.  Patient reports that she has been working through wound healing since the surgery and has been doing well up until 1 week ago.  She was on wound VAC VAC until last week, when they noted it was getting infected. Started on Doxy on Monday (6 days ago).  Seen 2 days ago by her wound doctor who marked the borders of the cellulitis and instructed her to keep an eye on it.  She reports that the erythema has expanded beyond the markings.  She does report intermittent shooting pains.  No fevers or chills.  No trauma.  No other physical complaints.  She reports that she has been compliant with her antibiotics. HPI  Past Medical History Past Medical History:  Diagnosis Date  . Anxiety   . CAD (coronary artery disease)   . Cellulitis 10/2015   LEFT FOOT  . CHF (congestive heart failure) ([place])   . Complication of anesthesia   . Coronary artery disease   . Diabetes mellitus without complication (HCC)    insulin dependent  . GERD (gastroesophageal reflux disease)   . Hypertension   . Hypothyroidism   . Neuromuscular disorder (HCC)    muscle cramps to lower extremities  . Other primary cardiomyopathies   . Peripheral vascular disease ([place])   . PONV (postoperative nausea and vomiting)   . Shortness of breath    Patient Active Problem List   Diagnosis Date Noted  . HCAP (healthcare-associated pneumonia)   . Elevated troponin level not due myocardial infarction 03/13/2020  . GERD without esophagitis 03/13/2020  . Anemia 03/13/2020  . Thyroid goiter 03/13/2020  . Acute respiratory failure with hypoxia ([place]) 03/13/2020  . Type 2 diabetes mellitus with  complication, without long-term current use of insulin ([place]) 03/13/2020  . Elevated liver enzymes 03/13/2020  . Acute on chronic congestive heart failure ([place]) 03/13/2020  . Acute cardiogenic pulmonary edema (HCC) 03/12/2020  . Diabetic ulcer of right foot ([place]) 03/12/2020  . Metatarsalgia of left foot 03/14/2019  . Amputated toe ([place]) 12/06/2018  . Achilles tendon contracture, left 07/24/2016  . Ulcer of left foot, limited to breakdown of skin ([place]) 06/01/2016  . Cellulitis 10/27/2015  . Severe peripheral arterial disease ([place]) 12/28/2014  . Chronic diastolic heart failure ([place]) 12/30/2013  . Edema 12/30/2013  . Mixed hyperlipidemia 06/24/2013  . Mitral valve disorders(424.0) 06/24/2013  . Coronary artery disease involving native coronary artery of native heart   . Essential hypertension   . Diabetes mellitus without complication ([place])   . Other primary cardiomyopathies    Home Medication(s) Prior to Admission medications   Medication Sig Start Date End Date Taking? Authorizing Provider  acetaminophen (TYLENOL) 325 MG tablet Take 2 tablets (650 mg total) by mouth every 4 (four) hours as needed for headache or mild pain. 03/19/20  Yes Debbe Odea, MD  aspirin EC 81 MG EC tablet Take 1 tablet (81 mg total) by mouth daily. Swallow whole. Patient taking differently: Take 325 mg by mouth at bedtime. Swallow whole. 03/20/20  Yes Debbe Odea, MD  atorvastatin (LIPITOR) 20 MG tablet Take 1 tablet (20 mg total) by mouth daily. Please keep upcoming appt in July with Dr. Irish Lack before  anymore refills. Thank you Patient taking differently: Take 20 mg by mouth every evening.  01/05/20  Yes [name], MD  b complex vitamins capsule Take 1 capsule by mouth daily.   Yes [provider]  Biotin w/ Vitamins C & E (HAIR SKIN & NAILS GUMMIES PO) Take 2 tablets by mouth daily. gummy   Yes [provider]  Calcium-Magnesium-Zinc (CAL-MAG-ZINC PO) Take 1 tablet by mouth daily.    Yes [provider]  carvedilol (COREG) 25 MG tablet Take 1 tablet (25 mg total) by mouth 2 (two) times daily with a meal. *Please call and schedule an appointment with Dr Fletcher Anon* Patient taking differently: Take 25 mg by mouth at bedtime.  07/18/16  Yes Wellington Hampshire, MD  cholecalciferol (VITAMIN D) 1000 units tablet Take 2,000 Units by mouth daily.    Yes [provider]  clopidogrel (PLAVIX) 75 MG tablet TAKE 1 TABLET BY MOUTH ONCE DAILY Patient taking differently: Take 75 mg by mouth at bedtime.  05/12/20  Yes Serafina Mitchell, MD  Coenzyme Q10 (CO Q 10) 100 MG CAPS Take 300 mg by mouth daily.    Yes [provider]  doxycycline (VIBRA-TABS) 100 MG tablet 100 mg 2 (two) times daily.    Yes [provider]  ergocalciferol (VITAMIN D2) 1.25 MG (50000 UT) capsule Take 50,000 Units by mouth every Friday.    Yes [provider]  furosemide (LASIX) 40 MG tablet Take 1 tablet (40 mg total) by mouth daily. 05/03/20  Yes [name], MD  insulin aspart (NOVOLOG) 100 UNIT/ML injection Inject 10 Units into the skin 3 (three) times daily with meals. 03/19/20  Yes Debbe Odea, MD  insulin glargine (LANTUS) 100 UNIT/ML injection Inject 0.35 mLs (35 Units total) into the skin every evening. Patient taking differently: Inject 70 Units into the skin at bedtime.  03/19/20  Yes Debbe Odea, MD  insulin lispro (HUMALOG) 100 UNIT/ML injection Inject 10-20 Units into the skin See admin instructions. Take 10 units at breakfast, 20 units at lunch and dinner   Yes [provider]  iron polysaccharides (NIFEREX) 150 MG capsule Take 150 mg by mouth daily.    Yes [provider]  lisinopril (PRINIVIL,ZESTRIL) 20 MG tablet Take 20 mg by mouth every evening.  10/29/15  Yes [provider]  nitroGLYCERIN (NITROSTAT) 0.4 MG SL tablet PLACE 1 TABLET UNDER THE TONGUE EVERY 5 MINUTES AS NEEDED FOR CHEST PAIN Patient taking differently: Place 0.4 mg  under the tongue every 5 (five) minutes as needed for chest pain. PLACE 1 TABLET UNDER THE TONGUE EVERY 5 MINUTES AS NEEDED FOR CHEST PAIN 09/19/19  Yes [name], MD  pantoprazole (PROTONIX) 40 MG tablet TAKE 1 TABLET BY MOUTH ONCE DAILY AT 6 AM Patient taking differently: Take 40 mg by mouth daily.  03/30/20  Yes [name], MD  PARoxetine (PAXIL) 40 MG tablet Take 40 mg by mouth at bedtime.    Yes [provider]  polyethylene glycol (MIRALAX / GLYCOLAX) 17 g packet Take 17 g by mouth daily as needed for mild constipation. 03/19/20  Yes Debbe Odea, MD  potassium chloride SA (KLOR-CON) 20 MEQ tablet Take 1 tablet (20 mEq total) by mouth daily. 05/03/20  Yes [name], MD  collagenase (SANTYL) ointment Santyl 250 unit/gram topical ointment Patient not taking: Reported on 06/19/2020    [provider]  gabapentin (NEURONTIN) 300 MG capsule Take 1 capsule (300 mg total) by mouth  2 (two) times daily. Patient not taking: Reported on 06/19/2020 03/19/20   Debbe Odea, MD                                                                                                                                    Past Surgical History Past Surgical History:  Procedure Laterality Date  . ABDOMINAL AORTOGRAM N/A 09/03/2018   Procedure: ABDOMINAL AORTOGRAM;  Surgeon: Serafina Mitchell, MD;  Location: [place] [facility];  Service: Cardiovascular;  Laterality: N/A;  . ABDOMINAL AORTOGRAM W/LOWER EXTREMITY N/A 04/16/2018   Procedure: ABDOMINAL AORTOGRAM W/LOWER EXTREMITY;  Surgeon: Serafina Mitchell, MD;  Location: [place] [facility];  Service: Cardiovascular;  Laterality: N/A;  unilateral  . ABDOMINAL AORTOGRAM W/LOWER EXTREMITY Bilateral 04/08/2020   Procedure: ABDOMINAL AORTOGRAM W/LOWER EXTREMITY;  Surgeon: Serafina Mitchell, MD;  Location: [place] [facility];  Service: Cardiovascular;  Laterality: Bilateral;  . ABDOMINAL HYSTERECTOMY    . AMPUTATION Left 12/03/2018    Procedure: Left 3rd ray amputation;  Surgeon: Wylene Simmer, MD;  Location: [place];  Service: Orthopedics;  Laterality: Left;  56min  . CHOLECYSTECTOMY    . CORONARY ANGIOPLASTY WITH STENT PLACEMENT  08/09/2011   DES  to mid circumflex  . I & D EXTREMITY Left 10/29/2015   Procedure: Irrigation and Debridement Left Foot;  Surgeon: Newt Minion, MD;  Location: [place];  Service: Orthopedics;  Laterality: Left;  . LEFT HEART CATHETERIZATION WITH CORONARY ANGIOGRAM N/A 08/08/2012   Procedure: LEFT HEART CATHETERIZATION WITH CORONARY ANGIOGRAM;  Surgeon: [name], MD;  Location: [facility] CATH LAB;  Service: Cardiovascular;  Laterality: N/A;  . PERIPHERAL VASCULAR ATHERECTOMY  04/16/2018   Procedure: PERIPHERAL VASCULAR ATHERECTOMY;  Surgeon: Serafina Mitchell, MD;  Location: [place] [facility];  Service: Cardiovascular;;  lt. Peroneal  . PERIPHERAL VASCULAR ATHERECTOMY Right 04/08/2020   Procedure: PERIPHERAL VASCULAR ATHERECTOMY;  Surgeon: Serafina Mitchell, MD;  Location: [place] [facility];  Service: Cardiovascular;  Laterality: Right;  SFA and Peroneal  . PERIPHERAL VASCULAR BALLOON ANGIOPLASTY  04/16/2018   Procedure: PERIPHERAL VASCULAR BALLOON ANGIOPLASTY;  Surgeon: Serafina Mitchell, MD;  Location: [place] [facility];  Service: Cardiovascular;;  lt. sfa and AT  . PERIPHERAL VASCULAR BALLOON ANGIOPLASTY Left 09/03/2018   Procedure: PERIPHERAL VASCULAR BALLOON ANGIOPLASTY;  Surgeon: Serafina Mitchell, MD;  Location: [place] [facility];  Service: Cardiovascular;  Laterality: Left;  TP TRUNK  . PERIPHERAL VASCULAR BALLOON ANGIOPLASTY Right 04/08/2020   Procedure: PERIPHERAL VASCULAR BALLOON ANGIOPLASTY;  Surgeon: Serafina Mitchell, MD;  Location: [place] [facility];  Service: Cardiovascular;  Laterality: Right;  SFA (DCB), Peroneal  . PERIPHERAL VASCULAR CATHETERIZATION N/A 12/30/2014   Procedure: Lower Extremity Angiography;  Surgeon: Wellington Hampshire, MD;  Location: [place] [facility];   Service: Cardiovascular;  Laterality: N/A;  . PERIPHERAL VASCULAR INTERVENTION Left 09/03/2018   Procedure: PERIPHERAL VASCULAR INTERVENTION;  Surgeon: Serafina Mitchell, MD;  Location: [place] [facility];  Service: Cardiovascular;  Laterality: Left;  SFA STENT   . TENDON RELEASE Right 03/11/2020   Procedure: Heel Cord Lengthening;  Surgeon: Wylene Simmer, MD;  Location: [place];  Service: Orthopedics;  Laterality: Right;  . THYROID SURGERY     radioactive iodine   . TONSILLECTOMY    . TRANSMETATARSAL AMPUTATION Right 03/11/2020   Procedure: Right foot transmetatarsal amputation;  Surgeon: Wylene Simmer, MD;  Location: [place];  Service: Orthopedics;  Laterality: Right;   Family History Family History  Problem Relation Age of Onset  . Heart disease Father   . Heart attack Father   . Diabetes Sister   . Heart disease Son        before age 53  . Heart attack 13        84yr old  . Sudden death Grandchild   . Hypertension Neg Hx     Social History Social History   Tobacco Use  . Smoking status: Former Smoker    Quit date: 11/28/1960    Years since quitting: 59.5  . Smokeless tobacco: Never Used  Vaping Use  . Vaping Use: Never used  Substance Use Topics  . Alcohol use: Yes    Alcohol/week: 0.0 standard drinks    Comment: rare  . Drug use: No   Allergies Sulfa antibiotics  Review of Systems Review of Systems All other systems are reviewed and are negative for acute change except as noted in the HPI  Physical Exam Vital Signs  I have reviewed the triage vital signs BP (!) 159/63 (BP Location: Right Arm)   Pulse 82   Temp 98.1 F (36.7 C) (Oral)   Resp 17   Ht 5\' 8"  (1.727 m)   Wt 75.8 kg   SpO2 98%   BMI 25.39 kg/m   Physical Exam Vitals reviewed.  Constitutional:      General: She is not in acute distress.    Appearance: She is well-developed. She is not diaphoretic.  HENT:     Head: Normocephalic and atraumatic.      Right Ear: External ear normal.     Left Ear: External ear normal.     Nose: Nose normal.  Eyes:     General: No scleral icterus.    Conjunctiva/sclera: Conjunctivae normal.  Neck:     Trachea: Phonation normal.  Cardiovascular:     Rate and Rhythm: Normal rate and regular rhythm.  Pulmonary:     Effort: Pulmonary effort is normal. No respiratory distress.     Breath sounds: No stridor.  Abdominal:     General: There is no distension.  Musculoskeletal:        General: Normal range of motion.     Cervical back: Normal range of motion.  Neurological:     Mental Status: She is alert and oriented to person, place, and time.  Psychiatric:        Behavior: Behavior normal.       ED Results and Treatments Labs (all labs ordered are listed, but only abnormal results are displayed) Labs Reviewed  COMPREHENSIVE METABOLIC PANEL - Abnormal; Notable for the following components:      Result Value   Albumin 3.1 (*)    All other components within normal limits  CBC WITH DIFFERENTIAL/PLATELET - Abnormal; Notable for the following components:   WBC 14.3 (*)    RBC 3.66 (*)    Hemoglobin 9.8 (*)  HCT 31.3 (*)    Platelets 443 (*)    Neutro Abs 10.2 (*)    Monocytes Absolute 1.3 (*)    All other components within normal limits  RESP PANEL BY RT-PCR (FLU A&B, COVID) ARPGX2  LACTIC ACID, PLASMA  URINALYSIS, ROUTINE W REFLEX MICROSCOPIC                                                                                                                         EKG  EKG Interpretation  Date/Time:    Ventricular Rate:    PR Interval:    QRS Duration:   QT Interval:    QTC Calculation:   R Axis:     Text Interpretation:        Radiology DG Foot Complete Right  Result Date: 06/18/2020 CLINICAL DATA:  86 year old female with infection of the right foot. EXAM: RIGHT FOOT COMPLETE - 3+ VIEW COMPARISON:  Right foot radiograph dated 05/24/2017. FINDINGS: Status post transmetatarsal  amputation. There is no acute fracture or dislocation. The bones are osteopenic. Postsurgical changes and soft tissue thickening of the foot over the amputation. No radiopaque foreign object or soft tissue gas. Overlying cast or dressing noted. IMPRESSION: Status post transmetatarsal amputation. No acute fracture or dislocation. Electronically Signed   By: Anner Crete M.D.   On: 06/18/2020 15:55    Pertinent labs & imaging results that were available during my care of the patient were reviewed by me and considered in my medical decision making (see chart for details).  Medications Ordered in ED Medications  piperacillin-tazobactam (ZOSYN) IVPB 3.375 g (has no administration in time range)                                                                                                                                    Procedures Procedures  (including critical care time)  Medical Decision Making / ED Course I have reviewed the nursing notes for this encounter and the patient's prior records (if available in EHR or on provided paperwork).   [name] was evaluated in Emergency Department on 06/19/2020 for the symptoms described in the history of present illness. She was evaluated in the context of the global COVID-19 pandemic, which necessitated consideration that the patient might be at risk for infection with the SARS-CoV-2 virus that causes COVID-19. Institutional protocols and algorithms that pertain to the evaluation of patients at risk  for COVID-19 are in a state of rapid change based on information released by regulatory bodies including the CDC and federal and state organizations. These policies and algorithms were followed during the patient's care in the ED.  Right foot infection.  Failed outpatient management. Labs notable for leukocytosis.  Patient is however not septic. Plain film without evidence of osteomyelitis.  We will admit for IV antibiotics.      Final Clinical  Impression(s) / ED Diagnoses Final diagnoses:  Wound infection      This chart was dictated using voice recognition software.  Despite best efforts to proofread,  errors can occur which can change the documentation meaning.   Fatima Blank, MD 06/19/20 [phone]

## 2020-06-20 DIAGNOSIS — E782 Mixed hyperlipidemia: Secondary | ICD-10-CM

## 2020-06-20 DIAGNOSIS — E1151 Type 2 diabetes mellitus with diabetic peripheral angiopathy without gangrene: Secondary | ICD-10-CM

## 2020-06-20 DIAGNOSIS — I10 Essential (primary) hypertension: Secondary | ICD-10-CM

## 2020-06-20 DIAGNOSIS — I5032 Chronic diastolic (congestive) heart failure: Secondary | ICD-10-CM

## 2020-06-20 DIAGNOSIS — I25119 Atherosclerotic heart disease of native coronary artery with unspecified angina pectoris: Secondary | ICD-10-CM

## 2020-06-20 LAB — CBC
HCT: 26.6 % — ABNORMAL LOW (ref 36.0–46.0)
Hemoglobin: 8.4 g/dL — ABNORMAL LOW (ref 12.0–15.0)
MCH: 26.8 pg (ref 26.0–34.0)
MCHC: 31.6 g/dL (ref 30.0–36.0)
MCV: 84.7 fL (ref 80.0–100.0)
Platelets: 405 10*3/uL — ABNORMAL HIGH (ref 150–400)
RBC: 3.14 MIL/uL — ABNORMAL LOW (ref 3.87–5.11)
RDW: 15.3 % (ref 11.5–15.5)
WBC: 9 10*3/uL (ref 4.0–10.5)
nRBC: 0 % (ref 0.0–0.2)

## 2020-06-20 LAB — BASIC METABOLIC PANEL
Anion gap: 9 (ref 5–15)
BUN: 24 mg/dL — ABNORMAL HIGH (ref 8–23)
CO2: 25 mmol/L (ref 22–32)
Calcium: 8.8 mg/dL — ABNORMAL LOW (ref 8.9–10.3)
Chloride: 104 mmol/L (ref 98–111)
Creatinine, Ser: 0.76 mg/dL (ref 0.44–1.00)
GFR, Estimated: >60 mL/min (ref >60)
Glucose, Bld: 189 mg/dL — ABNORMAL HIGH (ref 70–99)
Potassium: 3.9 mmol/L (ref 3.5–5.1)
Sodium: 138 mmol/L (ref 135–145)

## 2020-06-20 LAB — GLUCOSE, CAPILLARY
Glucose-Capillary: 167 mg/dL — ABNORMAL HIGH (ref 70–99)
Glucose-Capillary: 125 mg/dL — ABNORMAL HIGH (ref 70–99)
Glucose-Capillary: 152 mg/dL — ABNORMAL HIGH (ref 70–99)
Glucose-Capillary: 165 mg/dL — ABNORMAL HIGH (ref 70–99)

## 2020-06-20 MED ORDER — LISINOPRIL 10 MG PO TABS
10.0000 mg | ORAL_TABLET | Freq: Every evening | ORAL | Status: DC
  Administered 2020-06-20 – 2020-06-24 (×4): 10 mg via ORAL
  Filled 2020-06-20 (×4): qty 1

## 2020-06-20 MED ORDER — VANCOMYCIN HCL 750 MG/150ML IV SOLN
750.0000 mg | Freq: Two times a day (BID) | INTRAVENOUS | Status: DC
  Administered 2020-06-20 – 2020-06-22 (×6): 750 mg via INTRAVENOUS
  Filled 2020-06-20 (×7): qty 150

## 2020-06-20 NOTE — Plan of Care (Signed)
Dressing changed completed on R foot. Wound had purulent drainage, some redness, wound cleaned. Kerlix, 4x4 gauze and tape applied. Pt tolerated it well. Will continue to monitor.    Problem: Education: Goal: Knowledge of General Education information will improve Description: Including pain rating scale, medication(s)/side effects and non-pharmacologic comfort measures Outcome: Progressing   Problem: Activity: Goal: Risk for activity intolerance will decrease Outcome: Progressing   Problem: Pain Managment: Goal: General experience of comfort will improve Outcome: Progressing   Problem: Safety: Goal: Ability to remain free from injury will improve Outcome: Progressing   Problem: Skin Integrity: Goal: Risk for impaired skin integrity will decrease Outcome: Progressing

## 2020-06-20 NOTE — Progress Notes (Signed)
PROGRESS NOTE    [name]  QAS:[id number] DOB: July 23, 1931 DOA: 06/18/2020 PCP: Marton Redwood, MD    Brief Narrative:  [name] was admitted to the hospital with working diagnosis of right foot infection, osteomyelitis and abscess.   86 year old female past medical history for peripheral vascular disease, hyper thyroidism, hypertension, type 2 diabetes mellitus, coronary artery disease, diastolic heart failure and diabetic foot status post right transmetatarsal amputation 8/26.  As an outpatient right foot infection treated with wound VAC and multiple course of oral antibiotics, unfortunately progressive infection.  On her initial physical examination blood pressure 149/59, heart rate 80, respiratory rate 18, temperature 98.4, oxygen saturation 98%, lungs clear to auscultation bilaterally, heart S1-S2, present rhythmic, soft abdomen, right lower extremity, transmetatarsal amputation, ulceration along the plantar surface with purulent drainage, positive erythema and edema. Sodium 137, potassium 4.2, chloride 100, bicarb 25, glucose 76, BUN 15, creatinine 0.68, lactic acid 1.2, white count 14.3, hemoglobin 9.8, hematocrit 31.3, platelets 443.  SARS COVID-19 negative.  Urinalysis negative for infection.  Right foot x-ray with transmetatarsal amputation no acute fracture or dislocation.  Right foot MRI with soft tissue wound along the plantar medial aspect of the amputation site with severe surrounding soft tissue edema likely reflecting cellulitis.  Bone marrow edema in the plantar medial aspect of the medial cuneiform with cortical destruction consistent with osteomyelitis.  +2.3 x 3 x 3.4 cm complex fluid collection with sinus tract to the wound at the plantar fascia of the navicular bone.  Consistent with abscess.  Patient has been placed on broad-spectrum IV antibiotic therapy and orthopedics has been consulted. Plan for surgical intervention 12/7.  Assessment & Plan:   Principal  Problem:   Diabetic infection of right foot ([place]) Active Problems:   Coronary artery disease involving native coronary artery of native heart   Essential hypertension   Diabetes mellitus with peripheral vascular disease ([place])   Mixed hyperlipidemia   Chronic diastolic heart failure ([place])   1. Right diabetic foot infection, with cellulitis, osteomyelitis and abscess. Pain is controlled, wbc is 9.0 and cultures no growth. Wound culture from 2019 positive for MRSA.   Continue with current antibiotic regimen with ceftriaxone, vancomycin and metronidazole.  Continue to follow up on cell count and temperature curve. Plan for surgical intervention on 12/07.   2. T2DM/ dyslipidemia. Continue glucose control with basal insulin 40 units daily, pre-meal insulin aspart 4 units and insulin sliding scale. Fasting glucose this am 189, capillary 170, 167, 125.  Continue with atorvastatin.   3. HTN. Continue blood pressure control with lisinopril 10 mg daily (blood pressure systolic 798 to 921 mmHg) and carvedilol 25 mg bid.   4. Chronic diastolic heart failure No clinical signs of exacerbation, continue close monitoring of blood pressure   5. Depression. Continue with paroxetine.   Patient continue to be at high risk for worsening foot infection   Status is: Inpatient  Remains inpatient appropriate because:IV treatments appropriate due to intensity of illness or inability to take PO   Dispo: The patient is from: Home              Anticipated d/c is to: SNF              Anticipated d/c date is: > 3 days              Patient currently is not medically stable to d/c.   DVT prophylaxis: Enoxaparin   Code Status:   full  Family Communication:  No  family at the bedside      Nutrition Status: Nutrition Problem: Increased nutrient needs Etiology: wound healing Signs/Symptoms: estimated needs Interventions: Prostat, Other (Comment) (Ensure Max)     Skin Documentation:     Consultants:    orthopedics    Antimicrobials:   Vancomycin, ceftriaxone and metronidazole.     Subjective: Patient with pain controlled, no nausea or vomiting, no dyspnea or chest pain   Objective: Vitals:   06/19/20 2012 06/20/20 0403 06/20/20 0905 06/20/20 1347  BP: (!) 129/56 (!) 131/58 (!) 151/61 (!) 113/55  Pulse: 78 69 74 65  Resp: 17 18 20 18   Temp: 98 F (36.7 C) 97.8 F (36.6 C) 98.1 F (36.7 C) 98.2 F (36.8 C)  TempSrc: Oral Oral Oral Oral  SpO2: 97% 97% 98% 96%  Weight:      Height:        Intake/Output Summary (Last 24 hours) at 06/20/2020 1506 Last data filed at 06/20/2020 0900 Gross per 24 hour  Intake 480 ml  Output --  Net 480 ml   Filed Weights   06/18/20 1503 06/19/20 1149  Weight: 75.8 kg 75.9 kg    Examination:   General: Not in pain or dyspnea, deconditioned  Neurology: Awake and alert, non focal  E ENT: no pallor, no icterus, oral mucosa moist Cardiovascular: No JVD. S1-S2 present, rhythmic, no gallops, rubs, or murmurs. No lower extremity edema. Pulmonary: positive breath sounds bilaterally, adequate air movement, no wheezing, rhonchi or rales. Gastrointestinal. Abdomen soft and non tender Skin. No rashes Musculoskeletal: right foot with dressing in place.      Data Reviewed: I have personally reviewed following labs and imaging studies  CBC: Recent Labs  Lab 06/18/20 1513 06/20/20 0227  WBC 14.3* 9.0  NEUTROABS 10.2*  --   HGB 9.8* 8.4*  HCT 31.3* 26.6*  MCV 85.5 84.7  PLT 443* 902*   Basic Metabolic Panel: Recent Labs  Lab 06/18/20 1513 06/20/20 0227  NA 137 138  K 4.2 3.9  CL 100 104  CO2 25 25  GLUCOSE 76 189*  BUN 15 24*  CREATININE 0.68 0.76  CALCIUM 9.3 8.8*   GFR: Estimated Creatinine Clearance: 51.9 mL/min (by C-G formula based on SCr of 0.76 mg/dL). Liver Function Tests: Recent Labs  Lab 06/18/20 1513  AST 19  ALT 15  ALKPHOS 66  BILITOT 0.4  PROT 6.8  ALBUMIN 3.1*   No results for input(s): LIPASE,  AMYLASE in the last 168 hours. No results for input(s): AMMONIA in the last 168 hours. Coagulation Profile: No results for input(s): INR, PROTIME in the last 168 hours. Cardiac Enzymes: No results for input(s): CKTOTAL, CKMB, CKMBINDEX, TROPONINI in the last 168 hours. BNP (last 3 results) No results for input(s): PROBNP in the last 8760 hours. HbA1C: No results for input(s): HGBA1C in the last 72 hours. CBG: Recent Labs  Lab 06/19/20 1211 06/19/20 1651 06/19/20 2111 06/20/20 0653 06/20/20 1203  GLUCAP 198* 170* 176* 167* 125*   Lipid Profile: No results for input(s): CHOL, HDL, LDLCALC, TRIG, CHOLHDL, LDLDIRECT in the last 72 hours. Thyroid Function Tests: No results for input(s): TSH, T4TOTAL, FREET4, T3FREE, THYROIDAB in the last 72 hours. Anemia Panel: No results for input(s): VITAMINB12, FOLATE, FERRITIN, TIBC, IRON, RETICCTPCT in the last 72 hours.    Radiology Studies: I have reviewed all of the imaging during this hospital visit personally     Scheduled Meds: . (feeding supplement) PROSource Plus  30 mL Oral Daily  . atorvastatin  20 mg Oral QPM  . carvedilol  25 mg Oral BID WC  . docusate sodium  100 mg Oral BID  . enoxaparin (LOVENOX) injection  40 mg Subcutaneous Q24H  . insulin aspart  0-15 Units Subcutaneous TID WC  . insulin aspart  0-5 Units Subcutaneous QHS  . insulin aspart  4 Units Subcutaneous TID WC  . insulin glargine  40 Units Subcutaneous QHS  . lisinopril  20 mg Oral QPM  . metroNIDAZOLE  500 mg Oral Q8H  . nutrition supplement (JUVEN)  1 packet Oral BID BM  . pantoprazole  40 mg Oral Daily  . PARoxetine  40 mg Oral QHS  . Ensure Max Protein  11 oz Oral Daily  . sodium chloride flush  3 mL Intravenous Q12H   Continuous Infusions: . cefTRIAXone (ROCEPHIN)  IV 2 g (06/20/20 1419)  . lactated ringers 75 mL/hr at 06/20/20 0401  . vancomycin 750 mg (06/20/20 1226)     LOS: 1 day        Ahlijah Raia Gerome Apley, MD

## 2020-06-20 NOTE — Progress Notes (Signed)
Pharmacy Antibiotic Note  [name] is a 86 y.o. female admitted on 06/18/2020 with concern of diabetic foot infection. Patient is s/p R TMA on 8/26 and recently received doxycycline as an outpatient with no improvement for concern of cellulitis. MRI foot findings consistent with osteomyelitis. Pharmacy has been consulted for Vancomycin dosing. Plan for BKA on 12/7.   Patient originally started on vancomycin 1250mg  IV q24h for cellulitis dosing. Patient also receiving ceftriaxone and metronidazole. Wbc 9.0 Afebrile. Scr stable. UOP not accurately recorded.    Height: 5\' 8"  (172.7 cm) Weight: 75.9 kg (167 lb 5.3 oz) IBW/kg (Calculated) : 63.9  Temp (24hrs), Avg:98 F (36.7 C), Min:97.8 F (36.6 C), Max:98.4 F (36.9 C)  Recent Labs  Lab 06/18/20 1513 06/20/20 0227  WBC 14.3* 9.0  CREATININE 0.68 0.76  LATICACIDVEN 1.2  --     Estimated Creatinine Clearance: 51.9 mL/min (by C-G formula based on SCr of 0.76 mg/dL).    Allergies  Allergen Reactions  . Sulfa Antibiotics Nausea And Vomiting    Severe vomiting.     Antimicrobials this admission: 12/4 ceftriaxone >>  12/4 Vancomycin >>   Dose adjustments this admission: N/a  Microbiology results: 12/4 bcx: ngtd   Plan:  Adjust vancomycin to 750mg  q12h for osteomyelitis dosing with goal trough 15-20  Monitor renal function, cultures, clinical progression and LOT of antibiotics  Thank you for allowing pharmacy to be a part of this patient's care.   Cristela Felt, PharmD Clinical Pharmacist  06/20/2020 8:36 AM

## 2020-06-21 ENCOUNTER — Other Ambulatory Visit: Payer: Self-pay

## 2020-06-21 ENCOUNTER — Other Ambulatory Visit (HOSPITAL_COMMUNITY): Payer: Self-pay | Admitting: Orthopedic Surgery

## 2020-06-21 LAB — GLUCOSE, CAPILLARY
Glucose-Capillary: 155 mg/dL — ABNORMAL HIGH (ref 70–99)
Glucose-Capillary: 122 mg/dL — ABNORMAL HIGH (ref 70–99)
Glucose-Capillary: 108 mg/dL — ABNORMAL HIGH (ref 70–99)
Glucose-Capillary: 125 mg/dL — ABNORMAL HIGH (ref 70–99)

## 2020-06-21 LAB — CBC WITH DIFFERENTIAL/PLATELET
Abs Immature Granulocytes: 0.03 10*3/uL (ref 0.00–0.07)
Basophils Absolute: 0.1 10*3/uL (ref 0.0–0.1)
Basophils Relative: 1 %
Eosinophils Absolute: 0.5 10*3/uL (ref 0.0–0.5)
Eosinophils Relative: 6 %
HCT: 28.1 % — ABNORMAL LOW (ref 36.0–46.0)
Hemoglobin: 9 g/dL — ABNORMAL LOW (ref 12.0–15.0)
Immature Granulocytes: 0 %
Lymphocytes Relative: 22 %
Lymphs Abs: 1.8 10*3/uL (ref 0.7–4.0)
MCH: 27.1 pg (ref 26.0–34.0)
MCHC: 32 g/dL (ref 30.0–36.0)
MCV: 84.6 fL (ref 80.0–100.0)
Monocytes Absolute: 0.8 10*3/uL (ref 0.1–1.0)
Monocytes Relative: 10 %
Neutro Abs: 5 10*3/uL (ref 1.7–7.7)
Neutrophils Relative %: 61 %
Platelets: 420 10*3/uL — ABNORMAL HIGH (ref 150–400)
RBC: 3.32 MIL/uL — ABNORMAL LOW (ref 3.87–5.11)
RDW: 15.2 % (ref 11.5–15.5)
WBC: 8.2 10*3/uL (ref 4.0–10.5)
nRBC: 0 % (ref 0.0–0.2)

## 2020-06-21 LAB — BASIC METABOLIC PANEL
Anion gap: 9 (ref 5–15)
BUN: 23 mg/dL (ref 8–23)
CO2: 25 mmol/L (ref 22–32)
Calcium: 9.1 mg/dL (ref 8.9–10.3)
Chloride: 105 mmol/L (ref 98–111)
Creatinine, Ser: 0.63 mg/dL (ref 0.44–1.00)
GFR, Estimated: >60 mL/min (ref >60)
Glucose, Bld: 169 mg/dL — ABNORMAL HIGH (ref 70–99)
Potassium: 4 mmol/L (ref 3.5–5.1)
Sodium: 139 mmol/L (ref 135–145)

## 2020-06-21 MED ORDER — ADULT MULTIVITAMIN W/MINERALS CH
1.0000 | ORAL_TABLET | Freq: Every day | ORAL | Status: DC
  Administered 2020-06-21 – 2020-06-28 (×8): 1 via ORAL
  Filled 2020-06-21 (×8): qty 1

## 2020-06-21 MED ORDER — INSULIN GLARGINE 100 UNIT/ML ~~LOC~~ SOLN
20.0000 [IU] | Freq: Every day | SUBCUTANEOUS | Status: DC
  Administered 2020-06-21 – 2020-06-22 (×2): 20 [IU] via SUBCUTANEOUS
  Filled 2020-06-21 (×3): qty 0.2

## 2020-06-21 MED ORDER — SACCHAROMYCES BOULARDII 250 MG PO CAPS
250.0000 mg | ORAL_CAPSULE | Freq: Two times a day (BID) | ORAL | Status: DC
  Administered 2020-06-21 – 2020-06-28 (×16): 250 mg via ORAL
  Filled 2020-06-21 (×16): qty 1

## 2020-06-21 MED ORDER — CEFAZOLIN SODIUM-DEXTROSE 2-4 GM/100ML-% IV SOLN
2.0000 g | INTRAVENOUS | Status: AC
  Administered 2020-06-22: 2 g via INTRAVENOUS
  Filled 2020-06-21: qty 100

## 2020-06-21 MED ORDER — PROSOURCE PLUS PO LIQD
30.0000 mL | Freq: Two times a day (BID) | ORAL | Status: DC
  Administered 2020-06-23 – 2020-06-28 (×11): 30 mL via ORAL
  Filled 2020-06-21 (×11): qty 30

## 2020-06-21 MED ORDER — SODIUM CHLORIDE 0.9 % IV SOLN: INTRAVENOUS | Status: DC

## 2020-06-21 MED ORDER — LOPERAMIDE HCL 2 MG PO CAPS: 2.0000 mg | ORAL_CAPSULE | Freq: Four times a day (QID) | ORAL | Status: DC | PRN

## 2020-06-21 MED ORDER — BISMUTH SUBSALICYLATE 262 MG/15ML PO SUSP
30.0000 mL | Freq: Three times a day (TID) | ORAL | Status: DC
  Administered 2020-06-21 – 2020-06-28 (×19): 30 mL via ORAL
  Filled 2020-06-21 (×3): qty 236

## 2020-06-21 NOTE — Progress Notes (Signed)
PROGRESS NOTE    [name]  YIR:[id number] DOB: 02/06/1932 DOA: 06/18/2020 PCP: Marton Redwood, MD    Brief Narrative:  Mrs. [name] was admitted to the hospital with working diagnosis of right foot infection, osteomyelitis and abscess.   86 year old female past medical history for peripheral vascular disease, hyper thyroidism, hypertension, type 2 diabetes mellitus, coronary artery disease, diastolic heart failure and diabetic foot status post right transmetatarsal amputation 8/26.  As an outpatient right foot infection treated with wound VAC and multiple course of oral antibiotics, unfortunately progressive infection.  On her initial physical examination blood pressure 149/59, heart rate 80, respiratory rate 18, temperature 98.4, oxygen saturation 98%, lungs clear to auscultation bilaterally, heart S1-S2, present rhythmic, soft abdomen, right lower extremity, transmetatarsal amputation, ulceration along the plantar surface with purulent drainage, positive erythema and edema. Sodium 137, potassium 4.2, chloride 100, bicarb 25, glucose 76, BUN 15, creatinine 0.68, lactic acid 1.2, white count 14.3, hemoglobin 9.8, hematocrit 31.3, platelets 443.  SARS COVID-19 negative.  Urinalysis negative for infection.  Right foot x-ray with transmetatarsal amputation no acute fracture or dislocation.  Right foot MRI with soft tissue wound along the plantar medial aspect of the amputation site with severe surrounding soft tissue edema likely reflecting cellulitis.  Bone marrow edema in the plantar medial aspect of the medial cuneiform with cortical destruction consistent with osteomyelitis.  +2.3 x 3 x 3.4 cm complex fluid collection with sinus tract to the wound at the plantar fascia of the navicular bone.  Consistent with abscess.  Patient has been placed on broad-spectrum IV antibiotic therapy and orthopedics has been consulted. Plan for surgical intervention 12/7.   Assessment & Plan:   Principal  Problem:   Diabetic infection of right foot ([place]) Active Problems:   Coronary artery disease involving native coronary artery of native heart   Essential hypertension   Diabetes mellitus with peripheral vascular disease ([place])   Mixed hyperlipidemia   Chronic diastolic heart failure ([place])   1. Right diabetic foot infection, with cellulitis, osteomyelitis and abscess (present on admission). Pain continue to be well controlled, wbc is 8,2 and her cultures continue with no growth. Wound culture from 2019 positive for MRSA.   On broad spectrum antibiotic therapy with: ceftriaxone, vancomycin and metronidazole.  Pending surgical intervention in am.  Patient complains of loose stools, will add probiotics and continue with as needed peptobismol and loperamide. Discontinue colace.   Consult nutrition for recommendations.   2. T2DM/ dyslipidemia. Fating glucose is 169, capillary 165, 155, 122. Plan to continue basal insulin 40 units daily, pre-meal insulin aspart 4 units and insulin sliding scale.  On atorvastatin. Patient will be NPO past midnight, will decrease basal insulin to 20 units only for today.   3. HTN. On lisinopril 10 mg daily and carvedilol 25 mg bid for blood pressure control.   4. Chronic diastolic heart failure  No exacerbation.  5. Depression On paroxetine.   Patient continue to be at high risk for worsening foot infection     Patient continue to be at high risk for worsening infection   Status is: Inpatient  Remains inpatient appropriate because:IV treatments appropriate due to intensity of illness or inability to take PO   Dispo: The patient is from: Home              Anticipated d/c is to: SNF              Anticipated d/c date is: 3 days  Patient currently is not medically stable to d/c.   DVT prophylaxis: Enoxaparin   Code Status:   DNR Family Communication:  No family at the bedside      Nutrition Status: Nutrition Problem: Increased  nutrient needs Etiology: wound healing Signs/Symptoms: estimated needs Interventions: Prostat, Other (Comment) (Ensure Max)     Consultants:   Orthopedics   Procedures:     Antimicrobials:   Vancomycin, ceftriaxone and metronidazole.     Subjective: Patient complains of loose stools, no nausea or vomiting, right lower extremity pain is controlled, no chest pain or dyspnea,.   Objective: Vitals:   06/20/20 1347 06/20/20 2102 06/21/20 0447 06/21/20 0810  BP: (!) 113/55 (!) 125/53 (!) 149/60 (!) 151/63  Pulse: 65 79 71 66  Resp: 18 16 18 20   Temp: 98.2 F (36.8 C) 98.1 F (36.7 C) 97.8 F (36.6 C) 97.8 F (36.6 C)  TempSrc: Oral Oral Oral Oral  SpO2: 96% 96% 98% 97%  Weight:      Height:        Intake/Output Summary (Last 24 hours) at 06/21/2020 1203 Last data filed at 06/20/2020 1822 Gross per 24 hour  Intake 2531.19 ml  Output --  Net 2531.19 ml   Filed Weights   06/18/20 1503 06/19/20 1149  Weight: 75.8 kg 75.9 kg    Examination:   General: Not in pain or dyspnea. Deconditioned  Neurology: Awake and alert, non focal  E ZDG:UYQI pallor, no icterus, oral mucosa moist Cardiovascular: No JVD. S1-S2 present, rhythmic, no gallops, rubs, or murmurs. No lower extremity edema. Pulmonary: positive breath sounds bilaterally, adequate air movement, no wheezing, rhonchi or rales. Gastrointestinal. Abdomen soft and non tender Skin. No rashes Musculoskeletal: no joint deformities     Data Reviewed: I have personally reviewed following labs and imaging studies  CBC: Recent Labs  Lab 06/18/20 1513 06/20/20 0227 06/21/20 0447  WBC 14.3* 9.0 8.2  NEUTROABS 10.2*  --  5.0  HGB 9.8* 8.4* 9.0*  HCT 31.3* 26.6* 28.1*  MCV 85.5 84.7 84.6  PLT 443* 405* 347*   Basic Metabolic Panel: Recent Labs  Lab 06/18/20 1513 06/20/20 0227 06/21/20 0447  NA 137 138 139  K 4.2 3.9 4.0  CL 100 104 105  CO2 25 25 25   GLUCOSE 76 189* 169*  BUN 15 24* 23  CREATININE  0.68 0.76 0.63  CALCIUM 9.3 8.8* 9.1   GFR: Estimated Creatinine Clearance: 51.9 mL/min (by C-G formula based on SCr of 0.63 mg/dL). Liver Function Tests: Recent Labs  Lab 06/18/20 1513  AST 19  ALT 15  ALKPHOS 66  BILITOT 0.4  PROT 6.8  ALBUMIN 3.1*   No results for input(s): LIPASE, AMYLASE in the last 168 hours. No results for input(s): AMMONIA in the last 168 hours. Coagulation Profile: No results for input(s): INR, PROTIME in the last 168 hours. Cardiac Enzymes: No results for input(s): CKTOTAL, CKMB, CKMBINDEX, TROPONINI in the last 168 hours. BNP (last 3 results) No results for input(s): PROBNP in the last 8760 hours. HbA1C: No results for input(s): HGBA1C in the last 72 hours. CBG: Recent Labs  Lab 06/20/20 1203 06/20/20 1701 06/20/20 2101 06/21/20 0619 06/21/20 1140  GLUCAP 125* 152* 165* 155* 122*   Lipid Profile: No results for input(s): CHOL, HDL, LDLCALC, TRIG, CHOLHDL, LDLDIRECT in the last 72 hours. Thyroid Function Tests: No results for input(s): TSH, T4TOTAL, FREET4, T3FREE, THYROIDAB in the last 72 hours. Anemia Panel: No results for input(s): VITAMINB12, FOLATE, FERRITIN, TIBC, IRON,  RETICCTPCT in the last 72 hours.    Radiology Studies: I have reviewed all of the imaging during this hospital visit personally     Scheduled Meds: . (feeding supplement) PROSource Plus  30 mL Oral Daily  . atorvastatin  20 mg Oral QPM  . carvedilol  25 mg Oral BID WC  . docusate sodium  100 mg Oral BID  . insulin aspart  0-15 Units Subcutaneous TID WC  . insulin aspart  0-5 Units Subcutaneous QHS  . insulin aspart  4 Units Subcutaneous TID WC  . insulin glargine  40 Units Subcutaneous QHS  . lisinopril  10 mg Oral QPM  . metroNIDAZOLE  500 mg Oral Q8H  . nutrition supplement (JUVEN)  1 packet Oral BID BM  . pantoprazole  40 mg Oral Daily  . PARoxetine  40 mg Oral QHS  . Ensure Max Protein  11 oz Oral Daily  . sodium chloride flush  3 mL Intravenous  Q12H   Continuous Infusions: . cefTRIAXone (ROCEPHIN)  IV 2 g (06/20/20 1419)  . vancomycin 750 mg (06/20/20 2358)     LOS: 2 days        Danarius Mcconathy Gerome Apley, MD

## 2020-06-21 NOTE — Plan of Care (Signed)
  Problem: Clinical Measurements: Goal: Ability to maintain clinical measurements within normal limits will improve Outcome: Progressing Goal: Will remain free from infection Outcome: Progressing   Problem: Activity: Goal: Risk for activity intolerance will decrease Outcome: Progressing   

## 2020-06-21 NOTE — Progress Notes (Signed)
Nutrition Follow-up  DOCUMENTATION CODES:   Not applicable  INTERVENTION:   -D/c Ensure Max -Increase 30 ml Prosource Plus to BID, each supplement provides 100 kcals and 15 grams protein -Continue 1 packet Juven BID, each packet provides 95 calories, 2.5 grams of protein (collagen), and 9.8 grams of carbohydrate (3 grams sugar); also contains 7 grams of L-arginine and L-glutamine, 300 mg vitamin C, 15 mg vitamin E, 1.2 mcg vitamin B-12, 9.5 mg zinc, 200 mg calcium, and 1.5 g  Calcium Beta-hydroxy-Beta-methylbutyrate to support wound healing -MVI with minerals daily  NUTRITION DIAGNOSIS:   Increased nutrient needs related to wound healing as evidenced by estimated needs.  Ongoing  GOAL:   Patient will meet greater than or equal to 90% of their needs  Progressing   MONITOR:   PO intake, Supplement acceptance, Labs, Weight trends  REASON FOR ASSESSMENT:   Consult Assessment of nutrition requirement/status  ASSESSMENT:   86 y.o. female with medical history of DM, PVD requiring R midfoot amputation in 02/2020, CAD, HTN, hypothyroidism, anxiety, GERD, and CHF. She presented to the ED due to R foot infection. She had been on wound vac until 1 week ago and was started on doxy 11/29. Patient reported intermittent shooting-type pain.  Reviewed I/O's: +2.8 L x 24 hours and + 3 L since admission  Case discussed with RN, who reports pt is consuming meals and supplements well. She confirms plan for rt BKA tomorrow (06/22/20).   Spoke with pt at bedside, who was pleasant and in good spirits today. She reports great appetite both currently and PTA. PTA she consumes 3 meals and an HS snack daily (Breakfast: cold cereal OR eggs and toast; Lunch: sandwich; Dinner: fish and vegetables; HS snack: apple). Pt reports she has been consuming most of her meals (noted meal completion 75-100%); pt complains of diarrhea, which she attributes to antibiotics.   Pt denies any weight loss. She reports UBW  is around 166#.   Discussed importance of good meal and supplement intake to support post-operative healing. She is amenable to continue supplements. She shares that she is a little nervous about surgery tomorrow; provided pt with emotional support.   Medications reviewed and include pepto bismol, flagyl, florastor, and 0.9% sodium chloride infusion @ 75 ml/hr.   Labs reviewed: CBGS: 122-165 (inpatient orders for glycemic control are 0-15 units insulin aspart TID with meals, 0-5 units insulin aspart daily at bedtime, 4 units insulin aspart TID with meals, and 20 units insulin glargine daily).   NUTRITION - FOCUSED PHYSICAL EXAM:    Most Recent Value  Orbital Region No depletion  Upper Arm Region No depletion  Thoracic and Lumbar Region No depletion  Buccal Region No depletion  Temple Region No depletion  Clavicle Bone Region No depletion  Clavicle and Acromion Bone Region No depletion  Scapular Bone Region No depletion  Dorsal Hand Mild depletion  Patellar Region Mild depletion  Anterior Thigh Region Mild depletion  Posterior Calf Region Mild depletion  Edema (RD Assessment) None  Hair Reviewed  Eyes Reviewed  Mouth Reviewed  Skin Reviewed  Nails Reviewed       Diet Order:   Diet Order            Diet NPO time specified  Diet effective midnight           Diet heart healthy/carb modified Room service appropriate? Yes; Fluid consistency: Thin  Diet effective now  EDUCATION NEEDS:   Education needs have been addressed  Skin:  Skin Assessment: Skin Integrity Issues: Skin Integrity Issues:: Incisions Incisions: rt foot  Last BM:  06/21/20  Height:   Ht Readings from Last 1 Encounters:  06/19/20 5\' 8"  (1.727 m)    Weight:   Wt Readings from Last 1 Encounters:  06/19/20 75.9 kg   BMI:  Body mass index is 25.44 kg/m.  Estimated Nutritional Needs:   Kcal:  2000-2200  Protein:  105-120 grams  Fluid:  > 2 L    Loistine Chance, RD, LDN,  [place] Registered Dietitian II Certified Diabetes Care and Education Specialist Please refer to [facility] for RD and/or RD on-call/weekend/after hours pager

## 2020-06-21 NOTE — Patient Outreach (Signed)
  [facility]) Care Management Chronic Special Needs Program    06/21/2020  Name: [name], DOB: 1932-05-23  MRN: [id number]   Ms. [name] is enrolled in a chronic special needs plan for Diabetes. RNCM received notification. Client admitted to hospital 06/19/20 for right foot infection.   Client is active with [facility]. RNCM will update [facility] of client's admission.  Plan: Care Coordinate with hospital liaison and inpatient care management team as needed. RNCM notified [facility] hospital liaison. [facility] will continue to provide services for this member through 07/16/2020. [facility] Care Management Team will assume care 07/17/2020. Care Coordinate with [facility] as indicated. Care plan sent to [facility] Utilization management team.  Thea Silversmith, RN, MSN, [place] [phone]

## 2020-06-21 NOTE — Progress Notes (Signed)
Subjective: [name] is doing well today but c/o diarrhea.  She denies any pain in her foot.  Her priest is visiting.  She is on the OR schedule for tomorrow at 2:30 for R BKA.  Objective: Vital signs in last 24 hours: Temp:  [97.8 F (36.6 C)-98.2 F (36.8 C)] 97.8 F (36.6 C) (12/06 0810) Pulse Rate:  [65-79] 66 (12/06 0810) Resp:  [16-20] 20 (12/06 0810) BP: (113-151)/(53-63) 151/63 (12/06 0810) SpO2:  [96 %-98 %] 97 % (12/06 0810)  Intake/Output from previous day: 12/05 0701 - 12/06 0700 In: 2771.2 [P.O.:720; I.V.:1700.6; IV Piggyback:350.6] Out: -  Intake/Output this shift: No intake/output data recorded.  Recent Labs    06/18/20 1513 06/20/20 0227 06/21/20 0447  HGB 9.8* 8.4* 9.0*   Recent Labs    06/20/20 0227 06/21/20 0447  WBC 9.0 8.2  RBC 3.14* 3.32*  HCT 26.6* 28.1*  PLT 405* 420*   Recent Labs    06/20/20 0227 06/21/20 0447  NA 138 139  K 3.9 4.0  CL 104 105  CO2 25 25  BUN 24* 23  CREATININE 0.76 0.63  GLUCOSE 189* 169*  CALCIUM 8.8* 9.1   No results for input(s): LABPT, INR in the last 72 hours.      Assessment/Plan: R foot osteomyelitis and chronic diabetic foot ucler - to OR for R BKA tomorrow.  Pre op orders entered.  NPO per anesthesia ERAS pathway.  Hold blood thinners.  The risks and benefits of the alternative treatment options have been discussed in detail.  The patient wishes to proceed with surgery and specifically understands risks of bleeding, infection, nerve damage, blood clots, need for additional surgery, amputation and death.  I spoke with her son, [name], and updated him on the plan and post op expectations.     Wylene Simmer 06/21/2020, 12:09 PM

## 2020-06-21 NOTE — Consult Note (Signed)
   [facility] Inpatient Consult   06/21/2020  [name] 1931/10/23 [id number]  [facility] [ACO] Patient:  HTA CSNP  Was made aware of admission from [facility]. Patient is currently active with [facility] for chronic disease management services.  Patient has been engaged by a [facility] Management Coordinator for the C-SNP program.  Our community based plan of care has focused on disease management.  Plan: Will follow with Inpatient Transition Of Care [TOC] team member to make aware that [facility] following.   Of note, [facility] Management services does not replace or interfere with any services that are needed or arranged by inpatient [facility] care management team.  For additional questions or referrals please contact:   Natividad Brood, RN BSN [facility] Liaison  [phone] business mobile phone Toll free office [phone]  Fax number: [phone] [email or web address]

## 2020-06-21 NOTE — Consult Note (Signed)
PV Navigator Consult. Chart reviewed. Working from remote location.   Reason for consult: Diabetic infection R foot with non healing R TMA amputation site.   Patient sent to ED by [facility] RN caring for non-healing R TMA amputation wound that exhibited decline in progress, increase in redness, pain and drainage. Patient is also followed by [facility].  Hospital course included change in antibiotics, MRI R foot (suggests + osteo) and ortho consult. Patient is scheduled for R BKA tomorrow with Dr Doran Durand.   Medical history significant for PVD, DM, CAD, HTN and CHF. She has had multiple attempts at revascularization in the past due to chronic arterial occlusive disease.  Consults in place for [facility], Registered Dietitian, Diabetes Coordinator. She is set to transition from [facility] for Diabetes to [facility] Management Team on 07/17/2020.  No additional needs at this time. Will follow and remain available to this patient post R BKA to determine needs with transitioning care. Patient will need to keep appointments for vascular studies to follow L leg status and Wound Care [place] services for post op care if discharged to home.  Thank you. Cletis Media RN BSN CWS [place]

## 2020-06-22 ENCOUNTER — Inpatient Hospital Stay (HOSPITAL_COMMUNITY): Payer: HMO | Admitting: Certified Registered Nurse Anesthetist

## 2020-06-22 ENCOUNTER — Encounter (HOSPITAL_COMMUNITY): Payer: Self-pay | Admitting: Internal Medicine

## 2020-06-22 ENCOUNTER — Encounter (HOSPITAL_COMMUNITY)
Admission: EM | Disposition: A | Discharge status: Skilled Nursing Facility | Payer: Self-pay | Source: Home / Self Care | Attending: Internal Medicine

## 2020-06-22 HISTORY — PX: AMPUTATION: SHX166

## 2020-06-22 LAB — CBC WITH DIFFERENTIAL/PLATELET
Abs Immature Granulocytes: 0.05 10*3/uL (ref 0.00–0.07)
Basophils Absolute: 0.1 10*3/uL (ref 0.0–0.1)
Basophils Relative: 1 %
Eosinophils Absolute: 0.5 10*3/uL (ref 0.0–0.5)
Eosinophils Relative: 5 %
HCT: 29.7 % — ABNORMAL LOW (ref 36.0–46.0)
Hemoglobin: 9 g/dL — ABNORMAL LOW (ref 12.0–15.0)
Immature Granulocytes: 1 %
Lymphocytes Relative: 15 %
Lymphs Abs: 1.6 10*3/uL (ref 0.7–4.0)
MCH: 26.1 pg (ref 26.0–34.0)
MCHC: 30.3 g/dL (ref 30.0–36.0)
MCV: 86.1 fL (ref 80.0–100.0)
Monocytes Absolute: 0.9 10*3/uL (ref 0.1–1.0)
Monocytes Relative: 8 %
Neutro Abs: 7.6 10*3/uL (ref 1.7–7.7)
Neutrophils Relative %: 70 %
Platelets: 412 10*3/uL — ABNORMAL HIGH (ref 150–400)
RBC: 3.45 MIL/uL — ABNORMAL LOW (ref 3.87–5.11)
RDW: 15.2 % (ref 11.5–15.5)
WBC: 10.7 10*3/uL — ABNORMAL HIGH (ref 4.0–10.5)
nRBC: 0 % (ref 0.0–0.2)

## 2020-06-22 LAB — GLUCOSE, CAPILLARY
Glucose-Capillary: 113 mg/dL — ABNORMAL HIGH (ref 70–99)
Glucose-Capillary: 110 mg/dL — ABNORMAL HIGH (ref 70–99)
Glucose-Capillary: 102 mg/dL — ABNORMAL HIGH (ref 70–99)
Glucose-Capillary: 105 mg/dL — ABNORMAL HIGH (ref 70–99)
Glucose-Capillary: 195 mg/dL — ABNORMAL HIGH (ref 70–99)
Glucose-Capillary: 224 mg/dL — ABNORMAL HIGH (ref 70–99)

## 2020-06-22 LAB — SURGICAL PCR SCREEN
MRSA, PCR: NEGATIVE
Staphylococcus aureus: POSITIVE — AB

## 2020-06-22 LAB — AEROBIC CULTURE W GRAM STAIN (SUPERFICIAL SPECIMEN): Gram Stain: NONE SEEN

## 2020-06-22 SURGERY — AMPUTATION BELOW KNEE
Anesthesia: General | Site: Knee | Laterality: Right

## 2020-06-22 MED ORDER — LACTATED RINGERS IV SOLN: INTRAVENOUS | Status: DC

## 2020-06-22 MED ORDER — LIDOCAINE HCL (PF) 2 % IJ SOLN
INTRAMUSCULAR | Status: AC
  Filled 2020-06-22: qty 5

## 2020-06-22 MED ORDER — BUPIVACAINE LIPOSOME 1.3 % IJ SUSP
INTRAMUSCULAR | Status: DC | PRN
  Administered 2020-06-22: 20 mL

## 2020-06-22 MED ORDER — OXYCODONE HCL 5 MG PO TABS
5.0000 mg | ORAL_TABLET | ORAL | Status: DC | PRN
  Administered 2020-06-23 – 2020-06-24 (×3): 5 mg via ORAL
  Filled 2020-06-22: qty 2
  Filled 2020-06-22 (×3): qty 1

## 2020-06-22 MED ORDER — VANCOMYCIN HCL 500 MG IV SOLR
INTRAVENOUS | Status: DC | PRN
  Administered 2020-06-22: 500 mg

## 2020-06-22 MED ORDER — LIDOCAINE 2% (20 MG/ML) 5 ML SYRINGE
INTRAMUSCULAR | Status: DC | PRN
  Administered 2020-06-22: 100 mg via INTRAVENOUS

## 2020-06-22 MED ORDER — OXYCODONE HCL 5 MG PO TABS: 10.0000 mg | ORAL_TABLET | ORAL | Status: DC | PRN

## 2020-06-22 MED ORDER — HYDROMORPHONE HCL 1 MG/ML IJ SOLN
0.5000 mg | INTRAMUSCULAR | Status: DC | PRN
  Administered 2020-06-22: 0.5 mg via INTRAVENOUS
  Filled 2020-06-22: qty 1

## 2020-06-22 MED ORDER — MIDAZOLAM HCL 2 MG/2ML IJ SOLN
INTRAMUSCULAR | Status: AC
  Filled 2020-06-22: qty 2

## 2020-06-22 MED ORDER — SUCCINYLCHOLINE CHLORIDE 200 MG/10ML IV SOSY
PREFILLED_SYRINGE | INTRAVENOUS | Status: AC
  Filled 2020-06-22: qty 10

## 2020-06-22 MED ORDER — CEFAZOLIN SODIUM-DEXTROSE 2-4 GM/100ML-% IV SOLN
INTRAVENOUS | Status: AC
  Filled 2020-06-22: qty 100

## 2020-06-22 MED ORDER — BUPIVACAINE LIPOSOME 1.3 % IJ SUSP
20.0000 mL | Freq: Once | INTRAMUSCULAR | Status: DC
  Filled 2020-06-22: qty 20

## 2020-06-22 MED ORDER — FENTANYL CITRATE (PF) 100 MCG/2ML IJ SOLN
INTRAMUSCULAR | Status: DC | PRN
  Administered 2020-06-22 (×2): 50 ug via INTRAVENOUS

## 2020-06-22 MED ORDER — FENTANYL CITRATE (PF) 100 MCG/2ML IJ SOLN
25.0000 ug | INTRAMUSCULAR | Status: DC | PRN
  Administered 2020-06-22 (×3): 25 ug via INTRAVENOUS

## 2020-06-22 MED ORDER — SENNA 8.6 MG PO TABS
1.0000 | ORAL_TABLET | Freq: Two times a day (BID) | ORAL | Status: DC
  Administered 2020-06-22 – 2020-06-28 (×5): 8.6 mg via ORAL
  Filled 2020-06-22 (×12): qty 1

## 2020-06-22 MED ORDER — METHOCARBAMOL 500 MG PO TABS: 500.0000 mg | ORAL_TABLET | Freq: Four times a day (QID) | ORAL | Status: DC | PRN

## 2020-06-22 MED ORDER — ORAL CARE MOUTH RINSE: 15.0000 mL | Freq: Once | OROMUCOSAL | Status: AC

## 2020-06-22 MED ORDER — ONDANSETRON HCL 4 MG PO TABS: 4.0000 mg | ORAL_TABLET | Freq: Four times a day (QID) | ORAL | Status: DC | PRN

## 2020-06-22 MED ORDER — SODIUM CHLORIDE 0.9 % IV SOLN: INTRAVENOUS | Status: DC

## 2020-06-22 MED ORDER — SODIUM CHLORIDE (PF) 0.9 % IJ SOLN
INTRAMUSCULAR | Status: DC | PRN
  Administered 2020-06-22: 10 mL via INTRAVENOUS

## 2020-06-22 MED ORDER — FENTANYL CITRATE (PF) 100 MCG/2ML IJ SOLN: 25.0000 ug | INTRAMUSCULAR | Status: DC | PRN

## 2020-06-22 MED ORDER — METHOCARBAMOL 1000 MG/10ML IJ SOLN
500.0000 mg | Freq: Four times a day (QID) | INTRAVENOUS | Status: DC | PRN
  Filled 2020-06-22: qty 5

## 2020-06-22 MED ORDER — CHLORHEXIDINE GLUCONATE CLOTH 2 % EX PADS
6.0000 | MEDICATED_PAD | Freq: Every day | CUTANEOUS | Status: AC
  Administered 2020-06-22 – 2020-06-26 (×5): 6 via TOPICAL

## 2020-06-22 MED ORDER — VANCOMYCIN HCL 1000 MG IV SOLR
INTRAVENOUS | Status: AC
  Filled 2020-06-22: qty 1000

## 2020-06-22 MED ORDER — PROPOFOL 10 MG/ML IV BOLUS
INTRAVENOUS | Status: AC
  Filled 2020-06-22: qty 20

## 2020-06-22 MED ORDER — ACETAMINOPHEN 325 MG PO TABS
325.0000 mg | ORAL_TABLET | Freq: Four times a day (QID) | ORAL | Status: DC | PRN
  Administered 2020-06-23 – 2020-06-27 (×5): 650 mg via ORAL
  Filled 2020-06-22 (×5): qty 2

## 2020-06-22 MED ORDER — ONDANSETRON HCL 4 MG/2ML IJ SOLN: 4.0000 mg | Freq: Four times a day (QID) | INTRAMUSCULAR | Status: DC | PRN

## 2020-06-22 MED ORDER — FENTANYL CITRATE (PF) 100 MCG/2ML IJ SOLN
INTRAMUSCULAR | Status: AC
  Filled 2020-06-22: qty 2

## 2020-06-22 MED ORDER — PROPOFOL 10 MG/ML IV BOLUS
INTRAVENOUS | Status: DC | PRN
  Administered 2020-06-22: 165 mg via INTRAVENOUS

## 2020-06-22 MED ORDER — VANCOMYCIN HCL 500 MG IV SOLR
INTRAVENOUS | Status: AC
  Filled 2020-06-22: qty 500

## 2020-06-22 MED ORDER — DEXAMETHASONE SODIUM PHOSPHATE 10 MG/ML IJ SOLN
INTRAMUSCULAR | Status: DC | PRN
  Administered 2020-06-22: 5 mg via INTRAVENOUS

## 2020-06-22 MED ORDER — MIDAZOLAM HCL 5 MG/5ML IJ SOLN
INTRAMUSCULAR | Status: DC | PRN
  Administered 2020-06-22: 1 mg via INTRAVENOUS

## 2020-06-22 MED ORDER — DOCUSATE SODIUM 100 MG PO CAPS
100.0000 mg | ORAL_CAPSULE | Freq: Two times a day (BID) | ORAL | Status: DC
  Administered 2020-06-22 – 2020-06-24 (×3): 100 mg via ORAL
  Filled 2020-06-22 (×4): qty 1

## 2020-06-22 MED ORDER — CHLORHEXIDINE GLUCONATE 0.12 % MT SOLN
15.0000 mL | Freq: Once | OROMUCOSAL | Status: AC
  Administered 2020-06-22: 15 mL via OROMUCOSAL
  Filled 2020-06-22: qty 15

## 2020-06-22 MED ORDER — DIPHENHYDRAMINE HCL 12.5 MG/5ML PO ELIX: 12.5000 mg | ORAL_SOLUTION | ORAL | Status: DC | PRN

## 2020-06-22 MED ORDER — ONDANSETRON HCL 4 MG/2ML IJ SOLN
INTRAMUSCULAR | Status: DC | PRN
  Administered 2020-06-22: 4 mg via INTRAVENOUS

## 2020-06-22 MED ORDER — ENOXAPARIN SODIUM 40 MG/0.4ML ~~LOC~~ SOLN
40.0000 mg | SUBCUTANEOUS | Status: DC
  Administered 2020-06-23 – 2020-06-28 (×6): 40 mg via SUBCUTANEOUS
  Filled 2020-06-22 (×6): qty 0.4

## 2020-06-22 MED ORDER — 0.9 % SODIUM CHLORIDE (POUR BTL) OPTIME
TOPICAL | Status: DC | PRN
  Administered 2020-06-22: 1000 mL

## 2020-06-22 MED ORDER — MUPIROCIN 2 % EX OINT
1.0000 "application " | TOPICAL_OINTMENT | Freq: Two times a day (BID) | CUTANEOUS | Status: AC
  Administered 2020-06-22 – 2020-06-26 (×10): 1 via NASAL
  Filled 2020-06-22 (×2): qty 22

## 2020-06-22 MED ORDER — FENTANYL CITRATE (PF) 250 MCG/5ML IJ SOLN
INTRAMUSCULAR | Status: AC
  Filled 2020-06-22: qty 5

## 2020-06-22 SURGICAL SUPPLY — 62 items
APL PRP STRL LF DISP 70% ISPRP (MISCELLANEOUS)
BANDAGE ESMARK 6X9 LF (GAUZE/BANDAGES/DRESSINGS) IMPLANT
BLADE SAW RECIP 87.9 MT (BLADE) ×2 IMPLANT
BNDG CMPR 9X6 STRL LF SNTH (GAUZE/BANDAGES/DRESSINGS) ×1
BNDG CMPR MED 10X6 ELC LF (GAUZE/BANDAGES/DRESSINGS) ×1
BNDG COHESIVE 6X5 TAN STRL LF (GAUZE/BANDAGES/DRESSINGS) ×1 IMPLANT
BNDG ELASTIC 6X10 VLCR STRL LF (GAUZE/BANDAGES/DRESSINGS) ×1 IMPLANT
BNDG ELASTIC 6X5.8 VLCR STR LF (GAUZE/BANDAGES/DRESSINGS) IMPLANT
BNDG ESMARK 6X9 LF (GAUZE/BANDAGES/DRESSINGS) ×2
CHLORAPREP W/TINT 26 (MISCELLANEOUS) ×1 IMPLANT
COVER SURGICAL LIGHT HANDLE (MISCELLANEOUS) ×3 IMPLANT
COVER WAND RF STERILE (DRAPES) ×1 IMPLANT
CUFF TOURN SGL QUICK 24 (TOURNIQUET CUFF) ×2
CUFF TOURN SGL QUICK 34 (TOURNIQUET CUFF)
CUFF TOURN SGL QUICK 42 (TOURNIQUET CUFF) IMPLANT
CUFF TRNQT CYL 24X4X16.5-23 (TOURNIQUET CUFF) IMPLANT
CUFF TRNQT CYL 34X4.125X (TOURNIQUET CUFF) ×1 IMPLANT
DRAPE EXTREMITY T 121X128X90 (DISPOSABLE) ×1 IMPLANT
DRAPE HALF SHEET 40X57 (DRAPES) ×1 IMPLANT
DRAPE INCISE IOBAN 66X45 STRL (DRAPES) ×2 IMPLANT
DRAPE U-SHAPE 47X51 STRL (DRAPES) ×4 IMPLANT
DRSG MEPITEL 4X7.2 (GAUZE/BANDAGES/DRESSINGS) ×2 IMPLANT
DRSG PAD ABDOMINAL 8X10 ST (GAUZE/BANDAGES/DRESSINGS) ×5 IMPLANT
ELECT CAUTERY BLADE 6.4 (BLADE) ×1 IMPLANT
ELECT REM PT RETURN 9FT ADLT (ELECTROSURGICAL) ×2
ELECTRODE REM PT RTRN 9FT ADLT (ELECTROSURGICAL) ×1 IMPLANT
EVACUATOR 1/8 PVC DRAIN (DRAIN) IMPLANT
GAUZE SPONGE 4X4 12PLY STRL (GAUZE/BANDAGES/DRESSINGS) ×1 IMPLANT
GAUZE SPONGE 4X4 12PLY STRL LF (GAUZE/BANDAGES/DRESSINGS) ×1 IMPLANT
GLOVE BIO SURGEON STRL SZ8 (GLOVE) ×2 IMPLANT
GLOVE BIOGEL PI IND STRL 8 (GLOVE) ×2 IMPLANT
GLOVE BIOGEL PI INDICATOR 8 (GLOVE) ×2
GLOVE ECLIPSE 8.0 STRL XLNG CF (GLOVE) ×4 IMPLANT
GOWN STRL REUS W/ TWL LRG LVL3 (GOWN DISPOSABLE) ×1 IMPLANT
GOWN STRL REUS W/ TWL XL LVL3 (GOWN DISPOSABLE) ×1 IMPLANT
GOWN STRL REUS W/TWL LRG LVL3 (GOWN DISPOSABLE) ×2
GOWN STRL REUS W/TWL XL LVL3 (GOWN DISPOSABLE) ×2
KIT BASIN OR (CUSTOM PROCEDURE TRAY) ×2 IMPLANT
KIT TURNOVER KIT B (KITS) ×2 IMPLANT
MANIFOLD NEPTUNE II (INSTRUMENTS) ×1 IMPLANT
NEEDLE 22X1 1/2 (OR ONLY) (NEEDLE) ×1 IMPLANT
NS IRRIG 1000ML POUR BTL (IV SOLUTION) ×2 IMPLANT
PACK GENERAL/GYN (CUSTOM PROCEDURE TRAY) ×2 IMPLANT
PAD ARMBOARD 7.5X6 YLW CONV (MISCELLANEOUS) ×4 IMPLANT
PAD CAST 4YDX4 CTTN HI CHSV (CAST SUPPLIES) ×1 IMPLANT
PADDING CAST COTTON 4X4 STRL (CAST SUPPLIES) ×2
PADDING CAST COTTON 6X4 STRL (CAST SUPPLIES) ×2 IMPLANT
SPONGE LAP 18X18 RF (DISPOSABLE) IMPLANT
STAPLER VISISTAT 35W (STAPLE) ×1 IMPLANT
STOCKINETTE IMPERVIOUS LG (DRAPES) ×1 IMPLANT
SUT ETHILON 2 0 FS 18 (SUTURE) ×4 IMPLANT
SUT MNCRL AB 3-0 PS2 18 (SUTURE) ×3 IMPLANT
SUT PDS AB 1 CT  36 (SUTURE) ×4
SUT PDS AB 1 CT 36 (SUTURE) ×2 IMPLANT
SUT SILK 2 0 (SUTURE) ×2
SUT SILK 2-0 18XBRD TIE 12 (SUTURE) ×1 IMPLANT
SWAB COLLECTION DEVICE MRSA (MISCELLANEOUS) IMPLANT
SWAB CULTURE ESWAB REG 1ML (MISCELLANEOUS) IMPLANT
SYR 30ML LL (SYRINGE) ×1 IMPLANT
TOWEL GREEN STERILE (TOWEL DISPOSABLE) ×2 IMPLANT
TOWEL GREEN STERILE FF (TOWEL DISPOSABLE) ×2 IMPLANT
WATER STERILE IRR 1000ML POUR (IV SOLUTION) ×2 IMPLANT

## 2020-06-22 NOTE — Anesthesia Postprocedure Evaluation (Signed)
Anesthesia Post Note  Patient: [name]  Procedure(s) Performed: AMPUTATION BELOW KNEE (Right Knee)     Patient location during evaluation: PACU Anesthesia Type: General Level of consciousness: awake Pain management: pain level controlled Vital Signs Assessment: post-procedure vital signs reviewed and stable Respiratory status: spontaneous breathing Cardiovascular status: stable Postop Assessment: no apparent nausea or vomiting Anesthetic complications: no   No complications documented.  Last Vitals:  Vitals:   06/22/20 0300 06/22/20 0759  BP: 122/66 123/65  Pulse: (!) 59 64  Resp: 16 17  Temp: 36.7 C 36.8 C  SpO2: 96% 97%    Last Pain:  Vitals:   06/22/20 0759  TempSrc: Oral  PainSc:                  [name]

## 2020-06-22 NOTE — Transfer of Care (Signed)
Immediate Anesthesia Transfer of Care Note  Patient: [name]  Procedure(s) Performed: AMPUTATION BELOW KNEE (Right Knee)  Patient Location: PACU  Anesthesia Type:General  Level of Consciousness: drowsy and patient cooperative  Airway & Oxygen Therapy: Patient Spontanous Breathing and Patient connected to nasal cannula oxygen  Post-op Assessment: Report given to RN and Post -op Vital signs reviewed and stable  Post vital signs: Reviewed and stable  Last Vitals:  Vitals Value Taken Time  BP    Temp    Pulse 64 06/22/20 1643  Resp 14 06/22/20 1643  SpO2 97 % 06/22/20 1643  Vitals shown include unvalidated device data.  Last Pain:  Vitals:   06/22/20 0759  TempSrc: Oral  PainSc:       Patients Stated Pain Goal: 3 (03/75/43 6067)  Complications: No complications documented.

## 2020-06-22 NOTE — Anesthesia Procedure Notes (Signed)
Procedure Name: LMA Insertion Date/Time: 06/22/2020 3:53 PM Performed by: Moshe [place], CRNA Pre-anesthesia Checklist: Patient identified, Emergency Drugs available, Suction available and Patient being monitored Patient Re-evaluated:Patient Re-evaluated prior to induction Oxygen Delivery Method: Circle System Utilized Preoxygenation: Pre-oxygenation with 100% oxygen Induction Type: IV induction Ventilation: Mask ventilation without difficulty LMA: LMA inserted LMA Size: 5.0 Number of attempts: 1 Placement Confirmation: positive ETCO2 Tube secured with: Tape Dental Injury: Teeth and Oropharynx as per pre-operative assessment

## 2020-06-22 NOTE — Discharge Instructions (Signed)
Jeanette Simmer, MD [facility]  Please read the following information regarding your care after surgery.  Medications  You only need a prescription for the narcotic pain medicine (ex. oxycodone, Percocet, Norco).  All of the other medicines listed below are available over the counter. X Aleve 2 pills twice a day for the first 3 days after surgery. X acetominophen (Tylenol) 650 mg every 4-6 hours as you need for minor to moderate pain X oxycodone as prescribed for severe pain  Narcotic pain medicine (ex. oxycodone, Percocet, Vicodin) will cause constipation.  To prevent this problem, take the following medicines while you are taking any pain medicine. X docusate sodium (Colace) 100 mg twice a day X senna (Senokot) 2 tablets twice a day  X To help prevent blood clots, take a baby aspirin (81 mg) twice a day for two weeks after surgery.  You should also get up every hour while you are awake to move around.    Weight Bearing X Do not bear any weight on the operated leg or foot.  Cast / Splint / Dressing X Keep your splint, cast or dressing clean and dry.  Don't put anything (coat hanger, pencil, etc) down inside of it.  If it gets damp, use a hair dryer on the cool setting to dry it.  If it gets soaked, call the office to schedule an appointment for a cast change. X Stump protector on at all times.   After your dressing, cast or splint is removed; you may shower, but do not soak or scrub the wound.  Allow the water to run over it, and then gently pat it dry.  Swelling It is normal for you to have swelling where you had surgery.  To reduce swelling and pain, keep your toes above your nose for at least 3 days after surgery.  It may be necessary to keep your foot or leg elevated for several weeks.  If it hurts, it should be elevated.  Follow Up Call my office at [phone] when you are discharged from the hospital or surgery center to schedule an appointment to be seen two weeks after  surgery.  Call my office at [phone] if you develop a fever >101.5 F, nausea, vomiting, bleeding from the surgical site or severe pain.

## 2020-06-22 NOTE — Progress Notes (Signed)
PROGRESS NOTE    [name]  WCH:[id number] DOB: 19-Nov-1931 DOA: 06/18/2020 PCP: Marton Redwood, MD    Brief Narrative:  [name] was admitted to the hospital with working diagnosis of right foot infection, osteomyelitis and abscess.  86 year old female past medical history for peripheral vascular disease, hyper thyroidism, hypertension, type 2 diabetes mellitus, coronary artery disease, diastolic heart failure and diabetic foot status post right transmetatarsal amputation 8/26. As an outpatient right foot infection treated with wound VAC and multiple course of oral antibiotics, unfortunately progressive infection.On her initial physical examination blood pressure 149/59, heart rate 80, respiratory rate 18, temperature 98.4, oxygen saturation 98%, lungs clear to auscultation bilaterally, heart S1-S2, present rhythmic, soft abdomen, right lower extremity, transmetatarsal amputation, ulceration along the plantar surface with purulent drainage, positive erythema and edema. Sodium 137, potassium 4.2, chloride 100, bicarb 25, glucose 76, BUN 15, creatinine 0.68, lactic acid 1.2, white count 14.3, hemoglobin 9.8, hematocrit 31.3, platelets 443. SARS COVID-19 negative. Urinalysis negative for infection.  Right foot x-ray with transmetatarsal amputation no acute fracture or dislocation.  Right foot MRI with soft tissue wound along the plantar medial aspect of the amputation site with severe surrounding soft tissue edema likely reflecting cellulitis. Bone marrow edema in the plantar medial aspect of the medial cuneiform with cortical destruction consistent with osteomyelitis. +2.3 x 3 x 3.4 cm complex fluid collection with sinus tract to the wound at the plantar fascia of the navicular bone. Consistent with abscess.  Patient has been placed on broad-spectrum IV antibiotic therapy and orthopedics has been consulted. Plan for surgical intervention 12/7.   Assessment & Plan:   Principal  Problem:   Diabetic infection of right foot ([place]) Active Problems:   Coronary artery disease involving native coronary artery of native heart   Essential hypertension   Diabetes mellitus with peripheral vascular disease ([place])   Mixed hyperlipidemia   Chronic diastolic heart failure ([place])    1. Right diabetic foot infection, with cellulitis, osteomyelitis and abscess (present on admission). Wound culture from 06/17/20 positive for MRSA. Blood cultures with no growth. Her wbc today is 10.7, no significant pain, blood cultures with no growth.   Continue with ceftriaxone and vancomycin, discontinue metronidazole. Improved loose stools, continue with peptobismol, loperamide and probiotics Pain control with hydrocodone-acetaminophen/ morphine.  Follow nutrition recommendations. Plan for surgical intervention today.   2. T2DM/ dyslipidemia. Patient has been NPO past midnight, capillary glucose has been well controlled, fasting glucose this am 113. Dose of basal insulin has been decreased to 20 units due to NPO status for surgical intervention today.  Continue glucose cover and monitoring with insulin sliding scale/ pre-meal insulin 4 units. Continue with atorvastatin.  3. HTN. Continue blood pressure control with lisinopril and carvedilol.  4. Chronic diastolic heart failure  No clinical signs of acute exacerbation.  5. Depression Continue with paroxetine.  6. GERD. Continue with pantoprazole.    Patient continue to be at high risk for worsening foot infection   Status is: Inpatient  Remains inpatient appropriate because:IV treatments appropriate due to intensity of illness or inability to take PO   Dispo: The patient is from: Home              Anticipated d/c is to: Home              Anticipated d/c date is: 3 days              Patient currently is not medically stable to d/c.   DVT  prophylaxis: Enoxaparin   Code Status:   full  Family Communication:  No family at  the bedside      Nutrition Status: Nutrition Problem: Increased nutrient needs Etiology: wound healing Signs/Symptoms: estimated needs Interventions: Juven, Prostat, MVI     Skin Documentation:     Consultants:   Orthopedics    Antimicrobials:   Vancomycin and ceftriaxone     Subjective: Patient is feeling well, no significant pain at the wound site, no nausea or vomiting, no dyspnea or chest pain,   Objective: Vitals:   06/21/20 1432 06/21/20 1958 06/22/20 0300 06/22/20 0759  BP: (!) 136/44 (!) 126/58 122/66 123/65  Pulse: 69 62 (!) 59 64  Resp: 18 15 16 17   Temp: 98 F (36.7 C) 98.6 F (37 C) 98.1 F (36.7 C) 98.2 F (36.8 C)  TempSrc: Oral Oral Oral Oral  SpO2: 97% 96% 96% 97%  Weight:      Height:        Intake/Output Summary (Last 24 hours) at 06/22/2020 1042 Last data filed at 06/21/2020 2142 Gross per 24 hour  Intake 240 ml  Output 300 ml  Net -60 ml   Filed Weights   06/18/20 1503 06/19/20 1149  Weight: 75.8 kg 75.9 kg    Examination:   General: Not in pain or dyspnea,.  Neurology: Awake and alert, non focal  E ENT: no pallor, no icterus, oral mucosa moist Cardiovascular: No JVD. S1-S2 present, rhythmic, no gallops, rubs, or murmurs. No lower extremity edema. Pulmonary: positive breath sounds bilaterally, adequate air movement, no wheezing, rhonchi or rales. Gastrointestinal. Abdomen soft and non tender Skin. No rashes Musculoskeletal: right foot with dressing in place.      Data Reviewed: I have personally reviewed following labs and imaging studies  CBC: Recent Labs  Lab 06/18/20 1513 06/20/20 0227 06/21/20 0447 06/22/20 0514  WBC 14.3* 9.0 8.2 10.7*  NEUTROABS 10.2*  --  5.0 7.6  HGB 9.8* 8.4* 9.0* 9.0*  HCT 31.3* 26.6* 28.1* 29.7*  MCV 85.5 84.7 84.6 86.1  PLT 443* 405* 420* 536*   Basic Metabolic Panel: Recent Labs  Lab 06/18/20 1513 06/20/20 0227 06/21/20 0447  NA 137 138 139  K 4.2 3.9 4.0  CL 100 104 105   CO2 25 25 25   GLUCOSE 76 189* 169*  BUN 15 24* 23  CREATININE 0.68 0.76 0.63  CALCIUM 9.3 8.8* 9.1   GFR: Estimated Creatinine Clearance: 51.9 mL/min (by C-G formula based on SCr of 0.63 mg/dL). Liver Function Tests: Recent Labs  Lab 06/18/20 1513  AST 19  ALT 15  ALKPHOS 66  BILITOT 0.4  PROT 6.8  ALBUMIN 3.1*   No results for input(s): LIPASE, AMYLASE in the last 168 hours. No results for input(s): AMMONIA in the last 168 hours. Coagulation Profile: No results for input(s): INR, PROTIME in the last 168 hours. Cardiac Enzymes: No results for input(s): CKTOTAL, CKMB, CKMBINDEX, TROPONINI in the last 168 hours. BNP (last 3 results) No results for input(s): PROBNP in the last 8760 hours. HbA1C: No results for input(s): HGBA1C in the last 72 hours. CBG: Recent Labs  Lab 06/21/20 0619 06/21/20 1140 06/21/20 1702 06/21/20 2015 06/22/20 0642  GLUCAP 155* 122* 108* 125* 113*   Lipid Profile: No results for input(s): CHOL, HDL, LDLCALC, TRIG, CHOLHDL, LDLDIRECT in the last 72 hours. Thyroid Function Tests: No results for input(s): TSH, T4TOTAL, FREET4, T3FREE, THYROIDAB in the last 72 hours. Anemia Panel: No results for input(s): VITAMINB12, FOLATE, FERRITIN, TIBC,  IRON, RETICCTPCT in the last 72 hours.    Radiology Studies: I have reviewed all of the imaging during this hospital visit personally     Scheduled Meds: . (feeding supplement) PROSource Plus  30 mL Oral BID BM  . atorvastatin  20 mg Oral QPM  . bismuth subsalicylate  30 mL Oral TID AC & HS  . carvedilol  25 mg Oral BID WC  . Chlorhexidine Gluconate Cloth  6 each Topical Daily  . insulin aspart  0-15 Units Subcutaneous TID WC  . insulin aspart  0-5 Units Subcutaneous QHS  . insulin aspart  4 Units Subcutaneous TID WC  . insulin glargine  20 Units Subcutaneous QHS  . lisinopril  10 mg Oral QPM  . metroNIDAZOLE  500 mg Oral Q8H  . multivitamin with minerals  1 tablet Oral Daily  . mupirocin  ointment  1 application Nasal BID  . nutrition supplement (JUVEN)  1 packet Oral BID BM  . pantoprazole  40 mg Oral Daily  . PARoxetine  40 mg Oral QHS  . saccharomyces boulardii  250 mg Oral BID  . sodium chloride flush  3 mL Intravenous Q12H   Continuous Infusions: . sodium chloride 75 mL/hr at 06/21/20 2142  .  ceFAZolin (ANCEF) IV    . cefTRIAXone (ROCEPHIN)  IV 2 g (06/21/20 1419)  . vancomycin 750 mg (06/22/20 0155)     LOS: 3 days        Mersadies Petree Gerome Apley, MD

## 2020-06-22 NOTE — Interval H&P Note (Signed)
History and Physical Interval Note:  06/22/2020 3:33 PM  [name]  has presented today for surgery, with the diagnosis of RIGHT FOOT OSTEOMYELITIS.  The various methods of treatment have been discussed with the patient and family. After consideration of risks, benefits and other options for treatment, the patient has consented to  Procedure(s): AMPUTATION BELOW KNEE (Right) as a surgical intervention.  The patient's history has been reviewed, patient examined, no change in status, stable for surgery.  I have reviewed the patient's chart and labs.  Questions were answered to the patient's satisfaction.    The risks and benefits of the alternative treatment options have been discussed in detail.  The patient wishes to proceed with surgery and specifically understands risks of bleeding, infection, nerve damage, blood clots, need for additional surgery, amputation and death.  Wylene Simmer

## 2020-06-22 NOTE — Progress Notes (Signed)
Louie Casa, RN picked up wedding band.

## 2020-06-22 NOTE — Op Note (Signed)
06/22/2020  4:36 PM  PATIENT:  [name]  86 y.o. female  PRE-OPERATIVE DIAGNOSIS:  RIGHT FOOT OSTEOMYELITIS  POST-OPERATIVE DIAGNOSIS:  RIGHT FOOT OSTEOMYELITIS  Procedure(s):  Right below knee amputation  SURGEON:  Wylene Simmer, MD  ASSISTANT: Mechele Claude, PA-C  ANESTHESIA:   General  EBL:  minimal   TOURNIQUET:   Total Tourniquet Time Documented: Thigh (Right) - 27 minutes Total: Thigh (Right) - 27 minutes  COMPLICATIONS:  None apparent  DISPOSITION:  Extubated, awake and stable to recovery.  INDICATION FOR PROCEDURE: The patient is an 86 year old female with a past medical history significant for diabetes and peripheral arterial disease.  She has developed osteomyelitis related to a chronic nonhealing diabetic foot ulcer.  She has failed nonoperative treatment to date including extensive wound care, oral and IV antibiotics and previous amputations.  She presents now for right below-knee amputation.  The risks and benefits of the alternative treatment options have been discussed in detail.  The patient wishes to proceed with surgery and specifically understands risks of bleeding, infection, nerve damage, blood clots, need for additional surgery, amputation and death.  PROCEDURE IN DETAIL: After preoperative consent was obtained and the correct operative site was identified, the patient was brought to the operating room and placed supine on the operating table.  Preoperative antibiotics were administered.  General anesthesia was administered.  A surgical timeout was taken.  The right lower extremity was prepped and draped in standard sterile fashion with a tourniquet around the thigh.  The extremity was exsanguinated and the tourniquet was inflated to 250 mmHg.  A posterior flap incision was marked on the skin 15 cm distal from the medial joint line.  The incision was made and dissection was carried down through the subcutaneous tissues over the anterior aspect of the leg.  The  periosteum was incised over the tibia.  It was elevated proximally.  Soft tissues were protected as a reciprocating saw was used to cut through the tibia 1 cm proximal from the skin incision.  The fibula was then dissected free.  The soft tissues were protected and the reciprocating saw was used to cut through the fibula.  The bone hook was then used to retract the distal portion of the tibia.  The amputation knife was used to cut through the posterior soft tissues contouring the posterior flap appropriately.  The distal portion of the leg was passed off the field as a specimen to pathology.  A rasp was used to smooth the cut surfaces of bone at the distal tibia and fibula.  All named vessels were suture ligated with 2-0 silk ties.  Named nerves were transected on gentle traction.  The wound was irrigated copiously.  Vancomycin powder was sprinkled in the wound.  20 cc of Exparel and 10 cc of normal saline were mixed and infiltrated into the muscles and subcutaneous tissues around the amputation site.  The gastrocnemius fascia was then repaired to the anterior tibial periosteum with imbricating sutures of 0 PDS.  Deep subcutaneous tissues were closed with 0 PDS.  Superficial subcutaneous tissues were approximated with 3-0 Monocryl.  The skin incision was closed with staples.  Sterile dressings were applied followed by compression wrap.  The tourniquet was released after application of the dressings.  The patient was awakened from anesthesia and transported to the recovery room in stable condition.   FOLLOW UP PLAN: Nonweightbearing on the right lower extremity.  Return back to 5 N.-27.  Physical therapy and Occupational Therapy consultations  and discharge planning.  Resume Lovenox on postop day 1.  Stump protector is ordered from [facility].   Mechele Claude PA-C was present and scrubbed for the duration of the operative case. His assistance was essential in positioning the patient, prepping  and draping, gaining and maintaining exposure, performing the operation, closing and dressing the wounds and applying the splint.

## 2020-06-22 NOTE — Anesthesia Preprocedure Evaluation (Addendum)
Anesthesia Evaluation  Patient identified by MRN, date of birth, ID band Patient awake    Reviewed: Allergy & Precautions, NPO status , Patient's Chart, lab work & pertinent test results  History of Anesthesia Complications (+) PONV  Airway Mallampati: II  TM Distance: >3 FB     Dental   Pulmonary shortness of breath, pneumonia, former smoker,    breath sounds clear to auscultation       Cardiovascular hypertension, + CAD, + Peripheral Vascular Disease and +CHF   Rhythm:Regular Rate:Normal     Neuro/Psych Anxiety  Neuromuscular disease    GI/Hepatic Neg liver ROS, GERD  ,  Endo/Other  diabetesHypothyroidism   Renal/GU negative Renal ROS     Musculoskeletal   Abdominal   Peds  Hematology  (+) anemia ,   Anesthesia Other Findings   Reproductive/Obstetrics                             Anesthesia Physical Anesthesia Plan  ASA: III  Anesthesia Plan: General   Post-op Pain Management:    Induction: Intravenous  PONV Risk Score and Plan: 4 or greater and Ondansetron, Dexamethasone and Midazolam  Airway Management Planned: LMA  Additional Equipment:   Intra-op Plan:   Post-operative Plan: Extubation in OR  Informed Consent: I have reviewed the patients History and Physical, chart, labs and discussed the procedure including the risks, benefits and alternatives for the proposed anesthesia with the patient or authorized representative who has indicated his/her understanding and acceptance.     Dental advisory given  Plan Discussed with: CRNA and Anesthesiologist  Anesthesia Plan Comments:         Anesthesia Quick Evaluation

## 2020-06-22 NOTE — Plan of Care (Signed)

## 2020-06-22 NOTE — Progress Notes (Signed)
Orthopedic Tech Progress Note Patient Details:  [name] 01-29-1932 [id number] Ordered brace  Patient ID: [name], female   DOB: 11/01/1931, 86 y.o.   MRN: [id number]   [name] 06/22/2020, 7:50 PM

## 2020-06-23 ENCOUNTER — Encounter (HOSPITAL_COMMUNITY): Payer: Self-pay | Admitting: Orthopedic Surgery

## 2020-06-23 ENCOUNTER — Encounter (HOSPITAL_BASED_OUTPATIENT_CLINIC_OR_DEPARTMENT_OTHER): Payer: HMO | Admitting: Physician Assistant

## 2020-06-23 LAB — BASIC METABOLIC PANEL
Anion gap: 9 (ref 5–15)
BUN: 16 mg/dL (ref 8–23)
CO2: 23 mmol/L (ref 22–32)
Calcium: 8.3 mg/dL — ABNORMAL LOW (ref 8.9–10.3)
Chloride: 104 mmol/L (ref 98–111)
Creatinine, Ser: 0.64 mg/dL (ref 0.44–1.00)
GFR, Estimated: >60 mL/min (ref >60)
Glucose, Bld: 290 mg/dL — ABNORMAL HIGH (ref 70–99)
Potassium: 3.9 mmol/L (ref 3.5–5.1)
Sodium: 136 mmol/L (ref 135–145)

## 2020-06-23 LAB — GLUCOSE, CAPILLARY
Glucose-Capillary: 282 mg/dL — ABNORMAL HIGH (ref 70–99)
Glucose-Capillary: 200 mg/dL — ABNORMAL HIGH (ref 70–99)
Glucose-Capillary: 199 mg/dL — ABNORMAL HIGH (ref 70–99)
Glucose-Capillary: 134 mg/dL — ABNORMAL HIGH (ref 70–99)
Glucose-Capillary: 110 mg/dL — ABNORMAL HIGH (ref 70–99)

## 2020-06-23 LAB — CBC WITH DIFFERENTIAL/PLATELET
Abs Immature Granulocytes: 0.07 10*3/uL (ref 0.00–0.07)
Basophils Absolute: 0 10*3/uL (ref 0.0–0.1)
Basophils Relative: 0 %
Eosinophils Absolute: 0 10*3/uL (ref 0.0–0.5)
Eosinophils Relative: 0 %
HCT: 29.8 % — ABNORMAL LOW (ref 36.0–46.0)
Hemoglobin: 9.6 g/dL — ABNORMAL LOW (ref 12.0–15.0)
Immature Granulocytes: 1 %
Lymphocytes Relative: 6 %
Lymphs Abs: 0.7 10*3/uL (ref 0.7–4.0)
MCH: 27.2 pg (ref 26.0–34.0)
MCHC: 32.2 g/dL (ref 30.0–36.0)
MCV: 84.4 fL (ref 80.0–100.0)
Monocytes Absolute: 0.4 10*3/uL (ref 0.1–1.0)
Monocytes Relative: 3 %
Neutro Abs: 11.3 10*3/uL — ABNORMAL HIGH (ref 1.7–7.7)
Neutrophils Relative %: 90 %
Platelets: 430 10*3/uL — ABNORMAL HIGH (ref 150–400)
RBC: 3.53 MIL/uL — ABNORMAL LOW (ref 3.87–5.11)
RDW: 15.2 % (ref 11.5–15.5)
WBC: 12.5 10*3/uL — ABNORMAL HIGH (ref 4.0–10.5)
nRBC: 0 % (ref 0.0–0.2)

## 2020-06-23 MED ORDER — DM-GUAIFENESIN ER 30-600 MG PO TB12: 1.0000 | ORAL_TABLET | Freq: Two times a day (BID) | ORAL | Status: DC | PRN

## 2020-06-23 MED ORDER — INSULIN GLARGINE 100 UNIT/ML ~~LOC~~ SOLN
30.0000 [IU] | Freq: Every day | SUBCUTANEOUS | Status: DC
  Administered 2020-06-24: 30 [IU] via SUBCUTANEOUS
  Administered 2020-06-25: 15 [IU] via SUBCUTANEOUS
  Administered 2020-06-26 – 2020-06-28 (×3): 30 [IU] via SUBCUTANEOUS
  Filled 2020-06-23 (×6): qty 0.3

## 2020-06-23 NOTE — TOC Initial Note (Signed)
Transition of Care [facility]) - Initial/Assessment Note    Patient Details  Name: [name] MRN: [id number] Date of Birth: Aug 04, 1931  [facility] CM/SW Contact:    Curlene Labrum, RN Phone Number: 06/23/2020, 4:41 PM  Clinical Narrative:                 Case management met with the patient regarding transitions of care S/P right BKA.  The patient currently lives alone in an apartment and has private pay caregivers that visit the home on Tuesday and Thursday for 3 hours each day.  The patient has an available son to assist the patient as well, but he does not live in the home.  The patient is hoping for CIR admission but is agreeable to SNF placement for rehabilitation as well as back up plan.  The patient is fully vaccinated with Moderna vaccine.  Will continue to follow for CIR versus SNF placement.  Expected Discharge Plan: IP Rehab Facility Barriers to Discharge: Continued Medical Work up   Patient Goals and CMS Choice Patient states their goals for this hospitalization and ongoing recovery are:: Patient plans to discharge to CIR versus SNF placement. CMS Medicare.gov Compare Post Acute Care list provided to:: Patient Choice offered to / list presented to : Patient  Expected Discharge Plan and Services Expected Discharge Plan: [place]   Discharge Planning Services: CM Consult Post Acute Care Choice: IP Rehab Living arrangements for the past 2 months: Apartment                                      Prior Living Arrangements/Services Living arrangements for the past 2 months: Apartment Lives with:: Self Patient language and need for interpreter reviewed:: Yes Do you feel safe going back to the place where you live?: Yes      Need for Family Participation in Patient Care: Yes (Comment) Care giver support system in place?: Yes (comment) Current home services: DME Criminal Activity/Legal Involvement Pertinent to Current  Situation/Hospitalization: No - Comment as needed  Activities of Daily Living Home Assistive Devices/Equipment: Walker (specify type) ADL Screening (condition at time of admission) Patient's cognitive ability adequate to safely complete daily activities?: Yes Is the patient deaf or have difficulty hearing?: No Does the patient have difficulty seeing, even when wearing glasses/contacts?: No Does the patient have difficulty concentrating, remembering, or making decisions?: No Patient able to express need for assistance with ADLs?: Yes Does the patient have difficulty dressing or bathing?: No Independently performs ADLs?: Yes (appropriate for developmental age) Does the patient have difficulty walking or climbing stairs?: Yes Weakness of Legs: Right Weakness of Arms/Hands: None  Permission Sought/Granted Permission sought to share information with : Case Manager Permission granted to share information with : Yes, Verbal Permission Granted     Permission granted to share info w AGENCY: CIR or SNF if needed  Permission granted to share info w Relationship: son Jonni Sanger - [phone]     Emotional Assessment Appearance:: Appears stated age Attitude/Demeanor/Rapport: Engaged Affect (typically observed): Accepting Orientation: : Oriented to Self, Oriented to Place, Oriented to  Time, Oriented to Situation Alcohol / Substance Use: Alcohol Use (occasional beer per patient.) Psych Involvement: No (comment)  Admission diagnosis:  Wound infection [T14.8XXA, L08.9] Diabetic infection of right foot ([place]) [P23.300, L08.9] Right foot infection [L08.9] Patient Active Problem List   Diagnosis Date Noted  . Diabetic  infection of right foot ([place]) 06/19/2020  . Chronic osteomyelitis of left foot ([place]) 06/19/2020  . HCAP (healthcare-associated pneumonia)   . Elevated troponin level not due myocardial infarction 03/13/2020  . GERD without esophagitis 03/13/2020  . Anemia 03/13/2020  . Thyroid  goiter 03/13/2020  . Acute respiratory failure with hypoxia ([place]) 03/13/2020  . Type 2 diabetes mellitus with complication, without long-term current use of insulin ([place]) 03/13/2020  . Elevated liver enzymes 03/13/2020  . Acute on chronic congestive heart failure ([place]) 03/13/2020  . Acute cardiogenic pulmonary edema ([facility]) 03/12/2020  . Diabetic ulcer of right foot ([place]) 03/12/2020  . Metatarsalgia of left foot 03/14/2019  . Amputated toe ([place]) 12/06/2018  . Achilles tendon contracture, left 07/24/2016  . Ulcer of left foot, limited to breakdown of skin ([place]) 06/01/2016  . Cellulitis 10/27/2015  . Severe peripheral arterial disease ([place]) 12/28/2014  . Chronic diastolic heart failure ([place]) 12/30/2013  . Edema 12/30/2013  . Mixed hyperlipidemia 06/24/2013  . Mitral valve disorders(424.0) 06/24/2013  . Coronary artery disease involving native coronary artery of native heart   . Essential hypertension   . Diabetes mellitus with peripheral vascular disease ([place])   . Other primary cardiomyopathies    PCP:  Marton Redwood, MD Pharmacy:   [facility] [address] [place] [address] Phone: [phone] Fax: [phone]     Social Determinants of Health (SDOH) Interventions    Readmission Risk Interventions Readmission Risk Prevention Plan 06/23/2020  Transportation Screening Complete  PCP or Specialist Appt within 3-5 Days Complete  HRI or [place] Complete  Social Work Consult for [place] Planning/Counseling Complete  Palliative Care Screening Complete  Medication Review Press photographer) Complete  Some recent data might be hidden

## 2020-06-23 NOTE — Plan of Care (Signed)
  Problem: Education: Goal: Knowledge of General Education information will improve Description Including pain rating scale, medication(s)/side effects and non-pharmacologic comfort measures Outcome: Progressing   

## 2020-06-23 NOTE — Progress Notes (Signed)
PROGRESS NOTE    [name]  IDP:[id number] DOB: 1932-03-26 DOA: 06/18/2020 PCP: Marton Redwood, MD   Brief Narrative:  86 year old with history of peripheral vascular disease, hypothyroidism, HTN, DM2, CAD, diastolic CHF, diabetic foot status post right TMA amputation 8/26 was treated outpatient with wound VAC and multiple courses of oral antibiotic with progression of right foot infection.  Now presents with osteomyelitis and abscess in this area confirmed on MRI.Started on broad-spectrum antibiotics and orthopedic was consulted   Assessment & Plan:   Principal Problem:   Diabetic infection of right foot ([place]) Active Problems:   Coronary artery disease involving native coronary artery of native heart   Essential hypertension   Diabetes mellitus with peripheral vascular disease ([employer])   Mixed hyperlipidemia   Chronic diastolic heart failure (H[facility])  Right diabetic foot infection, with cellulitis, osteomyelitis and abscess(present on admission).  Status post right BKA 12/7 -Wound cultures from 06/17/2020 + for MRSA.  Negative blood cultures -We will stop the antibiotics today. -Pain control, bowel regimen -PT OT pending; up with therapy. Stump protector. NWB RLE.  -Can resume Lovenox today  T2DM/ dyslipidemia. -Accu-Cheks and sliding scale.  Lantus increased to 30 units  HTN Continue blood pressure control withlisinopril and carvedilol.  Chronic diastolic heart failure No clinical signs of acute exacerbation.  Depression Continue withparoxetine.  GERD Continue with pantoprazole.       DVT prophylaxis: enoxaparin (LOVENOX) injection 40 mg Start: 06/23/20 0800 SCDs Start: 06/22/20 1820  Code Status: DNR Family Communication: Son at bedside  Status is: Inpatient  Remains inpatient appropriate because:Inpatient level of care appropriate due to severity of illness   Dispo: The patient is from: Home              Anticipated d/c is to: SNF               Anticipated d/c date is: 2 days              Patient currently is not medically stable to d/c.  Stump protector to be placed by orthopedic service thereafter will need PT, patient will likely require SNF       Body mass index is 25.44 kg/m.        Subjective: Feels okay no complaints.  Awaiting stump protector to be placed on her.  Review of Systems Otherwise negative except as per HPI, including: General: Denies fever, chills, night sweats or unintended weight loss. Resp: Denies cough, wheezing, shortness of breath. Cardiac: Denies chest pain, palpitations, orthopnea, paroxysmal nocturnal dyspnea. GI: Denies abdominal pain, nausea, vomiting, diarrhea or constipation GU: Denies dysuria, frequency, hesitancy or incontinence MS: Denies muscle aches, joint pain or swelling Neuro: Denies headache, neurologic deficits (focal weakness, numbness, tingling), abnormal gait Psych: Denies anxiety, depression, SI/HI/AVH Skin: Denies new rashes or lesions ID: Denies sick contacts, exotic exposures, travel  Examination:  General exam: Appears calm and comfortable  Respiratory system: Clear to auscultation. Respiratory effort normal. Cardiovascular system: S1 & S2 heard, RRR. No JVD, murmurs, rubs, gallops or clicks. No pedal edema. Gastrointestinal system: Abdomen is nondistended, soft and nontender. No organomegaly or masses felt. Normal bowel sounds heard. Central nervous system: Alert and oriented. No focal neurological deficits. Extremities: Symmetric 5 x 5 power. Skin: Right below-knee amputation noted Psychiatry: Judgement and insight appear normal. Mood & affect appropriate.     Objective: Vitals:   06/22/20 2000 06/23/20 0001 06/23/20 0347 06/23/20 0800  BP: 131/66 140/81 (!) 139/56 (!) 165/68  Pulse: 71 66 77  86  Resp: 16 16 16 17   Temp: 98.1 F (36.7 C) 98.4 F (36.9 C) 97.7 F (36.5 C) 98.2 F (36.8 C)  TempSrc: Oral Axillary Oral Oral  SpO2: 91% 92% 94% 95%   Weight:      Height:        Intake/Output Summary (Last 24 hours) at 06/23/2020 0855 Last data filed at 06/23/2020 0500 Gross per 24 hour  Intake 2363 ml  Output 75 ml  Net 2288 ml   Filed Weights   06/18/20 1503 06/19/20 1149  Weight: 75.8 kg 75.9 kg     Data Reviewed:   CBC: Recent Labs  Lab 06/18/20 1513 06/20/20 0227 06/21/20 0447 06/22/20 0514 06/23/20 0221  WBC 14.3* 9.0 8.2 10.7* 12.5*  NEUTROABS 10.2*  --  5.0 7.6 11.3*  HGB 9.8* 8.4* 9.0* 9.0* 9.6*  HCT 31.3* 26.6* 28.1* 29.7* 29.8*  MCV 85.5 84.7 84.6 86.1 84.4  PLT 443* 405* 420* 412* 932*   Basic Metabolic Panel: Recent Labs  Lab 06/18/20 1513 06/20/20 0227 06/21/20 0447 06/23/20 0221  NA 137 138 139 136  K 4.2 3.9 4.0 3.9  CL 100 104 105 104  CO2 25 25 25 23   GLUCOSE 76 189* 169* 290*  BUN 15 24* 23 16  CREATININE 0.68 0.76 0.63 0.64  CALCIUM 9.3 8.8* 9.1 8.3*   GFR: Estimated Creatinine Clearance: 51.9 mL/min (by C-G formula based on SCr of 0.64 mg/dL). Liver Function Tests: Recent Labs  Lab 06/18/20 1513  AST 19  ALT 15  ALKPHOS 66  BILITOT 0.4  PROT 6.8  ALBUMIN 3.1*   No results for input(s): LIPASE, AMYLASE in the last 168 hours. No results for input(s): AMMONIA in the last 168 hours. Coagulation Profile: No results for input(s): INR, PROTIME in the last 168 hours. Cardiac Enzymes: No results for input(s): CKTOTAL, CKMB, CKMBINDEX, TROPONINI in the last 168 hours. BNP (last 3 results) No results for input(s): PROBNP in the last 8760 hours. HbA1C: No results for input(s): HGBA1C in the last 72 hours. CBG: Recent Labs  Lab 06/22/20 1643 06/22/20 1955 06/22/20 2104 06/23/20 0314 06/23/20 0639  GLUCAP 105* 195* 224* 282* 200*   Lipid Profile: No results for input(s): CHOL, HDL, LDLCALC, TRIG, CHOLHDL, LDLDIRECT in the last 72 hours. Thyroid Function Tests: No results for input(s): TSH, T4TOTAL, FREET4, T3FREE, THYROIDAB in the last 72 hours. Anemia Panel: No results  for input(s): VITAMINB12, FOLATE, FERRITIN, TIBC, IRON, RETICCTPCT in the last 72 hours. Sepsis Labs: Recent Labs  Lab 06/18/20 1513  LATICACIDVEN 1.2    Recent Results (from the past 240 hour(s))  Aerobic Culture (superficial specimen)     Status: None   Collection Time: 06/17/20  9:20 AM   Specimen: Wound  Result Value Ref Range Status   Specimen Description   Final    WOUND SITE NOT SPECIFIED Performed at [facility] [address], [place], [place] [address]    Special Requests   Final    NONE Performed at [facility], [place] [address], [place], [place] [address]    Gram Stain NO WBC SEEN NO ORGANISMS SEEN   Final   Culture   Final    RARE METHICILLIN RESISTANT STAPHYLOCOCCUS AUREUS RARE CORYNEBACTERIUM STRIATUM Standardized susceptibility testing for this organism is not available. Performed at [facility], [place] [address], [place], [place] [address]    Report Status 06/22/2020 FINAL  Final   Organism ID, Bacteria METHICILLIN RESISTANT STAPHYLOCOCCUS AUREUS  Final      Susceptibility   Methicillin resistant staphylococcus aureus - MIC*    CIPROFLOXACIN <=0.5 SENSITIVE Sensitive     ERYTHROMYCIN <=0.25 SENSITIVE Sensitive     GENTAMICIN <=0.5 SENSITIVE Sensitive     OXACILLIN RESISTANT Resistant     TETRACYCLINE >=16 RESISTANT Resistant     VANCOMYCIN 1 SENSITIVE Sensitive     TRIMETH/SULFA <=10 SENSITIVE Sensitive     CLINDAMYCIN <=0.25 SENSITIVE Sensitive     RIFAMPIN <=0.5 SENSITIVE Sensitive     Inducible Clindamycin NEGATIVE Sensitive     * RARE METHICILLIN RESISTANT STAPHYLOCOCCUS AUREUS  Resp Panel by RT-PCR (Flu A&B, Covid) Nasopharyngeal Swab     Status: None   Collection Time: 06/19/20  6:45 AM   Specimen: Nasopharyngeal Swab; Nasopharyngeal(NP) swabs in vial transport medium  Result Value Ref Range Status   SARS Coronavirus 2 by RT PCR NEGATIVE NEGATIVE Final    Comment: (NOTE) SARS-CoV-2 target nucleic  acids are NOT DETECTED.  The SARS-CoV-2 RNA is generally detectable in upper respiratory specimens during the acute phase of infection. The lowest concentration of SARS-CoV-2 viral copies this assay can detect is 138 copies/mL. A negative result does not preclude SARS-Cov-2 infection and should not be used as the sole basis for treatment or other patient management decisions. A negative result may occur with  improper specimen collection/handling, submission of specimen other than nasopharyngeal swab, presence of viral mutation(s) within the areas targeted by this assay, and inadequate number of viral copies(<138 copies/mL). A negative result must be combined with clinical observations, patient history, and epidemiological information. The expected result is Negative.  Fact Sheet for Patients:  EntrepreneurPulse.com.au  Fact Sheet for Healthcare Providers:  IncredibleEmployment.be  This test is no t yet approved or cleared by the [place] FDA and  has been authorized for detection and/or diagnosis of SARS-CoV-2 by FDA under an Emergency Use Authorization (EUA). This EUA will remain  in effect (meaning this test can be used) for the duration of the COVID-19 declaration under Section 564(b)(1) of the Act, 21 U.S.C.section 360bbb-3(b)(1), unless the authorization is terminated  or revoked sooner.       Influenza A by PCR NEGATIVE NEGATIVE Final   Influenza B by PCR NEGATIVE NEGATIVE Final    Comment: (NOTE) The Xpert Xpress SARS-CoV-2/FLU/RSV plus assay is intended as an aid in the diagnosis of influenza from Nasopharyngeal swab specimens and should not be used as a sole basis for treatment. Nasal washings and aspirates are unacceptable for Xpert Xpress SARS-CoV-2/FLU/RSV testing.  Fact Sheet for Patients: EntrepreneurPulse.com.au  Fact Sheet for Healthcare Providers: IncredibleEmployment.be  This  test is not yet approved or cleared by the [place] FDA and has been authorized for detection and/or diagnosis of SARS-CoV-2 by FDA under an Emergency Use Authorization (EUA). This EUA will remain in effect (meaning this test can be used) for the duration of the COVID-19 declaration under Section 564(b)(1) of the Act, 21 U.S.C. section 360bbb-3(b)(1), unless the authorization is terminated or revoked.  Performed at [facility], [place] [address], [place], [place] [address]   Culture, blood (routine x 2)     Status: None (Preliminary result)   Collection Time: 06/19/20  7:35 AM   Specimen: BLOOD  Result Value Ref Range Status   Specimen Description BLOOD SITE NOT SPECIFIED  Final   Special Requests   Final    BOTTLES DRAWN AEROBIC AND ANAEROBIC Blood Culture results may not be optimal due to an excessive volume of  blood received in culture bottles   Culture   Final    NO GROWTH 4 DAYS Performed at [facility], [place] [address] [place], [place] [address]    Report Status PENDING  Incomplete  Culture, blood (routine x 2)     Status: None (Preliminary result)   Collection Time: 06/19/20  7:40 AM   Specimen: BLOOD  Result Value Ref Range Status   Specimen Description BLOOD SITE NOT SPECIFIED  Final   Special Requests   Final    BOTTLES DRAWN AEROBIC AND ANAEROBIC Blood Culture adequate volume   Culture   Final    NO GROWTH 4 DAYS Performed at [facility], [address], [place], [place] [address]    Report Status PENDING  Incomplete  Surgical pcr screen     Status: Abnormal   Collection Time: 06/22/20 12:14 AM   Specimen: Nasal Mucosa; Nasal Swab  Result Value Ref Range Status   MRSA, PCR NEGATIVE NEGATIVE Final   Staphylococcus aureus POSITIVE (A) NEGATIVE Final    Comment: CRITICAL RESULT CALLED TO, READ BACK BY AND VERIFIED WITH: RN Tinnie Gens [phone] @[id number]  THANEY Performed at [facility], [place] [address], [place], [place] [address]           Radiology Studies: No results found.      Scheduled Meds: . (feeding supplement) PROSource Plus  30 mL Oral BID BM  . atorvastatin  20 mg Oral QPM  . bismuth subsalicylate  30 mL Oral TID AC & HS  . carvedilol  25 mg Oral BID WC  . Chlorhexidine Gluconate Cloth  6 each Topical Daily  . docusate sodium  100 mg Oral BID  . enoxaparin (LOVENOX) injection  40 mg Subcutaneous Q24H  . insulin aspart  0-15 Units Subcutaneous TID WC  . insulin aspart  0-5 Units Subcutaneous QHS  . insulin aspart  4 Units Subcutaneous TID WC  . insulin glargine  20 Units Subcutaneous QHS  . lisinopril  10 mg Oral QPM  . multivitamin with minerals  1 tablet Oral Daily  . mupirocin ointment  1 application Nasal BID  . nutrition supplement (JUVEN)  1 packet Oral BID BM  . pantoprazole  40 mg Oral Daily  . PARoxetine  40 mg Oral QHS  . saccharomyces boulardii  250 mg Oral BID  . senna  1 tablet Oral BID  . sodium chloride flush  3 mL Intravenous Q12H   Continuous Infusions: . sodium chloride 75 mL/hr at 06/22/20 1840  . cefTRIAXone (ROCEPHIN)  IV 2 g (06/22/20 1349)  . methocarbamol (ROBAXIN) IV    . vancomycin 750 mg (06/22/20 2342)     LOS: 4 days   Time spent= 35 mins    Brailon Don Arsenio Loader, MD [facility]  If 7PM-7AM, please contact night-coverage  06/23/2020, 8:55 AM

## 2020-06-23 NOTE — Progress Notes (Signed)
Subjective: 1 Day Post-Op Procedure(s) (LRB): AMPUTATION BELOW KNEE (Right)  Patient reports pain as mild to moderate.  Denies fever, chills, N/V, CP, SOB.  Tolerating POs well.  Resting comfortably in bed.  Notes that she has not obtained stump protector from [facility].  Objective:   VITALS:  Temp:  [97.4 F (36.3 C)-98.4 F (36.9 C)] 97.7 F (36.5 C) (12/08 0347) Pulse Rate:  [59-77] 77 (12/08 0347) Resp:  [10-19] 16 (12/08 0347) BP: (123-162)/(54-81) 139/56 (12/08 0347) SpO2:  [90 %-100 %] 94 % (12/08 0347)  General: WDWN patient in NAD. Psych:  Appropriate mood and affect. Neuro:  A&O x 3, Moving all extremities, sensation intact to light touch HEENT:  EOMs intact Chest:  Even non-labored respirations Skin: Dressing C/D/I, no rashes or lesions Extremities: warm/dry, no visible edema, erythema or echymosis.  No lymphadenopathy. Pulses: Femoral 2+ MSK:  ROM: TKE, MMT: able to perform quad set    LABS Recent Labs    06/21/20 0447 06/21/20 0447 06/22/20 0514 06/23/20 0221  HGB 9.0*  --  9.0* 9.6*  WBC 8.2   < > 10.7* 12.5*  PLT 420*   < > 412* 430*   < > = values in this interval not displayed.   Recent Labs    06/21/20 0447 06/23/20 0221  NA 139 136  K 4.0 3.9  CL 105 104  CO2 25 23  BUN 23 16  CREATININE 0.63 0.64  GLUCOSE 169* 290*   No results for input(s): LABPT, INR in the last 72 hours.   Assessment/Plan: 1 Day Post-Op Procedure(s) (LRB): AMPUTATION BELOW KNEE (Right)  NWB R LE Reinforce dressing prn Up with therapy Order for stump protector has been placed.   Stump protector on at all times, once obtained. Plan for 2 week outpatient post-op visit. Disp:  Likely SNF   Shirley Friar [facility] [place] Office:  [phone]

## 2020-06-23 NOTE — Evaluation (Signed)
Occupational Therapy Evaluation Patient Details Name: [name] MRN: [id number] DOB: 01/04/1932 Today's Date: 06/23/2020    History of Present Illness [name] is a 86 y.o. female with medical history significant of PVD; hypothyroidism; HTN; DM; CAD; and chronic combined CHF presenting with a foot infection. She had a R TMA on 8/26 that was not healing well. Did not improve with antibiotics so pt underwent R BKA on 12/7.   Clinical Impression   PTA, pt lives alone and reports Modified Independence with ADLs, simple IADLs in the home and mobility using RW. Pt presents now s/p BKA with deficits in balance, strength, endurance, and pain. Pt overall Supervision for bed mobility, Min A for sit to stand transfers with RW. However, pt with difficulty safely sequencing pivot with RW requiring Max A to advance RW and maintain balance. Improved stability noted with squat pivot transfer back to bed at Mod A. Pt overall Setup for UB ADLs and Mod A for LB ADLs using lateral leans. Pt will require Max A for LB ADLs in standing secondary to balance deficits. Pt engaged in session throughout, motivated to return to independence and reports having family/friend support nearby. Recommend CIR for comprehensive and intensive therapies to assist pt in maximizing independence and decreasing fall risk.     Follow Up Recommendations  CIR;Other (comment) (will need SNF if unable to go to CIR)    Equipment Recommendations  3 in 1 bedside commode;Tub/shower bench;Wheelchair (measurements OT);Wheelchair cushion (measurements OT);Other (comment) (RW)    Recommendations for Other Services Rehab consult     Precautions / Restrictions Precautions Precautions: Fall Required Braces or Orthoses: Other Brace Other Brace: limb protector Restrictions Weight Bearing Restrictions: Yes Other Position/Activity Restrictions: NWB RLE      Mobility Bed Mobility Overal bed mobility: Needs Assistance Bed Mobility: Supine  to Sit;Sit to Supine     Supine to sit: Supervision;HOB elevated Sit to supine: Supervision   General bed mobility comments: Supervision for safety with no assist needed    Transfers Overall transfer level: Needs assistance Equipment used: Rolling walker (2 wheeled);None Transfers: Sit to/from [employer] Transfers Sit to Stand: Min assist Stand pivot transfers: Max assist Squat pivot transfers: Mod assist     General transfer comment: MIn A for sit to stand with RW to assist in stability. Pt with frequent posterior LOB with stand pivot using RW - cues needed for sequencing due to phantom sensations of L LE. Pt able to demo improved safety with squat pivot back to bed from [facility] with manual assist from therapist    Balance Overall balance assessment: Needs assistance Sitting-balance support: No upper extremity supported;Feet supported Sitting balance-Leahy Scale: Fair   Postural control: Posterior lean Standing balance support: Bilateral upper extremity supported;During functional activity Standing balance-Leahy Scale: Poor Standing balance comment: reliant on UE support due to R BKA                           ADL either performed or assessed with clinical judgement   ADL Overall ADL's : Needs assistance/impaired Eating/Feeding: Independent;Sitting   Grooming: Set up;Sitting   Upper Body Bathing: Set up;Sitting   Lower Body Bathing: Moderate assistance;Sitting/lateral leans   Upper Body Dressing : Set up;Sitting   Lower Body Dressing: Moderate assistance;Sitting/lateral leans Lower Body Dressing Details (indicate cue type and reason): Able to reach L foot to adjust sock. Will require increased assistance in standing. Mod A for lateral leans,  Max A for sit to stands Toilet Transfer: Moderate assistance;Squat-pivot;BSC Toilet Transfer Details (indicate cue type and reason): Trialed with RW/stand pivot and squat pivot with HHA. Poor  stability and high fall risk with RW pivot, increased safety and manuevering with squat pivot Toileting- Clothing Manipulation and Hygiene: Moderate assistance;Sitting/lateral lean Toileting - Clothing Manipulation Details (indicate cue type and reason): Increased assist if in standing. Able to squat pivot back to bed and perform lateral lean peri care. needed assistance for clothing mgmt        General ADL Comments: Pt presents as high fall risk with balance deficits in standing with RW for ADLs. Increased safety with compensatory strategies using lateral leans     Vision Patient Visual Report: No change from baseline Vision Assessment?: No apparent visual deficits     Perception     Praxis      Pertinent Vitals/Pain Pain Assessment: Faces Faces Pain Scale: Hurts a little bit Pain Location: R residual limb Pain Descriptors / Indicators: Grimacing;Guarding Pain Intervention(s): Monitored during session;Repositioned     Hand Dominance Right   Extremity/Trunk Assessment Upper Extremity Assessment Upper Extremity Assessment: Generalized weakness   Lower Extremity Assessment Lower Extremity Assessment: Defer to PT evaluation   Cervical / Trunk Assessment Cervical / Trunk Assessment: Normal   Communication Communication Communication: No difficulties   Cognition Arousal/Alertness: Awake/alert Behavior During Therapy: WFL for tasks assessed/performed Overall Cognitive Status: Within Functional Limits for tasks assessed                                     General Comments  Extended time spent problem solving routine, safety with ADLs, timeline for healing and obtaining prosthetic    Exercises     Shoulder Instructions      Home Living Family/patient expects to be discharged to:: Private residence Living Arrangements: Alone Available Help at Discharge: Family;Personal care attendant;Friend(s);Available PRN/intermittently Type of Home: Apartment Home  Access: Level entry     Home Layout: One level     Bathroom Shower/Tub: Teacher, early years/pre: Standard Bathroom Accessibility: Yes   Home Equipment: Grab bars - tub/shower;Shower seat;Walker - 4 wheels   Additional Comments: Reports she has family that lives nearby, friends that offer assistance as needed       Prior Functioning/Environment Level of Independence: Needs assistance  Gait / Transfers Assistance Needed: Engineer, manufacturing systems for mobility  ADL's / Homemaking Assistance Needed: Able to complete ADLs (sponge bathing more often than showering). Able to complete some IADLs in the home, but has a PCA that assist with cleaning 2 days a week for a couple of hours            OT Problem List: Decreased strength;Decreased activity tolerance;Impaired balance (sitting and/or standing);Decreased knowledge of use of DME or AE;Decreased knowledge of precautions;Pain      OT Treatment/Interventions: Self-care/ADL training;Therapeutic exercise;DME and/or AE instruction;Therapeutic activities;Patient/family education    OT Goals(Current goals can be found in the care plan section) Acute Rehab OT Goals Patient Stated Goal: be able to regain independence, be able to get prosthetic and walk normally again OT Goal Formulation: With patient Time For Goal Achievement: 07/07/20 Potential to Achieve Goals: Good ADL Goals Pt Will Perform Lower Body Bathing: with set-up;sitting/lateral leans;sit to/from stand Pt Will Perform Lower Body Dressing: with set-up;sitting/lateral leans Pt Will Transfer to Toilet: with supervision;stand pivot transfer;bedside commode Pt Will Perform Toileting - Clothing Manipulation  and hygiene: with set-up;sit to/from stand;sitting/lateral leans Pt Will Perform Tub/Shower Transfer: Tub transfer;with set-up;Stand pivot transfer;tub bench;rolling walker Pt/caregiver will Perform Home Exercise Program: Increased strength;Both right and left upper extremity;With  theraband;Independently;With written HEP provided  OT Frequency: Min 2X/week   Barriers to D/C:            Co-evaluation              AM-PAC OT "6 Clicks" Daily Activity     Outcome Measure Help from another person eating meals?: None Help from another person taking care of personal grooming?: A Little Help from another person toileting, which includes using toliet, bedpan, or urinal?: A Lot Help from another person bathing (including washing, rinsing, drying)?: A Lot Help from another person to put on and taking off regular upper body clothing?: A Little Help from another person to put on and taking off regular lower body clothing?: A Lot 6 Click Score: 16   End of Session Equipment Utilized During Treatment: Gait belt;Rolling walker Nurse Communication: Mobility status;Weight bearing status  Activity Tolerance: Patient tolerated treatment well Patient left: in bed;with call bell/phone within reach;with bed alarm set;Other (comment) (sitting EOB with PT)  OT Visit Diagnosis: Unsteadiness on feet (R26.81);Other abnormalities of gait and mobility (R26.89);Muscle weakness (generalized) (M62.81);Pain Pain - Right/Left: Right Pain - part of body: Leg                Time: 2060-1561 OT Time Calculation (min): 41 min Charges:  OT General Charges $OT Visit: 1 Visit OT Evaluation $OT Eval Moderate Complexity: 1 Mod OT Treatments $Self Care/Home Management : 8-22 mins  Layla Maw, OTR/L  Layla Maw 06/23/2020, 1:39 PM

## 2020-06-23 NOTE — Progress Notes (Signed)
Nutrition Follow-up  DOCUMENTATION CODES:   Not applicable  INTERVENTION:   -Continue 30 ml Prosource Plus to BID, each supplement provides 100 kcals and 15 grams protein -Continue 1 packet Juven BID, each packet provides 95 calories, 2.5 grams of protein (collagen), and 9.8 grams of carbohydrate (3 grams sugar); also contains 7 grams of L-arginine and L-glutamine, 300 mg vitamin C, 15 mg vitamin E, 1.2 mcg vitamin B-12, 9.5 mg zinc, 200 mg calcium, and 1.5 g  Calcium Beta-hydroxy-Beta-methylbutyrate to support wound healing -Continue MVI with minerals daily  NUTRITION DIAGNOSIS:   Increased nutrient needs related to wound healing as evidenced by estimated needs.  Ongoing  GOAL:   Patient will meet greater than or equal to 90% of their needs  Progressing   MONITOR:   PO intake, Supplement acceptance, Labs, Weight trends  REASON FOR ASSESSMENT:   Consult Assessment of nutrition requirement/status  ASSESSMENT:   86 y.o. female with medical history of DM, PVD requiring R midfoot amputation in 02/2020, CAD, HTN, hypothyroidism, anxiety, GERD, and CHF. She presented to the ED due to R foot infection. She had been on wound vac until 1 week ago and was started on doxy 11/29. Patient reported intermittent shooting-type pain.  12/7- s/p Procedure(s):  Right below knee amputation  Reviewed I/O's: +2.3 L x 24 hours and +5.2 L since admission  Attempted to speak with pt via call to hospital room phone, however, unable to reach.   Pt with good appetite pre-operatively; noted meal completion 75-100%. No meal completions documented since rt BKA. Pt continues to take Prosource Plus and Juven supplements.   Medications reviewed and include pepto bismol, colace,and florastor.   Labs reviewed: CBGS: [phone] (inpatient orders for glycemic control are 0-15 units insulin aspart TID with meals, 0-5 units insulin aspart daily at bedtime, 4 units insulin aspart TID with meals, and 30 units  insulin glargine daily at bedtime).   Diet Order:   Diet Order            Diet Carb Modified Fluid consistency: Thin; Room service appropriate? Yes  Diet effective now                 EDUCATION NEEDS:   Education needs have been addressed  Skin:  Skin Assessment: Skin Integrity Issues: Skin Integrity Issues:: Incisions Incisions: s/p rt BKA  Last BM:  06/22/20  Height:   Ht Readings from Last 1 Encounters:  06/19/20 5\' 8"  (1.727 m)    Weight:   Wt Readings from Last 1 Encounters:  06/19/20 75.9 kg   BMI:  Body mass index is 25.44 kg/m.  Estimated Nutritional Needs:   Kcal:  2000-2200  Protein:  105-120 grams  Fluid:  > 2 L    Loistine Chance, RD, LDN, [place] Registered Dietitian II Certified Diabetes Care and Education Specialist Please refer to [facility] for RD and/or RD on-call/weekend/after hours pager

## 2020-06-23 NOTE — Evaluation (Signed)
Physical Therapy Evaluation Patient Details Name: [name] MRN: [id number] DOB: 04-06-1932 Today's Date: 06/23/2020   History of Present Illness  CORRENA MEACHAM is a 86 y.o. female with medical history significant of PVD; hypothyroidism; HTN; DM; CAD; and chronic combined CHF presenting with a foot infection. She had a R TMA on 8/26 that was not healing well. Did not improve with antibiotics so pt underwent R BKA on 12/7.  Clinical Impression   Patient is s/p above surgery resulting in functional limitations due to the deficits listed below (see PT Problem List). Comes from home where she lives alone in single level apartment with a level entryway; Walks with a rollator-type RW at baseline, and has private pay caregiver help a few days a week as well as a local son; Presents to PT with gait and balance dysfunction, secondary to most recent BKA, but also noted to have an episode of vertigo lasting approx 20ish seconds with notable nystagmus when going form supine to R sidelying; Discussed with pt and Noreene Larsson, OT that Ms. Klimas is having difficutly with the sensation of wanting to put her R foot (amputated yesterday) on the floor when she stands, making standing and upright activity difficult and unsafe; This represents quite a functional decline from her baseline; I'm curious if the added observed Vestibular dysfunction and the fact that she is an octogenarian would bolster the case for medical complexity, and help get her to CIR for post-acute rehab; She is motivated, and participates well;  Patient will benefit from skilled PT to increase their independence and safety with mobility to allow discharge to the venue listed below.    I posit that with the intensive therapies provided at [facility], she will be able to acheive Modified independence and be able to safely dc home, likely at wheelchair level initially; I'm curious for Rehab Adm Coord to weigh in.     Follow Up Recommendations CIR    Equipment  Recommendations  Rolling walker with 5" wheels;3in1 (PT);Wheelchair (measurements PT);Wheelchair cushion (measurements PT);Other (comment) (drop arm 3in1; possibly sliding board)    Recommendations for Other Services Other (comment) (vestibular evaluation)     Precautions / Restrictions Precautions Precautions: Fall Required Braces or Orthoses: Other Brace Other Brace: limb protector Restrictions Other Position/Activity Restrictions: NWB RLE      Mobility  Bed Mobility Overal bed mobility: Needs Assistance Bed Mobility: Supine to Sit;Sit to Supine;Rolling Rolling: Supervision   Supine to sit: Supervision;HOB elevated Sit to supine: Supervision   General bed mobility comments: Supervision for safety with no assist needed; used bed rails to pull to long sit; and passes through supine on elbows to get seated to supine; Portion of the session devoted to therex, and pt was able to roll supine to prone (making some adjustments) towards the R; while rolling ot the Right, she reported vertigo, and noted nystagmus    Transfers Overall transfer level: Needs assistance   Transfers: Lateral/Scoot Transfers          Lateral/Scoot Transfers: Min guard General transfer comment: Simulated lateral scoot transfers at EOB; cues for technique  Ambulation/Gait                Stairs            Wheelchair Mobility    Modified Rankin (Stroke Patients Only)       Balance     Sitting balance-Leahy Scale: Good  Pertinent Vitals/Pain Pain Assessment: Faces Faces Pain Scale: Hurts a little bit Pain Location: R residual limb Pain Descriptors / Indicators: Grimacing;Guarding Pain Intervention(s): Monitored during session    Home Living Family/patient expects to be discharged to:: Private residence Living Arrangements: Alone Available Help at Discharge: Family;Personal care attendant;Friend(s);Available  PRN/intermittently Type of Home: Apartment Home Access: Level entry     Home Layout: One level Home Equipment: Grab bars - tub/shower;Shower seat;Walker - 4 wheels Additional Comments: Reports she has family that lives nearby, friends that offer assistance as needed     Prior Function Level of Independence: Needs assistance   Gait / Transfers Assistance Needed: Engineer, manufacturing systems for mobility   ADL's / Homemaking Assistance Needed: Able to complete ADLs (sponge bathing more often than showering). Able to complete some IADLs in the home, but has a PCA that assist with cleaning 2 days a week for a couple of hours        Hand Dominance   Dominant Hand: Right    Extremity/Trunk Assessment   Upper Extremity Assessment Upper Extremity Assessment: Defer to OT evaluation    Lower Extremity Assessment Lower Extremity Assessment: RLE deficits/detail RLE Deficits / Details: S/p BKA; Reports phantom sensations and occasional pain; discussed desinsitization technqiues; Able to achieve active R knee extension in sitting; Performed R seated hamstring stretches and prone R hip extension       Communication   Communication: No difficulties  Cognition Arousal/Alertness: Awake/alert Behavior During Therapy: WFL for tasks assessed/performed Overall Cognitive Status: Within Functional Limits for tasks assessed                                        General Comments General comments (skin integrity, edema, etc.): Extended time discussing therex to prep for prosthesis training with a focus on hip and knee extension strengthening and hip and knee flexor stretching; Educated pt in possible pathology in Vestibular system causing her bouts with vertigo -- will request a vestib PT to see her next    Exercises Other Exercises Other Exercises: Seated hamstring stretch with 10 sec hold x3 Other Exercises: prone hip extension alternating R and L x10   Assessment/Plan    PT Assessment  Patient needs continued PT services  PT Problem List Decreased strength;Decreased range of motion;Decreased activity tolerance;Decreased balance;Decreased mobility;Decreased coordination;Decreased knowledge of use of DME;Decreased knowledge of precautions;Impaired sensation;Pain;Decreased skin integrity       PT Treatment Interventions DME instruction;Gait training;Stair training;Functional mobility training;Therapeutic activities;Therapeutic exercise;Balance training;Neuromuscular re-education;Patient/family education;Other (comment) (vestibualr interventions)    PT Goals (Current goals can be found in the Care Plan section)  Acute Rehab PT Goals Patient Stated Goal: be able to regain independence, be able to get prosthetic and walk normally again PT Goal Formulation: With patient Time For Goal Achievement: 07/07/20 Potential to Achieve Goals: Good Additional Goals Additional Goal #1: Pt will manage parts and propel wheelchair greater than 1000 feet including turns and backwards with S    Frequency Min 4X/week   Barriers to discharge Decreased caregiver support;Other (comment) However, I posit that with the intensive therapies provided at [facility], she will be able to acheive Modified independence and be able to safely dc home, likely at wheelchair level initially    Co-evaluation PT/OT/SLP Co-Evaluation/Treatment: Yes (slight dovetail) Reason for Co-Treatment: For patient/therapist safety PT goals addressed during session: Mobility/safety with mobility         AM-PAC PT "  6 Clicks" Mobility  Outcome Measure Help needed turning from your back to your side while in a flat bed without using bedrails?: A Little Help needed moving from lying on your back to sitting on the side of a flat bed without using bedrails?: None Help needed moving to and from a bed to a chair (including a wheelchair)?: A Little Help needed standing up from a chair using your arms (e.g., wheelchair or bedside  chair)?: A Lot Help needed to walk in hospital room?: A Lot Help needed climbing 3-5 steps with a railing? : Total 6 Click Score: 15    End of Session   Activity Tolerance: Patient tolerated treatment well Patient left: in bed;with call bell/phone within reach;with bed alarm set Nurse Communication: Mobility status PT Visit Diagnosis: Unsteadiness on feet (R26.81);Other abnormalities of gait and mobility (R26.89);Dizziness and giddiness (R42) (possible BPPV)    Time: 8335-8251 PT Time Calculation (min) (ACUTE ONLY): 34 min   Charges:   PT Evaluation $PT Eval Moderate Complexity: 1 Mod PT Treatments $Therapeutic Exercise: 8-22 mins        Roney Marion, PT  Acute Rehabilitation Services Pager [phone] Office [place] 06/23/2020, 4:44 PM

## 2020-06-24 LAB — CULTURE, BLOOD (ROUTINE X 2)
Culture: NO GROWTH
Culture: NO GROWTH
Special Requests: ADEQUATE

## 2020-06-24 LAB — MAGNESIUM: Magnesium: 1.8 mg/dL (ref 1.7–2.4)

## 2020-06-24 LAB — GLUCOSE, CAPILLARY
Glucose-Capillary: 143 mg/dL — ABNORMAL HIGH (ref 70–99)
Glucose-Capillary: 145 mg/dL — ABNORMAL HIGH (ref 70–99)
Glucose-Capillary: 177 mg/dL — ABNORMAL HIGH (ref 70–99)
Glucose-Capillary: 205 mg/dL — ABNORMAL HIGH (ref 70–99)

## 2020-06-24 LAB — BASIC METABOLIC PANEL
Anion gap: 7 (ref 5–15)
BUN: 25 mg/dL — ABNORMAL HIGH (ref 8–23)
CO2: 27 mmol/L (ref 22–32)
Calcium: 8.7 mg/dL — ABNORMAL LOW (ref 8.9–10.3)
Chloride: 107 mmol/L (ref 98–111)
Creatinine, Ser: 0.76 mg/dL (ref 0.44–1.00)
GFR, Estimated: >60 mL/min (ref >60)
Glucose, Bld: 148 mg/dL — ABNORMAL HIGH (ref 70–99)
Potassium: 4.2 mmol/L (ref 3.5–5.1)
Sodium: 141 mmol/L (ref 135–145)

## 2020-06-24 MED ORDER — SENNA 8.6 MG PO TABS: 1.0000 | ORAL_TABLET | Freq: Every day | ORAL | 0 refills | Status: DC | PRN

## 2020-06-24 MED ORDER — METHOCARBAMOL 500 MG PO TABS
500.0000 mg | ORAL_TABLET | Freq: Four times a day (QID) | ORAL | Status: DC | PRN
Start: 2020-06-24 — End: 2020-06-28

## 2020-06-24 MED ORDER — OXYCODONE HCL 5 MG PO TABS
5.0000 mg | ORAL_TABLET | ORAL | 0 refills | Status: DC | PRN
Start: 2020-06-24 — End: 2020-06-28

## 2020-06-24 MED ORDER — DOCUSATE SODIUM 100 MG PO CAPS: 100.0000 mg | ORAL_CAPSULE | Freq: Two times a day (BID) | ORAL | Status: DC | PRN

## 2020-06-24 MED ORDER — DOCUSATE SODIUM 100 MG PO CAPS: 100.0000 mg | ORAL_CAPSULE | Freq: Two times a day (BID) | ORAL | 0 refills | Status: DC | PRN

## 2020-06-24 MED ORDER — LISINOPRIL 10 MG PO TABS
10.0000 mg | ORAL_TABLET | Freq: Every evening | ORAL | Status: DC
Start: 2020-06-24 — End: 2020-06-25

## 2020-06-24 NOTE — Progress Notes (Signed)
Subjective: 2 Days Post-Op Procedure(s) (LRB): AMPUTATION BELOW KNEE (Right) Patient reports pain as mild.  Controlled with tylenol for the most part.  Occasionally needing oxy.  Tolerating regular diet.  Having a hard time hopping with PT.  Awaiting word on placement at CIR.  Objective: Vital signs in last 24 hours: Temp:  [97.8 F (36.6 C)-98.1 F (36.7 C)] 97.8 F (36.6 C) (12/09 1451) Pulse Rate:  [60-91] 62 (12/09 1451) Resp:  [15-18] 16 (12/09 1451) BP: (132-174)/(51-70) 132/51 (12/09 1451) SpO2:  [92 %-99 %] 96 % (12/09 1451)  Intake/Output from previous day: 12/08 0701 - 12/09 0700 In: 480 [P.O.:480] Out: -  Intake/Output this shift: No intake/output data recorded.  Recent Labs    06/22/20 0514 06/23/20 0221  HGB 9.0* 9.6*   Recent Labs    06/22/20 0514 06/23/20 0221  WBC 10.7* 12.5*  RBC 3.45* 3.53*  HCT 29.7* 29.8*  PLT 412* 430*   Recent Labs    06/23/20 0221 06/24/20 0317  NA 136 141  K 3.9 4.2  CL 104 107  CO2 23 27  BUN 16 25*  CREATININE 0.64 0.76  GLUCOSE 290* 148*  CALCIUM 8.3* 8.7*   No results for input(s): LABPT, INR in the last 72 hours.  WN WD female in nad.  R LE immobilzed in stump protector.  Wound dressed and dry.   Assessment/Plan: 2 Days Post-Op Procedure(s) (LRB): AMPUTATION BELOW KNEE (Right) Up with therapy, NWB on the R LE.  Stump protector on at all times except for ROm with PT.  Concur with CIR placement.    Wylene Simmer 06/24/2020, 3:33 PM

## 2020-06-24 NOTE — Care Management Important Message (Signed)
Important Message  Patient Details  Name: [name] MRN: [id number] Date of Birth: 1932-03-14   Medicare Important Message Given:  Yes     [name] 06/24/2020, 2:14 PM

## 2020-06-24 NOTE — Progress Notes (Signed)
Inpatient Rehabilitation Admissions Coordinator  Inpatient rehab consult received. I met with patient at bedside to discuss goals and expectations of a possible Cir admit. She is a great candidate . I will place order for OT eval and will begin insurance authorization for a possible CIR admit. CIR admit pending insurance approval and bed availability. I have alerted Dr. Reesa Chew of plan.  Danne Baxter, RN, MSN Rehab Admissions Coordinator [phone] 06/24/2020 11:38 AM

## 2020-06-24 NOTE — Progress Notes (Signed)
Physical Therapy Treatment Patient Details Name: [name] MRN: [id number] DOB: 09-08-1931 Today's Date: 06/24/2020    History of Present Illness [name] is a 86 y.o. female with medical history significant of PVD; hypothyroidism; HTN; DM; CAD; and chronic combined CHF presenting with a foot infection. She had a R TMA on 8/26 that was not healing well. Did not improve with antibiotics so pt underwent R BKA on 12/7.    PT Comments    Pt tolerates treatment well with multiple transfer attempts completed this session. Pt does continue to remain unsteady and requires significant assistance to complete transfers due to imbalance when pivoting. PT also performs vestibular assessment with R beating nystagmus noted with pursuits and dix-hallpike toward left. Pt symptoms relieved some with epley maneuver. Pt will continue to benefit from PT POC to improve transfer quality and to reduce dizziness and falls risk.   Follow Up Recommendations  CIR     Equipment Recommendations  Rolling walker with 5" wheels;3in1 (PT);Wheelchair (measurements PT);Wheelchair cushion (measurements PT);Other (comment) (sliding board)    Recommendations for Other Services       Precautions / Restrictions Precautions Precautions: Fall Required Braces or Orthoses: Other Brace Other Brace: limb protector Restrictions Weight Bearing Restrictions: Yes Other Position/Activity Restrictions: NWB RLE    Mobility  Bed Mobility Overal bed mobility: Needs Assistance Bed Mobility: Rolling;Sidelying to Sit Rolling: Supervision Sidelying to sit: Min guard       General bed mobility comments: bed mobility performed during epley maneuver  Transfers Overall transfer level: Needs assistance Equipment used: Rolling walker (2 wheeled) Transfers: Sit to/from [facility] Sit to Stand: Mod assist Stand pivot transfers: Max assist       General transfer comment: pt with impaired balance when  attempting to pivot. Pt is able to shuffle foot some during session. Pt does become incontinent of stool during SPT to [facility]. Pt performs 4 sit to stand and 3 SPT during session  Ambulation/Gait                 Stairs             Wheelchair Mobility    Modified Rankin (Stroke Patients Only)       Balance Overall balance assessment: Needs assistance Sitting-balance support: No upper extremity supported;Feet supported Sitting balance-Leahy Scale: Good     Standing balance support: Bilateral upper extremity supported Standing balance-Leahy Scale: Poor Standing balance comment: reliant on UE support of RW and minA                            Cognition Arousal/Alertness: Awake/alert Behavior During Therapy: Anxious Overall Cognitive Status: Within Functional Limits for tasks assessed                                        Exercises      General Comments General comments (skin integrity, edema, etc.): VSS on RA. PT performs vestibular assessment: pursuits to left positive for R beating nystagmus, dix-hallpike to left positive for R beating nystagmus and dizziness. Epley maneuver to left does reduce dizziness per pt report. VOR and saccades negative. Dix hallpike R negative.      Pertinent Vitals/Pain Pain Assessment: No/denies pain    Home Living  Prior Function            PT Goals (current goals can now be found in the care plan section) Acute Rehab PT Goals Patient Stated Goal: be able to regain independence, be able to get prosthetic and walk normally again Progress towards PT goals: Progressing toward goals    Frequency    Min 4X/week      PT Plan Current plan remains appropriate    Co-evaluation              AM-PAC PT "6 Clicks" Mobility   Outcome Measure  Help needed turning from your back to your side while in a flat bed without using bedrails?: A Little Help needed moving  from lying on your back to sitting on the side of a flat bed without using bedrails?: A Little Help needed moving to and from a bed to a chair (including a wheelchair)?: A Lot Help needed standing up from a chair using your arms (e.g., wheelchair or bedside chair)?: A Lot Help needed to walk in hospital room?: A Lot Help needed climbing 3-5 steps with a railing? : Total 6 Click Score: 13    End of Session   Activity Tolerance: Patient tolerated treatment well Patient left: in chair;with call bell/phone within reach;with chair alarm set Nurse Communication: Mobility status PT Visit Diagnosis: Unsteadiness on feet (R26.81);Other abnormalities of gait and mobility (R26.89);Dizziness and giddiness (R42)     Time: 8833-7445 PT Time Calculation (min) (ACUTE ONLY): 58 min  Charges:  $Therapeutic Activity: 38-52 mins $[place] Rep Proc: 8-22 mins                     Zenaida Niece, PT, DPT Acute Rehabilitation Pager: [phone]    DEZI BRAUNER 06/24/2020, 11:32 AM

## 2020-06-24 NOTE — Plan of Care (Signed)

## 2020-06-24 NOTE — Discharge Summary (Addendum)
Physician Discharge Summary  [name]:[id number] DOB: [birth year] DOA: 06/18/2020  PCP: Marton Redwood, MD  Admit date: 06/18/2020 Discharge date: 06/28/2020  Admitted From: Home Disposition: SNF  Recommendations for Outpatient Follow-up:  1. Follow up with PCP in 1-2 weeks 2. Please obtain BMP/CBC in one week your next doctors visit.  3. Follow-up outpatient orthopedic in 2 weeks 4. Nonweightbearing right lower extremity, reinforce dressing as needed, with therapy, stump protector in place at all times 5. Bowel regimen as needed to have 1 soft bowel movement daily  Discharge Condition: Stable CODE STATUS: DNR Diet recommendation: Diabetic  Brief/Interim Summary: 86 year old with history of peripheral vascular disease, hypothyroidism, HTN, DM2, CAD, diastolic CHF, diabetic foot status post right TMA amputation 8/26 was treated outpatient with wound VAC and multiple courses of oral antibiotic with progression of right foot infection.  Now presents with osteomyelitis and abscess in this area confirmed on MRI.Started on broad-spectrum antibiotics and orthopedic was consulted but after her BKA on 12/7 antibiotics were discontinued she did well.  Stump protector was placed.  PT recommended CIR therefore arrangements made.   Assessment & Plan:   Principal Problem:   Diabetic infection of right foot ([place]) Active Problems:   Coronary artery disease involving native coronary artery of native heart   Essential hypertension   Diabetes mellitus with peripheral vascular disease (HCC)   Mixed hyperlipidemia   Chronic diastolic heart failure (HCC)  Right diabetic foot infection, with cellulitis, osteomyelitis and abscess(present on admission).  Status post right BKA 12/7 -Wound cultures from 06/17/2020 + for MRSA.  Negative blood cultures -We will stop the antibiotics today. -Pain control, bowel regimen -PT OT recommending CIR.  Rest of recommendations as listed above.  Follow-up with  outpatient orthopedic in 2 weeks  T2DM/ dyslipidemia. -Accu-Cheks and sliding scale.  Long-acting Lantus  HTN Continue blood pressure control withlisinopriland carvedilol.  Lisinopril reduced to 10 mg daily  Chronic diastolic heart failure Noclinical signs of acuteexacerbation.  Depression Continue withparoxetine.  GERD Continue with pantoprazole.   Body mass index is 25.44 kg/m.         Discharge Diagnoses:  Principal Problem:   Diabetic infection of right foot ([place]) Active Problems:   Coronary artery disease involving native coronary artery of native heart   Essential hypertension   Diabetes mellitus with peripheral vascular disease ([place])   Mixed hyperlipidemia   Chronic diastolic heart failure (HCC)    Subjective: Feels okay no complaints overall but tells me she has been having runny bowel movement after taking Colace.  Discharge Exam: Vitals:   06/28/20 0809 06/28/20 1443  BP: (!) 194/66 (!) 142/57  Pulse: 65 70  Resp: 17 17  Temp: 97.8 F (36.6 C) 98.2 F (36.8 C)  SpO2: 100% 96%   Vitals:   06/27/20 1941 06/28/20 0454 06/28/20 0809 06/28/20 1443  BP: (!) 131/56 (!) 183/70 (!) 194/66 (!) 142/57  Pulse: 62 69 65 70  Resp: 15 16 17 17   Temp: 98.8 F (37.1 C) 98 F (36.7 C) 97.8 F (36.6 C) 98.2 F (36.8 C)  TempSrc:  Oral Oral Oral  SpO2: 97% 97% 100% 96%  Weight:      Height:        General: Pt is alert, awake, not in acute distress Cardiovascular: RRR, S1/S2 +, no rubs, no gallops Respiratory: CTA bilaterally, no wheezing, no rhonchi Abdominal: Soft, NT, ND, bowel sounds + Extremities: no edema, no cyanosis  Right-sided BKA with stump protector in place  Discharge Instructions  Allergies as of 06/28/2020      Reactions   Sulfa Antibiotics Nausea And Vomiting   Severe vomiting.       Medication List    STOP taking these medications   doxycycline 100 MG tablet Commonly known as: VIBRA-TABS     TAKE these  medications   acetaminophen 325 MG tablet Commonly known as: TYLENOL Take 2 tablets (650 mg total) by mouth every 4 (four) hours as needed for headache or mild pain.   aspirin 81 MG EC tablet Take 1 tablet (81 mg total) by mouth daily. Swallow whole. What changed:   how much to take  when to take this   atorvastatin 20 MG tablet Commonly known as: LIPITOR Take 1 tablet (20 mg total) by mouth daily. Please keep upcoming appt in July with Dr. Irish Lack before anymore refills. Thank you What changed:   when to take this  additional instructions   b complex vitamins capsule Take 1 capsule by mouth daily.   CAL-MAG-ZINC PO Take 1 tablet by mouth daily.   carvedilol 25 MG tablet Commonly known as: COREG Take 1 tablet (25 mg total) by mouth 2 (two) times daily with a meal. *Please call and schedule an appointment with Dr Fletcher Anon* What changed:   when to take this  additional instructions   cholecalciferol 1000 units tablet Commonly known as: VITAMIN D Take 2,000 Units by mouth daily.   clopidogrel 75 MG tablet Commonly known as: PLAVIX TAKE 1 TABLET BY MOUTH ONCE DAILY What changed: when to take this   Co Q 10 100 MG Caps Take 300 mg by mouth daily.   docusate sodium 100 MG capsule Commonly known as: COLACE Take 1 capsule (100 mg total) by mouth 2 (two) times daily as needed for mild constipation.   ergocalciferol 1.25 MG (50000 UT) capsule Commonly known as: VITAMIN D2 Take 50,000 Units by mouth every Friday.   furosemide 40 MG tablet Commonly known as: LASIX Take 1 tablet (40 mg total) by mouth daily.   HAIR SKIN & NAILS GUMMIES PO Take 2 tablets by mouth daily. gummy   insulin aspart 100 UNIT/ML injection Commonly known as: novoLOG Inject 10 Units into the skin 3 (three) times daily with meals.   insulin glargine 100 UNIT/ML injection Commonly known as: LANTUS Inject 0.35 mLs (35 Units total) into the skin every evening. What changed:   how much to  take  when to take this   insulin lispro 100 UNIT/ML injection Commonly known as: HUMALOG Inject 10-20 Units into the skin See admin instructions. Take 10 units at breakfast, 20 units at lunch and dinner   iron polysaccharides 150 MG capsule Commonly known as: NIFEREX Take 150 mg by mouth daily.   lisinopril 20 MG tablet Commonly known as: ZESTRIL Take 20 mg by mouth every evening.   nitroGLYCERIN 0.4 MG SL tablet Commonly known as: NITROSTAT PLACE 1 TABLET UNDER THE TONGUE EVERY 5 MINUTES AS NEEDED FOR CHEST PAIN What changed:   how much to take  how to take this  when to take this  reasons to take this   pantoprazole 40 MG tablet Commonly known as: PROTONIX TAKE 1 TABLET BY MOUTH ONCE DAILY AT 6 AM What changed:   how much to take  how to take this  when to take this  additional instructions   PARoxetine 40 MG tablet Commonly known as: PAXIL Take 40 mg by mouth at bedtime.   polyethylene glycol 17 g packet Commonly known  as: MIRALAX / GLYCOLAX Take 17 g by mouth daily as needed for mild constipation.   potassium chloride SA 20 MEQ tablet Commonly known as: KLOR-CON Take 1 tablet (20 mEq total) by mouth daily.   senna 8.6 MG Tabs tablet Commonly known as: SENOKOT Take 1 tablet (8.6 mg total) by mouth daily as needed for mild constipation.       Follow-up Information    Wylene Simmer, MD. Schedule an appointment as soon as possible for a visit in 2 weeks.   Specialty: Orthopedic Surgery Contact information: [address] [place] [address] [phone]        Marton Redwood, MD. Schedule an appointment as soon as possible for a visit in 1 week(s).   Specialty: Internal Medicine Contact information: [place] [address] [phone]        [name], MD .   Specialties: Cardiology, Radiology, Interventional Cardiology Contact information: [address] [place]  [address] [phone]              Allergies  Allergen Reactions  . Sulfa Antibiotics Nausea And Vomiting    Severe vomiting.     You were cared for by a hospitalist during your hospital stay. If you have any questions about your discharge medications or the care you received while you were in the hospital after you are discharged, you can call the unit and asked to speak with the hospitalist on call if the hospitalist that took care of you is not available. Once you are discharged, your primary care physician will handle any further medical issues. Please note that no refills for any discharge medications will be authorized once you are discharged, as it is imperative that you return to your primary care physician (or establish a relationship with a primary care physician if you do not have one) for your aftercare needs so that they can reassess your need for medications and monitor your lab values.   Procedures/Studies: MR FOOT RIGHT W WO CONTRAST  Result Date: 06/19/2020 CLINICAL DATA:  Medial plantar wound along the amputation site. EXAM: MRI OF THE RIGHT FOREFOOT WITHOUT AND WITH CONTRAST TECHNIQUE: Multiplanar, multisequence MR imaging of the right forefoot was performed before and after the administration of intravenous contrast. CONTRAST:  7.47mL GADAVIST GADOBUTROL 1 MMOL/ML IV SOLN COMPARISON:  10/28/2015 FINDINGS: Bones/Joint/Cartilage Prior transmetatarsal amputation through the base of the metatarsals. Soft tissue wound along the plantar medial aspect of the amputation site with severe surrounding soft tissue edema. Bone marrow edema in the plantar medial aspect of the medial cuneiform with cortical destruction consistent with osteomyelitis. Bone marrow edema in the residual bases of the metatarsals which may reflect reactive postsurgical changes versus osteomyelitis. No acute fracture or dislocation. Normal alignment. No joint effusion. Osteoarthritis of the talonavicular joint,  calcaneocuboid joint and cuneiform-navicular joint. Moderate osteoarthritis of the tibiotalar joint with high-grade partial-thickness cartilage loss and subchondral reactive marrow edema. Small plantar calcaneal spur. Enthesopathic changes of the Achilles tendon insertion. Ligaments, Muscles and Tendons Flexor extensor and peroneal tendons demonstrate no focal abnormality. Disruption of the distal aspect of the plantar fascia consistent with postsurgical changes. Along the superficial aspect of the medial band of the plantar fascia at the level of the navicular there is a 2.3 x 3 x 3.4 cm complex fluid collection with a sinus tract to the wound along the plantar medial aspect consistent with an abscess. Severe tendinosis of the proximal in mid Achilles tendon with  a partial tear. Soft tissue Along the superficial aspect of the medial band of the plantar fascia at the level of the navicular there is a 2.3 x 3 x 3.4 cm complex fluid collection with a sinus tract to the wound along the plantar medial aspect consistent with an abscess. Surrounding soft tissue edema likely reflecting cellulitis. IMPRESSION: 1. Prior transmetatarsal amputation through the base of the metatarsals. Soft tissue wound along the plantar medial aspect of the amputation site with severe surrounding soft tissue edema likely reflecting cellulitis. Bone marrow edema in the plantar medial aspect of the medial cuneiform with cortical destruction consistent with osteomyelitis. 2. Along the superficial aspect of the medial band of the plantar fascia at the level of the navicular there is a 2.3 x 3 x 3.4 cm complex fluid collection with a sinus tract to the wound along the plantar medial aspect consistent with an abscess. 3. Bone marrow edema in the residual bases of the metatarsals which may reflect reactive postsurgical changes versus osteomyelitis. 4. Severe tendinosis of the proximal in mid Achilles tendon with a partial tear. Electronically Signed    By: Kathreen Devoid   On: 06/19/2020 11:46   DG Foot Complete Right  Result Date: 06/18/2020 CLINICAL DATA:  86 year old female with infection of the right foot. EXAM: RIGHT FOOT COMPLETE - 3+ VIEW COMPARISON:  Right foot radiograph dated 05/24/2017. FINDINGS: Status post transmetatarsal amputation. There is no acute fracture or dislocation. The bones are osteopenic. Postsurgical changes and soft tissue thickening of the foot over the amputation. No radiopaque foreign object or soft tissue gas. Overlying cast or dressing noted. IMPRESSION: Status post transmetatarsal amputation. No acute fracture or dislocation. Electronically Signed   By: Anner Crete M.D.   On: 06/18/2020 15:55     The results of significant diagnostics from this hospitalization (including imaging, microbiology, ancillary and laboratory) are listed below for reference.     Microbiology: Recent Results (from the past 240 hour(s))  Resp Panel by RT-PCR (Flu A&B, Covid) Nasopharyngeal Swab     Status: None   Collection Time: 06/19/20  6:45 AM   Specimen: Nasopharyngeal Swab; Nasopharyngeal(NP) swabs in vial transport medium  Result Value Ref Range Status   SARS Coronavirus 2 by RT PCR NEGATIVE NEGATIVE Final    Comment: (NOTE) SARS-CoV-2 target nucleic acids are NOT DETECTED.  The SARS-CoV-2 RNA is generally detectable in upper respiratory specimens during the acute phase of infection. The lowest concentration of SARS-CoV-2 viral copies this assay can detect is 138 copies/mL. A negative result does not preclude SARS-Cov-2 infection and should not be used as the sole basis for treatment or other patient management decisions. A negative result may occur with  improper specimen collection/handling, submission of specimen other than nasopharyngeal swab, presence of viral mutation(s) within the areas targeted by this assay, and inadequate number of viral copies(<138 copies/mL). A negative result must be combined  with clinical observations, patient history, and epidemiological information. The expected result is Negative.  Fact Sheet for Patients:  EntrepreneurPulse.com.au  Fact Sheet for Healthcare Providers:  IncredibleEmployment.be  This test is no t yet approved or cleared by the [place] FDA and  has been authorized for detection and/or diagnosis of SARS-CoV-2 by FDA under an Emergency Use Authorization (EUA). This EUA will remain  in effect (meaning this test can be used) for the duration of the COVID-19 declaration under Section 564(b)(1) of the Act, 21 U.S.C.section 360bbb-3(b)(1), unless the authorization is terminated  or revoked sooner.  Influenza A by PCR NEGATIVE NEGATIVE Final   Influenza B by PCR NEGATIVE NEGATIVE Final    Comment: (NOTE) The Xpert Xpress SARS-CoV-2/FLU/RSV plus assay is intended as an aid in the diagnosis of influenza from Nasopharyngeal swab specimens and should not be used as a sole basis for treatment. Nasal washings and aspirates are unacceptable for Xpert Xpress SARS-CoV-2/FLU/RSV testing.  Fact Sheet for Patients: EntrepreneurPulse.com.au  Fact Sheet for Healthcare Providers: IncredibleEmployment.be  This test is not yet approved or cleared by the [place] FDA and has been authorized for detection and/or diagnosis of SARS-CoV-2 by FDA under an Emergency Use Authorization (EUA). This EUA will remain in effect (meaning this test can be used) for the duration of the COVID-19 declaration under Section 564(b)(1) of the Act, 21 U.S.C. section 360bbb-3(b)(1), unless the authorization is terminated or revoked.  Performed at [facility], [place] [address] [place], [place] [address]   Culture, blood (routine x 2)     Status: None   Collection Time: 06/19/20  7:35 AM   Specimen: BLOOD  Result Value Ref Range Status   Specimen Description BLOOD SITE NOT  SPECIFIED  Final   Special Requests   Final    BOTTLES DRAWN AEROBIC AND ANAEROBIC Blood Culture results may not be optimal due to an excessive volume of blood received in culture bottles   Culture   Final    NO GROWTH 5 DAYS Performed at [facility], [place] [address], [place], [place] [address]    Report Status 06/24/2020 FINAL  Final  Culture, blood (routine x 2)     Status: None   Collection Time: 06/19/20  7:40 AM   Specimen: BLOOD  Result Value Ref Range Status   Specimen Description BLOOD SITE NOT SPECIFIED  Final   Special Requests   Final    BOTTLES DRAWN AEROBIC AND ANAEROBIC Blood Culture adequate volume   Culture   Final    NO GROWTH 5 DAYS Performed at [facility], [address], [place], [place] [address]    Report Status 06/24/2020 FINAL  Final  Surgical pcr screen     Status: Abnormal   Collection Time: 06/22/20 12:14 AM   Specimen: Nasal Mucosa; Nasal Swab  Result Value Ref Range Status   MRSA, PCR NEGATIVE NEGATIVE Final   Staphylococcus aureus POSITIVE (A) NEGATIVE Final    Comment: CRITICAL RESULT CALLED TO, READ BACK BY AND VERIFIED WITH: RN Tinnie Gens [phone] @[id number]  THANEY Performed at [place] [address] [place], [place] [address]   Resp Panel by RT-PCR (Flu A&B, Covid) Nasopharyngeal Swab     Status: None   Collection Time: 06/28/20  1:45 PM   Specimen: Nasopharyngeal Swab; Nasopharyngeal(NP) swabs in vial transport medium  Result Value Ref Range Status   SARS Coronavirus 2 by RT PCR NEGATIVE NEGATIVE Final    Comment: (NOTE) SARS-CoV-2 target nucleic acids are NOT DETECTED.  The SARS-CoV-2 RNA is generally detectable in upper respiratory specimens during the acute phase of infection. The lowest concentration of SARS-CoV-2 viral copies this assay can detect is 138 copies/mL. A negative result does not preclude SARS-Cov-2 infection and should not be used as the sole basis for treatment or other patient management decisions.  A negative result may occur with  improper specimen collection/handling, submission of specimen other than nasopharyngeal swab, presence of viral mutation(s) within the areas targeted by this assay, and inadequate number of viral copies(<138 copies/mL). A negative result must  be combined with clinical observations, patient history, and epidemiological information. The expected result is Negative.  Fact Sheet for Patients:  EntrepreneurPulse.com.au  Fact Sheet for Healthcare Providers:  IncredibleEmployment.be  This test is no t yet approved or cleared by the [place] FDA and  has been authorized for detection and/or diagnosis of SARS-CoV-2 by FDA under an Emergency Use Authorization (EUA). This EUA will remain  in effect (meaning this test can be used) for the duration of the COVID-19 declaration under Section 564(b)(1) of the Act, 21 U.S.C.section 360bbb-3(b)(1), unless the authorization is terminated  or revoked sooner.       Influenza A by PCR NEGATIVE NEGATIVE Final   Influenza B by PCR NEGATIVE NEGATIVE Final    Comment: (NOTE) The Xpert Xpress SARS-CoV-2/FLU/RSV plus assay is intended as an aid in the diagnosis of influenza from Nasopharyngeal swab specimens and should not be used as a sole basis for treatment. Nasal washings and aspirates are unacceptable for Xpert Xpress SARS-CoV-2/FLU/RSV testing.  Fact Sheet for Patients: EntrepreneurPulse.com.au  Fact Sheet for Healthcare Providers: IncredibleEmployment.be  This test is not yet approved or cleared by the [place] FDA and has been authorized for detection and/or diagnosis of SARS-CoV-2 by FDA under an Emergency Use Authorization (EUA). This EUA will remain in effect (meaning this test can be used) for the duration of the COVID-19 declaration under Section 564(b)(1) of the Act, 21 U.S.C. section 360bbb-3(b)(1), unless the authorization  is terminated or revoked.  Performed at [facility], [place] [address] [place], [place] [address]      Labs: BNP (last 3 results) Recent Labs    03/12/20 2013  BNP 5,638.7*   Basic Metabolic Panel: Recent Labs  Lab 06/24/20 0317 06/25/20 0324 06/26/20 0250 06/27/20 0357 06/28/20 0121  NA 141 141 140 140 140  K 4.2 4.3 3.8 3.4* 3.5  CL 107 103 104 104 105  CO2 27 28 28 27 26   GLUCOSE 148* 156* 149* 149* 177*  BUN 25* 20 15 16 22   CREATININE 0.76 0.55 0.54 0.62 0.53  CALCIUM 8.7* 9.0 8.7* 8.5* 8.7*  MG 1.8 1.9 1.9 2.1 1.9   Liver Function Tests: No results for input(s): AST, ALT, ALKPHOS, BILITOT, PROT, ALBUMIN in the last 168 hours. No results for input(s): LIPASE, AMYLASE in the last 168 hours. No results for input(s): AMMONIA in the last 168 hours. CBC: Recent Labs  Lab 06/22/20 0514 06/23/20 0221  WBC 10.7* 12.5*  NEUTROABS 7.6 11.3*  HGB 9.0* 9.6*  HCT 29.7* 29.8*  MCV 86.1 84.4  PLT 412* 430*   Cardiac Enzymes: No results for input(s): CKTOTAL, CKMB, CKMBINDEX, TROPONINI in the last 168 hours. BNP: Invalid input(s): POCBNP CBG: Recent Labs  Lab 06/27/20 1608 06/27/20 1948 06/28/20 0658 06/28/20 1014 06/28/20 1153  GLUCAP 127* 197* 129* 145* 190*   D-Dimer No results for input(s): DDIMER in the last 72 hours. Hgb A1c No results for input(s): HGBA1C in the last 72 hours. Lipid Profile No results for input(s): CHOL, HDL, LDLCALC, TRIG, CHOLHDL, LDLDIRECT in the last 72 hours. Thyroid function studies No results for input(s): TSH, T4TOTAL, T3FREE, THYROIDAB in the last 72 hours.  Invalid input(s): FREET3 Anemia work up No results for input(s): VITAMINB12, FOLATE, FERRITIN, TIBC, IRON, RETICCTPCT in the last 72 hours. Urinalysis    Component Value Date/Time   COLORURINE YELLOW 06/18/2020 2016   APPEARANCEUR CLEAR 06/18/2020 2016   LABSPEC 1.015 06/18/2020 2016   PHURINE 5.0 06/18/2020 2016   GLUCOSEU  NEGATIVE 06/18/2020 2016    HGBUR NEGATIVE 06/18/2020 2016   BILIRUBINUR NEGATIVE 06/18/2020 2016   KETONESUR NEGATIVE 06/18/2020 2016   PROTEINUR NEGATIVE 06/18/2020 2016   NITRITE NEGATIVE 06/18/2020 2016   LEUKOCYTESUR NEGATIVE 06/18/2020 2016   Sepsis Labs Invalid input(s): PROCALCITONIN,  WBC,  LACTICIDVEN Microbiology Recent Results (from the past 240 hour(s))  Resp Panel by RT-PCR (Flu A&B, Covid) Nasopharyngeal Swab     Status: None   Collection Time: 06/19/20  6:45 AM   Specimen: Nasopharyngeal Swab; Nasopharyngeal(NP) swabs in vial transport medium  Result Value Ref Range Status   SARS Coronavirus 2 by RT PCR NEGATIVE NEGATIVE Final    Comment: (NOTE) SARS-CoV-2 target nucleic acids are NOT DETECTED.  The SARS-CoV-2 RNA is generally detectable in upper respiratory specimens during the acute phase of infection. The lowest concentration of SARS-CoV-2 viral copies this assay can detect is 138 copies/mL. A negative result does not preclude SARS-Cov-2 infection and should not be used as the sole basis for treatment or other patient management decisions. A negative result may occur with  improper specimen collection/handling, submission of specimen other than nasopharyngeal swab, presence of viral mutation(s) within the areas targeted by this assay, and inadequate number of viral copies(<138 copies/mL). A negative result must be combined with clinical observations, patient history, and epidemiological information. The expected result is Negative.  Fact Sheet for Patients:  EntrepreneurPulse.com.au  Fact Sheet for Healthcare Providers:  IncredibleEmployment.be  This test is no t yet approved or cleared by the [place] FDA and  has been authorized for detection and/or diagnosis of SARS-CoV-2 by FDA under an Emergency Use Authorization (EUA). This EUA will remain  in effect (meaning this test can be used) for the duration of the COVID-19 declaration under  Section 564(b)(1) of the Act, 21 U.S.C.section 360bbb-3(b)(1), unless the authorization is terminated  or revoked sooner.       Influenza A by PCR NEGATIVE NEGATIVE Final   Influenza B by PCR NEGATIVE NEGATIVE Final    Comment: (NOTE) The Xpert Xpress SARS-CoV-2/FLU/RSV plus assay is intended as an aid in the diagnosis of influenza from Nasopharyngeal swab specimens and should not be used as a sole basis for treatment. Nasal washings and aspirates are unacceptable for Xpert Xpress SARS-CoV-2/FLU/RSV testing.  Fact Sheet for Patients: EntrepreneurPulse.com.au  Fact Sheet for Healthcare Providers: IncredibleEmployment.be  This test is not yet approved or cleared by the [place] FDA and has been authorized for detection and/or diagnosis of SARS-CoV-2 by FDA under an Emergency Use Authorization (EUA). This EUA will remain in effect (meaning this test can be used) for the duration of the COVID-19 declaration under Section 564(b)(1) of the Act, 21 U.S.C. section 360bbb-3(b)(1), unless the authorization is terminated or revoked.  Performed at [facility], [place] [address], [place], [place] [address]   Culture, blood (routine x 2)     Status: None   Collection Time: 06/19/20  7:35 AM   Specimen: BLOOD  Result Value Ref Range Status   Specimen Description BLOOD SITE NOT SPECIFIED  Final   Special Requests   Final    BOTTLES DRAWN AEROBIC AND ANAEROBIC Blood Culture results may not be optimal due to an excessive volume of blood received in culture bottles   Culture   Final    NO GROWTH 5 DAYS Performed at [facility], [place] [address], [place], [place] [address]    Report Status 06/24/2020 FINAL  Final  Culture, blood (routine x 2)  Status: None   Collection Time: 06/19/20  7:40 AM   Specimen: BLOOD  Result Value Ref Range Status   Specimen Description BLOOD SITE NOT SPECIFIED  Final   Special Requests   Final    BOTTLES  DRAWN AEROBIC AND ANAEROBIC Blood Culture adequate volume   Culture   Final    NO GROWTH 5 DAYS Performed at [facility], [address] [place], [place] [address]    Report Status 06/24/2020 FINAL  Final  Surgical pcr screen     Status: Abnormal   Collection Time: 06/22/20 12:14 AM   Specimen: Nasal Mucosa; Nasal Swab  Result Value Ref Range Status   MRSA, PCR NEGATIVE NEGATIVE Final   Staphylococcus aureus POSITIVE (A) NEGATIVE Final    Comment: CRITICAL RESULT CALLED TO, READ BACK BY AND VERIFIED WITH: RN Tinnie Gens [phone] @0223  THANEY Performed at [facility], [place] [address] [place], [place] [address]   Resp Panel by RT-PCR (Flu A&B, Covid) Nasopharyngeal Swab     Status: None   Collection Time: 06/28/20  1:45 PM   Specimen: Nasopharyngeal Swab; Nasopharyngeal(NP) swabs in vial transport medium  Result Value Ref Range Status   SARS Coronavirus 2 by RT PCR NEGATIVE NEGATIVE Final    Comment: (NOTE) SARS-CoV-2 target nucleic acids are NOT DETECTED.  The SARS-CoV-2 RNA is generally detectable in upper respiratory specimens during the acute phase of infection. The lowest concentration of SARS-CoV-2 viral copies this assay can detect is 138 copies/mL. A negative result does not preclude SARS-Cov-2 infection and should not be used as the sole basis for treatment or other patient management decisions. A negative result may occur with  improper specimen collection/handling, submission of specimen other than nasopharyngeal swab, presence of viral mutation(s) within the areas targeted by this assay, and inadequate number of viral copies(<138 copies/mL). A negative result must be combined with clinical observations, patient history, and epidemiological information. The expected result is Negative.  Fact Sheet for Patients:  EntrepreneurPulse.com.au  Fact Sheet for Healthcare Providers:  IncredibleEmployment.be  This test is no t  yet approved or cleared by the [place] FDA and  has been authorized for detection and/or diagnosis of SARS-CoV-2 by FDA under an Emergency Use Authorization (EUA). This EUA will remain  in effect (meaning this test can be used) for the duration of the COVID-19 declaration under Section 564(b)(1) of the Act, 21 U.S.C.section 360bbb-3(b)(1), unless the authorization is terminated  or revoked sooner.       Influenza A by PCR NEGATIVE NEGATIVE Final   Influenza B by PCR NEGATIVE NEGATIVE Final    Comment: (NOTE) The Xpert Xpress SARS-CoV-2/FLU/RSV plus assay is intended as an aid in the diagnosis of influenza from Nasopharyngeal swab specimens and should not be used as a sole basis for treatment. Nasal washings and aspirates are unacceptable for Xpert Xpress SARS-CoV-2/FLU/RSV testing.  Fact Sheet for Patients: EntrepreneurPulse.com.au  Fact Sheet for Healthcare Providers: IncredibleEmployment.be  This test is not yet approved or cleared by the [place] FDA and has been authorized for detection and/or diagnosis of SARS-CoV-2 by FDA under an Emergency Use Authorization (EUA). This EUA will remain in effect (meaning this test can be used) for the duration of the COVID-19 declaration under Section 564(b)(1) of the Act, 21 U.S.C. section 360bbb-3(b)(1), unless the authorization is terminated or revoked.  Performed at [facility], [place] [address], [place],  [address]      Time coordinating discharge:  I have  spent 35 minutes face to face with the patient and on the ward discussing the patients care, assessment, plan and disposition with other care givers. >50% of the time was devoted counseling the patient about the risks and benefits of treatment/Discharge disposition and coordinating care.   SIGNED:   Damita Lack, MD  [facility] 06/28/2020, 3:36 PM   If 7PM-7AM, please contact night-coverage

## 2020-06-25 LAB — GLUCOSE, CAPILLARY
Glucose-Capillary: 163 mg/dL — ABNORMAL HIGH (ref 70–99)
Glucose-Capillary: 187 mg/dL — ABNORMAL HIGH (ref 70–99)
Glucose-Capillary: 124 mg/dL — ABNORMAL HIGH (ref 70–99)
Glucose-Capillary: 142 mg/dL — ABNORMAL HIGH (ref 70–99)

## 2020-06-25 LAB — BASIC METABOLIC PANEL
Anion gap: 10 (ref 5–15)
BUN: 20 mg/dL (ref 8–23)
CO2: 28 mmol/L (ref 22–32)
Calcium: 9 mg/dL (ref 8.9–10.3)
Chloride: 103 mmol/L (ref 98–111)
Creatinine, Ser: 0.55 mg/dL (ref 0.44–1.00)
GFR, Estimated: >60 mL/min (ref >60)
Glucose, Bld: 156 mg/dL — ABNORMAL HIGH (ref 70–99)
Potassium: 4.3 mmol/L (ref 3.5–5.1)
Sodium: 141 mmol/L (ref 135–145)

## 2020-06-25 LAB — SURGICAL PATHOLOGY

## 2020-06-25 LAB — MAGNESIUM: Magnesium: 1.9 mg/dL (ref 1.7–2.4)

## 2020-06-25 MED ORDER — LISINOPRIL 20 MG PO TABS
20.0000 mg | ORAL_TABLET | Freq: Every evening | ORAL | Status: DC
  Administered 2020-06-25 – 2020-06-28 (×4): 20 mg via ORAL
  Filled 2020-06-25 (×4): qty 1

## 2020-06-25 MED ORDER — CLOPIDOGREL BISULFATE 75 MG PO TABS
75.0000 mg | ORAL_TABLET | Freq: Every day | ORAL | Status: DC
  Administered 2020-06-25 – 2020-06-28 (×4): 75 mg via ORAL
  Filled 2020-06-25 (×4): qty 1

## 2020-06-25 NOTE — Progress Notes (Signed)
Subjective: 3 Days Post-Op Procedure(s) (LRB): AMPUTATION BELOW KNEE (Right)  Patient reports pain as mild to moderate.  Tolerating POs well.  Admits to flatus.  Denies fever, chills, N/V, CP, SOB.  Hopeful for CIR placement.  Objective:   VITALS:  Temp:  [97.8 F (36.6 C)-98 F (36.7 C)] 98 F (36.7 C) (12/09 1949) Pulse Rate:  [62-75] 75 (12/09 1949) Resp:  [16-17] 17 (12/09 1949) BP: (132-174)/(51-61) 150/54 (12/09 1949) SpO2:  [92 %-96 %] 92 % (12/09 1949)  General: WDWN patient in NAD. Psych:  Appropriate mood and affect. Neuro:  A&O x 3, Moving all extremities, sensation intact to light touch HEENT:  EOMs intact Chest:  Even non-labored respirations Skin: Dressing and stump protector C/D/I, no rashes or lesions Extremities: warm/dry, no visible edema, erythema or echymosis.  No lymphadenopathy. Pulses: Femoral 2+ MSK:  ROM: TKE, MMT: able to perform quad set    LABS Recent Labs    06/23/20 0221  HGB 9.6*  WBC 12.5*  PLT 430*   Recent Labs    06/24/20 0317 06/25/20 0324  NA 141 141  K 4.2 4.3  CL 107 103  CO2 27 28  BUN 25* 20  CREATININE 0.76 0.55  GLUCOSE 148* 156*   No results for input(s): LABPT, INR in the last 72 hours.   Assessment/Plan: 3 Days Post-Op Procedure(s) (LRB): AMPUTATION BELOW KNEE (Right)  NWB R LE Stump protector at all times Disp:  CIR vs SNF Plan for 2 week outpatient post-op visit.  Mechele Claude PA-C [facility] Office:  [phone]

## 2020-06-25 NOTE — Progress Notes (Signed)
Inpatient Rehabilitation Admissions Coordinator  I spoke with patient to make her aware that I have not yet received a determination from [employer] on Cir approval, but that I would not have a Cir bed into first of next week available. Other rehab venues need to be explored. I have alerted acute team and TOC.  Danne Baxter, RN, MSN Rehab Admissions Coordinator [phone] 06/25/2020 10:15 AM

## 2020-06-25 NOTE — Progress Notes (Signed)
PROGRESS NOTE    [name]  MCN:[id number] DOB: 1931-11-10 DOA: 06/18/2020 PCP: Marton Redwood, MD   Brief Narrative:  86 year old with history of peripheral vascular disease, hypothyroidism, HTN, DM2, CAD, diastolic CHF, diabetic foot status post right TMA amputation 8/26 was treated outpatient with wound VAC and multiple courses of oral antibiotic with progression of right foot infection. Now presents with osteomyelitis and abscess in this area confirmed on MRI.Started on broad-spectrum antibiotics and orthopedic was consulted but after her BKA on 12/7 antibiotics were discontinued she did well.  Stump protector was placed.  PT recommended CIR therefore arrangements made.   Assessment & Plan:   Principal Problem:   Diabetic infection of right foot ([place]) Active Problems:   Coronary artery disease involving native coronary artery of native heart   Essential hypertension   Diabetes mellitus with peripheral vascular disease (HCC)   Mixed hyperlipidemia   Chronic diastolic heart failure (HCC)    Right diabetic foot infection, with cellulitis, osteomyelitis and abscess(present on admission).Status post right BKA 12/7 -Wound cultures from 06/17/2020 + for MRSA. Negative blood cultures -We will stop the antibiotics today. -Pain control, bowel regimen -PT OT recommending CIR.  Rest of recommendations as listed above.  Follow-up with outpatient orthopedic in 2 weeks  T2DM/ dyslipidemia. -Accu-Cheks and sliding scale.  Long-acting Lantus  HTN Continue blood pressure control withlisinopriland carvedilol.  Lisinopril reduced to 10 mg daily  Chronic diastolic heart failure Noclinical signs of acuteexacerbation.  Depression Continue withparoxetine.  GERD Continue with pantoprazole.   DVT prophylaxis: enoxaparin (LOVENOX) injection 40 mg Start: 06/23/20 0800 SCDs Start: 06/22/20 1820  Code Status: DNR Family Communication: Son at bedside  Status is:  Inpatient  Remains inpatient appropriate because:Inpatient level of care appropriate due to severity of illness   Dispo: The patient is from: Home              Anticipated d/c is to: SNF              Anticipated d/c date is: Today              Patient currently is medically cleared to be discharged     Body mass index is 25.44 kg/m.        Subjective: Feels okay no complaints.   Examination:  Constitutional: Not in acute distress Respiratory: Clear to auscultation bilaterally Cardiovascular: Normal sinus rhythm, no rubs Abdomen: Nontender nondistended good bowel sounds Musculoskeletal: No edema noted Skin: Right lower extremity dressing noted.  Stump protector in place Neurologic: CN 2-12 grossly intact.  And nonfocal Psychiatric: Normal judgment and insight. Alert and oriented x 3. Normal mood.  Objective: Vitals:   06/24/20 0850 06/24/20 1451 06/24/20 1949 06/25/20 0809  BP: (!) 174/61 (!) 132/51 (!) 150/54 (!) 173/70  Pulse: 75 62 75 70  Resp: 17 16 17 17   Temp: 98 F (36.7 C) 97.8 F (36.6 C) 98 F (36.7 C) 97.7 F (36.5 C)  TempSrc: Oral Oral Oral Oral  SpO2: 93% 96% 92% 98%  Weight:      Height:        Intake/Output Summary (Last 24 hours) at 06/25/2020 1140 Last data filed at 06/25/2020 0900 Gross per 24 hour  Intake 240 ml  Output 400 ml  Net -160 ml   Filed Weights   06/18/20 1503 06/19/20 1149  Weight: 75.8 kg 75.9 kg     Data Reviewed:   CBC: Recent Labs  Lab 06/18/20 1513 06/20/20 0227 06/21/20 0447 06/22/20 0514 06/23/20  0221  WBC 14.3* 9.0 8.2 10.7* 12.5*  NEUTROABS 10.2*  --  5.0 7.6 11.3*  HGB 9.8* 8.4* 9.0* 9.0* 9.6*  HCT 31.3* 26.6* 28.1* 29.7* 29.8*  MCV 85.5 84.7 84.6 86.1 84.4  PLT 443* 405* 420* 412* 315*   Basic Metabolic Panel: Recent Labs  Lab 06/20/20 0227 06/21/20 0447 06/23/20 0221 06/24/20 0317 06/25/20 0324  NA 138 139 136 141 141  K 3.9 4.0 3.9 4.2 4.3  CL 104 105 104 107 103  CO2 25 25 23 27  28   GLUCOSE 189* 169* 290* 148* 156*  BUN 24* 23 16 25* 20  CREATININE 0.76 0.63 0.64 0.76 0.55  CALCIUM 8.8* 9.1 8.3* 8.7* 9.0  MG  --   --   --  1.8 1.9   GFR: Estimated Creatinine Clearance: 51.9 mL/min (by C-G formula based on SCr of 0.55 mg/dL). Liver Function Tests: Recent Labs  Lab 06/18/20 1513  AST 19  ALT 15  ALKPHOS 66  BILITOT 0.4  PROT 6.8  ALBUMIN 3.1*   No results for input(s): LIPASE, AMYLASE in the last 168 hours. No results for input(s): AMMONIA in the last 168 hours. Coagulation Profile: No results for input(s): INR, PROTIME in the last 168 hours. Cardiac Enzymes: No results for input(s): CKTOTAL, CKMB, CKMBINDEX, TROPONINI in the last 168 hours. BNP (last 3 results) No results for input(s): PROBNP in the last 8760 hours. HbA1C: No results for input(s): HGBA1C in the last 72 hours. CBG: Recent Labs  Lab 06/24/20 0633 06/24/20 1114 06/24/20 1645 06/24/20 2000 06/25/20 0708  GLUCAP 143* 145* 177* 205* 163*   Lipid Profile: No results for input(s): CHOL, HDL, LDLCALC, TRIG, CHOLHDL, LDLDIRECT in the last 72 hours. Thyroid Function Tests: No results for input(s): TSH, T4TOTAL, FREET4, T3FREE, THYROIDAB in the last 72 hours. Anemia Panel: No results for input(s): VITAMINB12, FOLATE, FERRITIN, TIBC, IRON, RETICCTPCT in the last 72 hours. Sepsis Labs: Recent Labs  Lab 06/18/20 1513  LATICACIDVEN 1.2    Recent Results (from the past 240 hour(s))  Aerobic Culture (superficial specimen)     Status: None   Collection Time: 06/17/20  9:20 AM   Specimen: Wound  Result Value Ref Range Status   Specimen Description   Final    WOUND SITE NOT SPECIFIED Performed at [place] [address] [place], [place] [address]    Special Requests   Final    NONE Performed at [facility], [place] [address], [place], [place] [address]    Gram Stain NO WBC SEEN NO ORGANISMS SEEN   Final   Culture   Final    RARE  METHICILLIN RESISTANT STAPHYLOCOCCUS AUREUS RARE CORYNEBACTERIUM STRIATUM Standardized susceptibility testing for this organism is not available. Performed at [facility], [place] [address] [place], [place] [address]    Report Status 06/22/2020 FINAL  Final   Organism ID, Bacteria METHICILLIN RESISTANT STAPHYLOCOCCUS AUREUS  Final      Susceptibility   Methicillin resistant staphylococcus aureus - MIC*    CIPROFLOXACIN <=0.5 SENSITIVE Sensitive     ERYTHROMYCIN <=0.25 SENSITIVE Sensitive     GENTAMICIN <=0.5 SENSITIVE Sensitive     OXACILLIN RESISTANT Resistant     TETRACYCLINE >=16 RESISTANT Resistant     VANCOMYCIN 1 SENSITIVE Sensitive     TRIMETH/SULFA <=10 SENSITIVE Sensitive     CLINDAMYCIN <=0.25 SENSITIVE Sensitive     RIFAMPIN <=0.5 SENSITIVE Sensitive     Inducible Clindamycin NEGATIVE Sensitive     *  RARE METHICILLIN RESISTANT STAPHYLOCOCCUS AUREUS  Resp Panel by RT-PCR (Flu A&B, Covid) Nasopharyngeal Swab     Status: None   Collection Time: 06/19/20  6:45 AM   Specimen: Nasopharyngeal Swab; Nasopharyngeal(NP) swabs in vial transport medium  Result Value Ref Range Status   SARS Coronavirus 2 by RT PCR NEGATIVE NEGATIVE Final    Comment: (NOTE) SARS-CoV-2 target nucleic acids are NOT DETECTED.  The SARS-CoV-2 RNA is generally detectable in upper respiratory specimens during the acute phase of infection. The lowest concentration of SARS-CoV-2 viral copies this assay can detect is 138 copies/mL. A negative result does not preclude SARS-Cov-2 infection and should not be used as the sole basis for treatment or other patient management decisions. A negative result may occur with  improper specimen collection/handling, submission of specimen other than nasopharyngeal swab, presence of viral mutation(s) within the areas targeted by this assay, and inadequate number of viral copies(<138 copies/mL). A negative result must be combined with clinical observations, patient  history, and epidemiological information. The expected result is Negative.  Fact Sheet for Patients:  EntrepreneurPulse.com.au  Fact Sheet for Healthcare Providers:  IncredibleEmployment.be  This test is no t yet approved or cleared by the [place] FDA and  has been authorized for detection and/or diagnosis of SARS-CoV-2 by FDA under an Emergency Use Authorization (EUA). This EUA will remain  in effect (meaning this test can be used) for the duration of the COVID-19 declaration under Section 564(b)(1) of the Act, 21 U.S.C.section 360bbb-3(b)(1), unless the authorization is terminated  or revoked sooner.       Influenza A by PCR NEGATIVE NEGATIVE Final   Influenza B by PCR NEGATIVE NEGATIVE Final    Comment: (NOTE) The Xpert Xpress SARS-CoV-2/FLU/RSV plus assay is intended as an aid in the diagnosis of influenza from Nasopharyngeal swab specimens and should not be used as a sole basis for treatment. Nasal washings and aspirates are unacceptable for Xpert Xpress SARS-CoV-2/FLU/RSV testing.  Fact Sheet for Patients: EntrepreneurPulse.com.au  Fact Sheet for Healthcare Providers: IncredibleEmployment.be  This test is not yet approved or cleared by the [place] FDA and has been authorized for detection and/or diagnosis of SARS-CoV-2 by FDA under an Emergency Use Authorization (EUA). This EUA will remain in effect (meaning this test can be used) for the duration of the COVID-19 declaration under Section 564(b)(1) of the Act, 21 U.S.C. section 360bbb-3(b)(1), unless the authorization is terminated or revoked.  Performed at [facility], [place] [address] [place], [place] [address]   Culture, blood (routine x 2)     Status: None   Collection Time: 06/19/20  7:35 AM   Specimen: BLOOD  Result Value Ref Range Status   Specimen Description BLOOD SITE NOT SPECIFIED  Final   Special Requests   Final     BOTTLES DRAWN AEROBIC AND ANAEROBIC Blood Culture results may not be optimal due to an excessive volume of blood received in culture bottles   Culture   Final    NO GROWTH 5 DAYS Performed at [facility], [place] [address], [place], [place] [address]    Report Status 06/24/2020 FINAL  Final  Culture, blood (routine x 2)     Status: None   Collection Time: 06/19/20  7:40 AM   Specimen: BLOOD  Result Value Ref Range Status   Specimen Description BLOOD SITE NOT SPECIFIED  Final   Special Requests   Final    BOTTLES DRAWN AEROBIC AND ANAEROBIC Blood Culture adequate volume  Culture   Final    NO GROWTH 5 DAYS Performed at [facility], [place] [address] [place], [place] [address]    Report Status 06/24/2020 FINAL  Final  Surgical pcr screen     Status: Abnormal   Collection Time: 06/22/20 12:14 AM   Specimen: Nasal Mucosa; Nasal Swab  Result Value Ref Range Status   MRSA, PCR NEGATIVE NEGATIVE Final   Staphylococcus aureus POSITIVE (A) NEGATIVE Final    Comment: CRITICAL RESULT CALLED TO, READ BACK BY AND VERIFIED WITH: RN Tinnie Gens [phone] @0223  THANEY Performed at [facility], [address], [place], [place] [address]          Radiology Studies: No results found.      Scheduled Meds: . (feeding supplement) PROSource Plus  30 mL Oral BID BM  . atorvastatin  20 mg Oral QPM  . bismuth subsalicylate  30 mL Oral TID AC & HS  . carvedilol  25 mg Oral BID WC  . Chlorhexidine Gluconate Cloth  6 each Topical Daily  . clopidogrel  75 mg Oral Daily  . enoxaparin (LOVENOX) injection  40 mg Subcutaneous Q24H  . insulin aspart  0-15 Units Subcutaneous TID WC  . insulin aspart  0-5 Units Subcutaneous QHS  . insulin aspart  4 Units Subcutaneous TID WC  . insulin glargine  30 Units Subcutaneous QHS  . lisinopril  20 mg Oral QPM  . multivitamin with minerals  1 tablet Oral Daily  . mupirocin ointment  1 application Nasal BID  . nutrition supplement (JUVEN)  1  packet Oral BID BM  . pantoprazole  40 mg Oral Daily  . PARoxetine  40 mg Oral QHS  . saccharomyces boulardii  250 mg Oral BID  . senna  1 tablet Oral BID  . sodium chloride flush  3 mL Intravenous Q12H   Continuous Infusions: . sodium chloride 75 mL/hr at 06/22/20 1840  . methocarbamol (ROBAXIN) IV       LOS: 6 days   Time spent= 15 mins    Astryd Pearcy Arsenio Loader, MD [facility]  If 7PM-7AM, please contact night-coverage  06/25/2020, 11:40 AM

## 2020-06-25 NOTE — Progress Notes (Signed)
Physical Therapy Treatment Patient Details Name: [name] MRN: [id number] DOB: 1932/01/14 Today's Date: 06/25/2020    History of Present Illness [name] is a 86 y.o. female with medical history significant of PVD; hypothyroidism; HTN; DM; CAD; and chronic combined CHF presenting with a foot infection. She had a R TMA on 8/26 that was not healing well. Did not improve with antibiotics so pt underwent R BKA on 12/7.    PT Comments    Pt supine on arrival, agreeable to therapy session and with good participation and tolerance for mobility. Pt making progress with gait attempts, able to hop with slight L foot clearance a few reps forward using RW and modA prior to fatiguing and needing to pivot to chair. Pt performed bed mobility with Supervision/min guard at most to stabilize. Pt continues to benefit from PT services to progress toward functional mobility goals. D/C recs below, updated slightly per discussion with Supervising PT Ryan L.   Follow Up Recommendations  CIR;Supervision for mobility/OOB (SNF if CIR bed unavailable)     Equipment Recommendations  Rolling walker with 5" wheels;3in1 (PT);Wheelchair (measurements PT);Wheelchair cushion (measurements PT);Other (comment)    Recommendations for Other Services       Precautions / Restrictions Precautions Precautions: Fall Required Braces or Orthoses: Other Brace Other Brace: limb protector Restrictions Weight Bearing Restrictions: Yes RLE Weight Bearing: Non weight bearing    Mobility  Bed Mobility Overal bed mobility: Needs Assistance Bed Mobility: Rolling;Sidelying to Sit Rolling: Supervision Sidelying to sit: Min guard          Transfers Overall transfer level: Needs assistance Equipment used: Rolling walker (2 wheeled) Transfers: Sit to/from [facility] Sit to Stand: Mod assist Stand pivot transfers: Mod assist;+2 safety/equipment       General transfer comment: pt with impaired  balance when attempting to pivot. Pt is able to shuffle foot some during session and able to hop x3 reps with rest breaks, but on subsequent attempts unable to deweight LLE due to fatigue, needed chair pulled closer for safety to sit.  Ambulation/Gait             General Gait Details: 2-3 forward hops only with +47modA   Stairs             Wheelchair Mobility    Modified Rankin (Stroke Patients Only)       Balance Overall balance assessment: Needs assistance Sitting-balance support: No upper extremity supported;Feet supported Sitting balance-Leahy Scale: Good Sitting balance - Comments: static sitting and weight shifting no LOB   Standing balance support: Bilateral upper extremity supported Standing balance-Leahy Scale: Poor Standing balance comment: reliant on UE support of RW and min/modA external assistq                            Cognition Arousal/Alertness: Awake/alert Behavior During Therapy: Anxious Overall Cognitive Status: Within Functional Limits for tasks assessed Area of Impairment: Problem solving                             Problem Solving: Difficulty sequencing;Requires verbal cues General Comments: pt needs cues for safe problem solving/assistive device use and sometimes needs repetition of cues for sequencing; motivated, agreeable      Exercises Amputee Exercises Quad Sets: AROM;Strengthening;Both;5 reps;Supine Gluteal Sets: AROM;Strengthening;Both;10 reps;Supine Towel Squeeze: AROM;Strengthening;Both;10 reps;Seated Hip Extension: AROM;Strengthening;Right;10 reps;Standing Hip ABduction/ADduction: AROM;Strengthening;Right;10 reps;Standing Hip Flexion/Marching: AROM;Strengthening;Both;10 reps;Seated Other Exercises  Other Exercises: STS x2 reps    General Comments General comments (skin integrity, edema, etc.): instruction on phantom limb pain reduction techniques, proper positioning, likely timeline for healing       Pertinent Vitals/Pain Pain Assessment: Faces Faces Pain Scale: Hurts a little bit Pain Location: R residual limb Pain Descriptors / Indicators: Grimacing;Guarding Pain Intervention(s): Monitored during session;Repositioned;Relaxation    Home Living                      Prior Function            PT Goals (current goals can now be found in the care plan section) Acute Rehab PT Goals Patient Stated Goal: be able to regain independence, be able to get prosthetic and walk normally again PT Goal Formulation: With patient Time For Goal Achievement: 07/07/20 Potential to Achieve Goals: Good Progress towards PT goals: Progressing toward goals    Frequency    Min 4X/week      PT Plan Current plan remains appropriate    Co-evaluation              AM-PAC PT "6 Clicks" Mobility   Outcome Measure  Help needed turning from your back to your side while in a flat bed without using bedrails?: A Little Help needed moving from lying on your back to sitting on the side of a flat bed without using bedrails?: A Little Help needed moving to and from a bed to a chair (including a wheelchair)?: A Lot Help needed standing up from a chair using your arms (e.g., wheelchair or bedside chair)?: A Lot Help needed to walk in hospital room?: A Lot Help needed climbing 3-5 steps with a railing? : Total 6 Click Score: 13    End of Session Equipment Utilized During Treatment: Gait belt Activity Tolerance: Patient tolerated treatment well Patient left: in chair;with call bell/phone within reach;with chair alarm set Nurse Communication: Mobility status PT Visit Diagnosis: Unsteadiness on feet (R26.81);Other abnormalities of gait and mobility (R26.89);Dizziness and giddiness (R42)     Time: 9977-4142 PT Time Calculation (min) (ACUTE ONLY): 25 min  Charges:  $Therapeutic Exercise: 8-22 mins                     [place] Acklin P., PTA Acute Rehabilitation Services Pager:  [phone] Office: [place] 06/25/2020, 1:10 PM

## 2020-06-25 NOTE — Progress Notes (Signed)
Inpatient Rehabilitation Admissions Coordinator  I have received a denial for Cir admit from [employer]. Patient was made aware. SNF recommended. I will alert TOC and we will sign off at this time.  Danne Baxter, RN, MSN Rehab Admissions Coordinator [phone] 06/25/2020 2:52 PM

## 2020-06-25 NOTE — NC FL2 (Signed)
[place] LEVEL OF CARE SCREENING TOOL     IDENTIFICATION  Patient Name: [name] Birthdate: May 12, 1932 Sex: female Admission Date (Current Location): 06/18/2020  [facility] and [place] Number:  Herbalist and Address:         Provider Number: [id number]  Attending Physician Name and Address:  Damita Lack, MD  Relative Name and Phone Number:  Tomeko Scoville, son - [phone]    Current Level of Care: Hospital Recommended Level of Care: [place] Prior Approval Number:    Date Approved/Denied:   PASRR Number: [id number]  Discharge Plan: SNF    Current Diagnoses: Patient Active Problem List   Diagnosis Date Noted  . Diabetic infection of right foot ([place]) 06/19/2020  . Chronic osteomyelitis of left foot ([place]) 06/19/2020  . HCAP (healthcare-associated pneumonia)   . Elevated troponin level not due myocardial infarction 03/13/2020  . GERD without esophagitis 03/13/2020  . Anemia 03/13/2020  . Thyroid goiter 03/13/2020  . Acute respiratory failure with hypoxia ([place]) 03/13/2020  . Type 2 diabetes mellitus with complication, without long-term current use of insulin ([place]) 03/13/2020  . Elevated liver enzymes 03/13/2020  . Acute on chronic congestive heart failure ([place]) 03/13/2020  . Acute cardiogenic pulmonary edema (HCC) 03/12/2020  . Diabetic ulcer of right foot ([place]) 03/12/2020  . Metatarsalgia of left foot 03/14/2019  . Amputated toe ([place]) 12/06/2018  . Achilles tendon contracture, left 07/24/2016  . Ulcer of left foot, limited to breakdown of skin ([place]) 06/01/2016  . Cellulitis 10/27/2015  . Severe peripheral arterial disease ([place]) 12/28/2014  . Chronic diastolic heart failure ([place]) 12/30/2013  . Edema 12/30/2013  . Mixed hyperlipidemia 06/24/2013  . Mitral valve disorders(424.0) 06/24/2013  . Coronary artery disease involving native coronary artery of native heart   . Essential hypertension   . Diabetes mellitus with  peripheral vascular disease ([place])   . Other primary cardiomyopathies     Orientation RESPIRATION BLADDER Height & Weight     Self,Time,Situation,Place  Normal Continent Weight: 75.9 kg Height:  5\' 8"  (172.7 cm)  BEHAVIORAL SYMPTOMS/MOOD NEUROLOGICAL BOWEL NUTRITION STATUS      Continent Diet (See discharge summary)  AMBULATORY STATUS COMMUNICATION OF NEEDS Skin   Extensive Assist Verbally Surgical wounds                       Personal Care Assistance Level of Assistance  Bathing,Feeding,Dressing Bathing Assistance: Limited assistance Feeding assistance: Independent Dressing Assistance: Limited assistance     Functional Limitations Info  Sight,Hearing,Speech Sight Info: Adequate Hearing Info: Adequate Speech Info: Adequate    SPECIAL CARE FACTORS FREQUENCY  PT (By licensed PT),OT (By licensed OT)     PT Frequency: 5 x per week OT Frequency: 5 x per week            Contractures Contractures Info: Not present    Additional Factors Info  Code Status,Allergies,Psychotropic,Insulin Sliding Scale (Patient is fully vaccinated for COVID - Moderna - including booster) Code Status Info: DNR Allergies Info: sulfa antibiotics Psychotropic Info: Paxil Insulin Sliding Scale Info: See discharge summary       Current Medications (06/25/2020):  This is the current hospital active medication list Current Facility-Administered Medications  Medication Dose Route Frequency Provider Last Rate Last Admin  . (feeding supplement) PROSource Plus liquid 30 mL  30 mL Oral BID BM Corky Sing, PA-C   30 mL at 06/25/20 0958  . 0.9 %  sodium chloride infusion  Intravenous Continuous Corky Sing, [place] 75 mL/hr at 06/22/20 1840 New Bag at 06/22/20 1840  . acetaminophen (TYLENOL) tablet 325-650 mg  325-650 mg Oral Q6H PRN Corky Sing, PA-C   650 mg at 06/24/20 2158  . atorvastatin (LIPITOR) tablet 20 mg  20 mg Oral QPM Corky Sing, PA-C   20 mg at 06/24/20 1743   . bismuth subsalicylate (PEPTO BISMOL) 262 MG/15ML suspension 30 mL  30 mL Oral TID AC & HS Corky Sing, PA-C   30 mL at 06/25/20 1003  . carvedilol (COREG) tablet 25 mg  25 mg Oral BID WC Corky Sing, PA-C   25 mg at 06/25/20 8250  . Chlorhexidine Gluconate Cloth 2 % PADS 6 each  6 each Topical Daily Corky Sing, PA-C   6 each at 06/25/20 1003  . clopidogrel (PLAVIX) tablet 75 mg  75 mg Oral Daily Amin, Ankit Chirag, MD   75 mg at 06/25/20 0959  . dextromethorphan-guaiFENesin (MUCINEX DM) 30-600 MG per 12 hr tablet 1 tablet  1 tablet Oral BID PRN Amin, Ankit Chirag, MD      . diphenhydrAMINE (BENADRYL) 12.5 MG/5ML elixir 12.5-25 mg  12.5-25 mg Oral Q4H PRN Corky Sing, PA-C      . docusate sodium (COLACE) capsule 100 mg  100 mg Oral BID PRN Amin, Ankit Chirag, MD      . enoxaparin (LOVENOX) injection 40 mg  40 mg Subcutaneous Q24H Corky Sing, PA-C   40 mg at 06/25/20 0959  . HYDROmorphone (DILAUDID) injection 0.5-1 mg  0.5-1 mg Intravenous Q4H PRN Corky Sing, PA-C   0.5 mg at 06/22/20 2010  . insulin aspart (novoLOG) injection 0-15 Units  0-15 Units Subcutaneous TID WC Corky Sing, PA-C   3 Units at 06/25/20 1320  . insulin aspart (novoLOG) injection 0-5 Units  0-5 Units Subcutaneous QHS Corky Sing, [place]   2 Units at 06/24/20 2207  . insulin aspart (novoLOG) injection 4 Units  4 Units Subcutaneous TID WC Corky Sing, PA-C   4 Units at 06/25/20 1321  . insulin glargine (LANTUS) injection 30 Units  30 Units Subcutaneous QHS Damita Lack, MD   30 Units at 06/24/20 2159  . lisinopril (ZESTRIL) tablet 20 mg  20 mg Oral QPM Amin, Ankit Chirag, MD      . loperamide (IMODIUM) capsule 2 mg  2 mg Oral QID PRN Corky Sing, PA-C      . methocarbamol (ROBAXIN) tablet 500 mg  500 mg Oral Q6H PRN Corky Sing, PA-C       Or  . methocarbamol (ROBAXIN) 500 mg in dextrose 5 % 50 mL IVPB  500 mg Intravenous Q6H PRN Corky Sing, PA-C      . multivitamin with minerals tablet 1 tablet  1 tablet Oral Daily Corky Sing, PA-C   1 tablet at 06/25/20 0370  . mupirocin ointment (BACTROBAN) 2 % 1 application  1 application Nasal BID Corky Sing, PA-C   1 application at 48/88/91 1101  . nutrition supplement (JUVEN) (JUVEN) powder packet 1 packet  1 packet Oral BID BM Corky Sing, PA-C   1 packet at 06/25/20 [phone]  . ondansetron (ZOFRAN) tablet 4 mg  4 mg Oral Q6H PRN Corky Sing, PA-C       Or  . ondansetron [facility]) injection 4 mg  4 mg Intravenous Q6H PRN Corky Sing, PA-C      .  oxyCODONE (Oxy IR/ROXICODONE) immediate release tablet 10-15 mg  10-15 mg Oral Q4H PRN Corky Sing, PA-C      . oxyCODONE (Oxy IR/ROXICODONE) immediate release tablet 5-10 mg  5-10 mg Oral Q4H PRN Corky Sing, PA-C   5 mg at 06/24/20 1854  . pantoprazole (PROTONIX) EC tablet 40 mg  40 mg Oral Daily Corky Sing, PA-C   40 mg at 06/25/20 1610  . PARoxetine (PAXIL) tablet 40 mg  40 mg Oral QHS Corky Sing, PA-C   40 mg at 06/24/20 2159  . polyethylene glycol (MIRALAX / GLYCOLAX) packet 17 g  17 g Oral Daily PRN Corky Sing, PA-C      . saccharomyces boulardii (FLORASTOR) capsule 250 mg  250 mg Oral BID Corky Sing, PA-C   250 mg at 06/25/20 9604  . senna (SENOKOT) tablet 8.6 mg  1 tablet Oral BID Corky Sing, PA-C   8.6 mg at 06/25/20 5409  . sodium chloride flush (NS) 0.9 % injection 3 mL  3 mL Intravenous Q12H Corky Sing, PA-C   3 mL at 06/25/20 1101     Discharge Medications: Please see discharge summary for a list of discharge medications.  Relevant Imaging Results:  Relevant Lab Results:   Additional Information ssn [id number]  Curlene Labrum, RN

## 2020-06-25 NOTE — Plan of Care (Signed)

## 2020-06-25 NOTE — TOC Progression Note (Signed)
Transition of Care [facility]) - Progression Note    Patient Details  Name: MAYOLA MCBAIN MRN: [id number] Date of Birth: 02-16-1932  Transition of Care [facility]) CM/SW Contact  Curlene Labrum, RN Phone Number: 06/25/2020, 2:18 PM  Clinical Narrative:    [facility] is unable to provide an available bed to the patient for admission this week and patient will need SNF placement.  I spoke with the patient and son at the bedside and they were given Medicare choice regarding SNF placement.  I started the SNF work up and called [employer] and spoke with Marlowe Kays, [facility] who started insurance authorization for the patient with a pending SNF bed choice once bed availability is ready to present to the family.  I also asked for PTAR transport in the insurance authorization as well.  Case management will continue to follow the patient for SNF placement - Case management will give the patient's son choice regarding SNF facilities.    Expected Discharge Plan: IP Rehab Facility Barriers to Discharge: Continued Medical Work up  Expected Discharge Plan and Services Expected Discharge Plan: [place]   Discharge Planning Services: CM Consult Post Acute Care Choice: IP Rehab Living arrangements for the past 2 months: Apartment Expected Discharge Date: 06/25/20                                     Social Determinants of Health (SDOH) Interventions    Readmission Risk Interventions Readmission Risk Prevention Plan 06/23/2020  Transportation Screening Complete  PCP or Specialist Appt within 3-5 Days Complete  HRI or [place] Complete  Social Work Consult for [place] Planning/Counseling Complete  Palliative Care Screening Complete  Medication Review Press photographer) Complete  Some recent data might be hidden

## 2020-06-26 LAB — BASIC METABOLIC PANEL
Anion gap: 8 (ref 5–15)
BUN: 15 mg/dL (ref 8–23)
CO2: 28 mmol/L (ref 22–32)
Calcium: 8.7 mg/dL — ABNORMAL LOW (ref 8.9–10.3)
Chloride: 104 mmol/L (ref 98–111)
Creatinine, Ser: 0.54 mg/dL (ref 0.44–1.00)
GFR, Estimated: >60 mL/min (ref >60)
Glucose, Bld: 149 mg/dL — ABNORMAL HIGH (ref 70–99)
Potassium: 3.8 mmol/L (ref 3.5–5.1)
Sodium: 140 mmol/L (ref 135–145)

## 2020-06-26 LAB — GLUCOSE, CAPILLARY
Glucose-Capillary: 156 mg/dL — ABNORMAL HIGH (ref 70–99)
Glucose-Capillary: 186 mg/dL — ABNORMAL HIGH (ref 70–99)
Glucose-Capillary: 144 mg/dL — ABNORMAL HIGH (ref 70–99)
Glucose-Capillary: 192 mg/dL — ABNORMAL HIGH (ref 70–99)

## 2020-06-26 LAB — MAGNESIUM: Magnesium: 1.9 mg/dL (ref 1.7–2.4)

## 2020-06-26 NOTE — Care Management (Signed)
Received call from [facility]. Patient a 6 day auth for [facility] SNF. Josem Kaufmann is good for a 6 day stay, Josem Kaufmann will need renewed if patient does not DC by Thursday 12/16.  #[id number] Patient has an ambulance auth that is good for 90 days.   #[id number]

## 2020-06-26 NOTE — TOC Progression Note (Addendum)
Transition of Care [facility]) - Progression Note    Patient Details  Name: [name] MRN: [id number] Date of Birth: May 17, 1932  Transition of Care [facility]) CM/SW Contact  Joanne Chars, LCSW Phone Number: 06/26/2020, 10:52 AM  Clinical Narrative:  CSW spoke with pt regarding bed offers at [facility] and [facility].  Pt has been to [facility] before and would like to accept that offer and return.  CSW LM with pt son [name]. CSW spoke with Juliann Pulse at [facility], it may be Tuesday before they have a bed.  1110: Phone call from pt son who is in agreement with accepting bed offer at [facility].    Expected Discharge Plan: IP Rehab Facility Barriers to Discharge: Continued Medical Work up  Expected Discharge Plan and Services Expected Discharge Plan: [place]   Discharge Planning Services: CM Consult Post Acute Care Choice: IP Rehab Living arrangements for the past 2 months: Apartment Expected Discharge Date: 06/25/20                                     Social Determinants of Health (SDOH) Interventions    Readmission Risk Interventions Readmission Risk Prevention Plan 06/23/2020  Transportation Screening Complete  PCP or Specialist Appt within 3-5 Days Complete  HRI or [place] Complete  Social Work Consult for [place] Planning/Counseling Complete  Palliative Care Screening Complete  Medication Review Press photographer) Complete  Some recent data might be hidden

## 2020-06-26 NOTE — Progress Notes (Signed)
PROGRESS NOTE    [name]  ZCH:[id number] DOB: 12/23/1931 DOA: 06/18/2020 PCP: Marton Redwood, MD   Brief Narrative:  86 year old with history of peripheral vascular disease, hypothyroidism, HTN, DM2, CAD, diastolic CHF, diabetic foot status post right TMA amputation 8/26 was treated outpatient with wound VAC and multiple courses of oral antibiotic with progression of right foot infection.  Now presents with osteomyelitis and abscess in this area confirmed on MRI.Started on broad-spectrum antibiotics and orthopedic was consulted but after her BKA on 12/7 antibiotics were discontinued she did well.  Stump protector was placed.  PT recommended CIR therefore arrangements made.   Assessment & Plan:   Principal Problem:   Diabetic infection of right foot ([place]) Active Problems:   Coronary artery disease involving native coronary artery of native heart   Essential hypertension   Diabetes mellitus with peripheral vascular disease (HCC)   Mixed hyperlipidemia   Chronic diastolic heart failure (HCC)     Right diabetic foot infection, with cellulitis, osteomyelitis and abscess (present on admission).  Status post right BKA 12/7 -Wound cultures from 06/17/2020 + for MRSA.  Negative blood cultures -Off Abx.  -Pain control, bowel regimen -PT OT recommending CIR.  Rest of recommendations as listed above.  Follow-up with outpatient orthopedic in 2 weeks   T2DM/ dyslipidemia.  -Accu-Cheks and sliding scale.  Long-acting Lantus   HTN Continue blood pressure control with lisinopril and carvedilol. On Lisinopril 20mg  po daily.    Chronic diastolic heart failure   No clinical signs of acute exacerbation.   Depression  Continue with paroxetine.    GERD Continue with pantoprazole.     DVT prophylaxis: enoxaparin (LOVENOX) injection 40 mg Start: 06/23/20 0800 SCDs Start: 06/22/20 1820  Code Status: DNR Family Communication:  Status is: Inpatient  Remains inpatient appropriate  because:Inpatient level of care appropriate due to severity of illness   Dispo: The patient is from: Home              Anticipated d/c is to: SNF              Anticipated d/c date is: Today              Patient currently waiting SNF placement. Medically cleared.      Body mass index is 25.44 kg/m.        Subjective:  Laying in the bed no complaints awaiting SNF.  Very appreciative for care in the hospital  Examination: Constitutional: Not in acute distress Respiratory: Clear to auscultation bilaterally Cardiovascular: Normal sinus rhythm, no rubs Abdomen: Nontender nondistended good bowel sounds Musculoskeletal: No edema noted Skin: Right lower extremity stump protector in place Neurologic: CN 2-12 grossly intact.  And nonfocal Psychiatric: Normal judgment and insight. Alert and oriented x 3. Normal mood.  Objective: Vitals:   06/25/20 1500 06/25/20 1950 06/26/20 0339 06/26/20 0747  BP: 133/65 (!) 160/52 (!) 172/60 (!) 179/65  Pulse: 64 66 64 70  Resp: 16 17 18 17   Temp: 97.9 F (36.6 C) 98 F (36.7 C) 98.1 F (36.7 C) 97.9 F (36.6 C)  TempSrc:    Oral  SpO2: 94% 96% 94% 98%  Weight:      Height:       No intake or output data in the 24 hours ending 06/26/20 0901 Filed Weights   06/18/20 1503 06/19/20 1149  Weight: 75.8 kg 75.9 kg     Data Reviewed:   CBC: Recent Labs  Lab 06/20/20 0227 06/21/20 0447 06/22/20 0514 06/23/20  0221  WBC 9.0 8.2 10.7* 12.5*  NEUTROABS  --  5.0 7.6 11.3*  HGB 8.4* 9.0* 9.0* 9.6*  HCT 26.6* 28.1* 29.7* 29.8*  MCV 84.7 84.6 86.1 84.4  PLT 405* 420* 412* 818*   Basic Metabolic Panel: Recent Labs  Lab 06/21/20 0447 06/23/20 0221 06/24/20 0317 06/25/20 0324 06/26/20 0250  NA 139 136 141 141 140  K 4.0 3.9 4.2 4.3 3.8  CL 105 104 107 103 104  CO2 25 23 27 28 28   GLUCOSE 169* 290* 148* 156* 149*  BUN 23 16 25* 20 15  CREATININE 0.63 0.64 0.76 0.55 0.54  CALCIUM 9.1 8.3* 8.7* 9.0 8.7*  MG  --   --  1.8 1.9  1.9   GFR: Estimated Creatinine Clearance: 51.9 mL/min (by C-G formula based on SCr of 0.54 mg/dL). Liver Function Tests: No results for input(s): AST, ALT, ALKPHOS, BILITOT, PROT, ALBUMIN in the last 168 hours. No results for input(s): LIPASE, AMYLASE in the last 168 hours. No results for input(s): AMMONIA in the last 168 hours. Coagulation Profile: No results for input(s): INR, PROTIME in the last 168 hours. Cardiac Enzymes: No results for input(s): CKTOTAL, CKMB, CKMBINDEX, TROPONINI in the last 168 hours. BNP (last 3 results) No results for input(s): PROBNP in the last 8760 hours. HbA1C: No results for input(s): HGBA1C in the last 72 hours. CBG: Recent Labs  Lab 06/25/20 0708 06/25/20 1314 06/25/20 1543 06/25/20 1949 06/26/20 0646  GLUCAP 163* 187* 124* 142* 156*   Lipid Profile: No results for input(s): CHOL, HDL, LDLCALC, TRIG, CHOLHDL, LDLDIRECT in the last 72 hours. Thyroid Function Tests: No results for input(s): TSH, T4TOTAL, FREET4, T3FREE, THYROIDAB in the last 72 hours. Anemia Panel: No results for input(s): VITAMINB12, FOLATE, FERRITIN, TIBC, IRON, RETICCTPCT in the last 72 hours. Sepsis Labs: No results for input(s): PROCALCITON, LATICACIDVEN in the last 168 hours.  Recent Results (from the past 240 hour(s))  Aerobic Culture  (superficial specimen)     Status: None   Collection Time: 06/17/20  9:20 AM   Specimen: Wound  Result Value Ref Range Status   Specimen Description   Final    WOUND SITE NOT SPECIFIED Performed at [place] [address] [place], [place] [address]    Special Requests   Final    NONE Performed at [facility], [place] [address], [place], [place] [address]    Gram Stain NO WBC SEEN NO ORGANISMS SEEN   Final   Culture   Final    RARE METHICILLIN RESISTANT STAPHYLOCOCCUS AUREUS RARE CORYNEBACTERIUM STRIATUM Standardized susceptibility testing for this organism is not available. Performed  at [facility], [place] [address] [place], [place] [address]    Report Status 06/22/2020 FINAL  Final   Organism ID, Bacteria METHICILLIN RESISTANT STAPHYLOCOCCUS AUREUS  Final      Susceptibility   Methicillin resistant staphylococcus aureus - MIC*    CIPROFLOXACIN <=0.5 SENSITIVE Sensitive     ERYTHROMYCIN <=0.25 SENSITIVE Sensitive     GENTAMICIN <=0.5 SENSITIVE Sensitive     OXACILLIN RESISTANT Resistant     TETRACYCLINE >=16 RESISTANT Resistant     VANCOMYCIN 1 SENSITIVE Sensitive     TRIMETH/SULFA <=10 SENSITIVE Sensitive     CLINDAMYCIN <=0.25 SENSITIVE Sensitive     RIFAMPIN <=0.5 SENSITIVE Sensitive     Inducible Clindamycin NEGATIVE Sensitive     * RARE METHICILLIN RESISTANT STAPHYLOCOCCUS AUREUS  Resp Panel by RT-PCR (Flu A&B, Covid) Nasopharyngeal Swab  Status: None   Collection Time: 06/19/20  6:45 AM   Specimen: Nasopharyngeal Swab; Nasopharyngeal(NP) swabs in vial transport medium  Result Value Ref Range Status   SARS Coronavirus 2 by RT PCR NEGATIVE NEGATIVE Final    Comment: (NOTE) SARS-CoV-2 target nucleic acids are NOT DETECTED.  The SARS-CoV-2 RNA is generally detectable in upper respiratory specimens during the acute phase of infection. The lowest concentration of SARS-CoV-2 viral copies this assay can detect is 138 copies/mL. A negative result does not preclude SARS-Cov-2 infection and should not be used as the sole basis for treatment or other patient management decisions. A negative result may occur with  improper specimen collection/handling, submission of specimen other than nasopharyngeal swab, presence of viral mutation(s) within the areas targeted by this assay, and inadequate number of viral copies(<138 copies/mL). A negative result must be combined with clinical observations, patient history, and epidemiological information. The expected result is Negative.  Fact Sheet for Patients:  EntrepreneurPulse.com.au  Fact Sheet  for Healthcare Providers:  IncredibleEmployment.be  This test is no t yet approved or cleared by the [place] FDA and  has been authorized for detection and/or diagnosis of SARS-CoV-2 by FDA under an Emergency Use Authorization (EUA). This EUA will remain  in effect (meaning this test can be used) for the duration of the COVID-19 declaration under Section 564(b)(1) of the Act, 21 U.S.C.section 360bbb-3(b)(1), unless the authorization is terminated  or revoked sooner.       Influenza A by PCR NEGATIVE NEGATIVE Final   Influenza B by PCR NEGATIVE NEGATIVE Final    Comment: (NOTE) The Xpert Xpress SARS-CoV-2/FLU/RSV plus assay is intended as an aid in the diagnosis of influenza from Nasopharyngeal swab specimens and should not be used as a sole basis for treatment. Nasal washings and aspirates are unacceptable for Xpert Xpress SARS-CoV-2/FLU/RSV testing.  Fact Sheet for Patients: EntrepreneurPulse.com.au  Fact Sheet for Healthcare Providers: IncredibleEmployment.be  This test is not yet approved or cleared by the [place] FDA and has been authorized for detection and/or diagnosis of SARS-CoV-2 by FDA under an Emergency Use Authorization (EUA). This EUA will remain in effect (meaning this test can be used) for the duration of the COVID-19 declaration under Section 564(b)(1) of the Act, 21 U.S.C. section 360bbb-3(b)(1), unless the authorization is terminated or revoked.  Performed at [facility], [place] [address], [place], [place] [address]   Culture, blood (routine x 2)     Status: None   Collection Time: 06/19/20  7:35 AM   Specimen: BLOOD  Result Value Ref Range Status   Specimen Description BLOOD SITE NOT SPECIFIED  Final   Special Requests   Final    BOTTLES DRAWN AEROBIC AND ANAEROBIC Blood Culture results may not be optimal due to an excessive volume of blood received in culture bottles   Culture    Final    NO GROWTH 5 DAYS Performed at [facility], [place] [address] [place], [place] [address]    Report Status 06/24/2020 FINAL  Final  Culture, blood (routine x 2)     Status: None   Collection Time: 06/19/20  7:40 AM   Specimen: BLOOD  Result Value Ref Range Status   Specimen Description BLOOD SITE NOT SPECIFIED  Final   Special Requests   Final    BOTTLES DRAWN AEROBIC AND ANAEROBIC Blood Culture adequate volume   Culture   Final    NO GROWTH 5 DAYS Performed at [facility], [address] [place], [place] [address]    Report Status 06/24/2020 FINAL  Final  Surgical pcr screen     Status: Abnormal   Collection Time: 06/22/20 12:14 AM   Specimen: Nasal Mucosa; Nasal Swab  Result Value Ref Range Status   MRSA, PCR NEGATIVE NEGATIVE Final   Staphylococcus aureus POSITIVE (A) NEGATIVE Final    Comment: CRITICAL RESULT CALLED TO, READ BACK BY AND VERIFIED WITH: RN Tinnie Gens [phone] @0223  THANEY Performed at [facility], [place] [address], [place], [place] [address]          Radiology Studies: No results found.      Scheduled Meds:  (feeding supplement) PROSource Plus  30 mL Oral BID BM   atorvastatin  20 mg Oral QPM   bismuth subsalicylate  30 mL Oral TID AC & HS   carvedilol  25 mg Oral BID WC   Chlorhexidine Gluconate Cloth  6 each Topical Daily   clopidogrel  75 mg Oral Daily   enoxaparin (LOVENOX) injection  40 mg Subcutaneous Q24H   insulin aspart  0-15 Units Subcutaneous TID WC   insulin aspart  0-5 Units Subcutaneous QHS   insulin aspart  4 Units Subcutaneous TID WC   insulin glargine  30 Units Subcutaneous QHS   lisinopril  20 mg Oral QPM   multivitamin with minerals  1 tablet Oral Daily   mupirocin ointment  1 application Nasal BID   nutrition supplement (JUVEN)  1 packet Oral BID BM   pantoprazole  40 mg Oral Daily   PARoxetine  40 mg Oral QHS   saccharomyces boulardii  250 mg Oral BID   senna  1 tablet Oral BID   sodium  chloride flush  3 mL Intravenous Q12H   Continuous Infusions:  sodium chloride 75 mL/hr at 06/22/20 1840   methocarbamol (ROBAXIN) IV       LOS: 7 days   Time spent= 15 mins    Chon Buhl Arsenio Loader, MD [facility]  If 7PM-7AM, please contact night-coverage  06/26/2020, 9:01 AM

## 2020-06-27 LAB — BASIC METABOLIC PANEL
Anion gap: 9 (ref 5–15)
BUN: 16 mg/dL (ref 8–23)
CO2: 27 mmol/L (ref 22–32)
Calcium: 8.5 mg/dL — ABNORMAL LOW (ref 8.9–10.3)
Chloride: 104 mmol/L (ref 98–111)
Creatinine, Ser: 0.62 mg/dL (ref 0.44–1.00)
GFR, Estimated: >60 mL/min (ref >60)
Glucose, Bld: 149 mg/dL — ABNORMAL HIGH (ref 70–99)
Potassium: 3.4 mmol/L — ABNORMAL LOW (ref 3.5–5.1)
Sodium: 140 mmol/L (ref 135–145)

## 2020-06-27 LAB — MAGNESIUM: Magnesium: 2.1 mg/dL (ref 1.7–2.4)

## 2020-06-27 LAB — GLUCOSE, CAPILLARY
Glucose-Capillary: 117 mg/dL — ABNORMAL HIGH (ref 70–99)
Glucose-Capillary: 214 mg/dL — ABNORMAL HIGH (ref 70–99)
Glucose-Capillary: 127 mg/dL — ABNORMAL HIGH (ref 70–99)
Glucose-Capillary: 197 mg/dL — ABNORMAL HIGH (ref 70–99)

## 2020-06-27 MED ORDER — NITROGLYCERIN 0.4 MG SL SUBL
SUBLINGUAL_TABLET | SUBLINGUAL | Status: AC
  Filled 2020-06-27: qty 1

## 2020-06-27 MED ORDER — POLYETHYLENE GLYCOL 3350 17 G PO PACK: 17.0000 g | PACK | Freq: Every day | ORAL | Status: DC | PRN

## 2020-06-27 MED ORDER — POTASSIUM CHLORIDE CRYS ER 20 MEQ PO TBCR
40.0000 meq | EXTENDED_RELEASE_TABLET | Freq: Once | ORAL | Status: AC
  Administered 2020-06-27: 40 meq via ORAL
  Filled 2020-06-27: qty 2

## 2020-06-27 NOTE — Plan of Care (Signed)

## 2020-06-27 NOTE — Progress Notes (Signed)
PROGRESS NOTE    [name]  WCH:[id number] DOB: 1931/10/28 DOA: 06/18/2020 PCP: Marton Redwood, MD   Brief Narrative:  86 year old with history of peripheral vascular disease, hypothyroidism, HTN, DM2, CAD, diastolic CHF, diabetic foot status post right TMA amputation 8/26 was treated outpatient with wound VAC and multiple courses of oral antibiotic with progression of right foot infection.  Now presents with osteomyelitis and abscess in this area confirmed on MRI.Started on broad-spectrum antibiotics and orthopedic was consulted but after her BKA on 12/7 antibiotics were discontinued she did well.  Stump protector was placed.  PT recommended CIR which was denied therefore awaiting SNF placement.   Assessment & Plan:   Principal Problem:   Diabetic infection of right foot ([place]) Active Problems:   Coronary artery disease involving native coronary artery of native heart   Essential hypertension   Diabetes mellitus with peripheral vascular disease (HCC)   Mixed hyperlipidemia   Chronic diastolic heart failure (HCC)     Right diabetic foot infection, with cellulitis, osteomyelitis and abscess (present on admission).  Status post right BKA 12/7 -Wound cultures from 06/17/2020 + for MRSA.  Negative blood cultures. Off Abx.  -Pain control, bowel regimen -PT OT recommending CIR but denied therefore will be going to SNF.  Rest of recommendations as listed above.  Follow-up with outpatient orthopedic in 2 weeks   T2DM/ dyslipidemia.  -Accu-Cheks and sliding scale.  Long-acting Lantus   HTN Continue blood pressure control with lisinopril and carvedilol. On Lisinopril 20mg  po daily.    Chronic diastolic heart failure   No clinical signs of acute exacerbation.   Depression  Continue with paroxetine.    GERD Continue with pantoprazole.     DVT prophylaxis: enoxaparin (LOVENOX) injection 40 mg Start: 06/23/20 0800 SCDs Start: 06/22/20 1820  Code Status: DNR Family Communication:  Son at bedside  Status is: Inpatient  Remains inpatient appropriate because:Inpatient level of care appropriate due to severity of illness   Dispo: The patient is from: Home              Anticipated d/c is to: SNF              Anticipated d/c date is: Today              Patient currently waiting SNF placement. Medically cleared.      Body mass index is 25.44 kg/m.        Subjective: Patient feels okay no complaints.  Tells me her pain is well controlled   Examination:  Constitutional: Not in acute distress Respiratory: Clear to auscultation bilaterally Cardiovascular: Normal sinus rhythm, no rubs Abdomen: Nontender nondistended good bowel sounds Musculoskeletal: No edema noted Skin: Right lower extremity dressing noted with stump protector in place Neurologic: CN 2-12 grossly intact.  And nonfocal Psychiatric: Normal judgment and insight. Alert and oriented x 3. Normal mood. Objective: Vitals:   06/26/20 1459 06/26/20 2014 06/27/20 0500 06/27/20 0746  BP: (!) 143/56 (!) 152/75 (!) 155/78 (!) 164/59  Pulse: 64 86 80 72  Resp: 17 16  17   Temp: 98.2 F (36.8 C) 98.8 F (37.1 C) 98.4 F (36.9 C) 98.2 F (36.8 C)  TempSrc: Oral  Oral Oral  SpO2: 96% 97% 98% 95%  Weight:      Height:        Intake/Output Summary (Last 24 hours) at 06/27/2020 0840 Last data filed at 06/27/2020 0500 Gross per 24 hour  Intake 560 ml  Output --  Net 560 ml  Filed Weights   06/18/20 1503 06/19/20 1149  Weight: 75.8 kg 75.9 kg     Data Reviewed:   CBC: Recent Labs  Lab 06/21/20 0447 06/22/20 0514 06/23/20 0221  WBC 8.2 10.7* 12.5*  NEUTROABS 5.0 7.6 11.3*  HGB 9.0* 9.0* 9.6*  HCT 28.1* 29.7* 29.8*  MCV 84.6 86.1 84.4  PLT 420* 412* 268*   Basic Metabolic Panel: Recent Labs  Lab 06/23/20 0221 06/24/20 0317 06/25/20 0324 06/26/20 0250 06/27/20 0357  NA 136 141 141 140 140  K 3.9 4.2 4.3 3.8 3.4*  CL 104 107 103 104 104  CO2 23 27 28 28 27   GLUCOSE 290*  148* 156* 149* 149*  BUN 16 25* 20 15 16   CREATININE 0.64 0.76 0.55 0.54 0.62  CALCIUM 8.3* 8.7* 9.0 8.7* 8.5*  MG  --  1.8 1.9 1.9 2.1   GFR: Estimated Creatinine Clearance: 51.9 mL/min (by C-G formula based on SCr of 0.62 mg/dL). Liver Function Tests: No results for input(s): AST, ALT, ALKPHOS, BILITOT, PROT, ALBUMIN in the last 168 hours. No results for input(s): LIPASE, AMYLASE in the last 168 hours. No results for input(s): AMMONIA in the last 168 hours. Coagulation Profile: No results for input(s): INR, PROTIME in the last 168 hours. Cardiac Enzymes: No results for input(s): CKTOTAL, CKMB, CKMBINDEX, TROPONINI in the last 168 hours. BNP (last 3 results) No results for input(s): PROBNP in the last 8760 hours. HbA1C: No results for input(s): HGBA1C in the last 72 hours. CBG: Recent Labs  Lab 06/26/20 0646 06/26/20 1116 06/26/20 1603 06/26/20 2016 06/27/20 0658  GLUCAP 156* 186* 144* 192* 117*   Lipid Profile: No results for input(s): CHOL, HDL, LDLCALC, TRIG, CHOLHDL, LDLDIRECT in the last 72 hours. Thyroid Function Tests: No results for input(s): TSH, T4TOTAL, FREET4, T3FREE, THYROIDAB in the last 72 hours. Anemia Panel: No results for input(s): VITAMINB12, FOLATE, FERRITIN, TIBC, IRON, RETICCTPCT in the last 72 hours. Sepsis Labs: No results for input(s): PROCALCITON, LATICACIDVEN in the last 168 hours.  Recent Results (from the past 240 hour(s))  Aerobic Culture (superficial specimen)     Status: None   Collection Time: 06/17/20  9:20 AM   Specimen: Wound  Result Value Ref Range Status   Specimen Description   Final    WOUND SITE NOT SPECIFIED Performed at [facility] [address], [place], [place] [address]    Special Requests   Final    NONE Performed at [facility], [place] [address] [place], [place] [address]    Gram Stain NO WBC SEEN NO ORGANISMS SEEN   Final   Culture   Final    RARE METHICILLIN RESISTANT  STAPHYLOCOCCUS AUREUS RARE CORYNEBACTERIUM STRIATUM Standardized susceptibility testing for this organism is not available. Performed at [facility], [place] [address] [place], [place] [address]    Report Status 06/22/2020 FINAL  Final   Organism ID, Bacteria METHICILLIN RESISTANT STAPHYLOCOCCUS AUREUS  Final      Susceptibility   Methicillin resistant staphylococcus aureus - MIC*    CIPROFLOXACIN <=0.5 SENSITIVE Sensitive     ERYTHROMYCIN <=0.25 SENSITIVE Sensitive     GENTAMICIN <=0.5 SENSITIVE Sensitive     OXACILLIN RESISTANT Resistant     TETRACYCLINE >=16 RESISTANT Resistant     VANCOMYCIN 1 SENSITIVE Sensitive     TRIMETH/SULFA <=10 SENSITIVE Sensitive     CLINDAMYCIN <=0.25 SENSITIVE Sensitive     RIFAMPIN <=0.5 SENSITIVE Sensitive     Inducible Clindamycin NEGATIVE Sensitive     *  RARE METHICILLIN RESISTANT STAPHYLOCOCCUS AUREUS  Resp Panel by RT-PCR (Flu A&B, Covid) Nasopharyngeal Swab     Status: None   Collection Time: 06/19/20  6:45 AM   Specimen: Nasopharyngeal Swab; Nasopharyngeal(NP) swabs in vial transport medium  Result Value Ref Range Status   SARS Coronavirus 2 by RT PCR NEGATIVE NEGATIVE Final    Comment: (NOTE) SARS-CoV-2 target nucleic acids are NOT DETECTED.  The SARS-CoV-2 RNA is generally detectable in upper respiratory specimens during the acute phase of infection. The lowest concentration of SARS-CoV-2 viral copies this assay can detect is 138 copies/mL. A negative result does not preclude SARS-Cov-2 infection and should not be used as the sole basis for treatment or other patient management decisions. A negative result may occur with  improper specimen collection/handling, submission of specimen other than nasopharyngeal swab, presence of viral mutation(s) within the areas targeted by this assay, and inadequate number of viral copies(<138 copies/mL). A negative result must be combined with clinical observations, patient history, and  epidemiological information. The expected result is Negative.  Fact Sheet for Patients:  EntrepreneurPulse.com.au  Fact Sheet for Healthcare Providers:  IncredibleEmployment.be  This test is no t yet approved or cleared by the [place] FDA and  has been authorized for detection and/or diagnosis of SARS-CoV-2 by FDA under an Emergency Use Authorization (EUA). This EUA will remain  in effect (meaning this test can be used) for the duration of the COVID-19 declaration under Section 564(b)(1) of the Act, 21 U.S.C.section 360bbb-3(b)(1), unless the authorization is terminated  or revoked sooner.       Influenza A by PCR NEGATIVE NEGATIVE Final   Influenza B by PCR NEGATIVE NEGATIVE Final    Comment: (NOTE) The Xpert Xpress SARS-CoV-2/FLU/RSV plus assay is intended as an aid in the diagnosis of influenza from Nasopharyngeal swab specimens and should not be used as a sole basis for treatment. Nasal washings and aspirates are unacceptable for Xpert Xpress SARS-CoV-2/FLU/RSV testing.  Fact Sheet for Patients: EntrepreneurPulse.com.au  Fact Sheet for Healthcare Providers: IncredibleEmployment.be  This test is not yet approved or cleared by the [place] FDA and has been authorized for detection and/or diagnosis of SARS-CoV-2 by FDA under an Emergency Use Authorization (EUA). This EUA will remain in effect (meaning this test can be used) for the duration of the COVID-19 declaration under Section 564(b)(1) of the Act, 21 U.S.C. section 360bbb-3(b)(1), unless the authorization is terminated or revoked.  Performed at [facility], [place] [address], [place], [place] [address]   Culture, blood (routine x 2)     Status: None   Collection Time: 06/19/20  7:35 AM   Specimen: BLOOD  Result Value Ref Range Status   Specimen Description BLOOD SITE NOT SPECIFIED  Final   Special Requests   Final    BOTTLES  DRAWN AEROBIC AND ANAEROBIC Blood Culture results may not be optimal due to an excessive volume of blood received in culture bottles   Culture   Final    NO GROWTH 5 DAYS Performed at [facility], [place] [address] [place], [place] [address]    Report Status 06/24/2020 FINAL  Final  Culture, blood (routine x 2)     Status: None   Collection Time: 06/19/20  7:40 AM   Specimen: BLOOD  Result Value Ref Range Status   Specimen Description BLOOD SITE NOT SPECIFIED  Final   Special Requests   Final    BOTTLES DRAWN AEROBIC AND ANAEROBIC Blood Culture adequate volume  Culture   Final    NO GROWTH 5 DAYS Performed at [facility], [place] [address], [place], [place] [address]    Report Status 06/24/2020 FINAL  Final  Surgical pcr screen     Status: Abnormal   Collection Time: 06/22/20 12:14 AM   Specimen: Nasal Mucosa; Nasal Swab  Result Value Ref Range Status   MRSA, PCR NEGATIVE NEGATIVE Final   Staphylococcus aureus POSITIVE (A) NEGATIVE Final    Comment: CRITICAL RESULT CALLED TO, READ BACK BY AND VERIFIED WITH: RN Tinnie Gens [phone] @[phone]  [facility] Performed at [facility], [place] [address] [place],  [address]          Radiology Studies: No results found.      Scheduled Meds: . (feeding supplement) PROSource Plus  30 mL Oral BID BM  . atorvastatin  20 mg Oral QPM  . bismuth subsalicylate  30 mL Oral TID AC & HS  . carvedilol  25 mg Oral BID WC  . clopidogrel  75 mg Oral Daily  . enoxaparin (LOVENOX) injection  40 mg Subcutaneous Q24H  . insulin aspart  0-15 Units Subcutaneous TID WC  . insulin aspart  0-5 Units Subcutaneous QHS  . insulin aspart  4 Units Subcutaneous TID WC  . insulin glargine  30 Units Subcutaneous QHS  . lisinopril  20 mg Oral QPM  . multivitamin with minerals  1 tablet Oral Daily  . nutrition supplement (JUVEN)  1 packet Oral BID BM  . pantoprazole  40 mg Oral Daily  . PARoxetine  40 mg Oral QHS  . potassium chloride  40 mEq  Oral Once  . saccharomyces boulardii  250 mg Oral BID  . senna  1 tablet Oral BID  . sodium chloride flush  3 mL Intravenous Q12H   Continuous Infusions: . sodium chloride 75 mL/hr at 06/22/20 1840  . methocarbamol (ROBAXIN) IV       LOS: 8 days   Time spent= 15 mins    Debroh Sieloff Arsenio Loader, MD [facility]  If 7PM-7AM, please contact night-coverage  06/27/2020, 8:40 AM

## 2020-06-28 DIAGNOSIS — I10 Essential (primary) hypertension: Secondary | ICD-10-CM | POA: Diagnosis not present

## 2020-06-28 DIAGNOSIS — I739 Peripheral vascular disease, unspecified: Secondary | ICD-10-CM | POA: Diagnosis not present

## 2020-06-28 DIAGNOSIS — M2550 Pain in unspecified joint: Secondary | ICD-10-CM | POA: Diagnosis not present

## 2020-06-28 DIAGNOSIS — Z89511 Acquired absence of right leg below knee: Secondary | ICD-10-CM | POA: Diagnosis not present

## 2020-06-28 DIAGNOSIS — E11621 Type 2 diabetes mellitus with foot ulcer: Secondary | ICD-10-CM | POA: Diagnosis not present

## 2020-06-28 DIAGNOSIS — I2510 Atherosclerotic heart disease of native coronary artery without angina pectoris: Secondary | ICD-10-CM | POA: Diagnosis not present

## 2020-06-28 DIAGNOSIS — T8131XA Disruption of external operation (surgical) wound, not elsewhere classified, initial encounter: Secondary | ICD-10-CM | POA: Diagnosis not present

## 2020-06-28 DIAGNOSIS — E1169 Type 2 diabetes mellitus with other specified complication: Secondary | ICD-10-CM | POA: Diagnosis not present

## 2020-06-28 DIAGNOSIS — E782 Mixed hyperlipidemia: Secondary | ICD-10-CM | POA: Diagnosis not present

## 2020-06-28 DIAGNOSIS — J9601 Acute respiratory failure with hypoxia: Secondary | ICD-10-CM | POA: Diagnosis not present

## 2020-06-28 DIAGNOSIS — E049 Nontoxic goiter, unspecified: Secondary | ICD-10-CM | POA: Diagnosis not present

## 2020-06-28 DIAGNOSIS — E118 Type 2 diabetes mellitus with unspecified complications: Secondary | ICD-10-CM | POA: Diagnosis not present

## 2020-06-28 DIAGNOSIS — K219 Gastro-esophageal reflux disease without esophagitis: Secondary | ICD-10-CM | POA: Diagnosis not present

## 2020-06-28 DIAGNOSIS — Z7401 Bed confinement status: Secondary | ICD-10-CM | POA: Diagnosis not present

## 2020-06-28 DIAGNOSIS — R262 Difficulty in walking, not elsewhere classified: Secondary | ICD-10-CM | POA: Diagnosis not present

## 2020-06-28 DIAGNOSIS — M6281 Muscle weakness (generalized): Secondary | ICD-10-CM | POA: Diagnosis not present

## 2020-06-28 DIAGNOSIS — F339 Major depressive disorder, recurrent, unspecified: Secondary | ICD-10-CM | POA: Diagnosis not present

## 2020-06-28 DIAGNOSIS — I5032 Chronic diastolic (congestive) heart failure: Secondary | ICD-10-CM | POA: Diagnosis not present

## 2020-06-28 DIAGNOSIS — Z4781 Encounter for orthopedic aftercare following surgical amputation: Secondary | ICD-10-CM | POA: Diagnosis not present

## 2020-06-28 DIAGNOSIS — R5381 Other malaise: Secondary | ICD-10-CM | POA: Diagnosis not present

## 2020-06-28 DIAGNOSIS — E11628 Type 2 diabetes mellitus with other skin complications: Secondary | ICD-10-CM | POA: Diagnosis not present

## 2020-06-28 LAB — BASIC METABOLIC PANEL
Anion gap: 9 (ref 5–15)
BUN: 22 mg/dL (ref 8–23)
CO2: 26 mmol/L (ref 22–32)
Calcium: 8.7 mg/dL — ABNORMAL LOW (ref 8.9–10.3)
Chloride: 105 mmol/L (ref 98–111)
Creatinine, Ser: 0.53 mg/dL (ref 0.44–1.00)
GFR, Estimated: >60 mL/min (ref >60)
Glucose, Bld: 177 mg/dL — ABNORMAL HIGH (ref 70–99)
Potassium: 3.5 mmol/L (ref 3.5–5.1)
Sodium: 140 mmol/L (ref 135–145)

## 2020-06-28 LAB — GLUCOSE, CAPILLARY
Glucose-Capillary: 129 mg/dL — ABNORMAL HIGH (ref 70–99)
Glucose-Capillary: 145 mg/dL — ABNORMAL HIGH (ref 70–99)
Glucose-Capillary: 190 mg/dL — ABNORMAL HIGH (ref 70–99)
Glucose-Capillary: 136 mg/dL — ABNORMAL HIGH (ref 70–99)
Glucose-Capillary: 140 mg/dL — ABNORMAL HIGH (ref 70–99)

## 2020-06-28 LAB — MAGNESIUM: Magnesium: 1.9 mg/dL (ref 1.7–2.4)

## 2020-06-28 LAB — RESP PANEL BY RT-PCR (FLU A&B, COVID) ARPGX2
Influenza A by PCR: NEGATIVE
Influenza B by PCR: NEGATIVE
SARS Coronavirus 2 by RT PCR: NEGATIVE

## 2020-06-28 MED ORDER — POTASSIUM CHLORIDE CRYS ER 20 MEQ PO TBCR
40.0000 meq | EXTENDED_RELEASE_TABLET | Freq: Once | ORAL | Status: AC
  Administered 2020-06-28: 40 meq via ORAL
  Filled 2020-06-28: qty 2

## 2020-06-28 MED ORDER — AMLODIPINE BESYLATE 5 MG PO TABS
5.0000 mg | ORAL_TABLET | Freq: Every day | ORAL | Status: DC
  Administered 2020-06-28: 5 mg via ORAL
  Filled 2020-06-28: qty 1

## 2020-06-28 NOTE — Progress Notes (Signed)
Report Given to [facility].   Patient discharge teaching given, including activity, diet, follow-up appoints, and medications. Patient verbalized understanding of all discharge instructions. IV access was d/c'd. Vitals are stable. Skin is intact except as charted in most recent assessments. Pt to be escorted out by PTAR.

## 2020-06-28 NOTE — Plan of Care (Signed)

## 2020-06-28 NOTE — Progress Notes (Signed)
No acute events overnight.  Tells me her pain is well controlled.  Vital signs remained stable.  Currently awaiting bed at skilled nursing facility.  Discussed with TOC team.  Discharge summary completed updated today.  Gerlean Ren MD [facility]

## 2020-06-28 NOTE — Care Management Important Message (Signed)
Important Message  Patient Details  Name: [name] MRN: [id number] Date of Birth: Feb 27, 1932   Medicare Important Message Given:  Yes - Important Message mailed due to current National Emergency   Verbal consent obtained due to current National Emergency  Relationship to patient: Self Contact Name: Shemicka Cohrs Call Date: 06/28/20  Time: 1453 Phone: [phone] Outcome: Spoke with contact Important Message mailed to: Patient address on file    Delorse Lek 06/28/2020, 2:53 PM

## 2020-06-28 NOTE — Progress Notes (Signed)
Physical Therapy Treatment Patient Details Name: [name] MRN: [id number] DOB: 1932/05/20 Today's Date: 06/28/2020    History of Present Illness [name] is a 86 y.o. female with medical history significant of PVD; hypothyroidism; HTN; DM; CAD; and chronic combined CHF presenting with a foot infection. She had a R TMA on 8/26 that was not healing well. Did not improve with antibiotics so pt underwent R BKA on 12/7.    PT Comments    Pt supine on arrival, agreeable to therapy session and very motivated to progress functional mobility. Pt performed sit<>stand x5 reps total with modA from bed/BSC heights to RW, with limited carryover of cues for UE placement and will need reinforcement. Reviewed seated/standing therex and pt performed with AROM, pt with decreased standing balance and needs min/modA for support during therex. Pt Supervision at most with bed mobility. Pt continues to benefit from skilled rehab in a post acute setting to maximize functional gains before returning home.  Follow Up Recommendations  CIR;Supervision for mobility/OOB (SNF if CIR bed unavailable)     Equipment Recommendations  Rolling walker with 5" wheels;3in1 (PT);Wheelchair (measurements PT);Wheelchair cushion (measurements PT);Other (comment)    Recommendations for Other Services       Precautions / Restrictions Precautions Precautions: Fall Required Braces or Orthoses: Other Brace Other Brace: limb protector Restrictions Weight Bearing Restrictions: Yes RLE Weight Bearing: Non weight bearing    Mobility  Bed Mobility Overal bed mobility: Needs Assistance Bed Mobility: Supine to Sit;Sit to Supine Rolling: Supervision   Supine to sit: Supervision;HOB elevated Sit to supine: Supervision   General bed mobility comments: pt able to maneuver blankets on her own, min cues for technique and bed rail use  Transfers Overall transfer level: Needs assistance Equipment used: Rolling walker (2  wheeled) Transfers: Sit to/from [facility] Sit to Stand: Mod assist Stand pivot transfers: Mod assist       General transfer comment: pt with impaired balance when attempting to pivot. Pt is able to shuffle foot some during session pivoting to/from [facility] and able to hop x3 reps with rest breaks x3 trials toward [facility]  Ambulation/Gait             General Gait Details: 3-4 hops toward HOB using RW and modA   Stairs             Wheelchair Mobility    Modified Rankin (Stroke Patients Only)       Balance Overall balance assessment: Needs assistance Sitting-balance support: No upper extremity supported;Feet supported Sitting balance-Leahy Scale: Good Sitting balance - Comments: static sitting and weight shifting no LOB   Standing balance support: Bilateral upper extremity supported Standing balance-Leahy Scale: Poor Standing balance comment: reliant on UE support of RW and min/modA external assist                            Cognition Arousal/Alertness: Awake/alert Behavior During Therapy: Anxious Overall Cognitive Status: Within Functional Limits for tasks assessed Area of Impairment: Problem solving                             Problem Solving: Difficulty sequencing;Requires verbal cues General Comments: pt needs cues for safe problem solving/assistive device use and sometimes needs repetition of cues for sequencing; motivated, agreeable      Exercises Amputee Exercises Hip ABduction/ADduction: AROM;Strengthening;Right;10 reps;Standing Hip Flexion/Marching: AROM;Strengthening;10 reps;Standing;Right Knee Extension: AROM;Strengthening;Right;10 reps;Seated Other  Exercises Other Exercises: STS x 4 reps for strengthening    General Comments General comments (skin integrity, edema, etc.): verbal review of healing times, proper positioning in bed and not to put pillow under knee, pt may need reinforcement      Pertinent  Vitals/Pain Pain Assessment: Faces Faces Pain Scale: Hurts a little bit Pain Location: R residual limb Pain Descriptors / Indicators: Grimacing;Guarding Pain Intervention(s): Monitored during session;Repositioned    Home Living                      Prior Function            PT Goals (current goals can now be found in the care plan section) Acute Rehab PT Goals Patient Stated Goal: be able to regain independence, be able to get prosthetic and walk normally again PT Goal Formulation: With patient Time For Goal Achievement: 07/07/20 Potential to Achieve Goals: Good    Frequency    Min 4X/week      PT Plan Current plan remains appropriate    Co-evaluation              AM-PAC PT "6 Clicks" Mobility   Outcome Measure  Help needed turning from your back to your side while in a flat bed without using bedrails?: A Little Help needed moving from lying on your back to sitting on the side of a flat bed without using bedrails?: A Little Help needed moving to and from a bed to a chair (including a wheelchair)?: A Lot Help needed standing up from a chair using your arms (e.g., wheelchair or bedside chair)?: A Lot Help needed to walk in hospital room?: A Lot Help needed climbing 3-5 steps with a railing? : Total 6 Click Score: 13    End of Session Equipment Utilized During Treatment: Gait belt Activity Tolerance: Patient tolerated treatment well Patient left: in bed;with call bell/phone within reach;with bed alarm set (pt preparing for transport) Nurse Communication: Mobility status PT Visit Diagnosis: Unsteadiness on feet (R26.81);Other abnormalities of gait and mobility (R26.89);Dizziness and giddiness (R42)     Time: 2353-6144 PT Time Calculation (min) (ACUTE ONLY): 19 min  Charges:  $Therapeutic Exercise: 8-22 mins                     Lavita Pontius P., PTA Acute Rehabilitation Services Pager: [phone] Office: [place] 06/28/2020, 4:31  PM

## 2020-06-28 NOTE — TOC Progression Note (Signed)
Transition of Care [facility]) - Progression Note    Patient Details  Name: REGHAN THUL MRN: [id number] Date of Birth: [birth year]  Transition of Care [facility]) CM/SW [place], RN Phone Number: 06/28/2020, 9:56 AM  Clinical Narrative:    Case management called and left a message with Juliann Pulse, [facility] with [facility] to find out about bed availabilitiy today or tomorrow for admission.  The patient was approved by Insurance for admission and PTAR transport - just waiting on bed availability.  Will continue to follow for admission.   Expected Discharge Plan: IP Rehab Facility Barriers to Discharge: Continued Medical Work up  Expected Discharge Plan and Services Expected Discharge Plan: [place]   Discharge Planning Services: CM Consult Post Acute Care Choice: IP Rehab Living arrangements for the past 2 months: Apartment Expected Discharge Date: 06/25/20                                     Social Determinants of Health (SDOH) Interventions    Readmission Risk Interventions Readmission Risk Prevention Plan 06/23/2020  Transportation Screening Complete  PCP or Specialist Appt within 3-5 Days Complete  HRI or [place] Complete  Social Work Consult for [place] Planning/Counseling Complete  Palliative Care Screening Complete  Medication Review Press photographer) Complete  Some recent data might be hidden

## 2020-06-28 NOTE — TOC Transition Note (Signed)
Transition of Care [facility]) - CM/SW Discharge Note   Patient Details  Name: RANDILYN FOISY MRN: [id number] Date of Birth: 02-02-1932  Transition of Care [facility]) CM/SW Contact:  Curlene Labrum, RN Phone Number: 06/28/2020, 3:20 PM   Clinical Narrative:    Case management met with the patient at the bedside and patient is to be transferred to [facility] today by ambulance.  Discharge clinicals were placed in the hub for Frederich Balding, RNCM at the facility prior to admission.  I called and spoke with the son and notified him that the patient is being transferred today.  I called PTAR and ambulance transport was arranged for the patient's transport.    Discharge clinicals and needed prescriptions were placed in the PTAR packet.  The primary RN is aware of the patient's transport to the facility - she will be calling report to [facility] at [phone].   Final next level of care: IP Rehab Facility Barriers to Discharge: Continued Medical Work up   Patient Goals and CMS Choice Patient states their goals for this hospitalization and ongoing recovery are:: Patient plans to discharge to CIR versus SNF placement. CMS Medicare.gov Compare Post Acute Care list provided to:: Patient Choice offered to / list presented to : Patient  Discharge Placement                       Discharge Plan and Services   Discharge Planning Services: CM Consult Post Acute Care Choice: IP Rehab                               Social Determinants of Health (SDOH) Interventions     Readmission Risk Interventions Readmission Risk Prevention Plan 06/23/2020  Transportation Screening Complete  PCP or Specialist Appt within 3-5 Days Complete  HRI or [place] Complete  Social Work Consult for [place] Planning/Counseling Complete  Palliative Care Screening Complete  Medication Review Press photographer) Complete  Some recent data might be hidden

## 2020-06-29 ENCOUNTER — Other Ambulatory Visit: Payer: Self-pay

## 2020-06-29 DIAGNOSIS — I10 Essential (primary) hypertension: Secondary | ICD-10-CM | POA: Diagnosis not present

## 2020-06-29 DIAGNOSIS — E11628 Type 2 diabetes mellitus with other skin complications: Secondary | ICD-10-CM | POA: Diagnosis not present

## 2020-06-29 DIAGNOSIS — Z89511 Acquired absence of right leg below knee: Secondary | ICD-10-CM | POA: Diagnosis not present

## 2020-06-29 DIAGNOSIS — E1169 Type 2 diabetes mellitus with other specified complication: Secondary | ICD-10-CM | POA: Diagnosis not present

## 2020-06-29 NOTE — Patient Outreach (Signed)
  [facility]) Care Management Chronic Special Needs Program    06/29/2020  Name: RICKIYA PICARIELLO, DOB: 08-04-1931  MRN: [id number]   Ms. Essynce Munsch is enrolled in a chronic special needs plan for Diabetes. Client presented to the Emergency room on 06/18/20 with diabetic infection of right foot/osteomyelitis. Right BKA on 06/22/20. Transferred to [facility] facility on 06/28/20. Client is active with [facility]. [facility] notified of client's disposition to [facility].   Plan: Care Coordination with [place] as indicated. [place] Management will continue to provide services for this member through 07/16/2020. [facility] [employer] Care Management Team will assume care 07/17/2020.  Thea Silversmith, RN, MSN, [facility] [phone]

## 2020-07-07 ENCOUNTER — Other Ambulatory Visit: Payer: Self-pay

## 2020-07-07 NOTE — Patient Outreach (Signed)
  [facility]) Care Management Chronic Special Needs Program    07/07/2020  Name: Kirstein, [place]: 22-Nov-1931  MRN: [id number]   Ms. [name] is enrolled in a chronic special needs plan for Diabetes. [facility] Management will continue to provide services for this member through 07/16/2020. [facility] [employer] Care Management Team will assume care 07/17/2020.  Thea Silversmith, RN, MSN, [place] [phone]

## 2020-07-19 DIAGNOSIS — I10 Essential (primary) hypertension: Secondary | ICD-10-CM | POA: Diagnosis not present

## 2020-07-19 DIAGNOSIS — E118 Type 2 diabetes mellitus with unspecified complications: Secondary | ICD-10-CM | POA: Diagnosis not present

## 2020-07-19 DIAGNOSIS — I2510 Atherosclerotic heart disease of native coronary artery without angina pectoris: Secondary | ICD-10-CM | POA: Diagnosis not present

## 2020-07-19 DIAGNOSIS — I5032 Chronic diastolic (congestive) heart failure: Secondary | ICD-10-CM | POA: Diagnosis not present

## 2020-07-21 ENCOUNTER — Other Ambulatory Visit: Payer: Self-pay

## 2020-07-25 DIAGNOSIS — Z9181 History of falling: Secondary | ICD-10-CM | POA: Diagnosis not present

## 2020-07-25 DIAGNOSIS — K219 Gastro-esophageal reflux disease without esophagitis: Secondary | ICD-10-CM | POA: Diagnosis not present

## 2020-07-25 DIAGNOSIS — Z7982 Long term (current) use of aspirin: Secondary | ICD-10-CM | POA: Diagnosis not present

## 2020-07-25 DIAGNOSIS — Z7902 Long term (current) use of antithrombotics/antiplatelets: Secondary | ICD-10-CM | POA: Diagnosis not present

## 2020-07-25 DIAGNOSIS — E1151 Type 2 diabetes mellitus with diabetic peripheral angiopathy without gangrene: Secondary | ICD-10-CM | POA: Diagnosis not present

## 2020-07-25 DIAGNOSIS — Z794 Long term (current) use of insulin: Secondary | ICD-10-CM | POA: Diagnosis not present

## 2020-07-25 DIAGNOSIS — F339 Major depressive disorder, recurrent, unspecified: Secondary | ICD-10-CM | POA: Diagnosis not present

## 2020-07-25 DIAGNOSIS — E782 Mixed hyperlipidemia: Secondary | ICD-10-CM | POA: Diagnosis not present

## 2020-07-25 DIAGNOSIS — I2510 Atherosclerotic heart disease of native coronary artery without angina pectoris: Secondary | ICD-10-CM | POA: Diagnosis not present

## 2020-07-25 DIAGNOSIS — E039 Hypothyroidism, unspecified: Secondary | ICD-10-CM | POA: Diagnosis not present

## 2020-07-25 DIAGNOSIS — F419 Anxiety disorder, unspecified: Secondary | ICD-10-CM | POA: Diagnosis not present

## 2020-07-25 DIAGNOSIS — I110 Hypertensive heart disease with heart failure: Secondary | ICD-10-CM | POA: Diagnosis not present

## 2020-07-25 DIAGNOSIS — Z993 Dependence on wheelchair: Secondary | ICD-10-CM | POA: Diagnosis not present

## 2020-07-25 DIAGNOSIS — I5032 Chronic diastolic (congestive) heart failure: Secondary | ICD-10-CM | POA: Diagnosis not present

## 2020-07-25 DIAGNOSIS — I428 Other cardiomyopathies: Secondary | ICD-10-CM | POA: Diagnosis not present

## 2020-07-25 DIAGNOSIS — Z4781 Encounter for orthopedic aftercare following surgical amputation: Secondary | ICD-10-CM | POA: Diagnosis not present

## 2020-07-25 DIAGNOSIS — Z955 Presence of coronary angioplasty implant and graft: Secondary | ICD-10-CM | POA: Diagnosis not present

## 2020-07-25 DIAGNOSIS — Z89511 Acquired absence of right leg below knee: Secondary | ICD-10-CM | POA: Diagnosis not present

## 2020-07-25 DIAGNOSIS — E049 Nontoxic goiter, unspecified: Secondary | ICD-10-CM | POA: Diagnosis not present

## 2020-07-25 DIAGNOSIS — Z87891 Personal history of nicotine dependence: Secondary | ICD-10-CM | POA: Diagnosis not present

## 2020-07-25 DIAGNOSIS — Z4801 Encounter for change or removal of surgical wound dressing: Secondary | ICD-10-CM | POA: Diagnosis not present

## 2020-07-29 DIAGNOSIS — E049 Nontoxic goiter, unspecified: Secondary | ICD-10-CM | POA: Diagnosis not present

## 2020-07-29 DIAGNOSIS — E1151 Type 2 diabetes mellitus with diabetic peripheral angiopathy without gangrene: Secondary | ICD-10-CM | POA: Diagnosis not present

## 2020-07-29 DIAGNOSIS — Z4781 Encounter for orthopedic aftercare following surgical amputation: Secondary | ICD-10-CM | POA: Diagnosis not present

## 2020-07-29 DIAGNOSIS — Z4801 Encounter for change or removal of surgical wound dressing: Secondary | ICD-10-CM | POA: Diagnosis not present

## 2020-07-29 DIAGNOSIS — Z7902 Long term (current) use of antithrombotics/antiplatelets: Secondary | ICD-10-CM | POA: Diagnosis not present

## 2020-07-29 DIAGNOSIS — I428 Other cardiomyopathies: Secondary | ICD-10-CM | POA: Diagnosis not present

## 2020-07-29 DIAGNOSIS — Z794 Long term (current) use of insulin: Secondary | ICD-10-CM | POA: Diagnosis not present

## 2020-07-29 DIAGNOSIS — E782 Mixed hyperlipidemia: Secondary | ICD-10-CM | POA: Diagnosis not present

## 2020-07-29 DIAGNOSIS — I5032 Chronic diastolic (congestive) heart failure: Secondary | ICD-10-CM | POA: Diagnosis not present

## 2020-07-29 DIAGNOSIS — I110 Hypertensive heart disease with heart failure: Secondary | ICD-10-CM | POA: Diagnosis not present

## 2020-07-29 DIAGNOSIS — Z7982 Long term (current) use of aspirin: Secondary | ICD-10-CM | POA: Diagnosis not present

## 2020-07-29 DIAGNOSIS — Z955 Presence of coronary angioplasty implant and graft: Secondary | ICD-10-CM | POA: Diagnosis not present

## 2020-07-29 DIAGNOSIS — K219 Gastro-esophageal reflux disease without esophagitis: Secondary | ICD-10-CM | POA: Diagnosis not present

## 2020-07-29 DIAGNOSIS — E039 Hypothyroidism, unspecified: Secondary | ICD-10-CM | POA: Diagnosis not present

## 2020-07-29 DIAGNOSIS — F339 Major depressive disorder, recurrent, unspecified: Secondary | ICD-10-CM | POA: Diagnosis not present

## 2020-07-29 DIAGNOSIS — Z89511 Acquired absence of right leg below knee: Secondary | ICD-10-CM | POA: Diagnosis not present

## 2020-07-29 DIAGNOSIS — Z87891 Personal history of nicotine dependence: Secondary | ICD-10-CM | POA: Diagnosis not present

## 2020-07-29 DIAGNOSIS — Z9181 History of falling: Secondary | ICD-10-CM | POA: Diagnosis not present

## 2020-07-29 DIAGNOSIS — Z993 Dependence on wheelchair: Secondary | ICD-10-CM | POA: Diagnosis not present

## 2020-07-29 DIAGNOSIS — I2510 Atherosclerotic heart disease of native coronary artery without angina pectoris: Secondary | ICD-10-CM | POA: Diagnosis not present

## 2020-07-29 DIAGNOSIS — F419 Anxiety disorder, unspecified: Secondary | ICD-10-CM | POA: Diagnosis not present

## 2020-08-09 ENCOUNTER — Ambulatory Visit (INDEPENDENT_AMBULATORY_CARE_PROVIDER_SITE_OTHER)
Admission: RE | Admit: 2020-08-09 | Discharge: 2020-08-09 | Disposition: A | Discharge status: Home / Self Care | Payer: HMO | Source: Ambulatory Visit | Attending: Surgery | Admitting: Surgery

## 2020-08-09 ENCOUNTER — Encounter: Payer: Self-pay | Admitting: Surgery

## 2020-08-09 ENCOUNTER — Ambulatory Visit: Payer: HMO | Admitting: Surgery

## 2020-08-09 ENCOUNTER — Other Ambulatory Visit: Payer: Self-pay | Admitting: Surgery

## 2020-08-09 ENCOUNTER — Ambulatory Visit (HOSPITAL_COMMUNITY)
Admission: RE | Admit: 2020-08-09 | Discharge: 2020-08-09 | Disposition: A | Discharge status: Home / Self Care | Payer: HMO | Source: Ambulatory Visit | Attending: Surgery | Admitting: Surgery

## 2020-08-09 ENCOUNTER — Other Ambulatory Visit: Payer: Self-pay

## 2020-08-09 VITALS — BP 157/69 | HR 86 | Temp 97.4°F | Resp 16

## 2020-08-09 DIAGNOSIS — L97401 Non-pressure chronic ulcer of unspecified heel and midfoot limited to breakdown of skin: Secondary | ICD-10-CM | POA: Diagnosis not present

## 2020-08-09 DIAGNOSIS — I779 Disorder of arteries and arterioles, unspecified: Secondary | ICD-10-CM

## 2020-08-09 DIAGNOSIS — E08621 Diabetes mellitus due to underlying condition with foot ulcer: Secondary | ICD-10-CM | POA: Diagnosis not present

## 2020-08-09 NOTE — Progress Notes (Signed)
[facility]  Patient name: [name] MRN: [id number] DOB: 10/07/1931 Sex: female   REASON FOR VISIT:    Follow up  [place]:   [name] a 87 y.o.femalewho is status post atherectomy and angioplasty of an 80% ostial left peroneal artery stenosis and drug-eluting balloon angioplasty of tandem 60-70% left superficial femoral artery lesions. This was performed on 04/16/2018 for a wound which has been treated by both [name] and podiatry. This wound healed. She developed a new wound and a repeat angiogram was performed on 09-03-2018. I stented her left SFA and repeated PTA of her peroneal.She is status post left 3d ray amputation (12-03-2019) and right TMA on 03-11-2020 by Dr. Doran Matthews for diabetic ulcers.She is having difficulty healing the right side. The left side is intact and healed.  I then on 04/08/2020 repeated angiography.  She had a 10 cm segment of the right superficial femoral artery with a 60% stenosis that was treated with orbital atherectomy and 5 mm drug-coated balloon.  She also had a subtotal occlusion of the origin of the right peroneal artery that was also treated with orbital atherectomy and 3 mm balloon angioplasty.  She had a right BKA by Dr. Doran Matthews on 06-22-2020.    [name] suffers from coronary artery disease. Shehas undergone PCI in the past. Sheis a diabetic. She is on statin for hypercholesterolemia and takes an ACE inhibitor for hypertension.  PAST MEDICAL HISTORY:   Past Medical History:  Diagnosis Date  . Anxiety   . Cellulitis 10/2015   LEFT FOOT  . CHF (congestive heart failure) ([place])   . Complication of anesthesia   . Coronary artery disease   . Diabetes mellitus without complication (HCC)    insulin dependent  . GERD (gastroesophageal reflux disease)   . Hypertension   . Hypothyroidism   . Neuromuscular disorder (HCC)    muscle cramps to lower  extremities  . Other primary cardiomyopathies   . Peripheral vascular disease ([place])   . PONV (postoperative nausea and vomiting)   . Shortness of breath      FAMILY HISTORY:   Family History  Problem Relation Age of Onset  . Heart disease Father   . Heart attack Father   . Diabetes Sister   . Heart disease Son        before age 64  . Heart attack 16        85yr old  . Sudden death Grandchild   . Hypertension Neg Hx     SOCIAL HISTORY:   Social History   Tobacco Use  . Smoking status: Former Smoker    Quit date: 11/28/1960    Years since quitting: 59.7  . Smokeless tobacco: Never Used  Substance Use Topics  . Alcohol use: Yes    Alcohol/week: 0.0 standard drinks    Comment: rare     ALLERGIES:   Allergies  Allergen Reactions  . Sulfa Antibiotics Nausea And Vomiting    Severe vomiting.      CURRENT MEDICATIONS:   Current Outpatient Medications  Medication Sig Dispense Refill  . acetaminophen (TYLENOL) 325 MG tablet Take 2 tablets (650 mg total) by mouth every 4 (four) hours as needed for headache or mild pain.    Marland Kitchen aspirin EC 81 MG EC tablet Take 1 tablet (81 mg total) by mouth daily. Swallow whole. (Patient taking differently: Take 325 mg by mouth at bedtime. Swallow whole.) 30 tablet 11  . atorvastatin (  LIPITOR) 20 MG tablet Take 1 tablet (20 mg total) by mouth daily. Please keep upcoming appt in July with Dr. Irish Lack before anymore refills. Thank you (Patient taking differently: Take 20 mg by mouth every evening.) 90 tablet 0  . b complex vitamins capsule Take 1 capsule by mouth daily.    . Biotin w/ Vitamins C & E (HAIR SKIN & NAILS GUMMIES PO) Take 2 tablets by mouth daily. gummy    . Calcium-Magnesium-Zinc (CAL-MAG-ZINC PO) Take 1 tablet by mouth daily.    . carvedilol (COREG) 25 MG tablet Take 1 tablet (25 mg total) by mouth 2 (two) times daily with a meal. *Please call and schedule an appointment with Dr Fletcher Anon* (Patient taking differently: Take  25 mg by mouth at bedtime.) 60 tablet 0  . cholecalciferol (VITAMIN D) 1000 units tablet Take 2,000 Units by mouth daily.     . clopidogrel (PLAVIX) 75 MG tablet TAKE 1 TABLET BY MOUTH ONCE DAILY (Patient taking differently: Take 75 mg by mouth at bedtime.) 30 tablet 11  . Coenzyme Q10 (CO Q 10) 100 MG CAPS Take 300 mg by mouth daily.     Marland Kitchen docusate sodium (COLACE) 100 MG capsule Take 1 capsule (100 mg total) by mouth 2 (two) times daily as needed for mild constipation. 10 capsule 0  . ergocalciferol (VITAMIN D2) 1.25 MG (50000 UT) capsule Take 50,000 Units by mouth every Friday.     . furosemide (LASIX) 40 MG tablet Take 1 tablet (40 mg total) by mouth daily. 90 tablet 3  . insulin aspart (NOVOLOG) 100 UNIT/ML injection Inject 10 Units into the skin 3 (three) times daily with meals. 10 mL 11  . insulin glargine (LANTUS) 100 UNIT/ML injection Inject 0.35 mLs (35 Units total) into the skin every evening. (Patient taking differently: Inject 70 Units into the skin at bedtime.) 10 mL 11  . insulin lispro (HUMALOG) 100 UNIT/ML injection Inject 10-20 Units into the skin See admin instructions. Take 10 units at breakfast, 20 units at lunch and dinner    . iron polysaccharides (NIFEREX) 150 MG capsule Take 150 mg by mouth daily.     Marland Kitchen lisinopril (PRINIVIL,ZESTRIL) 20 MG tablet Take 20 mg by mouth every evening.     . nitroGLYCERIN (NITROSTAT) 0.4 MG SL tablet PLACE 1 TABLET UNDER THE TONGUE EVERY 5 MINUTES AS NEEDED FOR CHEST PAIN (Patient taking differently: Place 0.4 mg under the tongue every 5 (five) minutes as needed for chest pain. PLACE 1 TABLET UNDER THE TONGUE EVERY 5 MINUTES AS NEEDED FOR CHEST PAIN) 25 tablet 4  . pantoprazole (PROTONIX) 40 MG tablet TAKE 1 TABLET BY MOUTH ONCE DAILY AT 6 AM (Patient taking differently: Take 40 mg by mouth daily.) 90 tablet 3  . PARoxetine (PAXIL) 40 MG tablet Take 40 mg by mouth at bedtime.     . polyethylene glycol (MIRALAX / GLYCOLAX) 17 g packet Take 17 g by  mouth daily as needed for mild constipation. 14 each 0  . potassium chloride SA (KLOR-CON) 20 MEQ tablet Take 1 tablet (20 mEq total) by mouth daily. 90 tablet 3  . senna (SENOKOT) 8.6 MG TABS tablet Take 1 tablet (8.6 mg total) by mouth daily as needed for mild constipation. 120 tablet 0   No current facility-administered medications for this visit.    REVIEW OF SYSTEMS:   [X]  denotes positive finding, [ ]  denotes negative finding Cardiac  Comments:  Chest pain or chest pressure:    Shortness of breath  upon exertion:    Short of breath when lying flat:    Irregular heart rhythm:        Vascular    Pain in calf, thigh, or hip brought on by ambulation:    Pain in feet at night that wakes you up from your sleep:     Blood clot in your veins:    Leg swelling:         Pulmonary    Oxygen at home:    Productive cough:     Wheezing:         Neurologic    Sudden weakness in arms or legs:     Sudden numbness in arms or legs:     Sudden onset of difficulty speaking or slurred speech:    Temporary loss of vision in one eye:     Problems with dizziness:         Gastrointestinal    Blood in stool:     Vomited blood:         Genitourinary    Burning when urinating:     Blood in urine:        Psychiatric    Major depression:         Hematologic    Bleeding problems:    Problems with blood clotting too easily:        Skin    Rashes or ulcers:        Constitutional    Fever or chills:      PHYSICAL EXAM:   Vitals:   08/09/20 1058  BP: (!) 157/69  Pulse: 86  Resp: 16  Temp: (!) 97.4 F (36.3 C)  TempSrc: Temporal  SpO2: 97%    GENERAL: The patient is a well-nourished female, in no acute distress. The vital signs are documented above. CARDIAC: There is a regular rate and rhythm.  VASCULAR: non-palpable left pedal pulse PULMONARY: Non-labored respirations MUSCULOSKELETAL: right BKA NEUROLOGIC: No focal weakness or paresthesias are detected. SKIN: There are no  ulcers or rashes noted. PSYCHIATRIC: The patient has a normal affect.  STUDIES:    I have reviewed the following: +-------+-----------+-----------+------------+------------+  ABI/TBIToday's ABIToday's TBIPrevious ABIPrevious TBI  +-------+-----------+-----------+------------+------------+  Right amputated amputated               +-------+-----------+-----------+------------+------------+  Left  0.90    0.49    1.03    0.93      +-------+-----------+-----------+------------+------------+  Right: No evidence of significant stenosis in the lower extremity arteries  to the knee level.   Left: 50-74% stenosis noted in the deep femoral artery. 50-74% stenosis  noted in the mid superficial femoral artery. Patent stent with no evidence  of stenosis in the proximal superficial femoral artery artery. Cystic mass  in the left popliteal fossa  measuring 3.42 cm.  MEDICAL ISSUES:   PAD: We discussed the ultrasound findings today.  She has moderate stenosis on the left.  She does not have any open wounds.  We have elected to repeat her ultrasound in 6 months.  She plans on getting fit for prosthesis in April.    Leia Alf, MD, FACS [facility] [phone] Pager [phone]

## 2020-08-10 ENCOUNTER — Other Ambulatory Visit: Payer: Self-pay

## 2020-08-10 DIAGNOSIS — I779 Disorder of arteries and arterioles, unspecified: Secondary | ICD-10-CM

## 2020-08-11 DIAGNOSIS — Z4781 Encounter for orthopedic aftercare following surgical amputation: Secondary | ICD-10-CM | POA: Diagnosis not present

## 2020-08-11 DIAGNOSIS — Z89511 Acquired absence of right leg below knee: Secondary | ICD-10-CM | POA: Diagnosis not present

## 2020-08-18 DIAGNOSIS — Z4781 Encounter for orthopedic aftercare following surgical amputation: Secondary | ICD-10-CM | POA: Diagnosis not present

## 2020-08-18 DIAGNOSIS — Z89511 Acquired absence of right leg below knee: Secondary | ICD-10-CM | POA: Diagnosis not present

## 2020-08-19 DIAGNOSIS — E782 Mixed hyperlipidemia: Secondary | ICD-10-CM | POA: Diagnosis not present

## 2020-08-19 DIAGNOSIS — Z7982 Long term (current) use of aspirin: Secondary | ICD-10-CM | POA: Diagnosis not present

## 2020-08-19 DIAGNOSIS — Z4801 Encounter for change or removal of surgical wound dressing: Secondary | ICD-10-CM | POA: Diagnosis not present

## 2020-08-19 DIAGNOSIS — F339 Major depressive disorder, recurrent, unspecified: Secondary | ICD-10-CM | POA: Diagnosis not present

## 2020-08-19 DIAGNOSIS — Z9181 History of falling: Secondary | ICD-10-CM | POA: Diagnosis not present

## 2020-08-19 DIAGNOSIS — I110 Hypertensive heart disease with heart failure: Secondary | ICD-10-CM | POA: Diagnosis not present

## 2020-08-19 DIAGNOSIS — Z87891 Personal history of nicotine dependence: Secondary | ICD-10-CM | POA: Diagnosis not present

## 2020-08-19 DIAGNOSIS — K219 Gastro-esophageal reflux disease without esophagitis: Secondary | ICD-10-CM | POA: Diagnosis not present

## 2020-08-19 DIAGNOSIS — Z993 Dependence on wheelchair: Secondary | ICD-10-CM | POA: Diagnosis not present

## 2020-08-19 DIAGNOSIS — Z7902 Long term (current) use of antithrombotics/antiplatelets: Secondary | ICD-10-CM | POA: Diagnosis not present

## 2020-08-19 DIAGNOSIS — E1151 Type 2 diabetes mellitus with diabetic peripheral angiopathy without gangrene: Secondary | ICD-10-CM | POA: Diagnosis not present

## 2020-08-19 DIAGNOSIS — Z794 Long term (current) use of insulin: Secondary | ICD-10-CM | POA: Diagnosis not present

## 2020-08-19 DIAGNOSIS — E039 Hypothyroidism, unspecified: Secondary | ICD-10-CM | POA: Diagnosis not present

## 2020-08-19 DIAGNOSIS — I5032 Chronic diastolic (congestive) heart failure: Secondary | ICD-10-CM | POA: Diagnosis not present

## 2020-08-19 DIAGNOSIS — E049 Nontoxic goiter, unspecified: Secondary | ICD-10-CM | POA: Diagnosis not present

## 2020-08-19 DIAGNOSIS — I428 Other cardiomyopathies: Secondary | ICD-10-CM | POA: Diagnosis not present

## 2020-08-19 DIAGNOSIS — I2510 Atherosclerotic heart disease of native coronary artery without angina pectoris: Secondary | ICD-10-CM | POA: Diagnosis not present

## 2020-08-19 DIAGNOSIS — Z955 Presence of coronary angioplasty implant and graft: Secondary | ICD-10-CM | POA: Diagnosis not present

## 2020-08-19 DIAGNOSIS — F419 Anxiety disorder, unspecified: Secondary | ICD-10-CM | POA: Diagnosis not present

## 2020-08-19 DIAGNOSIS — Z89511 Acquired absence of right leg below knee: Secondary | ICD-10-CM | POA: Diagnosis not present

## 2020-08-19 DIAGNOSIS — Z4781 Encounter for orthopedic aftercare following surgical amputation: Secondary | ICD-10-CM | POA: Diagnosis not present

## 2020-09-15 DIAGNOSIS — I2510 Atherosclerotic heart disease of native coronary artery without angina pectoris: Secondary | ICD-10-CM | POA: Diagnosis not present

## 2020-09-15 DIAGNOSIS — Z89511 Acquired absence of right leg below knee: Secondary | ICD-10-CM | POA: Diagnosis not present

## 2020-09-15 DIAGNOSIS — E039 Hypothyroidism, unspecified: Secondary | ICD-10-CM | POA: Diagnosis not present

## 2020-09-15 DIAGNOSIS — Z794 Long term (current) use of insulin: Secondary | ICD-10-CM | POA: Diagnosis not present

## 2020-09-15 DIAGNOSIS — Z7902 Long term (current) use of antithrombotics/antiplatelets: Secondary | ICD-10-CM | POA: Diagnosis not present

## 2020-09-15 DIAGNOSIS — F339 Major depressive disorder, recurrent, unspecified: Secondary | ICD-10-CM | POA: Diagnosis not present

## 2020-09-15 DIAGNOSIS — Z7982 Long term (current) use of aspirin: Secondary | ICD-10-CM | POA: Diagnosis not present

## 2020-09-15 DIAGNOSIS — E1151 Type 2 diabetes mellitus with diabetic peripheral angiopathy without gangrene: Secondary | ICD-10-CM | POA: Diagnosis not present

## 2020-09-15 DIAGNOSIS — Z4781 Encounter for orthopedic aftercare following surgical amputation: Secondary | ICD-10-CM | POA: Diagnosis not present

## 2020-09-15 DIAGNOSIS — I5032 Chronic diastolic (congestive) heart failure: Secondary | ICD-10-CM | POA: Diagnosis not present

## 2020-09-15 DIAGNOSIS — Z993 Dependence on wheelchair: Secondary | ICD-10-CM | POA: Diagnosis not present

## 2020-09-15 DIAGNOSIS — I110 Hypertensive heart disease with heart failure: Secondary | ICD-10-CM | POA: Diagnosis not present

## 2020-09-15 DIAGNOSIS — I428 Other cardiomyopathies: Secondary | ICD-10-CM | POA: Diagnosis not present

## 2020-09-15 DIAGNOSIS — E782 Mixed hyperlipidemia: Secondary | ICD-10-CM | POA: Diagnosis not present

## 2020-09-15 DIAGNOSIS — F419 Anxiety disorder, unspecified: Secondary | ICD-10-CM | POA: Diagnosis not present

## 2020-09-15 DIAGNOSIS — E049 Nontoxic goiter, unspecified: Secondary | ICD-10-CM | POA: Diagnosis not present

## 2020-09-15 DIAGNOSIS — K219 Gastro-esophageal reflux disease without esophagitis: Secondary | ICD-10-CM | POA: Diagnosis not present

## 2020-09-15 DIAGNOSIS — Z955 Presence of coronary angioplasty implant and graft: Secondary | ICD-10-CM | POA: Diagnosis not present

## 2020-09-15 DIAGNOSIS — Z9181 History of falling: Secondary | ICD-10-CM | POA: Diagnosis not present

## 2020-09-15 DIAGNOSIS — Z87891 Personal history of nicotine dependence: Secondary | ICD-10-CM | POA: Diagnosis not present

## 2020-09-15 DIAGNOSIS — Z4801 Encounter for change or removal of surgical wound dressing: Secondary | ICD-10-CM | POA: Diagnosis not present

## 2020-09-22 ENCOUNTER — Ambulatory Visit: Payer: HMO

## 2020-09-29 DIAGNOSIS — Z89511 Acquired absence of right leg below knee: Secondary | ICD-10-CM | POA: Diagnosis not present

## 2020-09-29 DIAGNOSIS — Z4781 Encounter for orthopedic aftercare following surgical amputation: Secondary | ICD-10-CM | POA: Diagnosis not present

## 2020-10-01 DIAGNOSIS — Z89511 Acquired absence of right leg below knee: Secondary | ICD-10-CM | POA: Diagnosis not present

## 2020-10-11 ENCOUNTER — Encounter: Payer: Self-pay | Admitting: Physical Therapy

## 2020-10-11 ENCOUNTER — Other Ambulatory Visit: Payer: Self-pay

## 2020-10-11 ENCOUNTER — Ambulatory Visit: Payer: HMO | Admitting: Physical Therapy

## 2020-10-11 DIAGNOSIS — R2689 Other abnormalities of gait and mobility: Secondary | ICD-10-CM

## 2020-10-11 DIAGNOSIS — R293 Abnormal posture: Secondary | ICD-10-CM

## 2020-10-11 DIAGNOSIS — Z9181 History of falling: Secondary | ICD-10-CM | POA: Diagnosis not present

## 2020-10-11 DIAGNOSIS — R2681 Unsteadiness on feet: Secondary | ICD-10-CM

## 2020-10-11 DIAGNOSIS — M6281 Muscle weakness (generalized): Secondary | ICD-10-CM | POA: Diagnosis not present

## 2020-10-11 DIAGNOSIS — M79661 Pain in right lower leg: Secondary | ICD-10-CM | POA: Diagnosis not present

## 2020-10-11 NOTE — Therapy (Signed)
[facility] [address] [place], [place], [address] Phone: [phone]   Fax:  [phone]  Physical Therapy Evaluation  Patient Details  Name: [name] MRN: [id number] Date of Birth: [birth year] Referring Provider (PT): Jeanette Amsler Matthews, [place]   Encounter Date: 10/11/2020   PT End of Session - 10/11/20 1251    Visit Number 1    Number of Visits 25    Date for PT Re-Evaluation 01/06/21    Authorization Type [employer]    Authorization Time Period $20 copay    PT Start Time 0930    PT Stop Time 1015    PT Time Calculation (min) 45 min    Equipment Utilized During Treatment Gait belt    Activity Tolerance Patient tolerated treatment well;Patient limited by pain    Behavior During Therapy [facility] for tasks assessed/performed           Past Medical History:  Diagnosis Date  . Anxiety   . Cellulitis 10/2015   LEFT FOOT  . CHF (congestive heart failure) ([place])   . Complication of anesthesia   . Coronary artery disease   . Diabetes mellitus without complication (HCC)    insulin dependent  . GERD (gastroesophageal reflux disease)   . Hypertension   . Hypothyroidism   . Neuromuscular disorder (HCC)    muscle cramps to lower extremities  . Other primary cardiomyopathies   . Peripheral vascular disease ([place])   . PONV (postoperative nausea and vomiting)   . Shortness of breath     Past Surgical History:  Procedure Laterality Date  . ABDOMINAL AORTOGRAM N/A 09/03/2018   Procedure: ABDOMINAL AORTOGRAM;  Surgeon: Serafina Mitchell, MD;  Location: [place] [facility];  Service: Cardiovascular;  Laterality: N/A;  . ABDOMINAL AORTOGRAM W/LOWER EXTREMITY N/A 04/16/2018   Procedure: ABDOMINAL AORTOGRAM W/LOWER EXTREMITY;  Surgeon: Serafina Mitchell, MD;  Location: [place] [facility];  Service: Cardiovascular;  Laterality: N/A;  unilateral  . ABDOMINAL AORTOGRAM W/LOWER EXTREMITY Bilateral 04/08/2020   Procedure: ABDOMINAL AORTOGRAM  W/LOWER EXTREMITY;  Surgeon: Serafina Mitchell, MD;  Location: [place] [facility];  Service: Cardiovascular;  Laterality: Bilateral;  . ABDOMINAL HYSTERECTOMY    . AMPUTATION Left 12/03/2018   Procedure: Left 3rd ray amputation;  Surgeon: Wylene Simmer, MD;  Location: [place];  Service: Orthopedics;  Laterality: Left;  43min  . AMPUTATION Right 06/22/2020   Procedure: AMPUTATION BELOW KNEE;  Surgeon: Wylene Simmer, MD;  Location: [place];  Service: Orthopedics;  Laterality: Right;  . CHOLECYSTECTOMY    . CORONARY ANGIOPLASTY WITH STENT PLACEMENT  08/09/2011   DES  to mid circumflex  . I & D EXTREMITY Left 10/29/2015   Procedure: Irrigation and Debridement Left Foot;  Surgeon: Newt Minion, MD;  Location: [place];  Service: Orthopedics;  Laterality: Left;  . LEFT HEART CATHETERIZATION WITH CORONARY ANGIOGRAM N/A 08/08/2012   Procedure: LEFT HEART CATHETERIZATION WITH CORONARY ANGIOGRAM;  Surgeon: [name], MD;  Location: [facility];  Service: Cardiovascular;  Laterality: N/A;  . PERIPHERAL VASCULAR ATHERECTOMY  04/16/2018   Procedure: PERIPHERAL VASCULAR ATHERECTOMY;  Surgeon: Serafina Mitchell, MD;  Location: [place] [facility];  Service: Cardiovascular;;  lt. Peroneal  . PERIPHERAL VASCULAR ATHERECTOMY Right 04/08/2020   Procedure: PERIPHERAL VASCULAR ATHERECTOMY;  Surgeon: Serafina Mitchell, MD;  Location: [place] [facility];  Service: Cardiovascular;  Laterality: Right;  SFA and Peroneal  . PERIPHERAL VASCULAR BALLOON ANGIOPLASTY  04/16/2018   Procedure:  PERIPHERAL VASCULAR BALLOON ANGIOPLASTY;  Surgeon: Serafina Mitchell, MD;  Location: [place] [facility];  Service: Cardiovascular;;  lt. sfa and AT  . PERIPHERAL VASCULAR BALLOON ANGIOPLASTY Left 09/03/2018   Procedure: PERIPHERAL VASCULAR BALLOON ANGIOPLASTY;  Surgeon: Serafina Mitchell, MD;  Location: [place] [facility];  Service: Cardiovascular;  Laterality: Left;  TP TRUNK  . PERIPHERAL VASCULAR BALLOON ANGIOPLASTY Right  04/08/2020   Procedure: PERIPHERAL VASCULAR BALLOON ANGIOPLASTY;  Surgeon: Serafina Mitchell, MD;  Location: [place] [facility];  Service: Cardiovascular;  Laterality: Right;  SFA (DCB), Peroneal  . PERIPHERAL VASCULAR CATHETERIZATION N/A 12/30/2014   Procedure: Lower Extremity Angiography;  Surgeon: Wellington Hampshire, MD;  Location: [place] [facility];  Service: Cardiovascular;  Laterality: N/A;  . PERIPHERAL VASCULAR INTERVENTION Left 09/03/2018   Procedure: PERIPHERAL VASCULAR INTERVENTION;  Surgeon: Serafina Mitchell, MD;  Location: [place] [facility];  Service: Cardiovascular;  Laterality: Left;  SFA STENT   . TENDON RELEASE Right 03/11/2020   Procedure: Heel Cord Lengthening;  Surgeon: Wylene Simmer, MD;  Location: [place];  Service: Orthopedics;  Laterality: Right;  . THYROID SURGERY     radioactive iodine   . TONSILLECTOMY    . TRANSMETATARSAL AMPUTATION Right 03/11/2020   Procedure: Right foot transmetatarsal amputation;  Surgeon: Wylene Simmer, MD;  Location: [place];  Service: Orthopedics;  Laterality: Right;    There were no vitals filed for this visit.    Subjective Assessment - 10/11/20 0938    Subjective This 87yo female was referred to PT by Jeanette Sing, PA with [phone] acquired absence of right leg below knee which occured on 06/22/2020. She received prosthesis on 10/01/2020.    Patient is accompained by: Family member   son, [name]   Pertinent History right TTA, anxiety, CHF, IDDM, HTN, PVD, LE muscle cramps,    Patient Stated Goals to use prosthesis in community & apt complex    Currently in Pain? No/denies              [facility] PT Assessment - 10/11/20 0930      Assessment   Medical Diagnosis Z89.511 acquired absence of right leg below knee    Referring Provider (PT) [name], [place]    Onset Date/Surgical Date 10/01/20   Prosthesis delivery   Hand Dominance Right    Prior Therapy HHPT until 2 weeks ago      Precautions    Precautions Fall      Balance Screen   Has the patient fallen in the past 6 months Yes    How many times? 3   no injuries, trying to get up   Has the patient had a decrease in activity level because of a fear of falling?  Yes    Is the patient reluctant to leave their home because of a fear of falling?  No   uses w/c     Home Environment   Living Environment Private residence    [place]   1st floor   Home Access Other (comment)   curb from [place] - 2 wheels;Walker - 4 wheels;Cane - single point;Bedside commode;Tub bench;Grab bars - tub/shower;Wheelchair - manual   uses BSC as raised toilet seat     Prior Function   Level of Independence Independent;Independent with household mobility with device;Independent with community mobility with device  cane prior to transmet amp in Aug, then rollator until [employer] Retired    Leisure walk & be outside, knit, read, watch tv,      Posture/Postural Control   Posture/Postural Control Postural limitations    Postural Limitations Rounded Shoulders;Forward head;Flexed trunk;Weight shift left      ROM / Strength   AROM / PROM / Strength AROM;Strength      AROM   Overall AROM  Within functional limits for tasks performed      Strength   Overall Strength Within functional limits for tasks performed      Transfers   Transfers Sit to Stand;Stand to Sit    Sit to Stand 5: Supervision;From elevated surface;With upper extremity assist;With armrests;From chair/3-in-1;Other (comment)   requires RW to stabilize, LLE > RLE   Stand to Sit 5: Supervision;With upper extremity assist;With armrests;To chair/3-in-1;To elevated surface;Other (comment)   requires RW to stabilize, LLE > RLE     Ambulation/Gait   Ambulation/Gait Yes    Ambulation/Gait Assistance 4: Min assist    Ambulation/Gait Assistance Details exxcessive UE weight bearing on RW     Ambulation Distance (Feet) 50 Feet    Assistive device Rolling walker;Prosthesis    Gait Pattern Step-to pattern;Decreased step length - left;Decreased stance time - right;Decreased hip/knee flexion - right;Decreased weight shift to right;Right hip hike;Right flexed knee in stance;Antalgic;Lateral hip instability;Trunk flexed   RLE adduction   Ambulation Surface Level;Indoor    Gait velocity 0.82 ft/sec      Standardized Balance Assessment   Standardized Balance Assessment Berg Balance Test      Berg Balance Test   Sit to Stand Needs minimal aid to stand or to stabilize    Standing Unsupported Unable to stand 30 seconds unassisted   MinA & multiple attempts   Sitting with Back Unsupported but Feet Supported on Floor or Stool Unable to sit without support 10 seconds    Stand to Sit Uses backs of legs against chair to control descent    Transfers Able to transfer safely, definite need of hands    Standing Unsupported with Eyes Closed Needs help to keep from falling   ModA without UE support   Standing Unsupported with Feet Together Needs help to attain position and unable to hold for 15 seconds    From Standing, Reach Forward with Outstretched Arm Loses balance while trying/requires external support   reaches 2" with minA   From Standing Position, Pick up Object from Floor Unable to try/needs assist to keep balance    From Standing Position, Turn to Look Behind Over each Shoulder Needs assist to keep from losing balance and falling   turns to side only with minA   Turn 360 Degrees Needs assistance while turning    Standing Unsupported, Alternately Place Feet on Step/Stool Needs assistance to keep from falling or unable to try    Standing Unsupported, One Foot in Front Loses balance while stepping or standing    Standing on One Leg Unable to try or needs assist to prevent fall    Total Score 6           Prosthetics Assessment - 10/11/20 0930      Prosthetics   Prosthetic Care Dependent  with Skin check;Residual limb care;Care of non-amputated limb;Prosthetic cleaning;Ply sock cleaning;Proper wear schedule/adjustment;Correct ply sock adjustment;Proper weight-bearing schedule/adjustment    Donning prosthesis  Supervision    Doffing prosthesis  Supervision    Current prosthetic wear tolerance (days/week)  daily  Current prosthetic wear tolerance (#hours/day)  progressed up to 4-5 hours    Current prosthetic weight-bearing tolerance (hours/day)  Pt tolerated standing for 5 min with partial weight on prosthesis with anterior tibial pain 5/10    Edema pitting    Residual limb condition  no open areas, frial skin, no hair growth, normal color & temperature    Prosthesis Description silicon gel liner with pin lock suspension, total contact socket, dynamic response foot                     Objective measurements completed on examination: See above findings.       [facility] PT Treatment/Exercise - 10/11/20 0930      Prosthetics   Prosthetic Care Comments  Wear 4hrs 2x/day for 5 days, if no pain or skin change, then 5hrs 2x/day.    Education Provided Skin check;Residual limb care;Prosthetic cleaning;Proper Donning;Proper Doffing;Proper wear schedule/adjustment;Other (comment)   see prosthetic care comments   Person(s) Educated Patient;Child(ren)    Education Method Explanation;Demonstration;Tactile cues;Verbal cues    Education Method Verbalized understanding;Returned demonstration;Tactile cues required;Verbal cues required;Needs further instruction                    PT Short Term Goals - 10/11/20 1626      PT SHORT TERM GOAL #1   Title Patient donnes prosthesis modified independent & verbalizes proper cleaning.    Time 4    Period Weeks    Status New    Target Date 11/11/20      PT SHORT TERM GOAL #2   Title Patient tolerates prosthesis >10 hrs total /day without skin issues or limb pain >5/10 after standing.    Time 4    Period Weeks     Status New    Target Date 11/11/20      PT SHORT TERM GOAL #3   Title Patient able to pick up items from floor & reach 10" with RW support safely.    Time 4    Period Weeks    Status New    Target Date 11/11/20      PT SHORT TERM GOAL #4   Title Patient ambulates 100' with RW & prosthesis with supervision.    Time 4    Period Weeks    Status New    Target Date 11/11/20      PT SHORT TERM GOAL #5   Title Patient negotiates ramps & curbs with RW & prosthesis with modA.    Time 4    Period Weeks    Status New    Target Date 11/11/20             PT Long Term Goals - 10/11/20 1621      PT LONG TERM GOAL #1   Title Patient demonstrates & verbalized understanding of prosthetic care to enable safe utilization of prosthesis.    Time 12    Period Weeks    Status New    Target Date 01/06/21      PT LONG TERM GOAL #2   Title Patient tolerates prosthesis wear >90% of awake hours without skin or limb pain issues.    Time 12    Period Weeks    Status New    Target Date 01/06/21      PT LONG TERM GOAL #3   Title Berg Balance >/= 36/56 to indicate lower fall risk    Time 12    Period Weeks  Status New    Target Date 01/06/21      PT LONG TERM GOAL #4   Title Patient ambulates 300' with LRAD & prosthesis modified independent.    Time 12    Period Weeks    Status New    Target Date 01/06/21      PT LONG TERM GOAL #5   Title Patient negotiates ramps, curbs & stairs with LRAD & prosthesis modified independent.    Time 12    Period Weeks    Status New    Target Date 01/06/21                  Plan - 10/11/20 1253    Clinical Impression Statement This 87yo female underwent a right Transtibial Amputation on 06/22/2020 (after right Transmetatarsal Aug 2021) and received her first prosthesis on 10/01/2020.  She is dependent in proper prosthetic care which increases risk of skin issues. She has pain in residual limb with weight bearing.  Patient is only wearing  prosthesis up to 5 hours which limits function during her day.  Berg Balance score of 6/56 and requires assistance without UE support indicates high fall risk.  Patient's prosthetic gait has significant deviations including excessive UE weight bearing and gait velocity of 0.82 ft/sec indicating high fall risk. Patient would benefit from skilled PT to improve function & safety with her prosthesis.    Personal Factors and Comorbidities Age;Comorbidity 3+;Fitness;Time since onset of injury/illness/exacerbation;Transportation    Comorbidities right TTA, anxiety, CHF, IDDM, HTN, PVD, LE muscle cramps    Examination-Activity Limitations Lift;Locomotion Level;Squat;Stairs;Stand;Transfers    Examination-Participation Restrictions Community Activity;Meal Prep    Stability/Clinical Decision Making Evolving/Moderate complexity    Clinical Decision Making Moderate    Rehab Potential Good    PT Frequency 2x / week    PT Duration 12 weeks    PT Treatment/Interventions ADLs/Self Care Home Management;DME Instruction;Gait training;Stair training;Functional mobility training;Therapeutic activities;Therapeutic exercise;Neuromuscular re-education;Patient/family education;Prosthetic Training;Scar mobilization;Passive range of motion    PT Next Visit Plan review prosthetic care, HEP at sink, prosthetic gait with RW    Consulted and Agree with Plan of Care Patient;Family member/caregiver    Family Member Consulted son, Lerlene Treadwell           Patient will benefit from skilled therapeutic intervention in order to improve the following deficits and impairments:  Abnormal gait,Decreased activity tolerance,Decreased balance,Decreased endurance,Decreased knowledge of use of DME,Decreased mobility,Decreased skin integrity,Decreased strength,Increased edema,Impaired flexibility,Postural dysfunction,Prosthetic Dependency,Pain  Visit Diagnosis: Other abnormalities of gait and mobility  Unsteadiness on feet  History of  falling  Muscle weakness (generalized)  Abnormal posture  Pain in right lower leg     Problem List Patient Active Problem List   Diagnosis Date Noted  . Diabetic infection of right foot ([place]) 06/19/2020  . Chronic osteomyelitis of left foot ([place]) 06/19/2020  . HCAP (healthcare-associated pneumonia)   . Elevated troponin level not due myocardial infarction 03/13/2020  . GERD without esophagitis 03/13/2020  . Anemia 03/13/2020  . Thyroid goiter 03/13/2020  . Acute respiratory failure with hypoxia ([place]) 03/13/2020  . Type 2 diabetes mellitus with complication, without long-term current use of insulin ([place]) 03/13/2020  . Elevated liver enzymes 03/13/2020  . Acute on chronic congestive heart failure ([place]) 03/13/2020  . Acute cardiogenic pulmonary edema (HCC) 03/12/2020  . Diabetic ulcer of right foot ([place]) 03/12/2020  . Metatarsalgia of left foot 03/14/2019  . Amputated toe ([place]) 12/06/2018  . Achilles tendon contracture, left 07/24/2016  . Ulcer of  left foot, limited to breakdown of skin ([place]) 06/01/2016  . Cellulitis 10/27/2015  . Severe peripheral arterial disease ([place]) 12/28/2014  . Chronic diastolic heart failure ([place]) 12/30/2013  . Edema 12/30/2013  . Mixed hyperlipidemia 06/24/2013  . Mitral valve disorders(424.0) 06/24/2013  . Coronary artery disease involving native coronary artery of native heart   . Essential hypertension   . Diabetes mellitus with peripheral vascular disease ([place])   . Other primary cardiomyopathies     Jamey Reas, PT, DPT 10/11/2020, 4:33 PM  [facility] [address] [place], [place], [address] Phone: [phone]   Fax:  [phone]  Name: [name] MRN: [id number] Date of Birth: 06/16/1932

## 2020-10-13 ENCOUNTER — Other Ambulatory Visit: Payer: Self-pay | Admitting: Interventional Cardiology

## 2020-10-13 DIAGNOSIS — Z89511 Acquired absence of right leg below knee: Secondary | ICD-10-CM | POA: Diagnosis not present

## 2020-10-19 ENCOUNTER — Ambulatory Visit: Payer: HMO | Admitting: Physical Therapy

## 2020-10-19 ENCOUNTER — Other Ambulatory Visit: Payer: Self-pay

## 2020-10-19 ENCOUNTER — Encounter: Payer: Self-pay | Admitting: Physical Therapy

## 2020-10-19 DIAGNOSIS — R2681 Unsteadiness on feet: Secondary | ICD-10-CM

## 2020-10-19 DIAGNOSIS — Z9181 History of falling: Secondary | ICD-10-CM

## 2020-10-19 DIAGNOSIS — R2689 Other abnormalities of gait and mobility: Secondary | ICD-10-CM

## 2020-10-19 DIAGNOSIS — M79661 Pain in right lower leg: Secondary | ICD-10-CM

## 2020-10-19 DIAGNOSIS — M6281 Muscle weakness (generalized): Secondary | ICD-10-CM | POA: Diagnosis not present

## 2020-10-19 DIAGNOSIS — R293 Abnormal posture: Secondary | ICD-10-CM | POA: Diagnosis not present

## 2020-10-19 NOTE — Therapy (Signed)
[facility] Physical Therapy [address] [place], [place], [address] Phone: [phone]   Fax:  [phone]  Physical Therapy Treatment  Patient Details  Name: [name] MRN: [id number] Date of Birth: 30-Apr-1932 Referring Provider (PT): Marya Amsler [place], [place]   Encounter Date: 10/19/2020   PT End of Session - 10/19/20 6256    Visit Number 2    Number of Visits 25    Date for PT Re-Evaluation 01/06/21    Authorization Type [employer]    Authorization Time Period $20 copay    PT Start Time 0928    PT Stop Time 1015    PT Time Calculation (min) 47 min    Equipment Utilized During Treatment Gait belt    Activity Tolerance Patient tolerated treatment well;Patient limited by pain    Behavior During Therapy [facility] for tasks assessed/performed           Past Medical History:  Diagnosis Date  . Anxiety   . Cellulitis 10/2015   LEFT FOOT  . CHF (congestive heart failure) ([place])   . Complication of anesthesia   . Coronary artery disease   . Diabetes mellitus without complication (HCC)    insulin dependent  . GERD (gastroesophageal reflux disease)   . Hypertension   . Hypothyroidism   . Neuromuscular disorder (HCC)    muscle cramps to lower extremities  . Other primary cardiomyopathies   . Peripheral vascular disease ([place])   . PONV (postoperative nausea and vomiting)   . Shortness of breath     Past Surgical History:  Procedure Laterality Date  . ABDOMINAL AORTOGRAM N/A 09/03/2018   Procedure: ABDOMINAL AORTOGRAM;  Surgeon: Serafina Mitchell, MD;  Location: [place] [facility];  Service: Cardiovascular;  Laterality: N/A;  . ABDOMINAL AORTOGRAM W/LOWER EXTREMITY N/A 04/16/2018   Procedure: ABDOMINAL AORTOGRAM W/LOWER EXTREMITY;  Surgeon: Serafina Mitchell, MD;  Location: [place] [facility];  Service: Cardiovascular;  Laterality: N/A;  unilateral  . ABDOMINAL AORTOGRAM W/LOWER EXTREMITY Bilateral 04/08/2020   Procedure: ABDOMINAL AORTOGRAM W/LOWER  EXTREMITY;  Surgeon: Serafina Mitchell, MD;  Location: [place] [facility];  Service: Cardiovascular;  Laterality: Bilateral;  . ABDOMINAL HYSTERECTOMY    . AMPUTATION Left 12/03/2018   Procedure: Left 3rd ray amputation;  Surgeon: Wylene Simmer, MD;  Location: [place];  Service: Orthopedics;  Laterality: Left;  46min  . AMPUTATION Right 06/22/2020   Procedure: AMPUTATION BELOW KNEE;  Surgeon: Wylene Simmer, MD;  Location: [place];  Service: Orthopedics;  Laterality: Right;  . CHOLECYSTECTOMY    . CORONARY ANGIOPLASTY WITH STENT PLACEMENT  08/09/2011   DES  to mid circumflex  . I & D EXTREMITY Left 10/29/2015   Procedure: Irrigation and Debridement Left Foot;  Surgeon: Newt Minion, MD;  Location: [place];  Service: Orthopedics;  Laterality: Left;  . LEFT HEART CATHETERIZATION WITH CORONARY ANGIOGRAM N/A 08/08/2012   Procedure: LEFT HEART CATHETERIZATION WITH CORONARY ANGIOGRAM;  Surgeon: [name], MD;  Location: [facility];  Service: Cardiovascular;  Laterality: N/A;  . PERIPHERAL VASCULAR ATHERECTOMY  04/16/2018   Procedure: PERIPHERAL VASCULAR ATHERECTOMY;  Surgeon: Serafina Mitchell, MD;  Location: [place] [facility];  Service: Cardiovascular;;  lt. Peroneal  . PERIPHERAL VASCULAR ATHERECTOMY Right 04/08/2020   Procedure: PERIPHERAL VASCULAR ATHERECTOMY;  Surgeon: Serafina Mitchell, MD;  Location: [place] [facility];  Service: Cardiovascular;  Laterality: Right;  SFA and Peroneal  . PERIPHERAL VASCULAR BALLOON ANGIOPLASTY  04/16/2018   Procedure:  PERIPHERAL VASCULAR BALLOON ANGIOPLASTY;  Surgeon: Serafina Mitchell, MD;  Location: [place] [facility];  Service: Cardiovascular;;  lt. sfa and AT  . PERIPHERAL VASCULAR BALLOON ANGIOPLASTY Left 09/03/2018   Procedure: PERIPHERAL VASCULAR BALLOON ANGIOPLASTY;  Surgeon: Serafina Mitchell, MD;  Location: [place] [facility];  Service: Cardiovascular;  Laterality: Left;  TP TRUNK  . PERIPHERAL VASCULAR BALLOON ANGIOPLASTY Right 04/08/2020    Procedure: PERIPHERAL VASCULAR BALLOON ANGIOPLASTY;  Surgeon: Serafina Mitchell, MD;  Location: [place] [facility];  Service: Cardiovascular;  Laterality: Right;  SFA (DCB), Peroneal  . PERIPHERAL VASCULAR CATHETERIZATION N/A 12/30/2014   Procedure: Lower Extremity Angiography;  Surgeon: Wellington Hampshire, MD;  Location: [place] [facility];  Service: Cardiovascular;  Laterality: N/A;  . PERIPHERAL VASCULAR INTERVENTION Left 09/03/2018   Procedure: PERIPHERAL VASCULAR INTERVENTION;  Surgeon: Serafina Mitchell, MD;  Location: [place] [facility];  Service: Cardiovascular;  Laterality: Left;  SFA STENT   . TENDON RELEASE Right 03/11/2020   Procedure: Heel Cord Lengthening;  Surgeon: Wylene Simmer, MD;  Location: [place];  Service: Orthopedics;  Laterality: Right;  . THYROID SURGERY     radioactive iodine   . TONSILLECTOMY    . TRANSMETATARSAL AMPUTATION Right 03/11/2020   Procedure: Right foot transmetatarsal amputation;  Surgeon: Wylene Simmer, MD;  Location: [place];  Service: Orthopedics;  Laterality: Right;    There were no vitals filed for this visit.   Subjective Assessment - 10/19/20 0928    Subjective She wore prosthesis 4hrs 2x/day last week and started 5hrs 2x/day over weekend without issues.    Patient is accompained by: Family member   son, [name]   Pertinent History right TTA, anxiety, CHF, IDDM, HTN, PVD, LE muscle cramps,    Patient Stated Goals to use prosthesis in community & apt complex    Currently in Pain? No/denies                             [facility] Adult PT Treatment/Exercise - 10/19/20 0928      Transfers   Transfers Sit to Stand;Stand to Sit    Sit to Stand 5: Supervision;From elevated surface;With upper extremity assist;With armrests;From chair/3-in-1;Other (comment)   to RW   Stand to Sit 5: Supervision;With upper extremity assist;With armrests;To chair/3-in-1;To elevated surface;Other (comment)   from RW   Comments  PT recommended sitting on all furniture that she would sit on at her home while son is available to help. If she has difficulty arising, then only sit on those items when she has assistance.      Ambulation/Gait   Ambulation/Gait Yes    Ambulation/Gait Assistance 5: Supervision    Ambulation/Gait Assistance Details demo & verbal cues on step width and turning.    Ambulation Distance (Feet) 100 Feet   20' X 3, 60' & 100'   Assistive device Rolling walker;Prosthesis    Ambulation Surface Level;Indoor      Prosthetics   Prosthetic Care Comments  PT instructed in signs of sweating with need to pat dry limb/liner and use antiperspirant on limb after showering.  PT instructed with demo, verbal cues on adjusting ply socks with donning, gait & patella location with too few, too many & correct ply fit.    Current prosthetic wear tolerance (days/week)  daily    Current prosthetic wear tolerance (#hours/day)  5 hrs 2x/day    Current prosthetic weight-bearing tolerance (hours/day)  Pt tolerated standing for 5 min with partial weight on prosthesis with anterior tibial pain 5/10    Edema pitting    Residual limb condition  no open areas, frial skin, no hair growth, normal color & temperature    Education Provided Ply sock cleaning;Proper Donning;Correct ply sock adjustment;Proper wear schedule/adjustment;Other (comment)   see prosthetic care comments   Person(s) Educated Patient    Education Method Explanation;Demonstration;Tactile cues;Verbal cues    Education Method Verbalized understanding;Returned demonstration;Tactile cues required;Verbal cues required;Needs further instruction    Donning Prosthesis Supervision                    PT Short Term Goals - 10/19/20 1252      PT SHORT TERM GOAL #1   Title Patient donnes prosthesis modified independent & verbalizes proper cleaning.    Time 4    Period Weeks    Status On-going    Target Date 11/11/20      PT SHORT TERM GOAL #2   Title  Patient tolerates prosthesis >10 hrs total /day without skin issues or limb pain <5/10 after standing.    Time 4    Period Weeks    Status On-going    Target Date 11/11/20      PT SHORT TERM GOAL #3   Title Patient able to pick up items from floor & reach 10" with RW support safely.    Time 4    Period Weeks    Status On-going    Target Date 11/11/20      PT SHORT TERM GOAL #4   Title Patient ambulates 100' with RW & prosthesis with supervision.    Time 4    Period Weeks    Status On-going    Target Date 11/11/20      PT SHORT TERM GOAL #5   Title Patient negotiates ramps & curbs with RW & prosthesis with modA.    Time 4    Period Weeks    Status On-going    Target Date 11/11/20             PT Long Term Goals - 10/19/20 1254      PT LONG TERM GOAL #1   Title Patient demonstrates & verbalized understanding of prosthetic care to enable safe utilization of prosthesis.    Time 12    Period Weeks    Status New    Target Date 01/06/21      PT LONG TERM GOAL #2   Title Patient tolerates prosthesis wear >90% of awake hours without skin or limb pain issues.    Time 12    Period Weeks    Status On-going    Target Date 01/06/21      PT LONG TERM GOAL #3   Title Berg Balance >/= 36/56 to indicate lower fall risk    Time 12    Period Weeks    Status On-going    Target Date 01/06/21      PT LONG TERM GOAL #4   Title Patient ambulates 300' with LRAD & prosthesis modified independent.    Time 12    Period Weeks    Status On-going    Target Date 01/06/21      PT LONG TERM GOAL #5   Title Patient negotiates ramps, curbs & stairs with LRAD & prosthesis modified independent.    Time 12    Period Weeks    Status On-going    Target Date 01/06/21  Plan - 10/19/20 0927    Clinical Impression Statement PT educated pt on sweat management & adjusting ply socks and she appears to have general understanding.  PT also instructed in prosthetic gait with  RW and appears safe to ambulate limited distances in her home.    Personal Factors and Comorbidities Age;Comorbidity 3+;Fitness;Time since onset of injury/illness/exacerbation;Transportation    Comorbidities right TTA, anxiety, CHF, IDDM, HTN, PVD, LE muscle cramps    Examination-Activity Limitations Lift;Locomotion Level;Squat;Stairs;Stand;Transfers    Examination-Participation Restrictions Community Activity;Meal Prep    Stability/Clinical Decision Making Evolving/Moderate complexity    Rehab Potential Good    PT Frequency 2x / week    PT Duration 12 weeks    PT Treatment/Interventions ADLs/Self Care Home Management;DME Instruction;Gait training;Stair training;Functional mobility training;Therapeutic activities;Therapeutic exercise;Neuromuscular re-education;Patient/family education;Prosthetic Training;Scar mobilization;Passive range of motion    PT Next Visit Plan review prosthetic care, HEP at sink, prosthetic gait with RW    Consulted and Agree with Plan of Care Patient;Family member/caregiver    Family Member Consulted son, Porshea Janowski           Patient will benefit from skilled therapeutic intervention in order to improve the following deficits and impairments:  Abnormal gait,Decreased activity tolerance,Decreased balance,Decreased endurance,Decreased knowledge of use of DME,Decreased mobility,Decreased skin integrity,Decreased strength,Increased edema,Impaired flexibility,Postural dysfunction,Prosthetic Dependency,Pain  Visit Diagnosis: Other abnormalities of gait and mobility  Unsteadiness on feet  History of falling  Muscle weakness (generalized)  Abnormal posture  Pain in right lower leg     Problem List Patient Active Problem List   Diagnosis Date Noted  . Diabetic infection of right foot ([place]) 06/19/2020  . Chronic osteomyelitis of left foot ([place]) 06/19/2020  . HCAP (healthcare-associated pneumonia)   . Elevated troponin level not due myocardial infarction  03/13/2020  . GERD without esophagitis 03/13/2020  . Anemia 03/13/2020  . Thyroid goiter 03/13/2020  . Acute respiratory failure with hypoxia ([place]) 03/13/2020  . Type 2 diabetes mellitus with complication, without long-term current use of insulin ([place]) 03/13/2020  . Elevated liver enzymes 03/13/2020  . Acute on chronic congestive heart failure ([place]) 03/13/2020  . Acute cardiogenic pulmonary edema (HCC) 03/12/2020  . Diabetic ulcer of right foot ([place]) 03/12/2020  . Metatarsalgia of left foot 03/14/2019  . Amputated toe ([place]) 12/06/2018  . Achilles tendon contracture, left 07/24/2016  . Ulcer of left foot, limited to breakdown of skin ([place]) 06/01/2016  . Cellulitis 10/27/2015  . Severe peripheral arterial disease ([place]) 12/28/2014  . Chronic diastolic heart failure ([place]) 12/30/2013  . Edema 12/30/2013  . Mixed hyperlipidemia 06/24/2013  . Mitral valve disorders(424.0) 06/24/2013  . Coronary artery disease involving native coronary artery of native heart   . Essential hypertension   . Diabetes mellitus with peripheral vascular disease ([place])   . Other primary cardiomyopathies     Jamey Reas, PT, DPT 10/19/2020, 12:56 PM  [facility] Physical Therapy [address] [place], [place], [address] Phone: [phone]   Fax:  [phone]  Name: [name] MRN: [id number] Date of Birth: [birth year]

## 2020-10-19 NOTE — Patient Instructions (Signed)
Sweating increases with an amputation. Your body is trying to regulate your temperature & without an extremity, you sweat more easily to cool off. Also prosthetic material like liners do not breath and add hot layers which causes even more sweating. With time your body typically will accommodate to prosthesis and your sweat level will come closer to level with amputation but not pre-amputation level.   You need to pat your limb & liner dry when you notice sweating. If you leave sweat trapped inside your liner, then it can result in a blister.   Signs of sweating in your liner: 1. You are sweating elsewhere on your body or you notice sweat running / dripping.  2. Take note of how high your liner comes up on your limb when you first put your liner on your limb. If you notice that your liner has slipped down, then you probably have sweat inside your liner. A good time to check for liner slippage is when toileting.  3. You feel air bubbles inside your liner. When you liner slips, then air is allowed in bottom. As you put weight on prosthesis, the air is burp or pushed out. 4. You feel something crawling or moving inside your liner. When sweat runs inside the closed system of liner, it often feels like a bug or something crawling inside your liner.  If any of above symptoms are noted, you need to remove your prosthesis & liner to pat your limb & liner dry. This is permanent need as leaving sweat or water trapped can result in a blister or wound.     Hanger Socks: 1-ply is yellow color at top, 3-ply is green at top, 5-ply is navy blue at top How many ply you need depends on your limb size.  You should have even pressure on your limb when standing & walking.  Guidance points: 1. How ease it goes on? Should be some resistance. Too few it goes on too easily. Too many it takes a lot of work to get it on. 2. How many clicks you get. Especially clicks in sitting. 3. After standing or walking, check knee cap.  Bottom should be just under the front lip.  Too few bottom of knee cap sits on indention. Too many bottom is above front lip. 4. Have your feet beside each other & hips over feet. Place hands on your waist. Pelvis Should be level. Too few prosthetic side will be low. Too many prosthetic side will be high.    Get ply socks correct before you leave the house. Take extra socks with you. Take one 3-ply and two 1-ply with you. This is in addition to what you are wearing.

## 2020-10-20 ENCOUNTER — Ambulatory Visit: Payer: HMO | Admitting: Physical Therapy

## 2020-10-20 ENCOUNTER — Encounter: Payer: Self-pay | Admitting: Physical Therapy

## 2020-10-20 DIAGNOSIS — Z9181 History of falling: Secondary | ICD-10-CM

## 2020-10-20 DIAGNOSIS — R293 Abnormal posture: Secondary | ICD-10-CM | POA: Diagnosis not present

## 2020-10-20 DIAGNOSIS — R2681 Unsteadiness on feet: Secondary | ICD-10-CM | POA: Diagnosis not present

## 2020-10-20 DIAGNOSIS — M79661 Pain in right lower leg: Secondary | ICD-10-CM

## 2020-10-20 DIAGNOSIS — M6281 Muscle weakness (generalized): Secondary | ICD-10-CM

## 2020-10-20 DIAGNOSIS — R2689 Other abnormalities of gait and mobility: Secondary | ICD-10-CM

## 2020-10-20 NOTE — Therapy (Signed)
[facility] [address] [place], [place], [address] Phone: [phone]   Fax:  [phone]  Physical Therapy Treatment  Patient Details  Name: [name] MRN: [id number] Date of Birth: 09-26-1931 Referring Provider (PT): [name], [place]   Encounter Date: 10/20/2020   PT End of Session - 10/20/20 1149    Visit Number 3    Number of Visits 25    Date for PT Re-Evaluation 01/06/21    Authorization Type [employer]    Authorization Time Period $20 copay    PT Start Time 1145    PT Stop Time 1231    PT Time Calculation (min) 46 min    Equipment Utilized During Treatment Gait belt    Activity Tolerance Patient tolerated treatment well;Patient limited by pain    Behavior During Therapy [facility] for tasks assessed/performed           Past Medical History:  Diagnosis Date  . Anxiety   . Cellulitis 10/2015   LEFT FOOT  . CHF (congestive heart failure) ([place])   . Complication of anesthesia   . Coronary artery disease   . Diabetes mellitus without complication (HCC)    insulin dependent  . GERD (gastroesophageal reflux disease)   . Hypertension   . Hypothyroidism   . Neuromuscular disorder (HCC)    muscle cramps to lower extremities  . Other primary cardiomyopathies   . Peripheral vascular disease ([place])   . PONV (postoperative nausea and vomiting)   . Shortness of breath     Past Surgical History:  Procedure Laterality Date  . ABDOMINAL AORTOGRAM N/A 09/03/2018   Procedure: ABDOMINAL AORTOGRAM;  Surgeon: Serafina Mitchell, MD;  Location: [place] [facility];  Service: Cardiovascular;  Laterality: N/A;  . ABDOMINAL AORTOGRAM W/LOWER EXTREMITY N/A 04/16/2018   Procedure: ABDOMINAL AORTOGRAM W/LOWER EXTREMITY;  Surgeon: Serafina Mitchell, MD;  Location: [place] [facility];  Service: Cardiovascular;  Laterality: N/A;  unilateral  . ABDOMINAL AORTOGRAM W/LOWER EXTREMITY Bilateral 04/08/2020   Procedure: ABDOMINAL AORTOGRAM W/LOWER  EXTREMITY;  Surgeon: Serafina Mitchell, MD;  Location: [place] [facility];  Service: Cardiovascular;  Laterality: Bilateral;  . ABDOMINAL HYSTERECTOMY    . AMPUTATION Left 12/03/2018   Procedure: Left 3rd ray amputation;  Surgeon: Wylene Simmer, MD;  Location: [place];  Service: Orthopedics;  Laterality: Left;  41min  . AMPUTATION Right 06/22/2020   Procedure: AMPUTATION BELOW KNEE;  Surgeon: Wylene Simmer, MD;  Location: [place];  Service: Orthopedics;  Laterality: Right;  . CHOLECYSTECTOMY    . CORONARY ANGIOPLASTY WITH STENT PLACEMENT  08/09/2011   DES  to mid circumflex  . I & D EXTREMITY Left 10/29/2015   Procedure: Irrigation and Debridement Left Foot;  Surgeon: Newt Minion, MD;  Location: [place];  Service: Orthopedics;  Laterality: Left;  . LEFT HEART CATHETERIZATION WITH CORONARY ANGIOGRAM N/A 08/08/2012   Procedure: LEFT HEART CATHETERIZATION WITH CORONARY ANGIOGRAM;  Surgeon: [name], MD;  Location: [facility] CATH LAB;  Service: Cardiovascular;  Laterality: N/A;  . PERIPHERAL VASCULAR ATHERECTOMY  04/16/2018   Procedure: PERIPHERAL VASCULAR ATHERECTOMY;  Surgeon: Serafina Mitchell, MD;  Location: [place] [facility];  Service: Cardiovascular;;  lt. Peroneal  . PERIPHERAL VASCULAR ATHERECTOMY Right 04/08/2020   Procedure: PERIPHERAL VASCULAR ATHERECTOMY;  Surgeon: Serafina Mitchell, MD;  Location: [place] [facility];  Service: Cardiovascular;  Laterality: Right;  SFA and Peroneal  . PERIPHERAL VASCULAR BALLOON ANGIOPLASTY  04/16/2018   Procedure:  PERIPHERAL VASCULAR BALLOON ANGIOPLASTY;  Surgeon: Serafina Mitchell, MD;  Location: [place] [facility];  Service: Cardiovascular;;  lt. sfa and AT  . PERIPHERAL VASCULAR BALLOON ANGIOPLASTY Left 09/03/2018   Procedure: PERIPHERAL VASCULAR BALLOON ANGIOPLASTY;  Surgeon: Serafina Mitchell, MD;  Location: [place] [facility];  Service: Cardiovascular;  Laterality: Left;  TP TRUNK  . PERIPHERAL VASCULAR BALLOON ANGIOPLASTY Right 04/08/2020    Procedure: PERIPHERAL VASCULAR BALLOON ANGIOPLASTY;  Surgeon: Serafina Mitchell, MD;  Location: [place] [facility];  Service: Cardiovascular;  Laterality: Right;  SFA (DCB), Peroneal  . PERIPHERAL VASCULAR CATHETERIZATION N/A 12/30/2014   Procedure: Lower Extremity Angiography;  Surgeon: Wellington Hampshire, MD;  Location: [place] [facility];  Service: Cardiovascular;  Laterality: N/A;  . PERIPHERAL VASCULAR INTERVENTION Left 09/03/2018   Procedure: PERIPHERAL VASCULAR INTERVENTION;  Surgeon: Serafina Mitchell, MD;  Location: [place] [facility];  Service: Cardiovascular;  Laterality: Left;  SFA STENT   . TENDON RELEASE Right 03/11/2020   Procedure: Heel Cord Lengthening;  Surgeon: Wylene Simmer, MD;  Location: [place];  Service: Orthopedics;  Laterality: Right;  . THYROID SURGERY     radioactive iodine   . TONSILLECTOMY    . TRANSMETATARSAL AMPUTATION Right 03/11/2020   Procedure: Right foot transmetatarsal amputation;  Surgeon: Wylene Simmer, MD;  Location: [place];  Service: Orthopedics;  Laterality: Right;    There were no vitals filed for this visit.   Subjective Assessment - 10/20/20 1145    Subjective She tried sock this morning but could not get prosthesis on limb. She is wearing 5hrs 2x/day but second wear is short due to fact she goes to bed at 7pm to watch tv.    Patient is accompained by: Family member   son, [name]   Pertinent History right TTA, anxiety, CHF, IDDM, HTN, PVD, LE muscle cramps,    Patient Stated Goals to use prosthesis in community & apt complex    Currently in Pain? No/denies                             [facility] Adult PT Treatment/Exercise - 10/20/20 1145      Transfers   Transfers Sit to Stand;Stand to [employer] Transfers    Sit to Stand 5: Supervision;From elevated surface;With upper extremity assist;With armrests;From chair/3-in-1;Other (comment)   to RW & to locked rollator walker   Sit to Stand Details  Visual cues for safe use of DME/AE;Verbal cues for technique    Sit to Stand Details (indicate cue type and reason) worked on standing from chairs without armrests using UEs on chair bottom.    Stand to Sit 5: Supervision;With upper extremity assist;With armrests;To chair/3-in-1;To elevated surface;Other (comment)   from RW & from rollator walker   Stand to Sit Details (indicate cue type and reason) Visual cues for safe use of DME/AE;Verbal cues for technique    Stand to Sit Details worked on sitting to chairs without armrests using UEs on chair bottom.    Stand Pivot Transfers 5: Supervision;With armrests   turning 180* to sit /stand from rollator seat   Stand Pivot Transfer Details (indicate cue type and reason) PT demo & verbal cues on technique to sit / stand from rollator seat. Required rollator to be stabilized against wall.    Comments --      Ambulation/Gait   Ambulation/Gait Yes    Ambulation/Gait Assistance 5: Supervision  Ambulation/Gait Assistance Details PT demo & verbal cues on rollator use & safety.    Ambulation Distance (Feet) 100 Feet   100' RW & 100' rollator   Assistive device Rolling walker;Prosthesis;Rollator    Ambulation Surface Level;Indoor      Prosthetics   Prosthetic Care Comments  PT instructed in donning upon arising. PT recommended attempting to donne liner laying in bed then sitting up to engage pin into prosthesis so can use to walk to bathroom for toileting.  PT recommended walking into & out of bathroom with prosthesis to shower. Removing prosthesis once seated.  Using antiperspirant on limb after showering.    Current prosthetic wear tolerance (days/week)  daily    Current prosthetic wear tolerance (#hours/day)  5 hrs 2x/day    Current prosthetic weight-bearing tolerance (hours/day)  Pt tolerated standing for 5 min with partial weight on prosthesis with anterior tibial pain 5/10    Edema pitting    Residual limb condition  no open areas, frial skin, no  hair growth, normal color & temperature    Education Provided Proper Donning;Proper wear schedule/adjustment;Other (comment);Residual limb care   see prosthetic care comments   Person(s) Educated Patient    Education Method Explanation;Verbal cues;Demonstration    Education Method Verbalized understanding;Verbal cues required;Needs further instruction    Donning Prosthesis Supervision                    PT Short Term Goals - 10/19/20 1252      PT SHORT TERM GOAL #1   Title Patient donnes prosthesis modified independent & verbalizes proper cleaning.    Time 4    Period Weeks    Status On-going    Target Date 11/11/20      PT SHORT TERM GOAL #2   Title Patient tolerates prosthesis >10 hrs total /day without skin issues or limb pain <5/10 after standing.    Time 4    Period Weeks    Status On-going    Target Date 11/11/20      PT SHORT TERM GOAL #3   Title Patient able to pick up items from floor & reach 10" with RW support safely.    Time 4    Period Weeks    Status On-going    Target Date 11/11/20      PT SHORT TERM GOAL #4   Title Patient ambulates 100' with RW & prosthesis with supervision.    Time 4    Period Weeks    Status On-going    Target Date 11/11/20      PT SHORT TERM GOAL #5   Title Patient negotiates ramps & curbs with RW & prosthesis with modA.    Time 4    Period Weeks    Status On-going    Target Date 11/11/20             PT Long Term Goals - 10/19/20 1254      PT LONG TERM GOAL #1   Title Patient demonstrates & verbalized understanding of prosthetic care to enable safe utilization of prosthesis.    Time 12    Period Weeks    Status New    Target Date 01/06/21      PT LONG TERM GOAL #2   Title Patient tolerates prosthesis wear >90% of awake hours without skin or limb pain issues.    Time 12    Period Weeks    Status On-going    Target Date 01/06/21  PT LONG TERM GOAL #3   Title Berg Balance >/= 36/56 to indicate lower  fall risk    Time 12    Period Weeks    Status On-going    Target Date 01/06/21      PT LONG TERM GOAL #4   Title Patient ambulates 300' with LRAD & prosthesis modified independent.    Time 12    Period Weeks    Status On-going    Target Date 01/06/21      PT LONG TERM GOAL #5   Title Patient negotiates ramps, curbs & stairs with LRAD & prosthesis modified independent.    Time 12    Period Weeks    Status On-going    Target Date 01/06/21                 Plan - 10/20/20 1149    Clinical Impression Statement Per patient request PT instructed in rollator walker use. She appears to be safe with std RW around inside of apt but needs additional work before using rollator outside of PT.    Personal Factors and Comorbidities Age;Comorbidity 3+;Fitness;Time since onset of injury/illness/exacerbation;Transportation    Comorbidities right TTA, anxiety, CHF, IDDM, HTN, PVD, LE muscle cramps    Examination-Activity Limitations Lift;Locomotion Level;Squat;Stairs;Stand;Transfers    Examination-Participation Restrictions Community Activity;Meal Prep    Stability/Clinical Decision Making Evolving/Moderate complexity    Rehab Potential Good    PT Frequency 2x / week    PT Duration 12 weeks    PT Treatment/Interventions ADLs/Self Care Home Management;DME Instruction;Gait training;Stair training;Functional mobility training;Therapeutic activities;Therapeutic exercise;Neuromuscular re-education;Patient/family education;Prosthetic Training;Scar mobilization;Passive range of motion    PT Next Visit Plan review prosthetic care, HEP at sink, prosthetic gait with rollator including ramps & curbs    Consulted and Agree with Plan of Care Patient    Family Member Consulted --           Patient will benefit from skilled therapeutic intervention in order to improve the following deficits and impairments:  Abnormal gait,Decreased activity tolerance,Decreased balance,Decreased endurance,Decreased  knowledge of use of DME,Decreased mobility,Decreased skin integrity,Decreased strength,Increased edema,Impaired flexibility,Postural dysfunction,Prosthetic Dependency,Pain  Visit Diagnosis: Other abnormalities of gait and mobility  Unsteadiness on feet  History of falling  Muscle weakness (generalized)  Abnormal posture  Pain in right lower leg     Problem List Patient Active Problem List   Diagnosis Date Noted  . Diabetic infection of right foot ([place]) 06/19/2020  . Chronic osteomyelitis of left foot ([place]) 06/19/2020  . HCAP (healthcare-associated pneumonia)   . Elevated troponin level not due myocardial infarction 03/13/2020  . GERD without esophagitis 03/13/2020  . Anemia 03/13/2020  . Thyroid goiter 03/13/2020  . Acute respiratory failure with hypoxia ([place]) 03/13/2020  . Type 2 diabetes mellitus with complication, without long-term current use of insulin ([place]) 03/13/2020  . Elevated liver enzymes 03/13/2020  . Acute on chronic congestive heart failure ([place]) 03/13/2020  . Acute cardiogenic pulmonary edema (HCC) 03/12/2020  . Diabetic ulcer of right foot ([place]) 03/12/2020  . Metatarsalgia of left foot 03/14/2019  . Amputated toe ([place]) 12/06/2018  . Achilles tendon contracture, left 07/24/2016  . Ulcer of left foot, limited to breakdown of skin ([place]) 06/01/2016  . Cellulitis 10/27/2015  . Severe peripheral arterial disease ([place]) 12/28/2014  . Chronic diastolic heart failure ([place]) 12/30/2013  . Edema 12/30/2013  . Mixed hyperlipidemia 06/24/2013  . Mitral valve disorders(424.0) 06/24/2013  . Coronary artery disease involving native coronary artery of native heart   .  Essential hypertension   . Diabetes mellitus with peripheral vascular disease ([place])   . Other primary cardiomyopathies     Jamey Reas, PT, DPT 10/20/2020, 3:08 PM  [facility] [address] [place], [place], [address] Phone: [phone]   Fax:   [phone]  Name: [name] MRN: [id number] Date of Birth: 10/21/1931

## 2020-10-25 ENCOUNTER — Encounter: Payer: Self-pay | Admitting: Physical Therapy

## 2020-10-25 ENCOUNTER — Other Ambulatory Visit: Payer: Self-pay

## 2020-10-25 ENCOUNTER — Ambulatory Visit: Payer: HMO | Admitting: Physical Therapy

## 2020-10-25 ENCOUNTER — Telehealth: Payer: Self-pay | Admitting: Physical Therapy

## 2020-10-25 DIAGNOSIS — R293 Abnormal posture: Secondary | ICD-10-CM

## 2020-10-25 DIAGNOSIS — R2681 Unsteadiness on feet: Secondary | ICD-10-CM | POA: Diagnosis not present

## 2020-10-25 DIAGNOSIS — M6281 Muscle weakness (generalized): Secondary | ICD-10-CM

## 2020-10-25 DIAGNOSIS — R2689 Other abnormalities of gait and mobility: Secondary | ICD-10-CM | POA: Diagnosis not present

## 2020-10-25 NOTE — Therapy (Signed)
[facility] [address] [place], [place], [address] Phone: [phone]   Fax:  [phone]  Physical Therapy Treatment  Patient Details  Name: [name] MRN: [id number] Date of Birth: May 28, 1932 Referring Provider (PT): Marya Amsler [place], [place]   Encounter Date: 10/25/2020   PT End of Session - 10/25/20 1154    Visit Number 4    Number of Visits 25    Date for PT Re-Evaluation 01/06/21    Authorization Type [employer]    Authorization Time Period $20 copay    PT Start Time 1145    PT Stop Time 1230    PT Time Calculation (min) 45 min    Equipment Utilized During Treatment Gait belt    Activity Tolerance Patient tolerated treatment well;Patient limited by pain    Behavior During Therapy [facility] for tasks assessed/performed           Past Medical History:  Diagnosis Date  . Anxiety   . Cellulitis 10/2015   LEFT FOOT  . CHF (congestive heart failure) ([place])   . Complication of anesthesia   . Coronary artery disease   . Diabetes mellitus without complication (HCC)    insulin dependent  . GERD (gastroesophageal reflux disease)   . Hypertension   . Hypothyroidism   . Neuromuscular disorder (HCC)    muscle cramps to lower extremities  . Other primary cardiomyopathies   . Peripheral vascular disease ([place])   . PONV (postoperative nausea and vomiting)   . Shortness of breath     Past Surgical History:  Procedure Laterality Date  . ABDOMINAL AORTOGRAM N/A 09/03/2018   Procedure: ABDOMINAL AORTOGRAM;  Surgeon: Serafina Mitchell, MD;  Location: [place] [facility];  Service: Cardiovascular;  Laterality: N/A;  . ABDOMINAL AORTOGRAM W/LOWER EXTREMITY N/A 04/16/2018   Procedure: ABDOMINAL AORTOGRAM W/LOWER EXTREMITY;  Surgeon: Serafina Mitchell, MD;  Location: [place] [facility];  Service: Cardiovascular;  Laterality: N/A;  unilateral  . ABDOMINAL AORTOGRAM W/LOWER EXTREMITY Bilateral 04/08/2020   Procedure: ABDOMINAL AORTOGRAM  W/LOWER EXTREMITY;  Surgeon: Serafina Mitchell, MD;  Location: [place] [facility];  Service: Cardiovascular;  Laterality: Bilateral;  . ABDOMINAL HYSTERECTOMY    . AMPUTATION Left 12/03/2018   Procedure: Left 3rd ray amputation;  Surgeon: Wylene Simmer, MD;  Location: [place];  Service: Orthopedics;  Laterality: Left;  40min  . AMPUTATION Right 06/22/2020   Procedure: AMPUTATION BELOW KNEE;  Surgeon: Wylene Simmer, MD;  Location: [place];  Service: Orthopedics;  Laterality: Right;  . CHOLECYSTECTOMY    . CORONARY ANGIOPLASTY WITH STENT PLACEMENT  08/09/2011   DES  to mid circumflex  . I & D EXTREMITY Left 10/29/2015   Procedure: Irrigation and Debridement Left Foot;  Surgeon: Newt Minion, MD;  Location: [place];  Service: Orthopedics;  Laterality: Left;  . LEFT HEART CATHETERIZATION WITH CORONARY ANGIOGRAM N/A 08/08/2012   Procedure: LEFT HEART CATHETERIZATION WITH CORONARY ANGIOGRAM;  Surgeon: [name], MD;  Location: [facility];  Service: Cardiovascular;  Laterality: N/A;  . PERIPHERAL VASCULAR ATHERECTOMY  04/16/2018   Procedure: PERIPHERAL VASCULAR ATHERECTOMY;  Surgeon: Serafina Mitchell, MD;  Location: [place] [facility];  Service: Cardiovascular;;  lt. Peroneal  . PERIPHERAL VASCULAR ATHERECTOMY Right 04/08/2020   Procedure: PERIPHERAL VASCULAR ATHERECTOMY;  Surgeon: Serafina Mitchell, MD;  Location: [place] [facility];  Service: Cardiovascular;  Laterality: Right;  SFA and Peroneal  . PERIPHERAL VASCULAR BALLOON ANGIOPLASTY  04/16/2018   Procedure:  PERIPHERAL VASCULAR BALLOON ANGIOPLASTY;  Surgeon: Serafina Mitchell, MD;  Location: [place] [facility];  Service: Cardiovascular;;  lt. sfa and AT  . PERIPHERAL VASCULAR BALLOON ANGIOPLASTY Left 09/03/2018   Procedure: PERIPHERAL VASCULAR BALLOON ANGIOPLASTY;  Surgeon: Serafina Mitchell, MD;  Location: [place] [facility];  Service: Cardiovascular;  Laterality: Left;  TP TRUNK  . PERIPHERAL VASCULAR BALLOON ANGIOPLASTY Right  04/08/2020   Procedure: PERIPHERAL VASCULAR BALLOON ANGIOPLASTY;  Surgeon: Serafina Mitchell, MD;  Location: [place] [facility];  Service: Cardiovascular;  Laterality: Right;  SFA (DCB), Peroneal  . PERIPHERAL VASCULAR CATHETERIZATION N/A 12/30/2014   Procedure: Lower Extremity Angiography;  Surgeon: Wellington Hampshire, MD;  Location: [place] [facility];  Service: Cardiovascular;  Laterality: N/A;  . PERIPHERAL VASCULAR INTERVENTION Left 09/03/2018   Procedure: PERIPHERAL VASCULAR INTERVENTION;  Surgeon: Serafina Mitchell, MD;  Location: [place] [facility];  Service: Cardiovascular;  Laterality: Left;  SFA STENT   . TENDON RELEASE Right 03/11/2020   Procedure: Heel Cord Lengthening;  Surgeon: Wylene Simmer, MD;  Location: [place];  Service: Orthopedics;  Laterality: Right;  . THYROID SURGERY     radioactive iodine   . TONSILLECTOMY    . TRANSMETATARSAL AMPUTATION Right 03/11/2020   Procedure: Right foot transmetatarsal amputation;  Surgeon: Wylene Simmer, MD;  Location: [place];  Service: Orthopedics;  Laterality: Right;    There were no vitals filed for this visit.   Subjective Assessment - 10/25/20 1145    Subjective She is wearing prosthesis all awake hours except 2 hrs midday.    Patient is accompained by: Family member   son, [name]   Pertinent History right TTA, anxiety, CHF, IDDM, HTN, PVD, LE muscle cramps,    Patient Stated Goals to use prosthesis in community & apt complex    Currently in Pain? No/denies                             [facility] Adult PT Treatment/Exercise - 10/25/20 1145      Transfers   Transfers Sit to Stand;Stand to [employer] Transfers    Sit to Stand --   to RW   Sit to Stand Details --    Stand to Sit 5: Supervision;With upper extremity assist;With armrests;To chair/3-in-1;To elevated surface;Other (comment)   from RW   Stand to Sit Details (indicate cue type and reason) --    Stand Pivot Transfers --       Ambulation/Gait   Ambulation/Gait Yes    Ambulation/Gait Assistance 5: Supervision    Ambulation/Gait Assistance Details how to have family or friend follow with w/c to rest when needed and work on increasing distance that she can walk.  Pt verbalized understanding.    Ambulation Distance (Feet) 210 Feet   130' & 210'   Assistive device Rolling walker;Prosthesis    Ambulation Surface Level;Indoor;Outdoor;Paved      Exercises   Exercises Knee/Hip      Knee/Hip Exercises: Aerobic   Recumbent Bike Seat 6 level 1 for 4 min  PT instructed in technique with TTA prosthesis      Prosthetics   Prosthetic Care Comments  PT instructed in signs of sweating & need to dry limb / liner.  PT instructed in changing shoes only with same pitch / heel height / heel sole differential.  Pt reports Vivewear shrinkers are XL size & fall off.  PT measured thigh & calf  and she should be wearing size L.  PT also recommended setting up appt with prosthetist for alignment adjustment.    Current prosthetic wear tolerance (days/week)  daily    Current prosthetic wear tolerance (#hours/day)  6 hrs 2x/day   starting 4/14 increase to all awake hours drying prn or at least every 5 hrs.   Current prosthetic weight-bearing tolerance (hours/day)  Pt tolerated standing for 5 min with partial weight on prosthesis with no anterior tibial pain    Edema pitting    Residual limb condition  no open areas, frial skin, no hair growth, normal color & temperature    Education Provided Proper wear schedule/adjustment;Other (comment);Residual limb care   see prosthetic care comments   Person(s) Educated Patient    Education Method Explanation;Demonstration;Verbal cues    Education Method Verbalized understanding;Verbal cues required;Needs further instruction    Donning Prosthesis Supervision                    PT Short Term Goals - 10/19/20 1252      PT SHORT TERM GOAL #1   Title Patient donnes prosthesis modified  independent & verbalizes proper cleaning.    Time 4    Period Weeks    Status On-going    Target Date 11/11/20      PT SHORT TERM GOAL #2   Title Patient tolerates prosthesis >10 hrs total /day without skin issues or limb pain <5/10 after standing.    Time 4    Period Weeks    Status On-going    Target Date 11/11/20      PT SHORT TERM GOAL #3   Title Patient able to pick up items from floor & reach 10" with RW support safely.    Time 4    Period Weeks    Status On-going    Target Date 11/11/20      PT SHORT TERM GOAL #4   Title Patient ambulates 100' with RW & prosthesis with supervision.    Time 4    Period Weeks    Status On-going    Target Date 11/11/20      PT SHORT TERM GOAL #5   Title Patient negotiates ramps & curbs with RW & prosthesis with modA.    Time 4    Period Weeks    Status On-going    Target Date 11/11/20             PT Long Term Goals - 10/19/20 1254      PT LONG TERM GOAL #1   Title Patient demonstrates & verbalized understanding of prosthetic care to enable safe utilization of prosthesis.    Time 12    Period Weeks    Status New    Target Date 01/06/21      PT LONG TERM GOAL #2   Title Patient tolerates prosthesis wear >90% of awake hours without skin or limb pain issues.    Time 12    Period Weeks    Status On-going    Target Date 01/06/21      PT LONG TERM GOAL #3   Title Berg Balance >/= 36/56 to indicate lower fall risk    Time 12    Period Weeks    Status On-going    Target Date 01/06/21      PT LONG TERM GOAL #4   Title Patient ambulates 300' with LRAD & prosthesis modified independent.    Time 12    Period Weeks  Status On-going    Target Date 01/06/21      PT LONG TERM GOAL #5   Title Patient negotiates ramps, curbs & stairs with LRAD & prosthesis modified independent.    Time 12    Period Weeks    Status On-going    Target Date 01/06/21                 Plan - 10/25/20 1154    Clinical Impression  Statement Patient appears to need smaller size shrinker as her limb has shrunk.  PT worked on patient improving distance that she can walk before fatigueing and she appears to understand.    Personal Factors and Comorbidities Age;Comorbidity 3+;Fitness;Time since onset of injury/illness/exacerbation;Transportation    Comorbidities right TTA, anxiety, CHF, IDDM, HTN, PVD, LE muscle cramps    Examination-Activity Limitations Lift;Locomotion Level;Squat;Stairs;Stand;Transfers    Examination-Participation Restrictions Community Activity;Meal Prep    Stability/Clinical Decision Making Evolving/Moderate complexity    Rehab Potential Good    PT Frequency 2x / week    PT Duration 12 weeks    PT Treatment/Interventions ADLs/Self Care Home Management;DME Instruction;Gait training;Stair training;Functional mobility training;Therapeutic activities;Therapeutic exercise;Neuromuscular re-education;Patient/family education;Prosthetic Training;Scar mobilization;Passive range of motion    PT Next Visit Plan review prosthetic care, HEP at sink, prosthetic gait with RW or rollator including ramps & curbs    Consulted and Agree with Plan of Care Patient           Patient will benefit from skilled therapeutic intervention in order to improve the following deficits and impairments:  Abnormal gait,Decreased activity tolerance,Decreased balance,Decreased endurance,Decreased knowledge of use of DME,Decreased mobility,Decreased skin integrity,Decreased strength,Increased edema,Impaired flexibility,Postural dysfunction,Prosthetic Dependency,Pain  Visit Diagnosis: Other abnormalities of gait and mobility  Unsteadiness on feet  Muscle weakness (generalized)  Abnormal posture     Problem List Patient Active Problem List   Diagnosis Date Noted  . Diabetic infection of right foot ([place]) 06/19/2020  . Chronic osteomyelitis of left foot ([place]) 06/19/2020  . HCAP (healthcare-associated pneumonia)   . Elevated  troponin level not due myocardial infarction 03/13/2020  . GERD without esophagitis 03/13/2020  . Anemia 03/13/2020  . Thyroid goiter 03/13/2020  . Acute respiratory failure with hypoxia ([place]) 03/13/2020  . Type 2 diabetes mellitus with complication, without long-term current use of insulin ([place]) 03/13/2020  . Elevated liver enzymes 03/13/2020  . Acute on chronic congestive heart failure ([place]) 03/13/2020  . Acute cardiogenic pulmonary edema (HCC) 03/12/2020  . Diabetic ulcer of right foot ([place]) 03/12/2020  . Metatarsalgia of left foot 03/14/2019  . Amputated toe ([place]) 12/06/2018  . Achilles tendon contracture, left 07/24/2016  . Ulcer of left foot, limited to breakdown of skin ([place]) 06/01/2016  . Cellulitis 10/27/2015  . Severe peripheral arterial disease ([place]) 12/28/2014  . Chronic diastolic heart failure ([place]) 12/30/2013  . Edema 12/30/2013  . Mixed hyperlipidemia 06/24/2013  . Mitral valve disorders(424.0) 06/24/2013  . Coronary artery disease involving native coronary artery of native heart   . Essential hypertension   . Diabetes mellitus with peripheral vascular disease ([place])   . Other primary cardiomyopathies     Jamey Reas, PT, DPT 10/25/2020, 2:16 PM  [facility] [address] [place], [place], [address] Phone: [phone]   Fax:  [phone]  Name: [name] MRN: [id number] Date of Birth: Feb 25, 1932

## 2020-10-25 NOTE — Telephone Encounter (Signed)
[name] is reporting that her shrinkers fall off her limb. She currently is wearing size XL.  I measured her limb today and she should now be wearing size Large of the Vivewear shrinker.  Can you please send a prescription to [name] with FAX [phone] ? Please contact me if you have any questions, Jamey Reas, PT, DPT Physical Therapist Specializing in Prosthetic Rehab [facility]. [address] [place], [place] [address] Phone [phone] FAX [phone]

## 2020-10-27 ENCOUNTER — Encounter: Payer: Self-pay | Admitting: Physical Therapy

## 2020-10-27 ENCOUNTER — Other Ambulatory Visit: Payer: Self-pay

## 2020-10-27 ENCOUNTER — Ambulatory Visit: Payer: HMO | Admitting: Physical Therapy

## 2020-10-27 DIAGNOSIS — R2689 Other abnormalities of gait and mobility: Secondary | ICD-10-CM | POA: Diagnosis not present

## 2020-10-27 DIAGNOSIS — R293 Abnormal posture: Secondary | ICD-10-CM

## 2020-10-27 DIAGNOSIS — R2681 Unsteadiness on feet: Secondary | ICD-10-CM | POA: Diagnosis not present

## 2020-10-27 DIAGNOSIS — Z9181 History of falling: Secondary | ICD-10-CM

## 2020-10-27 DIAGNOSIS — M6281 Muscle weakness (generalized): Secondary | ICD-10-CM

## 2020-10-27 NOTE — Therapy (Signed)
[facility] Physical Therapy [address] [place], [place], [address] Phone: [phone]   Fax:  [phone]  Physical Therapy Treatment  Patient Details  Name: [name] MRN: [id number] Date of Birth: [birth year] Referring Provider (PT): [name], [place]   Encounter Date: 10/27/2020   PT End of Session - 10/27/20 1104    Visit Number 5    Number of Visits 25    Date for PT Re-Evaluation 01/06/21    Authorization Type [employer]    Authorization Time Period $20 copay    PT Start Time 1101    PT Stop Time 1145    PT Time Calculation (min) 44 min    Equipment Utilized During Treatment Gait belt    Activity Tolerance Patient tolerated treatment well;Patient limited by pain    Behavior During Therapy [facility] for tasks assessed/performed           Past Medical History:  Diagnosis Date  . Anxiety   . Cellulitis 10/2015   LEFT FOOT  . CHF (congestive heart failure) ([place])   . Complication of anesthesia   . Coronary artery disease   . Diabetes mellitus without complication (HCC)    insulin dependent  . GERD (gastroesophageal reflux disease)   . Hypertension   . Hypothyroidism   . Neuromuscular disorder (HCC)    muscle cramps to lower extremities  . Other primary cardiomyopathies   . Peripheral vascular disease ([place])   . PONV (postoperative nausea and vomiting)   . Shortness of breath     Past Surgical History:  Procedure Laterality Date  . ABDOMINAL AORTOGRAM N/A 09/03/2018   Procedure: ABDOMINAL AORTOGRAM;  Surgeon: Serafina Mitchell, MD;  Location: [place] [facility];  Service: Cardiovascular;  Laterality: N/A;  . ABDOMINAL AORTOGRAM W/LOWER EXTREMITY N/A 04/16/2018   Procedure: ABDOMINAL AORTOGRAM W/LOWER EXTREMITY;  Surgeon: Serafina Mitchell, MD;  Location: [place] [facility];  Service: Cardiovascular;  Laterality: N/A;  unilateral  . ABDOMINAL AORTOGRAM W/LOWER EXTREMITY Bilateral 04/08/2020   Procedure: ABDOMINAL AORTOGRAM  W/LOWER EXTREMITY;  Surgeon: Serafina Mitchell, MD;  Location: [place] [facility];  Service: Cardiovascular;  Laterality: Bilateral;  . ABDOMINAL HYSTERECTOMY    . AMPUTATION Left 12/03/2018   Procedure: Left 3rd ray amputation;  Surgeon: Wylene Simmer, MD;  Location: [place];  Service: Orthopedics;  Laterality: Left;  31min  . AMPUTATION Right 06/22/2020   Procedure: AMPUTATION BELOW KNEE;  Surgeon: Wylene Simmer, MD;  Location: [place];  Service: Orthopedics;  Laterality: Right;  . CHOLECYSTECTOMY    . CORONARY ANGIOPLASTY WITH STENT PLACEMENT  08/09/2011   DES  to mid circumflex  . I & D EXTREMITY Left 10/29/2015   Procedure: Irrigation and Debridement Left Foot;  Surgeon: Newt Minion, MD;  Location: [place];  Service: Orthopedics;  Laterality: Left;  . LEFT HEART CATHETERIZATION WITH CORONARY ANGIOGRAM N/A 08/08/2012   Procedure: LEFT HEART CATHETERIZATION WITH CORONARY ANGIOGRAM;  Surgeon: [name], MD;  Location: [facility];  Service: Cardiovascular;  Laterality: N/A;  . PERIPHERAL VASCULAR ATHERECTOMY  04/16/2018   Procedure: PERIPHERAL VASCULAR ATHERECTOMY;  Surgeon: Serafina Mitchell, MD;  Location: [place] [facility];  Service: Cardiovascular;;  lt. Peroneal  . PERIPHERAL VASCULAR ATHERECTOMY Right 04/08/2020   Procedure: PERIPHERAL VASCULAR ATHERECTOMY;  Surgeon: Serafina Mitchell, MD;  Location: [place] [facility];  Service: Cardiovascular;  Laterality: Right;  SFA and Peroneal  . PERIPHERAL VASCULAR BALLOON ANGIOPLASTY  04/16/2018   Procedure:  PERIPHERAL VASCULAR BALLOON ANGIOPLASTY;  Surgeon: Serafina Mitchell, MD;  Location: [place] [facility];  Service: Cardiovascular;;  lt. sfa and AT  . PERIPHERAL VASCULAR BALLOON ANGIOPLASTY Left 09/03/2018   Procedure: PERIPHERAL VASCULAR BALLOON ANGIOPLASTY;  Surgeon: Serafina Mitchell, MD;  Location: [place] [facility];  Service: Cardiovascular;  Laterality: Left;  TP TRUNK  . PERIPHERAL VASCULAR BALLOON ANGIOPLASTY Right  04/08/2020   Procedure: PERIPHERAL VASCULAR BALLOON ANGIOPLASTY;  Surgeon: Serafina Mitchell, MD;  Location: [place] [facility];  Service: Cardiovascular;  Laterality: Right;  SFA (DCB), Peroneal  . PERIPHERAL VASCULAR CATHETERIZATION N/A 12/30/2014   Procedure: Lower Extremity Angiography;  Surgeon: Wellington Hampshire, MD;  Location: [place] [facility];  Service: Cardiovascular;  Laterality: N/A;  . PERIPHERAL VASCULAR INTERVENTION Left 09/03/2018   Procedure: PERIPHERAL VASCULAR INTERVENTION;  Surgeon: Serafina Mitchell, MD;  Location: [place] [facility];  Service: Cardiovascular;  Laterality: Left;  SFA STENT   . TENDON RELEASE Right 03/11/2020   Procedure: Heel Cord Lengthening;  Surgeon: Wylene Simmer, MD;  Location: [place];  Service: Orthopedics;  Laterality: Right;  . THYROID SURGERY     radioactive iodine   . TONSILLECTOMY    . TRANSMETATARSAL AMPUTATION Right 03/11/2020   Procedure: Right foot transmetatarsal amputation;  Surgeon: Wylene Simmer, MD;  Location: [place];  Service: Orthopedics;  Laterality: Right;    There were no vitals filed for this visit.   Subjective Assessment - 10/27/20 1111    Subjective She is wearing prosthesis all awake hours except 2 hrs midday.    Patient is accompained by: Family member   son, [name]   Pertinent History right TTA, anxiety, CHF, IDDM, HTN, PVD, LE muscle cramps,    Patient Stated Goals to use prosthesis in community & apt complex    Currently in Pain? No/denies                             [facility] Adult PT Treatment/Exercise - 10/27/20 1102      Transfers   Transfers Sit to Stand;Stand to Sit    Sit to Stand 5: Supervision;With upper extremity assist;With armrests;From chair/3-in-1   to RW   Stand to Sit 5: Supervision;With upper extremity assist;With armrests;To chair/3-in-1;To elevated surface;Other (comment)   from RW     Ambulation/Gait   Ambulation/Gait Yes    Ambulation/Gait  Assistance 5: Supervision    Ambulation Distance (Feet) 100 Feet   100' X 2   Assistive device Rolling walker;Prosthesis    Ambulation Surface Level;Indoor    Stairs Yes    Stairs Assistance 4: Min assist    Stairs Assistance Details (indicate cue type and reason) PT demo & verbal cues on technique including 2 rails vs single rail & cane.    Stair Management Technique Two rails;One rail Left;One rail Right;With cane;Step to pattern;Forwards    Number of Stairs 6   2 w/2 rails, 2 w/rt rail & lt cane,  2 w/lt rail & rt cane   Height of Stairs 6    Ramp 4: Min assist   TTA prosthesis, 1 rep with RW & 1 rep with rollator walker   Ramp Details (indicate cue type and reason) PT demo, verbal & tactile cues on technique with TTA prosthesis initially with  RW then with rollator walker    Curb 4: Min assist   Min guard, TTA prosthesis, 1 rep with RW &  1 rep with rollator walker   Curb Details (indicate cue type and reason) PT demo, verbal & tactile cues on technique with TTA prosthesis initially with  RW then with rollator walker      Exercises   Exercises Knee/Hip      Knee/Hip Exercises: Aerobic   Recumbent Bike --      Prosthetics   Prosthetic Care Comments  Increase wear to all awake hours tomorrow drying limb/liner prn if sweating or at least every 5 hrs.    Current prosthetic wear tolerance (days/week)  daily    Current prosthetic wear tolerance (#hours/day)  6 hrs 2x/day    Current prosthetic weight-bearing tolerance (hours/day)  Pt tolerated standing for 5 min with partial weight on prosthesis with no anterior tibial pain    Edema pitting    Residual limb condition  no open areas, frial skin, no hair growth, normal color & temperature    Education Provided Proper wear schedule/adjustment;Other (comment);Residual limb care   see prosthetic care comments   Person(s) Educated Patient    Education Method Explanation;Verbal cues    Education Method Verbalized understanding;Verbal cues  required;Needs further instruction    Donning Prosthesis Modified independent (device/increased time)                    PT Short Term Goals - 10/19/20 1252      PT SHORT TERM GOAL #1   Title Patient donnes prosthesis modified independent & verbalizes proper cleaning.    Time 4    Period Weeks    Status On-going    Target Date 11/11/20      PT SHORT TERM GOAL #2   Title Patient tolerates prosthesis >10 hrs total /day without skin issues or limb pain <5/10 after standing.    Time 4    Period Weeks    Status On-going    Target Date 11/11/20      PT SHORT TERM GOAL #3   Title Patient able to pick up items from floor & reach 10" with RW support safely.    Time 4    Period Weeks    Status On-going    Target Date 11/11/20      PT SHORT TERM GOAL #4   Title Patient ambulates 100' with RW & prosthesis with supervision.    Time 4    Period Weeks    Status On-going    Target Date 11/11/20      PT SHORT TERM GOAL #5   Title Patient negotiates ramps & curbs with RW & prosthesis with modA.    Time 4    Period Weeks    Status On-going    Target Date 11/11/20             PT Long Term Goals - 10/19/20 1254      PT LONG TERM GOAL #1   Title Patient demonstrates & verbalized understanding of prosthetic care to enable safe utilization of prosthesis.    Time 12    Period Weeks    Status New    Target Date 01/06/21      PT LONG TERM GOAL #2   Title Patient tolerates prosthesis wear >90% of awake hours without skin or limb pain issues.    Time 12    Period Weeks    Status On-going    Target Date 01/06/21      PT LONG TERM GOAL #3   Title Berg Balance >/= 36/56 to indicate lower fall risk  Time 12    Period Weeks    Status On-going    Target Date 01/06/21      PT LONG TERM GOAL #4   Title Patient ambulates 300' with LRAD & prosthesis modified independent.    Time 12    Period Weeks    Status On-going    Target Date 01/06/21      PT LONG TERM GOAL #5    Title Patient negotiates ramps, curbs & stairs with LRAD & prosthesis modified independent.    Time 12    Period Weeks    Status On-going    Target Date 01/06/21                 Plan - 10/27/20 1110    Clinical Impression Statement PT instructed in negotiating ramps & curbs with RW & with rollator walker which she improved with instructions but needs more work to be safe without assistance.  PT also instructed in negotiating stairs which she improved but needs additional instruction.    Personal Factors and Comorbidities Age;Comorbidity 3+;Fitness;Time since onset of injury/illness/exacerbation;Transportation    Comorbidities right TTA, anxiety, CHF, IDDM, HTN, PVD, LE muscle cramps    Examination-Activity Limitations Lift;Locomotion Level;Squat;Stairs;Stand;Transfers    Examination-Participation Restrictions Community Activity;Meal Prep    Stability/Clinical Decision Making Evolving/Moderate complexity    Rehab Potential Good    PT Frequency 2x / week    PT Duration 12 weeks    PT Treatment/Interventions ADLs/Self Care Home Management;DME Instruction;Gait training;Stair training;Functional mobility training;Therapeutic activities;Therapeutic exercise;Neuromuscular re-education;Patient/family education;Prosthetic Training;Scar mobilization;Passive range of motion    PT Next Visit Plan review prosthetic care, HEP at sink, prosthetic gait with RW or rollator including ramps & curbs    Consulted and Agree with Plan of Care Patient           Patient will benefit from skilled therapeutic intervention in order to improve the following deficits and impairments:  Abnormal gait,Decreased activity tolerance,Decreased balance,Decreased endurance,Decreased knowledge of use of DME,Decreased mobility,Decreased skin integrity,Decreased strength,Increased edema,Impaired flexibility,Postural dysfunction,Prosthetic Dependency,Pain  Visit Diagnosis: Other abnormalities of gait and  mobility  Unsteadiness on feet  Muscle weakness (generalized)  Abnormal posture  History of falling     Problem List Patient Active Problem List   Diagnosis Date Noted  . Diabetic infection of right foot ([place]) 06/19/2020  . Chronic osteomyelitis of left foot ([place]) 06/19/2020  . HCAP (healthcare-associated pneumonia)   . Elevated troponin level not due myocardial infarction 03/13/2020  . GERD without esophagitis 03/13/2020  . Anemia 03/13/2020  . Thyroid goiter 03/13/2020  . Acute respiratory failure with hypoxia ([place]) 03/13/2020  . Type 2 diabetes mellitus with complication, without long-term current use of insulin ([place]) 03/13/2020  . Elevated liver enzymes 03/13/2020  . Acute on chronic congestive heart failure ([place]) 03/13/2020  . Acute cardiogenic pulmonary edema (HCC) 03/12/2020  . Diabetic ulcer of right foot ([place]) 03/12/2020  . Metatarsalgia of left foot 03/14/2019  . Amputated toe ([place]) 12/06/2018  . Achilles tendon contracture, left 07/24/2016  . Ulcer of left foot, limited to breakdown of skin ([place]) 06/01/2016  . Cellulitis 10/27/2015  . Severe peripheral arterial disease ([place]) 12/28/2014  . Chronic diastolic heart failure ([place]) 12/30/2013  . Edema 12/30/2013  . Mixed hyperlipidemia 06/24/2013  . Mitral valve disorders(424.0) 06/24/2013  . Coronary artery disease involving native coronary artery of native heart   . Essential hypertension   . Diabetes mellitus with peripheral vascular disease ([place])   . Other primary cardiomyopathies  Jamey Reas, PT, DPT 10/27/2020, 3:35 PM  [facility] [address] [place], [place], [address] Phone: [phone]   Fax:  [phone]  Name: [name] MRN: [id number] Date of Birth: January 05, 1932

## 2020-11-01 ENCOUNTER — Other Ambulatory Visit: Payer: Self-pay

## 2020-11-01 ENCOUNTER — Encounter: Payer: Self-pay | Admitting: Physical Therapy

## 2020-11-01 ENCOUNTER — Ambulatory Visit: Payer: HMO | Admitting: Physical Therapy

## 2020-11-01 DIAGNOSIS — Z9181 History of falling: Secondary | ICD-10-CM | POA: Diagnosis not present

## 2020-11-01 DIAGNOSIS — R2689 Other abnormalities of gait and mobility: Secondary | ICD-10-CM

## 2020-11-01 DIAGNOSIS — M6281 Muscle weakness (generalized): Secondary | ICD-10-CM | POA: Diagnosis not present

## 2020-11-01 DIAGNOSIS — R2681 Unsteadiness on feet: Secondary | ICD-10-CM

## 2020-11-01 DIAGNOSIS — R293 Abnormal posture: Secondary | ICD-10-CM | POA: Diagnosis not present

## 2020-11-01 NOTE — Therapy (Signed)
[facility] Physical Therapy [address] [place], [place], [address] Phone: [phone]   Fax:  [phone]  Physical Therapy Treatment  Patient Details  Name: [name] MRN: [id number] Date of Birth: 12/30/1931 Referring Provider (PT): Marya Amsler [place], [place]   Encounter Date: 11/01/2020   PT End of Session - 11/01/20 1106    Visit Number 6    Number of Visits 25    Date for PT Re-Evaluation 01/06/21    Authorization Type [employer]    Authorization Time Period $20 copay    PT Start Time 1102    PT Stop Time 1145    PT Time Calculation (min) 43 min    Equipment Utilized During Treatment Gait belt    Activity Tolerance Patient tolerated treatment well;Patient limited by pain    Behavior During Therapy [facility] for tasks assessed/performed           Past Medical History:  Diagnosis Date  . Anxiety   . Cellulitis 10/2015   LEFT FOOT  . CHF (congestive heart failure) ([place])   . Complication of anesthesia   . Coronary artery disease   . Diabetes mellitus without complication (HCC)    insulin dependent  . GERD (gastroesophageal reflux disease)   . Hypertension   . Hypothyroidism   . Neuromuscular disorder (HCC)    muscle cramps to lower extremities  . Other primary cardiomyopathies   . Peripheral vascular disease ([place])   . PONV (postoperative nausea and vomiting)   . Shortness of breath     Past Surgical History:  Procedure Laterality Date  . ABDOMINAL AORTOGRAM N/A 09/03/2018   Procedure: ABDOMINAL AORTOGRAM;  Surgeon: Serafina Mitchell, MD;  Location: [place] [facility];  Service: Cardiovascular;  Laterality: N/A;  . ABDOMINAL AORTOGRAM W/LOWER EXTREMITY N/A 04/16/2018   Procedure: ABDOMINAL AORTOGRAM W/LOWER EXTREMITY;  Surgeon: Serafina Mitchell, MD;  Location: [place] [facility];  Service: Cardiovascular;  Laterality: N/A;  unilateral  . ABDOMINAL AORTOGRAM W/LOWER EXTREMITY Bilateral 04/08/2020   Procedure: ABDOMINAL AORTOGRAM  W/LOWER EXTREMITY;  Surgeon: Serafina Mitchell, MD;  Location: [place] [facility];  Service: Cardiovascular;  Laterality: Bilateral;  . ABDOMINAL HYSTERECTOMY    . AMPUTATION Left 12/03/2018   Procedure: Left 3rd ray amputation;  Surgeon: Wylene Simmer, MD;  Location: [place];  Service: Orthopedics;  Laterality: Left;  27min  . AMPUTATION Right 06/22/2020   Procedure: AMPUTATION BELOW KNEE;  Surgeon: Wylene Simmer, MD;  Location: [place];  Service: Orthopedics;  Laterality: Right;  . CHOLECYSTECTOMY    . CORONARY ANGIOPLASTY WITH STENT PLACEMENT  08/09/2011   DES  to mid circumflex  . I & D EXTREMITY Left 10/29/2015   Procedure: Irrigation and Debridement Left Foot;  Surgeon: Newt Minion, MD;  Location: [place];  Service: Orthopedics;  Laterality: Left;  . LEFT HEART CATHETERIZATION WITH CORONARY ANGIOGRAM N/A 08/08/2012   Procedure: LEFT HEART CATHETERIZATION WITH CORONARY ANGIOGRAM;  Surgeon: [name], MD;  Location: [facility];  Service: Cardiovascular;  Laterality: N/A;  . PERIPHERAL VASCULAR ATHERECTOMY  04/16/2018   Procedure: PERIPHERAL VASCULAR ATHERECTOMY;  Surgeon: Serafina Mitchell, MD;  Location: [place] [facility];  Service: Cardiovascular;;  lt. Peroneal  . PERIPHERAL VASCULAR ATHERECTOMY Right 04/08/2020   Procedure: PERIPHERAL VASCULAR ATHERECTOMY;  Surgeon: Serafina Mitchell, MD;  Location: [place] [facility];  Service: Cardiovascular;  Laterality: Right;  SFA and Peroneal  . PERIPHERAL VASCULAR BALLOON ANGIOPLASTY  04/16/2018   Procedure:  PERIPHERAL VASCULAR BALLOON ANGIOPLASTY;  Surgeon: Serafina Mitchell, MD;  Location: [place] [facility];  Service: Cardiovascular;;  lt. sfa and AT  . PERIPHERAL VASCULAR BALLOON ANGIOPLASTY Left 09/03/2018   Procedure: PERIPHERAL VASCULAR BALLOON ANGIOPLASTY;  Surgeon: Serafina Mitchell, MD;  Location: [place] [facility];  Service: Cardiovascular;  Laterality: Left;  TP TRUNK  . PERIPHERAL VASCULAR BALLOON ANGIOPLASTY Right  04/08/2020   Procedure: PERIPHERAL VASCULAR BALLOON ANGIOPLASTY;  Surgeon: Serafina Mitchell, MD;  Location: [place] [facility];  Service: Cardiovascular;  Laterality: Right;  SFA (DCB), Peroneal  . PERIPHERAL VASCULAR CATHETERIZATION N/A 12/30/2014   Procedure: Lower Extremity Angiography;  Surgeon: Wellington Hampshire, MD;  Location: [place] [facility];  Service: Cardiovascular;  Laterality: N/A;  . PERIPHERAL VASCULAR INTERVENTION Left 09/03/2018   Procedure: PERIPHERAL VASCULAR INTERVENTION;  Surgeon: Serafina Mitchell, MD;  Location: [place] [facility];  Service: Cardiovascular;  Laterality: Left;  SFA STENT   . TENDON RELEASE Right 03/11/2020   Procedure: Heel Cord Lengthening;  Surgeon: Wylene Simmer, MD;  Location: [place];  Service: Orthopedics;  Laterality: Right;  . THYROID SURGERY     radioactive iodine   . TONSILLECTOMY    . TRANSMETATARSAL AMPUTATION Right 03/11/2020   Procedure: Right foot transmetatarsal amputation;  Surgeon: Wylene Simmer, MD;  Location: [place];  Service: Orthopedics;  Laterality: Right;    There were no vitals filed for this visit.   Subjective Assessment - 11/01/20 1102    Subjective She went to her son's for Easter and was able to do single step at entrance.    Patient is accompained by: Family member   son, [name]   Pertinent History right TTA, anxiety, CHF, IDDM, HTN, PVD, LE muscle cramps,    Patient Stated Goals to use prosthesis in community & apt complex    Currently in Pain? No/denies                             [facility] Adult PT Treatment/Exercise - 11/01/20 1102      Transfers   Transfers Sit to Stand;Stand to Sit    Sit to Stand 5: Supervision;With upper extremity assist;With armrests;From chair/3-in-1   to RW   Stand to Sit 5: Supervision;With upper extremity assist;With armrests;To chair/3-in-1;To elevated surface;Other (comment)   from RW     Ambulation/Gait   Ambulation/Gait Yes     Ambulation/Gait Assistance 5: Supervision    Ambulation Distance (Feet) 100 Feet   100' X 2   Assistive device Rolling walker;Prosthesis    Stairs --    Stairs Assistance --    Stair Management Technique --    Number of Stairs --    Height of Stairs --    Ramp --    Curb --      High Level Balance   High Level Balance Activities Side stepping;Backward walking    High Level Balance Comments in //bars with BUE support.  tactile & verbal cues on technique.      Exercises   Exercises Knee/Hip      Knee/Hip Exercises: Standing   Forward Step Up Right;Left;1 set;5 reps;Hand Hold: 2;Step Height: 4"    Forward Step Up Limitations demo & verbal cues on technique    Step Down Right;Left;1 set;5 reps;Hand Hold: 2;Step Height: 4"    Step Down Limitations demo & verbal cues on technique    Rocker Board 1 minute  ant/level/post and right/level/left   Rocker Board Limitations square board, BUE support, demo & verbal cues on technique.      Prosthetics   Prosthetic Care Comments  Continue wear to all awake hours tomorrow drying limb/liner prn if sweating or at least every 5 hrs.    Current prosthetic wear tolerance (days/week)  daily    Current prosthetic wear tolerance (#hours/day)  all awake hours    Current prosthetic weight-bearing tolerance (hours/day)  Pt tolerated standing for 5 min with partial weight on prosthesis with no anterior tibial pain    Edema pitting    Residual limb condition  no open areas, frial skin, no hair growth, normal color & temperature    Education Provided Proper wear schedule/adjustment;Other (comment);Residual limb care   see prosthetic care comments   Person(s) Educated Patient    Education Method Explanation;Verbal cues    Education Method Verbalized understanding;Verbal cues required;Needs further instruction               Balance Exercises - 11/01/20 1102      Balance Exercises: Standing   Standing Eyes Opened Wide ([place]);Foam/compliant surface;Head  turns;5 reps   right/left, up/down & diagonal   Standing Eyes Opened Limitations MinA / tactile, verbal & visual cues on balance reactions    Standing Eyes Closed Wide (BOA);Solid surface;Head turns;5 reps   right/left, up/down & diagonal   Standing Eyes Closed Limitations MinA / tactile, verbal & visual cues on balance reactions               PT Short Term Goals - 10/19/20 1252      PT SHORT TERM GOAL #1   Title Patient donnes prosthesis modified independent & verbalizes proper cleaning.    Time 4    Period Weeks    Status On-going    Target Date 11/11/20      PT SHORT TERM GOAL #2   Title Patient tolerates prosthesis >10 hrs total /day without skin issues or limb pain <5/10 after standing.    Time 4    Period Weeks    Status On-going    Target Date 11/11/20      PT SHORT TERM GOAL #3   Title Patient able to pick up items from floor & reach 10" with RW support safely.    Time 4    Period Weeks    Status On-going    Target Date 11/11/20      PT SHORT TERM GOAL #4   Title Patient ambulates 100' with RW & prosthesis with supervision.    Time 4    Period Weeks    Status On-going    Target Date 11/11/20      PT SHORT TERM GOAL #5   Title Patient negotiates ramps & curbs with RW & prosthesis with modA.    Time 4    Period Weeks    Status On-going    Target Date 11/11/20             PT Long Term Goals - 10/19/20 1254      PT LONG TERM GOAL #1   Title Patient demonstrates & verbalized understanding of prosthetic care to enable safe utilization of prosthesis.    Time 12    Period Weeks    Status New    Target Date 01/06/21      PT LONG TERM GOAL #2   Title Patient tolerates prosthesis wear >90% of awake hours without skin or limb pain issues.  Time 12    Period Weeks    Status On-going    Target Date 01/06/21      PT LONG TERM GOAL #3   Title Berg Balance >/= 36/56 to indicate lower fall risk    Time 12    Period Weeks    Status On-going    Target  Date 01/06/21      PT LONG TERM GOAL #4   Title Patient ambulates 300' with LRAD & prosthesis modified independent.    Time 12    Period Weeks    Status On-going    Target Date 01/06/21      PT LONG TERM GOAL #5   Title Patient negotiates ramps, curbs & stairs with LRAD & prosthesis modified independent.    Time 12    Period Weeks    Status On-going    Target Date 01/06/21                 Plan - 11/01/20 1107    Clinical Impression Statement PT session worked on balance activities facilitating ankle / residual limb and hip strategies. She improved with skilled instructions & repetitions but will need more work.    Personal Factors and Comorbidities Age;Comorbidity 3+;Fitness;Time since onset of injury/illness/exacerbation;Transportation    Comorbidities right TTA, anxiety, CHF, IDDM, HTN, PVD, LE muscle cramps    Examination-Activity Limitations Lift;Locomotion Level;Squat;Stairs;Stand;Transfers    Examination-Participation Restrictions Community Activity;Meal Prep    Stability/Clinical Decision Making Evolving/Moderate complexity    Rehab Potential Good    PT Frequency 2x / week    PT Duration 12 weeks    PT Treatment/Interventions ADLs/Self Care Home Management;DME Instruction;Gait training;Stair training;Functional mobility training;Therapeutic activities;Therapeutic exercise;Neuromuscular re-education;Patient/family education;Prosthetic Training;Scar mobilization;Passive range of motion    PT Next Visit Plan review prosthetic care, HEP at sink, prosthetic gait with RW or rollator including ramps & curbs    Consulted and Agree with Plan of Care Patient           Patient will benefit from skilled therapeutic intervention in order to improve the following deficits and impairments:  Abnormal gait,Decreased activity tolerance,Decreased balance,Decreased endurance,Decreased knowledge of use of DME,Decreased mobility,Decreased skin integrity,Decreased strength,Increased  edema,Impaired flexibility,Postural dysfunction,Prosthetic Dependency,Pain  Visit Diagnosis: Other abnormalities of gait and mobility  Unsteadiness on feet  Muscle weakness (generalized)  Abnormal posture  History of falling     Problem List Patient Active Problem List   Diagnosis Date Noted  . Diabetic infection of right foot ([place]) 06/19/2020  . Chronic osteomyelitis of left foot ([place]) 06/19/2020  . HCAP (healthcare-associated pneumonia)   . Elevated troponin level not due myocardial infarction 03/13/2020  . GERD without esophagitis 03/13/2020  . Anemia 03/13/2020  . Thyroid goiter 03/13/2020  . Acute respiratory failure with hypoxia ([place]) 03/13/2020  . Type 2 diabetes mellitus with complication, without long-term current use of insulin ([place]) 03/13/2020  . Elevated liver enzymes 03/13/2020  . Acute on chronic congestive heart failure ([place]) 03/13/2020  . Acute cardiogenic pulmonary edema (HCC) 03/12/2020  . Diabetic ulcer of right foot ([place]) 03/12/2020  . Metatarsalgia of left foot 03/14/2019  . Amputated toe ([place]) 12/06/2018  . Achilles tendon contracture, left 07/24/2016  . Ulcer of left foot, limited to breakdown of skin ([place]) 06/01/2016  . Cellulitis 10/27/2015  . Severe peripheral arterial disease ([place]) 12/28/2014  . Chronic diastolic heart failure ([place]) 12/30/2013  . Edema 12/30/2013  . Mixed hyperlipidemia 06/24/2013  . Mitral valve disorders(424.0) 06/24/2013  . Coronary artery disease involving native coronary  artery of native heart   . Essential hypertension   . Diabetes mellitus with peripheral vascular disease ([place])   . Other primary cardiomyopathies     Jamey Reas, PT, DPT 11/01/2020, 5:23 PM  [facility] [address] [place], [place], [address] Phone: [phone]   Fax:  [phone]  Name: [name] MRN: [id number] Date of Birth: 27-Oct-1931

## 2020-11-02 DIAGNOSIS — I739 Peripheral vascular disease, unspecified: Secondary | ICD-10-CM | POA: Diagnosis not present

## 2020-11-02 DIAGNOSIS — E1140 Type 2 diabetes mellitus with diabetic neuropathy, unspecified: Secondary | ICD-10-CM | POA: Diagnosis not present

## 2020-11-02 DIAGNOSIS — E1159 Type 2 diabetes mellitus with other circulatory complications: Secondary | ICD-10-CM | POA: Diagnosis not present

## 2020-11-02 DIAGNOSIS — N182 Chronic kidney disease, stage 2 (mild): Secondary | ICD-10-CM | POA: Diagnosis not present

## 2020-11-02 DIAGNOSIS — I5022 Chronic systolic (congestive) heart failure: Secondary | ICD-10-CM | POA: Diagnosis not present

## 2020-11-02 DIAGNOSIS — E0590 Thyrotoxicosis, unspecified without thyrotoxic crisis or storm: Secondary | ICD-10-CM | POA: Diagnosis not present

## 2020-11-02 DIAGNOSIS — R7989 Other specified abnormal findings of blood chemistry: Secondary | ICD-10-CM | POA: Diagnosis not present

## 2020-11-02 DIAGNOSIS — I130 Hypertensive heart and chronic kidney disease with heart failure and stage 1 through stage 4 chronic kidney disease, or unspecified chronic kidney disease: Secondary | ICD-10-CM | POA: Diagnosis not present

## 2020-11-02 DIAGNOSIS — D649 Anemia, unspecified: Secondary | ICD-10-CM | POA: Diagnosis not present

## 2020-11-02 DIAGNOSIS — E785 Hyperlipidemia, unspecified: Secondary | ICD-10-CM | POA: Diagnosis not present

## 2020-11-02 DIAGNOSIS — M8580 Other specified disorders of bone density and structure, unspecified site: Secondary | ICD-10-CM | POA: Diagnosis not present

## 2020-11-02 DIAGNOSIS — E1129 Type 2 diabetes mellitus with other diabetic kidney complication: Secondary | ICD-10-CM | POA: Diagnosis not present

## 2020-11-02 DIAGNOSIS — I2510 Atherosclerotic heart disease of native coronary artery without angina pectoris: Secondary | ICD-10-CM | POA: Diagnosis not present

## 2020-11-03 ENCOUNTER — Other Ambulatory Visit: Payer: Self-pay

## 2020-11-03 ENCOUNTER — Encounter: Payer: Self-pay | Admitting: Physical Therapy

## 2020-11-03 ENCOUNTER — Ambulatory Visit (INDEPENDENT_AMBULATORY_CARE_PROVIDER_SITE_OTHER): Payer: HMO | Admitting: Physical Therapy

## 2020-11-03 DIAGNOSIS — R2681 Unsteadiness on feet: Secondary | ICD-10-CM

## 2020-11-03 DIAGNOSIS — R293 Abnormal posture: Secondary | ICD-10-CM | POA: Diagnosis not present

## 2020-11-03 DIAGNOSIS — R2689 Other abnormalities of gait and mobility: Secondary | ICD-10-CM | POA: Diagnosis not present

## 2020-11-03 DIAGNOSIS — M6281 Muscle weakness (generalized): Secondary | ICD-10-CM | POA: Diagnosis not present

## 2020-11-03 DIAGNOSIS — Z9181 History of falling: Secondary | ICD-10-CM

## 2020-11-03 NOTE — Therapy (Signed)
[facility] [address] [place], [place], [address] Phone: [phone]   Fax:  [phone]  Physical Therapy Treatment  Patient Details  Name: LYRAH BRADT MRN: [id number] Date of Birth: 09-19-1931 Referring Provider (PT): Marya Amsler [place], [place]   Encounter Date: 11/03/2020   PT End of Session - 11/03/20 1105    Visit Number 7    Number of Visits 25    Date for PT Re-Evaluation 01/06/21    Authorization Type [employer]    Authorization Time Period $20 copay    PT Start Time 1100    PT Stop Time 1145    PT Time Calculation (min) 45 min    Equipment Utilized During Treatment Gait belt    Activity Tolerance Patient tolerated treatment well;Patient limited by pain    Behavior During Therapy [facility] for tasks assessed/performed           Past Medical History:  Diagnosis Date  . Anxiety   . Cellulitis 10/2015   LEFT FOOT  . CHF (congestive heart failure) ([place])   . Complication of anesthesia   . Coronary artery disease   . Diabetes mellitus without complication (HCC)    insulin dependent  . GERD (gastroesophageal reflux disease)   . Hypertension   . Hypothyroidism   . Neuromuscular disorder (HCC)    muscle cramps to lower extremities  . Other primary cardiomyopathies   . Peripheral vascular disease ([place])   . PONV (postoperative nausea and vomiting)   . Shortness of breath     Past Surgical History:  Procedure Laterality Date  . ABDOMINAL AORTOGRAM N/A 09/03/2018   Procedure: ABDOMINAL AORTOGRAM;  Surgeon: Serafina Mitchell, MD;  Location: [place] [facility];  Service: Cardiovascular;  Laterality: N/A;  . ABDOMINAL AORTOGRAM W/LOWER EXTREMITY N/A 04/16/2018   Procedure: ABDOMINAL AORTOGRAM W/LOWER EXTREMITY;  Surgeon: Serafina Mitchell, MD;  Location: [place] [facility];  Service: Cardiovascular;  Laterality: N/A;  unilateral  . ABDOMINAL AORTOGRAM W/LOWER EXTREMITY Bilateral 04/08/2020   Procedure: ABDOMINAL AORTOGRAM  W/LOWER EXTREMITY;  Surgeon: Serafina Mitchell, MD;  Location: [place] [facility];  Service: Cardiovascular;  Laterality: Bilateral;  . ABDOMINAL HYSTERECTOMY    . AMPUTATION Left 12/03/2018   Procedure: Left 3rd ray amputation;  Surgeon: Wylene Simmer, MD;  Location: [place];  Service: Orthopedics;  Laterality: Left;  48min  . AMPUTATION Right 06/22/2020   Procedure: AMPUTATION BELOW KNEE;  Surgeon: Wylene Simmer, MD;  Location: [place];  Service: Orthopedics;  Laterality: Right;  . CHOLECYSTECTOMY    . CORONARY ANGIOPLASTY WITH STENT PLACEMENT  08/09/2011   DES  to mid circumflex  . I & D EXTREMITY Left 10/29/2015   Procedure: Irrigation and Debridement Left Foot;  Surgeon: Newt Minion, MD;  Location: [place];  Service: Orthopedics;  Laterality: Left;  . LEFT HEART CATHETERIZATION WITH CORONARY ANGIOGRAM N/A 08/08/2012   Procedure: LEFT HEART CATHETERIZATION WITH CORONARY ANGIOGRAM;  Surgeon: [name], MD;  Location: [facility] C[facility] LAB;  Service: Cardiovascular;  Laterality: N/A;  . PERIPHERAL VASCULAR ATHERECTOMY  04/16/2018   Procedure: PERIPHERAL VASCULAR ATHERECTOMY;  Surgeon: Serafina Mitchell, MD;  Location: [place] [facility];  Service: Cardiovascular;;  lt. Peroneal  . PERIPHERAL VASCULAR ATHERECTOMY Right 04/08/2020   Procedure: PERIPHERAL VASCULAR ATHERECTOMY;  Surgeon: Serafina Mitchell, MD;  Location: [place] [facility];  Service: Cardiovascular;  Laterality: Right;  SFA and Peroneal  . PERIPHERAL VASCULAR BALLOON ANGIOPLASTY  04/16/2018   Procedure:  PERIPHERAL VASCULAR BALLOON ANGIOPLASTY;  Surgeon: Serafina Mitchell, MD;  Location: [place] [facility];  Service: Cardiovascular;;  lt. sfa and AT  . PERIPHERAL VASCULAR BALLOON ANGIOPLASTY Left 09/03/2018   Procedure: PERIPHERAL VASCULAR BALLOON ANGIOPLASTY;  Surgeon: Serafina Mitchell, MD;  Location: [place] [facility];  Service: Cardiovascular;  Laterality: Left;  TP TRUNK  . PERIPHERAL VASCULAR BALLOON ANGIOPLASTY Right  04/08/2020   Procedure: PERIPHERAL VASCULAR BALLOON ANGIOPLASTY;  Surgeon: Serafina Mitchell, MD;  Location: [place] [facility];  Service: Cardiovascular;  Laterality: Right;  SFA (DCB), Peroneal  . PERIPHERAL VASCULAR CATHETERIZATION N/A 12/30/2014   Procedure: Lower Extremity Angiography;  Surgeon: Wellington Hampshire, MD;  Location: [place] [facility];  Service: Cardiovascular;  Laterality: N/A;  . PERIPHERAL VASCULAR INTERVENTION Left 09/03/2018   Procedure: PERIPHERAL VASCULAR INTERVENTION;  Surgeon: Serafina Mitchell, MD;  Location: [place] [facility];  Service: Cardiovascular;  Laterality: Left;  SFA STENT   . TENDON RELEASE Right 03/11/2020   Procedure: Heel Cord Lengthening;  Surgeon: Wylene Simmer, MD;  Location: [place];  Service: Orthopedics;  Laterality: Right;  . THYROID SURGERY     radioactive iodine   . TONSILLECTOMY    . TRANSMETATARSAL AMPUTATION Right 03/11/2020   Procedure: Right foot transmetatarsal amputation;  Surgeon: Wylene Simmer, MD;  Location: [place];  Service: Orthopedics;  Laterality: Right;    There were no vitals filed for this visit.   Subjective Assessment - 11/03/20 1100    Subjective She is wearing prosthesis all awake hours drying 2x/day.  Her left shoulder hurts.    Patient is accompained by: Family member   son, Sharley Keeler   Pertinent History right TTA, anxiety, CHF, IDDM, HTN, PVD, LE muscle cramps,    Patient Stated Goals to use prosthesis in community & apt complex    Currently in Pain? Yes    Pain Score 7     Pain Location Shoulder    Pain Orientation Left    Pain Descriptors / Indicators Sore    Pain Type Other (Comment)   arthritic   Pain Onset 1 to 4 weeks ago    Pain Frequency Intermittent    Aggravating Factors  using arm    Pain Relieving Factors resting it                             [facility] Adult PT Treatment/Exercise - 11/03/20 1100      Transfers   Transfers Sit to Stand;Stand to Sit     Sit to Stand 5: Supervision;With upper extremity assist;With armrests;From chair/3-in-1   to RW   Stand to Sit 5: Supervision;With upper extremity assist;With armrests;To chair/3-in-1;To elevated surface;Other (comment)   from RW     Ambulation/Gait   Ambulation/Gait Yes    Ambulation/Gait Assistance 5: Supervision    Ambulation Distance (Feet) 100 Feet   100' X 2   Assistive device Rolling walker;Prosthesis      High Level Balance   High Level Balance Activities --    High Level Balance Comments --      Exercises   Exercises Knee/Hip      Knee/Hip Exercises: Aerobic   Nustep Seat 11 level 6 for 8 min      Knee/Hip Exercises: Standing   Forward Step Up Right;Left;1 set;5 reps;Hand Hold: 2;Step Height: 4"    Forward Step Up Limitations demo & verbal cues on technique  Step Down Right;Left;1 set;5 reps;Hand Hold: 2;Step Height: 4"    Step Down Limitations demo & verbal cues on technique    Rocker Board 1 minute   ant/level/post and right/level/left   Rocker Board Limitations square board, BUE support, demo & verbal cues on technique.      Knee/Hip Exercises: Seated   Sit to Sand 2 sets;5 reps;without UE support   from 24" bar stool with goal to not touch //bars to stabilize     Prosthetics   Prosthetic Care Comments  Continue wear to all awake hours tomorrow drying limb/liner prn if sweating or at least every 5 hrs.    Current prosthetic wear tolerance (days/week)  daily    Current prosthetic wear tolerance (#hours/day)  all awake hours    Current prosthetic weight-bearing tolerance (hours/day)  Pt tolerated standing for 5 min with partial weight on prosthesis with no anterior tibial pain    Edema pitting    Residual limb condition  no open areas, frial skin, no hair growth, normal color & temperature    Education Provided Proper wear schedule/adjustment;Other (comment);Residual limb care   see prosthetic care comments   Person(s) Educated Patient    Education Method  Explanation;Verbal cues    Education Method Verbalized understanding;Verbal cues required;Needs further instruction               Balance Exercises - 11/03/20 1100      Balance Exercises: Standing   Standing Eyes Opened Wide (BOA);Head turns;5 reps;Solid surface   right/left, up/down & diagonal   Standing Eyes Opened Limitations MinA / tactile, verbal & visual cues on balance reactions    Standing Eyes Closed Wide (BOA);Solid surface;5 reps   right/left, up/down & diagonal   Standing Eyes Closed Limitations MinA / tactile, verbal & visual cues on balance reactions               PT Short Term Goals - 10/19/20 1252      PT SHORT TERM GOAL #1   Title Patient donnes prosthesis modified independent & verbalizes proper cleaning.    Time 4    Period Weeks    Status On-going    Target Date 11/11/20      PT SHORT TERM GOAL #2   Title Patient tolerates prosthesis >10 hrs total /day without skin issues or limb pain <5/10 after standing.    Time 4    Period Weeks    Status On-going    Target Date 11/11/20      PT SHORT TERM GOAL #3   Title Patient able to pick up items from floor & reach 10" with RW support safely.    Time 4    Period Weeks    Status On-going    Target Date 11/11/20      PT SHORT TERM GOAL #4   Title Patient ambulates 100' with RW & prosthesis with supervision.    Time 4    Period Weeks    Status On-going    Target Date 11/11/20      PT SHORT TERM GOAL #5   Title Patient negotiates ramps & curbs with RW & prosthesis with modA.    Time 4    Period Weeks    Status On-going    Target Date 11/11/20             PT Long Term Goals - 10/19/20 1254      PT LONG TERM GOAL #1   Title Patient demonstrates & verbalized understanding  of prosthetic care to enable safe utilization of prosthesis.    Time 12    Period Weeks    Status New    Target Date 01/06/21      PT LONG TERM GOAL #2   Title Patient tolerates prosthesis wear >90% of awake hours  without skin or limb pain issues.    Time 12    Period Weeks    Status On-going    Target Date 01/06/21      PT LONG TERM GOAL #3   Title Berg Balance >/= 36/56 to indicate lower fall risk    Time 12    Period Weeks    Status On-going    Target Date 01/06/21      PT LONG TERM GOAL #4   Title Patient ambulates 300' with LRAD & prosthesis modified independent.    Time 12    Period Weeks    Status On-going    Target Date 01/06/21      PT LONG TERM GOAL #5   Title Patient negotiates ramps, curbs & stairs with LRAD & prosthesis modified independent.    Time 12    Period Weeks    Status On-going    Target Date 01/06/21                 Plan - 11/03/20 1100    Clinical Impression Statement Patient is on target to  meet STGs next week.   PT session is working on endurance for activity tolerance, balance reactions & prosthetic gait which are improving slowly.    Personal Factors and Comorbidities Age;Comorbidity 3+;Fitness;Time since onset of injury/illness/exacerbation;Transportation    Comorbidities right TTA, anxiety, CHF, IDDM, HTN, PVD, LE muscle cramps    Examination-Activity Limitations Lift;Locomotion Level;Squat;Stairs;Stand;Transfers    Examination-Participation Restrictions Community Activity;Meal Prep    Stability/Clinical Decision Making Evolving/Moderate complexity    Rehab Potential Good    PT Frequency 2x / week    PT Duration 12 weeks    PT Treatment/Interventions ADLs/Self Care Home Management;DME Instruction;Gait training;Stair training;Functional mobility training;Therapeutic activities;Therapeutic exercise;Neuromuscular re-education;Patient/family education;Prosthetic Training;Scar mobilization;Passive range of motion    PT Next Visit Plan review prosthetic care, balance activities,  prosthetic gait with RW or rollator including ramps & curbs    Consulted and Agree with Plan of Care Patient           Patient will benefit from skilled therapeutic  intervention in order to improve the following deficits and impairments:  Abnormal gait,Decreased activity tolerance,Decreased balance,Decreased endurance,Decreased knowledge of use of DME,Decreased mobility,Decreased skin integrity,Decreased strength,Increased edema,Impaired flexibility,Postural dysfunction,Prosthetic Dependency,Pain  Visit Diagnosis: Other abnormalities of gait and mobility  Unsteadiness on feet  Muscle weakness (generalized)  Abnormal posture  History of falling     Problem List Patient Active Problem List   Diagnosis Date Noted  . Diabetic infection of right foot ([place]) 06/19/2020  . Chronic osteomyelitis of left foot ([place]) 06/19/2020  . HCAP (healthcare-associated pneumonia)   . Elevated troponin level not due myocardial infarction 03/13/2020  . GERD without esophagitis 03/13/2020  . Anemia 03/13/2020  . Thyroid goiter 03/13/2020  . Acute respiratory failure with hypoxia ([place]) 03/13/2020  . Type 2 diabetes mellitus with complication, without long-term current use of insulin ([place]) 03/13/2020  . Elevated liver enzymes 03/13/2020  . Acute on chronic congestive heart failure ([place]) 03/13/2020  . Acute cardiogenic pulmonary edema (HCC) 03/12/2020  . Diabetic ulcer of right foot ([place]) 03/12/2020  . Metatarsalgia of left foot 03/14/2019  . Amputated toe ([place])  12/06/2018  . Achilles tendon contracture, left 07/24/2016  . Ulcer of left foot, limited to breakdown of skin ([place]) 06/01/2016  . Cellulitis 10/27/2015  . Severe peripheral arterial disease ([place]) 12/28/2014  . Chronic diastolic heart failure ([place]) 12/30/2013  . Edema 12/30/2013  . Mixed hyperlipidemia 06/24/2013  . Mitral valve disorders(424.0) 06/24/2013  . Coronary artery disease involving native coronary artery of native heart   . Essential hypertension   . Diabetes mellitus with peripheral vascular disease ([place])   . Other primary cardiomyopathies     Jamey Reas, PT, DPT 11/03/2020, 12:53  PM  [facility] [address] [place], [place], [address] Phone: [phone]   Fax:  [phone]  Name: TALI CLEAVES MRN: [id number] Date of Birth: March 06, 1932

## 2020-11-08 ENCOUNTER — Ambulatory Visit: Payer: HMO | Admitting: Physical Therapy

## 2020-11-08 ENCOUNTER — Encounter: Payer: Self-pay | Admitting: Physical Therapy

## 2020-11-08 ENCOUNTER — Other Ambulatory Visit: Payer: Self-pay

## 2020-11-08 DIAGNOSIS — M79661 Pain in right lower leg: Secondary | ICD-10-CM

## 2020-11-08 DIAGNOSIS — R2681 Unsteadiness on feet: Secondary | ICD-10-CM | POA: Diagnosis not present

## 2020-11-08 DIAGNOSIS — M6281 Muscle weakness (generalized): Secondary | ICD-10-CM

## 2020-11-08 DIAGNOSIS — R293 Abnormal posture: Secondary | ICD-10-CM

## 2020-11-08 DIAGNOSIS — R2689 Other abnormalities of gait and mobility: Secondary | ICD-10-CM | POA: Diagnosis not present

## 2020-11-08 DIAGNOSIS — Z9181 History of falling: Secondary | ICD-10-CM | POA: Diagnosis not present

## 2020-11-08 NOTE — Therapy (Signed)
[facility] Physical Therapy [address] [place], [place], [address] Phone: [phone]   Fax:  [phone]  Physical Therapy Treatment  Patient Details  Name: [name] MRN: [id number] Date of Birth: Sep 02, 1931 Referring Provider (PT): Marya Amsler [place], [place]   Encounter Date: 11/08/2020   PT End of Session - 11/08/20 1112    Visit Number 8    Number of Visits 25    Date for PT Re-Evaluation 01/06/21    Authorization Type [employer]    Authorization Time Period $20 copay    PT Start Time 1100    PT Stop Time 1145    PT Time Calculation (min) 45 min    Equipment Utilized During Treatment Gait belt    Activity Tolerance Patient tolerated treatment well;Patient limited by pain    Behavior During Therapy [facility] for tasks assessed/performed           Past Medical History:  Diagnosis Date  . Anxiety   . Cellulitis 10/2015   LEFT FOOT  . CHF (congestive heart failure) ([place])   . Complication of anesthesia   . Coronary artery disease   . Diabetes mellitus without complication (HCC)    insulin dependent  . GERD (gastroesophageal reflux disease)   . Hypertension   . Hypothyroidism   . Neuromuscular disorder (HCC)    muscle cramps to lower extremities  . Other primary cardiomyopathies   . Peripheral vascular disease ([place])   . PONV (postoperative nausea and vomiting)   . Shortness of breath     Past Surgical History:  Procedure Laterality Date  . ABDOMINAL AORTOGRAM N/A 09/03/2018   Procedure: ABDOMINAL AORTOGRAM;  Surgeon: Serafina Mitchell, MD;  Location: [place] [facility];  Service: Cardiovascular;  Laterality: N/A;  . ABDOMINAL AORTOGRAM W/LOWER EXTREMITY N/A 04/16/2018   Procedure: ABDOMINAL AORTOGRAM W/LOWER EXTREMITY;  Surgeon: Serafina Mitchell, MD;  Location: [place] [facility];  Service: Cardiovascular;  Laterality: N/A;  unilateral  . ABDOMINAL AORTOGRAM W/LOWER EXTREMITY Bilateral 04/08/2020   Procedure: ABDOMINAL AORTOGRAM  W/LOWER EXTREMITY;  Surgeon: Serafina Mitchell, MD;  Location: [place] [facility];  Service: Cardiovascular;  Laterality: Bilateral;  . ABDOMINAL HYSTERECTOMY    . AMPUTATION Left 12/03/2018   Procedure: Left 3rd ray amputation;  Surgeon: Wylene Simmer, MD;  Location: [place];  Service: Orthopedics;  Laterality: Left;  91mn  . AMPUTATION Right 06/22/2020   Procedure: AMPUTATION BELOW KNEE;  Surgeon: HWylene Simmer MD;  [place]  Service: Orthopedics;  Laterality: Right;  . CHOLECYSTECTOMY    . CORONARY ANGIOPLASTY WITH STENT PLACEMENT  08/09/2011   DES  to mid circumflex  . I & D EXTREMITY Left 10/29/2015   Procedure: Irrigation and Debridement Left Foot;  Surgeon: MNewt Minion MD;  [place] [facility]  Service: Orthopedics;  Laterality: Left;  . LEFT HEART CATHETERIZATION WITH CORONARY ANGIOGRAM N/A 08/08/2012   Procedure: LEFT HEART CATHETERIZATION WITH CORONARY ANGIOGRAM;  Surgeon: JJettie Booze MD;  [facility];  Service: Cardiovascular;  Laterality: N/A;  . PERIPHERAL VASCULAR ATHERECTOMY  04/16/2018   Procedure: PERIPHERAL VASCULAR ATHERECTOMY;  Surgeon: BSerafina Mitchell MD;  [place] [facility];  Service: Cardiovascular;;  lt. Peroneal  . PERIPHERAL VASCULAR ATHERECTOMY Right 04/08/2020   Procedure: PERIPHERAL VASCULAR ATHERECTOMY;  Surgeon: BSerafina Mitchell MD;  [facility];  Service: Cardiovascular;  Laterality: Right;  SFA and Peroneal  . PERIPHERAL VASCULAR BALLOON ANGIOPLASTY  04/16/2018   Procedure:  PERIPHERAL VASCULAR BALLOON ANGIOPLASTY;  Surgeon: Serafina Mitchell, MD;  Location: [place] [facility];  Service: Cardiovascular;;  lt. sfa and AT  . PERIPHERAL VASCULAR BALLOON ANGIOPLASTY Left 09/03/2018   Procedure: PERIPHERAL VASCULAR BALLOON ANGIOPLASTY;  Surgeon: Serafina Mitchell, MD;  Location: [place] [facility];  Service: Cardiovascular;  Laterality: Left;  TP TRUNK  . PERIPHERAL VASCULAR BALLOON ANGIOPLASTY Right  04/08/2020   Procedure: PERIPHERAL VASCULAR BALLOON ANGIOPLASTY;  Surgeon: Serafina Mitchell, MD;  Location: [place] [facility];  Service: Cardiovascular;  Laterality: Right;  SFA (DCB), Peroneal  . PERIPHERAL VASCULAR CATHETERIZATION N/A 12/30/2014   Procedure: Lower Extremity Angiography;  Surgeon: [place] Hampshire, MD;  Location: [place] [facility];  Service: Cardiovascular;  Laterality: N/A;  . PERIPHERAL VASCULAR INTERVENTION Left 09/03/2018   Procedure: PERIPHERAL VASCULAR INTERVENTION;  Surgeon: Serafina Mitchell, MD;  Location: [place] [facility];  Service: Cardiovascular;  Laterality: Left;  SFA STENT   . TENDON RELEASE Right 03/11/2020   Procedure: Heel Cord Lengthening;  Surgeon: Wylene Simmer, MD;  Location: [place];  Service: Orthopedics;  Laterality: Right;  . THYROID SURGERY     radioactive iodine   . TONSILLECTOMY    . TRANSMETATARSAL AMPUTATION Right 03/11/2020   Procedure: Right foot transmetatarsal amputation;  Surgeon: Wylene Simmer, MD;  Location: [place];  Service: Orthopedics;  Laterality: Right;    There were no vitals filed for this visit.   Subjective Assessment - 11/08/20 1100    Subjective She is wearing prosthesis most of awake hours (7am - 7pm) The shin bone still hurts. She saw prosthetist who made changes to alignment & socket.    Patient is accompained by: Family member   son, Tereasa Yilmaz   Pertinent History right TTA, anxiety, CHF, IDDM, HTN, PVD, LE muscle cramps,    Patient Stated Goals to use prosthesis in community & apt complex    Currently in Pain? Yes    Pain Score 4     Pain Location Leg   residual limb   Pain Orientation Right;Anterior    Pain Descriptors / Indicators Aching;Sore    Pain Type Other (Comment)   prosthetic   Pain Onset 1 to 4 weeks ago    Pain Frequency Intermittent    Aggravating Factors  pressure from prosthesis    Pain Relieving Factors removing prosthesis                              [facility] Adult PT Treatment/Exercise - 11/08/20 1100      Transfers   Transfers Sit to Stand;Stand to Sit    Sit to Stand 5: Supervision;With upper extremity assist;With armrests;From chair/3-in-1   goal to not touch RW to stabilize   Sit to Stand Details Visual cues/gestures for sequencing;Verbal cues for technique    Stand to Sit 5: Supervision;With upper extremity assist;With armrests;To chair/3-in-1;To elevated surface;Other (comment)   goal to  not touch RW for stability   Stand to Sit Details (indicate cue type and reason) Verbal cues for technique;Visual cues/gestures for sequencing      Ambulation/Gait   Ambulation/Gait Yes    Ambulation/Gait Assistance 5: Supervision    Ambulation/Gait Assistance Details verbal cues to minimize UE support.    Ambulation Distance (Feet) 100 Feet   130' X 2   Assistive device Rolling walker;Prosthesis      Exercises   Exercises Knee/Hip  Knee/Hip Exercises: Aerobic   Nustep Seat 11 level 6 for 8 min      Knee/Hip Exercises: Standing   Forward Step Up Right;Left;1 set;5 reps;Hand Hold: 2;Step Height: 4"    Forward Step Up Limitations demo & verbal cues on technique    Step Down Right;Left;1 set;5 reps;Hand Hold: 2;Step Height: 4"    Step Down Limitations demo & verbal cues on technique    Rocker Board 1 minute   ant/level/post and right/level/left   Rocker Board Limitations square board, BUE support, demo & verbal cues on technique.      Knee/Hip Exercises: Seated   Sit to Sand 2 sets;5 reps;without UE support   from 24" bar stool with goal to not touch //bars to stabilize     Prosthetics   Prosthetic Care Comments  PT demo & verbal cues on donning with pin slightly tipped posteriorly to displace distal soft tissue to distal tibia.    Current prosthetic wear tolerance (days/week)  daily    Current prosthetic wear tolerance (#hours/day)  all awake hours    Current prosthetic weight-bearing tolerance  (hours/day)  Pt tolerated standing for 5 min with partial weight on prosthesis with no anterior tibial pain    Edema pitting    Residual limb condition  no open areas, frial skin, no hair growth, normal color & temperature    Education Provided Proper wear schedule/adjustment;Other (comment);Residual limb care;Proper Donning   see prosthetic care comments   Person(s) Educated Patient    Education Method Explanation;Verbal cues;Demonstration;Tactile cues    Education Method Verbalized understanding;Tactile cues required;Verbal cues required;Needs further instruction    Donning Prosthesis Supervision               Balance Exercises - 11/08/20 1100      Balance Exercises: Standing   Standing Eyes Opened Wide (BOA);Head turns;5 reps;Solid surface   right/left, up/down & diagonal   Standing Eyes Opened Limitations MinA / tactile, verbal & visual cues on balance reactions    Standing Eyes Closed Wide (BOA);Solid surface;5 reps   right/left, up/down & diagonal   Standing Eyes Closed Limitations MinA / tactile, verbal & visual cues on balance reactions               PT Short Term Goals - 11/08/20 1645      PT SHORT TERM GOAL #1   Title Patient donnes prosthesis modified independent & verbalizes proper cleaning.    Time 4    Period Weeks    Status Achieved    Target Date 11/11/20      PT SHORT TERM GOAL #2   Title Patient tolerates prosthesis >10 hrs total /day without skin issues or limb pain <5/10 after standing.    Time 4    Period Weeks    Status Achieved    Target Date 11/11/20      PT SHORT TERM GOAL #3   Title Patient able to pick up items from floor & reach 10" with RW support safely.    Time 4    Period Weeks    Status Achieved    Target Date 11/11/20      PT SHORT TERM GOAL #4   Title Patient ambulates 100' with RW & prosthesis with supervision.    Time 4    Period Weeks    Status Achieved    Target Date 11/11/20      PT SHORT TERM GOAL #5   Title  Patient negotiates ramps & curbs with RW &  prosthesis with modA.    Time 4    Period Weeks    Status Achieved    Target Date 11/11/20             PT Long Term Goals - 10/19/20 1254      PT LONG TERM GOAL #1   Title Patient demonstrates & verbalized understanding of prosthetic care to enable safe utilization of prosthesis.    Time 12    Period Weeks    Status New    Target Date 01/06/21      PT LONG TERM GOAL #2   Title Patient tolerates prosthesis wear >90% of awake hours without skin or limb pain issues.    Time 12    Period Weeks    Status On-going    Target Date 01/06/21      PT LONG TERM GOAL #3   Title Berg Balance >/= 36/56 to indicate lower fall risk    Time 12    Period Weeks    Status On-going    Target Date 01/06/21      PT LONG TERM GOAL #4   Title Patient ambulates 300' with LRAD & prosthesis modified independent.    Time 12    Period Weeks    Status On-going    Target Date 01/06/21      PT LONG TERM GOAL #5   Title Patient negotiates ramps, curbs & stairs with LRAD & prosthesis modified independent.    Time 12    Period Weeks    Status On-going    Target Date 01/06/21                 Plan - 11/08/20 1112    Clinical Impression Statement Patient met all STGs set for first 30 days.  She is improving her function with prosthesis with RW support and progressing with balance reactions.    Personal Factors and Comorbidities Age;Comorbidity 3+;Fitness;Time since onset of injury/illness/exacerbation;Transportation    Comorbidities right TTA, anxiety, CHF, IDDM, HTN, PVD, LE muscle cramps    Examination-Activity Limitations Lift;Locomotion Level;Squat;Stairs;Stand;Transfers    Examination-Participation Restrictions Community Activity;Meal Prep    Stability/Clinical Decision Making Evolving/Moderate complexity    Rehab Potential Good    PT Frequency 2x / week    PT Duration 12 weeks    PT Treatment/Interventions ADLs/Self Care Home Management;DME  Instruction;Gait training;Stair training;Functional mobility training;Therapeutic activities;Therapeutic exercise;Neuromuscular re-education;Patient/family education;Prosthetic Training;Scar mobilization;Passive range of motion    PT Next Visit Plan set updated STGs, review prosthetic care, balance activities,  prosthetic gait with RW or rollator including ramps & curbs    Consulted and Agree with Plan of Care Patient           Patient will benefit from skilled therapeutic intervention in order to improve the following deficits and impairments:  Abnormal gait,Decreased activity tolerance,Decreased balance,Decreased endurance,Decreased knowledge of use of DME,Decreased mobility,Decreased skin integrity,Decreased strength,Increased edema,Impaired flexibility,Postural dysfunction,Prosthetic Dependency,Pain  Visit Diagnosis: Other abnormalities of gait and mobility  Unsteadiness on feet  Muscle weakness (generalized)  Abnormal posture  Pain in right lower leg  History of falling     Problem List Patient Active Problem List   Diagnosis Date Noted  . Diabetic infection of right foot ([place]) 06/19/2020  . Chronic osteomyelitis of left foot ([place]) 06/19/2020  . HCAP (healthcare-associated pneumonia)   . Elevated troponin level not due myocardial infarction 03/13/2020  . GERD without esophagitis 03/13/2020  . Anemia 03/13/2020  . Thyroid goiter 03/13/2020  . Acute respiratory failure with  hypoxia ([place]) 03/13/2020  . Type 2 diabetes mellitus with complication, without long-term current use of insulin ([place]) 03/13/2020  . Elevated liver enzymes 03/13/2020  . Acute on chronic congestive heart failure ([place]) 03/13/2020  . Acute cardiogenic pulmonary edema (HCC) 03/12/2020  . Diabetic ulcer of right foot ([place]) 03/12/2020  . Metatarsalgia of left foot 03/14/2019  . Amputated toe ([place]) 12/06/2018  . Achilles tendon contracture, left 07/24/2016  . Ulcer of left foot, limited to breakdown of  skin ([place]) 06/01/2016  . Cellulitis 10/27/2015  . Severe peripheral arterial disease ([place]) 12/28/2014  . Chronic diastolic heart failure ([place]) 12/30/2013  . Edema 12/30/2013  . Mixed hyperlipidemia 06/24/2013  . Mitral valve disorders(424.0) 06/24/2013  . Coronary artery disease involving native coronary artery of native heart   . Essential hypertension   . Diabetes mellitus with peripheral vascular disease ([place])   . Other primary cardiomyopathies     Jamey Reas, PT, DPT 11/08/2020, 4:47 PM  [facility] Physical Therapy [address] [place], [place], [address] Phone: [phone]   Fax:  [phone]  Name: [name] MRN: [id number] Date of Birth: [birth year]

## 2020-11-10 ENCOUNTER — Encounter: Payer: HMO | Admitting: Physical Therapy

## 2020-11-15 ENCOUNTER — Other Ambulatory Visit: Payer: Self-pay

## 2020-11-15 ENCOUNTER — Ambulatory Visit: Payer: HMO | Admitting: Physical Therapy

## 2020-11-15 DIAGNOSIS — M79661 Pain in right lower leg: Secondary | ICD-10-CM

## 2020-11-15 DIAGNOSIS — Z9181 History of falling: Secondary | ICD-10-CM | POA: Diagnosis not present

## 2020-11-15 DIAGNOSIS — M6281 Muscle weakness (generalized): Secondary | ICD-10-CM | POA: Diagnosis not present

## 2020-11-15 DIAGNOSIS — R2689 Other abnormalities of gait and mobility: Secondary | ICD-10-CM

## 2020-11-15 DIAGNOSIS — R293 Abnormal posture: Secondary | ICD-10-CM

## 2020-11-15 DIAGNOSIS — R2681 Unsteadiness on feet: Secondary | ICD-10-CM | POA: Diagnosis not present

## 2020-11-15 NOTE — Therapy (Signed)
[facility] [address] [place], [place], [address] Phone: [phone]   Fax:  [phone]  Physical Therapy Treatment  Patient Details  Name: [name] MRN: [id number] Date of Birth: [birth year] Referring Provider (PT): Marya Amsler [place], [place]   Encounter Date: 11/15/2020   PT End of Session - 11/15/20 1143    Visit Number 9    Number of Visits 25    Date for PT Re-Evaluation 01/06/21    Authorization Type [employer]    Authorization Time Period $20 copay    PT Start Time 1100    PT Stop Time 1140    PT Time Calculation (min) 40 min    Equipment Utilized During Treatment Gait belt    Activity Tolerance Patient tolerated treatment well;Patient limited by pain    Behavior During Therapy [facility] for tasks assessed/performed           Past Medical History:  Diagnosis Date  . Anxiety   . Cellulitis 10/2015   LEFT FOOT  . CHF (congestive heart failure) ([place])   . Complication of anesthesia   . Coronary artery disease   . Diabetes mellitus without complication (HCC)    insulin dependent  . GERD (gastroesophageal reflux disease)   . Hypertension   . Hypothyroidism   . Neuromuscular disorder (HCC)    muscle cramps to lower extremities  . Other primary cardiomyopathies   . Peripheral vascular disease ([place])   . PONV (postoperative nausea and vomiting)   . Shortness of breath     Past Surgical History:  Procedure Laterality Date  . ABDOMINAL AORTOGRAM N/A 09/03/2018   Procedure: ABDOMINAL AORTOGRAM;  Surgeon: Serafina Mitchell, MD;  Location: [place] [facility];  Service: Cardiovascular;  Laterality: N/A;  . ABDOMINAL AORTOGRAM W/LOWER EXTREMITY N/A 04/16/2018   Procedure: ABDOMINAL AORTOGRAM W/LOWER EXTREMITY;  Surgeon: Serafina Mitchell, MD;  Location: [place] [facility];  Service: Cardiovascular;  Laterality: N/A;  unilateral  . ABDOMINAL AORTOGRAM W/LOWER EXTREMITY Bilateral 04/08/2020   Procedure: ABDOMINAL AORTOGRAM W/LOWER  EXTREMITY;  Surgeon: Serafina Mitchell, MD;  Location: [place] [facility];  Service: Cardiovascular;  Laterality: Bilateral;  . ABDOMINAL HYSTERECTOMY    . AMPUTATION Left 12/03/2018   Procedure: Left 3rd ray amputation;  Surgeon: Wylene Simmer, MD;  Location: [place];  Service: Orthopedics;  Laterality: Left;  47min  . AMPUTATION Right 06/22/2020   Procedure: AMPUTATION BELOW KNEE;  Surgeon: Wylene Simmer, MD;  Location: [place];  Service: Orthopedics;  Laterality: Right;  . CHOLECYSTECTOMY    . CORONARY ANGIOPLASTY WITH STENT PLACEMENT  08/09/2011   DES  to mid circumflex  . I & D EXTREMITY Left 10/29/2015   Procedure: Irrigation and Debridement Left Foot;  Surgeon: Newt Minion, MD;  Location: [place];  Service: Orthopedics;  Laterality: Left;  . LEFT HEART CATHETERIZATION WITH CORONARY ANGIOGRAM N/A 08/08/2012   Procedure: LEFT HEART CATHETERIZATION WITH CORONARY ANGIOGRAM;  Surgeon: [name], MD;  Location: [facility] C[facility];  Service: Cardiovascular;  Laterality: N/A;  . PERIPHERAL VASCULAR ATHERECTOMY  04/16/2018   Procedure: PERIPHERAL VASCULAR ATHERECTOMY;  Surgeon: Serafina Mitchell, MD;  Location: [place] [facility];  Service: Cardiovascular;;  lt. Peroneal  . PERIPHERAL VASCULAR ATHERECTOMY Right 04/08/2020   Procedure: PERIPHERAL VASCULAR ATHERECTOMY;  Surgeon: Serafina Mitchell, MD;  Location: [place] [facility];  Service: Cardiovascular;  Laterality: Right;  SFA and Peroneal  . PERIPHERAL VASCULAR BALLOON ANGIOPLASTY  04/16/2018   Procedure:  PERIPHERAL VASCULAR BALLOON ANGIOPLASTY;  Surgeon: Serafina Mitchell, MD;  Location: [place] [facility];  Service: Cardiovascular;;  lt. sfa and AT  . PERIPHERAL VASCULAR BALLOON ANGIOPLASTY Left 09/03/2018   Procedure: PERIPHERAL VASCULAR BALLOON ANGIOPLASTY;  Surgeon: Serafina Mitchell, MD;  Location: [place] [facility];  Service: Cardiovascular;  Laterality: Left;  TP TRUNK  . PERIPHERAL VASCULAR BALLOON ANGIOPLASTY Right 04/08/2020    Procedure: PERIPHERAL VASCULAR BALLOON ANGIOPLASTY;  Surgeon: Serafina Mitchell, MD;  Location: [place] [facility];  Service: Cardiovascular;  Laterality: Right;  SFA (DCB), Peroneal  . PERIPHERAL VASCULAR CATHETERIZATION N/A 12/30/2014   Procedure: Lower Extremity Angiography;  Surgeon: Wellington Hampshire, MD;  Location: [place] [facility];  Service: Cardiovascular;  Laterality: N/A;  . PERIPHERAL VASCULAR INTERVENTION Left 09/03/2018   Procedure: PERIPHERAL VASCULAR INTERVENTION;  Surgeon: Serafina Mitchell, MD;  Location: [place] [facility];  Service: Cardiovascular;  Laterality: Left;  SFA STENT   . TENDON RELEASE Right 03/11/2020   Procedure: Heel Cord Lengthening;  Surgeon: Wylene Simmer, MD;  Location: [place];  Service: Orthopedics;  Laterality: Right;  . THYROID SURGERY     radioactive iodine   . TONSILLECTOMY    . TRANSMETATARSAL AMPUTATION Right 03/11/2020   Procedure: Right foot transmetatarsal amputation;  Surgeon: Wylene Simmer, MD;  Location: [place];  Service: Orthopedics;  Laterality: Right;    There were no vitals filed for this visit.   Subjective Assessment - 11/15/20 1136    Subjective She relays some improvement in tibial pain since she had alignment change but sitll some pain that gets better once she is walking.    Patient is accompained by: Family member   son, [name]   Pertinent History right TTA, anxiety, CHF, IDDM, HTN, PVD, LE muscle cramps,    Patient Stated Goals to use prosthesis in community & apt complex    Pain Onset 1 to 4 weeks ago            [facility] Adult PT Treatment/Exercise - 11/15/20 0001      Transfers   Transfers Sit to Stand;Stand to Sit    Sit to Stand 5: Supervision;With upper extremity assist;With armrests;From chair/3-in-1   goal to not touch RW to stabilize   Sit to Stand Details Visual cues/gestures for sequencing;Verbal cues for technique    Stand to Sit 5: Supervision;With upper extremity assist;With  armrests;To chair/3-in-1;To elevated surface;Other (comment)   goal to  not touch RW for stability   Stand to Sit Details (indicate cue type and reason) Verbal cues for technique;Visual cues/gestures for sequencing      Ambulation/Gait   Ambulation/Gait Yes    Ambulation/Gait Assistance 5: Supervision    Ambulation Distance (Feet) 100 Feet   130' X 2   Assistive device Rolling walker;Prosthesis      High Level Balance   High Level Balance Comments in //bars with BUE support progressing to one UE support for balance NBOS with head turns, head nods, diagonal head turns, and eyes closed      Exercises   Exercises Knee/Hip      Knee/Hip Exercises: Aerobic   Nustep Seat 10 level 6 for 10 min (some rest breaks needed)      Knee/Hip Exercises: Standing   Forward Step Up Right;Left;1 set;5 reps;Hand Hold: 2;Step Height: 4"    Forward Step Up Limitations demo & verbal cues on technique    Step Down Right;Left;1 set;5 reps;Hand Hold: 2;Step Height: 4"  Step Down Limitations demo & verbal cues on technique    Rocker Board 1 minute   ant/level/post and right/level/left   Rocker Board Limitations square board, BUE support, demo & verbal cues on technique.      Knee/Hip Exercises: Seated   Sit to Sand 2 sets;5 reps;without UE support   from 24" bar stool with goal to not touch //bars to stabilize     Prosthetics   Prosthetic Care Comments  PT demo & verbal cues on removing prosthesis and liner to wipe dry after sweating    Current prosthetic wear tolerance (days/week)  daily    Current prosthetic wear tolerance (#hours/day)  all awake hours    Edema pitting    Residual limb condition  no open areas, frial skin, no hair growth, normal color & temperature    Education Provided Proper wear schedule/adjustment;Other (comment);Residual limb care;Proper Donning   see prosthetic care comments                   PT Short Term Goals - 11/08/20 1645      PT SHORT TERM GOAL #1   Title  Patient donnes prosthesis modified independent & verbalizes proper cleaning.    Time 4    Period Weeks    Status Achieved    Target Date 11/11/20      PT SHORT TERM GOAL #2   Title Patient tolerates prosthesis >10 hrs total /day without skin issues or limb pain <5/10 after standing.    Time 4    Period Weeks    Status Achieved    Target Date 11/11/20      PT SHORT TERM GOAL #3   Title Patient able to pick up items from floor & reach 10" with RW support safely.    Time 4    Period Weeks    Status Achieved    Target Date 11/11/20      PT SHORT TERM GOAL #4   Title Patient ambulates 100' with RW & prosthesis with supervision.    Time 4    Period Weeks    Status Achieved    Target Date 11/11/20      PT SHORT TERM GOAL #5   Title Patient negotiates ramps & curbs with RW & prosthesis with modA.    Time 4    Period Weeks    Status Achieved    Target Date 11/11/20             PT Long Term Goals - 10/19/20 1254      PT LONG TERM GOAL #1   Title Patient demonstrates & verbalized understanding of prosthetic care to enable safe utilization of prosthesis.    Time 12    Period Weeks    Status New    Target Date 01/06/21      PT LONG TERM GOAL #2   Title Patient tolerates prosthesis wear >90% of awake hours without skin or limb pain issues.    Time 12    Period Weeks    Status On-going    Target Date 01/06/21      PT LONG TERM GOAL #3   Title Berg Balance >/= 36/56 to indicate lower fall risk    Time 12    Period Weeks    Status On-going    Target Date 01/06/21      PT LONG TERM GOAL #4   Title Patient ambulates 300' with LRAD & prosthesis modified independent.    Time 12  Period Weeks    Status On-going    Target Date 01/06/21      PT LONG TERM GOAL #5   Title Patient negotiates ramps, curbs & stairs with LRAD & prosthesis modified independent.    Time 12    Period Weeks    Status On-going    Target Date 01/06/21                 Plan - 11/15/20  1143    Clinical Impression Statement continued to work to improve overall endurance, gait, leg strength, and balance. She had good tolerance to exercises and activiites but is still limited by fatigue. We will work to continue to progress these deficits as able.    Personal Factors and Comorbidities Age;Comorbidity 3+;Fitness;Time since onset of injury/illness/exacerbation;Transportation    Comorbidities right TTA, anxiety, CHF, IDDM, HTN, PVD, LE muscle cramps    Examination-Activity Limitations Lift;Locomotion Level;Squat;Stairs;Stand;Transfers    Examination-Participation Restrictions Community Activity;Meal Prep    Stability/Clinical Decision Making Evolving/Moderate complexity    Rehab Potential Good    PT Frequency 2x / week    PT Duration 12 weeks    PT Treatment/Interventions ADLs/Self Care Home Management;DME Instruction;Gait training;Stair training;Functional mobility training;Therapeutic activities;Therapeutic exercise;Neuromuscular re-education;Patient/family education;Prosthetic Training;Scar mobilization;Passive range of motion    PT Next Visit Plan set updated STGs, review prosthetic care, balance activities,  prosthetic gait with RW or rollator including ramps & curbs    Consulted and Agree with Plan of Care Patient           Patient will benefit from skilled therapeutic intervention in order to improve the following deficits and impairments:  Abnormal gait,Decreased activity tolerance,Decreased balance,Decreased endurance,Decreased knowledge of use of DME,Decreased mobility,Decreased skin integrity,Decreased strength,Increased edema,Impaired flexibility,Postural dysfunction,Prosthetic Dependency,Pain  Visit Diagnosis: Other abnormalities of gait and mobility  Unsteadiness on feet  Muscle weakness (generalized)  Abnormal posture  Pain in right lower leg  History of falling     Problem List Patient Active Problem List   Diagnosis Date Noted  . Diabetic infection  of right foot (HCC) 06/19/2020  . Chronic osteomyelitis of left foot (HCC) 06/19/2020  . HCAP (healthcare-associated pneumonia)   . Elevated troponin level not due myocardial infarction 03/13/2020  . GERD without esophagitis 03/13/2020  . Anemia 03/13/2020  . Thyroid goiter 03/13/2020  . Acute respiratory failure with hypoxia (HCC) 03/13/2020  . Type 2 diabetes mellitus with complication, without long-term current use of insulin (HCC) 03/13/2020  . Elevated liver enzymes 03/13/2020  . Acute on chronic congestive heart failure (HCC) 03/13/2020  . Acute cardiogenic pulmonary edema (HCC) 03/12/2020  . Diabetic ulcer of right foot (HCC) 03/12/2020  . Metatarsalgia of left foot 03/14/2019  . Amputated toe (HCC) 12/06/2018  . Achilles tendon contracture, left 07/24/2016  . Ulcer of left foot, limited to breakdown of skin (HCC) 06/01/2016  . Cellulitis 10/27/2015  . Severe peripheral arterial disease (HCC) 12/28/2014  . Chronic diastolic heart failure (HCC) 12/30/2013  . Edema 12/30/2013  . Mixed hyperlipidemia 06/24/2013  . Mitral valve disorders(424.0) 06/24/2013  . Coronary artery disease involving native coronary artery of native heart   . Essential hypertension   . Diabetes mellitus with peripheral vascular disease (HCC)   . Other primary cardiomyopathies     Birdie Riddle 11/15/2020, 11:44 AM  [facility] [address] [place], [place], [address] Phone: [phone]   Fax:  [phone]  Name: [name] MRN: [id number] Date of Birth: May 28, 1932

## 2020-11-17 ENCOUNTER — Encounter: Payer: HMO | Admitting: Physical Therapy

## 2020-11-22 ENCOUNTER — Ambulatory Visit: Payer: HMO | Admitting: Physical Therapy

## 2020-11-22 ENCOUNTER — Other Ambulatory Visit: Payer: Self-pay

## 2020-11-22 ENCOUNTER — Encounter: Payer: Self-pay | Admitting: Physical Therapy

## 2020-11-22 DIAGNOSIS — R2689 Other abnormalities of gait and mobility: Secondary | ICD-10-CM

## 2020-11-22 DIAGNOSIS — M6281 Muscle weakness (generalized): Secondary | ICD-10-CM | POA: Diagnosis not present

## 2020-11-22 DIAGNOSIS — M79661 Pain in right lower leg: Secondary | ICD-10-CM | POA: Diagnosis not present

## 2020-11-22 DIAGNOSIS — R293 Abnormal posture: Secondary | ICD-10-CM

## 2020-11-22 DIAGNOSIS — R2681 Unsteadiness on feet: Secondary | ICD-10-CM | POA: Diagnosis not present

## 2020-11-22 NOTE — Progress Notes (Signed)
Cardiology Office Note   Date:  11/23/2020   ID:  [name], [name] 1932-06-22, MRN [id number]  PCP:  Ginger Organ., MD    No chief complaint on file.  CAD  Wt Readings from Last 3 Encounters:  11/23/20 169 lb 9.6 oz (76.9 kg)  06/19/20 167 lb 5.3 oz (75.9 kg)  05/10/20 165 lb (74.8 kg)       History of Present Illness: [name] is a 87 y.o. female  who had a circumflex stent placed in Jan 2014. She had a PTCA of the OM. She has a CTO of the RCA with good collaterals, managed medically.  She is sen by vascular surgery andis status post atherectomy and angioplasty of an 80% ostial left peroneal artery stenosis and drug-eluting balloon angioplasty of tandem 60-70% left superficial femoral artery lesions. This was performed on 04/16/2018 for a wound which has been treated by both Dr. Sharol Given and podiatry. This wound healed. She developed a new wound and a repeat angiogram was performed on 09-03-2018. Stented her left SFA and repeated PTA of her peroneal.  She also has a chest mass which is enlarging thyroid, multinodular. THere was tracheal deviation.   As of 2021, "Difficulty controlling her diabetes.SHe has had some low glucoses as well while trying for tighter control.  Her husband went to memory care in 2021.  She has moved to an apartment. She gets around with a walker.   Right ankle fracture. Then she had a wound developed. In 8/21, she had surgery to remove two toes from the right foot.  She had volume overload after the procedure and echocardiogram showed ejection fraction of 45 to 50%.  In 9/21, she had additional angiography and PTA of the below knee vessels to help heal the amputation site. "  She ultimately had a right BKA.   Adjusting to this slowly.   Denies : Chest pain. Dizziness. Nitroglycerin use. Orthopnea. Palpitations. Paroxysmal nocturnal dyspnea. Shortness of breath. Syncope.   Doing therapy twice a week.  She is now using a walker.   No falls recently.   Occasional leg edema.  Takes a prn Lasix every two weeks.     Past Medical History:  Diagnosis Date  . Anxiety   . Cellulitis 10/2015   LEFT FOOT  . CHF (congestive heart failure) ([place])   . Complication of anesthesia   . Coronary artery disease   . Diabetes mellitus without complication (HCC)    insulin dependent  . GERD (gastroesophageal reflux disease)   . Hypertension   . Hypothyroidism   . Neuromuscular disorder (HCC)    muscle cramps to lower extremities  . Other primary cardiomyopathies   . Peripheral vascular disease ([place])   . PONV (postoperative nausea and vomiting)   . Shortness of breath     Past Surgical History:  Procedure Laterality Date  . ABDOMINAL AORTOGRAM N/A 09/03/2018   Procedure: ABDOMINAL AORTOGRAM;  Surgeon: Serafina Mitchell, MD;  Location: [facility];  Service: Cardiovascular;  Laterality: N/A;  . ABDOMINAL AORTOGRAM W/LOWER EXTREMITY N/A 04/16/2018   Procedure: ABDOMINAL AORTOGRAM W/LOWER EXTREMITY;  Surgeon: Serafina Mitchell, MD;  Location: [facility];  Service: Cardiovascular;  Laterality: N/A;  unilateral  . ABDOMINAL AORTOGRAM W/LOWER EXTREMITY Bilateral 04/08/2020   Procedure: ABDOMINAL AORTOGRAM W/LOWER EXTREMITY;  Surgeon: Serafina Mitchell, MD;  Location: [facility];  Service: Cardiovascular;  Laterality: Bilateral;  . ABDOMINAL HYSTERECTOMY    .  AMPUTATION Left 12/03/2018   Procedure: Left 3rd ray amputation;  Surgeon: Wylene Simmer, MD;  Location: [facility];  Service: Orthopedics;  Laterality: Left;  63min  . AMPUTATION Right 06/22/2020   Procedure: AMPUTATION BELOW KNEE;  Surgeon: Wylene Simmer, MD;  Location: [facility];  Service: Orthopedics;  Laterality: Right;  . CHOLECYSTECTOMY    . CORONARY ANGIOPLASTY WITH STENT PLACEMENT  08/09/2011   DES  to mid circumflex  . I & D EXTREMITY Left 10/29/2015   Procedure: Irrigation and Debridement Left Foot;  Surgeon: Newt Minion, MD;  Location: [facility];  Service: Orthopedics;  Laterality: Left;  . LEFT HEART CATHETERIZATION WITH CORONARY ANGIOGRAM N/A 08/08/2012   Procedure: LEFT HEART CATHETERIZATION WITH CORONARY ANGIOGRAM;  Surgeon: [name], MD;  Location: [facility];  Service: Cardiovascular;  Laterality: N/A;  . PERIPHERAL VASCULAR ATHERECTOMY  04/16/2018   Procedure: PERIPHERAL VASCULAR ATHERECTOMY;  Surgeon: Serafina Mitchell, MD;  Location: [facility];  Service: Cardiovascular;;  lt. Peroneal  . PERIPHERAL VASCULAR ATHERECTOMY Right 04/08/2020   Procedure: PERIPHERAL VASCULAR ATHERECTOMY;  Surgeon: Serafina Mitchell, MD;  Location: [facility];  Service: Cardiovascular;  Laterality: Right;  SFA and Peroneal  . PERIPHERAL VASCULAR BALLOON ANGIOPLASTY  04/16/2018   Procedure: PERIPHERAL VASCULAR BALLOON ANGIOPLASTY;  Surgeon: Serafina Mitchell, MD;  Location: [facility];  Service: Cardiovascular;;  lt. sfa and AT  . PERIPHERAL VASCULAR BALLOON ANGIOPLASTY Left 09/03/2018   Procedure: PERIPHERAL VASCULAR BALLOON ANGIOPLASTY;  Surgeon: Serafina Mitchell, MD;  Location: [facility];  Service: Cardiovascular;  Laterality: Left;  TP TRUNK  . PERIPHERAL VASCULAR BALLOON ANGIOPLASTY Right 04/08/2020   Procedure: PERIPHERAL VASCULAR BALLOON ANGIOPLASTY;  Surgeon: Serafina Mitchell, MD;  Location: [facility];  Service: Cardiovascular;  Laterality: Right;  SFA (DCB), Peroneal  . PERIPHERAL VASCULAR CATHETERIZATION N/A 12/30/2014   Procedure: Lower Extremity Angiography;  Surgeon: Wellington Hampshire, MD;  Location: [facility];  Service: Cardiovascular;  Laterality: N/A;  . PERIPHERAL VASCULAR INTERVENTION Left 09/03/2018   Procedure: PERIPHERAL VASCULAR INTERVENTION;  Surgeon: Serafina Mitchell, MD;  Location: [facility];  Service: Cardiovascular;  Laterality: Left;  SFA STENT   . TENDON RELEASE Right 03/11/2020   Procedure: Heel Cord Lengthening;  Surgeon: Wylene Simmer, MD;  Location: [facility];  Service: Orthopedics;  Laterality: Right;  . THYROID SURGERY     radioactive iodine   . TONSILLECTOMY    . TRANSMETATARSAL AMPUTATION Right 03/11/2020   Procedure: Right foot transmetatarsal amputation;  Surgeon: Wylene Simmer, MD;  Location: [facility];  Service: Orthopedics;  Laterality: Right;     Current Outpatient Medications  Medication Sig Dispense Refill  . acetaminophen (TYLENOL) 325 MG tablet Take 2 tablets (650 mg total) by mouth every 4 (four) hours as needed for headache or mild pain.    Marland Kitchen aspirin EC 81 MG EC tablet Take 1 tablet (81 mg total) by mouth daily. Swallow whole. (Patient taking differently: Take 325 mg by mouth at bedtime. Swallow whole.) 30 tablet 11  . atorvastatin (LIPITOR) 20 MG tablet Take 1 tablet (20 mg total) by mouth daily. Please keep upcoming appt in July with Dr. Irish Lack before anymore refills. Thank you (Patient taking differently: Take 20 mg by mouth every evening.) 90 tablet 0  . b complex vitamins capsule Take 1 capsule by mouth daily.    . Biotin w/ Vitamins C & E (  HAIR SKIN & NAILS GUMMIES PO) Take 2 tablets by mouth daily. gummy    . Calcium-Magnesium-Zinc (CAL-MAG-ZINC PO) Take 1 tablet by mouth daily.    . carvedilol (COREG) 25 MG tablet Take 1 tablet (25 mg total) by mouth 2 (two) times daily with a meal. *Please call and schedule an appointment with Dr Fletcher Anon* (Patient taking differently: Take 25 mg by mouth at bedtime.) 60 tablet 0  . cholecalciferol (VITAMIN D) 1000 units tablet Take 2,000 Units by mouth daily. TAKE 6 DAYS PER WEEK BY MOUTH ONCE DAILY.    Marland Kitchen clopidogrel (PLAVIX) 75 MG tablet TAKE 1 TABLET BY MOUTH ONCE DAILY (Patient taking differently: Take 75 mg by mouth at bedtime.) 30 tablet 11  . Coenzyme Q10 (CO Q 10) 100 MG CAPS Take 300 mg by mouth daily.     Marland Kitchen docusate sodium (COLACE) 100 MG capsule Take 1 capsule (100 mg total) by mouth 2 (two) times daily as needed for mild constipation. 10 capsule 0  .  ergocalciferol (VITAMIN D2) 1.25 MG (50000 UT) capsule Take 50,000 Units by mouth every Friday.     . furosemide (LASIX) 40 MG tablet Take 1 tablet (40 mg total) by mouth daily. 90 tablet 3  . insulin aspart (NOVOLOG) 100 UNIT/ML injection Inject 10 Units into the skin 3 (three) times daily with meals. 10 mL 11  . insulin glargine (LANTUS) 100 UNIT/ML injection Inject 0.35 mLs (35 Units total) into the skin every evening. (Patient taking differently: Inject 70 Units into the skin at bedtime.) 10 mL 11  . insulin lispro (HUMALOG) 100 UNIT/ML injection Inject 10-20 Units into the skin See admin instructions. Take 10 units at breakfast, 20 units at lunch and dinner    . iron polysaccharides (NIFEREX) 150 MG capsule Take 150 mg by mouth daily.     Marland Kitchen lisinopril (PRINIVIL,ZESTRIL) 20 MG tablet Take 20 mg by mouth every evening.     . nitroGLYCERIN (NITROSTAT) 0.4 MG SL tablet DISSOLVE 1 TABLET UNDER THE TONGUE EVERY 5 MINUTES AS NEEDED FOR CHEST PAIN 25 tablet 6  . pantoprazole (PROTONIX) 40 MG tablet TAKE 1 TABLET BY MOUTH ONCE DAILY AT 6 AM (Patient taking differently: Take 40 mg by mouth daily.) 90 tablet 3  . PARoxetine (PAXIL) 40 MG tablet Take 40 mg by mouth at bedtime.     . polyethylene glycol (MIRALAX / GLYCOLAX) 17 g packet Take 17 g by mouth daily as needed for mild constipation. 14 each 0  . potassium chloride SA (KLOR-CON) 20 MEQ tablet Take 1 tablet (20 mEq total) by mouth daily. 90 tablet 3  . senna (SENOKOT) 8.6 MG TABS tablet Take 1 tablet (8.6 mg total) by mouth daily as needed for mild constipation. 120 tablet 0   No current facility-administered medications for this visit.    Allergies:   Sulfa antibiotics    Social History:  The patient  reports that she quit smoking about 60 years ago. She has never used smokeless tobacco. She reports current alcohol use. She reports that she does not use drugs.   Family History:  The patient's family history includes Diabetes in her sister;  Heart attack in her father and grandchild; Heart disease in her father and son; Sudden death in her grandchild.    ROS:  Please see the history of present illness.   Otherwise, review of systems are positive for .   All other systems are reviewed and negative.    PHYSICAL EXAM: VS:  BP Marland Kitchen)  142/76   Pulse (!) 104   Ht 5\' 8"  (1.727 m)   Wt 169 lb 9.6 oz (76.9 kg)   SpO2 99%   BMI 25.79 kg/m  , BMI Body mass index is 25.79 kg/m. GEN: Well nourished, well developed, in no acute distress  HEENT: normal  Neck: no JVD, carotid bruits, or masses Cardiac: RRR; no murmurs, rubs, or gallops,no edema  Respiratory:  clear to auscultation bilaterally, normal work of breathing GI: soft, nontender, nondistended, + BS MS: Right lower extremity prosthetic Skin: warm and dry, no rash Neuro:  Strength and sensation are intact Psych: euthymic mood, full affect   EKG:   The ekg ordered today demonstrates sinus tach, RBBB   Recent Labs: 03/12/2020: B Natriuretic Peptide 1,584.9; TSH 0.892 06/18/2020: ALT 15 06/23/2020: Hemoglobin 9.6; Platelets 430 06/28/2020: BUN 22; Creatinine, Ser 0.53; Magnesium 1.9; Potassium 3.5; Sodium 140   Lipid Panel    Component Value Date/Time   CHOL 141 12/21/2014 0733   TRIG (H) 12/21/2014 0733    442.0 Triglyceride is over 400; calculations on Lipids are invalid.   HDL 29.10 (L) 12/21/2014 0733   CHOLHDL 5 12/21/2014 0733   VLDL 56.6 (H) 06/22/2014 0737   LDLCALC 24 10/20/2013 0739   LDLDIRECT 46.0 12/21/2014 0733     Other studies Reviewed: Additional studies/ records that were reviewed today with results demonstrating: Labs reviewed.  A1c 7.2..  LDL 114 in April 2022.  Triglycerides greater than 400.   ASSESSMENT AND PLAN:  1. CAD: Continue aggressive secondary prevention.  2. Chronic diastolic heart failure: Appears euvolemic.  Using Lasix as needed.  Elevate leg for edema. 3. PAD: amputation in 2021. Followed by vascular surgery. 4. Hyperlipidemia:  Continue statin along with healthy diet. Contineu atorvastatin.  Zetia was added per her report.  We will confirm that this is the medication that was added.  Continue atorvastatin 20 mg daily.  If on recheck, her lipids continue to be elevated with LDL above 100, will increase atorvastatin to 40 mg daily.  She has follow-up with Dr. Brigitte Pulse in a few months. 5. DM: Whole food, plant based diet. Avoid processed foods.  A1c 7.2   Current medicines are reviewed at length with the patient today.  The patient concerns regarding her medicines were addressed.  The following changes have been made:  No change  Labs/ tests ordered today include:  No orders of the defined types were placed in this encounter.   Recommend 150 minutes/week of aerobic exercise Low fat, low carb, high fiber diet recommended  Disposition:   FU in 1 year   Signed, Larae Grooms, MD  11/23/2020 2:05 PM    [facility] [address], [place], [place]  [address] Phone: [phone]; Fax: [phone]

## 2020-11-22 NOTE — Therapy (Addendum)
[facility] [address] [place], [place], [address] Phone: [phone]   Fax:  [phone]  Physical Therapy Treatment & 10th Visit Progress Note  Patient Details  Name: [name] MRN: [id number] Date of Birth: 05-02-1932 Referring Provider (PT): [name], [place]   Encounter Date: 11/22/2020   Progress Note Reporting Period 10/11/2020 to 11/22/2020  See note below for Objective Data and Assessment of Progress/Goals.        PT End of Session - 11/22/20 1104    Visit Number 10    Number of Visits 25    Date for PT Re-Evaluation 01/06/21    Authorization Type [employer]    Authorization Time Period $20 copay    PT Start Time 1101    PT Stop Time 1145    PT Time Calculation (min) 44 min    Equipment Utilized During Treatment Gait belt    Activity Tolerance Patient tolerated treatment well;Patient limited by pain    Behavior During Therapy WFL for tasks assessed/performed           Past Medical History:  Diagnosis Date  . Anxiety   . Cellulitis 10/2015   LEFT FOOT  . CHF (congestive heart failure) ([place])   . Complication of anesthesia   . Coronary artery disease   . Diabetes mellitus without complication (HCC)    insulin dependent  . GERD (gastroesophageal reflux disease)   . Hypertension   . Hypothyroidism   . Neuromuscular disorder (HCC)    muscle cramps to lower extremities  . Other primary cardiomyopathies   . Peripheral vascular disease ([place])   . PONV (postoperative nausea and vomiting)   . Shortness of breath     Past Surgical History:  Procedure Laterality Date  . ABDOMINAL AORTOGRAM N/A 09/03/2018   Procedure: ABDOMINAL AORTOGRAM;  Surgeon: Jeanette Mitchell, MD;  Location: [place] [facility];  Service: Cardiovascular;  Laterality: N/A;  . ABDOMINAL AORTOGRAM W/LOWER EXTREMITY N/A 04/16/2018   Procedure: ABDOMINAL AORTOGRAM W/LOWER EXTREMITY;  Surgeon: Jeanette Mitchell, MD;  Location: [place]  [facility];  Service: Cardiovascular;  Laterality: N/A;  unilateral  . ABDOMINAL AORTOGRAM W/LOWER EXTREMITY Bilateral 04/08/2020   Procedure: ABDOMINAL AORTOGRAM W/LOWER EXTREMITY;  Surgeon: Jeanette Mitchell, MD;  Location: [place] [facility];  Service: Cardiovascular;  Laterality: Bilateral;  . ABDOMINAL HYSTERECTOMY    . AMPUTATION Left 12/03/2018   Procedure: Left 3rd ray amputation;  Surgeon: Jeanette Simmer, MD;  Location: [place];  Service: Orthopedics;  Laterality: Left;  45min  . AMPUTATION Right 06/22/2020   Procedure: AMPUTATION BELOW KNEE;  Surgeon: Jeanette Simmer, MD;  Location: [place];  Service: Orthopedics;  Laterality: Right;  . CHOLECYSTECTOMY    . CORONARY ANGIOPLASTY WITH STENT PLACEMENT  08/09/2011   DES  to mid circumflex  . I & D EXTREMITY Left 10/29/2015   Procedure: Irrigation and Debridement Left Foot;  Surgeon: Jeanette Minion, MD;  Location: [place];  Service: Orthopedics;  Laterality: Left;  . LEFT HEART CATHETERIZATION WITH CORONARY ANGIOGRAM N/A 08/08/2012   Procedure: LEFT HEART CATHETERIZATION WITH CORONARY ANGIOGRAM;  Surgeon: Jeanette Booze, MD;  Location: [facility];  Service: Cardiovascular;  Laterality: N/A;  . PERIPHERAL VASCULAR ATHERECTOMY  04/16/2018   Procedure: PERIPHERAL VASCULAR ATHERECTOMY;  Surgeon: Jeanette Mitchell, MD;  Location: [place] [facility];  Service: Cardiovascular;;  lt. Peroneal  . PERIPHERAL VASCULAR ATHERECTOMY Right 04/08/2020   Procedure: PERIPHERAL VASCULAR ATHERECTOMY;  Surgeon: [name],  Jeanette Penny, MD;  Location: [place] [facility];  Service: Cardiovascular;  Laterality: Right;  SFA and Peroneal  . PERIPHERAL VASCULAR BALLOON ANGIOPLASTY  04/16/2018   Procedure: PERIPHERAL VASCULAR BALLOON ANGIOPLASTY;  Surgeon: Jeanette Mitchell, MD;  Location: [place] [facility];  Service: Cardiovascular;;  lt. sfa and AT  . PERIPHERAL VASCULAR BALLOON ANGIOPLASTY Left 09/03/2018   Procedure: PERIPHERAL VASCULAR BALLOON ANGIOPLASTY;  Surgeon:  Jeanette Mitchell, MD;  Location: [place] [facility];  Service: Cardiovascular;  Laterality: Left;  TP TRUNK  . PERIPHERAL VASCULAR BALLOON ANGIOPLASTY Right 04/08/2020   Procedure: PERIPHERAL VASCULAR BALLOON ANGIOPLASTY;  Surgeon: Jeanette Mitchell, MD;  Location: [place] [facility];  Service: Cardiovascular;  Laterality: Right;  SFA (DCB), Peroneal  . PERIPHERAL VASCULAR CATHETERIZATION N/A 12/30/2014   Procedure: Lower Extremity Angiography;  Surgeon: Wellington Hampshire, MD;  Location: [place] [facility];  Service: Cardiovascular;  Laterality: N/A;  . PERIPHERAL VASCULAR INTERVENTION Left 09/03/2018   Procedure: PERIPHERAL VASCULAR INTERVENTION;  Surgeon: Jeanette Mitchell, MD;  Location: [place] [facility];  Service: Cardiovascular;  Laterality: Left;  SFA STENT   . TENDON RELEASE Right 03/11/2020   Procedure: Heel Cord Lengthening;  Surgeon: Jeanette Simmer, MD;  Location: [place];  Service: Orthopedics;  Laterality: Right;  . THYROID SURGERY     radioactive iodine   . TONSILLECTOMY    . TRANSMETATARSAL AMPUTATION Right 03/11/2020   Procedure: Right foot transmetatarsal amputation;  Surgeon: Jeanette Simmer, MD;  Location: [place];  Service: Orthopedics;  Laterality: Right;    There were no vitals filed for this visit.   Subjective Assessment - 11/22/20 1105    Subjective She is having issues with blood glucose is flucuating.  She is wearing most of awake hours.    Patient is accompained by: Family member   son, [name]   Pertinent History right TTA, anxiety, CHF, IDDM, HTN, PVD, LE muscle cramps,    Patient Stated Goals to use prosthesis in community & apt complex    Currently in Pain? No/denies    Pain Onset 1 to 4 weeks ago                             [facility] Adult PT Treatment/Exercise - 11/22/20 1101      Transfers   Transfers Sit to Stand;Stand to Sit    Sit to Stand 5: Supervision;With upper extremity assist;With armrests;From  chair/3-in-1   goal to not touch RW to stabilize   Sit to Stand Details Visual cues/gestures for sequencing;Verbal cues for technique    Stand to Sit 5: Supervision;With upper extremity assist;With armrests;To chair/3-in-1;To elevated surface;Other (comment)   goal to  not touch RW for stability   Stand to Sit Details (indicate cue type and reason) Verbal cues for technique;Visual cues/gestures for sequencing      Ambulation/Gait   Ambulation/Gait Yes    Ambulation/Gait Assistance 5: Supervision    Ambulation Distance (Feet) 100 Feet   130' X 2   Assistive device Rolling walker;Prosthesis    Gait Comments PT instructed with verbal cues & handout in walking program - see pt instructions. Pt verbalized understanding.      Self-Care   Self-Care ADL's    ADL's Pt reported catching prosthetic toe on lower cabinet when working in kitchen.  PT demo stepping back away from counter with prosthesis first which she verbalized & return demo understanding.  Exercises   Exercises Knee/Hip      Knee/Hip Exercises: Aerobic   Nustep --      Knee/Hip Exercises: Standing   Forward Step Up --    Forward Step Up Limitations --    Step Down --    Step Down Limitations --    Rocker Board --    Rocker Board Limitations --      Knee/Hip Exercises: Seated   Sit to [employer] --      Prosthetics   Prosthetic Care Comments  PT educated on adjusting ply socks with too few, too many & correct ply fit including handout.    Current prosthetic wear tolerance (days/week)  daily    Current prosthetic wear tolerance (#hours/day)  all awake hours    Current prosthetic weight-bearing tolerance (hours/day)  Pt tolerated standing for 5 min with partial weight on prosthesis with no anterior tibial pain    Edema pitting    Residual limb condition  no open areas, frial skin, no hair growth, normal color & temperature    Education Provided Proper wear schedule/adjustment;Other (comment);Correct ply sock adjustment   see  prosthetic care comments   Person(s) Educated Patient    Education Method Explanation;Demonstration;Tactile cues;Verbal cues;Handout    Education Method Verbalized understanding;Returned demonstration;Tactile cues required;Verbal cues required;Needs further instruction    Donning Prosthesis Supervision            Hanger Socks: 1-ply is yellow color at top, 3-ply is green at top, 5-ply is navy blue at top How many ply you need depends on your limb size.  You should have even pressure on your limb when standing & walking.  Guidance points: 1. How ease it goes on? Should be some resistance. Too few it goes on too easily. Too many it takes a lot of work to get it on. 2. How many clicks you get. Especially clicks in sitting. 3. After standing or walking, check knee cap. Bottom should be just under the front lip.  Too few bottom of knee cap sits on indention. Too many bottom is above front lip. 4. Have your feet beside each other & hips over feet. Place hands on your waist. Pelvis Should be level. Too few prosthetic side will be low. Too many prosthetic side will be high.    Get ply socks correct before you leave the house. Take extra socks with you. Take one 3-ply and two 1-ply with you. This is in addition to what you are wearing.    Increasing your activity level is important. Short distances which is walking from one room to another. Work to increase frequency back to prior level. Medium distances are entering & exiting your home or community with limited distances. Start with 4 medium walks which is one outing to one location and increase number of tolerated amounts. Long distance is your highest tolerance for you. Walk until you feel you must rest. Back or leg pain or general fatigue are indicators to maximum tolerance. Monitor by distance or time. Try to walk your BEST distance 1-2 times per day. You should see this increase over time.          PT Short Term Goals - 11/22/20 1553       PT SHORT TERM GOAL #1   Title Patient verbalizes how to adjust ply socks correctly.    Time 4    Period Weeks    Status New    Target Date 12/09/20      PT SHORT  TERM GOAL #2   Title Patient tolerates prosthesis >90% of awake hrsday without skin issues or limb pain <3/10 after standing.    Time 4    Period Weeks    Status Revised    Target Date 12/09/20      PT SHORT TERM GOAL #3   Title Patient able to stand 2 minutes without UE support safely    Time 4    Period Weeks    Status Revised    Target Date 12/09/20      PT SHORT TERM GOAL #4   Title Patient ambulates2500' with RW & prosthesis with supervision.    Time 4    Period Weeks    Status Revised    Target Date 12/09/20      PT SHORT TERM GOAL #5   Title Patient negotiates ramps & curbs with RW & prosthesis with supervision    Time 4    Period Weeks    Status Revised    Target Date 12/09/20             PT Long Term Goals - 10/19/20 1254      PT LONG TERM GOAL #1   Title Patient demonstrates & verbalized understanding of prosthetic care to enable safe utilization of prosthesis.    Time 12    Period Weeks    Status New    Target Date 01/06/21      PT LONG TERM GOAL #2   Title Patient tolerates prosthesis wear >90% of awake hours without skin or limb pain issues.    Time 12    Period Weeks    Status On-going    Target Date 01/06/21      PT LONG TERM GOAL #3   Title Berg Balance >/= 36/56 to indicate lower fall risk    Time 12    Period Weeks    Status On-going    Target Date 01/06/21      PT LONG TERM GOAL #4   Title Patient ambulates 300' with LRAD & prosthesis modified independent.    Time 12    Period Weeks    Status On-going    Target Date 01/06/21      PT LONG TERM GOAL #5   Title Patient negotiates ramps, curbs & stairs with LRAD & prosthesis modified independent.    Time 12    Period Weeks    Status On-going    Target Date 01/06/21                 Plan - 11/22/20 1104     Clinical Impression Statement PT educated pt on adjusting ply socks and she appears to have better understanding. PT also instructed in walking program to improve stamina & endurance which she appears to understand.    Personal Factors and Comorbidities Age;Comorbidity 3+;Fitness;Time since onset of injury/illness/exacerbation;Transportation    Comorbidities right TTA, anxiety, CHF, IDDM, HTN, PVD, LE muscle cramps    Examination-Activity Limitations Lift;Locomotion Level;Squat;Stairs;Stand;Transfers    Examination-Participation Restrictions Community Activity;Meal Prep    Stability/Clinical Decision Making Evolving/Moderate complexity    Rehab Potential Good    PT Frequency 2x / week    PT Duration 12 weeks    PT Treatment/Interventions ADLs/Self Care Home Management;DME Instruction;Gait training;Stair training;Functional mobility training;Therapeutic activities;Therapeutic exercise;Neuromuscular re-education;Patient/family education;Prosthetic Training;Scar mobilization;Passive range of motion    PT Next Visit Plan review prosthetic care, balance activities,  prosthetic gait with RW or rollator including ramps & curbs    Consulted and  Agree with Plan of Care Patient           Patient will benefit from skilled therapeutic intervention in order to improve the following deficits and impairments:  Abnormal gait,Decreased activity tolerance,Decreased balance,Decreased endurance,Decreased knowledge of use of DME,Decreased mobility,Decreased skin integrity,Decreased strength,Increased edema,Impaired flexibility,Postural dysfunction,Prosthetic Dependency,Pain  Visit Diagnosis: Other abnormalities of gait and mobility  Unsteadiness on feet  Muscle weakness (generalized)  Abnormal posture  Pain in right lower leg     Problem List Patient Active Problem List   Diagnosis Date Noted  . Diabetic infection of right foot (HCC) 06/19/2020  . Chronic osteomyelitis of left foot (HCC) 06/19/2020   . HCAP (healthcare-associated pneumonia)   . Elevated troponin level not due myocardial infarction 03/13/2020  . GERD without esophagitis 03/13/2020  . Anemia 03/13/2020  . Thyroid goiter 03/13/2020  . Acute respiratory failure with hypoxia (HCC) 03/13/2020  . Type 2 diabetes mellitus with complication, without long-term current use of insulin (HCC) 03/13/2020  . Elevated liver enzymes 03/13/2020  . Acute on chronic congestive heart failure (HCC) 03/13/2020  . Acute cardiogenic pulmonary edema (HCC) 03/12/2020  . Diabetic ulcer of right foot (HCC) 03/12/2020  . Metatarsalgia of left foot 03/14/2019  . Amputated toe (HCC) 12/06/2018  . Achilles tendon contracture, left 07/24/2016  . Ulcer of left foot, limited to breakdown of skin (HCC) 06/01/2016  . Cellulitis 10/27/2015  . Severe peripheral arterial disease (HCC) 12/28/2014  . Chronic diastolic heart failure (HCC) 12/30/2013  . Edema 12/30/2013  . Mixed hyperlipidemia 06/24/2013  . Mitral valve disorders(424.0) 06/24/2013  . Coronary artery disease involving native coronary artery of native heart   . Essential hypertension   . Diabetes mellitus with peripheral vascular disease (HCC)   . Other primary cardiomyopathies     Vladimir Faster, PT, DPT 11/22/2020, 3:57 PM  [facility] Physical Therapy [address] [place], [place], [address] Phone: [phone]   Fax:  [phone]  Name: [name] MRN: [id number] Date of Birth: 02/09/1932

## 2020-11-22 NOTE — Patient Instructions (Addendum)
Hanger Socks: 1-ply is yellow color at top, 3-ply is green at top, 5-ply is navy blue at top How many ply you need depends on your limb size.  You should have even pressure on your limb when standing & walking.  Guidance points: 1. How ease it goes on? Should be some resistance. Too few it goes on too easily. Too many it takes a lot of work to get it on. 2. How many clicks you get. Especially clicks in sitting. 3. After standing or walking, check knee cap. Bottom should be just under the front lip.  Too few bottom of knee cap sits on indention. Too many bottom is above front lip. 4. Have your feet beside each other & hips over feet. Place hands on your waist. Pelvis Should be level. Too few prosthetic side will be low. Too many prosthetic side will be high.    Get ply socks correct before you leave the house. Take extra socks with you. Take one 3-ply and two 1-ply with you. This is in addition to what you are wearing.    Increasing your activity level is important. Short distances which is walking from one room to another. Work to increase frequency back to prior level. Medium distances are entering & exiting your home or community with limited distances. Start with 4 medium walks which is one outing to one location and increase number of tolerated amounts. Long distance is your highest tolerance for you. Walk until you feel you must rest. Back or leg pain or general fatigue are indicators to maximum tolerance. Monitor by distance or time. Try to walk your BEST distance 1-2 times per day. You should see this increase over time.

## 2020-11-23 ENCOUNTER — Other Ambulatory Visit: Payer: Self-pay

## 2020-11-23 ENCOUNTER — Encounter: Payer: Self-pay | Admitting: Interventional Cardiology

## 2020-11-23 ENCOUNTER — Ambulatory Visit: Payer: HMO | Admitting: Interventional Cardiology

## 2020-11-23 VITALS — BP 142/76 | HR 104 | Ht 68.0 in | Wt 169.6 lb

## 2020-11-23 DIAGNOSIS — E782 Mixed hyperlipidemia: Secondary | ICD-10-CM | POA: Diagnosis not present

## 2020-11-23 DIAGNOSIS — I739 Peripheral vascular disease, unspecified: Secondary | ICD-10-CM | POA: Diagnosis not present

## 2020-11-23 DIAGNOSIS — I25119 Atherosclerotic heart disease of native coronary artery with unspecified angina pectoris: Secondary | ICD-10-CM

## 2020-11-23 DIAGNOSIS — E1159 Type 2 diabetes mellitus with other circulatory complications: Secondary | ICD-10-CM

## 2020-11-23 DIAGNOSIS — Z794 Long term (current) use of insulin: Secondary | ICD-10-CM

## 2020-11-23 NOTE — Patient Instructions (Signed)

## 2020-11-24 ENCOUNTER — Ambulatory Visit: Payer: HMO | Admitting: Physical Therapy

## 2020-11-24 ENCOUNTER — Encounter: Payer: Self-pay | Admitting: Physical Therapy

## 2020-11-24 DIAGNOSIS — R2689 Other abnormalities of gait and mobility: Secondary | ICD-10-CM | POA: Diagnosis not present

## 2020-11-24 DIAGNOSIS — M6281 Muscle weakness (generalized): Secondary | ICD-10-CM | POA: Diagnosis not present

## 2020-11-24 DIAGNOSIS — M79661 Pain in right lower leg: Secondary | ICD-10-CM | POA: Diagnosis not present

## 2020-11-24 DIAGNOSIS — R2681 Unsteadiness on feet: Secondary | ICD-10-CM

## 2020-11-24 DIAGNOSIS — R293 Abnormal posture: Secondary | ICD-10-CM | POA: Diagnosis not present

## 2020-11-24 NOTE — Therapy (Signed)
[facility] [address] [place], [place], [address] Phone: [phone]   Fax:  [phone]  Physical Therapy Treatment  Patient Details  Name: [name] MRN: [id number] Date of Birth: 06-05-1932 Referring Provider (PT): Marya Amsler [place], [place]   Encounter Date: 11/24/2020   PT End of Session - 11/24/20 1107    Visit Number 11    Number of Visits 25    Date for PT Re-Evaluation 01/06/21    Authorization Type [employer]    Authorization Time Period $20 copay    PT Start Time 1100    PT Stop Time 1140    PT Time Calculation (min) 40 min    Equipment Utilized During Treatment Gait belt    Activity Tolerance Patient tolerated treatment well;Patient limited by pain    Behavior During Therapy [facility] for tasks assessed/performed           Past Medical History:  Diagnosis Date  . Anxiety   . Cellulitis 10/2015   LEFT FOOT  . CHF (congestive heart failure) ([place])   . Complication of anesthesia   . Coronary artery disease   . Diabetes mellitus without complication (HCC)    insulin dependent  . GERD (gastroesophageal reflux disease)   . Hypertension   . Hypothyroidism   . Neuromuscular disorder (HCC)    muscle cramps to lower extremities  . Other primary cardiomyopathies   . Peripheral vascular disease ([place])   . PONV (postoperative nausea and vomiting)   . Shortness of breath     Past Surgical History:  Procedure Laterality Date  . ABDOMINAL AORTOGRAM N/A 09/03/2018   Procedure: ABDOMINAL AORTOGRAM;  Surgeon: Serafina Mitchell, MD;  Location: [place] [facility];  Service: Cardiovascular;  Laterality: N/A;  . ABDOMINAL AORTOGRAM W/LOWER EXTREMITY N/A 04/16/2018   Procedure: ABDOMINAL AORTOGRAM W/LOWER EXTREMITY;  Surgeon: Serafina Mitchell, MD;  Location: [place] [facility];  Service: Cardiovascular;  Laterality: N/A;  unilateral  . ABDOMINAL AORTOGRAM W/LOWER EXTREMITY Bilateral 04/08/2020   Procedure: ABDOMINAL AORTOGRAM  W/LOWER EXTREMITY;  Surgeon: Serafina Mitchell, MD;  Location: [place] [facility];  Service: Cardiovascular;  Laterality: Bilateral;  . ABDOMINAL HYSTERECTOMY    . AMPUTATION Left 12/03/2018   Procedure: Left 3rd ray amputation;  Surgeon: Wylene Simmer, MD;  Location: [place];  Service: Orthopedics;  Laterality: Left;  20min  . AMPUTATION Right 06/22/2020   Procedure: AMPUTATION BELOW KNEE;  Surgeon: Wylene Simmer, MD;  Location: [place];  Service: Orthopedics;  Laterality: Right;  . CHOLECYSTECTOMY    . CORONARY ANGIOPLASTY WITH STENT PLACEMENT  08/09/2011   DES  to mid circumflex  . I & D EXTREMITY Left 10/29/2015   Procedure: Irrigation and Debridement Left Foot;  Surgeon: Newt Minion, MD;  Location: [place];  Service: Orthopedics;  Laterality: Left;  . LEFT HEART CATHETERIZATION WITH CORONARY ANGIOGRAM N/A 08/08/2012   Procedure: LEFT HEART CATHETERIZATION WITH CORONARY ANGIOGRAM;  Surgeon: [name], MD;  Location: [facility] C[facility] LAB;  Service: Cardiovascular;  Laterality: N/A;  . PERIPHERAL VASCULAR ATHERECTOMY  04/16/2018   Procedure: PERIPHERAL VASCULAR ATHERECTOMY;  Surgeon: Serafina Mitchell, MD;  Location: [place] [facility];  Service: Cardiovascular;;  lt. Peroneal  . PERIPHERAL VASCULAR ATHERECTOMY Right 04/08/2020   Procedure: PERIPHERAL VASCULAR ATHERECTOMY;  Surgeon: Serafina Mitchell, MD;  Location: [place] [facility];  Service: Cardiovascular;  Laterality: Right;  SFA and Peroneal  . PERIPHERAL VASCULAR BALLOON ANGIOPLASTY  04/16/2018   Procedure:  PERIPHERAL VASCULAR BALLOON ANGIOPLASTY;  Surgeon: Serafina Mitchell, MD;  Location: [place] [facility];  Service: Cardiovascular;;  lt. sfa and AT  . PERIPHERAL VASCULAR BALLOON ANGIOPLASTY Left 09/03/2018   Procedure: PERIPHERAL VASCULAR BALLOON ANGIOPLASTY;  Surgeon: Serafina Mitchell, MD;  Location: [place] [facility];  Service: Cardiovascular;  Laterality: Left;  TP TRUNK  . PERIPHERAL VASCULAR BALLOON ANGIOPLASTY Right  04/08/2020   Procedure: PERIPHERAL VASCULAR BALLOON ANGIOPLASTY;  Surgeon: Serafina Mitchell, MD;  Location: [place] [facility];  Service: Cardiovascular;  Laterality: Right;  SFA (DCB), Peroneal  . PERIPHERAL VASCULAR CATHETERIZATION N/A 12/30/2014   Procedure: Lower Extremity Angiography;  Surgeon: Wellington Hampshire, MD;  Location: [place] [facility];  Service: Cardiovascular;  Laterality: N/A;  . PERIPHERAL VASCULAR INTERVENTION Left 09/03/2018   Procedure: PERIPHERAL VASCULAR INTERVENTION;  Surgeon: Serafina Mitchell, MD;  Location: [place] [facility];  Service: Cardiovascular;  Laterality: Left;  SFA STENT   . TENDON RELEASE Right 03/11/2020   Procedure: Heel Cord Lengthening;  Surgeon: Wylene Simmer, MD;  Location: [place];  Service: Orthopedics;  Laterality: Right;  . THYROID SURGERY     radioactive iodine   . TONSILLECTOMY    . TRANSMETATARSAL AMPUTATION Right 03/11/2020   Procedure: Right foot transmetatarsal amputation;  Surgeon: Wylene Simmer, MD;  Location: [place];  Service: Orthopedics;  Laterality: Right;    There were no vitals filed for this visit.   Subjective Assessment - 11/24/20 1100    Subjective She has been working on adjusting ply socks.  She is still wearing prosthesis all awake hours but distal tibia gets a little sore.    Patient is accompained by: Family member   [name], [name]   Pertinent History right TTA, anxiety, CHF, IDDM, HTN, PVD, LE muscle cramps,    Patient Stated Goals to use prosthesis in community & apt complex    Currently in Pain? No/denies    Pain Onset 1 to 4 weeks ago                             [facility] Adult PT Treatment/Exercise - 11/24/20 1100      Transfers   Transfers Sit to Stand;Stand to Sit    Sit to Stand 5: Supervision;With upper extremity assist;With armrests;From chair/3-in-1;4: Min assist   MinA to stabilize with cane   Sit to Stand Details Visual cues/gestures for sequencing;Verbal  cues for technique    Stand to Sit 5: Supervision;With upper extremity assist;With armrests;To chair/3-in-1;To elevated surface;Other (comment);4: Min guard   min gaurd stabilizing with cane   Stand to Sit Details (indicate cue type and reason) Verbal cues for technique;Visual cues/gestures for sequencing      Ambulation/Gait   Ambulation/Gait Yes    Ambulation/Gait Assistance 5: Supervision;3: Mod assist   modA with cane requiring HHA or counter support RUE   Ambulation/Gait Assistance Details PT instructed in sequence & technique with cane use.  Pt required HHA or counter support RUE with cane LUE.    Ambulation Distance (Feet) 30 Feet   30' X 2 cane/HHA & 10' cane/counter support   Assistive device Rolling walker;Prosthesis;Straight cane   stand alone tip on cane   Ambulation Surface Level;Indoor      Knee/Hip Exercises: Aerobic   Nustep Seat 10 level 6 for 8 min (some rest breaks needed)      Prosthetics   Prosthetic Care Comments  PT instructed  in using cutoff Vivewear sock under liner for wound healing. Pt has 2 pair of vivewear socks at home. PT recommended cutting off larger pair.  PT applied Tegaderm until able to use Vivewear.    Current prosthetic wear tolerance (days/week)  daily    Current prosthetic wear tolerance (#hours/day)  all awake hours    Current prosthetic weight-bearing tolerance (hours/day)  Pt tolerated standing for 5 min with partial weight on prosthesis with no anterior tibial pain    Edema pitting    Residual limb condition  open superficial wound on distal tibia 1.5cm long X 0.3cm wide, frial skin, no hair growth, normal color & temperature    Education Provided Proper wear schedule/adjustment;Other (comment);Correct ply sock adjustment;Skin check;Residual limb care   see prosthetic care comments   Person(s) Educated Patient    Education Method Explanation;Verbal cues;Demonstration    Education Method Verbalized understanding;Tactile cues required;Verbal cues  required;Needs further instruction    Donning Prosthesis Supervision                    PT Short Term Goals - 11/22/20 1553      PT SHORT TERM GOAL #1   Title Patient verbalizes how to adjust ply socks correctly.    Time 4    Period Weeks    Status New    Target Date 12/09/20      PT SHORT TERM GOAL #2   Title Patient tolerates prosthesis >90% of awake hrsday without skin issues or limb pain <3/10 after standing.    Time 4    Period Weeks    Status Revised    Target Date 12/09/20      PT SHORT TERM GOAL #3   Title Patient able to stand 2 minutes without UE support safely    Time 4    Period Weeks    Status Revised    Target Date 12/09/20      PT SHORT TERM GOAL #4   Title Patient ambulates2500' with RW & prosthesis with supervision.    Time 4    Period Weeks    Status Revised    Target Date 12/09/20      PT SHORT TERM GOAL #5   Title Patient negotiates ramps & curbs with RW & prosthesis with supervision    Time 4    Period Weeks    Status Revised    Target Date 12/09/20             PT Long Term Goals - 10/19/20 1254      PT LONG TERM GOAL #1   Title Patient demonstrates & verbalized understanding of prosthetic care to enable safe utilization of prosthesis.    Time 12    Period Weeks    Status New    Target Date 01/06/21      PT LONG TERM GOAL #2   Title Patient tolerates prosthesis wear >90% of awake hours without skin or limb pain issues.    Time 12    Period Weeks    Status On-going    Target Date 01/06/21      PT LONG TERM GOAL #3   Title Berg Balance >/= 36/56 to indicate lower fall risk    Time 12    Period Weeks    Status On-going    Target Date 01/06/21      PT LONG TERM GOAL #4   Title Patient ambulates 300' with LRAD & prosthesis modified independent.    Time 12  Period Weeks    Status On-going    Target Date 01/06/21      PT LONG TERM GOAL #5   Title Patient negotiates ramps, curbs & stairs with LRAD & prosthesis  modified independent.    Time 12    Period Weeks    Status On-going    Target Date 01/06/21                 Plan - 11/24/20 1410    Clinical Impression Statement Patient had difficulty with low blood glucose during PT.  PT introduced prosthetic gait with cane today but she requires HHA or support RUE in addition to cane LUE.  PT educated on wound healing with prosthesis and she appears to have basic understanding.    PT Next Visit Plan work towards [place]. check wound on limb.  balance activities and progress gait with cane    Consulted and Agree with Plan of Care Patient           Patient will benefit from skilled therapeutic intervention in order to improve the following deficits and impairments:  Abnormal gait,Decreased activity tolerance,Decreased balance,Decreased endurance,Decreased knowledge of use of DME,Decreased mobility,Decreased skin integrity,Decreased strength,Increased edema,Impaired flexibility,Postural dysfunction,Prosthetic Dependency,Pain  Visit Diagnosis: Other abnormalities of gait and mobility  Unsteadiness on feet  Muscle weakness (generalized)  Abnormal posture  Pain in right lower leg     Problem List Patient Active Problem List   Diagnosis Date Noted  . Diabetic infection of right foot ([place]) 06/19/2020  . Chronic osteomyelitis of left foot ([place]) 06/19/2020  . HCAP (healthcare-associated pneumonia)   . Elevated troponin level not due myocardial infarction 03/13/2020  . GERD without esophagitis 03/13/2020  . Anemia 03/13/2020  . Thyroid goiter 03/13/2020  . Acute respiratory failure with hypoxia ([place]) 03/13/2020  . Type 2 diabetes mellitus with complication, without long-term current use of insulin ([place]) 03/13/2020  . Elevated liver enzymes 03/13/2020  . Acute on chronic congestive heart failure ([place]) 03/13/2020  . Acute cardiogenic pulmonary edema (HCC) 03/12/2020  . Diabetic ulcer of right foot ([place]) 03/12/2020  . Metatarsalgia of left  foot 03/14/2019  . Amputated toe ([place]) 12/06/2018  . Achilles tendon contracture, left 07/24/2016  . Ulcer of left foot, limited to breakdown of skin ([place]) 06/01/2016  . Cellulitis 10/27/2015  . Severe peripheral arterial disease ([place]) 12/28/2014  . Chronic diastolic heart failure ([place]) 12/30/2013  . Edema 12/30/2013  . Mixed hyperlipidemia 06/24/2013  . Mitral valve disorders(424.0) 06/24/2013  . Coronary artery disease involving native coronary artery of native heart   . Essential hypertension   . Diabetes mellitus with peripheral vascular disease ([place])   . Other primary cardiomyopathies     Jamey Reas, PT, DPT 11/24/2020, 2:13 PM  [facility] [address] [place], [place], [address] Phone: [phone]   Fax:  [phone]  Name: [name] MRN: [id number] Date of Birth: [birth year]

## 2020-12-08 ENCOUNTER — Encounter: Payer: HMO | Admitting: Physical Therapy

## 2020-12-15 ENCOUNTER — Encounter: Payer: HMO | Admitting: Physical Therapy

## 2020-12-20 ENCOUNTER — Encounter: Payer: HMO | Admitting: Physical Therapy

## 2020-12-22 ENCOUNTER — Ambulatory Visit: Payer: HMO | Admitting: Physical Therapy

## 2020-12-22 ENCOUNTER — Other Ambulatory Visit: Payer: Self-pay

## 2020-12-22 ENCOUNTER — Encounter: Payer: Self-pay | Admitting: Physical Therapy

## 2020-12-22 DIAGNOSIS — R293 Abnormal posture: Secondary | ICD-10-CM

## 2020-12-22 DIAGNOSIS — R2681 Unsteadiness on feet: Secondary | ICD-10-CM | POA: Diagnosis not present

## 2020-12-22 DIAGNOSIS — M6281 Muscle weakness (generalized): Secondary | ICD-10-CM

## 2020-12-22 DIAGNOSIS — R2689 Other abnormalities of gait and mobility: Secondary | ICD-10-CM | POA: Diagnosis not present

## 2020-12-22 NOTE — Therapy (Signed)
[facility] Physical Therapy [address] [place], [place], [address] Phone: [phone]   Fax:  [phone]  Physical Therapy Treatment  Patient Details  Name: CORNELIA WALRAVEN MRN: [id number] Date of Birth: 1931-10-21 Referring Provider (PT): Marya Amsler [place], [place]   Encounter Date: 12/22/2020   PT End of Session - 12/22/20 1213    Visit Number 12    Number of Visits 25    Date for PT Re-Evaluation 01/06/21    Authorization Type [employer]    Authorization Time Period $20 copay    PT Start Time 1015    PT Stop Time 1100    PT Time Calculation (min) 45 min    Equipment Utilized During Treatment Gait belt    Activity Tolerance Patient tolerated treatment well    Behavior During Therapy [facility] for tasks assessed/performed           Past Medical History:  Diagnosis Date  . Anxiety   . Cellulitis 10/2015   LEFT FOOT  . CHF (congestive heart failure) ([place])   . Complication of anesthesia   . Coronary artery disease   . Diabetes mellitus without complication (HCC)    insulin dependent  . GERD (gastroesophageal reflux disease)   . Hypertension   . Hypothyroidism   . Neuromuscular disorder (HCC)    muscle cramps to lower extremities  . Other primary cardiomyopathies   . Peripheral vascular disease ([place])   . PONV (postoperative nausea and vomiting)   . Shortness of breath     Past Surgical History:  Procedure Laterality Date  . ABDOMINAL AORTOGRAM N/A 09/03/2018   Procedure: ABDOMINAL AORTOGRAM;  Surgeon: Serafina Mitchell, MD;  Location: [place] [facility];  Service: Cardiovascular;  Laterality: N/A;  . ABDOMINAL AORTOGRAM W/LOWER EXTREMITY N/A 04/16/2018   Procedure: ABDOMINAL AORTOGRAM W/LOWER EXTREMITY;  Surgeon: Serafina Mitchell, MD;  Location: [place] [facility];  Service: Cardiovascular;  Laterality: N/A;  unilateral  . ABDOMINAL AORTOGRAM W/LOWER EXTREMITY Bilateral 04/08/2020   Procedure: ABDOMINAL AORTOGRAM W/LOWER EXTREMITY;  Surgeon:  Serafina Mitchell, MD;  Location: [place] [facility];  Service: Cardiovascular;  Laterality: Bilateral;  . ABDOMINAL HYSTERECTOMY    . AMPUTATION Left 12/03/2018   Procedure: Left 3rd ray amputation;  Surgeon: Wylene Simmer, MD;  Location: [place];  Service: Orthopedics;  Laterality: Left;  57mn  . AMPUTATION Right 06/22/2020   Procedure: AMPUTATION BELOW KNEE;  Surgeon: HWylene Simmer MD;  [place]  Service: Orthopedics;  Laterality: Right;  . CHOLECYSTECTOMY    . CORONARY ANGIOPLASTY WITH STENT PLACEMENT  08/09/2011   DES  to mid circumflex  . I & D EXTREMITY Left 10/29/2015   Procedure: Irrigation and Debridement Left Foot;  Surgeon: MNewt Minion MD;  [place]  Service: Orthopedics;  Laterality: Left;  . LEFT HEART CATHETERIZATION WITH CORONARY ANGIOGRAM N/A 08/08/2012   Procedure: LEFT HEART CATHETERIZATION WITH CORONARY ANGIOGRAM;  Surgeon: JJettie Booze MD;  [facility];  Service: Cardiovascular;  Laterality: N/A;  . PERIPHERAL VASCULAR ATHERECTOMY  04/16/2018   Procedure: PERIPHERAL VASCULAR ATHERECTOMY;  Surgeon: BSerafina Mitchell MD;  [facility];  Service: Cardiovascular;;  lt. Peroneal  . PERIPHERAL VASCULAR ATHERECTOMY Right 04/08/2020   Procedure: PERIPHERAL VASCULAR ATHERECTOMY;  Surgeon: BSerafina Mitchell MD;  [place];  Service: Cardiovascular;  Laterality: Right;  SFA and Peroneal  . PERIPHERAL VASCULAR BALLOON ANGIOPLASTY  04/16/2018   Procedure: PERIPHERAL VASCULAR BALLOON  ANGIOPLASTY;  Surgeon: Serafina Mitchell, MD;  Location: [place] [facility];  Service: Cardiovascular;;  lt. sfa and AT  . PERIPHERAL VASCULAR BALLOON ANGIOPLASTY Left 09/03/2018   Procedure: PERIPHERAL VASCULAR BALLOON ANGIOPLASTY;  Surgeon: Serafina Mitchell, MD;  Location: [place] [facility];  Service: Cardiovascular;  Laterality: Left;  TP TRUNK  . PERIPHERAL VASCULAR BALLOON ANGIOPLASTY Right 04/08/2020   Procedure: PERIPHERAL  VASCULAR BALLOON ANGIOPLASTY;  Surgeon: Serafina Mitchell, MD;  Location: [place] [facility];  Service: Cardiovascular;  Laterality: Right;  SFA (DCB), Peroneal  . PERIPHERAL VASCULAR CATHETERIZATION N/A 12/30/2014   Procedure: Lower Extremity Angiography;  Surgeon: Wellington Hampshire, MD;  Location: [place] [facility];  Service: Cardiovascular;  Laterality: N/A;  . PERIPHERAL VASCULAR INTERVENTION Left 09/03/2018   Procedure: PERIPHERAL VASCULAR INTERVENTION;  Surgeon: Serafina Mitchell, MD;  Location: [place] [facility];  Service: Cardiovascular;  Laterality: Left;  SFA STENT   . TENDON RELEASE Right 03/11/2020   Procedure: Heel Cord Lengthening;  Surgeon: Wylene Simmer, MD;  Location: [place];  Service: Orthopedics;  Laterality: Right;  . THYROID SURGERY     radioactive iodine   . TONSILLECTOMY    . TRANSMETATARSAL AMPUTATION Right 03/11/2020   Procedure: Right foot transmetatarsal amputation;  Surgeon: Wylene Simmer, MD;  Location: [place];  Service: Orthopedics;  Laterality: Right;    There were no vitals filed for this visit.   Subjective Assessment - 12/22/20 1015    Subjective Her husband passed away and she has been busy.  She wears prosthesis daily for most awake hours.    Patient is accompained by: Family member   son, Ishia Tenorio   Pertinent History right TTA, anxiety, CHF, IDDM, HTN, PVD, LE muscle cramps,    Patient Stated Goals to use prosthesis in community & apt complex    Currently in Pain? No/denies    Pain Onset 1 to 4 weeks ago                             [facility] Adult PT Treatment/Exercise - 12/22/20 1015      Transfers   Transfers Sit to Stand;Stand to Sit    Sit to Stand 5: Supervision;With upper extremity assist;With armrests;From chair/3-in-1;Other (comment)   to locked rollator walker   Stand to Sit 5: Supervision;With upper extremity assist;With armrests;To chair/3-in-1;To elevated surface;Other (comment)   from locked  rollator walker     Ambulation/Gait   Ambulation/Gait Yes    Ambulation/Gait Assistance 5: Supervision    Ambulation/Gait Assistance Details verbal cues on upright posture looking forward / not staring at ground    Ambulation Distance (Feet) 100 Feet   100' x 2   Assistive device Prosthesis;Rollator      Neuro Re-ed    Neuro Re-ed Details  standing with upright upper body / looking forward - not staring at floor and even weight bearing on feet with pelvic weight shift to midline with tactile & verbal cues.      Prosthetics   Prosthetic Care Comments  PT instructed to use cutoff Vivewear shrinkage directly in contact with wound (no bandaid).  PT demo & instructed in changing shoes with prosthesis.  PT demo & instructed in liner pin alignment slightly posteriorly to pull soft tissue forward towards distal tibia.  Wearing prosthesis upon arising in mornings until bed time.  Walk into bathroom sitting on tub bench to bathe, wash liner while  showering & switch liners to dry one for next 24 hrs (removing for bed & redonne upon arising). PT reviewed adjusting ply socks.    Current prosthetic wear tolerance (days/week)  daily    Current prosthetic wear tolerance (#hours/day)  all awake hours    Current prosthetic weight-bearing tolerance (hours/day)  Pt tolerated standing for 5 min with partial weight on prosthesis with no anterior tibial pain    Edema pitting    Residual limb condition  open superficial wound on distal tibia 1.5cm long X 0.3cm wide with granulation present, no hair growth, normal color & temperature    Education Provided Proper wear schedule/adjustment;Other (comment);Correct ply sock adjustment;Skin check;Residual limb care;Prosthetic cleaning   see prosthetic care comments   Person(s) Educated Patient    Education Method Explanation;Demonstration;Tactile cues;Verbal cues    Education Method Verbalized understanding;Tactile cues required;Verbal cues required;Needs further instruction     Donning Prosthesis Supervision                    PT Short Term Goals - 12/22/20 1214      PT SHORT TERM GOAL #1   Title Patient verbalizes how to adjust ply socks correctly.    Baseline 12/22/2020 progressing but not met as pt's husband's death missed 2 weeks of PT    Time 4    Period Weeks    Status On-going    Target Date 01/13/21      PT SHORT TERM GOAL #2   Title Patient tolerates prosthesis >90% of awake hrsday without skin issues or limb pain <3/10 after standing.    Baseline 12/22/2020 progressing but not met as pt's husband's death missed 2 weeks of PT    Time 4    Period Weeks    Status On-going    Target Date 01/13/21      PT SHORT TERM GOAL #3   Title Patient able to stand 2 minutes without UE support safely    Baseline 12/22/2020 progressing but not met as pt's husband's death missed 2 weeks of PT    Time 4    Period Weeks    Status On-going    Target Date 01/13/21      PT SHORT TERM GOAL #4   Title Patient ambulates 250' with RW & prosthesis with supervision.    Baseline 12/22/2020 progressing but not met as pt's husband's death missed 2 weeks of PT    Time 4    Period Weeks    Status On-going    Target Date 01/13/21      PT SHORT TERM GOAL #5   Title Patient negotiates ramps & curbs with RW & prosthesis with supervision    Baseline 12/22/2020 progressing but not met as pt's husband's death missed 2 weeks of PT    Time 4    Period Weeks    Status On-going    Target Date 01/13/21             PT Long Term Goals - 10/19/20 1254      PT LONG TERM GOAL #1   Title Patient demonstrates & verbalized understanding of prosthetic care to enable safe utilization of prosthesis.    Time 12    Period Weeks    Status New    Target Date 01/06/21      PT LONG TERM GOAL #2   Title Patient tolerates prosthesis wear >90% of awake hours without skin or limb pain issues.    Time 12  Period Weeks    Status On-going    Target Date 01/06/21      PT LONG  TERM GOAL #3   Title Berg Balance >/= 36/56 to indicate lower fall risk    Time 12    Period Weeks    Status On-going    Target Date 01/06/21      PT LONG TERM GOAL #4   Title Patient ambulates 300' with LRAD & prosthesis modified independent.    Time 12    Period Weeks    Status On-going    Target Date 01/06/21      PT LONG TERM GOAL #5   Title Patient negotiates ramps, curbs & stairs with LRAD & prosthesis modified independent.    Time 12    Period Weeks    Status On-going    Target Date 01/06/21                 Plan - 12/22/20 1220    Clinical Impression Statement Patient missed PT for >2 weeks due to illness & death of her husband. PT reset target date on STGs. PT educated patient on prosthetic issues including changing shoes which she appears to have improved understanding.    Personal Factors and Comorbidities Age;Comorbidity 3+;Fitness;Time since onset of injury/illness/exacerbation;Transportation    Comorbidities right TTA, anxiety, CHF, IDDM, HTN, PVD, LE muscle cramps    Examination-Activity Limitations Lift;Locomotion Level;Squat;Stairs;Stand;Transfers    Examination-Participation Restrictions Community Activity;Meal Prep    Stability/Clinical Decision Making Evolving/Moderate complexity    Rehab Potential Good    PT Frequency 2x / week    PT Duration 12 weeks    PT Treatment/Interventions ADLs/Self Care Home Management;DME Instruction;Gait training;Stair training;Functional mobility training;Therapeutic activities;Therapeutic exercise;Neuromuscular re-education;Patient/family education;Prosthetic Training;Scar mobilization;Passive range of motion    PT Next Visit Plan review prosthetic care, balance activities,  prosthetic gait with rollator including ramps & curbs    Consulted and Agree with Plan of Care Patient           Patient will benefit from skilled therapeutic intervention in order to improve the following deficits and impairments:  Abnormal  gait,Decreased activity tolerance,Decreased balance,Decreased endurance,Decreased knowledge of use of DME,Decreased mobility,Decreased skin integrity,Decreased strength,Increased edema,Impaired flexibility,Postural dysfunction,Prosthetic Dependency,Pain  Visit Diagnosis: Other abnormalities of gait and mobility  Unsteadiness on feet  Muscle weakness (generalized)  Abnormal posture     Problem List Patient Active Problem List   Diagnosis Date Noted  . Diabetic infection of right foot ([place]) 06/19/2020  . Chronic osteomyelitis of left foot ([place]) 06/19/2020  . HCAP (healthcare-associated pneumonia)   . Elevated troponin level not due myocardial infarction 03/13/2020  . GERD without esophagitis 03/13/2020  . Anemia 03/13/2020  . Thyroid goiter 03/13/2020  . Acute respiratory failure with hypoxia ([place]) 03/13/2020  . Type 2 diabetes mellitus with complication, without long-term current use of insulin ([place]) 03/13/2020  . Elevated liver enzymes 03/13/2020  . Acute on chronic congestive heart failure ([place]) 03/13/2020  . Acute cardiogenic pulmonary edema (HCC) 03/12/2020  . Diabetic ulcer of right foot ([place]) 03/12/2020  . Metatarsalgia of left foot 03/14/2019  . Amputated toe ([place]) 12/06/2018  . Achilles tendon contracture, left 07/24/2016  . Ulcer of left foot, limited to breakdown of skin ([place]) 06/01/2016  . Cellulitis 10/27/2015  . Severe peripheral arterial disease ([place]) 12/28/2014  . Chronic diastolic heart failure ([place]) 12/30/2013  . Edema 12/30/2013  . Mixed hyperlipidemia 06/24/2013  . Mitral valve disorders(424.0) 06/24/2013  . Coronary artery disease involving native coronary artery  of native heart   . Essential hypertension   . Diabetes mellitus with peripheral vascular disease ([place])   . Other primary cardiomyopathies     Jamey Reas, PT, DPT 12/22/2020, 12:23 PM  [facility] Physical Therapy [address] [place], [place], [address] Phone:  [phone]   Fax:  [phone]  Name: JAZARIAH TEALL MRN: [id number] Date of Birth: Apr 05, 1932

## 2020-12-27 ENCOUNTER — Other Ambulatory Visit: Payer: Self-pay

## 2020-12-27 ENCOUNTER — Ambulatory Visit (INDEPENDENT_AMBULATORY_CARE_PROVIDER_SITE_OTHER): Payer: HMO | Admitting: Physical Therapy

## 2020-12-27 ENCOUNTER — Encounter: Payer: HMO | Admitting: Physical Therapy

## 2020-12-27 ENCOUNTER — Encounter: Payer: Self-pay | Admitting: Physical Therapy

## 2020-12-27 DIAGNOSIS — R2681 Unsteadiness on feet: Secondary | ICD-10-CM

## 2020-12-27 DIAGNOSIS — M6281 Muscle weakness (generalized): Secondary | ICD-10-CM

## 2020-12-27 DIAGNOSIS — R293 Abnormal posture: Secondary | ICD-10-CM

## 2020-12-27 DIAGNOSIS — R2689 Other abnormalities of gait and mobility: Secondary | ICD-10-CM

## 2020-12-27 DIAGNOSIS — Z9181 History of falling: Secondary | ICD-10-CM

## 2020-12-27 DIAGNOSIS — M79661 Pain in right lower leg: Secondary | ICD-10-CM

## 2020-12-27 NOTE — Therapy (Signed)
[facility] Physical Therapy [address] [place], [place], [address] Phone: [phone]   Fax:  [phone]  Physical Therapy Treatment  Patient Details  Name: [name] MRN: [id number] Date of Birth: [birth year] Referring Provider (PT): Marya Amsler [employer], [place]   Encounter Date: 12/27/2020   PT End of Session - 12/27/20 1110     Visit Number 13    Number of Visits 25    Date for PT Re-Evaluation 01/06/21    Authorization Type [employer]    Authorization Time Period $20 copay    PT Start Time 1100    PT Stop Time 1145    PT Time Calculation (min) 45 min    Equipment Utilized During Treatment Gait belt    Activity Tolerance Patient tolerated treatment well    Behavior During Therapy [facility] for tasks assessed/performed             Past Medical History:  Diagnosis Date   Anxiety    Cellulitis 10/2015   LEFT FOOT   CHF (congestive heart failure) ([place])    Complication of anesthesia    Coronary artery disease    Diabetes mellitus without complication (HCC)    insulin dependent   GERD (gastroesophageal reflux disease)    Hypertension    Hypothyroidism    Neuromuscular disorder ([place])    muscle cramps to lower extremities   Other primary cardiomyopathies    Peripheral vascular disease (HCC)    PONV (postoperative nausea and vomiting)    Shortness of breath     Past Surgical History:  Procedure Laterality Date   ABDOMINAL AORTOGRAM N/A 09/03/2018   Procedure: ABDOMINAL AORTOGRAM;  Surgeon: Serafina Mitchell, MD;  Location: [place] [facility];  Service: Cardiovascular;  Laterality: N/A;   ABDOMINAL AORTOGRAM W/LOWER EXTREMITY N/A 04/16/2018   Procedure: ABDOMINAL AORTOGRAM W/LOWER EXTREMITY;  Surgeon: Serafina Mitchell, MD;  Location: [place] [facility];  Service: Cardiovascular;  Laterality: N/A;  unilateral   ABDOMINAL AORTOGRAM W/LOWER EXTREMITY Bilateral 04/08/2020   Procedure: ABDOMINAL AORTOGRAM W/LOWER EXTREMITY;  Surgeon: Serafina Mitchell, MD;  Location: [place] [facility];  Service: Cardiovascular;  Laterality: Bilateral;   ABDOMINAL HYSTERECTOMY     AMPUTATION Left 12/03/2018   Procedure: Left 3rd ray amputation;  Surgeon: Wylene Simmer, MD;  Location: [place];  Service: Orthopedics;  Laterality: Left;  82mn   AMPUTATION Right 06/22/2020   Procedure: AMPUTATION BELOW KNEE;  Surgeon: HWylene Simmer MD;  [place]  Service: Orthopedics;  Laterality: Right;   CHOLECYSTECTOMY     CORONARY ANGIOPLASTY WITH STENT PLACEMENT  08/09/2011   DES  to mid circumflex   I & D EXTREMITY Left 10/29/2015   Procedure: Irrigation and Debridement Left Foot;  Surgeon: MNewt Minion MD;  [place]  Service: Orthopedics;  Laterality: Left;   LEFT HEART CATHETERIZATION WITH CORONARY ANGIOGRAM N/A 08/08/2012   Procedure: LEFT HEART CATHETERIZATION WITH CORONARY ANGIOGRAM;  Surgeon: JJettie Booze MD;  [facility];  Service: Cardiovascular;  Laterality: N/A;   PERIPHERAL VASCULAR ATHERECTOMY  04/16/2018   Procedure: PERIPHERAL VASCULAR ATHERECTOMY;  Surgeon: BSerafina Mitchell MD;  [facility];  Service: Cardiovascular;;  lt. Peroneal   PERIPHERAL VASCULAR ATHERECTOMY Right 04/08/2020   Procedure: PERIPHERAL VASCULAR ATHERECTOMY;  Surgeon: BSerafina Mitchell MD;  [facility];  Service: Cardiovascular;  Laterality: Right;  SFA and Peroneal   PERIPHERAL VASCULAR BALLOON ANGIOPLASTY  04/16/2018   Procedure:  PERIPHERAL VASCULAR BALLOON ANGIOPLASTY;  Surgeon: Serafina Mitchell, MD;  Location: [place] [facility];  Service: Cardiovascular;;  lt. sfa and AT   PERIPHERAL VASCULAR BALLOON ANGIOPLASTY Left 09/03/2018   Procedure: PERIPHERAL VASCULAR BALLOON ANGIOPLASTY;  Surgeon: Serafina Mitchell, MD;  Location: [place] [facility];  Service: Cardiovascular;  Laterality: Left;  TP TRUNK   PERIPHERAL VASCULAR BALLOON ANGIOPLASTY Right 04/08/2020   Procedure: PERIPHERAL VASCULAR BALLOON  ANGIOPLASTY;  Surgeon: Serafina Mitchell, MD;  Location: [place] [facility];  Service: Cardiovascular;  Laterality: Right;  SFA (DCB), Peroneal   PERIPHERAL VASCULAR CATHETERIZATION N/A 12/30/2014   Procedure: Lower Extremity Angiography;  Surgeon: Wellington Hampshire, MD;  Location: [place] [facility];  Service: Cardiovascular;  Laterality: N/A;   PERIPHERAL VASCULAR INTERVENTION Left 09/03/2018   Procedure: PERIPHERAL VASCULAR INTERVENTION;  Surgeon: Serafina Mitchell, MD;  Location: [place] [facility];  Service: Cardiovascular;  Laterality: Left;  SFA STENT    TENDON RELEASE Right 03/11/2020   Procedure: Heel Cord Lengthening;  Surgeon: Wylene Simmer, MD;  Location: [place];  Service: Orthopedics;  Laterality: Right;   THYROID SURGERY     radioactive iodine    TONSILLECTOMY     TRANSMETATARSAL AMPUTATION Right 03/11/2020   Procedure: Right foot transmetatarsal amputation;  Surgeon: Wylene Simmer, MD;  Location: [place];  Service: Orthopedics;  Laterality: Right;    There were no vitals filed for this visit.   Subjective Assessment - 12/27/20 1111     Subjective She is wearing prosthesis all awake hours. She removes to nap in her bed.    Patient is accompained by: Family member   son, Kattaleya Alia   Pertinent History right TTA, anxiety, CHF, IDDM, HTN, PVD, LE muscle cramps,    Patient Stated Goals to use prosthesis in community & apt complex    Currently in Pain? No/denies    Pain Onset 1 to 4 weeks ago                               [facility] Adult PT Treatment/Exercise - 12/27/20 1100       Transfers   Transfers Sit to Stand;Stand to Sit    Sit to Stand 5: Supervision;With upper extremity assist;With armrests;From chair/3-in-1;Other (comment)   to locked rollator walker   Stand to Sit 5: Supervision;With upper extremity assist;With armrests;To chair/3-in-1;To elevated surface;Other (comment)   from locked rollator walker     Ambulation/Gait    Ambulation/Gait Yes    Ambulation/Gait Assistance 5: Supervision    Ambulation Distance (Feet) 100 Feet   100' x 2   Assistive device Prosthesis;Rollator      High Level Balance   High Level Balance Activities Side stepping;Backward walking    High Level Balance Comments in //bars with BUE support progressing to one UE support for balance NBOS with head turns, head nods, diagonal head turns, and eyes closed      Neuro Re-ed    Neuro Re-ed Details  --      Knee/Hip Exercises: Aerobic   Nustep Seat 12 level 6 for 8 min cues for full knee ext with motion      Knee/Hip Exercises: Standing   Terminal Knee Extension Strengthening;Right;3 sets;10 reps;Theraband    Theraband Level (Terminal Knee Extension) Level 3 (Green)    Terminal Knee Extension Limitations 1st set bilateral stance, 2nd set initial contact / forward step, 3rd set terminal stance /  back step    Rocker Board 1 minute   square board w/2 pivot points, ant/level/post & right/level/left   Rocker Board Limitations square board, BUE support, tactile  & verbal cues on technique.      Knee/Hip Exercises: Seated   Sit to Sand 1 set;10 reps;without UE support;Other (comment)   from 24" bar stool     Prosthetics   Prosthetic Care Comments  --    Current prosthetic wear tolerance (days/week)  daily    Current prosthetic wear tolerance (#hours/day)  all awake hours    Current prosthetic weight-bearing tolerance (hours/day)  Pt tolerated standing for 5 min with partial weight on prosthesis with no anterior tibial pain    Edema pitting    Residual limb condition  open superficial wound on distal tibia 1.5cm long X 0.3cm wide with granulation present, no hair growth, normal color & temperature    Education Provided Proper wear schedule/adjustment;Other (comment);Correct ply sock adjustment;Residual limb care   see prosthetic care comments   Person(s) Educated Patient    Education Method Explanation;Demonstration;Tactile cues;Verbal  cues    Education Method Verbalized understanding;Verbal cues required;Needs further instruction                 Balance Exercises - 12/27/20 1015       Balance Exercises: Standing   Standing Eyes Opened Wide (BOA);Head turns;5 reps;Solid surface   right/left, up/down & diagonal   Standing Eyes Opened Limitations MinA / tactile, verbal & visual cues on balance reactions    Standing Eyes Closed Wide (BOA);Solid surface;5 reps   right/left, up/down & diagonal   Standing Eyes Closed Limitations MinA / tactile, verbal & visual cues on balance reactions                 PT Short Term Goals - 12/22/20 1214       PT SHORT TERM GOAL #1   Title Patient verbalizes how to adjust ply socks correctly.    Baseline 12/22/2020 progressing but not met as pt's husband's death missed 2 weeks of PT    Time 4    Period Weeks    Status On-going    Target Date 01/13/21      PT SHORT TERM GOAL #2   Title Patient tolerates prosthesis >90% of awake hrsday without skin issues or limb pain <3/10 after standing.    Baseline 12/22/2020 progressing but not met as pt's husband's death missed 2 weeks of PT    Time 4    Period Weeks    Status On-going    Target Date 01/13/21      PT SHORT TERM GOAL #3   Title Patient able to stand 2 minutes without UE support safely    Baseline 12/22/2020 progressing but not met as pt's husband's death missed 2 weeks of PT    Time 4    Period Weeks    Status On-going    Target Date 01/13/21      PT SHORT TERM GOAL #4   Title Patient ambulates 250' with RW & prosthesis with supervision.    Baseline 12/22/2020 progressing but not met as pt's husband's death missed 2 weeks of PT    Time 4    Period Weeks    Status On-going    Target Date 01/13/21      PT SHORT TERM GOAL #5   Title Patient negotiates ramps & curbs with RW & prosthesis with supervision    Baseline 12/22/2020 progressing but not met as  pt's husband's death missed 2 weeks of PT    Time 4    Period  Weeks    Status On-going    Target Date 01/13/21               PT Long Term Goals - 10/19/20 1254       PT LONG TERM GOAL #1   Title Patient demonstrates & verbalized understanding of prosthetic care to enable safe utilization of prosthesis.    Time 12    Period Weeks    Status New    Target Date 01/06/21      PT LONG TERM GOAL #2   Title Patient tolerates prosthesis wear >90% of awake hours without skin or limb pain issues.    Time 12    Period Weeks    Status On-going    Target Date 01/06/21      PT LONG TERM GOAL #3   Title Berg Balance >/= 36/56 to indicate lower fall risk    Time 12    Period Weeks    Status On-going    Target Date 01/06/21      PT LONG TERM GOAL #4   Title Patient ambulates 300' with LRAD & prosthesis modified independent.    Time 12    Period Weeks    Status On-going    Target Date 01/06/21      PT LONG TERM GOAL #5   Title Patient negotiates ramps, curbs & stairs with LRAD & prosthesis modified independent.    Time 12    Period Weeks    Status On-going    Target Date 01/06/21                   Plan - 12/27/20 1111     Clinical Impression Statement Patient tolerated more activities with less time today.  PT worked on balance activities which she improved with instruction.  Patient is on target to STGs by next week's target date.    Personal Factors and Comorbidities Age;Comorbidity 3+;Fitness;Time since onset of injury/illness/exacerbation;Transportation    Comorbidities right TTA, anxiety, CHF, IDDM, HTN, PVD, LE muscle cramps    Examination-Activity Limitations Lift;Locomotion Level;Squat;Stairs;Stand;Transfers    Examination-Participation Restrictions Community Activity;Meal Prep    Stability/Clinical Decision Making Evolving/Moderate complexity    Rehab Potential Good    PT Frequency 2x / week    PT Duration 12 weeks    PT Treatment/Interventions ADLs/Self Care Home Management;DME Instruction;Gait training;Stair  training;Functional mobility training;Therapeutic activities;Therapeutic exercise;Neuromuscular re-education;Patient/family education;Prosthetic Training;Scar mobilization;Passive range of motion    PT Next Visit Plan review prosthetic care, balance activities,  prosthetic gait with rollator including ramps & curbs    Consulted and Agree with Plan of Care Patient             Patient will benefit from skilled therapeutic intervention in order to improve the following deficits and impairments:  Abnormal gait, Decreased activity tolerance, Decreased balance, Decreased endurance, Decreased knowledge of use of DME, Decreased mobility, Decreased skin integrity, Decreased strength, Increased edema, Impaired flexibility, Postural dysfunction, Prosthetic Dependency, Pain  Visit Diagnosis: Other abnormalities of gait and mobility  Unsteadiness on feet  Muscle weakness (generalized)  Abnormal posture  Pain in right lower leg  History of falling     Problem List Patient Active Problem List   Diagnosis Date Noted   Diabetic infection of right foot ([place]) 06/19/2020   Chronic osteomyelitis of left foot ([place]) 06/19/2020   HCAP (healthcare-associated pneumonia)    Elevated troponin  level not due myocardial infarction 03/13/2020   GERD without esophagitis 03/13/2020   Anemia 03/13/2020   Thyroid goiter 03/13/2020   Acute respiratory failure with hypoxia ([place]) 03/13/2020   Type 2 diabetes mellitus with complication, without long-term current use of insulin ([place]) 03/13/2020   Elevated liver enzymes 03/13/2020   Acute on chronic congestive heart failure ([place]) 03/13/2020   Acute cardiogenic pulmonary edema ([place]) 03/12/2020   Diabetic ulcer of right foot ([place]) 03/12/2020   Metatarsalgia of left foot 03/14/2019   Amputated toe ([place]) 12/06/2018   Achilles tendon contracture, left 07/24/2016   Ulcer of left foot, limited to breakdown of skin ([place]) 06/01/2016   Cellulitis 10/27/2015   Severe  peripheral arterial disease ([place]) 12/28/2014   Chronic diastolic heart failure ([place]) 12/30/2013   Edema 12/30/2013   Mixed hyperlipidemia 06/24/2013   Mitral valve disorders(424.0) 06/24/2013   Coronary artery disease involving native coronary artery of native heart    Essential hypertension    Diabetes mellitus with peripheral vascular disease ([place])    Other primary cardiomyopathies     Jamey Reas, PT, DPT 12/27/2020, 4:08 PM  [facility] Physical Therapy [address] [place], [place], [address] Phone: [phone]   Fax:  [phone]  Name: AUDRIS SPEAKER MRN: [id number] Date of Birth: 1931/11/24

## 2020-12-29 ENCOUNTER — Other Ambulatory Visit: Payer: Self-pay

## 2020-12-29 ENCOUNTER — Ambulatory Visit (INDEPENDENT_AMBULATORY_CARE_PROVIDER_SITE_OTHER): Payer: HMO | Admitting: Physical Therapy

## 2020-12-29 ENCOUNTER — Encounter: Payer: HMO | Admitting: Physical Therapy

## 2020-12-29 ENCOUNTER — Encounter: Payer: Self-pay | Admitting: Physical Therapy

## 2020-12-29 DIAGNOSIS — R2681 Unsteadiness on feet: Secondary | ICD-10-CM

## 2020-12-29 DIAGNOSIS — M6281 Muscle weakness (generalized): Secondary | ICD-10-CM | POA: Diagnosis not present

## 2020-12-29 DIAGNOSIS — R2689 Other abnormalities of gait and mobility: Secondary | ICD-10-CM

## 2020-12-29 DIAGNOSIS — R293 Abnormal posture: Secondary | ICD-10-CM | POA: Diagnosis not present

## 2020-12-29 DIAGNOSIS — M79661 Pain in right lower leg: Secondary | ICD-10-CM | POA: Diagnosis not present

## 2020-12-29 NOTE — Patient Instructions (Signed)
Increasing your activity level is important. °Short distances which is walking from one room to another. Work to increase frequency back to prior level. °Medium distances are entering & exiting your home or community with limited distances. Start with 4 medium walks which is one outing to one location and increase number of tolerated amounts. °Long distance is your highest tolerance for you. Walk until you feel you must rest. Back or leg pain or general fatigue are indicators to maximum tolerance. Monitor by distance or time. Try to walk your BEST distance 1-2 times per day. You should see this increase over time.  °

## 2020-12-29 NOTE — Therapy (Signed)
[facility] [address] [place], [place], [address] Phone: [phone]   Fax:  [phone]  Physical Therapy Treatment  Patient Details  Name: [name] MRN: [id number] Date of Birth: 1931-08-17 Referring Provider (PT): Marya Amsler [place], [place]   Encounter Date: 12/29/2020   PT End of Session - 12/29/20 1110     Visit Number 14    Number of Visits 25    Date for PT Re-Evaluation 01/06/21    Authorization Type [employer]    Authorization Time Period $20 copay    PT Start Time 1102    PT Stop Time 1148    PT Time Calculation (min) 46 min    Equipment Utilized During Treatment Gait belt    Activity Tolerance Patient tolerated treatment well    Behavior During Therapy [facility] for tasks assessed/performed             Past Medical History:  Diagnosis Date   Anxiety    Cellulitis 10/2015   LEFT FOOT   CHF (congestive heart failure) ([place])    Complication of anesthesia    Coronary artery disease    Diabetes mellitus without complication (HCC)    insulin dependent   GERD (gastroesophageal reflux disease)    Hypertension    Hypothyroidism    Neuromuscular disorder (HCC)    muscle cramps to lower extremities   Other primary cardiomyopathies    Peripheral vascular disease (HCC)    PONV (postoperative nausea and vomiting)    Shortness of breath     Past Surgical History:  Procedure Laterality Date   ABDOMINAL AORTOGRAM N/A 09/03/2018   Procedure: ABDOMINAL AORTOGRAM;  Surgeon: Serafina Mitchell, MD;  Location: [place] [facility];  Service: Cardiovascular;  Laterality: N/A;   ABDOMINAL AORTOGRAM W/LOWER EXTREMITY N/A 04/16/2018   Procedure: ABDOMINAL AORTOGRAM W/LOWER EXTREMITY;  Surgeon: Serafina Mitchell, MD;  Location: [place] [facility];  Service: Cardiovascular;  Laterality: N/A;  unilateral   ABDOMINAL AORTOGRAM W/LOWER EXTREMITY Bilateral 04/08/2020   Procedure: ABDOMINAL AORTOGRAM W/LOWER EXTREMITY;  Surgeon: Serafina Mitchell, MD;  Location: [place] [facility];  Service: Cardiovascular;  Laterality: Bilateral;   ABDOMINAL HYSTERECTOMY     AMPUTATION Left 12/03/2018   Procedure: Left 3rd ray amputation;  Surgeon: Wylene Simmer, MD;  Location: [place];  Service: Orthopedics;  Laterality: Left;  61mn   AMPUTATION Right 06/22/2020   Procedure: AMPUTATION BELOW KNEE;  Surgeon: HWylene Simmer MD;  [place] [facility]  Service: Orthopedics;  Laterality: Right;   CHOLECYSTECTOMY     CORONARY ANGIOPLASTY WITH STENT PLACEMENT  08/09/2011   DES  to mid circumflex   I & D EXTREMITY Left 10/29/2015   Procedure: Irrigation and Debridement Left Foot;  Surgeon: MNewt Minion MD;  [facility]  Service: Orthopedics;  Laterality: Left;   LEFT HEART CATHETERIZATION WITH CORONARY ANGIOGRAM N/A 08/08/2012   Procedure: LEFT HEART CATHETERIZATION WITH CORONARY ANGIOGRAM;  Surgeon: JJettie Booze MD;  [facility];  Service: Cardiovascular;  Laterality: N/A;   PERIPHERAL VASCULAR ATHERECTOMY  04/16/2018   Procedure: PERIPHERAL VASCULAR ATHERECTOMY;  Surgeon: BSerafina Mitchell MD;  [facility];  Service: Cardiovascular;;  lt. Peroneal   PERIPHERAL VASCULAR ATHERECTOMY Right 04/08/2020   Procedure: PERIPHERAL VASCULAR ATHERECTOMY;  Surgeon: BSerafina Mitchell MD;  [facility];  Service: Cardiovascular;  Laterality: Right;  SFA and Peroneal   PERIPHERAL VASCULAR BALLOON ANGIOPLASTY  04/16/2018   Procedure:  PERIPHERAL VASCULAR BALLOON ANGIOPLASTY;  Surgeon: Serafina Mitchell, MD;  Location: [place] [facility];  Service: Cardiovascular;;  lt. sfa and AT   PERIPHERAL VASCULAR BALLOON ANGIOPLASTY Left 09/03/2018   Procedure: PERIPHERAL VASCULAR BALLOON ANGIOPLASTY;  Surgeon: Serafina Mitchell, MD;  Location: [place] [facility];  Service: Cardiovascular;  Laterality: Left;  TP TRUNK   PERIPHERAL VASCULAR BALLOON ANGIOPLASTY Right 04/08/2020   Procedure: PERIPHERAL VASCULAR BALLOON  ANGIOPLASTY;  Surgeon: Serafina Mitchell, MD;  Location: [place] [facility];  Service: Cardiovascular;  Laterality: Right;  SFA (DCB), Peroneal   PERIPHERAL VASCULAR CATHETERIZATION N/A 12/30/2014   Procedure: Lower Extremity Angiography;  Surgeon: Wellington Hampshire, MD;  Location: [place] [facility];  Service: Cardiovascular;  Laterality: N/A;   PERIPHERAL VASCULAR INTERVENTION Left 09/03/2018   Procedure: PERIPHERAL VASCULAR INTERVENTION;  Surgeon: Serafina Mitchell, MD;  Location: [place] [facility];  Service: Cardiovascular;  Laterality: Left;  SFA STENT    TENDON RELEASE Right 03/11/2020   Procedure: Heel Cord Lengthening;  Surgeon: Wylene Simmer, MD;  Location: [place];  Service: Orthopedics;  Laterality: Right;   THYROID SURGERY     radioactive iodine    TONSILLECTOMY     TRANSMETATARSAL AMPUTATION Right 03/11/2020   Procedure: Right foot transmetatarsal amputation;  Surgeon: Wylene Simmer, MD;  Location: [place];  Service: Orthopedics;  Laterality: Right;    There were no vitals filed for this visit.   Subjective Assessment - 12/29/20 1102     Subjective She was out of it after last PT as her blood glucose was low. She got lunch which corrected it.    Patient is accompained by: Family member   son, [name]   Pertinent History right TTA, anxiety, CHF, IDDM, HTN, PVD, LE muscle cramps,    Patient Stated Goals to use prosthesis in community & apt complex    Currently in Pain? No/denies    Pain Onset 1 to 4 weeks ago                               [facility] Adult PT Treatment/Exercise - 12/29/20 1102       Transfers   Transfers Sit to Stand;Stand to Sit    Sit to Stand 5: Supervision;With upper extremity assist;With armrests;From chair/3-in-1;Other (comment)   to locked rollator walker   Stand to Sit 5: Supervision;With upper extremity assist;With armrests;To chair/3-in-1;To elevated surface;Other (comment)   from locked rollator walker    Comments PT demo & verbal cues on technique for turning 180* for sit to/from stand on rollator seat.  Pt verbalized & return demo understanding.      Ambulation/Gait   Ambulation/Gait Yes    Ambulation/Gait Assistance 5: Supervision    Ambulation Distance (Feet) 150 Feet   150' X 2   Assistive device Prosthesis;Rollator    Ramp Details (indicate cue type and reason) PT reviewed with demo & verbal cues on safety & technique with rollator walker use on ramps. Pt verbalized understanding.    Curb Details (indicate cue type and reason) PT reviewed with demo & verbal cues on safety & technique with rollator walker use on curbs. Pt verbalized understanding.      High Level Balance   High Level Balance Activities --    High Level Balance Comments --      Self-Care   ADL's using 24" bar stool for ADLs as modified stand & with  no back or arm support facilitates trunk stability & endurance.  Pt verbalized understanding.      Neuro Re-ed    Neuro Re-ed Details  standing in corner with chair back in front for safe environment to challenge balance.  5 reps static stance up to 8sec, light touch index fingers on chair back head turns 4 directions & light finger tip on chair 3 reps eyes closed for 10 sec.      Knee/Hip Exercises: Aerobic   Nustep Seat 12 level 6 for 8 min cues for full knee ext with motion      Knee/Hip Exercises: Standing   Terminal Knee Extension --    Theraband Level (Terminal Knee Extension) --    Terminal Knee Extension Limitations --    Rocker Board --    Rocker Board Limitations --      Knee/Hip Exercises: Seated   Sit to Sand 1 set;10 reps;without UE support;Other (comment)   from 24" bar stool, goal to not touch //bars to stabilize     Prosthetics   Current prosthetic wear tolerance (days/week)  daily    Current prosthetic wear tolerance (#hours/day)  all awake hours    Current prosthetic weight-bearing tolerance (hours/day)  Pt tolerated standing for 5 min with  partial weight on prosthesis with no anterior tibial pain    Edema pitting    Residual limb condition  open superficial wound on distal tibia 1.5cm long X 0.3cm wide with granulation present, no hair growth, normal color & temperature    Education Provided --   see prosthetic care comments                   PT Education - 12/29/20 1153     Education Details Increasing activity level -see pt instructions & digestion rate of foods with blood glucose levels    Person(s) Educated Patient    Methods Explanation;Verbal cues;Handout    Comprehension Verbalized understanding;Verbal cues required;Need further instruction              PT Short Term Goals - 12/22/20 1214       PT SHORT TERM GOAL #1   Title Patient verbalizes how to adjust ply socks correctly.    Baseline 12/22/2020 progressing but not met as pt's husband's death missed 2 weeks of PT    Time 4    Period Weeks    Status On-going    Target Date 01/13/21      PT SHORT TERM GOAL #2   Title Patient tolerates prosthesis >90% of awake hrsday without skin issues or limb pain <3/10 after standing.    Baseline 12/22/2020 progressing but not met as pt's husband's death missed 2 weeks of PT    Time 4    Period Weeks    Status On-going    Target Date 01/13/21      PT SHORT TERM GOAL #3   Title Patient able to stand 2 minutes without UE support safely    Baseline 12/22/2020 progressing but not met as pt's husband's death missed 2 weeks of PT    Time 4    Period Weeks    Status On-going    Target Date 01/13/21      PT SHORT TERM GOAL #4   Title Patient ambulates 250' with RW & prosthesis with supervision.    Baseline 12/22/2020 progressing but not met as pt's husband's death missed 2 weeks of PT    Time 4    Period Weeks  Status On-going    Target Date 01/13/21      PT SHORT TERM GOAL #5   Title Patient negotiates ramps & curbs with RW & prosthesis with supervision    Baseline 12/22/2020 progressing but not met as  pt's husband's death missed 2 weeks of PT    Time 4    Period Weeks    Status On-going    Target Date 01/13/21               PT Long Term Goals - 10/19/20 1254       PT LONG TERM GOAL #1   Title Patient demonstrates & verbalized understanding of prosthetic care to enable safe utilization of prosthesis.    Time 12    Period Weeks    Status New    Target Date 01/06/21      PT LONG TERM GOAL #2   Title Patient tolerates prosthesis wear >90% of awake hours without skin or limb pain issues.    Time 12    Period Weeks    Status On-going    Target Date 01/06/21      PT LONG TERM GOAL #3   Title Berg Balance >/= 36/56 to indicate lower fall risk    Time 12    Period Weeks    Status On-going    Target Date 01/06/21      PT LONG TERM GOAL #4   Title Patient ambulates 300' with LRAD & prosthesis modified independent.    Time 12    Period Weeks    Status On-going    Target Date 01/06/21      PT LONG TERM GOAL #5   Title Patient negotiates ramps, curbs & stairs with LRAD & prosthesis modified independent.    Time 12    Period Weeks    Status On-going    Target Date 01/06/21                   Plan - 12/29/20 1110     Clinical Impression Statement Pt requires several rests periods during PT and needed juice with blood glucose drop today.  PT educated again on short, medium & long (her max tolerable distance) throughout her day to work on activity tolerance, stamina & endurance.  Pt verbalized understanding.    Personal Factors and Comorbidities Age;Comorbidity 3+;Fitness;Time since onset of injury/illness/exacerbation;Transportation    Comorbidities right TTA, anxiety, CHF, IDDM, HTN, PVD, LE muscle cramps    Examination-Activity Limitations Lift;Locomotion Level;Squat;Stairs;Stand;Transfers    Examination-Participation Restrictions Community Activity;Meal Prep    Stability/Clinical Decision Making Evolving/Moderate complexity    Rehab Potential Good    PT  Frequency 2x / week    PT Duration 12 weeks    PT Treatment/Interventions ADLs/Self Care Home Management;DME Instruction;Gait training;Stair training;Functional mobility training;Therapeutic activities;Therapeutic exercise;Neuromuscular re-education;Patient/family education;Prosthetic Training;Scar mobilization;Passive range of motion    PT Next Visit Plan review prosthetic care, balance activities,  prosthetic gait    Consulted and Agree with Plan of Care Patient             Patient will benefit from skilled therapeutic intervention in order to improve the following deficits and impairments:  Abnormal gait, Decreased activity tolerance, Decreased balance, Decreased endurance, Decreased knowledge of use of DME, Decreased mobility, Decreased skin integrity, Decreased strength, Increased edema, Impaired flexibility, Postural dysfunction, Prosthetic Dependency, Pain  Visit Diagnosis: Other abnormalities of gait and mobility  Unsteadiness on feet  Muscle weakness (generalized)  Abnormal posture  Pain in right lower  leg     Problem List Patient Active Problem List   Diagnosis Date Noted   Diabetic infection of right foot ([place]) 06/19/2020   Chronic osteomyelitis of left foot ([place]) 06/19/2020   HCAP (healthcare-associated pneumonia)    Elevated troponin level not due myocardial infarction 03/13/2020   GERD without esophagitis 03/13/2020   Anemia 03/13/2020   Thyroid goiter 03/13/2020   Acute respiratory failure with hypoxia ([place]) 03/13/2020   Type 2 diabetes mellitus with complication, without long-term current use of insulin ([place]) 03/13/2020   Elevated liver enzymes 03/13/2020   Acute on chronic congestive heart failure ([place]) 03/13/2020   Acute cardiogenic pulmonary edema ([place]) 03/12/2020   Diabetic ulcer of right foot ([place]) 03/12/2020   Metatarsalgia of left foot 03/14/2019   Amputated toe ([place]) 12/06/2018   Achilles tendon contracture, left 07/24/2016   Ulcer of left foot,  limited to breakdown of skin ([place]) 06/01/2016   Cellulitis 10/27/2015   Severe peripheral arterial disease ([place]) 12/28/2014   Chronic diastolic heart failure ([place]) 12/30/2013   Edema 12/30/2013   Mixed hyperlipidemia 06/24/2013   Mitral valve disorders(424.0) 06/24/2013   Coronary artery disease involving native coronary artery of native heart    Essential hypertension    Diabetes mellitus with peripheral vascular disease ([place])    Other primary cardiomyopathies     Jamey Reas, PT, DPT 12/29/2020, 12:06 PM  [facility] [address] [place], [place], [address] Phone: [phone]   Fax:  [phone]  Name: BAHJA BENCE MRN: [id number] Date of Birth: 31-Jan-1932

## 2021-01-03 ENCOUNTER — Ambulatory Visit (INDEPENDENT_AMBULATORY_CARE_PROVIDER_SITE_OTHER): Payer: HMO | Admitting: Physical Therapy

## 2021-01-03 ENCOUNTER — Encounter: Payer: Self-pay | Admitting: Physical Therapy

## 2021-01-03 ENCOUNTER — Other Ambulatory Visit: Payer: Self-pay

## 2021-01-03 DIAGNOSIS — R2681 Unsteadiness on feet: Secondary | ICD-10-CM | POA: Diagnosis not present

## 2021-01-03 DIAGNOSIS — M79661 Pain in right lower leg: Secondary | ICD-10-CM

## 2021-01-03 DIAGNOSIS — R2689 Other abnormalities of gait and mobility: Secondary | ICD-10-CM

## 2021-01-03 DIAGNOSIS — Z9181 History of falling: Secondary | ICD-10-CM

## 2021-01-03 DIAGNOSIS — R293 Abnormal posture: Secondary | ICD-10-CM | POA: Diagnosis not present

## 2021-01-03 DIAGNOSIS — M6281 Muscle weakness (generalized): Secondary | ICD-10-CM

## 2021-01-03 NOTE — Therapy (Signed)
[facility] Physical Therapy [address] [place], [place], [address] Phone: [phone]   Fax:  [phone]  Physical Therapy Treatment  Patient Details  Name: [name] MRN: [id number] Date of Birth: 1932-06-22 Referring Provider (PT): Marya Amsler [place], [place]   Encounter Date: 01/03/2021   PT End of Session - 01/03/21 1008     Visit Number 15    Number of Visits 25    Date for PT Re-Evaluation 01/06/21    Authorization Type [employer]    Authorization Time Period $20 copay    PT Start Time 1008    PT Stop Time 1055    PT Time Calculation (min) 47 min    Equipment Utilized During Treatment Gait belt    Activity Tolerance Patient tolerated treatment well    Behavior During Therapy [facility] for tasks assessed/performed             Past Medical History:  Diagnosis Date   Anxiety    Cellulitis 10/2015   LEFT FOOT   CHF (congestive heart failure) ([place])    Complication of anesthesia    Coronary artery disease    Diabetes mellitus without complication (HCC)    insulin dependent   GERD (gastroesophageal reflux disease)    Hypertension    Hypothyroidism    Neuromuscular disorder (HCC)    muscle cramps to lower extremities   Other primary cardiomyopathies    Peripheral vascular disease (HCC)    PONV (postoperative nausea and vomiting)    Shortness of breath     Past Surgical History:  Procedure Laterality Date   ABDOMINAL AORTOGRAM N/A 09/03/2018   Procedure: ABDOMINAL AORTOGRAM;  Surgeon: Serafina Mitchell, MD;  Location: [place] [facility];  Service: Cardiovascular;  Laterality: N/A;   ABDOMINAL AORTOGRAM W/LOWER EXTREMITY N/A 04/16/2018   Procedure: ABDOMINAL AORTOGRAM W/LOWER EXTREMITY;  Surgeon: Serafina Mitchell, MD;  Location: [place] [facility];  Service: Cardiovascular;  Laterality: N/A;  unilateral   ABDOMINAL AORTOGRAM W/LOWER EXTREMITY Bilateral 04/08/2020   Procedure: ABDOMINAL AORTOGRAM W/LOWER EXTREMITY;  Surgeon: Serafina Mitchell, MD;  Location: [place] [facility];  Service: Cardiovascular;  Laterality: Bilateral;   ABDOMINAL HYSTERECTOMY     AMPUTATION Left 12/03/2018   Procedure: Left 3rd ray amputation;  Surgeon: Wylene Simmer, MD;  Location: [place];  Service: Orthopedics;  Laterality: Left;  48mn   AMPUTATION Right 06/22/2020   Procedure: AMPUTATION BELOW KNEE;  Surgeon: HWylene Simmer MD;  [place] [facility]  Service: Orthopedics;  Laterality: Right;   CHOLECYSTECTOMY     CORONARY ANGIOPLASTY WITH STENT PLACEMENT  08/09/2011   DES  to mid circumflex   I & D EXTREMITY Left 10/29/2015   Procedure: Irrigation and Debridement Left Foot;  Surgeon: MNewt Minion MD;  [place]  Service: Orthopedics;  Laterality: Left;   LEFT HEART CATHETERIZATION WITH CORONARY ANGIOGRAM N/A 08/08/2012   Procedure: LEFT HEART CATHETERIZATION WITH CORONARY ANGIOGRAM;  Surgeon: JJettie Booze MD;  [facility];  Service: Cardiovascular;  Laterality: N/A;   PERIPHERAL VASCULAR ATHERECTOMY  04/16/2018   Procedure: PERIPHERAL VASCULAR ATHERECTOMY;  Surgeon: BSerafina Mitchell MD;  [facility];  Service: Cardiovascular;;  lt. Peroneal   PERIPHERAL VASCULAR ATHERECTOMY Right 04/08/2020   Procedure: PERIPHERAL VASCULAR ATHERECTOMY;  Surgeon: BSerafina Mitchell MD;  [facility];  Service: Cardiovascular;  Laterality: Right;  SFA and Peroneal   PERIPHERAL VASCULAR BALLOON ANGIOPLASTY  04/16/2018   Procedure:  PERIPHERAL VASCULAR BALLOON ANGIOPLASTY;  Surgeon: Serafina Mitchell, MD;  Location: [place] [facility];  Service: Cardiovascular;;  lt. sfa and AT   PERIPHERAL VASCULAR BALLOON ANGIOPLASTY Left 09/03/2018   Procedure: PERIPHERAL VASCULAR BALLOON ANGIOPLASTY;  Surgeon: Serafina Mitchell, MD;  Location: [place] [facility];  Service: Cardiovascular;  Laterality: Left;  TP TRUNK   PERIPHERAL VASCULAR BALLOON ANGIOPLASTY Right 04/08/2020   Procedure: PERIPHERAL VASCULAR BALLOON  ANGIOPLASTY;  Surgeon: Serafina Mitchell, MD;  Location: [place] [facility];  Service: Cardiovascular;  Laterality: Right;  SFA (DCB), Peroneal   PERIPHERAL VASCULAR CATHETERIZATION N/A 12/30/2014   Procedure: Lower Extremity Angiography;  Surgeon: Wellington Hampshire, MD;  Location: [place] [facility];  Service: Cardiovascular;  Laterality: N/A;   PERIPHERAL VASCULAR INTERVENTION Left 09/03/2018   Procedure: PERIPHERAL VASCULAR INTERVENTION;  Surgeon: Serafina Mitchell, MD;  Location: [place] [facility];  Service: Cardiovascular;  Laterality: Left;  SFA STENT    TENDON RELEASE Right 03/11/2020   Procedure: Heel Cord Lengthening;  Surgeon: Wylene Simmer, MD;  Location: [place];  Service: Orthopedics;  Laterality: Right;   THYROID SURGERY     radioactive iodine    TONSILLECTOMY     TRANSMETATARSAL AMPUTATION Right 03/11/2020   Procedure: Right foot transmetatarsal amputation;  Surgeon: Wylene Simmer, MD;  Location: [place];  Service: Orthopedics;  Laterality: Right;    There were no vitals filed for this visit.   Subjective Assessment - 01/03/21 1009     Subjective She walked a lot Thursday getting new phone going to 4 places. Then Saturday she had her husband's funeral with lots of walking with rollator.    Patient is accompained by: Family member   son, [name]   Pertinent History right TTA, anxiety, CHF, IDDM, HTN, PVD, LE muscle cramps,    Patient Stated Goals to use prosthesis in community & apt complex    Currently in Pain? No/denies    Pain Onset 1 to 4 weeks ago                [facility] PT Assessment - 01/03/21 1009       Assessment   Medical Diagnosis Z89.511 acquired absence of right leg below knee    Referring Provider (PT) Corky Sing, [place]      Berg Balance Test   Sit to Stand Needs minimal aid to stand or to stabilize   with RW =3   Standing Unsupported Unable to stand 30 seconds unassisted   with RW =4   Sitting with Back Unsupported  but Feet Supported on Floor or Stool Able to sit safely and securely 2 minutes    Stand to Sit Controls descent by using Matthews   with RW =4   Transfers Able to transfer safely, definite need of Matthews    Standing Unsupported with Eyes Closed Needs help to keep from falling   with RW =3   Standing Unsupported with Feet Together Needs help to attain position and unable to hold for 15 seconds   with RW =3   From Standing, Reach Forward with Outstretched Arm Loses balance while trying/requires external support   with RW =3   From Standing Position, Pick up Object from Floor Unable to try/needs assist to keep balance   with RW =3   From Standing Position, Turn to Look Behind Over each Shoulder Needs assist to keep from losing balance and falling   with RW =4   Turn  360 Degrees Needs assistance while turning   with RW =2   Standing Unsupported, Alternately Place Feet on Step/Stool Needs assistance to keep from falling or unable to try   with RW =3   Standing Unsupported, One Foot in [employer] balance while stepping or standing   with RW =3   Standing on One Leg Unable to try or needs assist to prevent fall   with RW =3   Total Score 11    Berg comment: tasks of Berg with locked rollator walker support = 41/ /56                           [facility] Adult PT Treatment/Exercise - 01/03/21 1009       Transfers   Transfers Sit to Stand;Stand to Sit    Sit to Stand 5: Supervision;With upper extremity assist;With armrests;From chair/3-in-1;Other (comment)   to locked rollator walker   Stand to Sit 5: Supervision;With upper extremity assist;With armrests;To chair/3-in-1;To elevated surface;Other (comment)   from locked rollator walker   Comments PT verbal cues on technique for turning 180* for sit to/from stand on rollator seat.  Pt verbalized & return demo understanding.      Ambulation/Gait   Ambulation/Gait Yes    Ambulation/Gait Assistance 5: Supervision    Ambulation Distance (Feet)  150 Feet   150' X 2   Assistive device Prosthesis;Rollator      Self-Care   ADL's using 24" bar stool for ADLs as modified stand & with no back or arm support facilitates trunk stability & endurance.  Pt verbalized understanding.      Neuro Re-ed    Neuro Re-ed Details  standing in //bars then progressed to locked rollator with single UE support: red theraband 10 reps ea BUEs rows, forward reach & biceps curls.  PT palpated core & LE stabilization.      Knee/Hip Exercises: Aerobic   Nustep --      Knee/Hip Exercises: Seated   Sit to Sand 1 set;10 reps;without UE support;Other (comment)   goal to not touch //bars to stabilze.     Prosthetics   Current prosthetic wear tolerance (days/week)  daily    Current prosthetic wear tolerance (#hours/day)  all awake hours    Current prosthetic weight-bearing tolerance (hours/day)  Pt tolerated standing for 5 min with partial weight on prosthesis with no anterior tibial pain    Edema pitting    Residual limb condition  --    Education Provided --   see prosthetic care comments                     PT Short Term Goals - 12/22/20 1214       PT SHORT TERM GOAL #1   Title Patient verbalizes how to adjust ply socks correctly.    Baseline 12/22/2020 progressing but not met as pt's husband's death missed 2 weeks of PT    Time 4    Period Weeks    Status On-going    Target Date 01/13/21      PT SHORT TERM GOAL #2   Title Patient tolerates prosthesis >90% of awake hrsday without skin issues or limb pain <3/10 after standing.    Baseline 12/22/2020 progressing but not met as pt's husband's death missed 2 weeks of PT    Time 4    Period Weeks    Status On-going    Target Date 01/13/21  PT SHORT TERM GOAL #3   Title Patient able to stand 2 minutes without UE support safely    Baseline 12/22/2020 progressing but not met as pt's husband's death missed 2 weeks of PT    Time 4    Period Weeks    Status On-going    Target Date 01/13/21       PT SHORT TERM GOAL #4   Title Patient ambulates 250' with RW & prosthesis with supervision.    Baseline 12/22/2020 progressing but not met as pt's husband's death missed 2 weeks of PT    Time 4    Period Weeks    Status On-going    Target Date 01/13/21      PT SHORT TERM GOAL #5   Title Patient negotiates ramps & curbs with RW & prosthesis with supervision    Baseline 12/22/2020 progressing but not met as pt's husband's death missed 2 weeks of PT    Time 4    Period Weeks    Status On-going    Target Date 01/13/21               PT Long Term Goals - 10/19/20 1254       PT LONG TERM GOAL #1   Title Patient demonstrates & verbalized understanding of prosthetic care to enable safe utilization of prosthesis.    Time 12    Period Weeks    Status New    Target Date 01/06/21      PT LONG TERM GOAL #2   Title Patient tolerates prosthesis wear >90% of awake hours without skin or limb pain issues.    Time 12    Period Weeks    Status On-going    Target Date 01/06/21      PT LONG TERM GOAL #3   Title Berg Balance >/= 36/56 to indicate lower fall risk    Time 12    Period Weeks    Status On-going    Target Date 01/06/21      PT LONG TERM GOAL #4   Title Patient ambulates 300' with LRAD & prosthesis modified independent.    Time 12    Period Weeks    Status On-going    Target Date 01/06/21      PT LONG TERM GOAL #5   Title Patient negotiates ramps, curbs & stairs with LRAD & prosthesis modified independent.    Time 12    Period Weeks    Status On-going    Target Date 01/06/21                   Plan - 01/03/21 1008     Clinical Impression Statement Patient improved balance but requires single UE support for balance. Her basic Berg Balance score improved from 6/56 to 11/56 but still indicates high fall risk & dependency in standing ADLs.  The tasks of Berg balance tested with locked rollator walker support of 41/56 indicates improved balance with single UE  support.    Personal Factors and Comorbidities Age;Comorbidity 3+;Fitness;Time since onset of injury/illness/exacerbation;Transportation    Comorbidities right TTA, anxiety, CHF, IDDM, HTN, PVD, LE muscle cramps    Examination-Activity Limitations Lift;Locomotion Level;Squat;Stairs;Stand;Transfers    Examination-Participation Restrictions Community Activity;Meal Prep    Stability/Clinical Decision Making Evolving/Moderate complexity    Rehab Potential Good    PT Frequency 2x / week    PT Duration 12 weeks    PT Treatment/Interventions ADLs/Self Care Home Management;DME Instruction;Gait training;Stair training;Functional mobility training;Therapeutic activities;Therapeutic  exercise;Neuromuscular re-education;Patient/family education;Prosthetic Training;Scar mobilization;Passive range of motion    PT Next Visit Plan check remaining LTGs & recertfy 1/wk for 12 weeks with updated LTGs.    PT Home Exercise Plan sink HEP    Consulted and Agree with Plan of Care Patient             Patient will benefit from skilled therapeutic intervention in order to improve the following deficits and impairments:  Abnormal gait, Decreased activity tolerance, Decreased balance, Decreased endurance, Decreased knowledge of use of DME, Decreased mobility, Decreased skin integrity, Decreased strength, Increased edema, Impaired flexibility, Postural dysfunction, Prosthetic Dependency, Pain  Visit Diagnosis: Other abnormalities of gait and mobility  Unsteadiness on feet  Muscle weakness (generalized)  Abnormal posture  Pain in right lower leg  History of falling     Problem List Patient Active Problem List   Diagnosis Date Noted   Diabetic infection of right foot ([place]) 06/19/2020   Chronic osteomyelitis of left foot ([place]) 06/19/2020   HCAP (healthcare-associated pneumonia)    Elevated troponin level not due myocardial infarction 03/13/2020   GERD without esophagitis 03/13/2020   Anemia 03/13/2020    Thyroid goiter 03/13/2020   Acute respiratory failure with hypoxia (HCC) 03/13/2020   Type 2 diabetes mellitus with complication, without long-term current use of insulin ([place]) 03/13/2020   Elevated liver enzymes 03/13/2020   Acute on chronic congestive heart failure ([place]) 03/13/2020   Acute cardiogenic pulmonary edema ([place]) 03/12/2020   Diabetic ulcer of right foot ([place]) 03/12/2020   Metatarsalgia of left foot 03/14/2019   Amputated toe ([place]) 12/06/2018   Achilles tendon contracture, left 07/24/2016   Ulcer of left foot, limited to breakdown of skin ([place]) 06/01/2016   Cellulitis 10/27/2015   Severe peripheral arterial disease ([place]) 12/28/2014   Chronic diastolic heart failure ([place]) 12/30/2013   Edema 12/30/2013   Mixed hyperlipidemia 06/24/2013   Mitral valve disorders(424.0) 06/24/2013   Coronary artery disease involving native coronary artery of native heart    Essential hypertension    Diabetes mellitus with peripheral vascular disease ([place])    Other primary cardiomyopathies     Jamey Reas, PT, DPT 01/03/2021, 3:03 PM  [facility] Physical Therapy [address] [place], [place], [address] Phone: [phone]   Fax:  [phone]  Name: [name] MRN: [id number] Date of Birth: June 28, 1932

## 2021-01-05 ENCOUNTER — Ambulatory Visit (INDEPENDENT_AMBULATORY_CARE_PROVIDER_SITE_OTHER): Payer: HMO | Admitting: Physical Therapy

## 2021-01-05 ENCOUNTER — Other Ambulatory Visit: Payer: Self-pay

## 2021-01-05 ENCOUNTER — Encounter: Payer: Self-pay | Admitting: Physical Therapy

## 2021-01-05 DIAGNOSIS — R2681 Unsteadiness on feet: Secondary | ICD-10-CM

## 2021-01-05 DIAGNOSIS — M79661 Pain in right lower leg: Secondary | ICD-10-CM

## 2021-01-05 DIAGNOSIS — R2689 Other abnormalities of gait and mobility: Secondary | ICD-10-CM

## 2021-01-05 DIAGNOSIS — R293 Abnormal posture: Secondary | ICD-10-CM | POA: Diagnosis not present

## 2021-01-05 DIAGNOSIS — M6281 Muscle weakness (generalized): Secondary | ICD-10-CM | POA: Diagnosis not present

## 2021-01-05 NOTE — Therapy (Signed)
[facility] [address] [place], [place], [address] Phone: [phone]   Fax:  [phone]  Physical Therapy Treatment & Recertification / Progress Note  Patient Details  Name: [name] MRN: [id number] Date of Birth: 24-Aug-1931 Referring Provider (PT): Marya Amsler [place], [place]   Encounter Date: 01/05/2021  Progress Note Reporting Period 11/24/2020 to 01/05/2021  See note below for Objective Data and Assessment of Progress/Goals.       PT End of Session - 01/05/21 1014     Visit Number 16    Number of Visits 30    Date for PT Re-Evaluation 04/05/21    Authorization Type [employer]    Authorization Time Period $20 copay    PT Start Time 1003    PT Stop Time 1052    PT Time Calculation (min) 49 min    Equipment Utilized During Treatment Gait belt    Activity Tolerance Patient tolerated treatment well    Behavior During Therapy WFL for tasks assessed/performed             Past Medical History:  Diagnosis Date   Anxiety    Cellulitis 10/2015   LEFT FOOT   CHF (congestive heart failure) (HCC)    Complication of anesthesia    Coronary artery disease    Diabetes mellitus without complication (HCC)    insulin dependent   GERD (gastroesophageal reflux disease)    Hypertension    Hypothyroidism    Neuromuscular disorder (HCC)    muscle cramps to lower extremities   Other primary cardiomyopathies    Peripheral vascular disease (HCC)    PONV (postoperative nausea and vomiting)    Shortness of breath     Past Surgical History:  Procedure Laterality Date   ABDOMINAL AORTOGRAM N/A 09/03/2018   Procedure: ABDOMINAL AORTOGRAM;  Surgeon: Serafina Mitchell, MD;  Location: [place] [facility];  Service: Cardiovascular;  Laterality: N/A;   ABDOMINAL AORTOGRAM W/LOWER EXTREMITY N/A 04/16/2018   Procedure: ABDOMINAL AORTOGRAM W/LOWER EXTREMITY;  Surgeon: Serafina Mitchell, MD;  Location: [place] [facility];  Service:  Cardiovascular;  Laterality: N/A;  unilateral   ABDOMINAL AORTOGRAM W/LOWER EXTREMITY Bilateral 04/08/2020   Procedure: ABDOMINAL AORTOGRAM W/LOWER EXTREMITY;  Surgeon: Serafina Mitchell, MD;  Location: [place] [facility];  Service: Cardiovascular;  Laterality: Bilateral;   ABDOMINAL HYSTERECTOMY     AMPUTATION Left 12/03/2018   Procedure: Left 3rd ray amputation;  Surgeon: Wylene Simmer, MD;  Location: [place];  Service: Orthopedics;  Laterality: Left;  36mn   AMPUTATION Right 06/22/2020   Procedure: AMPUTATION BELOW KNEE;  Surgeon: HWylene Simmer MD;  [place] [facility]  Service: Orthopedics;  Laterality: Right;   CHOLECYSTECTOMY     CORONARY ANGIOPLASTY WITH STENT PLACEMENT  08/09/2011   DES  to mid circumflex   I & D EXTREMITY Left 10/29/2015   Procedure: Irrigation and Debridement Left Foot;  Surgeon: MNewt Minion MD;  [place] [facility]  Service: Orthopedics;  Laterality: Left;   LEFT HEART CATHETERIZATION WITH CORONARY ANGIOGRAM N/A 08/08/2012   Procedure: LEFT HEART CATHETERIZATION WITH CORONARY ANGIOGRAM;  Surgeon: JJettie Booze MD;  [facility];  Service: Cardiovascular;  Laterality: N/A;   PERIPHERAL VASCULAR ATHERECTOMY  04/16/2018   Procedure: PERIPHERAL VASCULAR ATHERECTOMY;  Surgeon: BSerafina Mitchell MD;  [facility];  Service: Cardiovascular;;  lt. Peroneal   PERIPHERAL VASCULAR ATHERECTOMY Right 04/08/2020   Procedure: PERIPHERAL VASCULAR ATHERECTOMY;  Surgeon: BSerafina Mitchell  MD;  Location: [place] [facility];  Service: Cardiovascular;  Laterality: Right;  SFA and Peroneal   PERIPHERAL VASCULAR BALLOON ANGIOPLASTY  04/16/2018   Procedure: PERIPHERAL VASCULAR BALLOON ANGIOPLASTY;  Surgeon: Serafina Mitchell, MD;  Location: [place] [facility];  Service: Cardiovascular;;  lt. sfa and AT   PERIPHERAL VASCULAR BALLOON ANGIOPLASTY Left 09/03/2018   Procedure: PERIPHERAL VASCULAR BALLOON ANGIOPLASTY;  Surgeon: Serafina Mitchell, MD;  Location:  [place] [facility];  Service: Cardiovascular;  Laterality: Left;  TP TRUNK   PERIPHERAL VASCULAR BALLOON ANGIOPLASTY Right 04/08/2020   Procedure: PERIPHERAL VASCULAR BALLOON ANGIOPLASTY;  Surgeon: Serafina Mitchell, MD;  Location: [place] [facility];  Service: Cardiovascular;  Laterality: Right;  SFA (DCB), Peroneal   PERIPHERAL VASCULAR CATHETERIZATION N/A 12/30/2014   Procedure: Lower Extremity Angiography;  Surgeon: Wellington Hampshire, MD;  Location: [place] [facility];  Service: Cardiovascular;  Laterality: N/A;   PERIPHERAL VASCULAR INTERVENTION Left 09/03/2018   Procedure: PERIPHERAL VASCULAR INTERVENTION;  Surgeon: Serafina Mitchell, MD;  Location: [place] [facility];  Service: Cardiovascular;  Laterality: Left;  SFA STENT    TENDON RELEASE Right 03/11/2020   Procedure: Heel Cord Lengthening;  Surgeon: Wylene Simmer, MD;  Location: [place];  Service: Orthopedics;  Laterality: Right;   THYROID SURGERY     radioactive iodine    TONSILLECTOMY     TRANSMETATARSAL AMPUTATION Right 03/11/2020   Procedure: Right foot transmetatarsal amputation;  Surgeon: Wylene Simmer, MD;  Location: [place];  Service: Orthopedics;  Laterality: Right;    There were no vitals filed for this visit.   Subjective Assessment - 01/05/21 1003     Subjective She is working on getting a bar stool.    Patient is accompained by: Family member   son, [name]   Pertinent History right TTA, anxiety, CHF, IDDM, HTN, PVD, LE muscle cramps,    Patient Stated Goals to use prosthesis in community & apt complex    Currently in Pain? No/denies    Pain Onset 1 to 4 weeks ago                [facility] PT Assessment - 01/05/21 1003       Assessment   Medical Diagnosis Z89.511 acquired absence of right leg below knee    Referring Provider (PT) Corky Sing, PA      Transfers   Sit to Stand 6: Modified independent (Device/Increase time);With upper extremity assist;With armrests;From  chair/3-in-1;Other (comment);4: Min assist   to locked rollator walker, MinA to stabilize if not touching.   Stand to Sit 6: Modified independent (Device/Increase time);4: Min assist;With upper extremity assist;With armrests;To chair/3-in-1;Other (comment)   from locked rollator walker, MinA to stabilize if not touching.   Comments --      Ambulation/Gait   Ambulation/Gait Assistance 5: Supervision   verbal cues   Ambulation Distance (Feet) 220 Feet   max tolerable distance   Ramp 5: Supervision   rollator walker & TTA prosthesis   Ramp Details (indicate cue type and reason) verbal cues on safety & technique    Curb 5: Supervision   rollator walker & TTA prosthesis   Curb Details (indicate cue type and reason) verbal cues on safety & technique      Berg Balance Test   Sit to Stand Needs minimal aid to stand or to stabilize   with RW =3   Standing Unsupported Unable to stand 30 seconds unassisted   with  RW =4   Sitting with Back Unsupported but Feet Supported on Floor or Stool Able to sit safely and securely 2 minutes    Stand to Sit Controls descent by using hands   with RW =4   Transfers Able to transfer safely, definite need of hands    Standing Unsupported with Eyes Closed Needs help to keep from falling   with RW =3   Standing Unsupported with Feet Together Needs help to attain position and unable to hold for 15 seconds   with RW =3   From Standing, Reach Forward with Outstretched Arm Loses balance while trying/requires external support   with RW =3   From Standing Position, Pick up Object from Floor Unable to try/needs assist to keep balance   with RW =3   From Standing Position, Turn to Look Behind Over each Shoulder Needs assist to keep from losing balance and falling   with RW =4   Turn 360 Degrees Needs assistance while turning   with RW =2   Standing Unsupported, Alternately Place Feet on Step/Stool Needs assistance to keep from falling or unable to try   with RW =3   Standing  Unsupported, One Foot in [employer] balance while stepping or standing   with RW =3   Standing on One Leg Unable to try or needs assist to prevent fall   with RW =3   Total Score 11    Berg comment: tasks of Berg with locked rollator walker support = 41/ /56                           [facility] Adult PT Treatment/Exercise - 01/05/21 1003       Transfers   Transfers Sit to Stand;Stand to Sit      Ambulation/Gait   Ambulation/Gait Yes    Assistive device Prosthesis;Rollator      Self-Care   ADL's using 24" bar stool for ADLs as modified stand & with no back or arm support facilitates trunk stability & endurance. Standing at sink with anterior pelvis touching counter & can use BUEs to wash hands or reach 7" for objects safely.  Seated on bar stool she was able rinse & reach to drain board. She was able to return demo ability to use these techniques for washing dishes.   Pt verbalized understanding.      Neuro Re-ed    Neuro Re-ed Details  --      Knee/Hip Exercises: Seated   Sit to Sand 1 set;10 reps;without UE support;Other (comment)   goal to not touch //bars to stabilze.     Prosthetics   Current prosthetic wear tolerance (days/week)  daily    Current prosthetic wear tolerance (#hours/day)  all awake hours    Current prosthetic weight-bearing tolerance (hours/day)  Pt tolerated standing for 5 min with partial weight on prosthesis with no anterior tibial pain    Edema pitting    Residual limb condition  wound almost healed with 32m dry scab. normal other conditions.    Education Provided --   see prosthetic care comments                     PT Short Term Goals - 01/05/21 1218       PT SHORT TERM GOAL #1   Title Patient verbalizes understanding of signs of sweating in prosthesis & management.    Time 4    Period Weeks  Status Revised    Target Date 02/03/21      PT SHORT TERM GOAL #2   Title Patient tolerates prosthesis >90% of awake hrsday  without skin issues or limb pain.    Time 4    Period Weeks    Status On-going    Target Date 02/03/21      PT SHORT TERM GOAL #3   Title Patient able to reach 10" & pick up items from floor safely with walker support.    Time 4    Period Weeks    Status Revised    Target Date 02/03/21      PT SHORT TERM GOAL #4   Title Patient ambulates 250' with RW & prosthesis with supervision.    Time 4    Period Weeks    Status On-going    Target Date 02/03/21      PT SHORT TERM GOAL #5   Title Patient negotiates ramps & curbs with RW & prosthesis with supervision    Time 4    Period Weeks    Status On-going    Target Date 02/03/21               PT Long Term Goals - 01/05/21 1222       PT LONG TERM GOAL #1   Title Patient demonstrates & verbalized understanding of prosthetic care to enable safe utilization of prosthesis.    Baseline 01/05/2021 Patient requires cues for problem solving & advanced prosthetic issues like sweat management.    Time 12    Period Weeks    Status Partially Met    Target Date 04/05/21      PT LONG TERM GOAL #2   Title Patient tolerates prosthesis wear >90% of awake hours without skin or limb pain issues.    Baseline 01/05/2021  Patient tolerates wear of prosthesis >90% of awake hours but has healing wound at distal tibia and intermittent pain ~2/10.    Time 12    Period Weeks    Status Not Met    Target Date 04/05/21      PT LONG TERM GOAL #3   Title Berg Balance >/= 36/56 to indicate lower fall risk    Baseline 01/05/2021 Merrilee Jansky Balance 11/56  Tasks of Berg Balance performed with locked rollaotr walker 41/56    Time 12    Period Weeks    Status Not Met      PT LONG TERM GOAL #4   Title Patient ambulates 300' with LRAD & prosthesis modified independent.    Baseline 01/05/2021  Patient ambulates up to 220' with rollator walker with cues for technique.    Time 12    Period Weeks    Status Not Met      PT LONG TERM GOAL #5   Title Patient  negotiates ramps, curbs & stairs with LRAD & prosthesis modified independent.    Baseline 01/05/2021  Patient requires minA & cues for safety to negotiate ramps & curbs with rollator walker & prosthesis.    Time 12    Period Weeks    Status Not Met               PT Long Term Goals - 01/05/21 1236       PT LONG TERM GOAL #1   Title Patient demonstrates & verbalized understanding of prosthetic care to enable safe utilization of prosthesis.    Time 12    Period Weeks    Status On-going  Target Date 04/05/21      PT LONG TERM GOAL #2   Title Patient tolerates prosthesis wear >90% of awake hours without skin or limb pain issues.    Time 12    Period Weeks    Status On-going    Target Date 04/05/21      PT LONG TERM GOAL #3   Title Tasks of Oceanographer with rollator walker >/= 48 / 56 to indicate lower fall risk    Time 12    Period Weeks    Status Revised    Target Date 04/05/21      PT LONG TERM GOAL #4   Title Patient ambulates 300' with rollator walker & prosthesis modified independent.    Time 12    Period Weeks    Status Revised    Target Date 04/05/21      PT LONG TERM GOAL #5   Title Patient negotiates ramps, curbs with rollator walker & prosthesis modified independent & stairs with rail with supervision.    Time 12    Period Weeks    Status Revised    Target Date 04/05/21                  Plan - 01/05/21 1015     Clinical Impression Statement Patient has made significant progress with function with her prosthesis.  She would benefit from additional PT to progress her safety & function further.  She missed 2-3 weeks of care due to illness & death of her husband.  See LTGs baseline section comments for current functional level.  PT worked on [employer] ADLs including using 24" bar stool which she improved.    Personal Factors and Comorbidities Age;Comorbidity 3+;Fitness;Time since onset of injury/illness/exacerbation;Transportation    Comorbidities  right TTA, anxiety, CHF, IDDM, HTN, PVD, LE muscle cramps    Examination-Activity Limitations Lift;Locomotion Level;Squat;Stairs;Stand;Transfers    Examination-Participation Restrictions Community Activity;Meal Prep    Stability/Clinical Decision Making Evolving/Moderate complexity    Rehab Potential Good    PT Frequency 1x / week   2x/wk for 1st week, then 1x/wk for 12 weeks   PT Duration Other (comment)   90 days (3 months)   PT Treatment/Interventions ADLs/Self Care Home Management;DME Instruction;Gait training;Stair training;Functional mobility training;Therapeutic activities;Therapeutic exercise;Neuromuscular re-education;Patient/family education;Prosthetic Training;Scar mobilization;Passive range of motion    PT Next Visit Plan Check on ADLs in kitchen, work on balance facilitating ankle/residual limb, hip & step strategies,  prosthetic gait with rollator walker.    PT Home Exercise Plan --    Consulted and Agree with Plan of Care Patient             Patient will benefit from skilled therapeutic intervention in order to improve the following deficits and impairments:  Abnormal gait, Decreased activity tolerance, Decreased balance, Decreased endurance, Decreased knowledge of use of DME, Decreased mobility, Decreased skin integrity, Decreased strength, Increased edema, Impaired flexibility, Postural dysfunction, Prosthetic Dependency, Pain  Visit Diagnosis: Unsteadiness on feet  Other abnormalities of gait and mobility  Muscle weakness (generalized)  Abnormal posture  Pain in right lower leg     Problem List Patient Active Problem List   Diagnosis Date Noted   Diabetic infection of right foot ([place]) 06/19/2020   Chronic osteomyelitis of left foot ([place]) 06/19/2020   HCAP (healthcare-associated pneumonia)    Elevated troponin level not due myocardial infarction 03/13/2020   GERD without esophagitis 03/13/2020   Anemia 03/13/2020   Thyroid goiter 03/13/2020   Acute  respiratory failure  with hypoxia ([place]) 03/13/2020   Type 2 diabetes mellitus with complication, without long-term current use of insulin ([place]) 03/13/2020   Elevated liver enzymes 03/13/2020   Acute on chronic congestive heart failure ([place]) 03/13/2020   Acute cardiogenic pulmonary edema ([place]) 03/12/2020   Diabetic ulcer of right foot ([place]) 03/12/2020   Metatarsalgia of left foot 03/14/2019   Amputated toe ([place]) 12/06/2018   Achilles tendon contracture, left 07/24/2016   Ulcer of left foot, limited to breakdown of skin ([place]) 06/01/2016   Cellulitis 10/27/2015   Severe peripheral arterial disease ([place]) 12/28/2014   Chronic diastolic heart failure ([place]) 12/30/2013   Edema 12/30/2013   Mixed hyperlipidemia 06/24/2013   Mitral valve disorders(424.0) 06/24/2013   Coronary artery disease involving native coronary artery of native heart    Essential hypertension    Diabetes mellitus with peripheral vascular disease ([place])    Other primary cardiomyopathies     Jamey Reas, PT, DPT 01/05/2021, 12:31 PM  [facility] [address] [place], [place], [address] Phone: [phone]   Fax:  [phone]  Name: [name] MRN: [id number] Date of Birth: [birth year]

## 2021-01-10 ENCOUNTER — Encounter: Payer: HMO | Admitting: Physical Therapy

## 2021-01-11 ENCOUNTER — Encounter: Payer: Self-pay | Admitting: Physical Therapy

## 2021-01-11 ENCOUNTER — Ambulatory Visit (INDEPENDENT_AMBULATORY_CARE_PROVIDER_SITE_OTHER): Payer: HMO | Admitting: Physical Therapy

## 2021-01-11 ENCOUNTER — Other Ambulatory Visit: Payer: Self-pay

## 2021-01-11 DIAGNOSIS — R293 Abnormal posture: Secondary | ICD-10-CM | POA: Diagnosis not present

## 2021-01-11 DIAGNOSIS — M79661 Pain in right lower leg: Secondary | ICD-10-CM

## 2021-01-11 DIAGNOSIS — M6281 Muscle weakness (generalized): Secondary | ICD-10-CM

## 2021-01-11 DIAGNOSIS — R2681 Unsteadiness on feet: Secondary | ICD-10-CM | POA: Diagnosis not present

## 2021-01-11 DIAGNOSIS — R2689 Other abnormalities of gait and mobility: Secondary | ICD-10-CM

## 2021-01-11 NOTE — Therapy (Signed)
[facility] Physical Therapy [address] [place], [place], [address] Phone: [phone]   Fax:  [phone]  Physical Therapy Treatment  Patient Details  Name: [name] MRN: [id number] Date of Birth: 09/12/1931 Referring Provider (PT): [name] [place], [place]   Encounter Date: 01/11/2021   PT End of Session - 01/11/21 1015     Visit Number 17    Number of Visits 30    Date for PT Re-Evaluation 04/05/21    Authorization Type [employer]    Authorization Time Period $20 copay    Progress Note Due on Visit 26    PT Start Time 1018    PT Stop Time 1100    PT Time Calculation (min) 42 min    Equipment Utilized During Treatment Gait belt    Activity Tolerance Patient tolerated treatment well    Behavior During Therapy WFL for tasks assessed/performed             Past Medical History:  Diagnosis Date   Anxiety    Cellulitis 10/2015   LEFT FOOT   CHF (congestive heart failure) ([place])    Complication of anesthesia    Coronary artery disease    Diabetes mellitus without complication (HCC)    insulin dependent   GERD (gastroesophageal reflux disease)    Hypertension    Hypothyroidism    Neuromuscular disorder (HCC)    muscle cramps to lower extremities   Other primary cardiomyopathies    Peripheral vascular disease (HCC)    PONV (postoperative nausea and vomiting)    Shortness of breath     Past Surgical History:  Procedure Laterality Date   ABDOMINAL AORTOGRAM N/A 09/03/2018   Procedure: ABDOMINAL AORTOGRAM;  Surgeon: Serafina Mitchell, MD;  Location: [place] [facility];  Service: Cardiovascular;  Laterality: N/A;   ABDOMINAL AORTOGRAM W/LOWER EXTREMITY N/A 04/16/2018   Procedure: ABDOMINAL AORTOGRAM W/LOWER EXTREMITY;  Surgeon: Serafina Mitchell, MD;  Location: [place] [facility];  Service: Cardiovascular;  Laterality: N/A;  unilateral   ABDOMINAL AORTOGRAM W/LOWER EXTREMITY Bilateral 04/08/2020   Procedure: ABDOMINAL AORTOGRAM  W/LOWER EXTREMITY;  Surgeon: Serafina Mitchell, MD;  Location: [place] [facility];  Service: Cardiovascular;  Laterality: Bilateral;   ABDOMINAL HYSTERECTOMY     AMPUTATION Left 12/03/2018   Procedure: Left 3rd ray amputation;  Surgeon: Wylene Simmer, MD;  Location: [place];  Service: Orthopedics;  Laterality: Left;  69min   AMPUTATION Right 06/22/2020   Procedure: AMPUTATION BELOW KNEE;  Surgeon: Wylene Simmer, MD;  Location: [place];  Service: Orthopedics;  Laterality: Right;   CHOLECYSTECTOMY     CORONARY ANGIOPLASTY WITH STENT PLACEMENT  08/09/2011   DES  to mid circumflex   I & D EXTREMITY Left 10/29/2015   Procedure: Irrigation and Debridement Left Foot;  Surgeon: Newt Minion, MD;  Location: [place];  Service: Orthopedics;  Laterality: Left;   LEFT HEART CATHETERIZATION WITH CORONARY ANGIOGRAM N/A 08/08/2012   Procedure: LEFT HEART CATHETERIZATION WITH CORONARY ANGIOGRAM;  Surgeon: [name], MD;  Location: [facility];  Service: Cardiovascular;  Laterality: N/A;   PERIPHERAL VASCULAR ATHERECTOMY  04/16/2018   Procedure: PERIPHERAL VASCULAR ATHERECTOMY;  Surgeon: Serafina Mitchell, MD;  Location: [place] [facility];  Service: Cardiovascular;;  lt. Peroneal   PERIPHERAL VASCULAR ATHERECTOMY Right 04/08/2020   Procedure: PERIPHERAL VASCULAR ATHERECTOMY;  Surgeon: Serafina Mitchell, MD;  Location: [place] [facility];  Service: Cardiovascular;  Laterality: Right;  SFA and Peroneal  PERIPHERAL VASCULAR BALLOON ANGIOPLASTY  04/16/2018   Procedure: PERIPHERAL VASCULAR BALLOON ANGIOPLASTY;  Surgeon: Serafina Mitchell, MD;  Location: [place] [facility];  Service: Cardiovascular;;  lt. sfa and AT   PERIPHERAL VASCULAR BALLOON ANGIOPLASTY Left 09/03/2018   Procedure: PERIPHERAL VASCULAR BALLOON ANGIOPLASTY;  Surgeon: Serafina Mitchell, MD;  Location: [place] [facility];  Service: Cardiovascular;  Laterality: Left;  TP TRUNK   PERIPHERAL VASCULAR BALLOON ANGIOPLASTY Right 04/08/2020    Procedure: PERIPHERAL VASCULAR BALLOON ANGIOPLASTY;  Surgeon: Serafina Mitchell, MD;  Location: [place] [facility];  Service: Cardiovascular;  Laterality: Right;  SFA (DCB), Peroneal   PERIPHERAL VASCULAR CATHETERIZATION N/A 12/30/2014   Procedure: Lower Extremity Angiography;  Surgeon: Wellington Hampshire, MD;  Location: [place] [facility];  Service: Cardiovascular;  Laterality: N/A;   PERIPHERAL VASCULAR INTERVENTION Left 09/03/2018   Procedure: PERIPHERAL VASCULAR INTERVENTION;  Surgeon: Serafina Mitchell, MD;  Location: [place] [facility];  Service: Cardiovascular;  Laterality: Left;  SFA STENT    TENDON RELEASE Right 03/11/2020   Procedure: Heel Cord Lengthening;  Surgeon: Wylene Simmer, MD;  Location: [place];  Service: Orthopedics;  Laterality: Right;   THYROID SURGERY     radioactive iodine    TONSILLECTOMY     TRANSMETATARSAL AMPUTATION Right 03/11/2020   Procedure: Right foot transmetatarsal amputation;  Surgeon: Wylene Simmer, MD;  Location: [place];  Service: Orthopedics;  Laterality: Right;    There were no vitals filed for this visit.   Subjective Assessment - 01/11/21 1015     Subjective She tried ADLs using PT recommended and went well. She walked in [employer].    Patient is accompained by: Family member   son, [name]   Pertinent History right TTA, anxiety, CHF, IDDM, HTN, PVD, LE muscle cramps,    Patient Stated Goals to use prosthesis in community & apt complex    Currently in Pain? No/denies    Pain Onset 1 to 4 weeks ago                               [facility] Adult PT Treatment/Exercise - 01/11/21 1015       Transfers   Transfers Sit to Stand;Stand to Sit    Sit to Stand 6: Modified independent (Device/Increase time);With upper extremity assist;With armrests;From chair/3-in-1;Other (comment);4: Min assist   to locked rollator walker, MinA to stabilize if not touching.   Stand to Sit 6: Modified independent (Device/Increase  time);4: Min assist;With upper extremity assist;With armrests;To chair/3-in-1;Other (comment)   from locked rollator walker, MinA to stabilize if not touching.     Ambulation/Gait   Ambulation/Gait Yes    Ambulation/Gait Assistance 5: Supervision   verbal cues   Ambulation Distance (Feet) --    Assistive device Prosthesis;Rollator    Stairs Assistance Details (indicate cue type and reason) PT demo & verbal cues on sidestepping with BUEs on single rail if visiting someone with stairs to enter home.  Pt verbalized general understanding but did not want to try today.    Ramp --    Curb --      High Level Balance   High Level Balance Activities Side stepping;Backward walking   //bars   High Level Balance Comments in //bars with verbal, tactile & visual (mirror) for feedback      Self-Care   ADL's --      Knee/Hip Exercises: Standing   Forward Step  Up Both;1 set;5 reps;Hand Hold: 2;Step Height: 4"    Forward Step Up Limitations demo & verbal cues on technique    Step Down Both;1 set;5 reps;Hand Hold: 2;Step Height: 4"    Step Down Limitations demo & verbal cues on technique    Rocker Board 1 minute   //bars, square board w/2 pivot points,  ant/level/post & right/level/left   Rocker Board Limitations tactile  & verbal cues on technique.      Knee/Hip Exercises: Seated   Sit to Sand 2 sets;5 reps;without UE support;Other (comment)   from 24" bar stool, goal to not touch //bars to stabilze.     Prosthetics   Current prosthetic wear tolerance (days/week)  daily    Current prosthetic wear tolerance (#hours/day)  all awake hours    Current prosthetic weight-bearing tolerance (hours/day)  Pt tolerated standing for 5 min with partial weight on prosthesis with no anterior tibial pain    Edema pitting    Residual limb condition  wound almost healed with 94mm dry scab. normal other conditions.    Education Provided --   see prosthetic care comments                Balance Exercises -  01/11/21 1018       Balance Exercises: Standing   Standing Eyes Opened Wide (BOA);Head turns;5 reps;Solid surface;Foam/compliant surface;10 secs   right/left, up/down & diagonal, solid surface head turns,  static on foam EO 10sec 3 reps   Standing Eyes Opened Limitations MinA / tactile, verbal & visual cues on balance reactions    Standing Eyes Closed Wide (BOA);Solid surface;3 reps;10 secs    Standing Eyes Closed Limitations MinA / tactile, verbal & visual cues on balance reactions                 PT Short Term Goals - 01/05/21 1218       PT SHORT TERM GOAL #1   Title Patient verbalizes understanding of signs of sweating in prosthesis & management.    Time 4    Period Weeks    Status Revised    Target Date 02/03/21      PT SHORT TERM GOAL #2   Title Patient tolerates prosthesis >90% of awake hrsday without skin issues or limb pain.    Time 4    Period Weeks    Status On-going    Target Date 02/03/21      PT SHORT TERM GOAL #3   Title Patient able to reach 10" & pick up items from floor safely with walker support.    Time 4    Period Weeks    Status Revised    Target Date 02/03/21      PT SHORT TERM GOAL #4   Title Patient ambulates 250' with RW & prosthesis with supervision.    Time 4    Period Weeks    Status On-going    Target Date 02/03/21      PT SHORT TERM GOAL #5   Title Patient negotiates ramps & curbs with RW & prosthesis with supervision    Time 4    Period Weeks    Status On-going    Target Date 02/03/21               PT Long Term Goals - 01/05/21 1236       PT LONG TERM GOAL #1   Title Patient demonstrates & verbalized understanding of prosthetic care to enable safe utilization of prosthesis.  Time 12    Period Weeks    Status On-going    Target Date 04/05/21      PT LONG TERM GOAL #2   Title Patient tolerates prosthesis wear >90% of awake hours without skin or limb pain issues.    Time 12    Period Weeks    Status On-going     Target Date 04/05/21      PT LONG TERM GOAL #3   Title Tasks of Oceanographer with rollator walker >/= 48 / 56 to indicate lower fall risk    Time 12    Period Weeks    Status Revised    Target Date 04/05/21      PT LONG TERM GOAL #4   Title Patient ambulates 300' with rollator walker & prosthesis modified independent.    Time 12    Period Weeks    Status Revised    Target Date 04/05/21      PT LONG TERM GOAL #5   Title Patient negotiates ramps, curbs with rollator walker & prosthesis modified independent & stairs with rail with supervision.    Time 12    Period Weeks    Status Revised    Target Date 04/05/21                   Plan - 01/11/21 1016     Clinical Impression Statement PT session focused on balance reactions faciitating ankle/residual limb & hip strategies which are slowly improving.    Personal Factors and Comorbidities Age;Comorbidity 3+;Fitness;Time since onset of injury/illness/exacerbation;Transportation    Comorbidities right TTA, anxiety, CHF, IDDM, HTN, PVD, LE muscle cramps    Examination-Activity Limitations Lift;Locomotion Level;Squat;Stairs;Stand;Transfers    Examination-Participation Restrictions Community Activity;Meal Prep    Stability/Clinical Decision Making Evolving/Moderate complexity    Rehab Potential Good    PT Frequency 1x / week   2x/wk for 1st week, then 1x/wk for 12 weeks   PT Duration Other (comment)   90 days (3 months)   PT Treatment/Interventions ADLs/Self Care Home Management;DME Instruction;Gait training;Stair training;Functional mobility training;Therapeutic activities;Therapeutic exercise;Neuromuscular re-education;Patient/family education;Prosthetic Training;Scar mobilization;Passive range of motion    PT Next Visit Plan continue work on balance facilitating ankle/residual limb, hip & step strategies,  prosthetic gait with rollator walker.    Consulted and Agree with Plan of Care Patient             Patient will  benefit from skilled therapeutic intervention in order to improve the following deficits and impairments:  Abnormal gait, Decreased activity tolerance, Decreased balance, Decreased endurance, Decreased knowledge of use of DME, Decreased mobility, Decreased skin integrity, Decreased strength, Increased edema, Impaired flexibility, Postural dysfunction, Prosthetic Dependency, Pain  Visit Diagnosis: Unsteadiness on feet  Other abnormalities of gait and mobility  Muscle weakness (generalized)  Abnormal posture  Pain in right lower leg     Problem List Patient Active Problem List   Diagnosis Date Noted   Diabetic infection of right foot ([place]) 06/19/2020   Chronic osteomyelitis of left foot ([place]) 06/19/2020   HCAP (healthcare-associated pneumonia)    Elevated troponin level not due myocardial infarction 03/13/2020   GERD without esophagitis 03/13/2020   Anemia 03/13/2020   Thyroid goiter 03/13/2020   Acute respiratory failure with hypoxia ([place]) 03/13/2020   Type 2 diabetes mellitus with complication, without long-term current use of insulin ([place]) 03/13/2020   Elevated liver enzymes 03/13/2020   Acute on chronic congestive heart failure ([place]) 03/13/2020   Acute cardiogenic pulmonary edema ([place])  03/12/2020   Diabetic ulcer of right foot ([place]) 03/12/2020   Metatarsalgia of left foot 03/14/2019   Amputated toe ([place]) 12/06/2018   Achilles tendon contracture, left 07/24/2016   Ulcer of left foot, limited to breakdown of skin ([place]) 06/01/2016   Cellulitis 10/27/2015   Severe peripheral arterial disease ([place]) 12/28/2014   Chronic diastolic heart failure ([place]) 12/30/2013   Edema 12/30/2013   Mixed hyperlipidemia 06/24/2013   Mitral valve disorders(424.0) 06/24/2013   Coronary artery disease involving native coronary artery of native heart    Essential hypertension    Diabetes mellitus with peripheral vascular disease ([place])    Other primary cardiomyopathies     Jamey Reas, PT,  DPT 01/11/2021, 11:26 AM  [facility] Physical Therapy [address] [place], [place], [address] Phone: [phone]   Fax:  [phone]  Name: [name] MRN: [id number] Date of Birth: 10-30-1931

## 2021-01-12 ENCOUNTER — Encounter: Payer: HMO | Admitting: Physical Therapy

## 2021-01-18 ENCOUNTER — Encounter: Payer: HMO | Admitting: Physical Therapy

## 2021-01-19 ENCOUNTER — Encounter: Payer: HMO | Admitting: Physical Therapy

## 2021-01-20 ENCOUNTER — Encounter: Payer: HMO | Admitting: Physical Therapy

## 2021-01-24 ENCOUNTER — Encounter: Payer: HMO | Admitting: Physical Therapy

## 2021-01-26 ENCOUNTER — Encounter: Payer: HMO | Admitting: Physical Therapy

## 2021-01-31 ENCOUNTER — Encounter: Payer: HMO | Admitting: Physical Therapy

## 2021-02-02 ENCOUNTER — Encounter: Payer: HMO | Admitting: Physical Therapy

## 2021-02-07 ENCOUNTER — Other Ambulatory Visit: Payer: Self-pay

## 2021-02-07 ENCOUNTER — Encounter: Payer: HMO | Admitting: Physical Therapy

## 2021-02-07 ENCOUNTER — Ambulatory Visit (INDEPENDENT_AMBULATORY_CARE_PROVIDER_SITE_OTHER)
Admission: RE | Admit: 2021-02-07 | Discharge: 2021-02-07 | Disposition: A | Discharge status: Home / Self Care | Payer: HMO | Source: Ambulatory Visit | Attending: Surgery | Admitting: Surgery

## 2021-02-07 ENCOUNTER — Ambulatory Visit (HOSPITAL_COMMUNITY)
Admission: RE | Admit: 2021-02-07 | Discharge: 2021-02-07 | Disposition: A | Discharge status: Home / Self Care | Payer: HMO | Source: Ambulatory Visit | Attending: Surgery | Admitting: Surgery

## 2021-02-07 ENCOUNTER — Ambulatory Visit: Payer: HMO | Admitting: Physician Assistant

## 2021-02-07 VITALS — BP 139/64 | HR 71 | Temp 98.6°F | Resp 20 | Ht 68.0 in | Wt 175.7 lb

## 2021-02-07 DIAGNOSIS — I779 Disorder of arteries and arterioles, unspecified: Secondary | ICD-10-CM

## 2021-02-07 DIAGNOSIS — E1151 Type 2 diabetes mellitus with diabetic peripheral angiopathy without gangrene: Secondary | ICD-10-CM

## 2021-02-07 NOTE — Progress Notes (Signed)
Peripheral Arterial Disease Follow-Up   VASCULAR SURGERY ASSESSMENT & PLAN:   [name] is a 87 y.o. female who is status post atherectomy and angioplasty of an 80% ostial left peroneal artery stenosis and drug-eluting balloon angioplasty of  tandem 60-70% left superficial femoral artery lesions.  This was performed on 04/16/2018 for a wound which has been treated by both Dr. Sharol Given and podiatry.  The wound healed.  She developed a new wound and a repeat angiogram was performed on 09-03-2018 by Dr. Trula Slade. He stented her left SFA and repeated PTA of her peroneal. She is status post left 3d ray amputation (12-03-2019) for diabetic ulcer.S/p right BKA in December, 2021  Stable peripheral arterial disease.  In-stent stenosis estimated at 1-49%. Her left ABI and toe pressure are preserved.  Patient is without claudication or rest pain. Continue optimal medical management of diabetes, hypertension and follow-up with primary care physician. Encouraged continued complete smoking cessation. Continue the following medications: statin, Plavix, aspirin Follow-up in 9 months with duplex and ABI  SUBJECTIVE:   The patient denies lower extremity pain with exercise or rest pain.  Denies skin loss or ulceration. She now has her right lower extremity prosthesis and is ambulating with a rolling walker.  She continues physical therapy.  PHYSICAL EXAM:   Vitals:   02/07/21 1110  BP: 139/64  Pulse: 71  Resp: 20  Temp: 98.6 F (37 C)  TempSrc: Temporal  SpO2: 98%  Weight: 175 lb 11.2 oz (79.7 kg)  Height: '5\' 8"'$  (1.727 m)    General appearance: Well-developed, well-nourished in no apparent distress Neurologic: Alert and oriented x 4. Cardiovascular: Heart rate and rhythm are regular.   Abdomen: No palpable pulsatile mass. Extremities: Skin intact.  Both feet are warm and well perfused.  Motor function and sensation intact   NON-INVASIVE VASCULAR STUDIES   02/07/2021 ABI Findings:    +---------+------------------+-----+--------+-------+  Left     Lt Pressure (mmHg)IndexWaveformComment  +---------+------------------+-----+--------+-------+  Brachial 132                                     +---------+------------------+-----+--------+-------+  ATA      147               1.11 biphasic         +---------+------------------+-----+--------+-------+  PTA      130               0.98 biphasic         +---------+------------------+-----+--------+-------+  Great Toe83                0.63 Abnormal         +---------+------------------+-----+--------+-------+   +-------+-----------+-----------+------------+------------+  ABI/TBIToday's ABIToday's TBIPrevious ABIPrevious TBI  +-------+-----------+-----------+------------+------------+  Left   1.11       0.63       0.9         0.49          +-------+-----------+-----------+------------+------------+  LLE arterial duplex: LEFT       PSV cm/sRatioStenosisWaveformComments  +-----------+--------+-----+--------+--------+--------+  CFA Distal 139                  biphasic          +-----------+--------+-----+--------+--------+--------+  DFA        190                  biphasic          +-----------+--------+-----+--------+--------+--------+  SFA Prox   164                  biphasic          +-----------+--------+-----+--------+--------+--------+  SFA Distal 161                  biphasic          +-----------+--------+-----+--------+--------+--------+  POP Prox   65                   biphasic          +-----------+--------+-----+--------+--------+--------+  POP Distal 48                   biphasic          +-----------+--------+-----+--------+--------+--------+  ATA Distal 115                  biphasic          +-----------+--------+-----+--------+--------+--------+  PTA Distal 81                   biphasic           +-----------+--------+-----+--------+--------+--------+  PERO Distal                     absent            +-----------+--------+-----+--------+--------+--------+      Left Stent(s):  +---------------+--------+--------------+--------+--------+  SFA            PSV cm/sStenosis      WaveformComments  +---------------+--------+--------------+--------+--------+  Prox to Stent  96                                      +---------------+--------+--------------+--------+--------+  Proximal Stent 102                                     +---------------+--------+--------------+--------+--------+  Mid Stent      134                                     +---------------+--------+--------------+--------+--------+  Distal Stent   205     1-49% stenosis                  +---------------+--------+--------------+--------+--------+  Distal to [id number]                                     +---------------+--------+--------------+--------+--------+       Summary:  Left: Patent SFA stent with 1-49% stenosis of the distal stent.        PROBLEM LIST:    The patient's past medical history, past surgical history, family history, social history, allergy list and medication list are reviewed.   CURRENT MEDS:    Current Outpatient Medications:    acetaminophen (TYLENOL) 325 MG tablet, Take 2 tablets (650 mg total) by mouth every 4 (four) hours as needed for headache or mild pain., Disp: , Rfl:    aspirin EC 81 MG EC tablet, Take 1 tablet (81 mg total) by mouth daily. Swallow whole. (Patient taking differently: Take 325 mg by mouth at bedtime. Swallow whole.), Disp: 30 tablet, Rfl: 11   atorvastatin (LIPITOR)  20 MG tablet, Take 1 tablet (20 mg total) by mouth daily. Please keep upcoming appt in July with Dr. Irish Lack before anymore refills. Thank you (Patient taking differently: Take 20 mg by mouth every evening.), Disp: 90 tablet, Rfl: 0   b complex vitamins  capsule, Take 1 capsule by mouth daily., Disp: , Rfl:    BD PEN NEEDLE NANO 2ND GEN 32G X 4 MM MISC, SMARTSIG:Injection Daily, Disp: , Rfl:    Biotin w/ Vitamins C & E (HAIR SKIN & NAILS GUMMIES PO), Take 2 tablets by mouth daily. gummy, Disp: , Rfl:    Calcium-Magnesium-Zinc (CAL-MAG-ZINC PO), Take 1 tablet by mouth daily., Disp: , Rfl:    carvedilol (COREG) 25 MG tablet, Take 1 tablet (25 mg total) by mouth 2 (two) times daily with a meal. *Please call and schedule an appointment with Dr Fletcher Anon* (Patient taking differently: Take 25 mg by mouth at bedtime.), Disp: 60 tablet, Rfl: 0   cholecalciferol (VITAMIN D) 1000 units tablet, Take 2,000 Units by mouth daily. TAKE 6 DAYS PER WEEK BY MOUTH ONCE DAILY., Disp: , Rfl:    clopidogrel (PLAVIX) 75 MG tablet, TAKE 1 TABLET BY MOUTH ONCE DAILY (Patient taking differently: Take 75 mg by mouth at bedtime.), Disp: 30 tablet, Rfl: 11   Coenzyme Q10 (CO Q 10) 100 MG CAPS, Take 300 mg by mouth daily. , Disp: , Rfl:    Continuous Blood Gluc Sensor (FREESTYLE LIBRE 2 SENSOR) MISC, APPLY AS DIRECTED AND USE TO MONITOR BLOOD GLUCOSE CONTINUOUSLY. CHANGE EVERY 14 DAYS, Disp: , Rfl:    Continuous Blood Gluc Sensor (FREESTYLE LIBRE 2 SENSOR) MISC, APPLY AS DIRECTED AND USE TO MONITOR BLOOD GLUCOSE CONTINUOUSLY. CHANGE EVERY 14 DAYS, Disp: , Rfl:    docusate sodium (COLACE) 100 MG capsule, Take 1 capsule (100 mg total) by mouth 2 (two) times daily as needed for mild constipation., Disp: 10 capsule, Rfl: 0   ergocalciferol (VITAMIN D2) 1.25 MG (50000 UT) capsule, Take 50,000 Units by mouth every Friday. , Disp: , Rfl:    ezetimibe (ZETIA) 10 MG tablet, Take 10 mg by mouth daily., Disp: , Rfl:    FREESTYLE LITE test strip, 2 (two) times daily as needed., Disp: , Rfl:    furosemide (LASIX) 40 MG tablet, Take 1 tablet (40 mg total) by mouth daily., Disp: 90 tablet, Rfl: 3   glucose blood (FREESTYLE LITE) test strip, USE TO CHECK BLOOD GLUCOSE TWICE DAILY AND AS NEEDED, Disp:  , Rfl:    insulin aspart (NOVOLOG) 100 UNIT/ML injection, Inject 10 Units into the skin 3 (three) times daily with meals., Disp: 10 mL, Rfl: 11   insulin glargine (LANTUS) 100 UNIT/ML injection, Inject 0.35 mLs (35 Units total) into the skin every evening. (Patient taking differently: Inject 70 Units into the skin at bedtime.), Disp: 10 mL, Rfl: 11   insulin lispro (HUMALOG) 100 UNIT/ML injection, Inject 10-20 Units into the skin See admin instructions. Take 10 units at breakfast, 20 units at lunch and dinner, Disp: , Rfl:    iron polysaccharides (NIFEREX) 150 MG capsule, Take 150 mg by mouth daily. , Disp: , Rfl:    lisinopril (PRINIVIL,ZESTRIL) 20 MG tablet, Take 20 mg by mouth every evening. , Disp: , Rfl:    nitroGLYCERIN (NITROSTAT) 0.4 MG SL tablet, DISSOLVE 1 TABLET UNDER THE TONGUE EVERY 5 MINUTES AS NEEDED FOR CHEST PAIN, Disp: 25 tablet, Rfl: 6   pantoprazole (PROTONIX) 40 MG tablet, TAKE 1 TABLET BY MOUTH ONCE DAILY AT 6  AM (Patient taking differently: Take 40 mg by mouth daily.), Disp: 90 tablet, Rfl: 3   PARoxetine (PAXIL) 40 MG tablet, Take 40 mg by mouth at bedtime. , Disp: , Rfl:    polyethylene glycol (MIRALAX / GLYCOLAX) 17 g packet, Take 17 g by mouth daily as needed for mild constipation., Disp: 14 each, Rfl: 0   potassium chloride SA (KLOR-CON) 20 MEQ tablet, Take 1 tablet (20 mEq total) by mouth daily., Disp: 90 tablet, Rfl: 3   senna (SENOKOT) 8.6 MG TABS tablet, Take 1 tablet (8.6 mg total) by mouth daily as needed for mild constipation., Disp: 120 tablet, Rfl: 0   REVIEW OF SYSTEMS:   '[X]'$  denotes positive finding, '[ ]'$  denotes negative finding Cardiac  Comments:  Chest pain or chest pressure:    Shortness of breath upon exertion:    Short of breath when lying flat:    Irregular heart rhythm:        Vascular    Pain in calf, thigh, or hip brought on by ambulation:    Pain in feet at night that wakes you up from your sleep:     Blood clot in your veins:    Leg  swelling:         Pulmonary    Oxygen at home:    Productive cough:     Wheezing:         Neurologic    Sudden weakness in arms or legs:     Sudden numbness in arms or legs:     Sudden onset of difficulty speaking or slurred speech:    Temporary loss of vision in one eye:     Problems with dizziness:         Gastrointestinal    Blood in stool:     Vomited blood:         Genitourinary    Burning when urinating:     Blood in urine:        Psychiatric    Major depression:         Hematologic    Bleeding problems:    Problems with blood clotting too easily:        Skin    Rashes or ulcers:        Constitutional    Fever or chills:     Barbie Banner, PA-C  Office: [phone] 02/07/2021 Clinic MD: Trula Slade

## 2021-02-09 ENCOUNTER — Encounter: Payer: HMO | Admitting: Physical Therapy

## 2021-02-10 ENCOUNTER — Other Ambulatory Visit: Payer: Self-pay

## 2021-02-10 DIAGNOSIS — I779 Disorder of arteries and arterioles, unspecified: Secondary | ICD-10-CM

## 2021-02-16 ENCOUNTER — Encounter: Payer: HMO | Admitting: Physical Therapy

## 2021-02-23 ENCOUNTER — Encounter: Payer: HMO | Admitting: Physical Therapy

## 2021-02-28 ENCOUNTER — Encounter: Payer: Self-pay | Admitting: Physical Therapy

## 2021-02-28 NOTE — Therapy (Signed)
[facility] Physical Therapy [address] [place], [place], [address] Phone: [phone]   Fax:  [phone]  Patient Details  Name: [name] MRN: [id number] Date of Birth: 1931/10/31 Referring Provider:  Corky Sing, [place]  Encounter Date: 02/28/2021  PHYSICAL THERAPY DISCHARGE SUMMARY  Visits from Start of Care: 17  Current functional level related to goals / functional outcomes: See Recertification note on 01/05/2021   Remaining deficits: Patient was only seen one time after recertification on 0/93/2671.  She phoned to report transportation issues & declined our transportation system. She requested discharge.   Education / Equipment: Prosthetic [facility]& HEP  Patient agrees to discharge. Patient goals were not met. Patient is being discharged due to  transportation issues.   Jamey Reas, PT, DPT 02/28/2021, 3:19 PM  [facility] [address] [place], [place], [address] Phone: [phone]   Fax:  [phone]

## 2021-03-02 ENCOUNTER — Encounter: Payer: HMO | Admitting: Physical Therapy

## 2021-03-07 DIAGNOSIS — Z9861 Coronary angioplasty status: Secondary | ICD-10-CM | POA: Diagnosis not present

## 2021-03-07 DIAGNOSIS — E785 Hyperlipidemia, unspecified: Secondary | ICD-10-CM | POA: Diagnosis not present

## 2021-03-07 DIAGNOSIS — E1140 Type 2 diabetes mellitus with diabetic neuropathy, unspecified: Secondary | ICD-10-CM | POA: Diagnosis not present

## 2021-03-07 DIAGNOSIS — I2510 Atherosclerotic heart disease of native coronary artery without angina pectoris: Secondary | ICD-10-CM | POA: Diagnosis not present

## 2021-03-07 DIAGNOSIS — I130 Hypertensive heart and chronic kidney disease with heart failure and stage 1 through stage 4 chronic kidney disease, or unspecified chronic kidney disease: Secondary | ICD-10-CM | POA: Diagnosis not present

## 2021-03-07 DIAGNOSIS — N182 Chronic kidney disease, stage 2 (mild): Secondary | ICD-10-CM | POA: Diagnosis not present

## 2021-03-07 DIAGNOSIS — R0781 Pleurodynia: Secondary | ICD-10-CM | POA: Diagnosis not present

## 2021-03-07 DIAGNOSIS — I739 Peripheral vascular disease, unspecified: Secondary | ICD-10-CM | POA: Diagnosis not present

## 2021-03-07 DIAGNOSIS — Z794 Long term (current) use of insulin: Secondary | ICD-10-CM | POA: Diagnosis not present

## 2021-03-07 DIAGNOSIS — E042 Nontoxic multinodular goiter: Secondary | ICD-10-CM | POA: Diagnosis not present

## 2021-03-07 DIAGNOSIS — E1129 Type 2 diabetes mellitus with other diabetic kidney complication: Secondary | ICD-10-CM | POA: Diagnosis not present

## 2021-03-07 DIAGNOSIS — I5022 Chronic systolic (congestive) heart failure: Secondary | ICD-10-CM | POA: Diagnosis not present

## 2021-03-09 ENCOUNTER — Encounter: Payer: HMO | Admitting: Physical Therapy

## 2021-03-16 ENCOUNTER — Encounter: Payer: HMO | Admitting: Physical Therapy

## 2021-03-23 ENCOUNTER — Encounter: Payer: HMO | Admitting: Physical Therapy

## 2021-04-07 DIAGNOSIS — E1129 Type 2 diabetes mellitus with other diabetic kidney complication: Secondary | ICD-10-CM | POA: Diagnosis not present

## 2021-04-07 DIAGNOSIS — I2510 Atherosclerotic heart disease of native coronary artery without angina pectoris: Secondary | ICD-10-CM | POA: Diagnosis not present

## 2021-04-07 DIAGNOSIS — Z7189 Other specified counseling: Secondary | ICD-10-CM | POA: Diagnosis not present

## 2021-04-07 DIAGNOSIS — N182 Chronic kidney disease, stage 2 (mild): Secondary | ICD-10-CM | POA: Diagnosis not present

## 2021-04-07 DIAGNOSIS — I130 Hypertensive heart and chronic kidney disease with heart failure and stage 1 through stage 4 chronic kidney disease, or unspecified chronic kidney disease: Secondary | ICD-10-CM | POA: Diagnosis not present

## 2021-04-07 DIAGNOSIS — Z794 Long term (current) use of insulin: Secondary | ICD-10-CM | POA: Diagnosis not present

## 2021-04-14 NOTE — Telephone Encounter (Signed)
Error

## 2021-05-03 ENCOUNTER — Other Ambulatory Visit: Payer: Self-pay | Admitting: Interventional Cardiology

## 2021-05-15 ENCOUNTER — Other Ambulatory Visit: Payer: Self-pay | Admitting: Surgery

## 2021-07-19 DIAGNOSIS — M859 Disorder of bone density and structure, unspecified: Secondary | ICD-10-CM | POA: Diagnosis not present

## 2021-07-19 DIAGNOSIS — D649 Anemia, unspecified: Secondary | ICD-10-CM | POA: Diagnosis not present

## 2021-07-19 DIAGNOSIS — E785 Hyperlipidemia, unspecified: Secondary | ICD-10-CM | POA: Diagnosis not present

## 2021-07-19 DIAGNOSIS — E11621 Type 2 diabetes mellitus with foot ulcer: Secondary | ICD-10-CM | POA: Diagnosis not present

## 2021-07-19 DIAGNOSIS — E0590 Thyrotoxicosis, unspecified without thyrotoxic crisis or storm: Secondary | ICD-10-CM | POA: Diagnosis not present

## 2021-07-19 DIAGNOSIS — M8589 Other specified disorders of bone density and structure, multiple sites: Secondary | ICD-10-CM | POA: Diagnosis not present

## 2021-07-20 DIAGNOSIS — Z1339 Encounter for screening examination for other mental health and behavioral disorders: Secondary | ICD-10-CM | POA: Diagnosis not present

## 2021-07-20 DIAGNOSIS — I5022 Chronic systolic (congestive) heart failure: Secondary | ICD-10-CM | POA: Diagnosis not present

## 2021-07-20 DIAGNOSIS — I2510 Atherosclerotic heart disease of native coronary artery without angina pectoris: Secondary | ICD-10-CM | POA: Diagnosis not present

## 2021-07-20 DIAGNOSIS — I739 Peripheral vascular disease, unspecified: Secondary | ICD-10-CM | POA: Diagnosis not present

## 2021-07-20 DIAGNOSIS — Z1331 Encounter for screening for depression: Secondary | ICD-10-CM | POA: Diagnosis not present

## 2021-07-20 DIAGNOSIS — D649 Anemia, unspecified: Secondary | ICD-10-CM | POA: Diagnosis not present

## 2021-07-20 DIAGNOSIS — I130 Hypertensive heart and chronic kidney disease with heart failure and stage 1 through stage 4 chronic kidney disease, or unspecified chronic kidney disease: Secondary | ICD-10-CM | POA: Diagnosis not present

## 2021-07-20 DIAGNOSIS — N182 Chronic kidney disease, stage 2 (mild): Secondary | ICD-10-CM | POA: Diagnosis not present

## 2021-07-20 DIAGNOSIS — E1129 Type 2 diabetes mellitus with other diabetic kidney complication: Secondary | ICD-10-CM | POA: Diagnosis not present

## 2021-07-20 DIAGNOSIS — E1159 Type 2 diabetes mellitus with other circulatory complications: Secondary | ICD-10-CM | POA: Diagnosis not present

## 2021-07-20 DIAGNOSIS — E1140 Type 2 diabetes mellitus with diabetic neuropathy, unspecified: Secondary | ICD-10-CM | POA: Diagnosis not present

## 2021-07-20 DIAGNOSIS — Z9861 Coronary angioplasty status: Secondary | ICD-10-CM | POA: Diagnosis not present

## 2021-07-20 DIAGNOSIS — Z794 Long term (current) use of insulin: Secondary | ICD-10-CM | POA: Diagnosis not present

## 2021-07-20 DIAGNOSIS — E785 Hyperlipidemia, unspecified: Secondary | ICD-10-CM | POA: Diagnosis not present

## 2021-07-20 DIAGNOSIS — S88111A Complete traumatic amputation at level between knee and ankle, right lower leg, initial encounter: Secondary | ICD-10-CM | POA: Diagnosis not present

## 2021-07-30 ENCOUNTER — Other Ambulatory Visit: Payer: Self-pay | Admitting: Interventional Cardiology

## 2021-07-30 DIAGNOSIS — Z993 Dependence on wheelchair: Secondary | ICD-10-CM | POA: Diagnosis not present

## 2021-07-30 DIAGNOSIS — M8580 Other specified disorders of bone density and structure, unspecified site: Secondary | ICD-10-CM | POA: Diagnosis not present

## 2021-07-30 DIAGNOSIS — N182 Chronic kidney disease, stage 2 (mild): Secondary | ICD-10-CM | POA: Diagnosis not present

## 2021-07-30 DIAGNOSIS — I5022 Chronic systolic (congestive) heart failure: Secondary | ICD-10-CM | POA: Diagnosis not present

## 2021-07-30 DIAGNOSIS — F3342 Major depressive disorder, recurrent, in full remission: Secondary | ICD-10-CM | POA: Diagnosis not present

## 2021-07-30 DIAGNOSIS — E1122 Type 2 diabetes mellitus with diabetic chronic kidney disease: Secondary | ICD-10-CM | POA: Diagnosis not present

## 2021-07-30 DIAGNOSIS — I2510 Atherosclerotic heart disease of native coronary artery without angina pectoris: Secondary | ICD-10-CM | POA: Diagnosis not present

## 2021-07-30 DIAGNOSIS — Z7902 Long term (current) use of antithrombotics/antiplatelets: Secondary | ICD-10-CM | POA: Diagnosis not present

## 2021-07-30 DIAGNOSIS — Z90710 Acquired absence of both cervix and uterus: Secondary | ICD-10-CM | POA: Diagnosis not present

## 2021-07-30 DIAGNOSIS — E042 Nontoxic multinodular goiter: Secondary | ICD-10-CM | POA: Diagnosis not present

## 2021-07-30 DIAGNOSIS — E785 Hyperlipidemia, unspecified: Secondary | ICD-10-CM | POA: Diagnosis not present

## 2021-07-30 DIAGNOSIS — E1151 Type 2 diabetes mellitus with diabetic peripheral angiopathy without gangrene: Secondary | ICD-10-CM | POA: Diagnosis not present

## 2021-07-30 DIAGNOSIS — I429 Cardiomyopathy, unspecified: Secondary | ICD-10-CM | POA: Diagnosis not present

## 2021-07-30 DIAGNOSIS — I130 Hypertensive heart and chronic kidney disease with heart failure and stage 1 through stage 4 chronic kidney disease, or unspecified chronic kidney disease: Secondary | ICD-10-CM | POA: Diagnosis not present

## 2021-07-30 DIAGNOSIS — E1140 Type 2 diabetes mellitus with diabetic neuropathy, unspecified: Secondary | ICD-10-CM | POA: Diagnosis not present

## 2021-07-30 DIAGNOSIS — Z794 Long term (current) use of insulin: Secondary | ICD-10-CM | POA: Diagnosis not present

## 2021-07-30 DIAGNOSIS — I5030 Unspecified diastolic (congestive) heart failure: Secondary | ICD-10-CM | POA: Diagnosis not present

## 2021-07-30 DIAGNOSIS — T8789 Other complications of amputation stump: Secondary | ICD-10-CM | POA: Diagnosis not present

## 2021-07-30 DIAGNOSIS — D631 Anemia in chronic kidney disease: Secondary | ICD-10-CM | POA: Diagnosis not present

## 2021-07-30 DIAGNOSIS — I6521 Occlusion and stenosis of right carotid artery: Secondary | ICD-10-CM | POA: Diagnosis not present

## 2021-08-05 DIAGNOSIS — E1151 Type 2 diabetes mellitus with diabetic peripheral angiopathy without gangrene: Secondary | ICD-10-CM | POA: Diagnosis not present

## 2021-08-05 DIAGNOSIS — E1140 Type 2 diabetes mellitus with diabetic neuropathy, unspecified: Secondary | ICD-10-CM | POA: Diagnosis not present

## 2021-08-05 DIAGNOSIS — E1122 Type 2 diabetes mellitus with diabetic chronic kidney disease: Secondary | ICD-10-CM | POA: Diagnosis not present

## 2021-08-05 DIAGNOSIS — I130 Hypertensive heart and chronic kidney disease with heart failure and stage 1 through stage 4 chronic kidney disease, or unspecified chronic kidney disease: Secondary | ICD-10-CM | POA: Diagnosis not present

## 2021-08-05 DIAGNOSIS — I5022 Chronic systolic (congestive) heart failure: Secondary | ICD-10-CM | POA: Diagnosis not present

## 2021-08-05 DIAGNOSIS — Z7902 Long term (current) use of antithrombotics/antiplatelets: Secondary | ICD-10-CM | POA: Diagnosis not present

## 2021-08-05 DIAGNOSIS — M8580 Other specified disorders of bone density and structure, unspecified site: Secondary | ICD-10-CM | POA: Diagnosis not present

## 2021-08-05 DIAGNOSIS — Z794 Long term (current) use of insulin: Secondary | ICD-10-CM | POA: Diagnosis not present

## 2021-08-05 DIAGNOSIS — I429 Cardiomyopathy, unspecified: Secondary | ICD-10-CM | POA: Diagnosis not present

## 2021-08-05 DIAGNOSIS — F3342 Major depressive disorder, recurrent, in full remission: Secondary | ICD-10-CM | POA: Diagnosis not present

## 2021-08-05 DIAGNOSIS — T8789 Other complications of amputation stump: Secondary | ICD-10-CM | POA: Diagnosis not present

## 2021-08-05 DIAGNOSIS — E042 Nontoxic multinodular goiter: Secondary | ICD-10-CM | POA: Diagnosis not present

## 2021-08-05 DIAGNOSIS — I5030 Unspecified diastolic (congestive) heart failure: Secondary | ICD-10-CM | POA: Diagnosis not present

## 2021-08-05 DIAGNOSIS — Z90710 Acquired absence of both cervix and uterus: Secondary | ICD-10-CM | POA: Diagnosis not present

## 2021-08-05 DIAGNOSIS — I6521 Occlusion and stenosis of right carotid artery: Secondary | ICD-10-CM | POA: Diagnosis not present

## 2021-08-05 DIAGNOSIS — N182 Chronic kidney disease, stage 2 (mild): Secondary | ICD-10-CM | POA: Diagnosis not present

## 2021-08-05 DIAGNOSIS — E785 Hyperlipidemia, unspecified: Secondary | ICD-10-CM | POA: Diagnosis not present

## 2021-08-05 DIAGNOSIS — D631 Anemia in chronic kidney disease: Secondary | ICD-10-CM | POA: Diagnosis not present

## 2021-08-05 DIAGNOSIS — Z993 Dependence on wheelchair: Secondary | ICD-10-CM | POA: Diagnosis not present

## 2021-08-05 DIAGNOSIS — I2510 Atherosclerotic heart disease of native coronary artery without angina pectoris: Secondary | ICD-10-CM | POA: Diagnosis not present

## 2021-08-16 DIAGNOSIS — I429 Cardiomyopathy, unspecified: Secondary | ICD-10-CM | POA: Diagnosis not present

## 2021-08-16 DIAGNOSIS — I2510 Atherosclerotic heart disease of native coronary artery without angina pectoris: Secondary | ICD-10-CM | POA: Diagnosis not present

## 2021-08-16 DIAGNOSIS — Z993 Dependence on wheelchair: Secondary | ICD-10-CM | POA: Diagnosis not present

## 2021-08-16 DIAGNOSIS — I130 Hypertensive heart and chronic kidney disease with heart failure and stage 1 through stage 4 chronic kidney disease, or unspecified chronic kidney disease: Secondary | ICD-10-CM | POA: Diagnosis not present

## 2021-08-16 DIAGNOSIS — I5030 Unspecified diastolic (congestive) heart failure: Secondary | ICD-10-CM | POA: Diagnosis not present

## 2021-08-16 DIAGNOSIS — E1140 Type 2 diabetes mellitus with diabetic neuropathy, unspecified: Secondary | ICD-10-CM | POA: Diagnosis not present

## 2021-08-16 DIAGNOSIS — E1122 Type 2 diabetes mellitus with diabetic chronic kidney disease: Secondary | ICD-10-CM | POA: Diagnosis not present

## 2021-08-16 DIAGNOSIS — I5022 Chronic systolic (congestive) heart failure: Secondary | ICD-10-CM | POA: Diagnosis not present

## 2021-08-16 DIAGNOSIS — T8789 Other complications of amputation stump: Secondary | ICD-10-CM | POA: Diagnosis not present

## 2021-08-19 DIAGNOSIS — I6521 Occlusion and stenosis of right carotid artery: Secondary | ICD-10-CM | POA: Diagnosis not present

## 2021-08-19 DIAGNOSIS — Z993 Dependence on wheelchair: Secondary | ICD-10-CM | POA: Diagnosis not present

## 2021-08-19 DIAGNOSIS — I2510 Atherosclerotic heart disease of native coronary artery without angina pectoris: Secondary | ICD-10-CM | POA: Diagnosis not present

## 2021-08-19 DIAGNOSIS — E1140 Type 2 diabetes mellitus with diabetic neuropathy, unspecified: Secondary | ICD-10-CM | POA: Diagnosis not present

## 2021-08-19 DIAGNOSIS — D631 Anemia in chronic kidney disease: Secondary | ICD-10-CM | POA: Diagnosis not present

## 2021-08-19 DIAGNOSIS — Z7902 Long term (current) use of antithrombotics/antiplatelets: Secondary | ICD-10-CM | POA: Diagnosis not present

## 2021-08-19 DIAGNOSIS — N182 Chronic kidney disease, stage 2 (mild): Secondary | ICD-10-CM | POA: Diagnosis not present

## 2021-08-19 DIAGNOSIS — Z90710 Acquired absence of both cervix and uterus: Secondary | ICD-10-CM | POA: Diagnosis not present

## 2021-08-19 DIAGNOSIS — M8580 Other specified disorders of bone density and structure, unspecified site: Secondary | ICD-10-CM | POA: Diagnosis not present

## 2021-08-19 DIAGNOSIS — E1151 Type 2 diabetes mellitus with diabetic peripheral angiopathy without gangrene: Secondary | ICD-10-CM | POA: Diagnosis not present

## 2021-08-19 DIAGNOSIS — E1122 Type 2 diabetes mellitus with diabetic chronic kidney disease: Secondary | ICD-10-CM | POA: Diagnosis not present

## 2021-08-19 DIAGNOSIS — I429 Cardiomyopathy, unspecified: Secondary | ICD-10-CM | POA: Diagnosis not present

## 2021-08-19 DIAGNOSIS — E785 Hyperlipidemia, unspecified: Secondary | ICD-10-CM | POA: Diagnosis not present

## 2021-08-19 DIAGNOSIS — F3342 Major depressive disorder, recurrent, in full remission: Secondary | ICD-10-CM | POA: Diagnosis not present

## 2021-08-19 DIAGNOSIS — E042 Nontoxic multinodular goiter: Secondary | ICD-10-CM | POA: Diagnosis not present

## 2021-08-19 DIAGNOSIS — T8789 Other complications of amputation stump: Secondary | ICD-10-CM | POA: Diagnosis not present

## 2021-08-19 DIAGNOSIS — I5022 Chronic systolic (congestive) heart failure: Secondary | ICD-10-CM | POA: Diagnosis not present

## 2021-08-19 DIAGNOSIS — Z794 Long term (current) use of insulin: Secondary | ICD-10-CM | POA: Diagnosis not present

## 2021-08-19 DIAGNOSIS — I5030 Unspecified diastolic (congestive) heart failure: Secondary | ICD-10-CM | POA: Diagnosis not present

## 2021-08-19 DIAGNOSIS — I130 Hypertensive heart and chronic kidney disease with heart failure and stage 1 through stage 4 chronic kidney disease, or unspecified chronic kidney disease: Secondary | ICD-10-CM | POA: Diagnosis not present

## 2021-08-22 DIAGNOSIS — Z89511 Acquired absence of right leg below knee: Secondary | ICD-10-CM | POA: Diagnosis not present

## 2021-09-14 DIAGNOSIS — T8789 Other complications of amputation stump: Secondary | ICD-10-CM | POA: Diagnosis not present

## 2021-09-14 DIAGNOSIS — Z794 Long term (current) use of insulin: Secondary | ICD-10-CM | POA: Diagnosis not present

## 2021-09-14 DIAGNOSIS — E1151 Type 2 diabetes mellitus with diabetic peripheral angiopathy without gangrene: Secondary | ICD-10-CM | POA: Diagnosis not present

## 2021-09-14 DIAGNOSIS — I429 Cardiomyopathy, unspecified: Secondary | ICD-10-CM | POA: Diagnosis not present

## 2021-09-14 DIAGNOSIS — M8580 Other specified disorders of bone density and structure, unspecified site: Secondary | ICD-10-CM | POA: Diagnosis not present

## 2021-09-14 DIAGNOSIS — I5030 Unspecified diastolic (congestive) heart failure: Secondary | ICD-10-CM | POA: Diagnosis not present

## 2021-09-14 DIAGNOSIS — F3342 Major depressive disorder, recurrent, in full remission: Secondary | ICD-10-CM | POA: Diagnosis not present

## 2021-09-14 DIAGNOSIS — I5022 Chronic systolic (congestive) heart failure: Secondary | ICD-10-CM | POA: Diagnosis not present

## 2021-09-14 DIAGNOSIS — E1140 Type 2 diabetes mellitus with diabetic neuropathy, unspecified: Secondary | ICD-10-CM | POA: Diagnosis not present

## 2021-09-14 DIAGNOSIS — Z993 Dependence on wheelchair: Secondary | ICD-10-CM | POA: Diagnosis not present

## 2021-09-14 DIAGNOSIS — E785 Hyperlipidemia, unspecified: Secondary | ICD-10-CM | POA: Diagnosis not present

## 2021-09-14 DIAGNOSIS — I2510 Atherosclerotic heart disease of native coronary artery without angina pectoris: Secondary | ICD-10-CM | POA: Diagnosis not present

## 2021-09-14 DIAGNOSIS — E1129 Type 2 diabetes mellitus with other diabetic kidney complication: Secondary | ICD-10-CM | POA: Diagnosis not present

## 2021-09-14 DIAGNOSIS — E042 Nontoxic multinodular goiter: Secondary | ICD-10-CM | POA: Diagnosis not present

## 2021-09-14 DIAGNOSIS — E1122 Type 2 diabetes mellitus with diabetic chronic kidney disease: Secondary | ICD-10-CM | POA: Diagnosis not present

## 2021-09-14 DIAGNOSIS — Z7902 Long term (current) use of antithrombotics/antiplatelets: Secondary | ICD-10-CM | POA: Diagnosis not present

## 2021-09-14 DIAGNOSIS — Z90710 Acquired absence of both cervix and uterus: Secondary | ICD-10-CM | POA: Diagnosis not present

## 2021-09-14 DIAGNOSIS — I130 Hypertensive heart and chronic kidney disease with heart failure and stage 1 through stage 4 chronic kidney disease, or unspecified chronic kidney disease: Secondary | ICD-10-CM | POA: Diagnosis not present

## 2021-09-14 DIAGNOSIS — N182 Chronic kidney disease, stage 2 (mild): Secondary | ICD-10-CM | POA: Diagnosis not present

## 2021-09-14 DIAGNOSIS — D631 Anemia in chronic kidney disease: Secondary | ICD-10-CM | POA: Diagnosis not present

## 2021-09-14 DIAGNOSIS — I6521 Occlusion and stenosis of right carotid artery: Secondary | ICD-10-CM | POA: Diagnosis not present

## 2021-09-15 ENCOUNTER — Other Ambulatory Visit: Payer: Self-pay | Admitting: Vascular Surgery

## 2021-09-16 DIAGNOSIS — H35033 Hypertensive retinopathy, bilateral: Secondary | ICD-10-CM | POA: Diagnosis not present

## 2021-09-16 DIAGNOSIS — H43393 Other vitreous opacities, bilateral: Secondary | ICD-10-CM | POA: Diagnosis not present

## 2021-09-16 DIAGNOSIS — E119 Type 2 diabetes mellitus without complications: Secondary | ICD-10-CM | POA: Diagnosis not present

## 2021-09-16 DIAGNOSIS — H35373 Puckering of macula, bilateral: Secondary | ICD-10-CM | POA: Diagnosis not present

## 2021-09-16 DIAGNOSIS — H43813 Vitreous degeneration, bilateral: Secondary | ICD-10-CM | POA: Diagnosis not present

## 2021-09-16 DIAGNOSIS — H524 Presbyopia: Secondary | ICD-10-CM | POA: Diagnosis not present

## 2021-09-16 DIAGNOSIS — H5203 Hypermetropia, bilateral: Secondary | ICD-10-CM | POA: Diagnosis not present

## 2021-09-16 DIAGNOSIS — H52223 Regular astigmatism, bilateral: Secondary | ICD-10-CM | POA: Diagnosis not present

## 2021-09-16 DIAGNOSIS — H40013 Open angle with borderline findings, low risk, bilateral: Secondary | ICD-10-CM | POA: Diagnosis not present

## 2021-10-21 DIAGNOSIS — Z794 Long term (current) use of insulin: Secondary | ICD-10-CM | POA: Diagnosis not present

## 2021-10-21 DIAGNOSIS — E119 Type 2 diabetes mellitus without complications: Secondary | ICD-10-CM | POA: Diagnosis not present

## 2021-11-01 ENCOUNTER — Telehealth: Payer: Self-pay | Admitting: *Deleted

## 2021-11-01 NOTE — Telephone Encounter (Signed)
Received call from Christian (nurse at [employer]).  She will reach out to provider and call back ?

## 2021-11-01 NOTE — Telephone Encounter (Signed)
Received fax from [place] that patient was recently seen and it was noted she had persistent and stable Atrial fibrillation.  I called office to see if EKG was done and if it could be faxed to our office.  Patient was seen by Jerline Pain, NP.  Phone number is [phone].  Office will call our office back ?

## 2021-11-07 ENCOUNTER — Other Ambulatory Visit: Payer: Self-pay

## 2021-11-07 DIAGNOSIS — I779 Disorder of arteries and arterioles, unspecified: Secondary | ICD-10-CM

## 2021-11-14 NOTE — Telephone Encounter (Signed)
Left message at [facility] requesting call back.  I placed call to patient to schedule yearly follow up appointment.  Left message to call office.  ?

## 2021-11-17 ENCOUNTER — Ambulatory Visit (INDEPENDENT_AMBULATORY_CARE_PROVIDER_SITE_OTHER)
Admission: RE | Admit: 2021-11-17 | Discharge: 2021-11-17 | Disposition: A | Discharge status: Home / Self Care | Payer: HMO | Source: Ambulatory Visit | Attending: Physician Assistant | Admitting: Physician Assistant

## 2021-11-17 ENCOUNTER — Ambulatory Visit: Payer: HMO | Admitting: Physician Assistant

## 2021-11-17 ENCOUNTER — Ambulatory Visit (HOSPITAL_COMMUNITY)
Admission: RE | Admit: 2021-11-17 | Discharge: 2021-11-17 | Disposition: A | Discharge status: Home / Self Care | Payer: HMO | Source: Ambulatory Visit | Attending: Physician Assistant | Admitting: Physician Assistant

## 2021-11-17 VITALS — BP 138/70 | HR 68 | Temp 97.5°F | Resp 18 | Ht 67.0 in | Wt 182.6 lb

## 2021-11-17 DIAGNOSIS — I779 Disorder of arteries and arterioles, unspecified: Secondary | ICD-10-CM

## 2021-11-17 NOTE — Progress Notes (Signed)
Office Note     CC:  follow up Requesting Provider:  Ginger Organ., MD  HPI: [name] is a 88 y.o. (10-18-1931) female who presents for surveillance of PAD.  Surgical history is significant for several endovascular interventions of the right leg however patient ultimately ended up having a right below the knee amputation by Dr. Doran Durand on 06/22/2020.  She has also had several endovascular interventions of the left lower extremity including atherectomy and angioplasty of the left peroneal artery with angioplasty of the left SFA on 04/2018 due to a wound which subsequently healed.  She then developed a new wound and underwent repeat angiogram on 08/2018 where her left SFA was stented and balloon angioplasty of her peroneal artery by Dr. Trula Slade.  She had a left third toe ray amputation on 11/2019 by Dr. Doran Durand.  She is ambulatory with her prosthetic right leg and a walker.  She denies any new wounds of her left lower extremity.  She also denies any rest pain of her left leg.  She is on aspirin, Plavix, statin daily.  She denies tobacco use.   Past Medical History:  Diagnosis Date   Anxiety    Cellulitis 10/2015   LEFT FOOT   CHF (congestive heart failure) (HCC)    Complication of anesthesia    Coronary artery disease    Diabetes mellitus without complication (HCC)    insulin dependent   GERD (gastroesophageal reflux disease)    Hypertension    Hypothyroidism    Neuromuscular disorder (HCC)    muscle cramps to lower extremities   Other primary cardiomyopathies    Peripheral vascular disease (HCC)    PONV (postoperative nausea and vomiting)    Shortness of breath     Past Surgical History:  Procedure Laterality Date   ABDOMINAL AORTOGRAM N/A 09/03/2018   Procedure: ABDOMINAL AORTOGRAM;  Surgeon: Serafina Mitchell, MD;  Location: [place] [facility];  Service: Cardiovascular;  Laterality: N/A;   ABDOMINAL AORTOGRAM W/LOWER EXTREMITY N/A 04/16/2018   Procedure: ABDOMINAL AORTOGRAM  W/LOWER EXTREMITY;  Surgeon: Serafina Mitchell, MD;  Location: [place] [facility];  Service: Cardiovascular;  Laterality: N/A;  unilateral   ABDOMINAL AORTOGRAM W/LOWER EXTREMITY Bilateral 04/08/2020   Procedure: ABDOMINAL AORTOGRAM W/LOWER EXTREMITY;  Surgeon: Serafina Mitchell, MD;  Location: [place] [facility];  Service: Cardiovascular;  Laterality: Bilateral;   ABDOMINAL HYSTERECTOMY     AMPUTATION Left 12/03/2018   Procedure: Left 3rd ray amputation;  Surgeon: Wylene Simmer, MD;  Location: [place];  Service: Orthopedics;  Laterality: Left;  16mn   AMPUTATION Right 06/22/2020   Procedure: AMPUTATION BELOW KNEE;  Surgeon: HWylene Simmer MD;  [place]  Service: Orthopedics;  Laterality: Right;   CHOLECYSTECTOMY     CORONARY ANGIOPLASTY WITH STENT PLACEMENT  08/09/2011   DES  to mid circumflex   I & D EXTREMITY Left 10/29/2015   Procedure: Irrigation and Debridement Left Foot;  Surgeon: MNewt Minion MD;  [place]  Service: Orthopedics;  Laterality: Left;   LEFT HEART CATHETERIZATION WITH CORONARY ANGIOGRAM N/A 08/08/2012   Procedure: LEFT HEART CATHETERIZATION WITH CORONARY ANGIOGRAM;  Surgeon: JJettie Booze MD;  [facility];  Service: Cardiovascular;  Laterality: N/A;   PERIPHERAL VASCULAR ATHERECTOMY  04/16/2018   Procedure: PERIPHERAL VASCULAR ATHERECTOMY;  Surgeon: BSerafina Mitchell MD;  [facility];  Service: Cardiovascular;;  lt. Peroneal   PERIPHERAL VASCULAR ATHERECTOMY Right 04/08/2020   Procedure:  PERIPHERAL VASCULAR ATHERECTOMY;  Surgeon: Serafina Mitchell, MD;  Location: [place] [facility];  Service: Cardiovascular;  Laterality: Right;  SFA and Peroneal   PERIPHERAL VASCULAR BALLOON ANGIOPLASTY  04/16/2018   Procedure: PERIPHERAL VASCULAR BALLOON ANGIOPLASTY;  Surgeon: Serafina Mitchell, MD;  Location: [place] [facility];  Service: Cardiovascular;;  lt. sfa and AT   PERIPHERAL VASCULAR BALLOON ANGIOPLASTY Left 09/03/2018    Procedure: PERIPHERAL VASCULAR BALLOON ANGIOPLASTY;  Surgeon: Serafina Mitchell, MD;  Location: [place] [facility];  Service: Cardiovascular;  Laterality: Left;  TP TRUNK   PERIPHERAL VASCULAR BALLOON ANGIOPLASTY Right 04/08/2020   Procedure: PERIPHERAL VASCULAR BALLOON ANGIOPLASTY;  Surgeon: Serafina Mitchell, MD;  Location: [place] [facility];  Service: Cardiovascular;  Laterality: Right;  SFA (DCB), Peroneal   PERIPHERAL VASCULAR CATHETERIZATION N/A 12/30/2014   Procedure: Lower Extremity Angiography;  Surgeon: Wellington Hampshire, MD;  Location: [place] [facility];  Service: Cardiovascular;  Laterality: N/A;   PERIPHERAL VASCULAR INTERVENTION Left 09/03/2018   Procedure: PERIPHERAL VASCULAR INTERVENTION;  Surgeon: Serafina Mitchell, MD;  Location: [place] [facility];  Service: Cardiovascular;  Laterality: Left;  SFA STENT    TENDON RELEASE Right 03/11/2020   Procedure: Heel Cord Lengthening;  Surgeon: Wylene Simmer, MD;  Location: [place];  Service: Orthopedics;  Laterality: Right;   THYROID SURGERY     radioactive iodine    TONSILLECTOMY     TRANSMETATARSAL AMPUTATION Right 03/11/2020   Procedure: Right foot transmetatarsal amputation;  Surgeon: Wylene Simmer, MD;  Location: [place];  Service: Orthopedics;  Laterality: Right;    Social History   Socioeconomic History   Marital status: Married    Spouse name: Not on file   Number of children: Not on file   Years of education: Not on file   Highest education level: Not on file  Occupational History   Occupation: retired  Tobacco Use   Smoking status: Former    Types: Cigarettes    Quit date: 11/28/1960    Years since quitting: 61.0   Smokeless tobacco: Never  Vaping Use   Vaping Use: Never used  Substance and Sexual Activity   Alcohol use: Yes    Alcohol/week: 0.0 standard drinks    Comment: rare   Drug use: No   Sexual activity: Not on file  Other Topics Concern   Not on file  Social History  Narrative   Not on file   Social Determinants of Health   Financial Resource Strain: Not on file  Food Insecurity: Not on file  Transportation Needs: Not on file  Physical Activity: Not on file  Stress: Not on file  Social Connections: Not on file  Intimate Partner Violence: Not on file    Family History  Problem Relation Age of Onset   Heart disease Father    Heart attack Father    Diabetes Sister    Heart disease Son        before age 35   Heart attack 2        88yrold   Sudden death Grandchild    Hypertension Neg Hx     Current Outpatient Medications  Medication Sig Dispense Refill   acetaminophen (TYLENOL) 325 MG tablet Take 2 tablets (650 mg total) by mouth every 4 (four) hours as needed for headache or mild pain.     aspirin EC 81 MG EC tablet Take 1 tablet (81 mg total) by mouth daily. Swallow whole. (Patient taking  differently: Take 325 mg by mouth at bedtime. Swallow whole.) 30 tablet 11   atorvastatin (LIPITOR) 20 MG tablet Take 1 tablet (20 mg total) by mouth daily. Please keep upcoming appt in July with Dr. Irish Lack before anymore refills. Thank you (Patient taking differently: Take 20 mg by mouth every evening.) 90 tablet 0   b complex vitamins capsule Take 1 capsule by mouth daily.     BD PEN NEEDLE NANO 2ND GEN 32G X 4 MM MISC SMARTSIG:Injection Daily     Biotin w/ Vitamins C & E (HAIR SKIN & NAILS GUMMIES PO) Take 2 tablets by mouth daily. gummy     Calcium-Magnesium-Zinc (CAL-MAG-ZINC PO) Take 1 tablet by mouth daily.     carvedilol (COREG) 25 MG tablet Take 1 tablet (25 mg total) by mouth 2 (two) times daily with a meal. *Please call and schedule an appointment with Dr Fletcher Anon* (Patient taking differently: Take 25 mg by mouth at bedtime.) 60 tablet 0   cholecalciferol (VITAMIN D) 1000 units tablet Take 2,000 Units by mouth daily. TAKE 6 DAYS PER WEEK BY MOUTH ONCE DAILY.     clopidogrel (PLAVIX) 75 MG tablet TAKE 1 TABLET(75 MG) BY MOUTH AT BEDTIME 30  tablet 3   Coenzyme Q10 (CO Q 10) 100 MG CAPS Take 300 mg by mouth daily.      Continuous Blood Gluc Sensor (FREESTYLE LIBRE 2 SENSOR) MISC APPLY AS DIRECTED AND USE TO MONITOR BLOOD GLUCOSE CONTINUOUSLY. CHANGE EVERY 14 DAYS     Continuous Blood Gluc Sensor (FREESTYLE LIBRE 2 SENSOR) MISC APPLY AS DIRECTED AND USE TO MONITOR BLOOD GLUCOSE CONTINUOUSLY. CHANGE EVERY 14 DAYS     docusate sodium (COLACE) 100 MG capsule Take 1 capsule (100 mg total) by mouth 2 (two) times daily as needed for mild constipation. 10 capsule 0   ergocalciferol (VITAMIN D2) 1.25 MG (50000 UT) capsule Take 50,000 Units by mouth every Friday.      ezetimibe (ZETIA) 10 MG tablet Take 10 mg by mouth daily.     FREESTYLE LITE test strip 2 (two) times daily as needed.     furosemide (LASIX) 40 MG tablet TAKE 1 TABLET(40 MG) BY MOUTH DAILY 90 tablet 2   glucose blood (FREESTYLE LITE) test strip USE TO CHECK BLOOD GLUCOSE TWICE DAILY AND AS NEEDED     insulin lispro (HUMALOG) 100 UNIT/ML injection Inject 10-20 Units into the skin See admin instructions. Take 10 units at breakfast, 20 units at lunch and dinner     iron polysaccharides (NIFEREX) 150 MG capsule Take 150 mg by mouth daily.      lisinopril (PRINIVIL,ZESTRIL) 20 MG tablet Take 20 mg by mouth every evening.      nitroGLYCERIN (NITROSTAT) 0.4 MG SL tablet DISSOLVE 1 TABLET UNDER THE TONGUE EVERY 5 MINUTES AS NEEDED FOR CHEST PAIN 25 tablet 6   pantoprazole (PROTONIX) 40 MG tablet TAKE 1 TABLET BY MOUTH ONCE DAILY AT 6 AM 90 tablet 3   PARoxetine (PAXIL) 40 MG tablet Take 40 mg by mouth at bedtime.      polyethylene glycol (MIRALAX / GLYCOLAX) 17 g packet Take 17 g by mouth daily as needed for mild constipation. 14 each 0   senna (SENOKOT) 8.6 MG TABS tablet Take 1 tablet (8.6 mg total) by mouth daily as needed for mild constipation. 120 tablet 0   insulin aspart (NOVOLOG) 100 UNIT/ML injection Inject 10 Units into the skin 3 (three) times daily with meals. (Patient not  taking: Reported on 11/17/2021)  10 mL 11   insulin glargine (LANTUS) 100 UNIT/ML injection Inject 0.35 mLs (35 Units total) into the skin every evening. (Patient taking differently: Inject 70 Units into the skin at bedtime.) 10 mL 11   potassium chloride SA (KLOR-CON M) 20 MEQ tablet TAKE 1 TABLET(20 MEQ) BY MOUTH DAILY (Patient not taking: Reported on 11/17/2021) 90 tablet 1   No current facility-administered medications for this visit.    Allergies  Allergen Reactions   Sulfa Antibiotics Nausea And Vomiting    Severe vomiting.      REVIEW OF SYSTEMS:   '[X]'$  denotes positive finding, '[ ]'$  denotes negative finding Cardiac  Comments:  Chest pain or chest pressure:    Shortness of breath upon exertion:    Short of breath when lying flat:    Irregular heart rhythm:        Vascular    Pain in calf, thigh, or hip brought on by ambulation:    Pain in feet at night that wakes you up from your sleep:     Blood clot in your veins:    Leg swelling:         Pulmonary    Oxygen at home:    Productive cough:     Wheezing:         Neurologic    Sudden weakness in arms or legs:     Sudden numbness in arms or legs:     Sudden onset of difficulty speaking or slurred speech:    Temporary loss of vision in one eye:     Problems with dizziness:         Gastrointestinal    Blood in stool:     Vomited blood:         Genitourinary    Burning when urinating:     Blood in urine:        Psychiatric    Major depression:         Hematologic    Bleeding problems:    Problems with blood clotting too easily:        Skin    Rashes or ulcers:        Constitutional    Fever or chills:      PHYSICAL EXAMINATION:  Vitals:   11/17/21 1231  BP: 138/70  Pulse: 68  Resp: 18  Temp: (!) 97.5 F (36.4 C)  TempSrc: Temporal  SpO2: 97%  Weight: 182 lb 9.6 oz (82.8 kg)  Height: '5\' 7"'$  (1.702 m)    General:  WDWN in NAD; vital signs documented above Gait: Not observed HENT: WNL,  normocephalic Pulmonary: normal non-labored breathing , without Rales, rhonchi,  wheezing Cardiac: regular HR Abdomen: soft, NT, no masses Skin: without rashes Vascular Exam/Pulses:  Right Left  Radial 2+ (normal) 2+ (normal)  DP BKA absent  PT BKA absent   Extremities: without ischemic changes, without Gangrene , without cellulitis; without open wounds;  Musculoskeletal: no muscle wasting or atrophy  Neurologic: A&O X 3;  No focal weakness or paresthesias are detected Psychiatric:  The pt has Normal affect.   Non-Invasive Vascular Imaging:   329 cm/s velocity of the distal left SFA stent ABI/TBIToday's ABIToday's TBIPrevious ABIPrevious TBI  +-------+-----------+-----------+------------+------------+  Right  BKA                   BKA                       +-------+-----------+-----------+------------+------------+  Left  0.95       0.16       0.98        0.63        ASSESSMENT/PLAN:: 88 y.o. female here for follow up for surveillance of PAD  -Subjectively patient is ambulatory with a prosthetic right leg and her walker without any claudication of left lower extremity.  She also denies any new wounds or rest pain of her left foot. -Arterial duplex demonstrates an increase in velocity at her distal left SFA stent.  This did not affect her left ABI however did affect her left TBI.  The patient and I discussed that she may require repeat angiography in the future.  Rightfully so the patient would like to avoid any further surgery given her complex history.  We agreed to repeat left leg arterial duplex and ABI in another 3 months and patient will follow-up with Dr. Trula Slade to discuss repeat angiography versus conservative management.  She will continue her aspirin, Plavix, statin daily.  She will call/return office sooner if she develops wounds or rest pain.   Dagoberto Ligas, PA-C [facility] [phone]  Clinic MD:   Scot Dock

## 2021-11-21 NOTE — Telephone Encounter (Signed)
Patient has appointment with Dr Irish Lack on 02/14/22.  Office note from [facility] sent to medical records to be scanned into chart.  ?

## 2021-11-22 ENCOUNTER — Other Ambulatory Visit: Payer: Self-pay | Admitting: *Deleted

## 2021-11-22 DIAGNOSIS — I779 Disorder of arteries and arterioles, unspecified: Secondary | ICD-10-CM

## 2021-11-23 DIAGNOSIS — H40029 Open angle with borderline findings, high risk, unspecified eye: Secondary | ICD-10-CM | POA: Diagnosis not present

## 2021-11-23 DIAGNOSIS — H52223 Regular astigmatism, bilateral: Secondary | ICD-10-CM | POA: Diagnosis not present

## 2021-11-23 DIAGNOSIS — H5203 Hypermetropia, bilateral: Secondary | ICD-10-CM | POA: Diagnosis not present

## 2021-11-23 DIAGNOSIS — H524 Presbyopia: Secondary | ICD-10-CM | POA: Diagnosis not present

## 2021-11-23 DIAGNOSIS — H401133 Primary open-angle glaucoma, bilateral, severe stage: Secondary | ICD-10-CM | POA: Diagnosis not present

## 2021-11-23 DIAGNOSIS — H401233 Low-tension glaucoma, bilateral, severe stage: Secondary | ICD-10-CM | POA: Diagnosis not present

## 2021-12-15 DIAGNOSIS — Z9713 Presence of artificial right leg (complete) (partial): Secondary | ICD-10-CM | POA: Diagnosis not present

## 2021-12-15 DIAGNOSIS — F419 Anxiety disorder, unspecified: Secondary | ICD-10-CM | POA: Diagnosis not present

## 2021-12-15 DIAGNOSIS — Z89511 Acquired absence of right leg below knee: Secondary | ICD-10-CM | POA: Diagnosis not present

## 2021-12-15 DIAGNOSIS — H409 Unspecified glaucoma: Secondary | ICD-10-CM | POA: Diagnosis not present

## 2021-12-21 DIAGNOSIS — I2510 Atherosclerotic heart disease of native coronary artery without angina pectoris: Secondary | ICD-10-CM | POA: Diagnosis not present

## 2021-12-21 DIAGNOSIS — I130 Hypertensive heart and chronic kidney disease with heart failure and stage 1 through stage 4 chronic kidney disease, or unspecified chronic kidney disease: Secondary | ICD-10-CM | POA: Diagnosis not present

## 2021-12-21 DIAGNOSIS — E1159 Type 2 diabetes mellitus with other circulatory complications: Secondary | ICD-10-CM | POA: Diagnosis not present

## 2021-12-21 DIAGNOSIS — Z794 Long term (current) use of insulin: Secondary | ICD-10-CM | POA: Diagnosis not present

## 2021-12-21 DIAGNOSIS — N182 Chronic kidney disease, stage 2 (mild): Secondary | ICD-10-CM | POA: Diagnosis not present

## 2022-01-23 DIAGNOSIS — E1129 Type 2 diabetes mellitus with other diabetic kidney complication: Secondary | ICD-10-CM | POA: Diagnosis not present

## 2022-01-23 DIAGNOSIS — F3342 Major depressive disorder, recurrent, in full remission: Secondary | ICD-10-CM | POA: Diagnosis not present

## 2022-01-23 DIAGNOSIS — I2510 Atherosclerotic heart disease of native coronary artery without angina pectoris: Secondary | ICD-10-CM | POA: Diagnosis not present

## 2022-01-23 DIAGNOSIS — D649 Anemia, unspecified: Secondary | ICD-10-CM | POA: Diagnosis not present

## 2022-01-23 DIAGNOSIS — E1140 Type 2 diabetes mellitus with diabetic neuropathy, unspecified: Secondary | ICD-10-CM | POA: Diagnosis not present

## 2022-01-23 DIAGNOSIS — R7989 Other specified abnormal findings of blood chemistry: Secondary | ICD-10-CM | POA: Diagnosis not present

## 2022-01-23 DIAGNOSIS — M8580 Other specified disorders of bone density and structure, unspecified site: Secondary | ICD-10-CM | POA: Diagnosis not present

## 2022-01-23 DIAGNOSIS — I6521 Occlusion and stenosis of right carotid artery: Secondary | ICD-10-CM | POA: Diagnosis not present

## 2022-01-23 DIAGNOSIS — I5022 Chronic systolic (congestive) heart failure: Secondary | ICD-10-CM | POA: Diagnosis not present

## 2022-01-23 DIAGNOSIS — E785 Hyperlipidemia, unspecified: Secondary | ICD-10-CM | POA: Diagnosis not present

## 2022-01-23 DIAGNOSIS — E0590 Thyrotoxicosis, unspecified without thyrotoxic crisis or storm: Secondary | ICD-10-CM | POA: Diagnosis not present

## 2022-01-23 DIAGNOSIS — I130 Hypertensive heart and chronic kidney disease with heart failure and stage 1 through stage 4 chronic kidney disease, or unspecified chronic kidney disease: Secondary | ICD-10-CM | POA: Diagnosis not present

## 2022-01-23 DIAGNOSIS — N182 Chronic kidney disease, stage 2 (mild): Secondary | ICD-10-CM | POA: Diagnosis not present

## 2022-01-26 ENCOUNTER — Other Ambulatory Visit: Payer: Self-pay | Admitting: Vascular Surgery

## 2022-02-13 NOTE — Progress Notes (Signed)
Cardiology Office Note   Date:  02/14/2022   ID:  [name], [name] 29-Sep-1931, MRN [id number]  PCP:  Ginger Organ., MD    No chief complaint on file.    Wt Readings from Last 3 Encounters:  02/14/22 183 lb (83 kg)  11/17/21 182 lb 9.6 oz (82.8 kg)  02/07/21 175 lb 11.2 oz (79.7 kg)       History of Present Illness: [name] is a 88 y.o. female    who had a circumflex stent placed in Jan 2014. She had a PTCA of the OM. She has a CTO of the RCA with good collaterals, managed medically.    She is sen by vascular surgery and is status post atherectomy and angioplasty of an 80% ostial left peroneal artery stenosis and drug-eluting balloon angioplasty of  tandem 60-70% left superficial femoral artery lesions.  This was performed on 04/16/2018 for a wound which has been treated by both Dr. Sharol Given and podiatry.  This wound healed.  She developed a new wound and a repeat angiogram was performed on 09-03-2018.  Stented her left SFA and repeated PTA of her peroneal.   She also has a chest mass which is enlarging thyroid, multinodular.  THere was tracheal deviation.      As of 2021, "Difficulty controlling her diabetes. SHe has had some low glucoses as well while trying for tighter control.  Her husband went to memory care in 2021.  She has moved to an apartment.  She gets around with a walker.     Right ankle fracture.  Then she had a wound developed. In 8/21, she had surgery to remove two toes from the right foot.  She had volume overload after the procedure and echocardiogram showed ejection fraction of 45 to 50%.  In 9/21, she had additional angiography and PTA of the below knee vessels to help heal the amputation site. "   She ultimately had a right BKA.   As of 2022, was taking as needed Lasix every 2 weeks for leg edema.   Overall, she has been doing well since last visit.  She comes with her son today.  Denies : Chest pain. Dizziness. Leg edema. Nitroglycerin use. Orthopnea.  Palpitations. Paroxysmal nocturnal dyspnea.  Syncope.    Has not been very active due to problems with her prosthetic and fear of falling.  She notes some shortness of breath if she gets nervous due to the fear of falling.  Following closely with vascular surgery.    Past Medical History:  Diagnosis Date   Anxiety    Cellulitis 10/2015   LEFT FOOT   CHF (congestive heart failure) (HCC)    Complication of anesthesia    Coronary artery disease    Diabetes mellitus without complication (HCC)    insulin dependent   GERD (gastroesophageal reflux disease)    Hypertension    Hypothyroidism    Neuromuscular disorder (HCC)    muscle cramps to lower extremities   Other primary cardiomyopathies    Peripheral vascular disease (HCC)    PONV (postoperative nausea and vomiting)    Shortness of breath     Past Surgical History:  Procedure Laterality Date   ABDOMINAL AORTOGRAM N/A 09/03/2018   Procedure: ABDOMINAL AORTOGRAM;  Surgeon: Serafina Mitchell, MD;  Location: [facility];  Service: Cardiovascular;  Laterality: N/A;   ABDOMINAL AORTOGRAM W/LOWER EXTREMITY N/A 04/16/2018   Procedure: ABDOMINAL AORTOGRAM W/LOWER EXTREMITY;  Surgeon: Harold Barban  W, MD;  Location: [facility];  Service: Cardiovascular;  Laterality: N/A;  unilateral   ABDOMINAL AORTOGRAM W/LOWER EXTREMITY Bilateral 04/08/2020   Procedure: ABDOMINAL AORTOGRAM W/LOWER EXTREMITY;  Surgeon: Serafina Mitchell, MD;  Location: [facility];  Service: Cardiovascular;  Laterality: Bilateral;   ABDOMINAL HYSTERECTOMY     AMPUTATION Left 12/03/2018   Procedure: Left 3rd ray amputation;  Surgeon: Wylene Simmer, MD;  Location: [facility];  Service: Orthopedics;  Laterality: Left;  29mn   AMPUTATION Right 06/22/2020   Procedure: AMPUTATION BELOW KNEE;  Surgeon: HWylene Simmer MD;  [place] [facility]  Service: Orthopedics;  Laterality: Right;   CHOLECYSTECTOMY     CORONARY ANGIOPLASTY WITH STENT PLACEMENT   08/09/2011   DES  to mid circumflex   I & D EXTREMITY Left 10/29/2015   Procedure: Irrigation and Debridement Left Foot;  Surgeon: MNewt Minion MD;  [place]: [facility]  Service: Orthopedics;  Laterality: Left;   LEFT HEART CATHETERIZATION WITH CORONARY ANGIOGRAM N/A 08/08/2012   Procedure: LEFT HEART CATHETERIZATION WITH CORONARY ANGIOGRAM;  Surgeon: JJettie Booze MD;  [facility];  Service: Cardiovascular;  Laterality: N/A;   PERIPHERAL VASCULAR ATHERECTOMY  04/16/2018   Procedure: PERIPHERAL VASCULAR ATHERECTOMY;  Surgeon: BSerafina Mitchell MD;  [facility];  Service: Cardiovascular;;  lt. Peroneal   PERIPHERAL VASCULAR ATHERECTOMY Right 04/08/2020   Procedure: PERIPHERAL VASCULAR ATHERECTOMY;  Surgeon: BSerafina Mitchell MD;  [facility];  Service: Cardiovascular;  Laterality: Right;  SFA and Peroneal   PERIPHERAL VASCULAR BALLOON ANGIOPLASTY  04/16/2018   Procedure: PERIPHERAL VASCULAR BALLOON ANGIOPLASTY;  Surgeon: BSerafina Mitchell MD;  [facility];  Service: Cardiovascular;;  lt. sfa and AT   PERIPHERAL VASCULAR BALLOON ANGIOPLASTY Left 09/03/2018   Procedure: PERIPHERAL VASCULAR BALLOON ANGIOPLASTY;  Surgeon: BSerafina Mitchell MD;  [facility];  Service: Cardiovascular;  Laterality: Left;  TP TRUNK   PERIPHERAL VASCULAR BALLOON ANGIOPLASTY Right 04/08/2020   Procedure: PERIPHERAL VASCULAR BALLOON ANGIOPLASTY;  Surgeon: BSerafina Mitchell MD;  [facility];  Service: Cardiovascular;  Laterality: Right;  SFA (DCB), Peroneal   PERIPHERAL VASCULAR CATHETERIZATION N/A 12/30/2014   Procedure: Lower Extremity Angiography;  Surgeon: MWellington Hampshire MD;  [facility];  Service: Cardiovascular;  Laterality: N/A;   PERIPHERAL VASCULAR INTERVENTION Left 09/03/2018   Procedure: PERIPHERAL VASCULAR INTERVENTION;  Surgeon: BSerafina Mitchell MD;  [facility];  Service:  Cardiovascular;  Laterality: Left;  SFA STENT    TENDON RELEASE Right 03/11/2020   Procedure: Heel Cord Lengthening;  Surgeon: HWylene Simmer MD;  [place] [facility]  Service: Orthopedics;  Laterality: Right;   THYROID SURGERY     radioactive iodine    TONSILLECTOMY     TRANSMETATARSAL AMPUTATION Right 03/11/2020   Procedure: Right foot transmetatarsal amputation;  Surgeon: HWylene Simmer MD;  [place] [facility]  Service: Orthopedics;  Laterality: Right;     Current Outpatient Medications  Medication Sig Dispense Refill   acetaminophen (TYLENOL) 325 MG tablet Take 2 tablets (650 mg total) by mouth every 4 (four) hours as needed for headache or mild pain.     aspirin EC 81 MG EC tablet Take 1 tablet (81 mg total) by mouth daily. Swallow whole. 30 tablet 11   atorvastatin (LIPITOR) 20 MG tablet Take 1 tablet (20 mg total) by mouth daily. Please keep upcoming appt  in July with Dr. Irish Lack before anymore refills. Thank you 90 tablet 0   b complex vitamins capsule Take 1 capsule by mouth daily.     BD PEN NEEDLE NANO 2ND GEN 32G X 4 MM MISC SMARTSIG:Injection Daily     Biotin w/ Vitamins C & E (HAIR SKIN & NAILS GUMMIES PO) Take 2 tablets by mouth daily. gummy     Calcium-Magnesium-Zinc (CAL-MAG-ZINC PO) Take 1 tablet by mouth daily.     carvedilol (COREG) 25 MG tablet Take 1 tablet (25 mg total) by mouth 2 (two) times daily with a meal. *Please call and schedule an appointment with Dr Fletcher Anon* (Patient taking differently: Take 25 mg by mouth at bedtime.) 60 tablet 0   clopidogrel (PLAVIX) 75 MG tablet TAKE 1 TABLET(75 MG) BY MOUTH AT BEDTIME 30 tablet 3   Coenzyme Q10 (CO Q 10) 100 MG CAPS Take 300 mg by mouth daily.      Continuous Blood Gluc Sensor (FREESTYLE LIBRE 2 SENSOR) MISC APPLY AS DIRECTED AND USE TO MONITOR BLOOD GLUCOSE CONTINUOUSLY. CHANGE EVERY 14 DAYS     Continuous Blood Gluc Sensor (FREESTYLE LIBRE 2 SENSOR) MISC APPLY AS DIRECTED AND USE TO  MONITOR BLOOD GLUCOSE CONTINUOUSLY. CHANGE EVERY 14 DAYS     docusate sodium (COLACE) 100 MG capsule Take 1 capsule (100 mg total) by mouth 2 (two) times daily as needed for mild constipation. 10 capsule 0   ergocalciferol (VITAMIN D2) 1.25 MG (50000 UT) capsule Take 50,000 Units by mouth every Friday.      ezetimibe (ZETIA) 10 MG tablet Take 10 mg by mouth daily.     FREESTYLE LITE test strip 2 (two) times daily as needed.     furosemide (LASIX) 40 MG tablet TAKE 1 TABLET(40 MG) BY MOUTH DAILY 90 tablet 2   glucose blood (FREESTYLE LITE) test strip USE TO CHECK BLOOD GLUCOSE TWICE DAILY AND AS NEEDED     insulin aspart (NOVOLOG FLEXPEN) 100 UNIT/ML FlexPen inject 10 units with breakfast and 20 units with lunch and supper Subcutaneous three times daily and as needed for 30 days     insulin glargine (LANTUS) 100 UNIT/ML injection Inject 0.35 mLs (35 Units total) into the skin every evening. 10 mL 11   insulin lispro (HUMALOG) 100 UNIT/ML injection Inject 10-20 Units into the skin See admin instructions. Take 10 units at breakfast, 20 units at lunch and dinner     iron polysaccharides (NIFEREX) 150 MG capsule Take 150 mg by mouth daily.      lisinopril (PRINIVIL,ZESTRIL) 20 MG tablet Take 20 mg by mouth every evening.      nitroGLYCERIN (NITROSTAT) 0.4 MG SL tablet DISSOLVE 1 TABLET UNDER THE TONGUE EVERY 5 MINUTES AS NEEDED FOR CHEST PAIN 25 tablet 6   pantoprazole (PROTONIX) 40 MG tablet TAKE 1 TABLET BY MOUTH ONCE DAILY AT 6 AM 90 tablet 3   PARoxetine (PAXIL) 40 MG tablet Take 40 mg by mouth at bedtime.      polyethylene glycol (MIRALAX / GLYCOLAX) 17 g packet Take 17 g by mouth daily as needed for mild constipation. 14 each 0   senna (SENOKOT) 8.6 MG TABS tablet Take 1 tablet (8.6 mg total) by mouth daily as needed for mild constipation. 120 tablet 0   potassium chloride SA (KLOR-CON M) 20 MEQ tablet TAKE 1 TABLET(20 MEQ) BY MOUTH DAILY (Patient not taking: Reported on 11/17/2021) 90 tablet 1    No current facility-administered medications for this visit.    Allergies:  Sulfa antibiotics    Social History:  The patient  reports that she quit smoking about 61 years ago. Her smoking use included cigarettes. She has never used smokeless tobacco. She reports current alcohol use. She reports that she does not use drugs.   Family History:  The patient's family history includes Diabetes in her sister; Heart attack in her father and grandchild; Heart disease in her father and son; Sudden death in her grandchild.    ROS:  Please see the history of present illness.   Otherwise, review of systems are positive for palpitations.   All other systems are reviewed and negative.    PHYSICAL EXAM: VS:  BP (!) 140/78   Pulse 81   Ht 5' 8"$  (1.727 m)   Wt 183 lb (83 kg)   SpO2 96%   BMI 27.83 kg/m  , BMI Body mass index is 27.83 kg/m. GEN: Well nourished, well developed, in no acute distress HEENT: normal Neck: no JVD, carotid bruits, or masses Cardiac: RRR; no murmurs, rubs, or gallops,no edema  Respiratory:  clear to auscultation bilaterally, normal work of breathing GI: soft, nontender, nondistended, + BS MS: right BKA Skin: warm and dry, no rash Neuro:  Strength and sensation are intact Psych: euthymic mood, full affect   EKG:   The ekg ordered today demonstrates normal sinus rhythm, nonspecific ST-T wave changes, compared to last year heart rate is slower.  ST changes are unchanged from prior   Recent Labs: No results found for requested labs within last 365 days.   Lipid Panel    Component Value Date/Time   CHOL 141 12/21/2014 0733   TRIG (H) 12/21/2014 0733    442.0 Triglyceride is over 400; calculations on Lipids are invalid.   HDL 29.10 (L) 12/21/2014 0733   CHOLHDL 5 12/21/2014 0733   VLDL 56.6 (H) 06/22/2014 0737   LDLCALC 24 10/20/2013 0739   LDLDIRECT 46.0 12/21/2014 0733     Other studies Reviewed: Additional studies/ records that were reviewed today  with results demonstrating: Labs reviewed.  Potassium normal.  Hemoglobin 9.6 in the past-being followed by her primary care doctor, she takes iron supplement.   ASSESSMENT AND PLAN:  CAD: Prior circumflex stent.  Known CTO of the RCA.  Continue aggressive medical therapy.  Continue aspirin and Plavix.  No significant angina.  I encouraged her to try to use a recumbent bike for activity.  She is afraid of falling as she has some unsteadiness with her prosthetic leg. Chronic diastolic heart failure: She appears euvolemic.  Continue current medications. PAD: Continue statin.  Followed by Dr. Trula Slade.  She did have an elevated velocity in her left SFA stent.  This is being followed by vascular surgery.  They are hoping to avoid repeat angiography. Hyperlipidemia: Whole food, plant-based diet.  High-fiber diet.  LDL 77 in June 2023.  Continue lipid-lowering therapy. Diabetes: She has had difficult to control diabetes.  Healthy diet should help.  A1c 7.3 at last check.   Current medicines are reviewed at length with the patient today.  The patient concerns regarding her medicines were addressed.  The following changes have been made:  No change  Labs/ tests ordered today include: Labs reviewed  Orders Placed This Encounter  Procedures   EKG 12-Lead    Recommend 150 minutes/week of aerobic exercise Low fat, low carb, high fiber diet recommended  Disposition:   FU in 1 year   Signed, Larae Grooms, MD  02/14/2022 10:20 AM  [facility] [address], [place], [place]  [address] Phone: [phone]; Fax: [phone]

## 2022-02-14 ENCOUNTER — Ambulatory Visit (INDEPENDENT_AMBULATORY_CARE_PROVIDER_SITE_OTHER): Payer: PPO | Admitting: Interventional Cardiology

## 2022-02-14 ENCOUNTER — Encounter: Payer: Self-pay | Admitting: Interventional Cardiology

## 2022-02-14 VITALS — BP 140/78 | HR 81 | Ht 68.0 in | Wt 183.0 lb

## 2022-02-14 DIAGNOSIS — E782 Mixed hyperlipidemia: Secondary | ICD-10-CM

## 2022-02-14 DIAGNOSIS — I5032 Chronic diastolic (congestive) heart failure: Secondary | ICD-10-CM

## 2022-02-14 DIAGNOSIS — Z794 Long term (current) use of insulin: Secondary | ICD-10-CM

## 2022-02-14 DIAGNOSIS — I25119 Atherosclerotic heart disease of native coronary artery with unspecified angina pectoris: Secondary | ICD-10-CM

## 2022-02-14 DIAGNOSIS — I739 Peripheral vascular disease, unspecified: Secondary | ICD-10-CM

## 2022-02-14 DIAGNOSIS — E1159 Type 2 diabetes mellitus with other circulatory complications: Secondary | ICD-10-CM | POA: Diagnosis not present

## 2022-02-14 NOTE — Patient Instructions (Signed)

## 2022-02-20 ENCOUNTER — Ambulatory Visit (HOSPITAL_COMMUNITY)
Admission: RE | Admit: 2022-02-20 | Discharge: 2022-02-20 | Disposition: A | Discharge status: Home / Self Care | Payer: HMO | Source: Ambulatory Visit | Attending: Surgery | Admitting: Surgery

## 2022-02-20 ENCOUNTER — Encounter: Payer: Self-pay | Admitting: Surgery

## 2022-02-20 ENCOUNTER — Ambulatory Visit (INDEPENDENT_AMBULATORY_CARE_PROVIDER_SITE_OTHER)
Admission: RE | Admit: 2022-02-20 | Discharge: 2022-02-20 | Disposition: A | Discharge status: Home / Self Care | Payer: HMO | Source: Ambulatory Visit | Attending: Surgery | Admitting: Surgery

## 2022-02-20 ENCOUNTER — Ambulatory Visit: Payer: HMO | Admitting: Surgery

## 2022-02-20 VITALS — BP 144/65 | HR 71 | Temp 97.7°F | Resp 20 | Ht 68.0 in | Wt 184.0 lb

## 2022-02-20 DIAGNOSIS — I779 Disorder of arteries and arterioles, unspecified: Secondary | ICD-10-CM | POA: Diagnosis not present

## 2022-02-20 DIAGNOSIS — I70212 Atherosclerosis of native arteries of extremities with intermittent claudication, left leg: Secondary | ICD-10-CM

## 2022-02-20 NOTE — Progress Notes (Signed)
[facility]  Patient name: [name] MRN: [id number] DOB: 1931/07/26 Sex: female   REASON FOR VISIT:    Follow up  HISOTRY OF PRESENT ILLNESS:    [name] is a 88 y.o. female who has undergone the following:  04/16/2018: Drug-coated balloon angioplasty, left superficial femoral artery, atherectomy, left peroneal artery, angioplasty left anterior tibial artery (ulcer)  09/03/2018: Stent, left superficial femoral artery, balloon angioplasty, left tibioperoneal trunk and peroneal artery (ulcer)  04/08/2020: Atherectomy, right peroneal, superficial femoral, and peroneal artery with drug-coated balloon angioplasty to the right superficial femoral artery (ulcer)  The patient underwent right below-knee amputation by Dr. Doran Durand on 06/22/2020.  She has no active ulcers.  She walks with her prosthesis.     The Patient suffers from coronary artery disease.   She has undergone PCI in the past.  She is a diabetic.  She is on statin for hypercholesterolemia and takes an ACE inhibitor for hypertension.  PAST MEDICAL HISTORY:   Past Medical History:  Diagnosis Date   Anxiety    Cellulitis 10/2015   LEFT FOOT   CHF (congestive heart failure) (HCC)    Complication of anesthesia    Coronary artery disease    Diabetes mellitus without complication (HCC)    insulin dependent   GERD (gastroesophageal reflux disease)    Hypertension    Hypothyroidism    Neuromuscular disorder (HCC)    muscle cramps to lower extremities   Other primary cardiomyopathies    Peripheral vascular disease (HCC)    PONV (postoperative nausea and vomiting)    Shortness of breath      FAMILY HISTORY:   Family History  Problem Relation Age of Onset   Heart disease Father    Heart attack Father    Diabetes Sister    Heart disease Son        before age 11   Heart attack 69        88yrold   Sudden death Grandchild    Hypertension  Neg Hx     SOCIAL HISTORY:   Social History   Tobacco Use   Smoking status: Former    Types: Cigarettes    Quit date: 11/28/1960    Years since quitting: 61.2   Smokeless tobacco: Never  Substance Use Topics   Alcohol use: Yes    Alcohol/week: 0.0 standard drinks of alcohol    Comment: rare     ALLERGIES:   Allergies  Allergen Reactions   Sulfa Antibiotics Nausea And Vomiting    Severe vomiting.      CURRENT MEDICATIONS:   Current Outpatient Medications  Medication Sig Dispense Refill   acetaminophen (TYLENOL) 325 MG tablet Take 2 tablets (650 mg total) by mouth every 4 (four) hours as needed for headache or mild pain.     aspirin EC 81 MG EC tablet Take 1 tablet (81 mg total) by mouth daily. Swallow whole. 30 tablet 11   atorvastatin (LIPITOR) 20 MG tablet Take 1 tablet (20 mg total) by mouth daily. Please keep upcoming appt in July with Dr. VIrish Lackbefore anymore refills. Thank you 90 tablet 0   b complex vitamins capsule Take 1 capsule by mouth daily.     BD PEN NEEDLE NANO 2ND GEN 32G X 4 MM MISC SMARTSIG:Injection Daily     Biotin w/ Vitamins C & E (HAIR SKIN & NAILS GUMMIES PO) Take 2 tablets by mouth daily. gummy     Calcium-Magnesium-Zinc (CAL-MAG-ZINC  PO) Take 1 tablet by mouth daily.     carvedilol (COREG) 25 MG tablet Take 1 tablet (25 mg total) by mouth 2 (two) times daily with a meal. *Please call and schedule an appointment with Dr Fletcher Anon* (Patient taking differently: Take 25 mg by mouth at bedtime.) 60 tablet 0   clopidogrel (PLAVIX) 75 MG tablet TAKE 1 TABLET(75 MG) BY MOUTH AT BEDTIME 30 tablet 3   Coenzyme Q10 (CO Q 10) 100 MG CAPS Take 300 mg by mouth daily.      Continuous Blood Gluc Sensor (FREESTYLE LIBRE 2 SENSOR) MISC APPLY AS DIRECTED AND USE TO MONITOR BLOOD GLUCOSE CONTINUOUSLY. CHANGE EVERY 14 DAYS     Continuous Blood Gluc Sensor (FREESTYLE LIBRE 2 SENSOR) MISC APPLY AS DIRECTED AND USE TO MONITOR BLOOD GLUCOSE CONTINUOUSLY. CHANGE EVERY 14 DAYS      docusate sodium (COLACE) 100 MG capsule Take 1 capsule (100 mg total) by mouth 2 (two) times daily as needed for mild constipation. 10 capsule 0   ergocalciferol (VITAMIN D2) 1.25 MG (50000 UT) capsule Take 50,000 Units by mouth every Friday.      ezetimibe (ZETIA) 10 MG tablet Take 10 mg by mouth daily.     FREESTYLE LITE test strip 2 (two) times daily as needed.     furosemide (LASIX) 40 MG tablet TAKE 1 TABLET(40 MG) BY MOUTH DAILY 90 tablet 2   glucose blood (FREESTYLE LITE) test strip USE TO CHECK BLOOD GLUCOSE TWICE DAILY AND AS NEEDED     insulin lispro (HUMALOG) 100 UNIT/ML injection Inject 10-20 Units into the skin See admin instructions. Take 10 units at breakfast, 20 units at lunch and dinner     iron polysaccharides (NIFEREX) 150 MG capsule Take 150 mg by mouth daily.      lisinopril (PRINIVIL,ZESTRIL) 20 MG tablet Take 20 mg by mouth every evening.      nitroGLYCERIN (NITROSTAT) 0.4 MG SL tablet DISSOLVE 1 TABLET UNDER THE TONGUE EVERY 5 MINUTES AS NEEDED FOR CHEST PAIN 25 tablet 6   pantoprazole (PROTONIX) 40 MG tablet TAKE 1 TABLET BY MOUTH ONCE DAILY AT 6 AM 90 tablet 3   PARoxetine (PAXIL) 40 MG tablet Take 40 mg by mouth at bedtime.      insulin aspart (NOVOLOG FLEXPEN) 100 UNIT/ML FlexPen inject 10 units with breakfast and 20 units with lunch and supper Subcutaneous three times daily and as needed for 30 days (Patient not taking: Reported on 02/20/2022)     insulin glargine (LANTUS) 100 UNIT/ML injection Inject 0.35 mLs (35 Units total) into the skin every evening. (Patient not taking: Reported on 02/20/2022) 10 mL 11   potassium chloride SA (KLOR-CON M) 20 MEQ tablet TAKE 1 TABLET(20 MEQ) BY MOUTH DAILY (Patient not taking: Reported on 02/20/2022) 90 tablet 1   No current facility-administered medications for this visit.    REVIEW OF SYSTEMS:   '[X]'$  denotes positive finding, '[ ]'$  denotes negative finding Cardiac  Comments:  Chest pain or chest pressure:    Shortness of breath  upon exertion:    Short of breath when lying flat:    Irregular heart rhythm:        Vascular    Pain in calf, thigh, or hip brought on by ambulation:    Pain in feet at night that wakes you up from your sleep:     Blood clot in your veins:    Leg swelling:         Pulmonary    Oxygen  at home:    Productive cough:     Wheezing:         Neurologic    Sudden weakness in arms or legs:     Sudden numbness in arms or legs:     Sudden onset of difficulty speaking or slurred speech:    Temporary loss of vision in one eye:     Problems with dizziness:         Gastrointestinal    Blood in stool:     Vomited blood:         Genitourinary    Burning when urinating:     Blood in urine:        Psychiatric    Major depression:         Hematologic    Bleeding problems:    Problems with blood clotting too easily:        Skin    Rashes or ulcers:        Constitutional    Fever or chills:      PHYSICAL EXAM:   Vitals:   02/20/22 1147  BP: (!) 144/65  Pulse: 71  Resp: 20  Temp: 97.7 F (36.5 C)  SpO2: 96%  Weight: 184 lb (83.5 kg)  Height: '5\' 8"'$  (1.727 m)    GENERAL: The patient is a well-nourished female, in no acute distress. The vital signs are documented above. CARDIAC: There is a regular rate and rhythm.  PULMONARY: Non-labored respirations MUSCULOSKELETAL: There are no major deformities or cyanosis. NEUROLOGIC: No focal weakness or paresthesias are detected. SKIN: There are no ulcers or rashes noted. PSYCHIATRIC: The patient has a normal affect.  STUDIES:   I have reviewed the following: +-------+-----------+-----------+------------+------------+  ABI/TBIToday's ABIToday's TBIPrevious ABIPrevious TBI  +-------+-----------+-----------+------------+------------+  Right  BKA        0          BKA         0             +-------+-----------+-----------+------------+------------+  Left   0.89       0.58       0.95        0.16           +-------+-----------+-----------+------------+------------+  Left toe pressure:  72  +-----------+--------+-----+---------------+----------+--------+  LEFT       PSV cm/sRatioStenosis       Waveform  Comments  +-----------+--------+-----+---------------+----------+--------+  CFA Prox   78                          biphasic            +-----------+--------+-----+---------------+----------+--------+  DFA        93                          biphasic            +-----------+--------+-----+---------------+----------+--------+  SFA Prox   102                         biphasic            +-----------+--------+-----+---------------+----------+--------+  SFA Mid    353          50-74% stenosisbiphasic            +-----------+--------+-----+---------------+----------+--------+  SFA Distal 174          50-74% stenosis                    +-----------+--------+-----+---------------+----------+--------+  POP Prox   67                          biphasic  dampened  +-----------+--------+-----+---------------+----------+--------+  POP Mid    54                          biphasic  dampened  +-----------+--------+-----+---------------+----------+--------+  POP Distal 43                          biphasic  dampened  +-----------+--------+-----+---------------+----------+--------+  ATA Distal 37                          monophasic          +-----------+--------+-----+---------------+----------+--------+  PTA Distal 16                          monophasic          +-----------+--------+-----+---------------+----------+--------+  PERO Distal17                          monophasicdampened  +-----------+--------+-----+---------------+----------+--------+      Left Stent(s):  +---------------+---+---------------+--------++  Prox to Stent  62                biphasic  +---------------+---+---------------+--------++  Proximal  Stent 79                biphasic  +---------------+---+---------------+--------++  Mid Stent      87                biphasic  +---------------+---+---------------+--------++  Distal Stent   94                biphasic  +---------------+---+---------------+--------++  Distal to Stent30050-99% stenosisbiphasic  +---------------+---+---------------+--------++  MEDICAL ISSUES:    Pad: I reviewed the ultrasound today with the patient.  She has had a slight decrease in her ABIs with a slight increase in the velocities within her left leg.  I think she is getting dangerously close to needing a repeat angiography to treat in-stent stenosis, however she is not ready to do this.  I have her scheduled for follow-up in 6 months.  If there has been any deterioration in her ABIs or increase in her velocities, she will need to undergo angiography.  She knows to contact me should she develop any change in her symptoms.    Leia Alf, MD, FACS [facility] [phone] Pager [phone]

## 2022-02-24 ENCOUNTER — Other Ambulatory Visit: Payer: Self-pay

## 2022-02-24 DIAGNOSIS — I779 Disorder of arteries and arterioles, unspecified: Secondary | ICD-10-CM

## 2022-02-24 IMAGING — CR DG CHEST 2V
2 series · 2 of 2 positions shown · non-contrast
Comparison: CT chest 12/14/2016

CLINICAL DATA: Short of breath

EXAM:
CHEST - 2 VIEW

[chest lat]
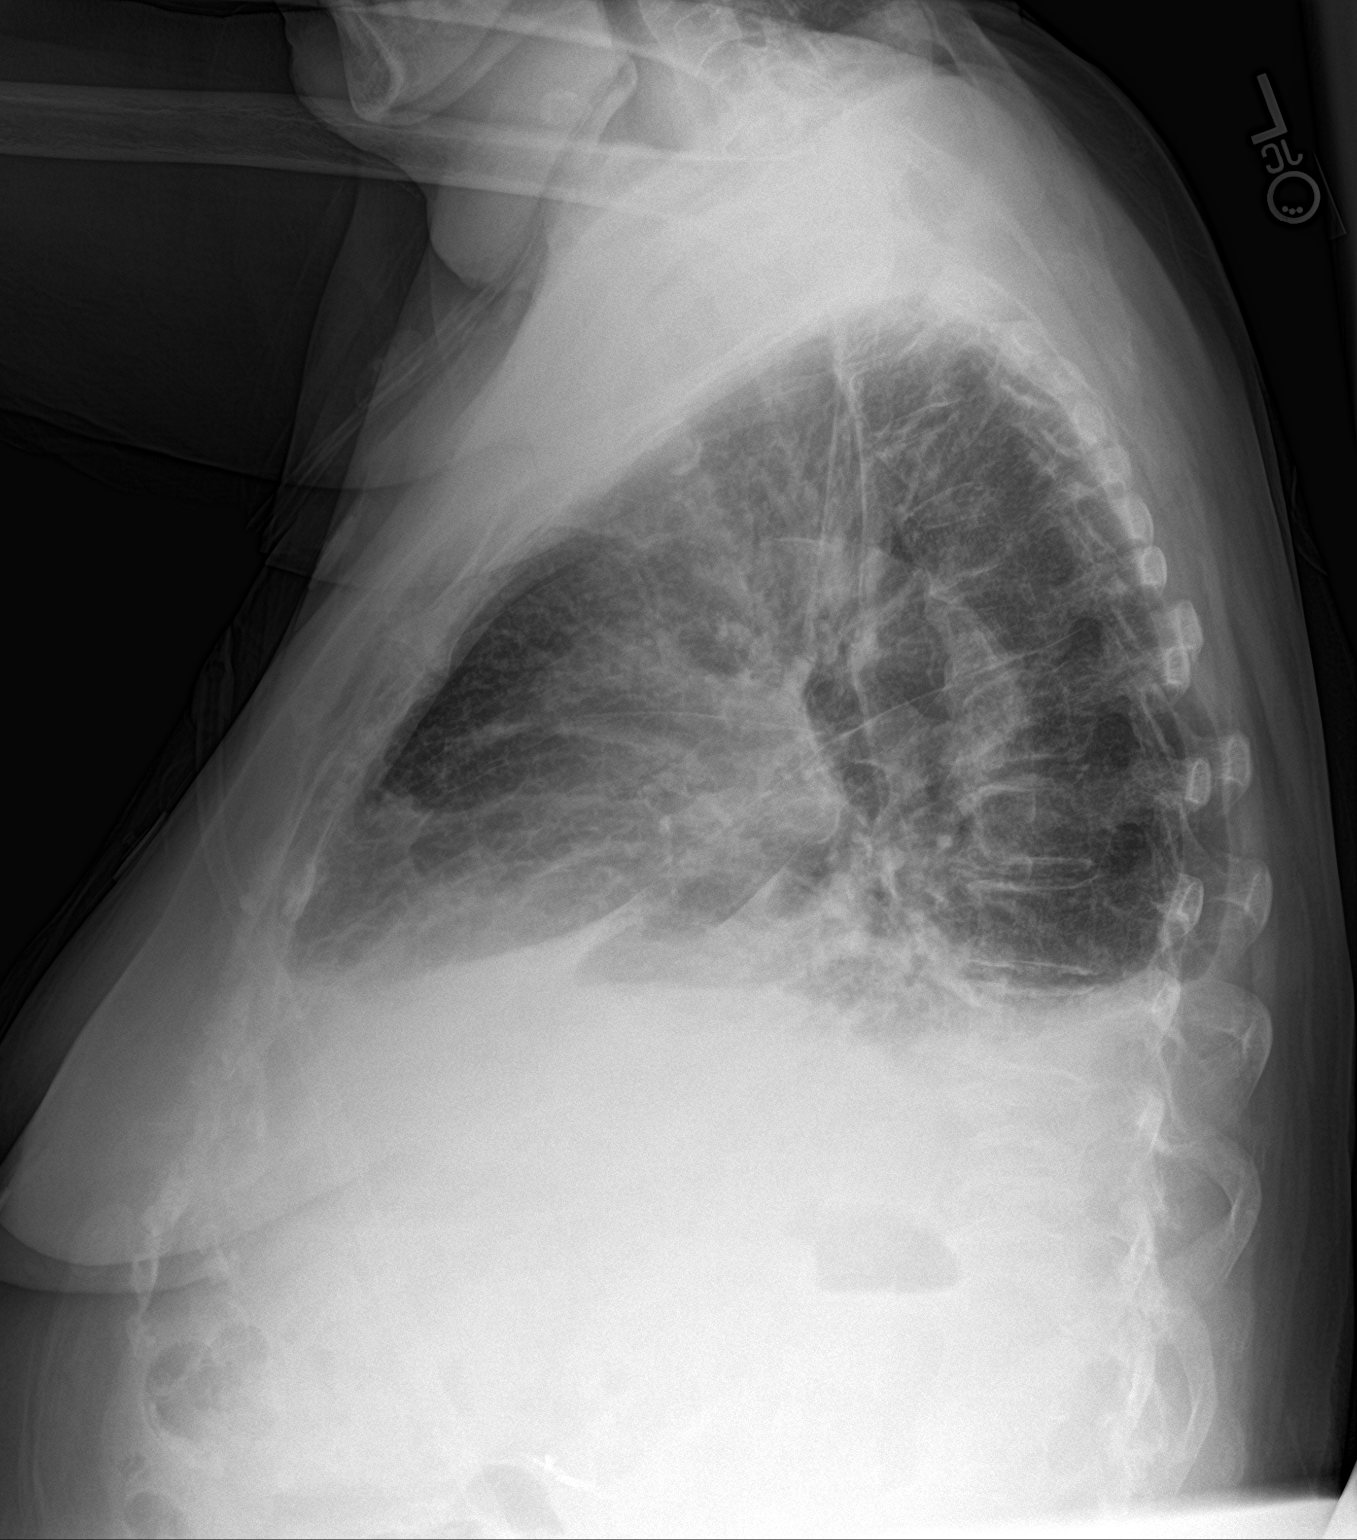

[chest ap]
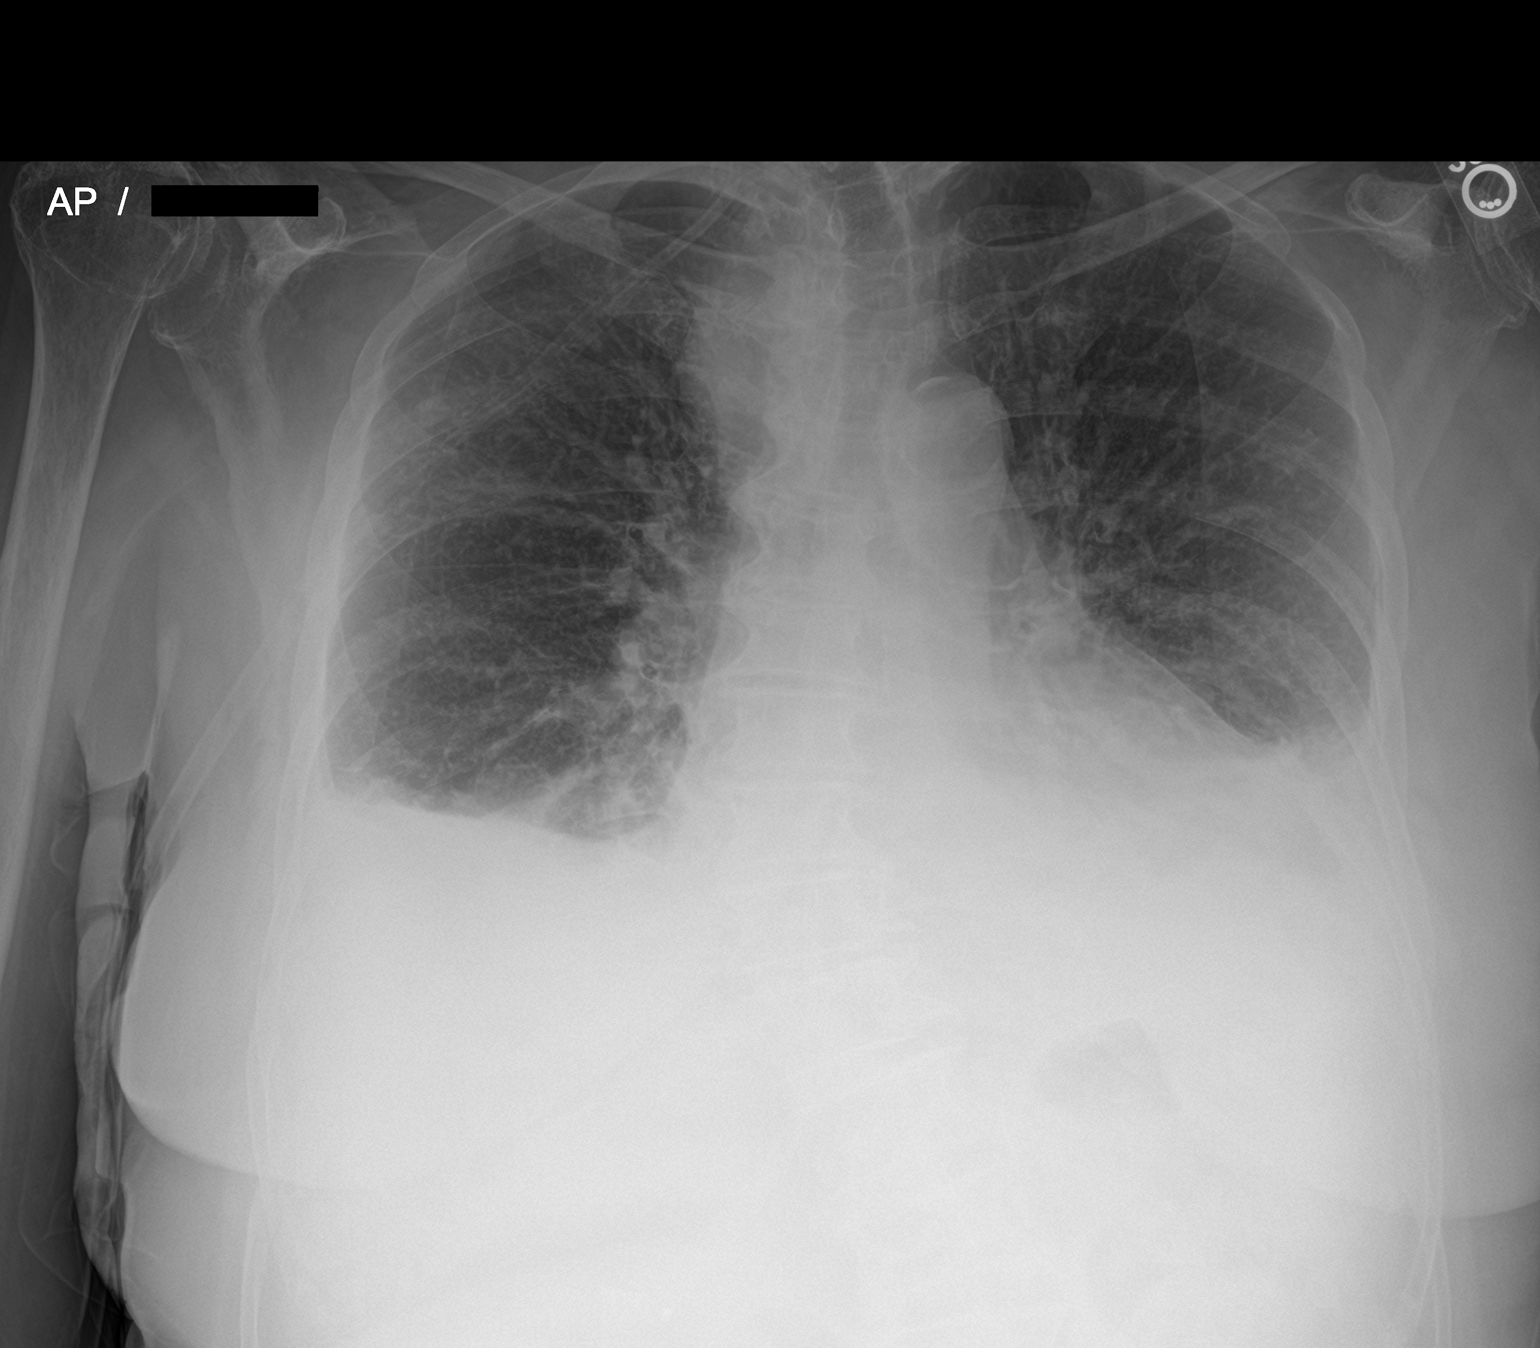

[2 of 2 positions shown; findings below may reference images not displayed]

FINDINGS: Pulmonary vascular congestion with increased interstitial markings
compatible with edema. Small bilateral effusions and bibasilar
atelectasis

Right paratracheal soft tissue mass corresponding to substernal
goiter as noted on CT.
IMPRESSION: Congestive heart failure with mild interstitial edema and bilateral
pleural effusions. Bibasilar atelectasis

Substernal goiter in the right paratracheal region.

## 2022-02-26 IMAGING — DX DG CHEST 2V
2 series · 2 of 2 positions shown · non-contrast
Comparison: Chest radiograph and CT chest dated 03/12/2020.

CLINICAL DATA: Shortness of breath.

EXAM:
CHEST - 2 VIEW

[x chest ap]
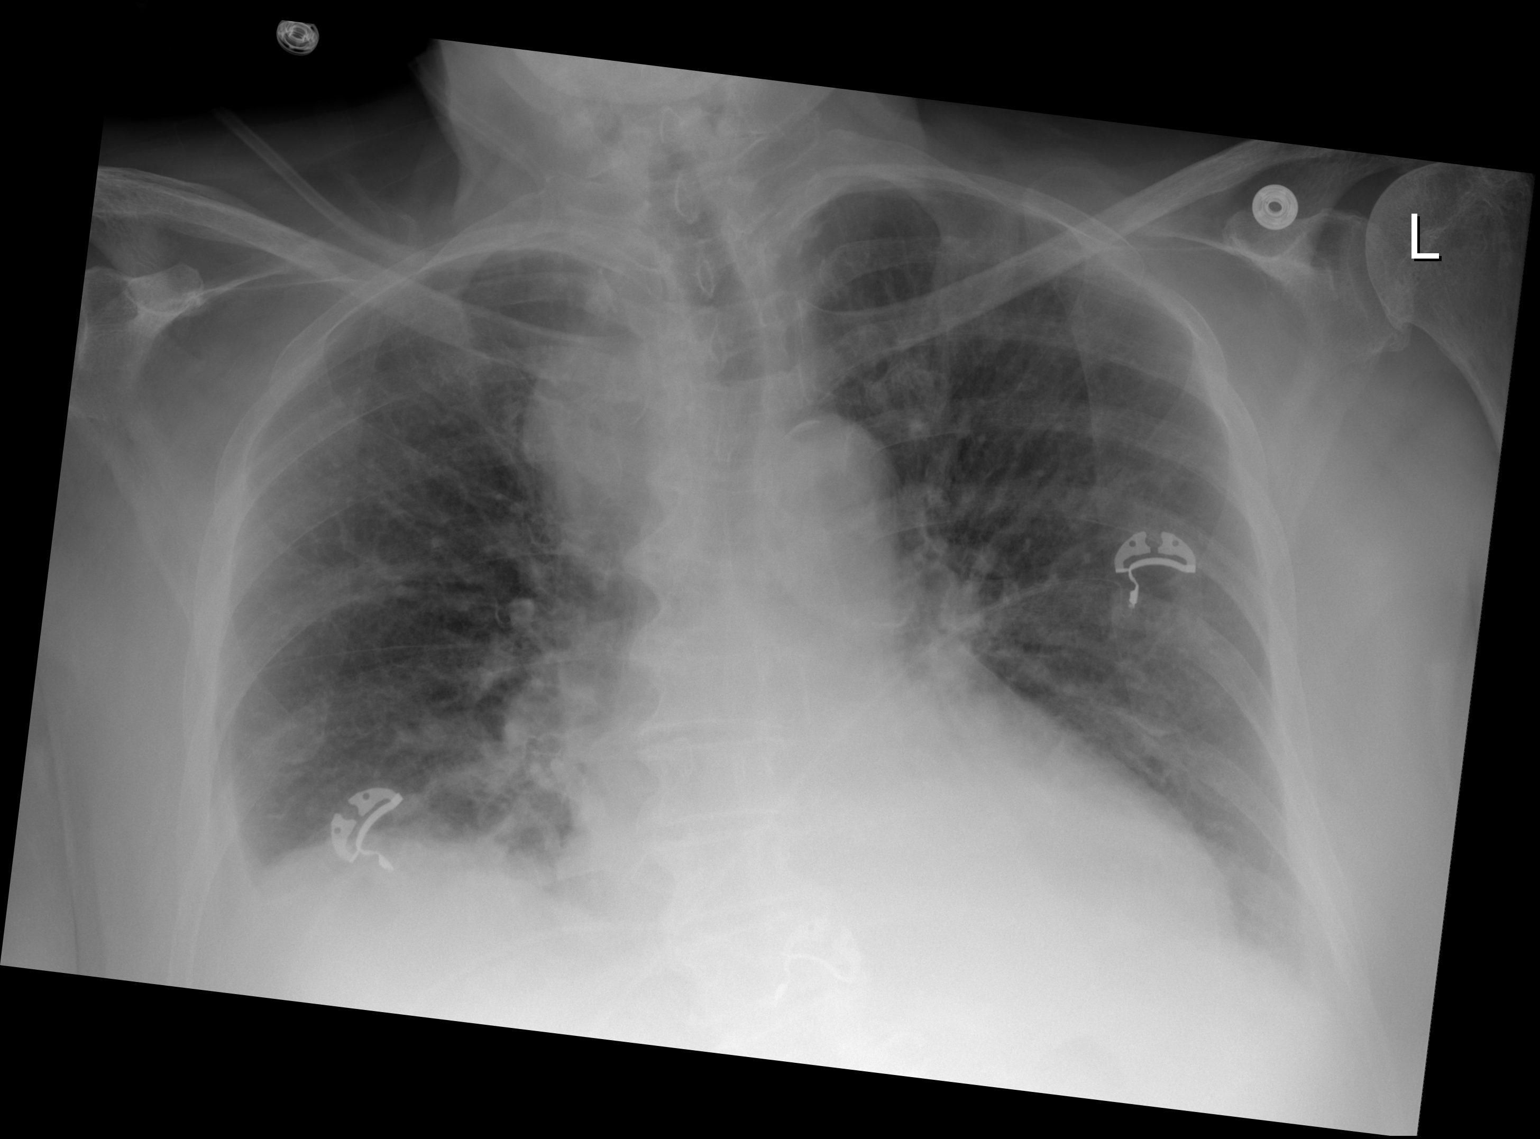

[w chest lat]
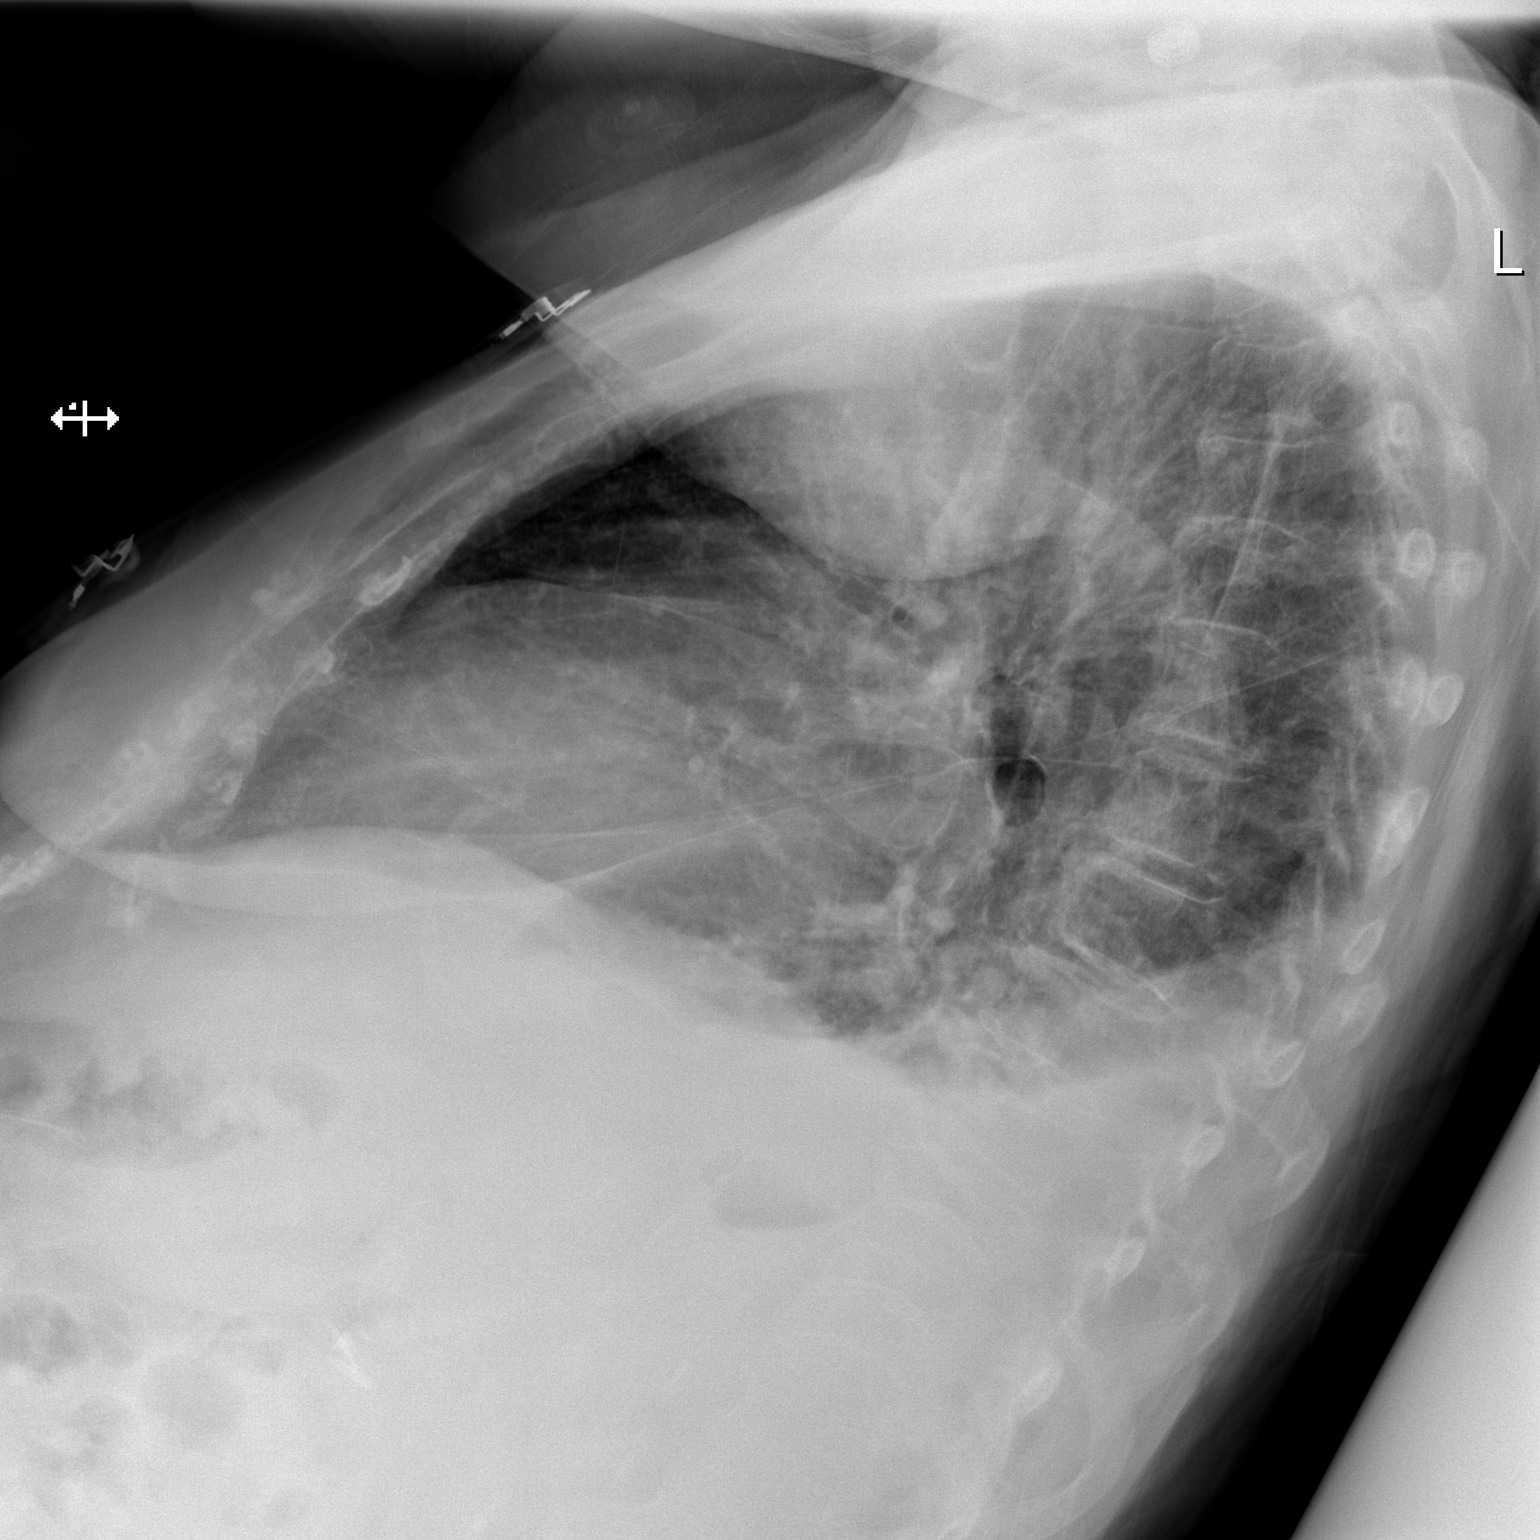

[2 of 2 positions shown; findings below may reference images not displayed]

FINDINGS: The heart remains enlarged. Vascular calcifications are seen in the
aortic arch. Small bilateral pleural effusions with associated
bibasilar atelectasis/airspace disease appear unchanged on the right
and decreased on the left. There is no pneumothorax. The patient's
known thyroid goiter overlies the right upper lung. Degenerative
changes are seen in the spine.
IMPRESSION: Small bilateral pleural effusions with associated bibasilar
atelectasis/airspace disease, unchanged on the right and decreased
on the left.

## 2022-03-03 IMAGING — CR DG CHEST 2V
2 series · 2 of 2 positions shown · non-contrast
Comparison: 03/14/2020

CLINICAL DATA: Shortness of breath and hypoxia. Acute cardiogenic
pulmonary edema.

EXAM:
CHEST - 2 VIEW

[chest lat]
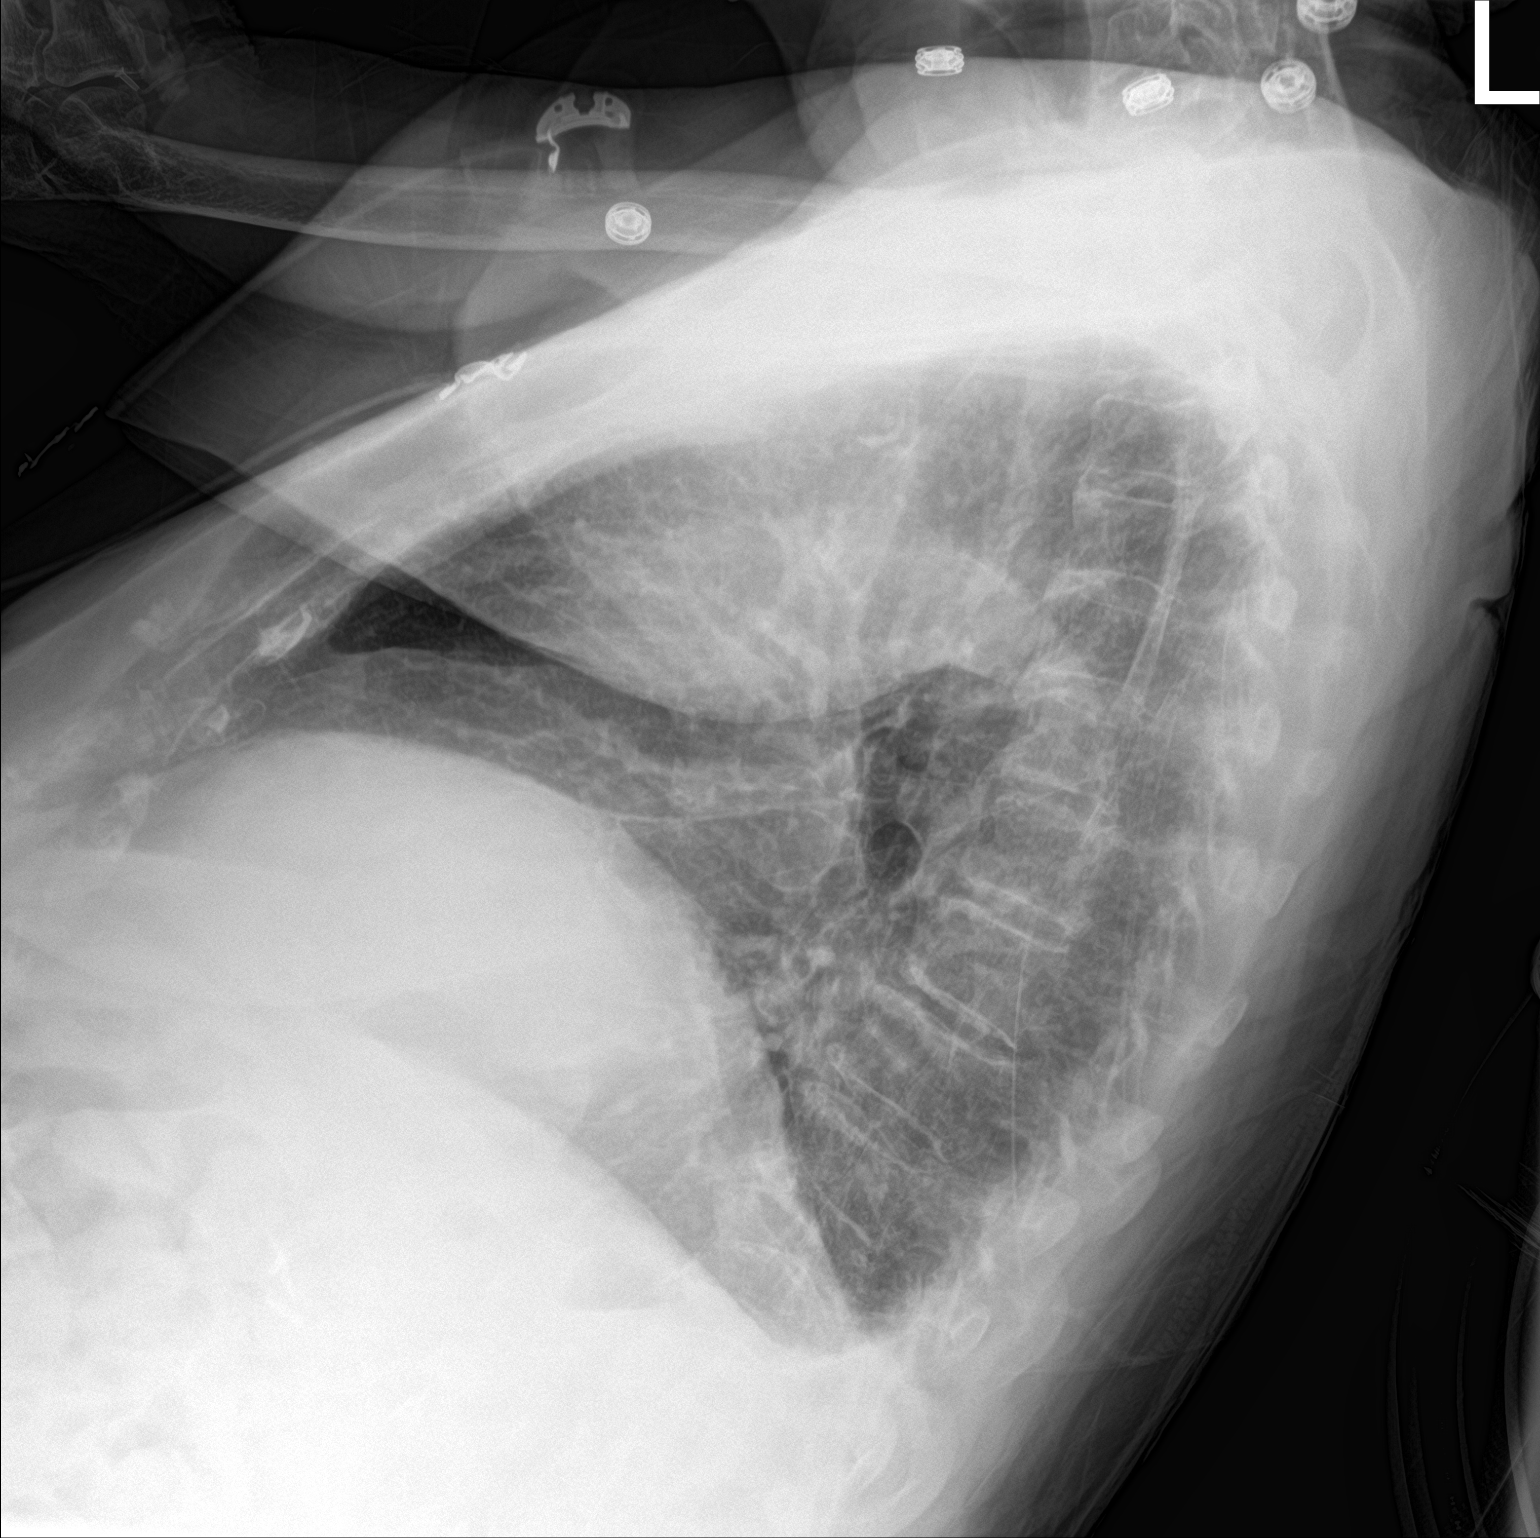

[chest ap]
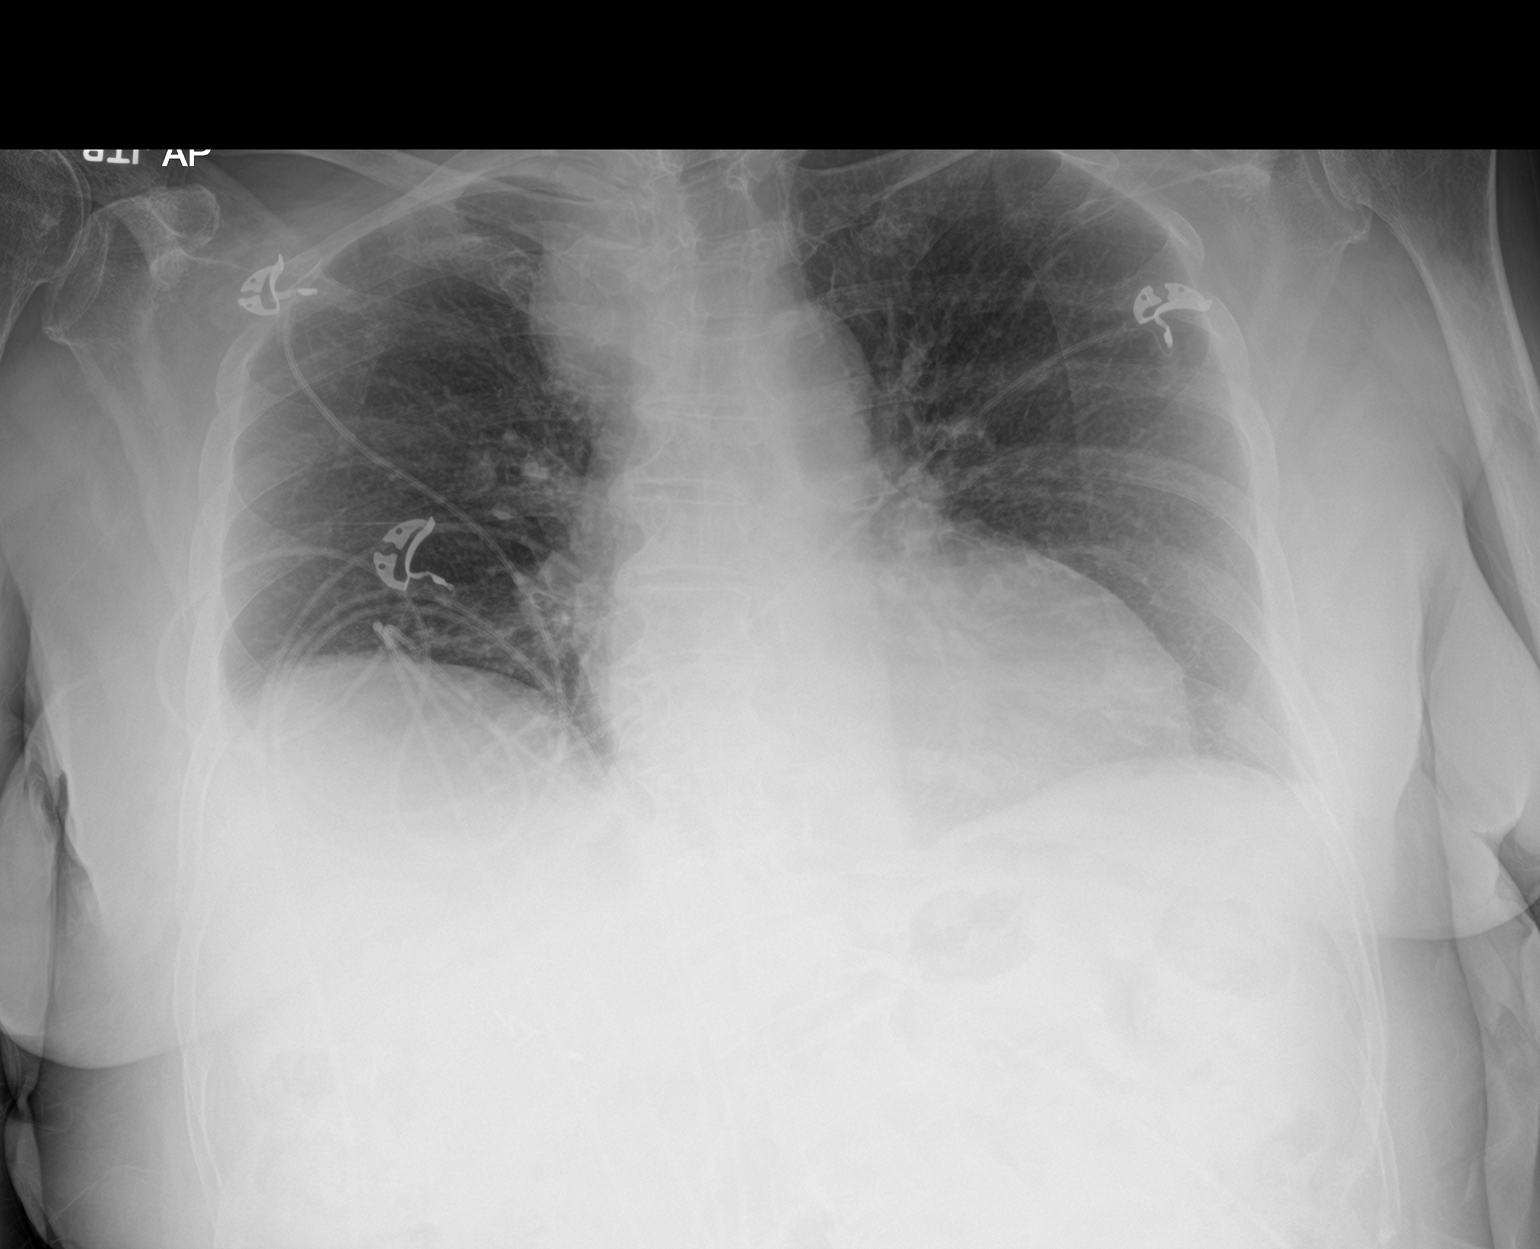

[2 of 2 positions shown; findings below may reference images not displayed]

FINDINGS: Heart size is normal. Opacity along the right paratracheal stripe
corresponds to known sub sternal goiter which extends into the right
paratracheal region. Lung volumes are low. No pleural effusion or
edema identified. No airspace densities.
IMPRESSION: 1. Low lung volumes.  No acute abnormality noted.
2. Known sub sternal goiter extends into the right paratracheal
region.

## 2022-03-17 DIAGNOSIS — Z89511 Acquired absence of right leg below knee: Secondary | ICD-10-CM | POA: Diagnosis not present

## 2022-03-17 DIAGNOSIS — E119 Type 2 diabetes mellitus without complications: Secondary | ICD-10-CM | POA: Diagnosis not present

## 2022-03-17 DIAGNOSIS — E785 Hyperlipidemia, unspecified: Secondary | ICD-10-CM | POA: Diagnosis not present

## 2022-03-17 DIAGNOSIS — Z794 Long term (current) use of insulin: Secondary | ICD-10-CM | POA: Diagnosis not present

## 2022-03-17 DIAGNOSIS — H409 Unspecified glaucoma: Secondary | ICD-10-CM | POA: Diagnosis not present

## 2022-04-13 DIAGNOSIS — H90A21 Sensorineural hearing loss, unilateral, right ear, with restricted hearing on the contralateral side: Secondary | ICD-10-CM | POA: Diagnosis not present

## 2022-05-10 ENCOUNTER — Other Ambulatory Visit: Payer: Self-pay | Admitting: Interventional Cardiology

## 2022-05-28 ENCOUNTER — Other Ambulatory Visit: Payer: Self-pay | Admitting: Interventional Cardiology

## 2022-05-29 ENCOUNTER — Other Ambulatory Visit: Payer: Self-pay | Admitting: Surgery

## 2022-07-18 ENCOUNTER — Other Ambulatory Visit: Payer: Self-pay | Admitting: Interventional Cardiology

## 2022-07-26 DIAGNOSIS — E1129 Type 2 diabetes mellitus with other diabetic kidney complication: Secondary | ICD-10-CM | POA: Diagnosis not present

## 2022-07-26 DIAGNOSIS — D649 Anemia, unspecified: Secondary | ICD-10-CM | POA: Diagnosis not present

## 2022-07-26 DIAGNOSIS — E042 Nontoxic multinodular goiter: Secondary | ICD-10-CM | POA: Diagnosis not present

## 2022-07-26 DIAGNOSIS — E785 Hyperlipidemia, unspecified: Secondary | ICD-10-CM | POA: Diagnosis not present

## 2022-07-26 DIAGNOSIS — I10 Essential (primary) hypertension: Secondary | ICD-10-CM | POA: Diagnosis not present

## 2022-07-26 DIAGNOSIS — M8580 Other specified disorders of bone density and structure, unspecified site: Secondary | ICD-10-CM | POA: Diagnosis not present

## 2022-08-02 ENCOUNTER — Encounter (HOSPITAL_BASED_OUTPATIENT_CLINIC_OR_DEPARTMENT_OTHER): Payer: Self-pay

## 2022-08-02 ENCOUNTER — Other Ambulatory Visit (HOSPITAL_COMMUNITY): Payer: Self-pay | Admitting: Internal Medicine

## 2022-08-02 ENCOUNTER — Ambulatory Visit (HOSPITAL_BASED_OUTPATIENT_CLINIC_OR_DEPARTMENT_OTHER)
Admission: RE | Admit: 2022-08-02 | Discharge: 2022-08-02 | Disposition: A | Discharge status: Home / Self Care | Payer: PPO | Source: Ambulatory Visit | Attending: Internal Medicine | Admitting: Internal Medicine

## 2022-08-02 DIAGNOSIS — E1129 Type 2 diabetes mellitus with other diabetic kidney complication: Secondary | ICD-10-CM | POA: Diagnosis not present

## 2022-08-02 DIAGNOSIS — Z794 Long term (current) use of insulin: Secondary | ICD-10-CM | POA: Diagnosis not present

## 2022-08-02 DIAGNOSIS — I130 Hypertensive heart and chronic kidney disease with heart failure and stage 1 through stage 4 chronic kidney disease, or unspecified chronic kidney disease: Secondary | ICD-10-CM | POA: Diagnosis not present

## 2022-08-02 DIAGNOSIS — N182 Chronic kidney disease, stage 2 (mild): Secondary | ICD-10-CM | POA: Diagnosis not present

## 2022-08-02 DIAGNOSIS — M25551 Pain in right hip: Secondary | ICD-10-CM | POA: Diagnosis not present

## 2022-08-02 DIAGNOSIS — K760 Fatty (change of) liver, not elsewhere classified: Secondary | ICD-10-CM | POA: Diagnosis not present

## 2022-08-02 DIAGNOSIS — Z0000 Encounter for general adult medical examination without abnormal findings: Secondary | ICD-10-CM | POA: Diagnosis not present

## 2022-08-02 DIAGNOSIS — Z1339 Encounter for screening examination for other mental health and behavioral disorders: Secondary | ICD-10-CM | POA: Diagnosis not present

## 2022-08-02 DIAGNOSIS — R109 Unspecified abdominal pain: Secondary | ICD-10-CM | POA: Diagnosis not present

## 2022-08-02 DIAGNOSIS — K551 Chronic vascular disorders of intestine: Secondary | ICD-10-CM | POA: Diagnosis not present

## 2022-08-02 DIAGNOSIS — S88111A Complete traumatic amputation at level between knee and ankle, right lower leg, initial encounter: Secondary | ICD-10-CM | POA: Diagnosis not present

## 2022-08-02 DIAGNOSIS — I739 Peripheral vascular disease, unspecified: Secondary | ICD-10-CM | POA: Diagnosis not present

## 2022-08-02 DIAGNOSIS — I2510 Atherosclerotic heart disease of native coronary artery without angina pectoris: Secondary | ICD-10-CM | POA: Diagnosis not present

## 2022-08-02 DIAGNOSIS — I429 Cardiomyopathy, unspecified: Secondary | ICD-10-CM | POA: Diagnosis not present

## 2022-08-02 DIAGNOSIS — Z1331 Encounter for screening for depression: Secondary | ICD-10-CM | POA: Diagnosis not present

## 2022-08-02 DIAGNOSIS — I5022 Chronic systolic (congestive) heart failure: Secondary | ICD-10-CM | POA: Diagnosis not present

## 2022-08-02 MED ORDER — IOHEXOL 350 MG/ML SOLN
100.0000 mL | Freq: Once | INTRAVENOUS | Status: AC | PRN
Start: 2022-08-02 — End: 2022-08-02
  Administered 2022-08-02: 75 mL via INTRAVENOUS

## 2022-09-05 DIAGNOSIS — H524 Presbyopia: Secondary | ICD-10-CM | POA: Diagnosis not present

## 2022-09-05 DIAGNOSIS — H52223 Regular astigmatism, bilateral: Secondary | ICD-10-CM | POA: Diagnosis not present

## 2022-09-05 DIAGNOSIS — H401233 Low-tension glaucoma, bilateral, severe stage: Secondary | ICD-10-CM | POA: Diagnosis not present

## 2022-09-05 DIAGNOSIS — H5203 Hypermetropia, bilateral: Secondary | ICD-10-CM | POA: Diagnosis not present

## 2022-09-22 DIAGNOSIS — H401233 Low-tension glaucoma, bilateral, severe stage: Secondary | ICD-10-CM | POA: Diagnosis not present

## 2022-09-22 DIAGNOSIS — H35373 Puckering of macula, bilateral: Secondary | ICD-10-CM | POA: Diagnosis not present

## 2022-09-22 DIAGNOSIS — H524 Presbyopia: Secondary | ICD-10-CM | POA: Diagnosis not present

## 2022-09-22 DIAGNOSIS — H5203 Hypermetropia, bilateral: Secondary | ICD-10-CM | POA: Diagnosis not present

## 2022-09-22 DIAGNOSIS — H52223 Regular astigmatism, bilateral: Secondary | ICD-10-CM | POA: Diagnosis not present

## 2022-10-02 DIAGNOSIS — Z89511 Acquired absence of right leg below knee: Secondary | ICD-10-CM | POA: Diagnosis not present

## 2022-10-11 ENCOUNTER — Other Ambulatory Visit: Payer: Self-pay | Admitting: *Deleted

## 2022-10-11 DIAGNOSIS — I70212 Atherosclerosis of native arteries of extremities with intermittent claudication, left leg: Secondary | ICD-10-CM

## 2022-10-11 DIAGNOSIS — I779 Disorder of arteries and arterioles, unspecified: Secondary | ICD-10-CM

## 2022-10-17 DIAGNOSIS — M549 Dorsalgia, unspecified: Secondary | ICD-10-CM | POA: Diagnosis not present

## 2022-10-17 DIAGNOSIS — Z9181 History of falling: Secondary | ICD-10-CM | POA: Diagnosis not present

## 2022-10-17 DIAGNOSIS — E1142 Type 2 diabetes mellitus with diabetic polyneuropathy: Secondary | ICD-10-CM | POA: Diagnosis not present

## 2022-10-17 DIAGNOSIS — Z9713 Presence of artificial right leg (complete) (partial): Secondary | ICD-10-CM | POA: Diagnosis not present

## 2022-10-17 DIAGNOSIS — E1151 Type 2 diabetes mellitus with diabetic peripheral angiopathy without gangrene: Secondary | ICD-10-CM | POA: Diagnosis not present

## 2022-10-17 DIAGNOSIS — Z794 Long term (current) use of insulin: Secondary | ICD-10-CM | POA: Diagnosis not present

## 2022-10-17 DIAGNOSIS — Z89511 Acquired absence of right leg below knee: Secondary | ICD-10-CM | POA: Diagnosis not present

## 2022-10-18 DIAGNOSIS — I110 Hypertensive heart disease with heart failure: Secondary | ICD-10-CM | POA: Diagnosis not present

## 2022-10-18 DIAGNOSIS — Z794 Long term (current) use of insulin: Secondary | ICD-10-CM | POA: Diagnosis not present

## 2022-10-18 DIAGNOSIS — I25119 Atherosclerotic heart disease of native coronary artery with unspecified angina pectoris: Secondary | ICD-10-CM | POA: Diagnosis not present

## 2022-10-18 DIAGNOSIS — E663 Overweight: Secondary | ICD-10-CM | POA: Diagnosis not present

## 2022-10-18 DIAGNOSIS — I509 Heart failure, unspecified: Secondary | ICD-10-CM | POA: Diagnosis not present

## 2022-10-18 DIAGNOSIS — Z89511 Acquired absence of right leg below knee: Secondary | ICD-10-CM | POA: Diagnosis not present

## 2022-10-18 DIAGNOSIS — E1142 Type 2 diabetes mellitus with diabetic polyneuropathy: Secondary | ICD-10-CM | POA: Diagnosis not present

## 2022-10-18 DIAGNOSIS — E1169 Type 2 diabetes mellitus with other specified complication: Secondary | ICD-10-CM | POA: Diagnosis not present

## 2022-10-18 DIAGNOSIS — E559 Vitamin D deficiency, unspecified: Secondary | ICD-10-CM | POA: Diagnosis not present

## 2022-10-18 DIAGNOSIS — E1151 Type 2 diabetes mellitus with diabetic peripheral angiopathy without gangrene: Secondary | ICD-10-CM | POA: Diagnosis not present

## 2022-10-18 DIAGNOSIS — F3342 Major depressive disorder, recurrent, in full remission: Secondary | ICD-10-CM | POA: Diagnosis not present

## 2022-10-18 DIAGNOSIS — E261 Secondary hyperaldosteronism: Secondary | ICD-10-CM | POA: Diagnosis not present

## 2022-10-19 DIAGNOSIS — M5134 Other intervertebral disc degeneration, thoracic region: Secondary | ICD-10-CM | POA: Diagnosis not present

## 2022-10-19 DIAGNOSIS — E785 Hyperlipidemia, unspecified: Secondary | ICD-10-CM | POA: Diagnosis not present

## 2022-10-19 DIAGNOSIS — M546 Pain in thoracic spine: Secondary | ICD-10-CM | POA: Diagnosis not present

## 2022-10-23 ENCOUNTER — Ambulatory Visit: Payer: PPO | Admitting: Surgery

## 2022-10-23 ENCOUNTER — Encounter: Payer: Self-pay | Admitting: Surgery

## 2022-10-23 ENCOUNTER — Ambulatory Visit (HOSPITAL_COMMUNITY)
Admission: RE | Admit: 2022-10-23 | Discharge: 2022-10-23 | Disposition: A | Discharge status: Home / Self Care | Payer: PPO | Source: Ambulatory Visit | Attending: Surgery | Admitting: Surgery

## 2022-10-23 ENCOUNTER — Ambulatory Visit (INDEPENDENT_AMBULATORY_CARE_PROVIDER_SITE_OTHER)
Admission: RE | Admit: 2022-10-23 | Discharge: 2022-10-23 | Disposition: A | Discharge status: Home / Self Care | Payer: PPO | Source: Ambulatory Visit | Attending: Surgery | Admitting: Surgery

## 2022-10-23 VITALS — BP 149/78 | HR 69 | Temp 98.0°F | Resp 20 | Ht 68.0 in | Wt 184.0 lb

## 2022-10-23 DIAGNOSIS — I779 Disorder of arteries and arterioles, unspecified: Secondary | ICD-10-CM

## 2022-10-23 DIAGNOSIS — I70213 Atherosclerosis of native arteries of extremities with intermittent claudication, bilateral legs: Secondary | ICD-10-CM

## 2022-10-23 DIAGNOSIS — I70212 Atherosclerosis of native arteries of extremities with intermittent claudication, left leg: Secondary | ICD-10-CM

## 2022-10-23 LAB — VAS US ABI WITH/WO TBI: Left ABI: 0.67

## 2022-10-23 NOTE — Progress Notes (Signed)
[facility] [place]  Patient name: [name] MRN: [id number] DOB: 1932/04/07 Sex: female   REASON FOR VISIT:    Follow up  HISOTRY OF PRESENT ILLNESS:    [name] is a 89 y.o. female who has undergone the following:  04/16/2018: Drug-coated balloon angioplasty, left superficial femoral artery, atherectomy, left peroneal artery, angioplasty left anterior tibial artery (ulcer)   09/03/2018: Stent, left superficial femoral artery, balloon angioplasty, left tibioperoneal trunk and peroneal artery (ulcer)   04/08/2020: Atherectomy, right peroneal, superficial femoral, and peroneal artery with drug-coated balloon angioplasty to the right superficial femoral artery (ulcer)   The patient underwent right below-knee amputation by Dr. [name] on 06/22/2020.  She has no active ulcers.  She walks with her prosthesis.   I saw her 6 months ago and she had progressive stenosis in her mid left SFA with velocities of 353 cm/s.  The patient was not interested in intervention at that time.  She now returns for follow-up.  She has no new complaints   The Patient suffers from coronary artery disease.   She has undergone PCI in the past.  She is a diabetic.  She is on statin for hypercholesterolemia and takes an ACE inhibitor for hypertension.   PAST MEDICAL HISTORY:   Past Medical History:  Diagnosis Date   Anxiety    Cellulitis 10/2015   LEFT FOOT   CHF (congestive heart failure)    Complication of anesthesia    Coronary artery disease    Diabetes mellitus without complication    insulin dependent   GERD (gastroesophageal reflux disease)    Hypertension    Hypothyroidism    Neuromuscular disorder    muscle cramps to lower extremities   Other primary cardiomyopathies    Peripheral vascular disease    PONV (postoperative nausea and vomiting)    Shortness of breath      FAMILY HISTORY:   Family History  Problem Relation Age  of Onset   Heart disease Father    Heart attack Father    Diabetes Sister    Heart disease Son        before age 10   Heart attack Grandchild        49yr old   Sudden death Grandchild    Hypertension Neg Hx     SOCIAL HISTORY:   Social History   Tobacco Use   Smoking status: Former    Types: Cigarettes    Quit date: 11/28/1960    Years since quitting: 61.9   Smokeless tobacco: Never  Substance Use Topics   Alcohol use: Yes    Alcohol/week: 0.0 standard drinks of alcohol    Comment: rare     ALLERGIES:   Allergies  Allergen Reactions   Sulfa Antibiotics Nausea And Vomiting    Severe vomiting.      CURRENT MEDICATIONS:   Current Outpatient Medications  Medication Sig Dispense Refill   acetaminophen (TYLENOL) 325 MG tablet Take 2 tablets (650 mg total) by mouth every 4 (four) hours as needed for headache or mild pain.     aspirin EC 81 MG EC tablet Take 1 tablet (81 mg total) by mouth daily. Swallow whole. 30 tablet 11   atorvastatin (LIPITOR) 20 MG tablet Take 1 tablet (20 mg total) by mouth daily. Please keep upcoming appt in July with Dr. Eldridge Dace before anymore refills. Thank you 90 tablet 0   b complex vitamins capsule Take 1 capsule by mouth daily.  BD PEN NEEDLE NANO 2ND GEN 32G X 4 MM MISC SMARTSIG:Injection Daily     Biotin w/ Vitamins C & E (HAIR SKIN & NAILS GUMMIES PO) Take 2 tablets by mouth daily. gummy     Calcium-Magnesium-Zinc (CAL-MAG-ZINC PO) Take 1 tablet by mouth daily.     carvedilol (COREG) 25 MG tablet Take 1 tablet (25 mg total) by mouth 2 (two) times daily with a meal. *Please call and schedule an appointment with Dr Kirke Corin* (Patient taking differently: Take 25 mg by mouth at bedtime.) 60 tablet 0   clopidogrel (PLAVIX) 75 MG tablet TAKE 1 TABLET(75 MG) BY MOUTH AT BEDTIME 30 tablet 3   Coenzyme Q10 (CO Q 10) 100 MG CAPS Take 300 mg by mouth daily.      Continuous Blood Gluc Sensor (FREESTYLE LIBRE 2 SENSOR) MISC APPLY AS DIRECTED AND USE TO  MONITOR BLOOD GLUCOSE CONTINUOUSLY. CHANGE EVERY 14 DAYS     Continuous Blood Gluc Sensor (FREESTYLE LIBRE 2 SENSOR) MISC APPLY AS DIRECTED AND USE TO MONITOR BLOOD GLUCOSE CONTINUOUSLY. CHANGE EVERY 14 DAYS     docusate sodium (COLACE) 100 MG capsule Take 1 capsule (100 mg total) by mouth 2 (two) times daily as needed for mild constipation. 10 capsule 0   ergocalciferol (VITAMIN D2) 1.25 MG (50000 UT) capsule Take 50,000 Units by mouth every Friday.      ezetimibe (ZETIA) 10 MG tablet Take 10 mg by mouth daily.     FREESTYLE LITE test strip 2 (two) times daily as needed.     furosemide (LASIX) 40 MG tablet TAKE 1 TABLET(40 MG) BY MOUTH DAILY 90 tablet 2   glucose blood (FREESTYLE LITE) test strip USE TO CHECK BLOOD GLUCOSE TWICE DAILY AND AS NEEDED     insulin aspart (NOVOLOG FLEXPEN) 100 UNIT/ML FlexPen      insulin glargine (LANTUS) 100 UNIT/ML injection Inject 0.35 mLs (35 Units total) into the skin every evening. 10 mL 11   insulin lispro (HUMALOG) 100 UNIT/ML injection Inject 10-20 Units into the skin See admin instructions. Take 10 units at breakfast, 20 units at lunch and dinner     iron polysaccharides (NIFEREX) 150 MG capsule Take 150 mg by mouth daily.      lisinopril (PRINIVIL,ZESTRIL) 20 MG tablet Take 20 mg by mouth every evening.      nitroGLYCERIN (NITROSTAT) 0.4 MG SL tablet DISSOLVE 1 TABLET UNDER THE TONGUE EVERY 5 MINUTES AS NEEDED FOR CHEST PAIN 25 tablet 8   pantoprazole (PROTONIX) 40 MG tablet TAKE 1 TABLET BY MOUTH EVERY DAY AT 6 AM 90 tablet 2   PARoxetine (PAXIL) 40 MG tablet Take 40 mg by mouth at bedtime.      potassium chloride SA (KLOR-CON M) 20 MEQ tablet TAKE 1 TABLET(20 MEQ) BY MOUTH DAILY 90 tablet 1   No current facility-administered medications for this visit.    REVIEW OF SYSTEMS:   [X]  denotes positive finding, [ ]  denotes negative finding Cardiac  Comments:  Chest pain or chest pressure:    Shortness of breath upon exertion:    Short of breath when  lying flat:    Irregular heart rhythm:        Vascular    Pain in calf, thigh, or hip brought on by ambulation:    Pain in feet at night that wakes you up from your sleep:     Blood clot in your veins:    Leg swelling:         Pulmonary  Oxygen at home:    Productive cough:     Wheezing:         Neurologic    Sudden weakness in arms or legs:     Sudden numbness in arms or legs:     Sudden onset of difficulty speaking or slurred speech:    Temporary loss of vision in one eye:     Problems with dizziness:         Gastrointestinal    Blood in stool:     Vomited blood:         Genitourinary    Burning when urinating:     Blood in urine:        Psychiatric    Major depression:         Hematologic    Bleeding problems:    Problems with blood clotting too easily:        Skin    Rashes or ulcers:        Constitutional    Fever or chills:      PHYSICAL EXAM:   Vitals:   10/23/22 1214  BP: (!) 149/78  Pulse: 69  Resp: 20  Temp: 98 F (36.7 C)  SpO2: 97%  Weight: 184 lb (83.5 kg)  Height: 5\' 8"  (1.727 m)    GENERAL: The patient is a well-nourished female, in no acute distress. The vital signs are documented above. CARDIAC: There is a regular rate and rhythm.  PULMONARY: Non-labored respirations MUSCULOSKELETAL: There are no major deformities or cyanosis. NEUROLOGIC: No focal weakness or paresthesias are detected. SKIN: There are no ulcers or rashes noted. PSYCHIATRIC: The patient has a normal affect.  STUDIES:   I have reviewed the following: +-------+-----------+-----------+------------+------------+  ABI/TBIToday's ABIToday's TBIPrevious ABIPrevious TBI  +-------+-----------+-----------+------------+------------+  Right BKA        BKA        BKA         BKA           +-------+-----------+-----------+------------+------------+  Left  0.67       0.59       0.89        0.58           +-------+-----------+-----------+------------+------------+     . Left Stent(s):  +---------------+--------+---------------+----------+----------+  SFA           PSV cm/sStenosis       Waveform  Comments    +---------------+--------+---------------+----------+----------+  Prox to Stent  76                     biphasic              +---------------+--------+---------------+----------+----------+  Proximal Stent 322     50-99% stenosisbiphasic              +---------------+--------+---------------+----------+----------+  Mid Stent      284     50-99% stenosisbiphasic  Collateral  +---------------+--------+---------------+----------+----------+  Distal Stent   175                    monophasic            +---------------+--------+---------------+----------+----------+  Distal to Stent118                    monophasic            +---------------+--------+---------------+----------+----------+   MEDICAL ISSUES:   Lower extremity atherosclerotic vascular disease: The patient has had progressive stenosis within her left SFA stents, now with significant ABI drops.  I recommended that we proceed with further evaluation via angiography from a right femoral approach.  I plan on intervening in the left leg likely with DCB, or possible stenting.  She wants to get this scheduled in early May.    Charlena CrossWells Elim Economou, IV, MD, FACS [facility] Tel [phone] Pager [phone]

## 2022-10-23 NOTE — H&P (View-Only) (Signed)
 [facility] [place]  Patient name: [name] MRN: [id number] DOB: 08/10/1931 Sex: female   REASON FOR VISIT:    Follow up  HISOTRY OF PRESENT ILLNESS:    [name] is a 89 y.o. female who has undergone the following:  04/16/2018: Drug-coated balloon angioplasty, left superficial femoral artery, atherectomy, left peroneal artery, angioplasty left anterior tibial artery (ulcer)   09/03/2018: Stent, left superficial femoral artery, balloon angioplasty, left tibioperoneal trunk and peroneal artery (ulcer)   04/08/2020: Atherectomy, right peroneal, superficial femoral, and peroneal artery with drug-coated balloon angioplasty to the right superficial femoral artery (ulcer)   The patient underwent right below-knee amputation by Dr. Hewitt on 06/22/2020.  She has no active ulcers.  She walks with her prosthesis.   I saw her 6 months ago and she had progressive stenosis in her mid left SFA with velocities of 353 cm/s.  The patient was not interested in intervention at that time.  She now returns for follow-up.  She has no new complaints   The Patient suffers from coronary artery disease.   She has undergone PCI in the past.  She is a diabetic.  She is on statin for hypercholesterolemia and takes an ACE inhibitor for hypertension.   PAST MEDICAL HISTORY:   Past Medical History:  Diagnosis Date   Anxiety    Cellulitis 10/2015   LEFT FOOT   CHF (congestive heart failure)    Complication of anesthesia    Coronary artery disease    Diabetes mellitus without complication    insulin dependent   GERD (gastroesophageal reflux disease)    Hypertension    Hypothyroidism    Neuromuscular disorder    muscle cramps to lower extremities   Other primary cardiomyopathies    Peripheral vascular disease    PONV (postoperative nausea and vomiting)    Shortness of breath      FAMILY HISTORY:   Family History  Problem Relation Age  of Onset   Heart disease Father    Heart attack Father    Diabetes Sister    Heart disease Son        before age 60   Heart attack Grandchild        2yr old   Sudden death Grandchild    Hypertension Neg Hx     SOCIAL HISTORY:   Social History   Tobacco Use   Smoking status: Former    Types: Cigarettes    Quit date: 11/28/1960    Years since quitting: 61.9   Smokeless tobacco: Never  Substance Use Topics   Alcohol use: Yes    Alcohol/week: 0.0 standard drinks of alcohol    Comment: rare     ALLERGIES:   Allergies  Allergen Reactions   Sulfa Antibiotics Nausea And Vomiting    Severe vomiting.      CURRENT MEDICATIONS:   Current Outpatient Medications  Medication Sig Dispense Refill   acetaminophen (TYLENOL) 325 MG tablet Take 2 tablets (650 mg total) by mouth every 4 (four) hours as needed for headache or mild pain.     aspirin EC 81 MG EC tablet Take 1 tablet (81 mg total) by mouth daily. Swallow whole. 30 tablet 11   atorvastatin (LIPITOR) 20 MG tablet Take 1 tablet (20 mg total) by mouth daily. Please keep upcoming appt in July with Dr. Varanasi before anymore refills. Thank you 90 tablet 0   b complex vitamins capsule Take 1 capsule by mouth daily.       BD PEN NEEDLE NANO 2ND GEN 32G X 4 MM MISC SMARTSIG:Injection Daily     Biotin w/ Vitamins C & E (HAIR SKIN & NAILS GUMMIES PO) Take 2 tablets by mouth daily. gummy     Calcium-Magnesium-Zinc (CAL-MAG-ZINC PO) Take 1 tablet by mouth daily.     carvedilol (COREG) 25 MG tablet Take 1 tablet (25 mg total) by mouth 2 (two) times daily with a meal. *Please call and schedule an appointment with Dr Arida* (Patient taking differently: Take 25 mg by mouth at bedtime.) 60 tablet 0   clopidogrel (PLAVIX) 75 MG tablet TAKE 1 TABLET(75 MG) BY MOUTH AT BEDTIME 30 tablet 3   Coenzyme Q10 (CO Q 10) 100 MG CAPS Take 300 mg by mouth daily.      Continuous Blood Gluc Sensor (FREESTYLE LIBRE 2 SENSOR) MISC APPLY AS DIRECTED AND USE TO  MONITOR BLOOD GLUCOSE CONTINUOUSLY. CHANGE EVERY 14 DAYS     Continuous Blood Gluc Sensor (FREESTYLE LIBRE 2 SENSOR) MISC APPLY AS DIRECTED AND USE TO MONITOR BLOOD GLUCOSE CONTINUOUSLY. CHANGE EVERY 14 DAYS     docusate sodium (COLACE) 100 MG capsule Take 1 capsule (100 mg total) by mouth 2 (two) times daily as needed for mild constipation. 10 capsule 0   ergocalciferol (VITAMIN D2) 1.25 MG (50000 UT) capsule Take 50,000 Units by mouth every Friday.      ezetimibe (ZETIA) 10 MG tablet Take 10 mg by mouth daily.     FREESTYLE LITE test strip 2 (two) times daily as needed.     furosemide (LASIX) 40 MG tablet TAKE 1 TABLET(40 MG) BY MOUTH DAILY 90 tablet 2   glucose blood (FREESTYLE LITE) test strip USE TO CHECK BLOOD GLUCOSE TWICE DAILY AND AS NEEDED     insulin aspart (NOVOLOG FLEXPEN) 100 UNIT/ML FlexPen      insulin glargine (LANTUS) 100 UNIT/ML injection Inject 0.35 mLs (35 Units total) into the skin every evening. 10 mL 11   insulin lispro (HUMALOG) 100 UNIT/ML injection Inject 10-20 Units into the skin See admin instructions. Take 10 units at breakfast, 20 units at lunch and dinner     iron polysaccharides (NIFEREX) 150 MG capsule Take 150 mg by mouth daily.      lisinopril (PRINIVIL,ZESTRIL) 20 MG tablet Take 20 mg by mouth every evening.      nitroGLYCERIN (NITROSTAT) 0.4 MG SL tablet DISSOLVE 1 TABLET UNDER THE TONGUE EVERY 5 MINUTES AS NEEDED FOR CHEST PAIN 25 tablet 8   pantoprazole (PROTONIX) 40 MG tablet TAKE 1 TABLET BY MOUTH EVERY DAY AT 6 AM 90 tablet 2   PARoxetine (PAXIL) 40 MG tablet Take 40 mg by mouth at bedtime.      potassium chloride SA (KLOR-CON M) 20 MEQ tablet TAKE 1 TABLET(20 MEQ) BY MOUTH DAILY 90 tablet 1   No current facility-administered medications for this visit.    REVIEW OF SYSTEMS:   [X] denotes positive finding, [ ] denotes negative finding Cardiac  Comments:  Chest pain or chest pressure:    Shortness of breath upon exertion:    Short of breath when  lying flat:    Irregular heart rhythm:        Vascular    Pain in calf, thigh, or hip brought on by ambulation:    Pain in feet at night that wakes you up from your sleep:     Blood clot in your veins:    Leg swelling:         Pulmonary      Oxygen at home:    Productive cough:     Wheezing:         Neurologic    Sudden weakness in arms or legs:     Sudden numbness in arms or legs:     Sudden onset of difficulty speaking or slurred speech:    Temporary loss of vision in one eye:     Problems with dizziness:         Gastrointestinal    Blood in stool:     Vomited blood:         Genitourinary    Burning when urinating:     Blood in urine:        Psychiatric    Major depression:         Hematologic    Bleeding problems:    Problems with blood clotting too easily:        Skin    Rashes or ulcers:        Constitutional    Fever or chills:      PHYSICAL EXAM:   Vitals:   10/23/22 1214  BP: (!) 149/78  Pulse: 69  Resp: 20  Temp: 98 F (36.7 C)  SpO2: 97%  Weight: 184 lb (83.5 kg)  Height: 5' 8" (1.727 m)    GENERAL: The patient is a well-nourished female, in no acute distress. The vital signs are documented above. CARDIAC: There is a regular rate and rhythm.  PULMONARY: Non-labored respirations MUSCULOSKELETAL: There are no major deformities or cyanosis. NEUROLOGIC: No focal weakness or paresthesias are detected. SKIN: There are no ulcers or rashes noted. PSYCHIATRIC: The patient has a normal affect.  STUDIES:   I have reviewed the following: +-------+-----------+-----------+------------+------------+  ABI/TBIToday's ABIToday's TBIPrevious ABIPrevious TBI  +-------+-----------+-----------+------------+------------+  Right BKA        BKA        BKA         BKA           +-------+-----------+-----------+------------+------------+  Left  0.67       0.59       0.89        0.58           +-------+-----------+-----------+------------+------------+     . Left Stent(s):  +---------------+--------+---------------+----------+----------+  SFA           PSV cm/sStenosis       Waveform  Comments    +---------------+--------+---------------+----------+----------+  Prox to Stent  76                     biphasic              +---------------+--------+---------------+----------+----------+  Proximal Stent 322     50-99% stenosisbiphasic              +---------------+--------+---------------+----------+----------+  Mid Stent      284     50-99% stenosisbiphasic  Collateral  +---------------+--------+---------------+----------+----------+  Distal Stent   175                    monophasic            +---------------+--------+---------------+----------+----------+  Distal to Stent118                    monophasic            +---------------+--------+---------------+----------+----------+   MEDICAL ISSUES:   Lower extremity atherosclerotic vascular disease: The patient has had progressive stenosis within her left SFA stents, now with significant ABI drops.    I recommended that we proceed with further evaluation via angiography from a right femoral approach.  I plan on intervening in the left leg likely with DCB, or possible stenting.  She wants to get this scheduled in early May.    Wells Maekayla Giorgio, IV, MD, FACS [facility] [place] Tel [phone] Pager [phone]  

## 2022-10-24 ENCOUNTER — Other Ambulatory Visit: Payer: Self-pay

## 2022-10-24 DIAGNOSIS — I70213 Atherosclerosis of native arteries of extremities with intermittent claudication, bilateral legs: Secondary | ICD-10-CM

## 2022-10-26 DIAGNOSIS — I2510 Atherosclerotic heart disease of native coronary artery without angina pectoris: Secondary | ICD-10-CM | POA: Diagnosis not present

## 2022-10-26 DIAGNOSIS — E1129 Type 2 diabetes mellitus with other diabetic kidney complication: Secondary | ICD-10-CM | POA: Diagnosis not present

## 2022-10-26 DIAGNOSIS — I130 Hypertensive heart and chronic kidney disease with heart failure and stage 1 through stage 4 chronic kidney disease, or unspecified chronic kidney disease: Secondary | ICD-10-CM | POA: Diagnosis not present

## 2022-10-26 DIAGNOSIS — Z794 Long term (current) use of insulin: Secondary | ICD-10-CM | POA: Diagnosis not present

## 2022-10-26 DIAGNOSIS — N182 Chronic kidney disease, stage 2 (mild): Secondary | ICD-10-CM | POA: Diagnosis not present

## 2022-11-21 ENCOUNTER — Encounter (HOSPITAL_COMMUNITY): Payer: Self-pay | Admitting: Surgery

## 2022-11-21 ENCOUNTER — Encounter (HOSPITAL_COMMUNITY)
Admission: RE | Disposition: A | Discharge status: Home / Self Care | Payer: Self-pay | Source: Home / Self Care | Attending: Surgery

## 2022-11-21 ENCOUNTER — Other Ambulatory Visit: Payer: Self-pay

## 2022-11-21 ENCOUNTER — Ambulatory Visit (HOSPITAL_COMMUNITY)
Admission: RE | Admit: 2022-11-21 | Discharge: 2022-11-21 | Disposition: A | Discharge status: Home / Self Care | Payer: PPO | Attending: Surgery | Admitting: Surgery

## 2022-11-21 DIAGNOSIS — E7800 Pure hypercholesterolemia, unspecified: Secondary | ICD-10-CM | POA: Insufficient documentation

## 2022-11-21 DIAGNOSIS — Z87891 Personal history of nicotine dependence: Secondary | ICD-10-CM | POA: Diagnosis not present

## 2022-11-21 DIAGNOSIS — T82858A Stenosis of vascular prosthetic devices, implants and grafts, initial encounter: Secondary | ICD-10-CM | POA: Diagnosis not present

## 2022-11-21 DIAGNOSIS — E1151 Type 2 diabetes mellitus with diabetic peripheral angiopathy without gangrene: Secondary | ICD-10-CM | POA: Insufficient documentation

## 2022-11-21 DIAGNOSIS — I10 Essential (primary) hypertension: Secondary | ICD-10-CM | POA: Diagnosis not present

## 2022-11-21 DIAGNOSIS — Y832 Surgical operation with anastomosis, bypass or graft as the cause of abnormal reaction of the patient, or of later complication, without mention of misadventure at the time of the procedure: Secondary | ICD-10-CM | POA: Insufficient documentation

## 2022-11-21 DIAGNOSIS — I2510 Atherosclerotic heart disease of native coronary artery without angina pectoris: Secondary | ICD-10-CM | POA: Insufficient documentation

## 2022-11-21 DIAGNOSIS — Z79899 Other long term (current) drug therapy: Secondary | ICD-10-CM | POA: Diagnosis not present

## 2022-11-21 DIAGNOSIS — I70213 Atherosclerosis of native arteries of extremities with intermittent claudication, bilateral legs: Secondary | ICD-10-CM

## 2022-11-21 DIAGNOSIS — Z794 Long term (current) use of insulin: Secondary | ICD-10-CM | POA: Insufficient documentation

## 2022-11-21 DIAGNOSIS — I70212 Atherosclerosis of native arteries of extremities with intermittent claudication, left leg: Secondary | ICD-10-CM | POA: Diagnosis not present

## 2022-11-21 HISTORY — PX: ABDOMINAL AORTOGRAM W/LOWER EXTREMITY: CATH118223

## 2022-11-21 HISTORY — PX: PERIPHERAL VASCULAR INTERVENTION: CATH118257

## 2022-11-21 LAB — POCT I-STAT, CHEM 8
BUN: 18 mg/dL (ref 8–23)
Calcium, Ion: 1.16 mmol/L (ref 1.15–1.40)
Chloride: 104 mmol/L (ref 98–111)
Creatinine, Ser: 0.8 mg/dL (ref 0.44–1.00)
Glucose, Bld: 212 mg/dL — ABNORMAL HIGH (ref 70–99)
HCT: 39 % (ref 36.0–46.0)
Hemoglobin: 13.3 g/dL (ref 12.0–15.0)
Potassium: 4.7 mmol/L (ref 3.5–5.1)
Sodium: 140 mmol/L (ref 135–145)
TCO2: 26 mmol/L (ref 22–32)

## 2022-11-21 LAB — POCT ACTIVATED CLOTTING TIME
Activated Clotting Time: 223 seconds
Activated Clotting Time: 185 seconds

## 2022-11-21 LAB — GLUCOSE, CAPILLARY
Glucose-Capillary: 140 mg/dL — ABNORMAL HIGH (ref 70–99)
Glucose-Capillary: 126 mg/dL — ABNORMAL HIGH (ref 70–99)

## 2022-11-21 SURGERY — ABDOMINAL AORTOGRAM W/LOWER EXTREMITY
Anesthesia: LOCAL

## 2022-11-21 MED ORDER — SODIUM CHLORIDE 0.9 % IV SOLN: 250.0000 mL | INTRAVENOUS | Status: DC | PRN

## 2022-11-21 MED ORDER — ATORVASTATIN CALCIUM 20 MG PO TABS: 20.0000 mg | ORAL_TABLET | Freq: Every day | ORAL | 11 refills | Status: AC

## 2022-11-21 MED ORDER — IODIXANOL 320 MG/ML IV SOLN
INTRAVENOUS | Status: DC | PRN
  Administered 2022-11-21: 50 mL via INTRA_ARTERIAL

## 2022-11-21 MED ORDER — FENTANYL CITRATE (PF) 100 MCG/2ML IJ SOLN
INTRAMUSCULAR | Status: DC | PRN
  Administered 2022-11-21: 50 ug via INTRAVENOUS

## 2022-11-21 MED ORDER — MIDAZOLAM HCL 2 MG/2ML IJ SOLN
INTRAMUSCULAR | Status: DC | PRN
  Administered 2022-11-21: 1 mg via INTRAVENOUS

## 2022-11-21 MED ORDER — LABETALOL HCL 5 MG/ML IV SOLN: 10.0000 mg | INTRAVENOUS | Status: DC | PRN

## 2022-11-21 MED ORDER — OXYCODONE HCL 5 MG PO TABS: 5.0000 mg | ORAL_TABLET | ORAL | Status: DC | PRN

## 2022-11-21 MED ORDER — SODIUM CHLORIDE 0.9 % IV SOLN: INTRAVENOUS | Status: DC

## 2022-11-21 MED ORDER — CLOPIDOGREL BISULFATE 300 MG PO TABS
ORAL_TABLET | ORAL | Status: DC | PRN
  Administered 2022-11-21: 75 mg via ORAL

## 2022-11-21 MED ORDER — FENTANYL CITRATE (PF) 100 MCG/2ML IJ SOLN
INTRAMUSCULAR | Status: AC
  Filled 2022-11-21: qty 2

## 2022-11-21 MED ORDER — HYDRALAZINE HCL 20 MG/ML IJ SOLN: 5.0000 mg | INTRAMUSCULAR | Status: DC | PRN

## 2022-11-21 MED ORDER — HEPARIN (PORCINE) IN NACL 1000-0.9 UT/500ML-% IV SOLN
INTRAVENOUS | Status: DC | PRN
  Administered 2022-11-21: 1000 mL

## 2022-11-21 MED ORDER — LIDOCAINE HCL (PF) 1 % IJ SOLN
INTRAMUSCULAR | Status: DC | PRN
  Administered 2022-11-21: 5 mL

## 2022-11-21 MED ORDER — MORPHINE SULFATE (PF) 2 MG/ML IV SOLN: 2.0000 mg | INTRAVENOUS | Status: DC | PRN

## 2022-11-21 MED ORDER — SODIUM CHLORIDE 0.9 % WEIGHT BASED INFUSION: 1.0000 mL/kg/h | INTRAVENOUS | Status: DC

## 2022-11-21 MED ORDER — SODIUM CHLORIDE 0.9% FLUSH: 3.0000 mL | Freq: Two times a day (BID) | INTRAVENOUS | Status: DC

## 2022-11-21 MED ORDER — CLOPIDOGREL BISULFATE 75 MG PO TABS: 75.0000 mg | ORAL_TABLET | Freq: Every day | ORAL | Status: DC

## 2022-11-21 MED ORDER — ONDANSETRON HCL 4 MG/2ML IJ SOLN: 4.0000 mg | Freq: Four times a day (QID) | INTRAMUSCULAR | Status: DC | PRN

## 2022-11-21 MED ORDER — ACETAMINOPHEN 325 MG PO TABS: 650.0000 mg | ORAL_TABLET | ORAL | Status: DC | PRN

## 2022-11-21 MED ORDER — ASPIRIN 81 MG PO TBEC: 81.0000 mg | DELAYED_RELEASE_TABLET | Freq: Every day | ORAL | Status: DC

## 2022-11-21 MED ORDER — MIDAZOLAM HCL 2 MG/2ML IJ SOLN
INTRAMUSCULAR | Status: AC
  Filled 2022-11-21: qty 2

## 2022-11-21 MED ORDER — HEPARIN SODIUM (PORCINE) 1000 UNIT/ML IJ SOLN
INTRAMUSCULAR | Status: DC | PRN
  Administered 2022-11-21: 8000 [IU] via INTRAVENOUS

## 2022-11-21 MED ORDER — SODIUM CHLORIDE 0.9% FLUSH: 3.0000 mL | INTRAVENOUS | Status: DC | PRN

## 2022-11-21 MED ORDER — LIDOCAINE HCL (PF) 1 % IJ SOLN
INTRAMUSCULAR | Status: AC
  Filled 2022-11-21: qty 30

## 2022-11-21 SURGICAL SUPPLY — 21 items
BALLN MUSTANG 5X80X135 (BALLOONS) ×2
BALLOON MUSTANG 5X80X135 (BALLOONS) IMPLANT
CATH NAVICROSS ANGLED 90CM (MICROCATHETER) IMPLANT
CATH OMNI FLUSH 5F 65CM (CATHETERS) IMPLANT
COVER DOME SNAP 22 D (MISCELLANEOUS) IMPLANT
GUIDEWIRE ANGLED .035X150CM (WIRE) IMPLANT
KIT ENCORE 26 ADVANTAGE (KITS) IMPLANT
KIT MICROPUNCTURE NIT STIFF (SHEATH) IMPLANT
KIT PV (KITS) ×3 IMPLANT
SHEATH CATAPULT 6FR 45 (SHEATH) IMPLANT
SHEATH PINNACLE 5F 10CM (SHEATH) IMPLANT
SHEATH PROBE COVER 6X72 (BAG) IMPLANT
STENT ELUVIA 6X80X130 (Permanent Stent) IMPLANT
STOPCOCK MORSE 400PSI 3WAY (MISCELLANEOUS) IMPLANT
SYR MEDRAD MARK 7 150ML (SYRINGE) ×3 IMPLANT
TRANSDUCER W/STOPCOCK (MISCELLANEOUS) ×3 IMPLANT
TRAY PV CATH (CUSTOM PROCEDURE TRAY) ×3 IMPLANT
TUBING CIL FLEX 10 FLL-RA (TUBING) IMPLANT
WIRE BENTSON .035X145CM (WIRE) IMPLANT
WIRE HI TORQ VERSACORE 300 (WIRE) IMPLANT
WIRE TORQFLEX AUST .018X40CM (WIRE) IMPLANT

## 2022-11-21 NOTE — Progress Notes (Signed)
Site area: Right a 6 french arterial sheath was removed  Site Prior to Removal:  Level 0  Pressure Applied For 30 MINUTES    Bedrest Beginning at 1250pm X 4 hours  Manual:   Yes.    Patient Status During Pull:  stable  Post Pull Groin Site:  Level 0  Post Pull Instructions Given:  Yes.    Post Pull Pulses Present:  Yes.    Dressing Applied:  Yes.    Comments:

## 2022-11-21 NOTE — Discharge Instructions (Signed)
Femoral Site Care This sheet gives you information about how to care for yourself after your procedure. Your health care provider may also give you more specific instructions. If you have problems or questions, contact your health care provider. What can I expect after the procedure?  After the procedure, it is common to have: Bruising that usually fades within 1-2 weeks. Tenderness at the site. Follow these instructions at home: Wound care Follow instructions from your health care provider about how to take care of your insertion site. Make sure you: Wash your hands with soap and water before you change your bandage (dressing). If soap and water are not available, use hand sanitizer. Remove your dressing as told by your health care provider. In 24 hours Do not take baths, swim, or use a hot tub until your health care provider approves. You may shower 24-48 hours after the procedure or as told by your health care provider. Gently wash the site with plain soap and water. Pat the area dry with a clean towel. Do not rub the site. This may cause bleeding. Do not apply powder or lotion to the site. Keep the site clean and dry. Check your femoral site every day for signs of infection. Check for: Redness, swelling, or pain. Fluid or blood. Warmth. Pus or a bad smell. Activity For the first 2-3 days after your procedure, or as long as directed: Avoid climbing stairs as much as possible. Do not squat. Do not lift anything that is heavier than 10 lb (4.5 kg), or the limit that you are told, until your health care provider says that it is safe. For 5 days Rest as directed. Avoid sitting for a long time without moving. Get up to take short walks every 1-2 hours. Do not drive for 24 hours if you were given a medicine to help you relax (sedative). General instructions Take over-the-counter and prescription medicines only as told by your health care provider. Keep all follow-up visits as told by  your health care provider. This is important. Contact a health care provider if you have: A fever or chills. You have redness, swelling, or pain around your insertion site. Get help right away if: The catheter insertion area swells very fast. You pass out. You suddenly start to sweat or your skin gets clammy. The catheter insertion area is bleeding, and the bleeding does not stop when you hold steady pressure on the area. The area near or just beyond the catheter insertion site becomes pale, cool, tingly, or numb. These symptoms may represent a serious problem that is an emergency. Do not wait to see if the symptoms will go away. Get medical help right away. Call your local emergency services (911 in the [place] Do not drive yourself to the hospital. Summary After the procedure, it is common to have bruising that usually fades within 1-2 weeks. Check your femoral site every day for signs of infection. Do not lift anything that is heavier than 10 lb (4.5 kg), or the limit that you are told, until your health care provider says that it is safe. This information is not intended to replace advice given to you by your health care provider. Make sure you discuss any questions you have with your health care provider. Document Revised: 07/16/2017 Document Reviewed: 07/16/2017 [employer] Patient Education  2020 [employer] 

## 2022-11-21 NOTE — Progress Notes (Signed)
Arrival From time is 1320

## 2022-11-21 NOTE — Interval H&P Note (Signed)
History and Physical Interval Note:  11/21/2022 8:56 AM  Lynnea Ferrier  has presented today for surgery, with the diagnosis of pad w/ claudication.  The various methods of treatment have been discussed with the patient and family. After consideration of risks, benefits and other options for treatment, the patient has consented to  Procedure(s): ABDOMINAL AORTOGRAM W/LOWER EXTREMITY (N/A) as a surgical intervention.  The patient's history has been reviewed, patient examined, no change in status, stable for surgery.  I have reviewed the patient's chart and labs.  Questions were answered to the patient's satisfaction.     Durene Cal

## 2022-11-21 NOTE — Op Note (Signed)
Patient name: [name] MRN: [id number] DOB: 05/26/1932 Sex: female  11/21/2022 Pre-operative Diagnosis: Left leg in-stent stenosis Post-operative diagnosis:  Same Surgeon:  Durene Cal Procedure Performed:  1.  Ultrasound-guided access, right femoral artery  2.  Aortobifemoral angiogram  3.  Left leg angiogram  4.  Stent left superficial femoral artery  5.  Conscious sedation, 30 minutes    Indications: The patient has previously undergone stenting of her left superficial femoral artery.  Surveillance imaging showed a high-grade stenosis within the stent.  She comes in today for further evaluation and possible intervention.  Procedure:  The patient was identified in the holding area and taken to room 8.  The patient was then placed supine on the table and prepped and draped in the usual sterile fashion.  A time out was called.  Conscious sedation was administered with the use of IV fentanyl and Versed under continuous physician and nurse monitoring.  Heart rate, blood pressure, and oxygen saturation were continuously monitored.  Total sedation time was 30 minutes.  Ultrasound was used to evaluate the right common femoral artery.  It was patent .  A digital ultrasound image was acquired.  A micropuncture needle was used to access the right common femoral artery under ultrasound guidance.  An 018 wire was advanced without resistance and a micropuncture sheath was placed.  The 018 wire was removed and a benson wire was placed.  The micropuncture sheath was exchanged for a 5 french sheath.  An omniflush catheter was advanced over the wire to the level of L-1.  An abdominal angiogram was obtained.  Next, using the omniflush catheter and a benson wire, the aortic bifurcation was crossed and the catheter was placed into theleft external iliac artery and left runoff was obtained.  Findings:   Aortogram: No significant renal artery stenosis was visualized.  The infrarenal abdominal aorta is widely  patent.  Bilateral common and external iliac arteries are calcified without significant stenosis.  The visualized portions of bilateral common femoral arteries are widely patent.   Left Lower Extremity: The left common femoral and profundofemoral artery are widely patent.  The superficial femoral artery is patent.  In the proximal third there is a stent with approximately 60% stenosis at the distal edge of the stent.  The popliteal artery is widely patent.  There is runoff via the anterior tibial artery which crosses the ankle.  Intervention: After the above images were acquired the decision was made to proceed with intervention.  A 6 French catapult sheath was advanced into the left external iliac artery.  The patient was fully heparinized.  Next using a Marshia Ly cross catheter and a versa core wire, the lesion was successfully crossed.  I elected to primarily stent this because there was disease extending outside of the previously placed stent.  I selected a 6 x 80 Eluvia with approximately 1 cm distal overlap of the previous stent.  This was deployed and then postdilated with a 5 mm balloon.  Completion imaging showed resolution of the stenosis.  The sheath was then withdrawn into the right extrailiac artery.  The wire was removed.  The patient was taken holding it for sheath pull.  She has significant scar tissue in her groin and a small hematoma from access issues and so I did not want to use a closure device.  Impression:  #1  60-70% in-stent stenosis within the left superficial femoral artery which extends beyond the distal edge of the stent.  This  was treated primarily with stenting using a 6 x 80 Eluvia with 1 cm overlap.  #2  Dominant runoff to the left leg is via the anterior tibial artery    V. Durene Cal, M.D., [facility] [place] Office: [phone] Pager:  [phone]

## 2022-11-22 ENCOUNTER — Telehealth: Payer: Self-pay

## 2022-11-22 NOTE — Telephone Encounter (Signed)
Pt called with questions.  Reviewed pt's chart, returned call for clarification, no answer, lf vm.

## 2022-12-21 ENCOUNTER — Other Ambulatory Visit: Payer: Self-pay | Admitting: *Deleted

## 2022-12-21 DIAGNOSIS — I779 Disorder of arteries and arterioles, unspecified: Secondary | ICD-10-CM

## 2022-12-21 DIAGNOSIS — I70212 Atherosclerosis of native arteries of extremities with intermittent claudication, left leg: Secondary | ICD-10-CM

## 2022-12-21 DIAGNOSIS — I70213 Atherosclerosis of native arteries of extremities with intermittent claudication, bilateral legs: Secondary | ICD-10-CM

## 2022-12-22 DIAGNOSIS — H401233 Low-tension glaucoma, bilateral, severe stage: Secondary | ICD-10-CM | POA: Diagnosis not present

## 2023-01-01 ENCOUNTER — Ambulatory Visit (INDEPENDENT_AMBULATORY_CARE_PROVIDER_SITE_OTHER)
Admission: RE | Admit: 2023-01-01 | Discharge: 2023-01-01 | Disposition: A | Discharge status: Home / Self Care | Payer: PPO | Source: Ambulatory Visit | Attending: Surgery | Admitting: Surgery

## 2023-01-01 ENCOUNTER — Ambulatory Visit (INDEPENDENT_AMBULATORY_CARE_PROVIDER_SITE_OTHER): Payer: PPO | Admitting: Physician Assistant

## 2023-01-01 ENCOUNTER — Ambulatory Visit (HOSPITAL_COMMUNITY)
Admission: RE | Admit: 2023-01-01 | Discharge: 2023-01-01 | Disposition: A | Discharge status: Home / Self Care | Payer: PPO | Source: Ambulatory Visit | Attending: Surgery | Admitting: Surgery

## 2023-01-01 VITALS — BP 173/66 | HR 64 | Temp 97.6°F | Resp 18 | Ht 68.0 in | Wt 177.6 lb

## 2023-01-01 DIAGNOSIS — I70212 Atherosclerosis of native arteries of extremities with intermittent claudication, left leg: Secondary | ICD-10-CM | POA: Diagnosis not present

## 2023-01-01 DIAGNOSIS — I779 Disorder of arteries and arterioles, unspecified: Secondary | ICD-10-CM | POA: Insufficient documentation

## 2023-01-01 DIAGNOSIS — I70213 Atherosclerosis of native arteries of extremities with intermittent claudication, bilateral legs: Secondary | ICD-10-CM | POA: Diagnosis not present

## 2023-01-01 LAB — VAS US ABI WITH/WO TBI: Left ABI: 0.98

## 2023-01-01 MED ORDER — CLOPIDOGREL BISULFATE 75 MG PO TABS: ORAL_TABLET | ORAL | 3 refills | Status: DC

## 2023-01-01 NOTE — Progress Notes (Signed)
Office Note     CC:  follow up Requesting Provider:  [name], MD  HPI: [name] is a 89 y.o. (12-20-1931) female who presents for follow up s/p Aortogram, left lower extremity Angiogram with left SFA stent by Dr. [name] on 11/21/22. This was performed due to surveillance imaging showing high grade recurrent in stent stenosis.  She did not have any associated claudication, rest pain or tissue loss. She reports doing well since her procedure. She is here with her friend today. She denies any pain on ambulation or rest, no non healing wounds. She says that on her own she stopped taking her Plavix because she is on full dose Aspirin.   She has had several LLE interventions as follows:  04/16/2018: Drug-coated balloon angioplasty, left superficial femoral artery, atherectomy, left peroneal artery, angioplasty left anterior tibial artery (ulcer)   09/03/2018: Stent, left superficial femoral artery, balloon angioplasty, left tibioperoneal trunk and peroneal artery (ulcer)  She has also undergone Atherectomy, right peroneal, superficial femoral, and peroneal artery with drug-coated balloon angioplasty to the right superficial femoral artery on 04/08/20 for an ulceration. Unfortunately she later required a right below knee amputation due to extent of osteomyelitis in her right foot by Dr. [name] in December of 2021. She has done well though since her amputation and is able to ambulate with her prosthesis. She does use rolling walker to assist her with ambulation.  She is medically managed on Aspirin, statin and Plavix She is diabetic She takes and ACEI and BB for hypertension She is a former smoker, quit in 1962  Past Medical History:  Diagnosis Date   Anxiety    Cellulitis 10/2015   LEFT FOOT   CHF (congestive heart failure) (HCC)    Complication of anesthesia    Coronary artery disease    Diabetes mellitus without complication (HCC)    insulin dependent   GERD (gastroesophageal  reflux disease)    Hypertension    Hypothyroidism    Neuromuscular disorder (HCC)    muscle cramps to lower extremities   Other primary cardiomyopathies    Peripheral vascular disease (HCC)    PONV (postoperative nausea and vomiting)    Shortness of breath     Past Surgical History:  Procedure Laterality Date   ABDOMINAL AORTOGRAM N/A 09/03/2018   Procedure: ABDOMINAL AORTOGRAM;  Surgeon: Nada Libman, MD;  Location: [facility];  Service: Cardiovascular;  Laterality: N/A;   ABDOMINAL AORTOGRAM W/LOWER EXTREMITY N/A 04/16/2018   Procedure: ABDOMINAL AORTOGRAM W/LOWER EXTREMITY;  Surgeon: Nada Libman, MD;  Location: [facility];  Service: Cardiovascular;  Laterality: N/A;  unilateral   ABDOMINAL AORTOGRAM W/LOWER EXTREMITY Bilateral 04/08/2020   Procedure: ABDOMINAL AORTOGRAM W/LOWER EXTREMITY;  Surgeon: Nada Libman, MD;  Location: [facility];  Service: Cardiovascular;  Laterality: Bilateral;   ABDOMINAL AORTOGRAM W/LOWER EXTREMITY N/A 11/21/2022   Procedure: ABDOMINAL AORTOGRAM W/LOWER EXTREMITY;  Surgeon: Nada Libman, MD;  Location: [facility];  Service: Cardiovascular;  Laterality: N/A;   ABDOMINAL HYSTERECTOMY     AMPUTATION Left 12/03/2018   Procedure: Left 3rd ray amputation;  Surgeon: Toni Arthurs, MD;  Location: [facility];  Service: Orthopedics;  Laterality: Left;    AMPUTATION Right 06/22/2020   Procedure: AMPUTATION BELOW KNEE;  Surgeon: Toni Arthurs, MD;  Location: [facility];  Service: Orthopedics;  Laterality: Right;   CHOLECYSTECTOMY     CORONARY ANGIOPLASTY WITH STENT PLACEMENT  08/09/2011  DES  to mid circumflex   I & D EXTREMITY Left 10/29/2015   Procedure: Irrigation and Debridement Left Foot;  Surgeon: Nadara Mustard, MD;  Location: [facility];  Service: Orthopedics;  Laterality: Left;   LEFT HEART CATHETERIZATION WITH CORONARY ANGIOGRAM N/A 08/08/2012   Procedure: LEFT HEART CATHETERIZATION WITH CORONARY ANGIOGRAM;   Surgeon: Corky Crafts, MD;  Location: [facility];  Service: Cardiovascular;  Laterality: N/A;   PERIPHERAL VASCULAR ATHERECTOMY  04/16/2018   Procedure: PERIPHERAL VASCULAR ATHERECTOMY;  Surgeon: Nada Libman, MD;  Location: [facility];  Service: Cardiovascular;;  lt. Peroneal   PERIPHERAL VASCULAR ATHERECTOMY Right 04/08/2020   Procedure: PERIPHERAL VASCULAR ATHERECTOMY;  Surgeon: Nada Libman, MD;  Location: [facility];  Service: Cardiovascular;  Laterality: Right;  SFA and Peroneal   PERIPHERAL VASCULAR BALLOON ANGIOPLASTY  04/16/2018   Procedure: PERIPHERAL VASCULAR BALLOON ANGIOPLASTY;  Surgeon: Nada Libman, MD;  Location: [facility];  Service: Cardiovascular;;  lt. sfa and AT   PERIPHERAL VASCULAR BALLOON ANGIOPLASTY Left 09/03/2018   Procedure: PERIPHERAL VASCULAR BALLOON ANGIOPLASTY;  Surgeon: Nada Libman, MD;  Location: [facility];  Service: Cardiovascular;  Laterality: Left;  TP TRUNK   PERIPHERAL VASCULAR BALLOON ANGIOPLASTY Right 04/08/2020   Procedure: PERIPHERAL VASCULAR BALLOON ANGIOPLASTY;  Surgeon: Nada Libman, MD;  Location: [facility];  Service: Cardiovascular;  Laterality: Right;  SFA (DCB), Peroneal   PERIPHERAL VASCULAR CATHETERIZATION N/A 12/30/2014   Procedure: Lower Extremity Angiography;  Surgeon: Iran Ouch, MD;  Location: [facility];  Service: Cardiovascular;  Laterality: N/A;   PERIPHERAL VASCULAR INTERVENTION Left 09/03/2018   Procedure: PERIPHERAL VASCULAR INTERVENTION;  Surgeon: Nada Libman, MD;  Location: [facility];  Service: Cardiovascular;  Laterality: Left;  SFA STENT    PERIPHERAL VASCULAR INTERVENTION  11/21/2022   Procedure: PERIPHERAL VASCULAR INTERVENTION;  Surgeon: Nada Libman, MD;  Location: [facility];  Service: Cardiovascular;;   TENDON RELEASE Right 03/11/2020   Procedure: Heel Cord Lengthening;  Surgeon: Toni Arthurs, MD;  Location: [facility];  Service: Orthopedics;  Laterality: Right;   THYROID SURGERY     radioactive iodine    TONSILLECTOMY     TRANSMETATARSAL AMPUTATION Right 03/11/2020   Procedure: Right foot transmetatarsal amputation;  Surgeon: Toni Arthurs, MD;  Location: [facility];  Service: Orthopedics;  Laterality: Right;    Social History   Socioeconomic History   Marital status: Married    Spouse name: Not on file   Number of children: Not on file   Years of education: Not on file   Highest education level: Not on file  Occupational History   Occupation: retired  Tobacco Use   Smoking status: Former    Types: Cigarettes    Quit date: 11/28/1960    Years since quitting: 62.1   Smokeless tobacco: Never  Vaping Use   Vaping Use: Never used  Substance and Sexual Activity   Alcohol use: Yes    Alcohol/week: 0.0 standard drinks of alcohol    Comment: rare   Drug use: No   Sexual activity: Not on file  Other Topics Concern   Not on file  Social History Narrative   Not on file   Social Determinants of Health   Financial Resource Strain: Not on file  Food Insecurity: No Food Insecurity (10/01/2019)   Hunger Vital Sign    Worried About Running Out of Food  in the Last Year: Never true    Ran Out of Food in the Last Year: Never true  Transportation Needs: No Transportation Needs (10/01/2019)   PRAPARE - Administrator, Civil Service (Medical): No    Lack of Transportation (Non-Medical): No  Physical Activity: Not on file  Stress: Not on file  Social Connections: Not on file  Intimate Partner Violence: Not on file    Family History  Problem Relation Age of Onset   Heart disease Father    Heart attack Father    Diabetes Sister    Heart disease Son        before age 61   Heart attack Grandchild        68yr old   Sudden death Grandchild    Hypertension Neg Hx     Current Outpatient Medications  Medication Sig Dispense Refill   aspirin EC 325 MG tablet  Take 325 mg by mouth at bedtime.     atorvastatin (LIPITOR) 20 MG tablet Take 1 tablet (20 mg total) by mouth daily. 30 tablet 11   BD PEN NEEDLE NANO 2ND GEN 32G X 4 MM MISC SMARTSIG:Injection Daily     bismuth subsalicylate (PEPTO BISMOL) 262 MG/15ML suspension Take 30 mLs by mouth every 6 (six) hours as needed for diarrhea or loose stools.     carvedilol (COREG) 25 MG tablet Take 1 tablet (25 mg total) by mouth 2 (two) times daily with a meal. *Please call and schedule an appointment with Dr Kirke Corin* (Patient taking differently: Take 25 mg by mouth at bedtime.) 60 tablet 0   Cholecalciferol (VITAMIN D) 50 MCG (2000 UT) tablet Take 2,000 Units by mouth daily.     Coenzyme Q10 (COQ-10) 400 MG CAPS Take 400 mg by mouth daily.     Continuous Blood Gluc Sensor (FREESTYLE LIBRE 2 SENSOR) MISC APPLY AS DIRECTED AND USE TO MONITOR BLOOD GLUCOSE CONTINUOUSLY. CHANGE EVERY 14 DAYS     Continuous Blood Gluc Sensor (FREESTYLE LIBRE 2 SENSOR) MISC APPLY AS DIRECTED AND USE TO MONITOR BLOOD GLUCOSE CONTINUOUSLY. CHANGE EVERY 14 DAYS     ergocalciferol (VITAMIN D2) 1.25 MG (50000 UT) capsule Take 50,000 Units by mouth every Friday.      ezetimibe (ZETIA) 10 MG tablet Take 10 mg by mouth daily.     FREESTYLE LITE test strip 2 (two) times daily as needed.     furosemide (LASIX) 40 MG tablet TAKE 1 TABLET(40 MG) BY MOUTH DAILY (Patient taking differently: Take 40 mg by mouth daily as needed for edema.) 90 tablet 2   glucose blood (FREESTYLE LITE) test strip USE TO CHECK BLOOD GLUCOSE TWICE DAILY AND AS NEEDED     Insulin Glargine (BASAGLAR KWIKPEN) 100 UNIT/ML Inject 60 Units into the skin at bedtime.     insulin lispro (HUMALOG) 100 UNIT/ML injection Inject 8-16 Units into the skin See admin instructions. Inject 8 units at breakfast, 16 units at lunch, and 16 units at diner     latanoprost (XALATAN) 0.005 % ophthalmic solution Place 1 drop into both eyes at bedtime.     lisinopril (PRINIVIL,ZESTRIL) 20 MG tablet  Take 20 mg by mouth every evening.      Multiple Minerals-Vitamins (CAL-MAG-ZINC-D PO) Take 1 tablet by mouth daily.     nitroGLYCERIN (NITROSTAT) 0.4 MG SL tablet DISSOLVE 1 TABLET UNDER THE TONGUE EVERY 5 MINUTES AS NEEDED FOR CHEST PAIN 25 tablet 8   pantoprazole (PROTONIX) 40 MG tablet TAKE 1 TABLET BY MOUTH  EVERY DAY AT 6 AM 90 tablet 2   PARoxetine (PAXIL) 40 MG tablet Take 40 mg by mouth at bedtime.      clopidogrel (PLAVIX) 75 MG tablet TAKE 1 TABLET(75 MG) BY MOUTH AT BEDTIME (Patient not taking: Reported on 01/01/2023) 30 tablet 3   No current facility-administered medications for this visit.    Allergies  Allergen Reactions   Sulfa Antibiotics Nausea And Vomiting    Severe vomiting.      REVIEW OF SYSTEMS:   [X]  denotes positive finding, [ ]  denotes negative finding Cardiac  Comments:  Chest pain or chest pressure:    Shortness of breath upon exertion:    Short of breath when lying flat:    Irregular heart rhythm:        Vascular    Pain in calf, thigh, or hip brought on by ambulation:    Pain in feet at night that wakes you up from your sleep:     Blood clot in your veins:    Leg swelling:         Pulmonary    Oxygen at home:    Productive cough:     Wheezing:         Neurologic    Sudden weakness in arms or legs:     Sudden numbness in arms or legs:     Sudden onset of difficulty speaking or slurred speech:    Temporary loss of vision in one eye:     Problems with dizziness:         Gastrointestinal    Blood in stool:     Vomited blood:         Genitourinary    Burning when urinating:     Blood in urine:        Psychiatric    Major depression:         Hematologic    Bleeding problems:    Problems with blood clotting too easily:        Skin    Rashes or ulcers:        Constitutional    Fever or chills:      PHYSICAL EXAMINATION:  Vitals:   01/01/23 0859  BP: (!) 173/66  Pulse: 64  Resp: 18  Temp: 97.6 F (36.4 C)  TempSrc: Temporal   SpO2: 96%  Weight: 177 lb 9.6 oz (80.6 kg)  Height: 5\' 8"  (1.727 m)    General:  WDWN in NAD; vital signs documented above Gait: ambulates with prosthesis and rolling walker HENT: WNL, normocephalic Pulmonary: normal non-labored breathing , without wheezing Cardiac: regular HR Abdomen: soft, NT, no masses Vascular Exam/Pulses: 2+ femoral pulses bilaterally, Doppler PT/ Pero/ Dp left foot Extremities: without ischemic changes, without Gangrene , without cellulitis; without open wounds;  Musculoskeletal: no muscle wasting or atrophy  Neurologic: A&O X 3 Psychiatric:  The pt has Normal affect.   Non-Invasive Vascular Imaging:   +-------+-----------+-----------+------------+------------+  ABI/TBIToday's ABIToday's TBIPrevious ABIPrevious TBI  +-------+-----------+-----------+------------+------------+  Right BKA        BKA        BKA         BKA           +-------+-----------+-----------+------------+------------+  Left  0.98       0.50       0.67        0.59          +-------+-----------+-----------+------------+------------+   Left Stent(s):  +---------------+--------+---------------+----------+--------+  SFA  PSV cm/sStenosis       Waveform  Comments  +---------------+--------+---------------+----------+--------+  Prox to Stent  100                    biphasic            +---------------+--------+---------------+----------+--------+  Proximal Stent 70                     biphasic            +---------------+--------+---------------+----------+--------+  Mid Stent      282     50-99% stenosisbiphasic            +---------------+--------+---------------+----------+--------+  Distal Stent   124                    biphasic            +---------------+--------+---------------+----------+--------+  Distal to Stent128                    monophasic          +---------------+--------+---------------+----------+--------+    Summary: Left: 50-99% stenosis is noted within the mid SFA stent.    ASSESSMENT/PLAN:: 89 y.o. female here for follow up for Aortogram, left lower extremity Angiogram with left SFA stent by Dr. [name] on 11/21/22. This was performed due to surveillance imaging showing high grade recurrent in stent stenosis. Her right femoral access site is without any swelling or hematoma. She is without any claudication, rest pain or tissue loss.  - ABI today shows improvement post intervention. Duplex today shows some residual stenosis in mid stent but velocities are improved compared to her pre intervention duplex - Recommend she resume taking her Plavix - Continue Aspirin and Statin  - Follow up in 6 months with ABI and LLE arterial duplex   Graceann Congress, PA-C [facility] [phone]  Clinic MD:   [name]

## 2023-01-10 ENCOUNTER — Other Ambulatory Visit: Payer: Self-pay

## 2023-01-10 DIAGNOSIS — I779 Disorder of arteries and arterioles, unspecified: Secondary | ICD-10-CM

## 2023-01-10 DIAGNOSIS — I70213 Atherosclerosis of native arteries of extremities with intermittent claudication, bilateral legs: Secondary | ICD-10-CM

## 2023-01-15 DIAGNOSIS — Z794 Long term (current) use of insulin: Secondary | ICD-10-CM | POA: Diagnosis not present

## 2023-01-15 DIAGNOSIS — F3342 Major depressive disorder, recurrent, in full remission: Secondary | ICD-10-CM | POA: Diagnosis not present

## 2023-01-15 DIAGNOSIS — I429 Cardiomyopathy, unspecified: Secondary | ICD-10-CM | POA: Diagnosis not present

## 2023-01-15 DIAGNOSIS — I5022 Chronic systolic (congestive) heart failure: Secondary | ICD-10-CM | POA: Diagnosis not present

## 2023-01-15 DIAGNOSIS — E785 Hyperlipidemia, unspecified: Secondary | ICD-10-CM | POA: Diagnosis not present

## 2023-01-15 DIAGNOSIS — S88111A Complete traumatic amputation at level between knee and ankle, right lower leg, initial encounter: Secondary | ICD-10-CM | POA: Diagnosis not present

## 2023-01-15 DIAGNOSIS — N182 Chronic kidney disease, stage 2 (mild): Secondary | ICD-10-CM | POA: Diagnosis not present

## 2023-01-15 DIAGNOSIS — I130 Hypertensive heart and chronic kidney disease with heart failure and stage 1 through stage 4 chronic kidney disease, or unspecified chronic kidney disease: Secondary | ICD-10-CM | POA: Diagnosis not present

## 2023-01-15 DIAGNOSIS — L0292 Furuncle, unspecified: Secondary | ICD-10-CM | POA: Diagnosis not present

## 2023-01-15 DIAGNOSIS — E1129 Type 2 diabetes mellitus with other diabetic kidney complication: Secondary | ICD-10-CM | POA: Diagnosis not present

## 2023-01-15 DIAGNOSIS — I2510 Atherosclerotic heart disease of native coronary artery without angina pectoris: Secondary | ICD-10-CM | POA: Diagnosis not present

## 2023-01-15 DIAGNOSIS — M8580 Other specified disorders of bone density and structure, unspecified site: Secondary | ICD-10-CM | POA: Diagnosis not present

## 2023-02-26 DIAGNOSIS — Z7982 Long term (current) use of aspirin: Secondary | ICD-10-CM | POA: Diagnosis not present

## 2023-02-26 DIAGNOSIS — I2510 Atherosclerotic heart disease of native coronary artery without angina pectoris: Secondary | ICD-10-CM | POA: Diagnosis not present

## 2023-02-26 DIAGNOSIS — F3342 Major depressive disorder, recurrent, in full remission: Secondary | ICD-10-CM | POA: Diagnosis not present

## 2023-02-26 DIAGNOSIS — Z8679 Personal history of other diseases of the circulatory system: Secondary | ICD-10-CM | POA: Diagnosis not present

## 2023-02-26 DIAGNOSIS — Z66 Do not resuscitate: Secondary | ICD-10-CM | POA: Diagnosis not present

## 2023-02-26 DIAGNOSIS — I110 Hypertensive heart disease with heart failure: Secondary | ICD-10-CM | POA: Diagnosis not present

## 2023-02-26 DIAGNOSIS — Z6828 Body mass index (BMI) 28.0-28.9, adult: Secondary | ICD-10-CM | POA: Diagnosis not present

## 2023-02-26 DIAGNOSIS — I5022 Chronic systolic (congestive) heart failure: Secondary | ICD-10-CM | POA: Diagnosis not present

## 2023-03-18 ENCOUNTER — Other Ambulatory Visit: Payer: Self-pay | Admitting: Interventional Cardiology

## 2023-04-26 DIAGNOSIS — I130 Hypertensive heart and chronic kidney disease with heart failure and stage 1 through stage 4 chronic kidney disease, or unspecified chronic kidney disease: Secondary | ICD-10-CM | POA: Diagnosis not present

## 2023-04-26 DIAGNOSIS — Z7189 Other specified counseling: Secondary | ICD-10-CM | POA: Diagnosis not present

## 2023-04-26 DIAGNOSIS — E1129 Type 2 diabetes mellitus with other diabetic kidney complication: Secondary | ICD-10-CM | POA: Diagnosis not present

## 2023-04-26 DIAGNOSIS — N182 Chronic kidney disease, stage 2 (mild): Secondary | ICD-10-CM | POA: Diagnosis not present

## 2023-04-26 DIAGNOSIS — Z794 Long term (current) use of insulin: Secondary | ICD-10-CM | POA: Diagnosis not present

## 2023-04-26 DIAGNOSIS — I2510 Atherosclerotic heart disease of native coronary artery without angina pectoris: Secondary | ICD-10-CM | POA: Diagnosis not present

## 2023-05-01 DIAGNOSIS — H90A32 Mixed conductive and sensorineural hearing loss, unilateral, left ear with restricted hearing on the contralateral side: Secondary | ICD-10-CM | POA: Diagnosis not present

## 2023-05-08 ENCOUNTER — Ambulatory Visit: Payer: PPO | Admitting: Interventional Cardiology

## 2023-05-16 DIAGNOSIS — Z89511 Acquired absence of right leg below knee: Secondary | ICD-10-CM | POA: Diagnosis not present

## 2023-06-01 ENCOUNTER — Other Ambulatory Visit: Payer: Self-pay

## 2023-06-01 MED ORDER — CLOPIDOGREL BISULFATE 75 MG PO TABS: ORAL_TABLET | ORAL | 11 refills | Status: DC

## 2023-06-16 ENCOUNTER — Other Ambulatory Visit: Payer: Self-pay | Admitting: Interventional Cardiology

## 2023-06-20 ENCOUNTER — Other Ambulatory Visit: Payer: Self-pay | Admitting: *Deleted

## 2023-06-22 ENCOUNTER — Other Ambulatory Visit: Payer: Self-pay | Admitting: Interventional Cardiology

## 2023-06-22 MED ORDER — PANTOPRAZOLE SODIUM 40 MG PO TBEC: DELAYED_RELEASE_TABLET | ORAL | 0 refills | Status: DC

## 2023-07-02 ENCOUNTER — Ambulatory Visit (HOSPITAL_COMMUNITY)
Admission: RE | Admit: 2023-07-02 | Discharge: 2023-07-02 | Disposition: A | Discharge status: Home / Self Care | Payer: PPO | Source: Ambulatory Visit | Attending: Surgery | Admitting: Surgery

## 2023-07-02 ENCOUNTER — Ambulatory Visit (INDEPENDENT_AMBULATORY_CARE_PROVIDER_SITE_OTHER)
Admission: RE | Admit: 2023-07-02 | Discharge: 2023-07-02 | Disposition: A | Discharge status: Home / Self Care | Payer: PPO | Source: Ambulatory Visit | Attending: Surgery | Admitting: Surgery

## 2023-07-02 ENCOUNTER — Ambulatory Visit (INDEPENDENT_AMBULATORY_CARE_PROVIDER_SITE_OTHER): Payer: PPO | Admitting: Physician Assistant

## 2023-07-02 VITALS — BP 193/71 | HR 65 | Temp 97.2°F | Resp 24 | Ht 68.0 in | Wt 181.0 lb

## 2023-07-02 DIAGNOSIS — I779 Disorder of arteries and arterioles, unspecified: Secondary | ICD-10-CM

## 2023-07-02 DIAGNOSIS — I70213 Atherosclerosis of native arteries of extremities with intermittent claudication, bilateral legs: Secondary | ICD-10-CM

## 2023-07-02 LAB — VAS US ABI WITH/WO TBI: Left ABI: 0.8

## 2023-07-02 NOTE — Progress Notes (Signed)
Office Note     CC:  follow up Requesting Provider:  [name], MD  HPI: [name] is a 89 y.o. (Dec 29, 1931) female who presents for routine follow up of PAD. She most recently underwent Angiogram with left SFA stenting in May of this year by Dr. [name]. She had recurrent in stent stenosis that was identified on surveillance duplex. She had no associated symptoms.   Today she reports that she overall has been doing very well. She denies any pain in her legs on ambulation or rest. No tissue loss. She says she does scrape her skin very easily but reports that her skin is very thin in her "old age". She has done well though since her amputation and is able to ambulate with her prosthesis. She does use rolling walker to assist her with ambulation.She is medically managed on Aspirin, statin, Plavix   Her vascular interventions are as follows: 04/16/2018: Drug-coated balloon angioplasty, left superficial femoral artery, atherectomy, left peroneal artery, angioplasty left anterior tibial artery for ulceration 09/03/2018: Stent, left superficial femoral artery, balloon angioplasty, left tibioperoneal trunk and peroneal artery for ulceration 04/08/20 Atherectomy, right peroneal, superficial femoral, and peroneal artery with drug-coated balloon angioplasty to the right superficial femoral artery onfor an ulceration 11/21/22 Angiogram, with angioplasty of left SFA stent for recurrent in stent restenosis   She has history of right below knee amputation due to extent of osteomyelitis in her right foot by Dr. [name] in December of 2021.    She is medically managed on Aspirin, statin and Plavix She is diabetic She takes and ACEI and BB for hypertension She is a former smoker, quit in 1962  Past Medical History:  Diagnosis Date   Anxiety    Cellulitis 10/2015   LEFT FOOT   CHF (congestive heart failure) (HCC)    Complication of anesthesia    Coronary artery disease    Diabetes mellitus  without complication (HCC)    insulin dependent   GERD (gastroesophageal reflux disease)    Hypertension    Hypothyroidism    Neuromuscular disorder (HCC)    muscle cramps to lower extremities   Other primary cardiomyopathies    Peripheral vascular disease (HCC)    PONV (postoperative nausea and vomiting)    Shortness of breath     Past Surgical History:  Procedure Laterality Date   ABDOMINAL AORTOGRAM N/A 09/03/2018   Procedure: ABDOMINAL AORTOGRAM;  Surgeon: Nada Libman, MD;  Location: [facility];  Service: Cardiovascular;  Laterality: N/A;   ABDOMINAL AORTOGRAM W/LOWER EXTREMITY N/A 04/16/2018   Procedure: ABDOMINAL AORTOGRAM W/LOWER EXTREMITY;  Surgeon: Nada Libman, MD;  Location: [facility];  Service: Cardiovascular;  Laterality: N/A;  unilateral   ABDOMINAL AORTOGRAM W/LOWER EXTREMITY Bilateral 04/08/2020   Procedure: ABDOMINAL AORTOGRAM W/LOWER EXTREMITY;  Surgeon: Nada Libman, MD;  Location: [facility];  Service: Cardiovascular;  Laterality: Bilateral;   ABDOMINAL AORTOGRAM W/LOWER EXTREMITY N/A 11/21/2022   Procedure: ABDOMINAL AORTOGRAM W/LOWER EXTREMITY;  Surgeon: Nada Libman, MD;  Location: [facility];  Service: Cardiovascular;  Laterality: N/A;   ABDOMINAL HYSTERECTOMY     AMPUTATION Left 12/03/2018   Procedure: Left 3rd ray amputation;  Surgeon: Toni Arthurs, MD;  Location: [facility];  Service: Orthopedics;  Laterality: Left;    AMPUTATION Right 06/22/2020   Procedure: AMPUTATION BELOW KNEE;  Surgeon: Toni Arthurs, MD;  Location: [facility];  Service: Orthopedics;  Laterality: Right;   CHOLECYSTECTOMY  CORONARY ANGIOPLASTY WITH STENT PLACEMENT  08/09/2011   DES  to mid circumflex   I & D EXTREMITY Left 10/29/2015   Procedure: Irrigation and Debridement Left Foot;  Surgeon: Nadara Mustard, MD;  Location: [facility];  Service: Orthopedics;  Laterality: Left;   LEFT HEART CATHETERIZATION WITH CORONARY ANGIOGRAM N/A  08/08/2012   Procedure: LEFT HEART CATHETERIZATION WITH CORONARY ANGIOGRAM;  Surgeon: Corky Crafts, MD;  Location: [facility];  Service: Cardiovascular;  Laterality: N/A;   PERIPHERAL VASCULAR ATHERECTOMY  04/16/2018   Procedure: PERIPHERAL VASCULAR ATHERECTOMY;  Surgeon: Nada Libman, MD;  [facility]: [facility];  Service: Cardiovascular;;  lt. Peroneal   PERIPHERAL VASCULAR ATHERECTOMY Right 04/08/2020   Procedure: PERIPHERAL VASCULAR ATHERECTOMY;  Surgeon: Nada Libman, MD;  [facility]: [facility];  Service: Cardiovascular;  Laterality: Right;  SFA and Peroneal   PERIPHERAL VASCULAR BALLOON ANGIOPLASTY  04/16/2018   Procedure: PERIPHERAL VASCULAR BALLOON ANGIOPLASTY;  Surgeon: Nada Libman, MD;  [facility]: [facility];  Service: Cardiovascular;;  lt. sfa and AT   PERIPHERAL VASCULAR BALLOON ANGIOPLASTY Left 09/03/2018   Procedure: PERIPHERAL VASCULAR BALLOON ANGIOPLASTY;  Surgeon: Nada Libman, MD;  [facility]: [facility];  Service: Cardiovascular;  Laterality: Left;  TP TRUNK   PERIPHERAL VASCULAR BALLOON ANGIOPLASTY Right 04/08/2020   Procedure: PERIPHERAL VASCULAR BALLOON ANGIOPLASTY;  Surgeon: Nada Libman, MD;  [facility]: [facility];  Service: Cardiovascular;  Laterality: Right;  SFA (DCB), Peroneal   PERIPHERAL VASCULAR CATHETERIZATION N/A 12/30/2014   Procedure: Lower Extremity Angiography;  Surgeon: Iran Ouch, MD;  [facility]: [facility];  Service: Cardiovascular;  Laterality: N/A;   PERIPHERAL VASCULAR INTERVENTION Left 09/03/2018   Procedure: PERIPHERAL VASCULAR INTERVENTION;  Surgeon: Nada Libman, MD;  [facility]: [facility];  Service: Cardiovascular;  Laterality: Left;  SFA STENT    PERIPHERAL VASCULAR INTERVENTION  11/21/2022   Procedure: PERIPHERAL VASCULAR INTERVENTION;  Surgeon: Nada Libman, MD;  Location: [facility];  Service: Cardiovascular;;   TENDON RELEASE Right 03/11/2020   Procedure:  Heel Cord Lengthening;  Surgeon: Toni Arthurs, MD;  Location: [facility];  Service: Orthopedics;  Laterality: Right;   THYROID SURGERY     radioactive iodine    TONSILLECTOMY     TRANSMETATARSAL AMPUTATION Right 03/11/2020   Procedure: Right foot transmetatarsal amputation;  Surgeon: Toni Arthurs, MD;  Location: [facility];  Service: Orthopedics;  Laterality: Right;    Social History   Socioeconomic History   Marital status: Married    Spouse name: Not on file   Number of children: Not on file   Years of education: Not on file   Highest education level: Not on file  Occupational History   Occupation: retired  Tobacco Use   Smoking status: Former    Current packs/day: 0.00    Types: Cigarettes    Quit date: 11/28/1960    Years since quitting: 62.6   Smokeless tobacco: Never  Vaping Use   Vaping status: Never Used  Substance and Sexual Activity   Alcohol use: Yes    Alcohol/week: 0.0 standard drinks of alcohol    Comment: rare   Drug use: No   Sexual activity: Not on file  Other Topics Concern   Not on file  Social History Narrative   Not on file   Social Drivers of Health   Financial Resource Strain: Not on file  Food Insecurity: No Food Insecurity (  10/01/2019)   Hunger Vital Sign    Worried About Running Out of Food in the Last Year: Never true    Ran Out of Food in the Last Year: Never true  Transportation Needs: No Transportation Needs (10/01/2019)   PRAPARE - Administrator, Civil Service (Medical): No    Lack of Transportation (Non-Medical): No  Physical Activity: Not on file  Stress: Not on file  Social Connections: Not on file  Intimate Partner Violence: Not on file    Family History  Problem Relation Age of Onset   Heart disease Father    Heart attack Father    Diabetes Sister    Heart disease Son        before age 22   Heart attack Grandchild        70yr old   Sudden death Grandchild    Hypertension Neg Hx      Current Outpatient Medications  Medication Sig Dispense Refill   aspirin EC 325 MG tablet Take 325 mg by mouth at bedtime.     atorvastatin (LIPITOR) 20 MG tablet Take 1 tablet (20 mg total) by mouth daily. 30 tablet 11   BD PEN NEEDLE NANO 2ND GEN 32G X 4 MM MISC SMARTSIG:Injection Daily     bismuth subsalicylate (PEPTO BISMOL) 262 MG/15ML suspension Take 30 mLs by mouth every 6 (six) hours as needed for diarrhea or loose stools.     carvedilol (COREG) 25 MG tablet Take 1 tablet (25 mg total) by mouth 2 (two) times daily with a meal. *Please call and schedule an appointment with Dr Kirke Corin* (Patient taking differently: Take 25 mg by mouth at bedtime.) 60 tablet 0   Cholecalciferol (VITAMIN D) 50 MCG (2000 UT) tablet Take 2,000 Units by mouth daily.     clopidogrel (PLAVIX) 75 MG tablet TAKE 1 TABLET(75 MG) BY MOUTH AT BEDTIME 30 tablet 11   Coenzyme Q10 (COQ-10) 400 MG CAPS Take 400 mg by mouth daily.     Continuous Blood Gluc Sensor (FREESTYLE LIBRE 2 SENSOR) MISC APPLY AS DIRECTED AND USE TO MONITOR BLOOD GLUCOSE CONTINUOUSLY. CHANGE EVERY 14 DAYS     Continuous Blood Gluc Sensor (FREESTYLE LIBRE 2 SENSOR) MISC APPLY AS DIRECTED AND USE TO MONITOR BLOOD GLUCOSE CONTINUOUSLY. CHANGE EVERY 14 DAYS     ergocalciferol (VITAMIN D2) 1.25 MG (50000 UT) capsule Take 50,000 Units by mouth every Friday.      ezetimibe (ZETIA) 10 MG tablet Take 10 mg by mouth daily.     FREESTYLE LITE test strip 2 (two) times daily as needed.     furosemide (LASIX) 40 MG tablet TAKE 1 TABLET(40 MG) BY MOUTH DAILY (Patient taking differently: Take 40 mg by mouth daily as needed for edema.) 90 tablet 2   glucose blood (FREESTYLE LITE) test strip USE TO CHECK BLOOD GLUCOSE TWICE DAILY AND AS NEEDED     Insulin Glargine (BASAGLAR KWIKPEN) 100 UNIT/ML Inject 60 Units into the skin at bedtime.     insulin lispro (HUMALOG) 100 UNIT/ML injection Inject 8-16 Units into the skin See admin instructions. Inject 8 units at  breakfast, 16 units at lunch, and 16 units at diner     latanoprost (XALATAN) 0.005 % ophthalmic solution Place 1 drop into both eyes at bedtime.     lisinopril (PRINIVIL,ZESTRIL) 20 MG tablet Take 20 mg by mouth every evening.      Multiple Minerals-Vitamins (CAL-MAG-ZINC-D PO) Take 1 tablet by mouth daily.     nitroGLYCERIN (NITROSTAT)  0.4 MG SL tablet DISSOLVE 1 TABLET UNDER THE TONGUE EVERY 5 MINUTES AS NEEDED FOR CHEST PAIN 25 tablet 8   pantoprazole (PROTONIX) 40 MG tablet TAKE 1 TABLET BY MOUTH DAILY AT 6 A.M. 90 tablet 0   PARoxetine (PAXIL) 40 MG tablet Take 40 mg by mouth at bedtime.      No current facility-administered medications for this visit.    Allergies  Allergen Reactions   Sulfa Antibiotics Nausea And Vomiting    Severe vomiting.      REVIEW OF SYSTEMS:  [X]  denotes positive finding, [ ]  denotes negative finding Cardiac  Comments:  Chest pain or chest pressure:    Shortness of breath upon exertion:    Short of breath when lying flat:    Irregular heart rhythm:        Vascular    Pain in calf, thigh, or hip brought on by ambulation:    Pain in feet at night that wakes you up from your sleep:     Blood clot in your veins:    Leg swelling:         Pulmonary    Oxygen at home:    Productive cough:     Wheezing:         Neurologic    Sudden weakness in arms or legs:     Sudden numbness in arms or legs:     Sudden onset of difficulty speaking or slurred speech:    Temporary loss of vision in one eye:     Problems with dizziness:         Gastrointestinal    Blood in stool:     Vomited blood:         Genitourinary    Burning when urinating:     Blood in urine:        Psychiatric    Major depression:         Hematologic    Bleeding problems:    Problems with blood clotting too easily:        Skin    Rashes or ulcers:        Constitutional    Fever or chills:      PHYSICAL EXAMINATION:  There were no vitals filed for this  visit.  General:  WDWN in NAD; vital signs documented above Gait: ambulates with RLE prosthesis, and rolling walker HENT: WNL, normocephalic Pulmonary: normal non-labored breathing , without wheezing Cardiac: regular HR Abdomen: soft Vascular Exam/Pulses: 2+ femoral pulses bilaterally, 1+ palpable Dp, foot warm and well perfused Extremities: without ischemic changes, without Gangrene , without cellulitis; without open wounds;  Musculoskeletal: no muscle wasting or atrophy  Neurologic: A&O X 3 Psychiatric:  The pt has Normal affect.   Non-Invasive Vascular Imaging:   +-------+-----------+-----------+------------+------------+  ABI/TBIToday's ABIToday's TBIPrevious ABIPrevious TBI  +-------+-----------+-----------+------------+------------+  Right BKA        BKA        BKA         BKA           +-------+-----------+-----------+------------+------------+  Left  0.80       0.47       0.98        0.50          +-------+-----------+-----------+------------+------------+  Left ABIs appear decreased compared to prior study on 01/01/23.   VAS US Arterial Duplex Left: +-----------+--------+-----+---------------+----------+----------+  LEFT      PSV cm/sRatioStenosis       Waveform  Comments    +-----------+--------+-----+---------------+----------+----------+  CFA Mid    99                          biphasic              +-----------+--------+-----+---------------+----------+----------+  DFA       124                         biphasic              +-----------+--------+-----+---------------+----------+----------+  SFA Prox   96                          biphasic              +-----------+--------+-----+---------------+----------+----------+  SFA Mid    327          50-74% stenosismonophasicmid-distal  +-----------+--------+-----+---------------+----------+----------+  SFA Distal 202          50-74% stenosismonophasic             +-----------+--------+-----+---------------+----------+----------+  POP Prox   61                                                +-----------+--------+-----+---------------+----------+----------+  ATA Distal 89                          biphasic              +-----------+--------+-----+---------------+----------+----------+  PTA Distal 13                          monophasic            +-----------+--------+-----+---------------+----------+----------+  PERO Distal32                          monophasic            +-----------+--------+-----+---------------+----------+----------+   Left Stent(s):  +---------------+--------+--------+--------+--------+  SFA           PSV cm/sStenosisWaveformComments  +---------------+--------+--------+--------+--------+  Prox to Stent  112             biphasic          +---------------+--------+--------+--------+--------+  Proximal Stent 86              biphasic          +---------------+--------+--------+--------+--------+  Mid Stent      90              biphasic          +---------------+--------+--------+--------+--------+  Distal Stent   74              biphasic          +---------------+--------+--------+--------+--------+  Distal to Stent108             biphasic          +---------------+--------+--------+--------+--------+   Summary:  Left: 50-74% stenosis noted in the superficial femoral artery. Patent stent with no evidence of stenosis in the superficial femoral artery.    ASSESSMENT/PLAN:: 89 y.o. female here for routine follow up of PAD. She most recently underwent Angiogram with left SFA stenting in May of this year by Dr. [name]. She is currently without any claudication,  rest pain or tissue loss. She ambulates well with her right BKA prosthesis and rolling walker.  - ABI today showed slight decrease from 6 months ago. TBI stable - Duplex shows patent left SFA stent. She does  have some elevated velocities in the middle and distal SFA - Continue Aspirin, statin, Plavix - Encourage her to walk more - At this time recommend close interval follow up in 6 months with repeat LLE arterial duplex and ABI. She knows to call for earlier follow up if any new or concerning symptoms  Graceann Congress, PA-C [facility] [phone]  On Call MD:   Karin Lieu

## 2023-07-05 ENCOUNTER — Other Ambulatory Visit: Payer: Self-pay

## 2023-07-05 DIAGNOSIS — I779 Disorder of arteries and arterioles, unspecified: Secondary | ICD-10-CM

## 2023-07-06 DIAGNOSIS — H903 Sensorineural hearing loss, bilateral: Secondary | ICD-10-CM | POA: Diagnosis not present

## 2023-07-24 ENCOUNTER — Ambulatory Visit: Payer: PPO | Admitting: Cardiovascular Disease

## 2023-08-07 DIAGNOSIS — H401233 Low-tension glaucoma, bilateral, severe stage: Secondary | ICD-10-CM | POA: Diagnosis not present

## 2023-08-07 DIAGNOSIS — H401133 Primary open-angle glaucoma, bilateral, severe stage: Secondary | ICD-10-CM | POA: Diagnosis not present

## 2023-08-08 DIAGNOSIS — E042 Nontoxic multinodular goiter: Secondary | ICD-10-CM | POA: Diagnosis not present

## 2023-08-08 DIAGNOSIS — I130 Hypertensive heart and chronic kidney disease with heart failure and stage 1 through stage 4 chronic kidney disease, or unspecified chronic kidney disease: Secondary | ICD-10-CM | POA: Diagnosis not present

## 2023-08-08 DIAGNOSIS — E785 Hyperlipidemia, unspecified: Secondary | ICD-10-CM | POA: Diagnosis not present

## 2023-08-08 DIAGNOSIS — E0590 Thyrotoxicosis, unspecified without thyrotoxic crisis or storm: Secondary | ICD-10-CM | POA: Diagnosis not present

## 2023-08-08 DIAGNOSIS — I5032 Chronic diastolic (congestive) heart failure: Secondary | ICD-10-CM | POA: Diagnosis not present

## 2023-08-08 DIAGNOSIS — E1129 Type 2 diabetes mellitus with other diabetic kidney complication: Secondary | ICD-10-CM | POA: Diagnosis not present

## 2023-08-08 DIAGNOSIS — E1140 Type 2 diabetes mellitus with diabetic neuropathy, unspecified: Secondary | ICD-10-CM | POA: Diagnosis not present

## 2023-08-08 DIAGNOSIS — D649 Anemia, unspecified: Secondary | ICD-10-CM | POA: Diagnosis not present

## 2023-08-08 DIAGNOSIS — N182 Chronic kidney disease, stage 2 (mild): Secondary | ICD-10-CM | POA: Diagnosis not present

## 2023-08-13 DIAGNOSIS — S88111A Complete traumatic amputation at level between knee and ankle, right lower leg, initial encounter: Secondary | ICD-10-CM | POA: Diagnosis not present

## 2023-08-13 DIAGNOSIS — E1159 Type 2 diabetes mellitus with other circulatory complications: Secondary | ICD-10-CM | POA: Diagnosis not present

## 2023-08-13 DIAGNOSIS — Z794 Long term (current) use of insulin: Secondary | ICD-10-CM | POA: Diagnosis not present

## 2023-08-13 DIAGNOSIS — I10 Essential (primary) hypertension: Secondary | ICD-10-CM | POA: Diagnosis not present

## 2023-08-13 DIAGNOSIS — Z1339 Encounter for screening examination for other mental health and behavioral disorders: Secondary | ICD-10-CM | POA: Diagnosis not present

## 2023-08-13 DIAGNOSIS — E1129 Type 2 diabetes mellitus with other diabetic kidney complication: Secondary | ICD-10-CM | POA: Diagnosis not present

## 2023-08-13 DIAGNOSIS — E785 Hyperlipidemia, unspecified: Secondary | ICD-10-CM | POA: Diagnosis not present

## 2023-08-13 DIAGNOSIS — E1140 Type 2 diabetes mellitus with diabetic neuropathy, unspecified: Secondary | ICD-10-CM | POA: Diagnosis not present

## 2023-08-13 DIAGNOSIS — I130 Hypertensive heart and chronic kidney disease with heart failure and stage 1 through stage 4 chronic kidney disease, or unspecified chronic kidney disease: Secondary | ICD-10-CM | POA: Diagnosis not present

## 2023-08-13 DIAGNOSIS — N182 Chronic kidney disease, stage 2 (mild): Secondary | ICD-10-CM | POA: Diagnosis not present

## 2023-08-13 DIAGNOSIS — Z0000 Encounter for general adult medical examination without abnormal findings: Secondary | ICD-10-CM | POA: Diagnosis not present

## 2023-08-13 DIAGNOSIS — M8580 Other specified disorders of bone density and structure, unspecified site: Secondary | ICD-10-CM | POA: Diagnosis not present

## 2023-08-13 DIAGNOSIS — I5022 Chronic systolic (congestive) heart failure: Secondary | ICD-10-CM | POA: Diagnosis not present

## 2023-08-13 DIAGNOSIS — M67449 Ganglion, unspecified hand: Secondary | ICD-10-CM | POA: Diagnosis not present

## 2023-08-13 DIAGNOSIS — Z1331 Encounter for screening for depression: Secondary | ICD-10-CM | POA: Diagnosis not present

## 2023-08-23 ENCOUNTER — Encounter: Payer: Self-pay | Admitting: Cardiovascular Disease

## 2023-08-23 NOTE — Progress Notes (Signed)
 Cardiology Office Note:  .   Date:  08/24/2023  ID:  [name], DOB 1932/03/01, MRN [id number] PCP: Jeanette Elsie [name], MD  [facility] Providers Cardiologist:  Candyce Reek, MD    History of Present Illness: .    Feb. 7, 2025  [name] is a 89 y.o. female , former patient of Dr. Reek I am meeting her for the first time today  Seen with granddaughter , [name].  Hx of HTN, HLD , CAD   Hx of LCx stenting in Jan. 2014.  Hx of PTCA of OM1. CTO of her RCA with good collaterals  S/p atherectomy and angioplasty of superficial femoral artery  stenosis She has a goiter   Has DM PAD , Has a right BKA   No recent chest pain ,   Walks with walker,  Lives independently , cooks her own food.    Has mild MS and moderate MR by echo in 2021     ROS:   Studies Reviewed: [name]       EKG Interpretation Date/Time:  Friday August 24 2023 11:26:02 EST Ventricular Rate:  60 PR Interval:  184 QRS Duration:  158 QT Interval:  508 QTC Calculation: 508 R Axis:   -70  Text Interpretation: Normal sinus rhythm Left axis deviation Right bundle branch block Minimal voltage criteria for LVH, may be normal variant ( R in aVL ) Marked T-wave abnormality, consider inferolateral ischemia When compared with ECG of 12-Mar-2020 13:07, Right bundle branch block is now Present The ST / T wave abnormality are more pronounced. The patient has no symptoms of ischemia .   I suspect the pronouced ST / T wave abnormaliteis are due to the new RBBB . Confirmed by Alveta Mungo [phone]) on 08/24/2023 5:55:54 PM    Risk Assessment/Calculations:             Physical Exam:   VS:  BP (!) 124/54   Pulse (!) 57   Ht 5' 8 (1.727 m)   Wt 175 lb 6.4 oz (79.6 kg)   SpO2 96%   BMI 26.67 kg/m    Wt Readings from Last 3 Encounters:  08/24/23 175 lb 6.4 oz (79.6 kg)  07/02/23 181 lb (82.1 kg)  01/01/23 177 lb 9.6 oz (80.6 kg)    GEN: Well nourished, well developed in no acute  distress NECK: No JVD; No carotid bruits CARDIAC: RR . Soft systolic murmur under left breast c/w MR  RESPIRATORY:  Clear to auscultation without rales, wheezing or rhonchi  ABDOMEN: Soft, non-tender, non-distended EXTREMITIES:  s/p right BKE with prosthetic lower leg.   1+ pitting edema with chronic stasis changes in the left   ASSESSMENT AND PLAN: .   1.  Coronary artery disease: She is doing well.  She has not had any episodes of chest discomfort.  Will continue current medications.  Her EKG shows a new right bundle branch block and marked ST and T wave changes.  She is not having any symptoms and I suspect that these ST and T wave changes are due to the new right bundle branch block.  She had a left bundle type configuration last time.  She will need to be watched closely for conduction issues.  2.  Mitral regurgitation.  She also has moderate mitral regurgitation and mild mitral stenosis.  At age 47 I doubt that she will ever have any significant problems with her mitral valve  3.  Peripheral arterial disease: She has  diabetes with diabetic PAD.  She is status post right BKA.  She follows with [facility].        Dispo: 1 year    Signed, Aleene Passe, MD

## 2023-08-24 ENCOUNTER — Encounter: Payer: Self-pay | Admitting: Cardiovascular Disease

## 2023-08-24 ENCOUNTER — Ambulatory Visit: Payer: PPO | Attending: Cardiovascular Disease | Admitting: Cardiovascular Disease

## 2023-08-24 VITALS — BP 124/54 | HR 57 | Ht 68.0 in | Wt 175.4 lb

## 2023-08-24 DIAGNOSIS — I25119 Atherosclerotic heart disease of native coronary artery with unspecified angina pectoris: Secondary | ICD-10-CM | POA: Diagnosis not present

## 2023-08-24 NOTE — Patient Instructions (Signed)
 Follow-Up: At [facility], you and your health needs are our priority.  As part of our continuing mission to provide you with exceptional heart care, we have created designated Provider Care Teams.  These Care Teams include your primary Cardiologist (physician) and Advanced Practice Providers (APPs -  Physician Assistants and Nurse Practitioners) who all work together to provide you with the care you need, when you need it.  We recommend signing up for the patient portal called "MyChart".  Sign up information is provided on this After Visit Summary.  MyChart is used to connect with patients for Virtual Visits (Telemedicine).  Patients are able to view lab/test results, encounter notes, upcoming appointments, etc.  Non-urgent messages can be sent to your provider as well.   To learn more about what you can do with MyChart, go to [email or web address].    Your next appointment:   1 year(s)  Provider:   Provider  Other Instructions   1st Floor: - Lobby - Registration  - Pharmacy  - Lab - Cafe  2nd Floor: - PV Lab - Diagnostic Testing (echo, CT, nuclear med)  3rd Floor: - Vacant  4th Floor: - TCTS (cardiothoracic surgery) - AFib Clinic - Structural Heart Clinic - Vascular Surgery  - Vascular Ultrasound  5th Floor: - HeartCare Cardiology (general and EP) - Clinical Pharmacy for coumadin, hypertension, lipid, weight-loss medications, and med management appointments    Valet parking services will be available as well.

## 2023-08-28 DIAGNOSIS — N390 Urinary tract infection, site not specified: Secondary | ICD-10-CM | POA: Diagnosis not present

## 2023-08-28 DIAGNOSIS — R319 Hematuria, unspecified: Secondary | ICD-10-CM | POA: Diagnosis not present

## 2023-09-20 ENCOUNTER — Other Ambulatory Visit: Payer: Self-pay | Admitting: Cardiovascular Disease

## 2023-10-02 DIAGNOSIS — G5601 Carpal tunnel syndrome, right upper limb: Secondary | ICD-10-CM | POA: Diagnosis not present

## 2023-10-02 DIAGNOSIS — M13841 Other specified arthritis, right hand: Secondary | ICD-10-CM | POA: Diagnosis not present

## 2023-11-12 DIAGNOSIS — H401133 Primary open-angle glaucoma, bilateral, severe stage: Secondary | ICD-10-CM | POA: Diagnosis not present

## 2023-11-12 DIAGNOSIS — H401233 Low-tension glaucoma, bilateral, severe stage: Secondary | ICD-10-CM | POA: Diagnosis not present

## 2023-11-15 ENCOUNTER — Other Ambulatory Visit: Payer: Self-pay | Admitting: Surgery

## 2023-11-16 NOTE — Telephone Encounter (Signed)
 Follow up with PCP

## 2023-12-03 ENCOUNTER — Other Ambulatory Visit: Payer: Self-pay | Admitting: Interventional Cardiology

## 2023-12-22 DIAGNOSIS — H401233 Low-tension glaucoma, bilateral, severe stage: Secondary | ICD-10-CM | POA: Diagnosis not present

## 2024-01-07 ENCOUNTER — Encounter (HOSPITAL_COMMUNITY): Payer: PPO

## 2024-01-07 ENCOUNTER — Ambulatory Visit: Payer: PPO

## 2024-01-09 DIAGNOSIS — N182 Chronic kidney disease, stage 2 (mild): Secondary | ICD-10-CM | POA: Diagnosis not present

## 2024-01-09 DIAGNOSIS — I130 Hypertensive heart and chronic kidney disease with heart failure and stage 1 through stage 4 chronic kidney disease, or unspecified chronic kidney disease: Secondary | ICD-10-CM | POA: Diagnosis not present

## 2024-01-09 DIAGNOSIS — I2510 Atherosclerotic heart disease of native coronary artery without angina pectoris: Secondary | ICD-10-CM | POA: Diagnosis not present

## 2024-01-09 DIAGNOSIS — E1129 Type 2 diabetes mellitus with other diabetic kidney complication: Secondary | ICD-10-CM | POA: Diagnosis not present

## 2024-01-09 DIAGNOSIS — I5022 Chronic systolic (congestive) heart failure: Secondary | ICD-10-CM | POA: Diagnosis not present

## 2024-01-09 DIAGNOSIS — Z794 Long term (current) use of insulin: Secondary | ICD-10-CM | POA: Diagnosis not present

## 2024-01-24 DIAGNOSIS — H53143 Visual discomfort, bilateral: Secondary | ICD-10-CM | POA: Diagnosis not present

## 2024-01-24 DIAGNOSIS — H35371 Puckering of macula, right eye: Secondary | ICD-10-CM | POA: Diagnosis not present

## 2024-01-24 DIAGNOSIS — H43393 Other vitreous opacities, bilateral: Secondary | ICD-10-CM | POA: Diagnosis not present

## 2024-01-24 DIAGNOSIS — E119 Type 2 diabetes mellitus without complications: Secondary | ICD-10-CM | POA: Diagnosis not present

## 2024-02-04 ENCOUNTER — Encounter (HOSPITAL_COMMUNITY)

## 2024-02-04 ENCOUNTER — Ambulatory Visit

## 2024-02-18 DIAGNOSIS — N182 Chronic kidney disease, stage 2 (mild): Secondary | ICD-10-CM | POA: Diagnosis not present

## 2024-02-18 DIAGNOSIS — I739 Peripheral vascular disease, unspecified: Secondary | ICD-10-CM | POA: Diagnosis not present

## 2024-02-18 DIAGNOSIS — E1122 Type 2 diabetes mellitus with diabetic chronic kidney disease: Secondary | ICD-10-CM | POA: Diagnosis not present

## 2024-02-18 DIAGNOSIS — E042 Nontoxic multinodular goiter: Secondary | ICD-10-CM | POA: Diagnosis not present

## 2024-02-18 DIAGNOSIS — I2510 Atherosclerotic heart disease of native coronary artery without angina pectoris: Secondary | ICD-10-CM | POA: Diagnosis not present

## 2024-02-18 DIAGNOSIS — Z794 Long term (current) use of insulin: Secondary | ICD-10-CM | POA: Diagnosis not present

## 2024-02-18 DIAGNOSIS — I5022 Chronic systolic (congestive) heart failure: Secondary | ICD-10-CM | POA: Diagnosis not present

## 2024-02-18 DIAGNOSIS — I429 Cardiomyopathy, unspecified: Secondary | ICD-10-CM | POA: Diagnosis not present

## 2024-02-18 DIAGNOSIS — I130 Hypertensive heart and chronic kidney disease with heart failure and stage 1 through stage 4 chronic kidney disease, or unspecified chronic kidney disease: Secondary | ICD-10-CM | POA: Diagnosis not present

## 2024-02-18 DIAGNOSIS — F3342 Major depressive disorder, recurrent, in full remission: Secondary | ICD-10-CM | POA: Diagnosis not present

## 2024-02-18 DIAGNOSIS — E785 Hyperlipidemia, unspecified: Secondary | ICD-10-CM | POA: Diagnosis not present

## 2024-02-18 DIAGNOSIS — S88111A Complete traumatic amputation at level between knee and ankle, right lower leg, initial encounter: Secondary | ICD-10-CM | POA: Diagnosis not present

## 2024-03-05 ENCOUNTER — Other Ambulatory Visit: Payer: Self-pay | Admitting: Surgery

## 2024-03-13 ENCOUNTER — Ambulatory Visit (HOSPITAL_BASED_OUTPATIENT_CLINIC_OR_DEPARTMENT_OTHER)
Admission: RE | Admit: 2024-03-13 | Discharge: 2024-03-13 | Discharge status: Home / Self Care | Source: Ambulatory Visit | Attending: Surgery | Admitting: Surgery

## 2024-03-13 ENCOUNTER — Ambulatory Visit (HOSPITAL_COMMUNITY)
Admission: RE | Admit: 2024-03-13 | Discharge: 2024-03-13 | Disposition: A | Discharge status: Home / Self Care | Source: Ambulatory Visit | Attending: Surgery | Admitting: Surgery

## 2024-03-13 DIAGNOSIS — I779 Disorder of arteries and arterioles, unspecified: Secondary | ICD-10-CM | POA: Insufficient documentation

## 2024-03-13 LAB — VAS US ABI WITH/WO TBI: Left ABI: 0.98

## 2024-03-18 ENCOUNTER — Other Ambulatory Visit: Payer: Self-pay | Admitting: Surgery

## 2024-03-25 ENCOUNTER — Other Ambulatory Visit: Payer: Self-pay | Admitting: Surgery

## 2024-04-21 ENCOUNTER — Telehealth: Payer: Self-pay

## 2024-04-21 NOTE — Telephone Encounter (Signed)
 Patient called asking if her f/u appt on 04/28/24 could be a PHONE appt.  1230 Called pt, no answer, left message 1456 Called pt, no answer, left message  1500 Patient called back, per Sam Rhyne,PA, a PA PHONE appt should be fine given her test results. Patient reported she is having no new symptoms at this time.  Patient is aware that the timing of the phone call may not be exactly the time of her appt and knows to expect a phone call on 10/13.

## 2024-04-28 ENCOUNTER — Ambulatory Visit: Attending: Surgery | Admitting: Physician Assistant

## 2024-04-28 ENCOUNTER — Encounter (HOSPITAL_COMMUNITY)

## 2024-04-28 ENCOUNTER — Encounter: Payer: Self-pay | Admitting: Physician Assistant

## 2024-04-28 DIAGNOSIS — I779 Disorder of arteries and arterioles, unspecified: Secondary | ICD-10-CM | POA: Diagnosis not present

## 2024-04-28 NOTE — Progress Notes (Signed)
 Virtual Visit via Telephone Note   I connected with [name] on 04/28/2024 by telephone. Patient was located at home. I am located at [facility] office.   The limitations of evaluation and management by telemedicine and the availability of in person appointments have been previously discussed with the patient and are documented in the patients chart. The patient expressed understanding and consented to proceed.  PCP: Loreli Elsie [name], MD  Chief Complaint: follow up  History of Present Illness: [name] is a 89 y.o. female with hx of: 04/16/2018: Drug-coated balloon angioplasty, left superficial femoral artery, atherectomy, left peroneal artery, angioplasty left anterior tibial artery for ulceration 09/03/2018: Stent, left superficial femoral artery, balloon angioplasty, left tibioperoneal trunk and peroneal artery for ulceration 04/08/20 Atherectomy, right peroneal, superficial femoral, and peroneal artery with drug-coated balloon angioplasty to the right superficial femoral artery onfor an ulceration 11/21/22 Angiogram, with angioplasty of left SFA stent for recurrent in stent restenosis   She has history of right below knee amputation due to extent of osteomyelitis in her right foot by Dr. Kit in December of 2021.   Pt states that her left leg is doing well.  She does not have any claudication, rest pain or non healing wounds.  She states she was told by Dr. Dann that she would not be a candidate for any surgery. She has been compliant with her asa/statin/plavix .   The pt is on a statin for cholesterol management.  The pt is on a daily aspirin .   Other AC:  plavix  The pt is on ACEI, diuretic for hypertension.   The pt is diabetic.   Tobacco hx:  former   Past Medical History:  Diagnosis Date   Anxiety    Cellulitis 10/2015   LEFT FOOT   CHF (congestive heart failure) (HCC)    Complication of anesthesia    Coronary artery disease    Diabetes mellitus without  complication (HCC)    insulin  dependent   GERD (gastroesophageal reflux disease)    Hypertension    Hypothyroidism    Neuromuscular disorder (HCC)    muscle cramps to lower extremities   Other primary cardiomyopathies    Peripheral vascular disease    PONV (postoperative nausea and vomiting)    Shortness of breath     Past Surgical History:  Procedure Laterality Date   ABDOMINAL AORTOGRAM N/A 09/03/2018   Procedure: ABDOMINAL AORTOGRAM;  Surgeon: Serene Gaile ORN, MD;  [facility]: [facility];  Service: Cardiovascular;  Laterality: N/A;   ABDOMINAL AORTOGRAM W/LOWER EXTREMITY N/A 04/16/2018   Procedure: ABDOMINAL AORTOGRAM W/LOWER EXTREMITY;  Surgeon: Serene Gaile ORN, MD;  Location: [facility];  Service: Cardiovascular;  Laterality: N/A;  unilateral   ABDOMINAL AORTOGRAM W/LOWER EXTREMITY Bilateral 04/08/2020   Procedure: ABDOMINAL AORTOGRAM W/LOWER EXTREMITY;  Surgeon: Serene Gaile ORN, MD;  Location: [facility];  Service: Cardiovascular;  Laterality: Bilateral;   ABDOMINAL AORTOGRAM W/LOWER EXTREMITY N/A 11/21/2022   Procedure: ABDOMINAL AORTOGRAM W/LOWER EXTREMITY;  Surgeon: Serene Gaile ORN, MD;  Location: [facility];  Service: Cardiovascular;  Laterality: N/A;   ABDOMINAL HYSTERECTOMY     AMPUTATION Left 12/03/2018   Procedure: Left 3rd ray amputation;  Surgeon: Kit Rush, MD;  Location: [facility];  Service: Orthopedics;  Laterality: Left;    AMPUTATION Right 06/22/2020   Procedure: AMPUTATION BELOW KNEE;  Surgeon: Kit Rush, MD;  Location: [facility];  Service: Orthopedics;  Laterality: Right;   CHOLECYSTECTOMY  CORONARY ANGIOPLASTY WITH STENT PLACEMENT  08/09/2011   DES  to mid circumflex   I & D EXTREMITY Left 10/29/2015   Procedure: Irrigation and Debridement Left Foot;  Surgeon: Jerona Harden GAILS, MD;  Location: [facility];  Service: Orthopedics;  Laterality: Left;   LEFT HEART CATHETERIZATION WITH CORONARY ANGIOGRAM N/A 08/08/2012    Procedure: LEFT HEART CATHETERIZATION WITH CORONARY ANGIOGRAM;  Surgeon: Candyce GORMAN Reek, MD;  Location: [facility];  Service: Cardiovascular;  Laterality: N/A;   PERIPHERAL VASCULAR ATHERECTOMY  04/16/2018   Procedure: PERIPHERAL VASCULAR ATHERECTOMY;  Surgeon: Serene Gaile ORN, MD;  [facility]: [facility];  Service: Cardiovascular;;  lt. Peroneal   PERIPHERAL VASCULAR ATHERECTOMY Right 04/08/2020   Procedure: PERIPHERAL VASCULAR ATHERECTOMY;  Surgeon: Serene Gaile ORN, MD;  [facility]: [facility];  Service: Cardiovascular;  Laterality: Right;  SFA and Peroneal   PERIPHERAL VASCULAR BALLOON ANGIOPLASTY  04/16/2018   Procedure: PERIPHERAL VASCULAR BALLOON ANGIOPLASTY;  Surgeon: Serene Gaile ORN, MD;  [facility];  Service: Cardiovascular;;  lt. sfa and AT   PERIPHERAL VASCULAR BALLOON ANGIOPLASTY Left 09/03/2018   Procedure: PERIPHERAL VASCULAR BALLOON ANGIOPLASTY;  Surgeon: Serene Gaile ORN, MD;  [facility];  Service: Cardiovascular;  Laterality: Left;  TP TRUNK   PERIPHERAL VASCULAR BALLOON ANGIOPLASTY Right 04/08/2020   Procedure: PERIPHERAL VASCULAR BALLOON ANGIOPLASTY;  Surgeon: Serene Gaile ORN, MD;  [facility];  Service: Cardiovascular;  Laterality: Right;  SFA (DCB), Peroneal   PERIPHERAL VASCULAR CATHETERIZATION N/A 12/30/2014   Procedure: Lower Extremity Angiography;  Surgeon: Deatrice DELENA Cage, MD;  [facility]: [facility];  Service: Cardiovascular;  Laterality: N/A;   PERIPHERAL VASCULAR INTERVENTION Left 09/03/2018   Procedure: PERIPHERAL VASCULAR INTERVENTION;  Surgeon: Serene Gaile ORN, MD;  [facility];  Service: Cardiovascular;  Laterality: Left;  SFA STENT    PERIPHERAL VASCULAR INTERVENTION  11/21/2022   Procedure: PERIPHERAL VASCULAR INTERVENTION;  Surgeon: Serene Gaile ORN, MD;  [facility]: [facility];  Service: Cardiovascular;;   TENDON RELEASE Right 03/11/2020   Procedure: Heel Cord  Lengthening;  Surgeon: Kit Rush, MD;  Location: [facility];  Service: Orthopedics;  Laterality: Right;   THYROID  SURGERY     radioactive iodine     TONSILLECTOMY     TRANSMETATARSAL AMPUTATION Right 03/11/2020   Procedure: Right foot transmetatarsal amputation;  Surgeon: Kit Rush, MD;  Location: [facility];  Service: Orthopedics;  Laterality: Right;    shoulder  No outpatient medications have been marked as taking for the 04/28/24 encounter (Appointment) with [facility].      Observations/Objective: +-----------+--------+-----+---------------+----------+--------+  LEFT      PSV cm/sRatioStenosis       Waveform  Comments  +-----------+--------+-----+---------------+----------+--------+  CFA Prox   101                         biphasic            +-----------+--------+-----+---------------+----------+--------+  CFA Distal 96                          biphasic            +-----------+--------+-----+---------------+----------+--------+  DFA       101                         biphasic            +-----------+--------+-----+---------------+----------+--------+  SFA Prox   137                         biphasic            +-----------+--------+-----+---------------+----------+--------+  SFA Mid    217          50-74% stenosisbiphasic            +-----------+--------+-----+---------------+----------+--------+  SFA Distal 136                         biphasic            +-----------+--------+-----+---------------+----------+--------+  POP Prox   92                          biphasic            +-----------+--------+-----+---------------+----------+--------+  POP Mid    52                          monophasic          +-----------+--------+-----+---------------+----------+--------+  POP Distal 60                          biphasic  dampened   +-----------+--------+-----+---------------+----------+--------+  TP Trunk   76                          biphasic            +-----------+--------+-----+---------------+----------+--------+  PTA Distal 12                          monophasic          +-----------+--------+-----+---------------+----------+--------+  PERO Distal                                      NV        +-----------+--------+-----+---------------+----------+--------+     Left Stent(s):  +---------------+--------+--------+--------+--------+  SFA           PSV cm/sStenosisWaveformComments  +---------------+--------+--------+--------+--------+  Prox to Stent  62              biphasic          +---------------+--------+--------+--------+--------+  Proximal Stent 76              biphasic          +---------------+--------+--------+--------+--------+  Mid Stent      60              biphasic          +---------------+--------+--------+--------+--------+  Distal Stent   145             biphasic          +---------------+--------+--------+--------+--------+  Distal to [id number]             biphasic          +---------------+--------+--------+--------+--------+   +-------+-----------+-----------+------------+------------+  ABI/TBIToday's ABIToday's TBIPrevious ABIPrevious TBI  +-------+-----------+-----------+------------+------------+  Right BKA                   BKA                       +-------+-----------+-----------+------------+------------+  Left  0.98  0.48       0.80        0.47          +-------+-----------+-----------+------------+------------+   Summary:  Left: 50-74% stenosis noted in the superficial femoral artery. Patent stent with no visualized stenosis.    Assessment and Plan: 89 y.o. female with hx of: 04/16/2018: Drug-coated balloon angioplasty, left superficial femoral artery, atherectomy, left peroneal artery,  angioplasty left anterior tibial artery for ulceration 09/03/2018: Stent, left superficial femoral artery, balloon angioplasty, left tibioperoneal trunk and peroneal artery for ulceration 04/08/20 Atherectomy, right peroneal, superficial femoral, and peroneal artery with drug-coated balloon angioplasty to the right superficial femoral artery onfor an ulceration 11/21/22 Angiogram, with angioplasty of left SFA stent for recurrent in stent restenosis   She has history of right below knee amputation due to extent of osteomyelitis in her right foot by Dr. Kit in December of 2021.  -she does have a mild stenosis in the left mid SFA. Pt is asymptomatic and she is doing well without claudication, rest pain or non healing wounds.  She has been compliant with her asa and statin.    Follow Up Instructions:   Follow up in 1 year(s) with ABI and LLE arterial duplex.  She knows to call sooner if she has any issues before then -continue asa/statin -discussed that as long as she is asymptomatic, we would not intervene. She expressed good understanding.    I discussed the assessment and treatment plan with the patient. The patient was provided an opportunity to ask questions and all were answered. The patient agreed with the plan and demonstrated an understanding of the instructions.   The patient was advised to call back or seek an in-person evaluation if the symptoms worsen or if the condition fails to improve as anticipated.  I spent 5 minutes with the patient via telephone encounter and 10 minutes reviewing chart/meds and PMH.   Signed, Lucie Apt, PA-C [facility] Office: [phone]  04/28/2024, 8:38 AM

## 2024-05-31 ENCOUNTER — Other Ambulatory Visit: Payer: Self-pay | Admitting: Vascular Surgery

## 2024-06-04 DIAGNOSIS — I2510 Atherosclerotic heart disease of native coronary artery without angina pectoris: Secondary | ICD-10-CM | POA: Diagnosis not present

## 2024-06-04 DIAGNOSIS — N182 Chronic kidney disease, stage 2 (mild): Secondary | ICD-10-CM | POA: Diagnosis not present

## 2024-06-04 DIAGNOSIS — Z794 Long term (current) use of insulin: Secondary | ICD-10-CM | POA: Diagnosis not present

## 2024-06-04 DIAGNOSIS — I130 Hypertensive heart and chronic kidney disease with heart failure and stage 1 through stage 4 chronic kidney disease, or unspecified chronic kidney disease: Secondary | ICD-10-CM | POA: Diagnosis not present

## 2024-06-04 DIAGNOSIS — E1140 Type 2 diabetes mellitus with diabetic neuropathy, unspecified: Secondary | ICD-10-CM | POA: Diagnosis not present

## 2024-06-04 DIAGNOSIS — R634 Abnormal weight loss: Secondary | ICD-10-CM | POA: Diagnosis not present

## 2024-06-29 ENCOUNTER — Emergency Department (HOSPITAL_COMMUNITY)
Admission: EM | Admit: 2024-06-29 | Discharge: 2024-06-29 | Disposition: A | Discharge status: Home / Self Care | Attending: Emergency Medicine | Admitting: Emergency Medicine

## 2024-06-29 ENCOUNTER — Emergency Department (HOSPITAL_COMMUNITY)

## 2024-06-29 ENCOUNTER — Other Ambulatory Visit: Payer: Self-pay

## 2024-06-29 ENCOUNTER — Encounter (HOSPITAL_COMMUNITY): Payer: Self-pay

## 2024-06-29 DIAGNOSIS — N3289 Other specified disorders of bladder: Secondary | ICD-10-CM

## 2024-06-29 DIAGNOSIS — N3001 Acute cystitis with hematuria: Secondary | ICD-10-CM

## 2024-06-29 DIAGNOSIS — R310 Gross hematuria: Secondary | ICD-10-CM

## 2024-06-29 LAB — BASIC METABOLIC PANEL WITH GFR
Anion gap: 7 (ref 5–15)
BUN: 15 mg/dL (ref 8–23)
CO2: 26 mmol/L (ref 22–32)
Calcium: 8.5 mg/dL — ABNORMAL LOW (ref 8.9–10.3)
Chloride: 108 mmol/L (ref 98–111)
Creatinine, Ser: 0.7 mg/dL (ref 0.44–1.00)
GFR, Estimated: >60 mL/min (ref >60)
Glucose, Bld: 115 mg/dL — ABNORMAL HIGH (ref 70–99)
Potassium: 4.2 mmol/L (ref 3.5–5.1)
Sodium: 141 mmol/L (ref 135–145)

## 2024-06-29 LAB — CBC WITH DIFFERENTIAL/PLATELET
Abs Immature Granulocytes: 0.02 K/uL (ref 0.00–0.07)
Basophils Absolute: 0 K/uL (ref 0.0–0.1)
Basophils Relative: 1 %
Eosinophils Absolute: 0.3 K/uL (ref 0.0–0.5)
Eosinophils Relative: 4 %
HCT: 37 % (ref 36.0–46.0)
Hemoglobin: 11.7 g/dL — ABNORMAL LOW (ref 12.0–15.0)
Immature Granulocytes: 0 %
Lymphocytes Relative: 16 %
Lymphs Abs: 1.4 K/uL (ref 0.7–4.0)
MCH: 29.2 pg (ref 26.0–34.0)
MCHC: 31.6 g/dL (ref 30.0–36.0)
MCV: 92.3 fL (ref 80.0–100.0)
Monocytes Absolute: 0.7 K/uL (ref 0.1–1.0)
Monocytes Relative: 8 %
Neutro Abs: 6.1 K/uL (ref 1.7–7.7)
Neutrophils Relative %: 71 %
Platelets: 308 K/uL (ref 150–400)
RBC: 4.01 MIL/uL (ref 3.87–5.11)
RDW: 14 % (ref 11.5–15.5)
WBC: 8.6 K/uL (ref 4.0–10.5)
nRBC: 0 % (ref 0.0–0.2)

## 2024-06-29 LAB — URINALYSIS, ROUTINE W REFLEX MICROSCOPIC
Bilirubin Urine: NEGATIVE
Glucose, UA: NEGATIVE mg/dL
Ketones, ur: 15 mg/dL — AB
Nitrite: POSITIVE — AB
Protein, ur: >300 mg/dL — AB
Specific Gravity, Urine: 1.02 (ref 1.005–1.030)
pH: 6.5 (ref 5.0–8.0)

## 2024-06-29 LAB — URINALYSIS, MICROSCOPIC (REFLEX): RBC / HPF: >50 RBC/hpf (ref 0–5)

## 2024-06-29 MED ORDER — SODIUM CHLORIDE 0.9 % IV SOLN
1.0000 g | Freq: Once | INTRAVENOUS | Status: AC
  Administered 2024-06-29: 1 g via INTRAVENOUS
  Filled 2024-06-29 (×2): qty 10

## 2024-06-29 MED ORDER — ACETAMINOPHEN 325 MG PO TABS
650.0000 mg | ORAL_TABLET | Freq: Once | ORAL | Status: AC
  Administered 2024-06-29: 650 mg via ORAL
  Filled 2024-06-29: qty 2

## 2024-06-29 MED ORDER — CEFADROXIL 500 MG PO CAPS: 500.0000 mg | ORAL_CAPSULE | Freq: Two times a day (BID) | ORAL | 0 refills | Status: DC

## 2024-06-29 NOTE — ED Notes (Signed)
 Pt assisted to the bathroom with a wheelchair. NT attempted to obtain urine sample, but was not able to collect it. Pt's brief changed. Pt back in bed, hooked up to monitor, and is aware to let us  know when she needs to use the bathroom again. Call bell within reach.

## 2024-06-29 NOTE — ED Notes (Signed)
 Pt assisted to the bathroom with a wheelchair. Urine sample collected and sent to lab. Pt returned to bed and hooked up to monitor. Call bell within reach.

## 2024-06-29 NOTE — ED Triage Notes (Signed)
 Pt states hematuria for 5 days. C/O weakness. Denies bloody stools. Axox4.

## 2024-06-29 NOTE — Discharge Instructions (Addendum)
 Please take the entire course of antibiotics that prescribed, call the urologist in the morning to schedule a follow-up appointment for cystoscopy to further evaluate the noted bladder mass on your CT scan from today.

## 2024-06-29 NOTE — ED Notes (Signed)
 Pt unable to give urine sample at this time. Urine cup provided

## 2024-06-29 NOTE — ED Provider Triage Note (Signed)
 Emergency Medicine Provider Triage Evaluation Note  [name] , a 89 y.o. female  was evaluated in triage.  Pt complains of hematuria and weakness that has been going on for the last 4 to 5 days.  Patient also reports dysuria for the same amount of time.  She denies any abdominal pain, back pain, fevers, chills, body aches.  Patient denies any dizziness or shortness of breath.  Patient states that she thought the bleeding would improve, but it has persisted for the last 4 to 5 days, prompting her visit today  Review of Systems  Positive: Hematuria, dysuria Negative: Shortness of breath, dizziness  Physical Exam  BP (!) 150/68   Pulse 97   Temp 98.4 F (36.9 C)   Resp 14   SpO2 97%  Gen:   Awake, no distress   Resp:  Normal effort  MSK:   Moves extremities without difficulty.  Patient does have a right-sided BKA, with a prosthetic Other:  Abdomen is nontender.  No CVA tenderness  Medical Decision Making  Medically screening exam initiated at 11:21 AM.  Appropriate orders placed.  ISOBEL EISENHUTH was informed that the remainder of the evaluation will be completed by another provider, this initial triage assessment does not replace that evaluation, and the importance of remaining in the ED until their evaluation is complete.  Labs, UA, CT ordered.   Torrence Marry RAMAN, PA-C 06/29/24 1125

## 2024-06-29 NOTE — ED Provider Notes (Signed)
 [place] EMERGENCY DEPARTMENT AT [facility] Provider Note   CSN: [id number] Arrival date & time: 06/29/24  1112     Patient presents with: No chief complaint on file.   SATOYA FEELEY is a 89 y.o. female with past medical history seen for diabetes, CHF, CAD, peripheral vascular disease status post right AKA who presents concern for hematuria for 5 days, some generalized weakness, no bloody stools.  She endorses feeling generally sore.  She does endorse dysuria with urination.  No history of kidney stones or frequent UTIs.   HPI     Prior to Admission medications  Medication Sig Start Date End Date Taking? Authorizing Provider  aspirin  EC 325 MG tablet Take 325 mg by mouth at bedtime.    [provider]  atorvastatin  (LIPITOR) 20 MG tablet Take 1 tablet (20 mg total) by mouth daily. 11/21/22 11/21/23  Serene Gaile ORN, MD  BD PEN NEEDLE NANO 2ND GEN 32G X 4 MM MISC SMARTSIG:Injection Daily 12/08/20   [provider]  bismuth  subsalicylate (PEPTO BISMOL) 262 MG/15ML suspension Take 30 mLs by mouth every 6 (six) hours as needed for diarrhea or loose stools.    [provider]  carvedilol  (COREG ) 25 MG tablet Take 1 tablet (25 mg total) by mouth 2 (two) times daily with a meal. *Please call and schedule an appointment with Dr Darron* Patient taking differently: Take 25 mg by mouth at bedtime. 07/18/16   Darron Deatrice LABOR, MD  Cholecalciferol (VITAMIN D) 50 MCG (2000 UT) tablet Take 2,000 Units by mouth daily.    [provider]  clopidogrel  (PLAVIX ) 75 MG tablet TAKE 1 TABLET BY MOUTH AT BEDTIME 06/02/24   Sheree Penne Bruckner, MD  Coenzyme Q10 (COQ-10) 400 MG CAPS Take 400 mg by mouth daily.    [provider]  Continuous Blood Gluc Sensor (FREESTYLE LIBRE 2 SENSOR) MISC APPLY AS DIRECTED AND USE TO MONITOR BLOOD GLUCOSE CONTINUOUSLY. CHANGE EVERY 14 DAYS 01/30/21   [provider]  Continuous Blood Gluc Sensor (FREESTYLE LIBRE  2 SENSOR) MISC APPLY AS DIRECTED AND USE TO MONITOR BLOOD GLUCOSE CONTINUOUSLY. CHANGE EVERY 14 DAYS    [provider]  ergocalciferol (VITAMIN D2) 1.25 MG (50000 UT) capsule Take 50,000 Units by mouth every Friday.     [provider]  ezetimibe (ZETIA) 10 MG tablet Take 10 mg by mouth daily.    [provider]  FREESTYLE LITE test strip 2 (two) times daily as needed. 12/16/20   [provider]  furosemide  (LASIX ) 40 MG tablet TAKE 1 TABLET(40 MG) BY MOUTH DAILY Patient taking differently: Take 40 mg by mouth daily as needed for edema. 05/10/22   Dann Candyce RAMAN, MD  glucose blood (FREESTYLE LITE) test strip USE TO CHECK BLOOD GLUCOSE TWICE DAILY AND AS NEEDED    [provider]  Insulin  Glargine (BASAGLAR  KWIKPEN) 100 UNIT/ML Inject 60 Units into the skin at bedtime.    [provider]  insulin  lispro (HUMALOG) 100 UNIT/ML injection Inject 8-16 Units into the skin See admin instructions. Inject 8 units at breakfast, 16 units at lunch, and 16 units at diner    [provider]  latanoprost (XALATAN) 0.005 % ophthalmic solution Place 1 drop into both eyes at bedtime. 09/05/22   [provider]  lisinopril  (PRINIVIL ,ZESTRIL ) 20 MG tablet Take 20 mg by mouth every evening.  10/29/15   [provider]  Multiple Minerals-Vitamins (CAL-MAG-ZINC-D PO) Take 1 tablet by mouth daily.  [provider]  nitroGLYCERIN  (NITROSTAT ) 0.4 MG SL tablet DISSOLVE 1 TAB UNDER TONGUE FOR CHEST PAIN - IF PAIN REMAINS AFTER 5 MIN, CALL 911 AND REPEAT DOSE. MAX 3 TABS IN 15 MINUTES 12/03/23   Nahser, Aleene PARAS, MD  pantoprazole  (PROTONIX ) 40 MG tablet TAKE 1 TABLET BY MOUTH DAILY AT 6 A.M. 09/20/23   Nahser, Aleene PARAS, MD  PARoxetine  (PAXIL ) 40 MG tablet Take 40 mg by mouth at bedtime.     [provider]    Allergies: Sulfa antibiotics    Review of Systems  All other systems reviewed and are negative.   Updated Vital  Signs BP (!) 150/68   Pulse 97   Temp 98.4 F (36.9 C)   Resp 14   Ht 5' 7 (1.702 m)   Wt 77.1 kg   SpO2 97%   BMI 26.63 kg/m   Physical Exam Vitals and nursing note reviewed.  Constitutional:      General: She is not in acute distress.    Appearance: Normal appearance.  HENT:     Head: Normocephalic and atraumatic.  Eyes:     General:        Right eye: No discharge.        Left eye: No discharge.  Cardiovascular:     Rate and Rhythm: Normal rate and regular rhythm.     Heart sounds: No murmur heard.    No friction rub. No gallop.  Pulmonary:     Effort: Pulmonary effort is normal.     Breath sounds: Normal breath sounds.  Abdominal:     General: Bowel sounds are normal.     Palpations: Abdomen is soft.     Comments: Mild tenderness to palpation in the suprapubic region, no rebound, rigidity, guarding  Skin:    General: Skin is warm and dry.     Capillary Refill: Capillary refill takes less than 2 seconds.  Neurological:     Mental Status: She is alert and oriented to person, place, and time.  Psychiatric:        Mood and Affect: Mood normal.        Behavior: Behavior normal.     (all labs ordered are listed, but only abnormal results are displayed) Labs Reviewed  CBC WITH DIFFERENTIAL/PLATELET  BASIC METABOLIC PANEL WITH GFR  URINALYSIS, ROUTINE W REFLEX MICROSCOPIC    EKG: None  Radiology: No results found.   Procedures   Medications Ordered in the ED - No data to display  Clinical Course as of 06/29/24 1253  Sun Jun 29, 2024  1236 Rounded area of increased attenuation within bladder lumen, which could represent blood clot or tumor. Consider cystoscopy further evaluation.  No evidence of urolithiasis or hydronephrosis.  Colonic diverticulosis, without radiographic evidence of diverticulitis.  Large gastric diverticulum again noted.   [CP]    Clinical Course User Index [CP] Rosan Sherlean DEL, PA-C                                  Medical Decision Making Risk OTC drugs.   This patient is a 89 y.o. female  who presents to the ED for concern of hematuria.   Differential diagnoses prior to evaluation: The emergent differential diagnosis includes, but is not limited to,  UTI, kidney stone, bladder mass . This is not an exhaustive differential.   Past Medical History / Co-morbidities / Social History:  diabetes, CHF, CAD,  peripheral vascular disease status post right AKA   Physical Exam: Physical exam performed. The pertinent findings include: Vital signs overall stable other than some hypertension, blood pressure 160/78.  Lab Tests/Imaging studies: I personally interpreted labs/imaging and the pertinent results include: CBC overall unremarkable other than mild anemia, hemoglobin 11.7, BMP overall unremarkable.  UA with large hemoglobin, protein, nitrate positive, moderate leukocytes, rare bacteria and 6-10 white blood cells.  Given bacteria, nitrates, leukocytes and dysuria consistent with acute urinary tract infection, will culture and treat, CT abdomen pelvis with contrast shows bladder mass concerning for blood clot versus tumor. I agree with the radiologist interpretation.  Medications: I ordered medication including Tylenol  for pain, Rocephin  for acute infection, will discharge with cefadroxil  and send urine for culture.  I have reviewed the patients home medicines and have made adjustments as needed.   Consults: I spoke with urologist, Dr. Francisca who recommends outpatient evaluation, agrees no indication for emergent cystoscopy at this time.  Suspects based on CT evaluation bladder mass does likely represent a tumor  Disposition: After consideration of the diagnostic results and the patients response to treatment, I feel that patient stable for discharge with plan as above.   emergency department workup does not suggest an emergent condition requiring admission or immediate intervention beyond what has been  performed at this time. The plan is: as above. The patient is safe for discharge and has been instructed to return immediately for worsening symptoms, change in symptoms or any other concerns.   Final diagnoses:  None    ED Discharge Orders     None          Rosan Sherlean DEL, PA-C 06/29/24 1451    Franklyn Sid SAILOR, MD 07/01/24 1106

## 2024-07-01 LAB — URINE CULTURE: Culture: 40000 — AB

## 2024-07-02 ENCOUNTER — Telehealth (HOSPITAL_BASED_OUTPATIENT_CLINIC_OR_DEPARTMENT_OTHER): Payer: Self-pay | Admitting: *Deleted

## 2024-07-02 NOTE — Telephone Encounter (Signed)
 Post ED Visit - Positive Culture Follow-up  Culture report reviewed by antimicrobial stewardship pharmacist: [facility] [x]  Rankin Sams, Pharm.D. []  Venetia Gully, Pharm.D., BCPS AQ-ID []  Garrel Crews, Pharm.D., BCPS []  Almarie Lunger, Pharm.D., BCPS []  [place], [place].D., BCPS, AAHIVP []  Rosaline Bihari, Pharm.D., BCPS, AAHIVP []  Vernell Meier, PharmD, BCPS []  Latanya Hint, PharmD, BCPS []  Donald Medley, PharmD, BCPS []  Rocky Bold, PharmD []  Dorothyann Alert, PharmD, BCPS []  Morene Babe, PharmD  [facility] []  Rosaline Edison, PharmD []  Romona Bliss, PharmD []  Dolphus Roller, PharmD []  Veva Seip, Rph []  Vernell Daunt) Leonce, PharmD []  Eva Allis, PharmD []  Rosaline Millet, PharmD []  Iantha Batch, PharmD []  Arvin Gauss, PharmD []  Wanda Hasting, PharmD []  Ronal Rav, PharmD []  Rocky Slade, PharmD []  Bard Jeans, PharmD   Positive urine culture Treated with cefadroxil , organism sensitive to the same and no further patient follow-up is required at this time.  Lorita Barnie Pereyra 07/02/2024, 12:46 PM

## 2024-07-21 ENCOUNTER — Emergency Department (HOSPITAL_COMMUNITY)

## 2024-07-21 ENCOUNTER — Other Ambulatory Visit: Payer: Self-pay

## 2024-07-21 ENCOUNTER — Encounter (HOSPITAL_COMMUNITY): Payer: Self-pay

## 2024-07-21 ENCOUNTER — Inpatient Hospital Stay (HOSPITAL_COMMUNITY)
Admission: EM | Admit: 2024-07-21 | Discharge: 2024-07-23 | DRG: 683 | Disposition: A | Discharge status: Home / Self Care | Attending: Family Medicine | Admitting: Family Medicine

## 2024-07-21 DIAGNOSIS — E1151 Type 2 diabetes mellitus with diabetic peripheral angiopathy without gangrene: Secondary | ICD-10-CM | POA: Diagnosis present

## 2024-07-21 DIAGNOSIS — K5732 Diverticulitis of large intestine without perforation or abscess without bleeding: Secondary | ICD-10-CM | POA: Diagnosis present

## 2024-07-21 DIAGNOSIS — Z955 Presence of coronary angioplasty implant and graft: Secondary | ICD-10-CM | POA: Diagnosis not present

## 2024-07-21 DIAGNOSIS — Z89511 Acquired absence of right leg below knee: Secondary | ICD-10-CM | POA: Diagnosis not present

## 2024-07-21 DIAGNOSIS — I5032 Chronic diastolic (congestive) heart failure: Secondary | ICD-10-CM | POA: Diagnosis present

## 2024-07-21 DIAGNOSIS — N179 Acute kidney failure, unspecified: Secondary | ICD-10-CM | POA: Diagnosis present

## 2024-07-21 DIAGNOSIS — E861 Hypovolemia: Secondary | ICD-10-CM | POA: Diagnosis present

## 2024-07-21 DIAGNOSIS — Z7902 Long term (current) use of antithrombotics/antiplatelets: Secondary | ICD-10-CM

## 2024-07-21 DIAGNOSIS — I2510 Atherosclerotic heart disease of native coronary artery without angina pectoris: Secondary | ICD-10-CM | POA: Diagnosis present

## 2024-07-21 DIAGNOSIS — E039 Hypothyroidism, unspecified: Secondary | ICD-10-CM | POA: Diagnosis present

## 2024-07-21 DIAGNOSIS — Z882 Allergy status to sulfonamides status: Secondary | ICD-10-CM

## 2024-07-21 DIAGNOSIS — Z8249 Family history of ischemic heart disease and other diseases of the circulatory system: Secondary | ICD-10-CM

## 2024-07-21 DIAGNOSIS — E049 Nontoxic goiter, unspecified: Secondary | ICD-10-CM | POA: Diagnosis present

## 2024-07-21 DIAGNOSIS — F32A Depression, unspecified: Secondary | ICD-10-CM | POA: Diagnosis present

## 2024-07-21 DIAGNOSIS — I110 Hypertensive heart disease with heart failure: Secondary | ICD-10-CM | POA: Diagnosis present

## 2024-07-21 DIAGNOSIS — Z87891 Personal history of nicotine dependence: Secondary | ICD-10-CM | POA: Diagnosis not present

## 2024-07-21 DIAGNOSIS — Z7982 Long term (current) use of aspirin: Secondary | ICD-10-CM

## 2024-07-21 DIAGNOSIS — K219 Gastro-esophageal reflux disease without esophagitis: Secondary | ICD-10-CM | POA: Diagnosis present

## 2024-07-21 DIAGNOSIS — K5792 Diverticulitis of intestine, part unspecified, without perforation or abscess without bleeding: Principal | ICD-10-CM

## 2024-07-21 DIAGNOSIS — Z794 Long term (current) use of insulin: Secondary | ICD-10-CM

## 2024-07-21 DIAGNOSIS — Z79899 Other long term (current) drug therapy: Secondary | ICD-10-CM

## 2024-07-21 DIAGNOSIS — Z833 Family history of diabetes mellitus: Secondary | ICD-10-CM | POA: Diagnosis not present

## 2024-07-21 DIAGNOSIS — I9589 Other hypotension: Secondary | ICD-10-CM | POA: Diagnosis present

## 2024-07-21 DIAGNOSIS — E1142 Type 2 diabetes mellitus with diabetic polyneuropathy: Secondary | ICD-10-CM | POA: Diagnosis present

## 2024-07-21 DIAGNOSIS — N329 Bladder disorder, unspecified: Secondary | ICD-10-CM | POA: Diagnosis present

## 2024-07-21 LAB — COMPREHENSIVE METABOLIC PANEL WITH GFR
ALT: 15 U/L (ref 0–44)
AST: 20 U/L (ref 15–41)
Albumin: 3.7 g/dL (ref 3.5–5.0)
Alkaline Phosphatase: 87 U/L (ref 38–126)
Anion gap: 11 (ref 5–15)
BUN: 25 mg/dL — ABNORMAL HIGH (ref 8–23)
CO2: 26 mmol/L (ref 22–32)
Calcium: 9.3 mg/dL (ref 8.9–10.3)
Chloride: 103 mmol/L (ref 98–111)
Creatinine, Ser: 1.06 mg/dL — ABNORMAL HIGH (ref 0.44–1.00)
GFR, Estimated: 50 mL/min — ABNORMAL LOW
Glucose, Bld: 201 mg/dL — ABNORMAL HIGH (ref 70–99)
Potassium: 4.2 mmol/L (ref 3.5–5.1)
Sodium: 141 mmol/L (ref 135–145)
Total Bilirubin: 0.3 mg/dL (ref 0.0–1.2)
Total Protein: 6.3 g/dL — ABNORMAL LOW (ref 6.5–8.1)

## 2024-07-21 LAB — I-STAT CHEM 8, ED
BUN: 25 mg/dL — ABNORMAL HIGH (ref 8–23)
Calcium, Ion: 1.22 mmol/L (ref 1.15–1.40)
Chloride: 103 mmol/L (ref 98–111)
Creatinine, Ser: 1.3 mg/dL — ABNORMAL HIGH (ref 0.44–1.00)
Glucose, Bld: 192 mg/dL — ABNORMAL HIGH (ref 70–99)
HCT: 33 % — ABNORMAL LOW (ref 36.0–46.0)
Hemoglobin: 11.2 g/dL — ABNORMAL LOW (ref 12.0–15.0)
Potassium: 4.1 mmol/L (ref 3.5–5.1)
Sodium: 142 mmol/L (ref 135–145)
TCO2: 26 mmol/L (ref 22–32)

## 2024-07-21 LAB — CBC WITH DIFFERENTIAL/PLATELET
Abs Immature Granulocytes: 0.05 K/uL (ref 0.00–0.07)
Basophils Absolute: 0.1 K/uL (ref 0.0–0.1)
Basophils Relative: 0 %
Eosinophils Absolute: 0.3 K/uL (ref 0.0–0.5)
Eosinophils Relative: 3 %
HCT: 34.7 % — ABNORMAL LOW (ref 36.0–46.0)
Hemoglobin: 11.1 g/dL — ABNORMAL LOW (ref 12.0–15.0)
Immature Granulocytes: 0 %
Lymphocytes Relative: 13 %
Lymphs Abs: 1.4 K/uL (ref 0.7–4.0)
MCH: 29.7 pg (ref 26.0–34.0)
MCHC: 32 g/dL (ref 30.0–36.0)
MCV: 92.8 fL (ref 80.0–100.0)
Monocytes Absolute: 0.9 K/uL (ref 0.1–1.0)
Monocytes Relative: 8 %
Neutro Abs: 8.5 K/uL — ABNORMAL HIGH (ref 1.7–7.7)
Neutrophils Relative %: 76 %
Platelets: 293 K/uL (ref 150–400)
RBC: 3.74 MIL/uL — ABNORMAL LOW (ref 3.87–5.11)
RDW: 13.9 % (ref 11.5–15.5)
WBC: 11.2 K/uL — ABNORMAL HIGH (ref 4.0–10.5)
nRBC: 0 % (ref 0.0–0.2)

## 2024-07-21 LAB — GLUCOSE, CAPILLARY: Glucose-Capillary: 178 mg/dL — ABNORMAL HIGH (ref 70–99)

## 2024-07-21 LAB — LIPASE, BLOOD: Lipase: 19 U/L (ref 11–51)

## 2024-07-21 MED ORDER — IOHEXOL 350 MG/ML SOLN
75.0000 mL | Freq: Once | INTRAVENOUS | Status: AC | PRN
  Administered 2024-07-21: 75 mL via INTRAVENOUS

## 2024-07-21 MED ORDER — LACTATED RINGERS IV BOLUS
500.0000 mL | Freq: Once | INTRAVENOUS | Status: AC
  Administered 2024-07-21: 500 mL via INTRAVENOUS

## 2024-07-21 MED ORDER — ATORVASTATIN CALCIUM 10 MG PO TABS
20.0000 mg | ORAL_TABLET | Freq: Every day | ORAL | Status: DC
  Administered 2024-07-22 – 2024-07-23 (×2): 20 mg via ORAL
  Filled 2024-07-21 (×2): qty 2

## 2024-07-21 MED ORDER — SODIUM CHLORIDE 0.9 % IV BOLUS
500.0000 mL | Freq: Once | INTRAVENOUS | Status: AC
  Administered 2024-07-21: 500 mL via INTRAVENOUS

## 2024-07-21 MED ORDER — SODIUM CHLORIDE 0.9 % IV SOLN
2.0000 g | Freq: Once | INTRAVENOUS | Status: AC
  Administered 2024-07-21: 2 g via INTRAVENOUS
  Filled 2024-07-21: qty 20

## 2024-07-21 MED ORDER — SODIUM CHLORIDE 0.9 % IV SOLN: INTRAVENOUS | Status: DC

## 2024-07-21 MED ORDER — METRONIDAZOLE 500 MG/100ML IV SOLN
500.0000 mg | Freq: Once | INTRAVENOUS | Status: AC
  Administered 2024-07-21: 500 mg via INTRAVENOUS
  Filled 2024-07-21: qty 100

## 2024-07-21 MED ORDER — ASPIRIN 81 MG PO TBEC
81.0000 mg | DELAYED_RELEASE_TABLET | Freq: Every day | ORAL | Status: DC
  Administered 2024-07-21 – 2024-07-23 (×3): 81 mg via ORAL
  Filled 2024-07-21 (×3): qty 1

## 2024-07-21 NOTE — ED Provider Notes (Signed)
 " [place] [facility]ERGENCY DEPARTMENT AT [facility] Provider Note   CSN: [id number] Arrival date & time: 07/21/24  1119     Patient presents with: Hypotension, Diarrhea, and Fatigue   [name] is a 89 y.o. female.   HPI 89 year old female presents with abdominal pain, diarrhea, and lightheadedness.  Diarrhea has been on and off but the abdominal pain started this morning and she felt lightheaded like she was going to pass out.  Thought it might be a blood sugar problem but her blood sugar was 90.  She denies any fevers or vomiting.  She has chronic dyspnea, especially when laying flat, but that is unchanged compared to baseline.  No cough, chest pain, vomiting.  No leg swelling.  EMS notes some soft blood pressures in the 90s.  Prior to Admission medications  Medication Sig Start Date End Date Taking? Authorizing Provider  aspirin  EC 325 MG tablet Take 325 mg by mouth at bedtime.    [provider]  atorvastatin  (LIPITOR) 20 MG tablet Take 1 tablet (20 mg total) by mouth daily. 11/21/22 11/21/23  Serene Gaile ORN, MD  BD PEN NEEDLE NANO 2ND GEN 32G X 4 MM MISC SMARTSIG:Injection Daily 12/08/20   [provider]  bismuth  subsalicylate (PEPTO BISMOL) 262 MG/15ML suspension Take 30 mLs by mouth every 6 (six) hours as needed for diarrhea or loose stools.    [provider]  carvedilol  (COREG ) 25 MG tablet Take 1 tablet (25 mg total) by mouth 2 (two) times daily with a meal. *Please call and schedule an appointment with Dr Darron* Patient taking differently: Take 25 mg by mouth at bedtime. 07/18/16   Darron Deatrice LABOR, MD  cefadroxil  (DURICEF) 500 MG capsule Take 1 capsule (500 mg total) by mouth 2 (two) times daily. 06/29/24   Prosperi, Christian H, PA-C  Cholecalciferol (VITAMIN D) 50 MCG (2000 UT) tablet Take 2,000 Units by mouth daily.    [provider]  clopidogrel  (PLAVIX ) 75 MG tablet TAKE 1 TABLET BY MOUTH AT BEDTIME 06/02/24   Sheree Penne Bruckner, MD  Coenzyme Q10 (COQ-10) 400 MG CAPS Take 400 mg by mouth daily.    [provider]  Continuous Blood Gluc Sensor (FREESTYLE LIBRE 2 SENSOR) MISC APPLY AS DIRECTED AND USE TO MONITOR BLOOD GLUCOSE CONTINUOUSLY. CHANGE EVERY 14 DAYS 01/30/21   [provider]  Continuous Blood Gluc Sensor (FREESTYLE LIBRE 2 SENSOR) MISC APPLY AS DIRECTED AND USE TO MONITOR BLOOD GLUCOSE CONTINUOUSLY. CHANGE EVERY 14 DAYS    [provider]  ergocalciferol (VITAMIN D2) 1.25 MG (50000 UT) capsule Take 50,000 Units by mouth every Friday.     [provider]  ezetimibe (ZETIA) 10 MG tablet Take 10 mg by mouth daily.    [provider]  FREESTYLE LITE test strip 2 (two) times daily as needed. 12/16/20   [provider]  furosemide  (LASIX ) 40 MG tablet TAKE 1 TABLET(40 MG) BY MOUTH DAILY Patient taking differently: Take 40 mg by mouth daily as needed for edema. 05/10/22   Dann Candyce RAMAN, MD  glucose blood (FREESTYLE LITE) test strip USE TO CHECK BLOOD GLUCOSE TWICE DAILY AND AS NEEDED    [provider]  Insulin  Glargine (BASAGLAR  KWIKPEN) 100 UNIT/ML Inject 60 Units into the skin at bedtime.    [provider]  insulin  lispro (HUMALOG) 100 UNIT/ML injection Inject 8-16 Units into the skin See admin instructions. Inject 8 units at breakfast, 16 units at lunch, and 16 units  at diner    [provider]  latanoprost  (XALATAN ) 0.005 % ophthalmic solution Place 1 drop into both eyes at bedtime. 09/05/22   [provider]  lisinopril  (PRINIVIL ,ZESTRIL ) 20 MG tablet Take 20 mg by mouth every evening.  10/29/15   [provider]  Multiple Minerals-Vitamins (CAL-MAG-ZINC-D PO) Take 1 tablet by mouth daily.    [provider]  nitroGLYCERIN  (NITROSTAT ) 0.4 MG SL tablet DISSOLVE 1 TAB UNDER TONGUE FOR CHEST PAIN - IF PAIN REMAINS AFTER 5 MIN, CALL 911 AND REPEAT DOSE. MAX 3 TABS IN 15 MINUTES 12/03/23   Nahser,  Aleene PARAS, MD  pantoprazole  (PROTONIX ) 40 MG tablet TAKE 1 TABLET BY MOUTH DAILY AT 6 A.M. 09/20/23   Nahser, Aleene PARAS, MD  PARoxetine  (PAXIL ) 40 MG tablet Take 40 mg by mouth at bedtime.     [provider]    Allergies: Sulfa antibiotics    Review of Systems  Constitutional:  Negative for fever.  Respiratory:  Negative for cough.   Cardiovascular:  Negative for chest pain.  Gastrointestinal:  Positive for abdominal pain and diarrhea. Negative for vomiting.  Neurological:  Positive for light-headedness.    Updated Vital Signs BP (!) 94/40 (BP Location: Right Arm)   Pulse (!) 46   Temp (!) 97.4 F (36.3 C) (Oral)   Resp 18   Ht 5' 8 (1.727 m)   Wt 79.4 kg   SpO2 100%   BMI 26.61 kg/m   Physical Exam Vitals and nursing note reviewed.  Constitutional:      Appearance: She is well-developed.  HENT:     Head: Normocephalic and atraumatic.  Cardiovascular:     Rate and Rhythm: Regular rhythm. Bradycardia present.     Heart sounds: Normal heart sounds.  Pulmonary:     Effort: Pulmonary effort is normal.     Breath sounds: Normal breath sounds. No wheezing, rhonchi or rales.  Abdominal:     Palpations: Abdomen is soft.     Tenderness: There is abdominal tenderness (mild) in the left lower quadrant.  Skin:    General: Skin is warm and dry.  Neurological:     Mental Status: She is alert.     (all labs ordered are listed, but only abnormal results are displayed) Labs Reviewed  COMPREHENSIVE METABOLIC PANEL WITH GFR - Abnormal; Notable for the following components:      Result Value   Glucose, Bld 201 (*)    BUN 25 (*)    Creatinine, Ser 1.06 (*)    Total Protein 6.3 (*)    GFR, Estimated 50 (*)    All other components within normal limits  CBC WITH DIFFERENTIAL/PLATELET - Abnormal; Notable for the following components:   WBC 11.2 (*)    RBC 3.74 (*)    Hemoglobin 11.1 (*)    HCT 34.7 (*)    Neutro Abs 8.5 (*)    All other components within normal limits   I-STAT CHEM 8, ED - Abnormal; Notable for the following components:   BUN 25 (*)    Creatinine, Ser 1.30 (*)    Glucose, Bld 192 (*)    Hemoglobin 11.2 (*)    HCT 33.0 (*)    All other components within normal limits  LIPASE, BLOOD  URINALYSIS, ROUTINE W REFLEX MICROSCOPIC    EKG: EKG Interpretation Date/Time:  Monday July 21 2024 12:09:58 EST Ventricular Rate:  41 PR Interval:  215 QRS Duration:  166 QT Interval:  633 QTC Calculation: 523 R  Axis:   -78  Text Interpretation: Sinus bradycardia Nonspecific IVCD with LAD LVH with secondary repolarization abnormality Confirmed by Freddi Hamilton [phone]) on 07/21/2024 12:11:59 PM  Radiology: CT ABDOMEN PELVIS W CONTRAST Result Date: 07/21/2024 EXAM: CT ABDOMEN AND PELVIS WITH CONTRAST 07/21/2024 12:31:06 PM TECHNIQUE: CT of the abdomen and pelvis was performed with the administration of 75 mL iohexol  (OMNIPAQUE ) 350 MG/ML injection. Multiplanar reformatted images are provided for review. Automated exposure control, iterative reconstruction, and/or weight-based adjustment of the mA/kV was utilized to reduce the radiation dose to as low as reasonably achievable. COMPARISON: 06/29/2024 CLINICAL HISTORY: LLQ abdominal pain. * Tracking Code: BO * FINDINGS: LOWER CHEST: New bibasilar compressive subsegmental atelectasis. Mild cardiomegaly. Multivessel coronary artery calcification. New small bilateral pleural effusions. LIVER: The liver is unremarkable. GALLBLADDER AND BILE DUCTS: Gallbladder is unremarkable. No biliary ductal dilatation. SPLEEN: Normal in size and morphology. PANCREAS: Normal, without duct dilatation or acute inflammation. ADRENAL GLANDS: Normal, without mass. KIDNEYS, URETERS AND BLADDER: Too small to characterized lower pole right renal lesion. Normal left kidney for age. No hydronephrosis. A left pelvic calcification on image 66/2 is favored to be vascular, medial to the left ureter. Left bladder base enhancing mass measures 3.6  x 3.2 cm on image 86/2. GI AND BOWEL: The proximal stomach is underdistended, but appears thick walled on image 28/2. Moderate gastric diverticulum is again identified posteriorly. Extensive colonic diverticulosis. Equivocal mild pericolonic edema adjacent to sigmoid including on image 82 / 2. Nonobstructive lipoma versus lipomatous hypertrophy about the ileocecal valve at 2.0 cm. Appendix not visualized. PERITONEUM AND RETROPERITONEUM: No significant free fluid. No free air. VASCULATURE: Advanced aortic and branch vessel atherosclerosis. LYMPH NODES: No lymphadenopathy. REPRODUCTIVE ORGANS: Hysterectomy. No adnexal mass. BONES AND SOFT TISSUES: Mild convex left lumbar spine curvature. Lumbosacral spondylosis. No acute osseous abnormality. No focal soft tissue abnormality. IMPRESSION: 1. Enhancing left-sided bladder mass, at the site of abnormality on 06/29/24 CT, most consistent with urothelial carcinoma. 2. Colon colonic diverticulosis with equivocal / mild sigmoid diverticulitis. 3. No evidence of metastatic disease. 4. New small bilateral pleural effusions and bibasilar atelectasis. 5. Proximal gastric wall thickening could represent gastritis or be artifactual in the setting of underdistention. Electronically signed by: Rockey Kilts MD 07/21/2024 01:30 PM EST RP Workstation: [id number]   DG Chest Portable 1 View Result Date: 07/21/2024 EXAM: 1 VIEW(S) XRAY OF THE CHEST 07/21/2024 12:05:11 PM COMPARISON: CT abdomen and pelvis dated 07/21/2024 and prior study dated 03/19/2020. CLINICAL HISTORY: weakness FINDINGS: LUNGS AND PLEURA: Small right and trace left pleural effusions. Hazy bibasilar airspace opacities. Elevated right hemidiaphragm. No pneumothorax. HEART AND MEDIASTINUM: Aortic atherosclerosis. Rounded masslike opacity along right paramediastinal border. Right mediastinal mass corresponds to goiter seen on comparison CT from 2021. BONES AND SOFT TISSUES: Multilevel thoracic osteophytosis. IMPRESSION: 1.  Hazy bibasilar airspace opacities. 2. Small right and trace left pleural effusions. 3. Elevated right hemidiaphragm. Electronically signed by: Norleen Boxer MD 07/21/2024 01:01 PM EST RP Workstation: [id number]     .Critical Care  Performed by: Freddi Hamilton, MD Authorized by: Freddi Hamilton, MD   Critical care provider statement:    Critical care time (minutes):  30   Critical care time was exclusive of:  Separately billable procedures and treating other patients   Critical care was necessary to treat or prevent imminent or life-threatening deterioration of the following conditions:  Circulatory failure   Critical care was time spent personally by me on the following activities:  Development of treatment plan with patient or surrogate, discussions  with consultants, evaluation of patient's response to treatment, examination of patient, ordering and review of laboratory studies, ordering and review of radiographic studies, ordering and performing treatments and interventions, pulse oximetry, re-evaluation of patient's condition and review of old charts    Medications Ordered in the ED  lactated ringers  bolus 500 mL (has no administration in time range)  cefTRIAXone  (ROCEPHIN ) 2 g in sodium chloride  0.9 % 100 mL IVPB (has no administration in time range)    And  metroNIDAZOLE  (FLAGYL ) IVPB 500 mg (has no administration in time range)  lactated ringers  bolus 500 mL (500 mLs Intravenous New Bag/Given 07/21/24 1207)  iohexol  (OMNIPAQUE ) 350 MG/ML injection 75 mL (75 mLs Intravenous Contrast Given 07/21/24 1227)                                    Medical Decision Making Amount and/or Complexity of Data Reviewed External Data Reviewed: notes. Labs: ordered.    Details: Mild AKI Radiology: ordered and independent interpretation performed.    Details: Possible mild diverticulitis ECG/medicine tests: ordered and independent interpretation performed.    Details: Sinus  bradycardia  Risk Prescription drug management. Decision regarding hospitalization.   Patient presents with hypotension and diarrhea.  She was given some gentle fluids given her known CHF history.  She has responded to some fluids and will keep some gentle fluids going as I suspect that most of her hypotension is from diarrhea as well as a combination of taking her blood pressure meds.  There is some possible diverticulitis which would make sense with the left-sided pain, will cover with antibiotics but I do not think her hypotension is a sepsis problem.  She will need admission, discussed with Dr. Georgina.     Final diagnoses:  Acute diverticulitis  Hypotension due to hypovolemia    ED Discharge Orders     None          Freddi Hamilton, MD 07/21/24 1500  "

## 2024-07-21 NOTE — ED Triage Notes (Signed)
 Pt brought in to ED by [facility] with complaints of Lower left abdominal pain with diarrhea for about a week. No reports of N&V. No chest pain but SHOB for about a week with laying down. Pt has a Hx of heart failure. Pt hypotensive with EMS.   EMS last vitals BP 102/39 P 42-44 O2 100% 2 liters CBG 273

## 2024-07-21 NOTE — H&P (Addendum)
 " History and Physical    Patient: [name] FMW:[id number] DOB: [birth year] DOA: 07/21/2024 DOS: the patient was seen and examined on 07/21/2024 PCP: Jeanette Elsie [name], MD  Patient coming from: Home  Chief Complaint:  Chief Complaint  Patient presents with   Hypotension   Diarrhea   Fatigue   HPI: [name] is a 89 y.o. female with medical history significant of CAD, PAD s/p R BKA iso OM, HFpEF, HTN, DM2, hypothyroidism, and known bladder mass p/w AKI iso hypotension.  The patient reported a feeling of impending syncope earlier in the day, accompanied by diarrhea occurring at approximately 3 AM. The patient experienced profuse sweating and lightheadedness while defecating multiple times (but noted that blood sugar levels remained normal despite being diabetic). The caregiver present at the time contacted the patient's son, who subsequently called paramedics. The patient noted that low blood pressure was a new occurrence. The patient reported a known tumor since December 22, which had not yet been biopsied due to delays over the Christmas holiday. The tumor was visualized during an earlier procedure and described as resembling a cauliflower. The patient reported no difficulty eating or drinking, although there was no desire to eat much due to low activity levels. The patient mentioned having been advised to increase protein intake.  In the ED, pt hypotensive (90/40s) and bradycardic. Labs notable for BUN/Cr 25/1.3. CT abd/pelvis showed enhancing left-sided bladder mass, at the site of abnormality on 06/29/24 CT, most consistent with urothelial carcinoma. CXR showed trace left and right pleural effusions. EDP started IV CTX/flagyl  x1 and requested medicine admission.   Review of Systems: As mentioned in the history of present illness. All other systems reviewed and are negative. Past Medical History:  Diagnosis Date   Anxiety    Cellulitis 10/2015   LEFT FOOT   CHF (congestive heart  failure) (HCC)    Complication of anesthesia    Coronary artery disease    Diabetes mellitus without complication (HCC)    insulin  dependent   GERD (gastroesophageal reflux disease)    Hypertension    Hypothyroidism    Neuromuscular disorder (HCC)    muscle cramps to lower extremities   Other primary cardiomyopathies    Peripheral vascular disease    PONV (postoperative nausea and vomiting)    Shortness of breath    Past Surgical History:  Procedure Laterality Date   ABDOMINAL AORTOGRAM N/A 09/03/2018   Procedure: ABDOMINAL AORTOGRAM;  Surgeon: Serene Gaile ORN, MD;  Location: [facility];  Service: Cardiovascular;  Laterality: N/A;   ABDOMINAL AORTOGRAM W/LOWER EXTREMITY N/A 04/16/2018   Procedure: ABDOMINAL AORTOGRAM W/LOWER EXTREMITY;  Surgeon: Serene Gaile ORN, MD;  Location: [facility];  Service: Cardiovascular;  Laterality: N/A;  unilateral   ABDOMINAL AORTOGRAM W/LOWER EXTREMITY Bilateral 04/08/2020   Procedure: ABDOMINAL AORTOGRAM W/LOWER EXTREMITY;  Surgeon: Serene Gaile ORN, MD;  Location: [facility];  Service: Cardiovascular;  Laterality: Bilateral;   ABDOMINAL AORTOGRAM W/LOWER EXTREMITY N/A 11/21/2022   Procedure: ABDOMINAL AORTOGRAM W/LOWER EXTREMITY;  Surgeon: Serene Gaile ORN, MD;  Location: [facility];  Service: Cardiovascular;  Laterality: N/A;   ABDOMINAL HYSTERECTOMY     AMPUTATION Left 12/03/2018   Procedure: Left 3rd ray amputation;  Surgeon: Kit Rush, MD;  Location: [facility];  Service: Orthopedics;  Laterality: Left;    AMPUTATION Right 06/22/2020   Procedure: AMPUTATION BELOW KNEE;  Surgeon: Kit Rush, MD;  Location: [facility];  Service: Orthopedics;  Laterality: Right;   CHOLECYSTECTOMY     CORONARY ANGIOPLASTY WITH STENT PLACEMENT  08/09/2011   DES  to mid circumflex   I & D EXTREMITY Left 10/29/2015   Procedure: Irrigation and Debridement Left Foot;  Surgeon: Jerona Harden GAILS, MD;  Location[facility];  Service:  Orthopedics;  Laterality: Left;   LEFT HEART CATHETERIZATION WITH CORONARY ANGIOGRAM N/A 08/08/2012   Procedure: LEFT HEART CATHETERIZATION WITH CORONARY ANGIOGRAM;  Surgeon: Candyce GORMAN Reek, MD;  Location: [facility];  Service: Cardiovascular;  Laterality: N/A;   PERIPHERAL VASCULAR ATHERECTOMY  04/16/2018   Procedure: PERIPHERAL VASCULAR ATHERECTOMY;  Surgeon: Serene Gaile ORN, MD;  [facility];  Service: Cardiovascular;;  lt. Peroneal   PERIPHERAL VASCULAR ATHERECTOMY Right 04/08/2020   Procedure: PERIPHERAL VASCULAR ATHERECTOMY;  Surgeon: Serene Gaile ORN, MD;  [facility];  Service: Cardiovascular;  Laterality: Right;  SFA and Peroneal   PERIPHERAL VASCULAR BALLOON ANGIOPLASTY  04/16/2018   Procedure: PERIPHERAL VASCULAR BALLOON ANGIOPLASTY;  Surgeon: Serene Gaile ORN, MD;  [facility];  Service: Cardiovascular;;  lt. sfa and AT   PERIPHERAL VASCULAR BALLOON ANGIOPLASTY Left 09/03/2018   Procedure: PERIPHERAL VASCULAR BALLOON ANGIOPLASTY;  Surgeon: Serene Gaile ORN, MD;  [facility];  Service: Cardiovascular;  Laterality: Left;  TP TRUNK   PERIPHERAL VASCULAR BALLOON ANGIOPLASTY Right 04/08/2020   Procedure: PERIPHERAL VASCULAR BALLOON ANGIOPLASTY;  Surgeon: Serene Gaile ORN, MD;  [facility];  Service: Cardiovascular;  Laterality: Right;  SFA (DCB), Peroneal   PERIPHERAL VASCULAR CATHETERIZATION N/A 12/30/2014   Procedure: Lower Extremity Angiography;  Surgeon: Deatrice DELENA Cage, MD;  [facility]: [facility];  Service: Cardiovascular;  Laterality: N/A;   PERIPHERAL VASCULAR INTERVENTION Left 09/03/2018   Procedure: PERIPHERAL VASCULAR INTERVENTION;  Surgeon: Serene Gaile ORN, MD;  [facility];  Service: Cardiovascular;  Laterality: Left;  SFA STENT    PERIPHERAL VASCULAR INTERVENTION  11/21/2022   Procedure: PERIPHERAL VASCULAR INTERVENTION;  Surgeon: Serene Gaile ORN, MD;  [facility];  Service: Cardiovascular;;   TENDON RELEASE Right 03/11/2020   Procedure: Heel Cord Lengthening;  Surgeon: Kit Rush, MD;  Location: [facility];  Service: Orthopedics;  Laterality: Right;   THYROID  SURGERY     radioactive iodine     TONSILLECTOMY     TRANSMETATARSAL AMPUTATION Right 03/11/2020   Procedure: Right foot transmetatarsal amputation;  Surgeon: Kit Rush, MD;  Location: [facility];  Service: Orthopedics;  Laterality: Right;   Social History:  reports that she quit smoking about 63 years ago. Her smoking use included cigarettes. She has never used smokeless tobacco. She reports that she does not currently use alcohol . She reports that she does not use drugs.  Allergies[1]  Family History  Problem Relation Age of Onset   Heart disease Father    Heart attack Father    Diabetes Sister    Heart disease Son        before age 73   Heart attack Grandchild        67yr old   Sudden death Grandchild    Hypertension Neg Hx     Prior to Admission medications  Medication Sig Start Date End Date Taking? Authorizing Provider  aspirin  EC 325 MG tablet Take 325 mg by mouth at bedtime.    [provider]  atorvastatin  (LIPITOR) 20 MG tablet Take 1 tablet (20 mg total) by mouth daily. 11/21/22 11/21/23  Serene Gaile ORN, MD  BD PEN NEEDLE NANO 2ND GEN 32G X 4 MM MISC SMARTSIG:Injection Daily 12/08/20   [provider]  bismuth  subsalicylate (PEPTO BISMOL) 262 MG/15ML suspension Take 30 mLs by mouth every 6 (six) hours as needed for diarrhea or loose stools.    [provider]  carvedilol  (COREG ) 25 MG tablet Take 1 tablet (25 mg total) by mouth 2 (two) times daily with a meal. *Please call and schedule an appointment with Dr Darron* Patient taking differently: Take 25 mg by mouth at bedtime. 07/18/16   Darron Deatrice LABOR, MD  cefadroxil  (DURICEF) 500 MG capsule Take 1 capsule (500 mg total) by mouth 2 (two) times daily.  06/29/24   Prosperi, Christian H, PA-C  Cholecalciferol (VITAMIN D) 50 MCG (2000 UT) tablet Take 2,000 Units by mouth daily.    [provider]  clopidogrel  (PLAVIX ) 75 MG tablet TAKE 1 TABLET BY MOUTH AT BEDTIME 06/02/24   Sheree Penne Bruckner, MD  Coenzyme Q10 (COQ-10) 400 MG CAPS Take 400 mg by mouth daily.    [provider]  Continuous Blood Gluc Sensor (FREESTYLE LIBRE 2 SENSOR) MISC APPLY AS DIRECTED AND USE TO MONITOR BLOOD GLUCOSE CONTINUOUSLY. CHANGE EVERY 14 DAYS 01/30/21   [provider]  Continuous Blood Gluc Sensor (FREESTYLE LIBRE 2 SENSOR) MISC APPLY AS DIRECTED AND USE TO MONITOR BLOOD GLUCOSE CONTINUOUSLY. CHANGE EVERY 14 DAYS    [provider]  ergocalciferol (VITAMIN D2) 1.25 MG (50000 UT) capsule Take 50,000 Units by mouth every Friday.     [provider]  ezetimibe (ZETIA) 10 MG tablet Take 10 mg by mouth daily.    [provider]  FREESTYLE LITE test strip 2 (two) times daily as needed. 12/16/20   [provider]  furosemide  (LASIX ) 40 MG tablet TAKE 1 TABLET(40 MG) BY MOUTH DAILY Patient taking differently: Take 40 mg by mouth daily as needed for edema. 05/10/22   Dann Candyce RAMAN, MD  glucose blood (FREESTYLE LITE) test strip USE TO CHECK BLOOD GLUCOSE TWICE DAILY AND AS NEEDED    [provider]  Insulin  Glargine (BASAGLAR  KWIKPEN) 100 UNIT/ML Inject 60 Units into the skin at bedtime.    [provider]  insulin  lispro (HUMALOG) 100 UNIT/ML injection Inject 8-16 Units into the skin See admin instructions. Inject 8 units at breakfast, 16 units at lunch, and 16 units at diner    [provider]  latanoprost  (XALATAN ) 0.005 % ophthalmic solution Place 1 drop into both eyes at bedtime. 09/05/22   [provider]  lisinopril  (PRINIVIL ,ZESTRIL ) 20 MG tablet Take 20 mg by mouth every evening.  10/29/15   [provider]  Multiple Minerals-Vitamins (CAL-MAG-ZINC-D PO)  Take 1 tablet by mouth daily.    [provider]  nitroGLYCERIN  (NITROSTAT ) 0.4 MG SL tablet DISSOLVE 1 TAB UNDER TONGUE FOR CHEST PAIN - IF PAIN REMAINS AFTER 5 MIN, CALL 911 AND REPEAT DOSE. MAX 3 TABS IN 15 MINUTES 12/03/23   Nahser, Aleene PARAS, MD  pantoprazole  (PROTONIX ) 40 MG tablet TAKE 1 TABLET BY MOUTH DAILY AT 6 A.M. 09/20/23   Nahser, Aleene PARAS, MD  PARoxetine  (PAXIL ) 40 MG tablet Take 40 mg by mouth at bedtime.     [provider]    Physical Exam: Vitals:   07/21/24 1134 07/21/24 1143 07/21/24 1144 07/21/24 1146  BP:   (!) 94/40   Pulse:   (!) 46   Resp:   18   Temp:   ROLLEN)  97.4 F (36.3 C)   TempSrc:   Oral   SpO2: 100% 100% 100%   Weight:    79.4 kg  Height:    5' 8 (1.727 m)   General: Alert, oriented x3, resting comfortably in no acute distress HEENT: EOMI, oropharynx clear, moist mucous membranes, hearing intact Neck: Trachea midline and no gross thyromegaly Respiratory: Lungs clear to auscultation bilaterally with normal respiratory effort; no w/r/r Cardiovascular: Regular rate and rhythm w/o m/r/g Abdomen: Soft, nontender, nondistended. Positive bowel sounds MSK: No obvious joint deformities or swelling Skin: No obvious rashes or lesions Neurologic: Awake, alert, spontaneously moves all extremities, strength intact Psychiatric: Appropriate mood and affect, conversational and cooperative   Data Reviewed:  Lab Results  Component Value Date   WBC 11.2 (H) 07/21/2024   HGB 11.2 (L) 07/21/2024   HCT 33.0 (L) 07/21/2024   MCV 92.8 07/21/2024   PLT 293 07/21/2024   Lab Results  Component Value Date   GLUCOSE 192 (H) 07/21/2024   CALCIUM  9.3 07/21/2024   NA 142 07/21/2024   K 4.1 07/21/2024   CO2 26 07/21/2024   CL 103 07/21/2024   BUN 25 (H) 07/21/2024   CREATININE 1.30 (H) 07/21/2024   Lab Results  Component Value Date   ALT 15 07/21/2024   AST 20 07/21/2024   ALKPHOS 87 07/21/2024   BILITOT 0.3 07/21/2024   Lab Results  Component  Value Date   INR 1.3 (H) 03/13/2020   INR 1.0 12/28/2014   Radiology: CT ABDOMEN PELVIS W CONTRAST Result Date: 07/21/2024 EXAM: CT ABDOMEN AND PELVIS WITH CONTRAST 07/21/2024 12:31:06 PM TECHNIQUE: CT of the abdomen and pelvis was performed with the administration of 75 mL iohexol  (OMNIPAQUE ) 350 MG/ML injection. Multiplanar reformatted images are provided for review. Automated exposure control, iterative reconstruction, and/or weight-based adjustment of the mA/kV was utilized to reduce the radiation dose to as low as reasonably achievable. COMPARISON: 06/29/2024 CLINICAL HISTORY: LLQ abdominal pain. * Tracking Code: BO * FINDINGS: LOWER CHEST: New bibasilar compressive subsegmental atelectasis. Mild cardiomegaly. Multivessel coronary artery calcification. New small bilateral pleural effusions. LIVER: The liver is unremarkable. GALLBLADDER AND BILE DUCTS: Gallbladder is unremarkable. No biliary ductal dilatation. SPLEEN: Normal in size and morphology. PANCREAS: Normal, without duct dilatation or acute inflammation. ADRENAL GLANDS: Normal, without mass. KIDNEYS, URETERS AND BLADDER: Too small to characterized lower pole right renal lesion. Normal left kidney for age. No hydronephrosis. A left pelvic calcification on image 66/2 is favored to be vascular, medial to the left ureter. Left bladder base enhancing mass measures 3.6 x 3.2 cm on image 86/2. GI AND BOWEL: The proximal stomach is underdistended, but appears thick walled on image 28/2. Moderate gastric diverticulum is again identified posteriorly. Extensive colonic diverticulosis. Equivocal mild pericolonic edema adjacent to sigmoid including on image 82 / 2. Nonobstructive lipoma versus lipomatous hypertrophy about the ileocecal valve at 2.0 cm. Appendix not visualized. PERITONEUM AND RETROPERITONEUM: No significant free fluid. No free air. VASCULATURE: Advanced aortic and branch vessel atherosclerosis. LYMPH NODES: No lymphadenopathy. REPRODUCTIVE  ORGANS: Hysterectomy. No adnexal mass. BONES AND SOFT TISSUES: Mild convex left lumbar spine curvature. Lumbosacral spondylosis. No acute osseous abnormality. No focal soft tissue abnormality. IMPRESSION: 1. Enhancing left-sided bladder mass, at the site of abnormality on 06/29/24 CT, most consistent with urothelial carcinoma. 2. Colon colonic diverticulosis with equivocal / mild sigmoid diverticulitis. 3. No evidence of metastatic disease. 4. New small bilateral pleural effusions and bibasilar atelectasis. 5. Proximal gastric wall thickening could represent gastritis or be artifactual  in the setting of underdistention. Electronically signed by: Rockey Kilts MD 07/21/2024 01:30 PM EST RP Workstation: [id number]   DG Chest Portable 1 View Result Date: 07/21/2024 EXAM: 1 VIEW(S) XRAY OF THE CHEST 07/21/2024 12:05:11 PM COMPARISON: CT abdomen and pelvis dated 07/21/2024 and prior study dated 03/19/2020. CLINICAL HISTORY: weakness FINDINGS: LUNGS AND PLEURA: Small right and trace left pleural effusions. Hazy bibasilar airspace opacities. Elevated right hemidiaphragm. No pneumothorax. HEART AND MEDIASTINUM: Aortic atherosclerosis. Rounded masslike opacity along right paramediastinal border. Right mediastinal mass corresponds to goiter seen on comparison CT from 2021. BONES AND SOFT TISSUES: Multilevel thoracic osteophytosis. IMPRESSION: 1. Hazy bibasilar airspace opacities. 2. Small right and trace left pleural effusions. 3. Elevated right hemidiaphragm. Electronically signed by: Norleen Boxer MD 07/21/2024 01:01 PM EST RP Workstation: [id number]    Assessment and Plan: 85F h/o CAD, HFpEF, HTN, DM2, hypothyroidism, and known bladder mass p/w AKI iso hypotension.  AKI -MIVF: NS at 50cc/h for 24h -Strict I&Os and daily weights (standing preferred) -F/u PVR to r/o post-renal obstruction -F/u BMP daily -Renally dose medications for CrCl -Avoid lovenox , NSAIDs, morphine , Fleet's phosphate enema, regular insulin ,  contrast; no gadolinium for MRI to avoid nephrogenic systemic fibrosis -Consider renal US  and nephrology consult if worsening AKI  Hypotension Presumed pre-renal  Diarrhea -MIVF per above -HOLD pta Coreg  25mg  BID for now; consider dose reduction prn  Bladder mass -Urology consulted; apprec eval/recs -Oncology consulted; apprec eval/recs   Advance Care Planning:   Code Status: Prior   Consults: Urology  Family Communication: Son  Severity of Illness: The appropriate patient status for this patient is INPATIENT. Inpatient status is judged to be reasonable and necessary in order to provide the required intensity of service to ensure the patient's safety. The patient's presenting symptoms, physical exam findings, and initial radiographic and laboratory data in the context of their chronic comorbidities is felt to place them at high risk for further clinical deterioration. Furthermore, it is not anticipated that the patient will be medically stable for discharge from the hospital within 2 midnights of admission.   * I certify that at the point of admission it is my clinical judgment that the patient will require inpatient hospital care spanning beyond 2 midnights from the point of admission due to high intensity of service, high risk for further deterioration and high frequency of surveillance required.*   ------- I spent 57 minutes reviewing previous notes, at the bedside counseling/discussing the treatment plan, and performing clinical documentation.  Author: Marsha Ada, MD 07/21/2024 2:26 PM  For on call review [email or web address].      [1]  Allergies Allergen Reactions   Sulfa Antibiotics Nausea And Vomiting    Severe vomiting.    "

## 2024-07-21 NOTE — ED Notes (Signed)
 Pt states she is unable to void at this time for the urine specimen and prefers not to be catheterized.

## 2024-07-22 ENCOUNTER — Encounter (HOSPITAL_COMMUNITY): Payer: Self-pay | Admitting: Hospitalist

## 2024-07-22 DIAGNOSIS — N179 Acute kidney failure, unspecified: Secondary | ICD-10-CM

## 2024-07-22 LAB — BASIC METABOLIC PANEL WITH GFR
Anion gap: 9 (ref 5–15)
BUN: 22 mg/dL (ref 8–23)
CO2: 26 mmol/L (ref 22–32)
Calcium: 8.6 mg/dL — ABNORMAL LOW (ref 8.9–10.3)
Chloride: 107 mmol/L (ref 98–111)
Creatinine, Ser: 0.85 mg/dL (ref 0.44–1.00)
GFR, Estimated: >60 mL/min
Glucose, Bld: 141 mg/dL — ABNORMAL HIGH (ref 70–99)
Potassium: 3.8 mmol/L (ref 3.5–5.1)
Sodium: 143 mmol/L (ref 135–145)

## 2024-07-22 LAB — CBC
HCT: 31.4 % — ABNORMAL LOW (ref 36.0–46.0)
Hemoglobin: 9.9 g/dL — ABNORMAL LOW (ref 12.0–15.0)
MCH: 29.2 pg (ref 26.0–34.0)
MCHC: 31.5 g/dL (ref 30.0–36.0)
MCV: 92.6 fL (ref 80.0–100.0)
Platelets: 268 K/uL (ref 150–400)
RBC: 3.39 MIL/uL — ABNORMAL LOW (ref 3.87–5.11)
RDW: 14.2 % (ref 11.5–15.5)
WBC: 9.9 K/uL (ref 4.0–10.5)
nRBC: 0 % (ref 0.0–0.2)

## 2024-07-22 LAB — GLUCOSE, CAPILLARY
Glucose-Capillary: 234 mg/dL — ABNORMAL HIGH (ref 70–99)
Glucose-Capillary: 150 mg/dL — ABNORMAL HIGH (ref 70–99)

## 2024-07-22 MED ORDER — PANTOPRAZOLE SODIUM 40 MG PO TBEC
40.0000 mg | DELAYED_RELEASE_TABLET | Freq: Every day | ORAL | Status: DC
  Administered 2024-07-22 – 2024-07-23 (×2): 40 mg via ORAL
  Filled 2024-07-22 (×2): qty 1

## 2024-07-22 MED ORDER — BASAGLAR KWIKPEN 100 UNIT/ML ~~LOC~~ SOPN: 20.0000 [IU] | PEN_INJECTOR | Freq: Every day | SUBCUTANEOUS | Status: DC

## 2024-07-22 MED ORDER — INSULIN ASPART 100 UNIT/ML IJ SOLN
5.0000 [IU] | Freq: Three times a day (TID) | INTRAMUSCULAR | Status: DC
  Administered 2024-07-22 – 2024-07-23 (×2): 5 [IU] via SUBCUTANEOUS
  Filled 2024-07-22 (×2): qty 5

## 2024-07-22 MED ORDER — PAROXETINE HCL 20 MG PO TABS
40.0000 mg | ORAL_TABLET | Freq: Every day | ORAL | Status: DC
  Administered 2024-07-22: 40 mg via ORAL
  Filled 2024-07-22: qty 2

## 2024-07-22 MED ORDER — INSULIN GLARGINE-YFGN 100 UNIT/ML ~~LOC~~ SOLN
20.0000 [IU] | Freq: Every day | SUBCUTANEOUS | Status: DC
  Filled 2024-07-22: qty 0.2

## 2024-07-22 MED ORDER — INSULIN GLARGINE 100 UNIT/ML ~~LOC~~ SOLN
20.0000 [IU] | Freq: Every day | SUBCUTANEOUS | Status: DC
  Administered 2024-07-22: 20 [IU] via SUBCUTANEOUS
  Filled 2024-07-22: qty 0.2

## 2024-07-22 MED ORDER — CLOPIDOGREL BISULFATE 75 MG PO TABS
75.0000 mg | ORAL_TABLET | Freq: Every day | ORAL | Status: DC
  Administered 2024-07-22: 75 mg via ORAL
  Filled 2024-07-22: qty 1

## 2024-07-22 MED ORDER — DORZOLAMIDE HCL-TIMOLOL MAL 2-0.5 % OP SOLN
1.0000 [drp] | Freq: Two times a day (BID) | OPHTHALMIC | Status: DC
  Administered 2024-07-22 – 2024-07-23 (×2): 1 [drp] via OPHTHALMIC
  Filled 2024-07-22: qty 10

## 2024-07-22 MED ORDER — LATANOPROST 0.005 % OP SOLN
1.0000 [drp] | Freq: Every day | OPHTHALMIC | Status: DC
  Administered 2024-07-22: 1 [drp] via OPHTHALMIC
  Filled 2024-07-22: qty 2.5

## 2024-07-22 NOTE — Progress Notes (Addendum)
 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  Pleural effusion elevated right hemidiaphragm CT abdomen pelvis enhancing left-sided bladder mass at the site of abnormality on 12/14?  Urothelial CA new bilateral pleural effusions bibasilar atelectasis proximal gastric wall thickening?  Gastritis    Assessment  & Plan :    AKI secondary to diverticular diarrhea with hypotension-not shock Seems to be resolved with some IV fluid--- stop saline 50 cc/H Holding for the time being Coreg  25 twice daily and resume in the next several days lower dose of Coreg --holding Lasix  40 daily which is a as needed daily med for swelling--- holding lisinopril  20  CAD DES in the past Holding holding for now coenzyme Q 10, Zetia 10  Can resume Plavix  75 daily-should be only on 81 of aspirin  so that has been resumed  Chr HfpEF On admit give fluid IV for AKI---hold for now Resume low dose lisniopril and coreg  when BP better Would hold lasix  for now--monitor weight day-day and reassess plans---strict I/o and is +896 cc  Known bladder mass Seen by Dr. Cam late December Please CC him at discharge or Mr. Sappenfield for close outpatient follow-up--no acute  inpatient need for their review  Underlying goiter characterized last in 2021 May require outpatient follow-up of this  DM TY 2 Home meds glargine 60 lispro 8 units a.m. 16 lunch 16 dinner Previous diabetic polyneuropathy with amputations CBG ranging 140-170 Cut ack long acting to 20 U, change mealtime to 5 U tid meals  Data Reviewed today: bun creat improved, WBC better   DVT prophylaxis: scd  Dispo/Global plan: scd   Subjective:  Awake coherent in nad--says has been SOB for several months--using more pillows.  Overall she feels fair now.no diarr n/v at this time No fever no chills  Objective + exam Vitals:   07/21/24 2023 07/22/24 0424 07/22/24 0425 07/22/24 0754  BP: (!) 119/30 (!) 126/48 (!) 126/48 124/80  Pulse: (!) 49 63 61 64  Resp: 18 18 18 18   Temp: 97.7 F (36.5 C) (!) 97.5 F (36.4 C) (!) 97.5 F (36.4 C) 97.8 F (36.6 C)  TempSrc: Oral   Oral  SpO2: 97% 97% 97% 98%  Weight:      Height:       Filed Weights   07/21/24 1146  Weight: 79.4 kg     Examination: awake coherne tin nad no focal deficit--looks younger than age, no ict no pallor Cta b no wheeze nor rales Abd sfot nt nd no rebound no guard R BKA noted, no edema  Scheduled Meds:  aspirin  EC  81 mg Oral Daily   atorvastatin   20 mg Oral Daily   clopidogrel   75 mg Oral  QHS   dorzolamide -timolol   1 drop Both Eyes BID   insulin  aspart  5 Units Subcutaneous TID WC   insulin  glargine  20 Units Subcutaneous QHS   latanoprost   1 drop Both Eyes QHS   pantoprazole   40 mg Oral Daily   PARoxetine   40 mg Oral QHS   Continuous Infusions:  sodium chloride  50 mL/hr at 07/22/24 0544     Time  33  Colen Grimes, MD  [facility]

## 2024-07-22 NOTE — Plan of Care (Signed)

## 2024-07-22 NOTE — Plan of Care (Signed)
 Urology contacted regarding known bladder mass. Pt stated to have not had follow up. She was seen by Dr. Cam on 12/22. Pending TURBT, to be scheduled once she can be cleared to pause blood thinners. Reviewed case and plan with her and her son at length.

## 2024-07-22 NOTE — Plan of Care (Signed)
" °  Problem: Education: Goal: Knowledge of General Education information will improve Description: Including pain rating scale, medication(s)/side effects and non-pharmacologic comfort measures 07/22/2024 0158 by Okey Caron SAUNDERS, RN Outcome: Progressing 07/22/2024 0150 by Okey Caron SAUNDERS, RN Outcome: Progressing   Problem: Health Behavior/Discharge Planning: Goal: Ability to manage health-related needs will improve 07/22/2024 0158 by Okey Caron SAUNDERS, RN Outcome: Progressing 07/22/2024 0150 by Okey Caron SAUNDERS, RN Outcome: Progressing   Problem: Clinical Measurements: Goal: Ability to maintain clinical measurements within normal limits will improve 07/22/2024 0158 by Okey Caron SAUNDERS, RN Outcome: Progressing 07/22/2024 0150 by Okey Caron SAUNDERS, RN Outcome: Progressing Goal: Will remain free from infection 07/22/2024 0158 by Okey Caron SAUNDERS, RN Outcome: Progressing 07/22/2024 0150 by Okey Caron SAUNDERS, RN Outcome: Progressing Goal: Diagnostic test results will improve 07/22/2024 0158 by Okey Caron SAUNDERS, RN Outcome: Progressing 07/22/2024 0150 by Okey Caron SAUNDERS, RN Outcome: Progressing Goal: Respiratory complications will improve 07/22/2024 0158 by Okey Caron SAUNDERS, RN Outcome: Progressing 07/22/2024 0150 by Okey Caron SAUNDERS, RN Outcome: Progressing Goal: Cardiovascular complication will be avoided 07/22/2024 0158 by Okey Caron SAUNDERS, RN Outcome: Progressing 07/22/2024 0150 by Okey Caron SAUNDERS, RN Outcome: Progressing   "

## 2024-07-23 ENCOUNTER — Telehealth: Payer: Self-pay | Admitting: Cardiovascular Disease

## 2024-07-23 ENCOUNTER — Other Ambulatory Visit: Payer: Self-pay | Admitting: Urology

## 2024-07-23 DIAGNOSIS — Z01818 Encounter for other preprocedural examination: Secondary | ICD-10-CM

## 2024-07-23 DIAGNOSIS — I25119 Atherosclerotic heart disease of native coronary artery with unspecified angina pectoris: Secondary | ICD-10-CM

## 2024-07-23 LAB — BASIC METABOLIC PANEL WITH GFR
Anion gap: 10 (ref 5–15)
BUN: 20 mg/dL (ref 8–23)
CO2: 25 mmol/L (ref 22–32)
Calcium: 9 mg/dL (ref 8.9–10.3)
Chloride: 107 mmol/L (ref 98–111)
Creatinine, Ser: 0.82 mg/dL (ref 0.44–1.00)
GFR, Estimated: >60 mL/min
Glucose, Bld: 168 mg/dL — ABNORMAL HIGH (ref 70–99)
Potassium: 3.9 mmol/L (ref 3.5–5.1)
Sodium: 142 mmol/L (ref 135–145)

## 2024-07-23 LAB — CBC WITH DIFFERENTIAL/PLATELET
Abs Immature Granulocytes: 0.05 K/uL (ref 0.00–0.07)
Basophils Absolute: 0 K/uL (ref 0.0–0.1)
Basophils Relative: 1 %
Eosinophils Absolute: 0.4 K/uL (ref 0.0–0.5)
Eosinophils Relative: 5 %
HCT: 32.5 % — ABNORMAL LOW (ref 36.0–46.0)
Hemoglobin: 10.5 g/dL — ABNORMAL LOW (ref 12.0–15.0)
Immature Granulocytes: 1 %
Lymphocytes Relative: 19 %
Lymphs Abs: 1.7 K/uL (ref 0.7–4.0)
MCH: 29.4 pg (ref 26.0–34.0)
MCHC: 32.3 g/dL (ref 30.0–36.0)
MCV: 91 fL (ref 80.0–100.0)
Monocytes Absolute: 0.8 K/uL (ref 0.1–1.0)
Monocytes Relative: 9 %
Neutro Abs: 5.7 K/uL (ref 1.7–7.7)
Neutrophils Relative %: 65 %
Platelets: 299 K/uL (ref 150–400)
RBC: 3.57 MIL/uL — ABNORMAL LOW (ref 3.87–5.11)
RDW: 14.2 % (ref 11.5–15.5)
WBC: 8.6 K/uL (ref 4.0–10.5)
nRBC: 0 % (ref 0.0–0.2)

## 2024-07-23 LAB — GLUCOSE, CAPILLARY: Glucose-Capillary: 157 mg/dL — ABNORMAL HIGH (ref 70–99)

## 2024-07-23 MED ORDER — ASPIRIN 81 MG PO TBEC: 81.0000 mg | DELAYED_RELEASE_TABLET | Freq: Every day | ORAL | 12 refills | Status: AC

## 2024-07-23 MED ORDER — FUROSEMIDE 40 MG PO TABS: 40.0000 mg | ORAL_TABLET | Freq: Every day | ORAL | 0 refills | Status: AC | PRN

## 2024-07-23 MED ORDER — CARVEDILOL 25 MG PO TABS: 12.5000 mg | ORAL_TABLET | Freq: Two times a day (BID) | ORAL | 0 refills | Status: AC

## 2024-07-23 MED ORDER — LISINOPRIL 20 MG PO TABS: 10.0000 mg | ORAL_TABLET | Freq: Every evening | ORAL | 0 refills | Status: AC

## 2024-07-23 MED ORDER — BASAGLAR KWIKPEN 100 UNIT/ML ~~LOC~~ SOPN: 30.0000 [IU] | PEN_INJECTOR | Freq: Every day | SUBCUTANEOUS | 0 refills | Status: AC

## 2024-07-23 NOTE — Telephone Encounter (Signed)
" ° °  Pre-operative Risk Assessment    Patient Name: [name]  DOB: 05/07/1932 MRN: [id number]      Request for Surgical Clearance    Procedure:  cystoscopy transurethral resection bladder  Date of Surgery:  Clearance 08/13/24                                 Surgeon:  Dr. Morene Salines Surgeon's Group or Practice Name:  [facility] Phone number:  [phone]  Fax number:  [phone]   Type of Clearance Requested:   - Medical  - Pharmacy:  Hold Aspirin  and Clopidogrel  (Plavix ) 5 days   Type of Anesthesia:  General    Additional requests/questions:    [name]   07/23/2024, 10:58 AM   "

## 2024-07-23 NOTE — Telephone Encounter (Signed)
 I s/w the pt and she agrees to appt in office 07/29/24 with Lum Louis, NP at our [facility] address for preop clearance.

## 2024-07-23 NOTE — Telephone Encounter (Signed)
 Primary card Dr. Alveta.

## 2024-07-23 NOTE — Discharge Summary (Signed)
 Physician Discharge Summary  Matthews Matthews FMW:[id number] DOB: Jan 24, 1932 DOA: 07/21/2024  PCP: Matthews Matthews Jeanette Mickey., MD  Admit date: 07/21/2024 Discharge date: 07/23/2024  Admitted From: Home Disposition: Home  Recommendations for Outpatient Follow-up:  Follow up with PCP in 1-2 weeks Follow-up with urology and oncology as scheduled  Home Health: None Equipment/Devices: None  Discharge Condition: Stable CODE STATUS: Full Diet recommendation: As tolerated diabetic diet  Brief/Interim Summary: 89 year old female known history of diabetic foot infection, status post previous transmetatarsal amputation 03/11/2020/right BKA 06/22/2020 growing MRSA.  Insulin -dependent diabetes type 2, hypertension, heart failure with preserved ejection fraction/CAD with prior stenting to the RCA, unspecified Gorder, depression, GERD.  Patient presents to our facility with worsening dyspnea at home and lower extremity edema.  She has prior hypotensive events at home feeling quite lightheaded like she may pass out if she got up too quickly or moves around too quickly.  Patient found to have AKI likely secondary to diarrheal illness and hypotension resolving with IV fluids.  Patient's home medications were weaned as below, to avoid rebound tachycardia or hypertension these medications were not discontinued and patient was educated at length at bedside this morning prior to discharge in regards of when to call family doctor/PCP to either increase or decrease doses as appropriate.  At this time given patient's rapid recovery and return to baseline and she is otherwise requesting discharge home which is certainly reasonable.  Patient's imaging revealed ongoing left-sided bladder mass, she has an appointment with urology in the next 1 to 2 weeks outpatient for further treatment and evaluation with TURBT.  Otherwise chronic comorbid conditions appear to be stable she is otherwise agreeable for discharge.  Discharge  Diagnoses:  Principal Problem:   AKI (acute kidney injury)   AKI secondary to diverticular diarrhea with hypotension-shock ruled out Blood pressure improving with increased p.o. intake No further requirement for IV fluids Resume home blood pressure medications at half dose   CAD DES in the past Reduce home antihypertensives, continue Plavix  and 81 mg aspirin , 325 mg aspirin  discontinued  Chr HfpEF Continue salt restricted flow restricted diet   Known/well-established bladder mass Seen by Dr. Cam late December Outpatient follow-up later this month as scheduled for TURBT   Underlying goiter characterized last in 2021 May require outpatient follow-up of this   DM TY 2 Home meds glargine 60 lispro 8 units a.m. 16 lunch 16 dinner Previous diabetic polyneuropathy with amputations Monitor point-of-care glucose checks closely over the next few days, follow-up with PCP if any abnormally high or low readings noted   Discharge Instructions  Discharge Instructions     Call MD for:  difficulty breathing, headache or visual disturbances   Complete by: As directed    Call MD for:  extreme fatigue   Complete by: As directed    Call MD for:  hives   Complete by: As directed    Call MD for:  persistant dizziness or light-headedness   Complete by: As directed    Call MD for:  persistant nausea and vomiting   Complete by: As directed    Call MD for:  severe uncontrolled pain   Complete by: As directed    Call MD for:  temperature >100.4   Complete by: As directed    Increase activity slowly   Complete by: As directed       Allergies as of 07/23/2024       Reactions   Sulfa Antibiotics Nausea And Vomiting   Severe vomiting.  Medication List     STOP taking these medications    CAL-MAG-ZINC-D PO   cefadroxil  500 MG capsule Commonly known as: DURICEF   CoQ-10 400 MG Caps   Vitamin D 50 MCG (2000 UT) tablet       TAKE these medications    aspirin  EC 81  MG tablet Take 1 tablet (81 mg total) by mouth daily. Swallow whole. Start taking on: July 24, 2024 What changed:  medication strength how much to take when to take this additional instructions   atorvastatin  20 MG tablet Commonly known as: Lipitor Take 1 tablet (20 mg total) by mouth daily.   Basaglar  KwikPen 100 UNIT/ML Inject 30 Units into the skin at bedtime. What changed: how much to take   bismuth  subsalicylate 262 MG/15ML suspension Commonly known as: PEPTO BISMOL Take 30 mLs by mouth every 6 (six) hours as needed for diarrhea or loose stools.   carvedilol  25 MG tablet Commonly known as: COREG  Take 0.5 tablets (12.5 mg total) by mouth 2 (two) times daily with a meal. *Please call and schedule an appointment with Dr Darron* What changed: how much to take   clopidogrel  75 MG tablet Commonly known as: PLAVIX  TAKE 1 TABLET BY MOUTH AT BEDTIME   dorzolamide -timolol  2-0.5 % ophthalmic solution Commonly known as: COSOPT  Place 1 drop into both eyes 2 (two) times daily.   ergocalciferol 1.25 MG (50000 UT) capsule Commonly known as: VITAMIN D2 Take 50,000 Units by mouth every Friday.   ezetimibe 10 MG tablet Commonly known as: ZETIA Take 10 mg by mouth daily.   furosemide  40 MG tablet Commonly known as: LASIX  Take 1 tablet (40 mg total) by mouth daily as needed for edema (swelling). What changed: See the new instructions.   insulin  lispro 100 UNIT/ML injection Commonly known as: HUMALOG Inject 8-16 Units into the skin See admin instructions. Inject 8 units at breakfast, 16 units at lunch, and 16 units at diner   latanoprost  0.005 % ophthalmic solution Commonly known as: XALATAN  Place 1 drop into both eyes at bedtime.   lisinopril  20 MG tablet Commonly known as: ZESTRIL  Take 0.5 tablets (10 mg total) by mouth every evening. What changed: how much to take   nitroGLYCERIN  0.4 MG SL tablet Commonly known as: NITROSTAT  DISSOLVE 1 TAB UNDER TONGUE FOR CHEST PAIN  - IF PAIN REMAINS AFTER 5 MIN, CALL 911 AND REPEAT DOSE. MAX 3 TABS IN 15 MINUTES   pantoprazole  40 MG tablet Commonly known as: PROTONIX  TAKE 1 TABLET BY MOUTH DAILY AT 6 A.M.   PARoxetine  40 MG tablet Commonly known as: PAXIL  Take 40 mg by mouth at bedtime.        Allergies[1]  Consultations: None  Procedures/Studies: CT ABDOMEN PELVIS W CONTRAST Result Date: 07/21/2024 EXAM: CT ABDOMEN AND PELVIS WITH CONTRAST 07/21/2024 12:31:06 PM TECHNIQUE: CT of the abdomen and pelvis was performed with the administration of 75 mL iohexol  (OMNIPAQUE ) 350 MG/ML injection. Multiplanar reformatted images are provided for review. Automated exposure control, iterative reconstruction, and/or weight-based adjustment of the mA/kV was utilized to reduce the radiation dose to as low as reasonably achievable. COMPARISON: 06/29/2024 CLINICAL HISTORY: LLQ abdominal pain. * Tracking Code: BO * FINDINGS: LOWER CHEST: New bibasilar compressive subsegmental atelectasis. Mild cardiomegaly. Multivessel coronary artery calcification. New small bilateral pleural effusions. LIVER: The liver is unremarkable. GALLBLADDER AND BILE DUCTS: Gallbladder is unremarkable. No biliary ductal dilatation. SPLEEN: Normal in size and morphology. PANCREAS: Normal, without duct dilatation or acute inflammation. ADRENAL GLANDS: Normal,  without mass. KIDNEYS, URETERS AND BLADDER: Too small to characterized lower pole right renal lesion. Normal left kidney for age. No hydronephrosis. A left pelvic calcification on image 66/2 is favored to be vascular, medial to the left ureter. Left bladder base enhancing mass measures 3.6 x 3.2 cm on image 86/2. GI AND BOWEL: The proximal stomach is underdistended, but appears thick walled on image 28/2. Moderate gastric diverticulum is again identified posteriorly. Extensive colonic diverticulosis. Equivocal mild pericolonic edema adjacent to sigmoid including on image 82 / 2. Nonobstructive lipoma versus  lipomatous hypertrophy about the ileocecal valve at 2.0 cm. Appendix not visualized. PERITONEUM AND RETROPERITONEUM: No significant free fluid. No free air. VASCULATURE: Advanced aortic and branch vessel atherosclerosis. LYMPH NODES: No lymphadenopathy. REPRODUCTIVE ORGANS: Hysterectomy. No adnexal mass. BONES AND SOFT TISSUES: Mild convex left lumbar spine curvature. Lumbosacral spondylosis. No acute osseous abnormality. No focal soft tissue abnormality. IMPRESSION: 1. Enhancing left-sided bladder mass, at the site of abnormality on 06/29/24 CT, most consistent with urothelial carcinoma. 2. Colon colonic diverticulosis with equivocal / mild sigmoid diverticulitis. 3. No evidence of metastatic disease. 4. New small bilateral pleural effusions and bibasilar atelectasis. 5. Proximal gastric wall thickening could represent gastritis or be artifactual in the setting of underdistention. Electronically signed by: Rockey Kilts MD 07/21/2024 01:30 PM EST RP Workstation: [id number]   DG Chest Portable 1 View Result Date: 07/21/2024 EXAM: 1 VIEW(S) XRAY OF THE CHEST 07/21/2024 12:05:11 PM COMPARISON: CT abdomen and pelvis dated 07/21/2024 and prior study dated 03/19/2020. CLINICAL HISTORY: weakness FINDINGS: LUNGS AND PLEURA: Small right and trace left pleural effusions. Hazy bibasilar airspace opacities. Elevated right hemidiaphragm. No pneumothorax. HEART AND MEDIASTINUM: Aortic atherosclerosis. Rounded masslike opacity along right paramediastinal border. Right mediastinal mass corresponds to goiter seen on comparison CT from 2021. BONES AND SOFT TISSUES: Multilevel thoracic osteophytosis. IMPRESSION: 1. Hazy bibasilar airspace opacities. 2. Small right and trace left pleural effusions. 3. Elevated right hemidiaphragm. Electronically signed by: Norleen Boxer MD 07/21/2024 01:01 PM EST RP Workstation: [id number]   CT ABDOMEN PELVIS WO CONTRAST Result Date: 06/29/2024 CLINICAL DATA:  Gross hematuria. EXAM: CT ABDOMEN AND  PELVIS WITHOUT CONTRAST TECHNIQUE: Multidetector CT imaging of the abdomen and pelvis was performed following the standard protocol without IV contrast. RADIATION DOSE REDUCTION: This exam was performed according to the departmental dose-optimization program which includes automated exposure control, adjustment of the mA and/or kV according to patient size and/or use of iterative reconstruction technique. COMPARISON:  08/02/2022 FINDINGS: Lower chest: No acute findings. Hepatobiliary: No mass visualized on this unenhanced exam. Prior cholecystectomy. No evidence of biliary obstruction. Pancreas: No mass or inflammatory process visualized on this unenhanced exam. Spleen:  Within normal limits in size. Adrenals/Urinary tract: No evidence of urolithiasis or hydronephrosis. Subtle rounded area of increased attenuation seen within bladder lumen, which could represent blood clot or tumor. Stomach/Bowel: Large diverticulum again seen arising from the posterior gastric fundus measuring 5.6 cm. No evidence of obstruction, inflammatory process, or abnormal fluid collections. Diffuse colonic diverticulosis again seen, without signs of diverticulitis. Vascular/Lymphatic: No pathologically enlarged lymph nodes identified. No evidence of abdominal aortic aneurysm. Reproductive: Prior hysterectomy noted. Adnexal regions are unremarkable in appearance. Other:  None. Musculoskeletal:  No suspicious bone lesions identified. IMPRESSION: Rounded area of increased attenuation within bladder lumen, which could represent blood clot or tumor. Consider cystoscopy further evaluation. No evidence of urolithiasis or hydronephrosis. Colonic diverticulosis, without radiographic evidence of diverticulitis. Large gastric diverticulum again noted. Electronically Signed   By: Norleen DELENA Graham CHRISTELLA.D.  On: 06/29/2024 12:20     Subjective: No acute issues or events overnight   Discharge Exam: Vitals:   07/23/24 0411 07/23/24 0839  BP: (!) 121/56  (!) (P) 147/79  Pulse: 69 (P) 80  Resp: 17 (P) 18  Temp: 98.2 F (36.8 C) (P) 97.6 F (36.4 C)  SpO2: 99% (P) 98%   Vitals:   07/22/24 1803 07/22/24 1939 07/23/24 0411 07/23/24 0839  BP: 123/68 119/69 (!) 121/56 (!) (P) 147/79  Pulse: 80 65 69 (P) 80  Resp: 19 18 17  (P) 18  Temp: 98.4 F (36.9 C) (!) 97.4 F (36.3 C) 98.2 F (36.8 C) (P) 97.6 F (36.4 C)  TempSrc:  Oral Oral (P) Oral  SpO2: 97% 100% 99% (P) 98%  Weight:      Height:        General: Pt is alert, awake, not in acute distress Cardiovascular: RRR, S1/S2 +, no rubs, no gallops Respiratory: CTA bilaterally, no wheezing, no rhonchi Abdominal: Soft, NT, ND, bowel sounds + Extremities: no edema, no cyanosis, right BKA    The results of significant diagnostics from this hospitalization (including imaging, microbiology, ancillary and laboratory) are listed below for reference.     Microbiology: No results found for this or any previous visit (from the past 240 hours).   Labs: BNP (last 3 results) No results for input(s): BNP in the last 8760 hours. Basic Metabolic Panel: Recent Labs  Lab 07/21/24 1159 07/21/24 1216 07/22/24 0335 07/23/24 0353  NA 141 142 143 142  K 4.2 4.1 3.8 3.9  CL 103 103 107 107  CO2 26  --  26 25  GLUCOSE 201* 192* 141* 168*  BUN 25* 25* 22 20  CREATININE 1.06* 1.30* 0.85 0.82  CALCIUM  9.3  --  8.6* 9.0   Liver Function Tests: Recent Labs  Lab 07/21/24 1159  AST 20  ALT 15  ALKPHOS 87  BILITOT 0.3  PROT 6.3*  ALBUMIN 3.7   Recent Labs  Lab 07/21/24 1159  LIPASE 19   No results for input(s): AMMONIA in the last 168 hours. CBC: Recent Labs  Lab 07/21/24 1159 07/21/24 1216 07/22/24 0335 07/23/24 0353  WBC 11.2*  --  9.9 8.6  NEUTROABS 8.5*  --   --  5.7  HGB 11.1* 11.2* 9.9* 10.5*  HCT 34.7* 33.0* 31.4* 32.5*  MCV 92.8  --  92.6 91.0  PLT 293  --  268 299   Cardiac Enzymes: No results for input(s): CKTOTAL, CKMB, CKMBINDEX, TROPONINI in the  last 168 hours. BNP: Invalid input(s): POCBNP CBG: Recent Labs  Lab 07/21/24 2025 07/22/24 1750 07/22/24 2136 07/23/24 0919  GLUCAP 178* 234* 150* 157*   D-Dimer No results for input(s): DDIMER in the last 72 hours. Hgb A1c No results for input(s): HGBA1C in the last 72 hours. Lipid Profile No results for input(s): CHOL, HDL, LDLCALC, TRIG, CHOLHDL, LDLDIRECT in the last 72 hours. Thyroid  function studies No results for input(s): TSH, T4TOTAL, T3FREE, THYROIDAB in the last 72 hours.  Invalid input(s): FREET3 Anemia work up No results for input(s): VITAMINB12, FOLATE, FERRITIN, TIBC, IRON, RETICCTPCT in the last 72 hours. Urinalysis    Component Value Date/Time   COLORURINE BROWN (A) 06/29/2024 1334   APPEARANCEUR TURBID (A) 06/29/2024 1334   LABSPEC 1.020 06/29/2024 1334   PHURINE 6.5 06/29/2024 1334   GLUCOSEU NEGATIVE 06/29/2024 1334   HGBUR LARGE (A) 06/29/2024 1334   BILIRUBINUR NEGATIVE 06/29/2024 1334   KETONESUR 15 (A) 06/29/2024 1334  PROTEINUR >300 (A) 06/29/2024 1334   NITRITE POSITIVE (A) 06/29/2024 1334   LEUKOCYTESUR MODERATE (A) 06/29/2024 1334   Sepsis Labs Recent Labs  Lab 07/21/24 1159 07/22/24 0335 07/23/24 0353  WBC 11.2* 9.9 8.6   Microbiology No results found for this or any previous visit (from the past 240 hours).   Time coordinating discharge: Over 30 minutes  SIGNED:   Elsie JAYSON Montclair, DO [facility] 07/23/2024, 3:53 PM Pager   If 7PM-7AM, please contact night-coverage [email or web address]     [1]  Allergies Allergen Reactions   Sulfa Antibiotics Nausea And Vomiting    Severe vomiting.

## 2024-07-23 NOTE — Telephone Encounter (Signed)
" ° °  Name: [name]  DOB: January 14, 1932  MRN: [id number]  Primary Cardiologist: Candyce Reek, MD  Chart reviewed as part of pre-operative protocol coverage. Because of [name]'s past medical history and time since last visit, she will require a follow-up in-office visit in order to better assess preoperative cardiovascular risk.  Pre-op covering staff: - Please schedule appointment and call patient to inform them. If patient already had an upcoming appointment within acceptable timeframe, please add pre-op clearance to the appointment notes so provider is aware. - Please contact requesting surgeon's office via preferred method (i.e, phone, fax) to inform them of need for appointment prior to surgery.   [name] Louder, PA-C  07/23/2024, 11:11 AM   "

## 2024-07-23 NOTE — TOC Transition Note (Signed)
 Transition of Care [facility]) - Discharge Note   Patient Details  Name: [name] MRN: [id number] Date of Birth: December 16, 1931  [facility]) CM/SW Contact:  Tom-Johnson, Kalik Hoare Daphne, RN Phone Number: 07/23/2024, 10:12 AM   Clinical Narrative:     Patient is scheduled for discharge today.  Readmission Risk Assessment done. Outpatient f/u, hospital f/u and discharge instructions on AVS. No ICM needs or recommendations noted. Son, Jodie to transport at discharge.  No further ICM needs noted.       Final next level of care: Home/Self Care Barriers to Discharge: Barriers Resolved   Patient Goals and CMS Choice Patient states their goals for this hospitalization and ongoing recovery are:: To return home CMS Medicare.gov Compare Post Acute Care list provided to:: Patient Choice offered to / list presented to : NA      Discharge Placement                Patient to be transferred to facility by: Son Name of family member notified: Jodie    Discharge Plan and Services Additional resources added to the After Visit Summary for                  DME Arranged: N/A DME Agency: NA       HH Arranged: NA HH Agency: NA        Social Drivers of Health (SDOH) Interventions SDOH Screenings   Food Insecurity: No Food Insecurity (07/22/2024)  Housing: Low Risk (07/22/2024)  Transportation Needs: No Transportation Needs (07/22/2024)  Utilities: Not At Risk (07/22/2024)  Social Connections: Patient Declined (07/22/2024)  Tobacco Use: Medium Risk (07/22/2024)     Readmission Risk Interventions    07/23/2024   10:08 AM  Readmission Risk Prevention Plan  Post Dischage Appt Complete  Medication Screening Complete  Transportation Screening Complete

## 2024-07-23 NOTE — Progress Notes (Addendum)
 Pt has an order to ambulation O2 testing. Pt refusing at this time. Pt did stand up a few times to get dressed and checked out saturations during this time. Pt remained at 94% and above. PT discharged today, education complete with pt, no questions for RN at this time. Pt has all belongings, Iv out and intact. Pt discharged home with son

## 2024-07-28 ENCOUNTER — Encounter: Payer: Self-pay | Admitting: Surgery

## 2024-07-28 ENCOUNTER — Encounter: Payer: Self-pay | Admitting: *Deleted

## 2024-07-29 ENCOUNTER — Ambulatory Visit: Attending: Emergency Medicine | Admitting: Emergency Medicine

## 2024-07-29 ENCOUNTER — Encounter: Payer: Self-pay | Admitting: Emergency Medicine

## 2024-07-29 VITALS — BP 120/54 | HR 80 | Ht 68.0 in | Wt 168.0 lb

## 2024-07-29 DIAGNOSIS — I5022 Chronic systolic (congestive) heart failure: Secondary | ICD-10-CM | POA: Diagnosis not present

## 2024-07-29 DIAGNOSIS — E785 Hyperlipidemia, unspecified: Secondary | ICD-10-CM | POA: Diagnosis not present

## 2024-07-29 DIAGNOSIS — I779 Disorder of arteries and arterioles, unspecified: Secondary | ICD-10-CM

## 2024-07-29 DIAGNOSIS — I10 Essential (primary) hypertension: Secondary | ICD-10-CM | POA: Diagnosis not present

## 2024-07-29 DIAGNOSIS — I25119 Atherosclerotic heart disease of native coronary artery with unspecified angina pectoris: Secondary | ICD-10-CM | POA: Diagnosis not present

## 2024-07-29 DIAGNOSIS — Z01810 Encounter for preprocedural cardiovascular examination: Secondary | ICD-10-CM | POA: Diagnosis not present

## 2024-07-29 DIAGNOSIS — I059 Rheumatic mitral valve disease, unspecified: Secondary | ICD-10-CM

## 2024-07-29 NOTE — Progress Notes (Signed)
 " Cardiology Office Note:    Date:  07/29/2024  ID:  [name], DOB 06-23-1932, MRN [id number] PCP: Loreli Elsie [name], MD  [facility] Providers Cardiologist:  Maude Emmer, MD       Patient Profile:       Chief Complaint: 1 year follow-up and preoperative clearance History of Present Illness:  [name] is a 89 y.o. female with visit-pertinent history of hypertension, hyperlipidemia, coronary artery disease, chronic diastolic heart failure, peripheral arterial disease, diabetes, mitral valve regurgitation and stenosis  History of CAD with DES to Cx in 07/2012 and PTCA/stent to OM2. CTO of RCA with collaterals was medically managed. EF at that time was estimated at 40-45%.   She has been seen by vascular surgery and is status post atherectomy and angioplasty of an 80% ostial left peroneal artery stenosis and drug-eluting balloon angioplasty of  tandem 60-70% left superficial femoral artery lesions.  This was performed on 04/16/2018 for a wound which has been treated by both Dr. Harden and podiatry.  This wound healed.  She developed a new wound and a repeat angiogram was performed on 09-03-2018.  Stented her left SFA and repeated PTA of her peroneal.   Admitted for new onset heart failure on 02/2020.  Echocardiogram 02/2020 showed LVEF 45 to 50%, global hypokinesis, mild LVH, RV function and size normal, moderate mitral valve regurgitation, moderate mitral stenosis, mild to moderate aortic valve sclerosis without evidence of aortic stenosis.  Followed up with Dr. Jackolyn on 05/03/2020.  Recent heart failure was discussed but it was clear that she was not interested in cardiac catheterization.  Plan was to continue aggressive secondary prevention and medically managed.  Last seen in clinic by Dr. Alveta on 08/24/2023.  She was doing well without chest discomfort.  Her EKG did show new right bundle branch block.  No changes were made.  She was to follow-up in 1 year.  She is now  pending cystoscopy with transurethral resection of bladder 08/13/2024 with [facility]   Discussed the use of AI scribe software for clinical note transcription with the patient, who gave verbal consent to proceed.  History of Present Illness [name] is a 89 year old female with coronary artery disease and mitral valve regurgitation who presents for cardiac clearance prior to bladder tumor surgery.  Today patient tells me she is doing well overall without acute cardiovascular concerns or complaints.  She has no chest pain or dyspnea. She develops fatigue after about 30 minutes of activity and needs brief rest, but she can cook, clean, and walk with a walker.  She has occasional dizziness when standing that resolves within 1 to 2 minutes.  She is currently using furosemide  sparingly.  She denies orthopnea, PND, Sanda.  No syncope, presyncope, lightheadedness, melena, hematochezia, palpitations   Review of systems:  Please see the history of present illness. All other systems are reviewed and otherwise negative.      Studies Reviewed:    EKG Interpretation Date/Time:  Tuesday July 29 2024 11:02:19 EST Ventricular Rate:  80 PR Interval:  202 QRS Duration:  160 QT Interval:  462 QTC Calculation: 532 R Axis:   266  Text Interpretation: Normal sinus rhythm Right bundle branch block When compared with ECG of 21-Jul-2024 12:09, PREVIOUS ECG IS PRESENT Confirmed by Rana Dixon [id number]) on 07/29/2024 12:24:13 PM    Echocardiogram 03/13/2020  1. Left ventricular ejection fraction, by estimation, is 45 to 50%. The  left ventricle has  mildly decreased function. The left ventricle  demonstrates global hypokinesis. There is mild concentric left ventricular  hypertrophy. Left ventricular diastolic  function could not be evaluated.   2. Right ventricular systolic function is normal. The right ventricular  size is normal. Tricuspid regurgitation signal is inadequate for assessing   PA pressure.   3. MVA by VTI 1.50 cm2. The mitral valve is degenerative. Moderate mitral  valve regurgitation. Moderate mitral stenosis. The mean mitral valve  gradient is 4.0 mmHg with average heart rate of 79 bpm.   4. The aortic valve is tricuspid. Aortic valve regurgitation is not  visualized. Mild to moderate aortic valve sclerosis/calcification is  present, without any evidence of aortic stenosis.   5. The inferior vena cava is dilated in size with >50% respiratory  variability, suggesting right atrial pressure of 8 mmHg.   Risk Assessment/Calculations:              Physical Exam:   VS:  BP (!) 120/54 (BP Location: Left Arm, Patient Position: Sitting, Cuff Size: Normal)   Pulse 80   Ht 5' 8 (1.727 m)   Wt 168 lb (76.2 kg)   BMI 25.54 kg/m    Wt Readings from Last 3 Encounters:  07/29/24 168 lb (76.2 kg)  07/21/24 175 lb (79.4 kg)  06/29/24 170 lb (77.1 kg)    GEN: Well nourished, well developed in no acute distress NECK: No JVD; No carotid bruits CARDIAC: RRR, no murmurs, rubs, gallops RESPIRATORY:  Clear to auscultation without rales, wheezing or rhonchi  ABDOMEN: Soft, non-tender, non-distended EXTREMITIES:  No edema     Assessment and Plan:  Coronary artery disease S/p DES to LCx in 2014 and PTCA/stent to OM2.  CTO of RCA with collaterals was medically managed - Today patient is stable without chest pains or exertional symptoms.  She is without any symptoms concerning for angina.  No indication further ischemic evaluation at this time - Continue aspirin  81 mg daily, atorvastatin  20 mg daily, carvedilol  12.5 mg twice daily, clopidogrel  75 mg daily, ezetimibe 10 mg daily  HFmrEF Admitted 02/2020 in acute diastolic heart failure Echocardiogram 02/2020 with LVEF 45 to 50% - Today she appears euvolemic and well compensated on exam - NYHA class II.  Without dyspnea, orthopnea, PND, and LE edema today - Continue carvedilol  12.5 mg twice daily, furosemide  40 mg daily  as needed, lisinopril  10 mg daily  Hypertension Blood pressure today is controlled at 120/54 - No changes to current antihypertensive regimen  Mitral valve regurgitation Mitral valve stenosis Echocardiogram 02/2020 showed moderate mitral valve regurgitation and moderate mitral valve stenosis - She remains asymptomatic today without chest discomfort, dyspnea on exertion, or syncope - She is not interested in routine monitoring which is reasonable given her age and comorbidities - No further interventions warranted at this time  Hyperlipidemia LDL 29 on 07/2023 and well-controlled - Continue atorvastatin  20 mg daily and ezetimibe 10 mg daily  Peripheral arterial disease s/p atherectomy and angioplasty of ostial left peroneal artery and DES of tandem left SFA lesions (04/2018). Left SFA stent and repeat PTA of peroneal in 08/2018 for a nonhealing wound.  Most recently angiogram with angioplasty of left SFA stent for recurrent in-stent restenosis S/p right BKA in 2021 - Managed by [facility]  Preoperative cardiovascular clearance According to the Revised Cardiac Risk Index (RCRI), her Perioperative Risk of Major Cardiac Event is (%): 11. Her Functional Capacity in METs is: 4.4 according to the Duke Activity Status Index (DASI).  Therefore,  based on ACC/AHA guidelines, patient would be at acceptable risk for the planned procedure without further cardiovascular testing. I will route this recommendation to the requesting party via Epic fax function.  She may hold Plavix  for 5 days prior to procedure. Please resume Plavix  as soon as possible postprocedure, at the discretion of the surgeon. Ideally aspirin  should be continued without interruption, however if the bleeding risk is too great, aspirin  may be held for 5-7 days prior to surgery. Please resume aspirin  post operatively when it is felt to be safe from a bleeding standpoint.         Dispo:  Return in about 1 year (around  07/29/2025).  Signed, Lum LITTIE Louis, NP  "

## 2024-07-29 NOTE — Patient Instructions (Addendum)
 Medication Instructions:  NO CHANGES  Lab Work: NONE TO BE DONE TODAY.  Testing/Procedures: NONE  Follow-Up: At [facility], you and your health needs are our priority.  As part of our continuing mission to provide you with exceptional heart care, our providers are all part of one team.  This team includes your primary Cardiologist (physician) and Advanced Practice Providers or APPs (Physician Assistants and Nurse Practitioners) who all work together to provide you with the care you need, when you need it.  Your next appointment:   1 YEAR  Provider:   Lum Louis, DNP    Other Instructions:

## 2024-08-06 NOTE — Progress Notes (Signed)
 COVID Vaccine received:  []  No [x]  Yes Date of any COVID positive Test in last 90 days: none  PCP - Elsie Loreli Raddle, MD  Cardiologist - Maude Emmer, MD, Lum Louis, NP cardiac clearance in 07-29-24 Epic note Vascular- Malvina New, MD   Chest x-ray - 07-21-2024  1v  EKG - 07-29-2024  Stress Test -  ECHO - 03-12-2020    Cardiac Cath - x 2 LHC / CORS (2013, 2014) by Dr. Dann   DES x1 to mid circumflex CT Coronary Calcium  score:   Pacemaker / ICD device [x]  No []  Yes   Spinal Cord Stimulator:[x]  No []  Yes       History of Sleep Apnea? [x]  No []  Yes   CPAP used?- [x]  No []  Yes    Medication on DOS: carvedilol  (COREG ), Pantoprazole  (PROTONIX ), Eye drops  Hold DOS: furosemide  (LASIX )  Paroxetine  (PAXIL ), Lisinopril  (ZESTRIL )- okay to take the night BEFORE your surgery.   Patient has: []  NO Hx DM   []  Pre-DM   []  DM1  [x]   DM2 Does the patient monitor blood sugar?   []  N/A   []  No []  Yes  Last A1c was: 6.9  on  06-04-2024    Does patient have a [employer] or Dexcom? []  No [x]  Yes  left upper arm Fasting Blood Sugar Ranges-  Checks Blood Sugar continuous times a day  Insulin  glargine (BASAGLAR ) - 30 units q hs Insulin  lispro (HUMALOG) SS 8-16 units  tid   Blood Thinner / Instructions:  clopidogrel  (PLAVIX ) hold x 5 days Aspirin  Instructions:  Aspirin  81 mg  hold x 5 days  Activity level: Able to walk up 2 flights of stairs without becoming significantly short of breath or having chest pain?   []    Yes   [x]  No,  would have: SOB no CP Patient can perform ADLs without assistance.  [x]   Yes    []  No   Patient was lucid and appropriate with  her answers to all my PST questions. Her son & POA, Oleta Gunnoe  was present and did not correct any of her answers. She has been taking all  her medications as directed with no lapses.    Anesthesia review: IDDM,  CAD, HTN, HFmrEF (NYHA class II),  Mitral regurg. / moderate stenosis, GERD, anemia, depression, PVD claudication,  poor  mobility d/t right BKA (2021), HOH-no HAs, PONV, some memory loss  Patient denies any S&S of respiratory illness or Covid - no shortness of breath, fever, cough or chest pain at PAT appointment.  Patient verbalized understanding and agreement to the Pre-Surgical Instructions that were given to them at this PAT appointment. Patient was also educated of the need to review these PAT instructions again prior to her surgery.I reviewed the appropriate phone numbers to call if they have any and questions or concerns.

## 2024-08-06 NOTE — Patient Instructions (Addendum)
 SURGICAL WAITING ROOM VISITATION Patients having surgery or a procedure may have no more than 2 support people in the waiting area - these visitors may rotate in the visitor waiting room.   If the patient needs to stay at the hospital during part of their recovery, the visitor guidelines for inpatient rooms apply.  PRE-OP VISITATION  Pre-op nurse will coordinate an appropriate time for 1 support person to accompany the patient in pre-op.  This support person may not rotate.  This visitor will be contacted when the time is appropriate for the visitor to come back in the pre-op area.  To keep our patients, visitors and teammates safe and prevent the spread of respiratory illnesses over the next few months.  Temporary Visitor Restrictions  Children ages 25 and under will not be able to visit patients in [facility] under most circumstances. Visitation is not restricted outside of hospitals unless noted otherwise in the [facility] and Location Specific Visitation Guidelines at:       [email or web address]  Visitors with respiratory illnesses are discouraged from visiting and should remain at home. You are not required to quarantine at this time prior to your surgery. However, you must do this: Hand Hygiene often Do NOT share personal items Notify your provider if you are in close contact with someone who has COVID or you develop fever 100.4 or greater, new onset of sneezing, cough, sore throat, shortness of breath or body aches.  If you test positive for Covid or have been in contact with anyone that has tested positive in the last 10 days please notify you surgeon.    Your procedure is scheduled on:  Wednesday  08-13-2024  Report to [facility] Main Entrance: Rana entrance where the [employer] is available.   Report to admitting at: 08:15    AM  Call this number if you have any questions or problems the morning of surgery [phone]  DO NOT EAT OR DRINK ANYTHING  AFTER MIDNIGHT THE NIGHT PRIOR TO YOUR SURGERY / PROCEDURE.   FOLLOW  ANY ADDITIONAL PRE OP INSTRUCTIONS YOU RECEIVED FROM YOUR SURGEON'S OFFICE!!!   Oral Hygiene is also important to reduce your risk of infection.        Remember - BRUSH YOUR TEETH THE MORNING OF SURGERY WITH YOUR REGULAR TOOTHPASTE  How to Manage Your Diabetes Before and After Surgery  Why is it important to control my blood sugar before and after surgery? Improving blood sugar levels before and after surgery helps healing and can limit problems. A way of improving blood sugar control is eating a healthy diet by:  Eating less sugar and carbohydrates  Increasing activity/exercise  Talking with your doctor about reaching your blood sugar goals High blood sugars (greater than 180 mg/dL) can raise your risk of infections and slow your recovery, so you will need to focus on controlling your diabetes during the weeks before surgery. Make sure that the doctor who takes care of your diabetes knows about your planned surgery including the date and location.  How do I manage my blood sugar before surgery? Check your blood sugar at least 4 times a day, starting 2 days before surgery, to make sure that the level is not too high or low. Check your blood sugar the morning of your surgery when you wake up and every 2 hours until you get to the Short Stay unit. If your blood sugar is less than 70 mg/dL, you will need to treat for low blood  sugar: Do not take insulin . Treat a low blood sugar (less than 70 mg/dL) with  cup of clear juice (cranberry or apple), 4 glucose tablets, OR glucose gel. Recheck blood sugar in 15 minutes after treatment (to make sure it is greater than 70 mg/dL). If your blood sugar is not greater than 70 mg/dL on recheck, call [phone] for further instructions. Report your blood sugar to the short stay nurse when you get to Short Stay.  If you are admitted to the hospital after surgery: Your blood sugar will  be checked by the staff and you will probably be given insulin  after surgery (instead of oral diabetes medicines) to make sure you have good blood sugar levels. The goal for blood sugar control after surgery is 80-180 mg/dL.   WHAT DO I DO ABOUT MY DIABETES MEDICATION?  Insulin  glargine (BASAGLAR ) - (30 units q hs) Day BEFORE surgery: take 50% dose (15 units) at bedtime.                      DAY OF SURGERY:  Insulin  lispro (HUMALOG) (SS 8-16 units  tid ) Day BEFORE surgery: take usual doses three times a day,  no evening/ bedtime dose.     DAY OF SURGERY: If your CBG is greater than 220 mg/dL, you may take  of your sliding scale (correction) dose of insulin .    IF you have any questions, call the nurse at [phone]   Do NOT smoke after Midnight the night before surgery.  clopidogrel  (PLAVIX ) Stop taking 120 hours before your surgery.  Last dose will be taken on THURSDAY  08-07-2024  Aspirin  81 mg - Stop taking 120 hours before your surgery.  Last dose will be taken on THURSDAY  08-07-2024  STOP TAKING all Vitamins, Herbs and supplements 1 week before your surgery.   Take ONLY these medicines the morning of surgery with A SIP OF WATER: carvedilol  (COREG ), Pantoprazole  (PROTONIX ), and you may use your Eye drops  DO NOT TAKE furosemide  (LASIX ) the morning of your surgery.   ++++ Paroxetine  (PAXIL ), Lisinopril  (ZESTRIL )- okay to take the night BEFORE your surgery.                   You may not have any metal on your body including hair pins, jewelry, and body piercing  Do not wear make-up, lotions, powders, perfumes or deodorant  Do not wear nail polish including gel and S&S, artificial / acrylic nails, or any other type of covering on natural nails including finger and toenails. If you have artificial nails, gel coating, etc., that needs to be removed by a nail salon, Please have this removed prior to surgery. Not doing so may mean that your surgery could be cancelled or delayed if the  Surgeon or anesthesia staff feels like they are unable to monitor you safely.   Do not shave 48 hours prior to surgery to avoid nicks in your skin which may contribute to postoperative infections.   Contacts, Hearing Aids, dentures or bridgework may not be worn into surgery. DENTURES WILL BE REMOVED PRIOR TO SURGERY PLEASE DO NOT APPLY Poly grip OR ADHESIVES!!!  Patients discharged on the day of surgery will not be allowed to drive home.  Someone NEEDS to stay with you for the first 24 hours after anesthesia.  Do not bring your home medications to the hospital. The Pharmacy will dispense medications listed on your medication list to you during your admission in the Hospital.  Please read over the following fact sheets you were given: IF YOU HAVE QUESTIONS ABOUT YOUR PRE-OP INSTRUCTIONS, PLEASE CALL [phone].   [place] - Preparing for Surgery         Before surgery, you can play an important role.  Because skin is not sterile, your skin needs to be as free of germs as possible.  You can reduce the number of germs on your skin by washing with CHG (chlorahexidine gluconate) soap before surgery.  CHG is an antiseptic cleaner which kills germs and bonds with the skin to continue killing germs even after washing. Please DO NOT use if you have an allergy to CHG or antibacterial soaps.  If your skin becomes reddened/irritated stop using the CHG and inform your nurse when you arrive at Short Stay. Do not shave (including legs and underarms) for at least 48 hours prior to the first CHG shower.  You may shave your face/neck.  Please follow these instructions carefully:  1.  Shower with CHG Soap the night before surgery ONLY (DO NOT USE THE CHG SOAP THE MORNING OF SURGERY).  2.  If you choose to wash your hair, wash your hair first as usual with your normal  shampoo.  3.  After you shampoo, rinse your hair and body thoroughly to remove the shampoo.                             4.  Use CHG as  you would any other liquid soap.  You can apply chg directly to the skin and wash.  Gently with a scrungie or clean washcloth.  5.  Apply the CHG Soap to your body ONLY FROM THE NECK DOWN.   Do not use on face/ open                           Wound or open sores. Avoid contact with eyes, ears mouth and genitals (private parts).                       Wash face,  Genitals (private parts) with your normal soap.             6.  Wash thoroughly, paying special attention to the area where your  surgery  will be performed.  7.  Thoroughly rinse your body with warm water from the neck down.  8.  DO NOT shower/wash with your normal soap after using and rinsing off the CHG Soap.                9.  Pat yourself dry with a clean towel.            10.  Wear clean pajamas.            11.  Place clean sheets on your bed the night of your first shower and do not  sleep with pets.  Day of Surgery : Do not apply any CHG, lotions/deodorants the morning of surgery.  Please wear clean clothes to the hospital/surgery center.   FAILURE TO FOLLOW THESE INSTRUCTIONS MAY RESULT IN THE CANCELLATION OF YOUR SURGERY  PATIENT SIGNATURE_________________________________  NURSE SIGNATURE__________________________________  ________________________________________________________________________

## 2024-08-07 ENCOUNTER — Encounter (HOSPITAL_COMMUNITY): Payer: Self-pay

## 2024-08-07 ENCOUNTER — Other Ambulatory Visit: Payer: Self-pay

## 2024-08-07 ENCOUNTER — Encounter (HOSPITAL_COMMUNITY)
Admission: RE | Admit: 2024-08-07 | Discharge: 2024-08-07 | Disposition: A | Discharge status: Home / Self Care | Source: Ambulatory Visit | Attending: Urology | Admitting: Urology

## 2024-08-07 VITALS — BP 107/58 | HR 65 | Temp 97.8°F | Resp 14 | Ht 68.0 in | Wt 168.0 lb

## 2024-08-07 DIAGNOSIS — E785 Hyperlipidemia, unspecified: Secondary | ICD-10-CM | POA: Diagnosis not present

## 2024-08-07 DIAGNOSIS — R310 Gross hematuria: Secondary | ICD-10-CM | POA: Diagnosis not present

## 2024-08-07 DIAGNOSIS — K219 Gastro-esophageal reflux disease without esophagitis: Secondary | ICD-10-CM | POA: Insufficient documentation

## 2024-08-07 DIAGNOSIS — I5022 Chronic systolic (congestive) heart failure: Secondary | ICD-10-CM | POA: Insufficient documentation

## 2024-08-07 DIAGNOSIS — Z794 Long term (current) use of insulin: Secondary | ICD-10-CM | POA: Diagnosis not present

## 2024-08-07 DIAGNOSIS — K5730 Diverticulosis of large intestine without perforation or abscess without bleeding: Secondary | ICD-10-CM | POA: Insufficient documentation

## 2024-08-07 DIAGNOSIS — E039 Hypothyroidism, unspecified: Secondary | ICD-10-CM | POA: Insufficient documentation

## 2024-08-07 DIAGNOSIS — I110 Hypertensive heart disease with heart failure: Secondary | ICD-10-CM | POA: Diagnosis not present

## 2024-08-07 DIAGNOSIS — F419 Anxiety disorder, unspecified: Secondary | ICD-10-CM | POA: Insufficient documentation

## 2024-08-07 DIAGNOSIS — Z01812 Encounter for preprocedural laboratory examination: Secondary | ICD-10-CM | POA: Diagnosis present

## 2024-08-07 DIAGNOSIS — Z89511 Acquired absence of right leg below knee: Secondary | ICD-10-CM | POA: Insufficient documentation

## 2024-08-07 DIAGNOSIS — Z01818 Encounter for other preprocedural examination: Secondary | ICD-10-CM

## 2024-08-07 DIAGNOSIS — Z955 Presence of coronary angioplasty implant and graft: Secondary | ICD-10-CM | POA: Insufficient documentation

## 2024-08-07 DIAGNOSIS — I2510 Atherosclerotic heart disease of native coronary artery without angina pectoris: Secondary | ICD-10-CM | POA: Insufficient documentation

## 2024-08-07 DIAGNOSIS — N3289 Other specified disorders of bladder: Secondary | ICD-10-CM | POA: Diagnosis not present

## 2024-08-07 DIAGNOSIS — I4510 Unspecified right bundle-branch block: Secondary | ICD-10-CM | POA: Diagnosis not present

## 2024-08-07 DIAGNOSIS — I080 Rheumatic disorders of both mitral and aortic valves: Secondary | ICD-10-CM | POA: Insufficient documentation

## 2024-08-07 DIAGNOSIS — E1151 Type 2 diabetes mellitus with diabetic peripheral angiopathy without gangrene: Secondary | ICD-10-CM | POA: Insufficient documentation

## 2024-08-07 DIAGNOSIS — K5732 Diverticulitis of large intestine without perforation or abscess without bleeding: Secondary | ICD-10-CM | POA: Insufficient documentation

## 2024-08-07 DIAGNOSIS — Z87891 Personal history of nicotine dependence: Secondary | ICD-10-CM | POA: Diagnosis not present

## 2024-08-07 DIAGNOSIS — I10 Essential (primary) hypertension: Secondary | ICD-10-CM | POA: Diagnosis not present

## 2024-08-07 HISTORY — DX: Unspecified osteoarthritis, unspecified site: M19.90

## 2024-08-07 HISTORY — DX: Inflammatory liver disease, unspecified: K75.9

## 2024-08-07 HISTORY — DX: Anemia, unspecified: D64.9

## 2024-08-07 LAB — CBC
HCT: 38.1 % (ref 36.0–46.0)
Hemoglobin: 11.4 g/dL — ABNORMAL LOW (ref 12.0–15.0)
MCH: 28.2 pg (ref 26.0–34.0)
MCHC: 29.9 g/dL — ABNORMAL LOW (ref 30.0–36.0)
MCV: 94.3 fL (ref 80.0–100.0)
Platelets: 343 K/uL (ref 150–400)
RBC: 4.04 MIL/uL (ref 3.87–5.11)
RDW: 13.6 % (ref 11.5–15.5)
WBC: 10.7 K/uL — ABNORMAL HIGH (ref 4.0–10.5)
nRBC: 0 % (ref 0.0–0.2)

## 2024-08-07 LAB — URINALYSIS, ROUTINE W REFLEX MICROSCOPIC
Bilirubin Urine: NEGATIVE
Glucose, UA: NEGATIVE mg/dL
Ketones, ur: NEGATIVE mg/dL
Nitrite: POSITIVE — AB
Protein, ur: >=300 mg/dL — AB
RBC / HPF: >50 RBC/hpf (ref 0–5)
Specific Gravity, Urine: 1.016 (ref 1.005–1.030)
WBC, UA: >50 WBC/hpf (ref 0–5)
pH: 6 (ref 5.0–8.0)

## 2024-08-07 LAB — BASIC METABOLIC PANEL WITH GFR
Anion gap: 13 (ref 5–15)
BUN: 24 mg/dL — ABNORMAL HIGH (ref 8–23)
CO2: 22 mmol/L (ref 22–32)
Calcium: 9.2 mg/dL (ref 8.9–10.3)
Chloride: 105 mmol/L (ref 98–111)
Creatinine, Ser: 1.02 mg/dL — ABNORMAL HIGH (ref 0.44–1.00)
GFR, Estimated: 52 mL/min — ABNORMAL LOW
Glucose, Bld: 142 mg/dL — ABNORMAL HIGH (ref 70–99)
Potassium: 4.1 mmol/L (ref 3.5–5.1)
Sodium: 140 mmol/L (ref 135–145)

## 2024-08-07 LAB — GLUCOSE, CAPILLARY: Glucose-Capillary: 138 mg/dL — ABNORMAL HIGH (ref 70–99)

## 2024-08-07 LAB — HEMOGLOBIN A1C
Hgb A1c MFr Bld: 7.2 % — ABNORMAL HIGH (ref 4.8–5.6)
Mean Plasma Glucose: 159.94 mg/dL

## 2024-08-08 ENCOUNTER — Other Ambulatory Visit (HOSPITAL_COMMUNITY): Payer: Self-pay | Admitting: Medical

## 2024-08-08 NOTE — Progress Notes (Signed)
 " Case: [id number] Date/Time: 08/13/24 1015   Procedure: TURBT (TRANSURETHRAL RESECTION OF BLADDER TUMOR) - CYSTOSCOPY WITH TURBT (TRANSURETHRAL RESECTION OF BLADDER TUMOR) AND BILATERAL RETROGRADE PYELOGRAM   Anesthesia type: General   Diagnosis: Gross hematuria [R31.0]   Pre-op diagnosis: GROSS HEMATURIA   Location: [facility] ROOM 03 / [facility]   Surgeons: Cam Morene ORN, MD       DISCUSSION: [name] is a 89 year old female with PMH of former smoking, HTN, CAD s/p PTCA to OM2 and DES to mid circumflex (2014), CHF EF 45-50%, moderate MR/MS, PVD s/p multiple interventions and s/p R BKA, GERD, IDDM (7.2), hypothyroid, anemia, anxiety, PONV  Patient had ED visit on 06/29/24 for gross hematuria. CT obtained showed bladder mass. Advised f/u with Urology.  Admitted from 1/5-07/23/24 for hypotension and diarrhea with AKI. Treated with IVF. Her BP med doses were decreased.   Patient follows with Vascular for hx of PVD. She is s/p multiple bilateral LE interventions. Underwent R BKA in 2021 due to osteomyelitis of the R foot. Last intervention was an angiogram with angioplasty of the left SFA stent for recurrent in stent restenosis. Last seen on 04/28/24 by PA Rhyne. Noted to be stable. Advised repeat imaging and f/u in 1 year.   Patient follows with Cardiology for hx of CAD with DES to Cx and PTCA to OM2 in 2014. CTO of RCA with collaterals was medically managed. EF at that time was estimated at 40-45%. Most recent echo in 2021 showed EF 45-50%, moderate MR/MS. Last seen in clinic on 07/29/24 by NP Fountain. She denied any cardiac symptoms. Activity wise: She develops fatigue after about 30 minutes of activity and needs brief rest, but she can cook, clean, and walk with a walker.  She has occasional dizziness when standing that resolves within 1 to 2 minutes.. She was advised to continue current medicines. Of note patient did not want any further surveillance of her valvular disease due to her age. Patient  was cleared for surgery:  Preoperative cardiovascular clearance According to the Revised Cardiac Risk Index (RCRI), her Perioperative Risk of Major Cardiac Event is (%): 11. Her Functional Capacity in METs is: 4.4 according to the [facility] Activity Status Index (DASI). Therefore, based on ACC/AHA guidelines, patient would be at acceptable risk for the planned procedure without further cardiovascular testing. I will route this recommendation to the requesting party via Epic fax function. She may hold Plavix  for 5 days prior to procedure. Please resume Plavix  as soon as possible postprocedure, at the discretion of the surgeon. Ideally aspirin  should be continued without interruption, however if the bleeding risk is too great, aspirin  may be held for 5-7 days prior to surgery. Please resume aspirin  post operatively when it is felt to be safe from a bleeding standpoint.  1/23: Discussed with Dr. Keneth. Try to obtain updated Echo due to moderate mitral valve disease in the setting of new RBBB since 08/2023. [facility] updated. Cardiology team updated.  VS: BP (!) 107/58 Comment: right arm sitting  Pulse 65   Temp 36.6 C (Oral)   Resp 14   Ht 5' 8 (1.727 m)   Wt 76.2 kg   SpO2 98%   BMI 25.54 kg/m   PROVIDERS: Loreli Elsie [name], MD   LABS: Labs reviewed: Acceptable for surgery. (all labs ordered are listed, but only abnormal results are displayed)  Labs Reviewed  URINE CULTURE - Abnormal; Notable for the following components:      Result Value  Culture 70,000 COLONIES/mL KLEBSIELLA PNEUMONIAE (*)    All other components within normal limits  CBC - Abnormal; Notable for the following components:   WBC 10.7 (*)    Hemoglobin 11.4 (*)    MCHC 29.9 (*)    All other components within normal limits  BASIC METABOLIC PANEL WITH GFR - Abnormal; Notable for the following components:   Glucose, Bld 142 (*)    BUN 24 (*)    Creatinine, Ser 1.02 (*)    GFR, Estimated 52 (*)    All other  components within normal limits  URINALYSIS, ROUTINE W REFLEX MICROSCOPIC - Abnormal; Notable for the following components:   APPearance CLOUDY (*)    Hgb urine dipstick LARGE (*)    Protein, ur >=300 (*)    Nitrite POSITIVE (*)    Leukocytes,Ua LARGE (*)    Bacteria, UA MANY (*)    Non Squamous Epithelial 6-10 (*)    All other components within normal limits  HEMOGLOBIN A1C - Abnormal; Notable for the following components:   Hgb A1c MFr Bld 7.2 (*)    All other components within normal limits  GLUCOSE, CAPILLARY - Abnormal; Notable for the following components:   Glucose-Capillary 138 (*)    All other components within normal limits     CT A/P 07/21/2024:  IMPRESSION: 1. Enhancing left-sided bladder mass, at the site of abnormality on 06/29/24 CT, most consistent with urothelial carcinoma. 2. Colon colonic diverticulosis with equivocal / mild sigmoid diverticulitis. 3. No evidence of metastatic disease. 4. New small bilateral pleural effusions and bibasilar atelectasis. 5. Proximal gastric wall thickening could represent gastritis or be artifactual in the setting of underdistention.  EKG 07/29/2024:  NSR RBBB   Echo 03/13/2020:  IMPRESSIONS    1. Left ventricular ejection fraction, by estimation, is 45 to 50%. The left ventricle has mildly decreased function. The left ventricle demonstrates global hypokinesis. There is mild concentric left ventricular hypertrophy. Left ventricular diastolic function could not be evaluated.  2. Right ventricular systolic function is normal. The right ventricular size is normal. Tricuspid regurgitation signal is inadequate for assessing PA pressure.  3. MVA by VTI 1.50 cm2. The mitral valve is degenerative. Moderate mitral valve regurgitation. Moderate mitral stenosis. The mean mitral valve gradient is 4.0 mmHg with average heart rate of 79 bpm.  4. The aortic valve is tricuspid. Aortic valve regurgitation is not visualized. Mild to  moderate aortic valve sclerosis/calcification is present, without any evidence of aortic stenosis.  5. The inferior vena cava is dilated in size with >50% respiratory variability, suggesting right atrial pressure of 8 mmHg.  Comparison(s): Prior images unable to be directly viewed, comparison made by report only. No significant change from prior study. 2014 EF 40-45% with inferior HK. Current study has EF 45-50% with global HK. Past Medical History:  Diagnosis Date   Anemia    Anxiety    Arthritis    Cellulitis 10/2015   LEFT FOOT   CHF (congestive heart failure) (HCC)    Complication of anesthesia    Coronary artery disease    Diabetes mellitus without complication (HCC)    insulin  dependent   GERD (gastroesophageal reflux disease)    Hepatitis    jaundice as a child   Hypertension    Hypothyroidism    Neuromuscular disorder (HCC)    muscle cramps to lower extremities   Other primary cardiomyopathies    Peripheral vascular disease    PONV (postoperative nausea and vomiting)    Shortness  of breath     Past Surgical History:  Procedure Laterality Date   ABDOMINAL AORTOGRAM N/A 09/03/2018   Procedure: ABDOMINAL AORTOGRAM;  Surgeon: Serene Gaile ORN, MD;  [facility];  Service: Cardiovascular;  Laterality: N/A;   ABDOMINAL AORTOGRAM W/LOWER EXTREMITY N/A 04/16/2018   Procedure: ABDOMINAL AORTOGRAM W/LOWER EXTREMITY;  Surgeon: Serene Gaile ORN, MD;  [facility]: [facility];  Service: Cardiovascular;  Laterality: N/A;  unilateral   ABDOMINAL AORTOGRAM W/LOWER EXTREMITY Bilateral 04/08/2020   Procedure: ABDOMINAL AORTOGRAM W/LOWER EXTREMITY;  Surgeon: Serene Gaile ORN, MD;  [facility];  Service: Cardiovascular;  Laterality: Bilateral;   ABDOMINAL AORTOGRAM W/LOWER EXTREMITY N/A 11/21/2022   Procedure: ABDOMINAL AORTOGRAM W/LOWER EXTREMITY;  Surgeon: Serene Gaile ORN, MD;  [facility];  Service: Cardiovascular;  Laterality: N/A;    ABDOMINAL HYSTERECTOMY     AMPUTATION Left 12/03/2018   Procedure: Left 3rd ray amputation;  Surgeon: Kit Rush, MD;  Location: [facility];  Service: Orthopedics;  Laterality: Left;    AMPUTATION Right 06/22/2020   Procedure: AMPUTATION BELOW KNEE;  Surgeon: Kit Rush, MD;  [facility];  Service: Orthopedics;  Laterality: Right;   CHOLECYSTECTOMY     CORONARY ANGIOPLASTY WITH STENT PLACEMENT  08/09/2011   DES  to mid circumflex   I & D EXTREMITY Left 10/29/2015   Procedure: Irrigation and Debridement Left Foot;  Surgeon: Jerona Harden GAILS, MD;  Location: [facility];  Service: Orthopedics;  Laterality: Left;   LEFT HEART CATHETERIZATION WITH CORONARY ANGIOGRAM N/A 08/08/2012   Procedure: LEFT HEART CATHETERIZATION WITH CORONARY ANGIOGRAM;  Surgeon: Candyce GORMAN Reek, MD;  Location: [facility] C[facility];  Service: Cardiovascular;  Laterality: N/A;   PERIPHERAL VASCULAR ATHERECTOMY  04/16/2018   Procedure: PERIPHERAL VASCULAR ATHERECTOMY;  Surgeon: Serene Gaile ORN, MD;  [facility];  Service: Cardiovascular;;  lt. Peroneal   PERIPHERAL VASCULAR ATHERECTOMY Right 04/08/2020   Procedure: PERIPHERAL VASCULAR ATHERECTOMY;  Surgeon: Serene Gaile ORN, MD;  [facility];  Service: Cardiovascular;  Laterality: Right;  SFA and Peroneal   PERIPHERAL VASCULAR BALLOON ANGIOPLASTY  04/16/2018   Procedure: PERIPHERAL VASCULAR BALLOON ANGIOPLASTY;  Surgeon: Serene Gaile ORN, MD;  [facility];  Service: Cardiovascular;;  lt. sfa and AT   PERIPHERAL VASCULAR BALLOON ANGIOPLASTY Left 09/03/2018   Procedure: PERIPHERAL VASCULAR BALLOON ANGIOPLASTY;  Surgeon: Serene Gaile ORN, MD;  [facility];  Service: Cardiovascular;  Laterality: Left;  TP TRUNK   PERIPHERAL VASCULAR BALLOON ANGIOPLASTY Right 04/08/2020   Procedure: PERIPHERAL VASCULAR BALLOON ANGIOPLASTY;  Surgeon: Serene Gaile ORN, MD;  [facility];  Service:  Cardiovascular;  Laterality: Right;  SFA (DCB), Peroneal   PERIPHERAL VASCULAR CATHETERIZATION N/A 12/30/2014   Procedure: Lower Extremity Angiography;  Surgeon: Deatrice DELENA Cage, MD;  [facility];  Service: Cardiovascular;  Laterality: N/A;   PERIPHERAL VASCULAR INTERVENTION Left 09/03/2018   Procedure: PERIPHERAL VASCULAR INTERVENTION;  Surgeon: Serene Gaile ORN, MD;  [facility];  Service: Cardiovascular;  Laterality: Left;  SFA STENT    PERIPHERAL VASCULAR INTERVENTION  11/21/2022   Procedure: PERIPHERAL VASCULAR INTERVENTION;  Surgeon: Serene Gaile ORN, MD;  [facility];  Service: Cardiovascular;;   TENDON RELEASE Right 03/11/2020   Procedure: Heel Cord Lengthening;  Surgeon: Kit Rush, MD;  Location: [facility];  Service: Orthopedics;  Laterality: Right;   THYROID  SURGERY  radioactive iodine     TONSILLECTOMY     TRANSMETATARSAL AMPUTATION Right 03/11/2020   Procedure: Right foot transmetatarsal amputation;  Surgeon: Kit Rush, MD;  Location: [facility];  Service: Orthopedics;  Laterality: Right;    MEDICATIONS:  aspirin  EC 81 MG tablet   atorvastatin  (LIPITOR) 20 MG tablet   bismuth  subsalicylate (PEPTO BISMOL) 262 MG/15ML suspension   carvedilol  (COREG ) 25 MG tablet   clopidogrel  (PLAVIX ) 75 MG tablet   dorzolamide -timolol  (COSOPT ) 2-0.5 % ophthalmic solution   ergocalciferol (VITAMIN D2) 1.25 MG (50000 UT) capsule   ezetimibe (ZETIA) 10 MG tablet   furosemide  (LASIX ) 40 MG tablet   Insulin  Glargine (BASAGLAR  KWIKPEN) 100 UNIT/ML   insulin  lispro (HUMALOG) 100 UNIT/ML injection   latanoprost  (XALATAN ) 0.005 % ophthalmic solution   lisinopril  (ZESTRIL ) 20 MG tablet   nitroGLYCERIN  (NITROSTAT ) 0.4 MG SL tablet   pantoprazole  (PROTONIX ) 40 MG tablet   PARoxetine  (PAXIL ) 40 MG tablet   No current facility-administered medications for this encounter.    Burnard CHRISTELLA Odis DEVONNA [facility] Short  Stay/Anesthesiology [facility] [place] Phone [phone] 08/08/2024 2:23 PM       "

## 2024-08-08 NOTE — Addendum Note (Signed)
 Addended by: Natan Hartog M on: 08/08/2024 03:57 PM   Modules accepted: Orders

## 2024-08-10 LAB — URINE CULTURE: Culture: 70000 — AB

## 2024-08-12 ENCOUNTER — Other Ambulatory Visit (HOSPITAL_BASED_OUTPATIENT_CLINIC_OR_DEPARTMENT_OTHER): Payer: Self-pay

## 2024-08-12 ENCOUNTER — Other Ambulatory Visit (INDEPENDENT_AMBULATORY_CARE_PROVIDER_SITE_OTHER)

## 2024-08-12 DIAGNOSIS — Z01818 Encounter for other preprocedural examination: Secondary | ICD-10-CM | POA: Diagnosis not present

## 2024-08-12 DIAGNOSIS — I25119 Atherosclerotic heart disease of native coronary artery with unspecified angina pectoris: Secondary | ICD-10-CM | POA: Diagnosis not present

## 2024-08-12 LAB — ECHOCARDIOGRAM COMPLETE
Area-P 1/2: 3.89 cm2
MV M vel: 5.15 m/s
MV Peak grad: 106.1 mmHg
MV VTI: 0.76 cm2
Radius: 0.7 cm
S' Lateral: 2.98 cm

## 2024-08-12 MED ORDER — PERFLUTREN LIPID MICROSPHERE
1.0000 mL | INTRAVENOUS | Status: AC | PRN
  Administered 2024-08-12: 1 mL via INTRAVENOUS

## 2024-08-12 MED ORDER — SULFAMETHOXAZOLE-TRIMETHOPRIM 800-160 MG PO TABS
1.0000 | ORAL_TABLET | Freq: Two times a day (BID) | ORAL | 0 refills | Status: DC
  Filled 2024-08-12 (×2): qty 10, 5d supply, fill #0

## 2024-08-12 NOTE — Anesthesia Preprocedure Evaluation (Signed)
"                                    Anesthesia Evaluation  Patient identified by MRN, date of birth, ID band Patient awake    Reviewed: Allergy & Precautions, H&P , NPO status , Patient's Chart, lab work & pertinent test results  History of Anesthesia Complications (+) PONV and history of anesthetic complications  Airway Mallampati: III  TM Distance: >3 FB Neck ROM: Full    Dental  (+) Teeth Intact, Caps   Pulmonary neg sleep apnea, former smoker   Pulmonary exam normal breath sounds clear to auscultation       Cardiovascular hypertension, + CAD, + Peripheral Vascular Disease and +CHF  Normal cardiovascular exam+ Valvular Problems/Murmurs MR  Rhythm:Regular Rate:Normal  TTE 1/27 IMPRESSIONS     1. Left ventricular ejection fraction, by estimation, is 30 to 35%. Left  ventricular ejection fraction by 3D volume is 32 %. The left ventricle has  moderately decreased function. The left ventricle demonstrates regional  wall motion abnormalities (see  scoring diagram/findings for description). There is mild left ventricular  hypertrophy. Left ventricular diastolic parameters are consistent with  Grade II diastolic dysfunction (pseudonormalization). Elevated left atrial  pressure.   2. Right ventricular systolic function is normal. The right ventricular  size is normal. Tricuspid regurgitation signal is inadequate for assessing  PA pressure.   3. The mitral valve is degenerative. Moderate mitral valve regurgitation.  Moderate mitral stenosis. The mean mitral valve gradient is 5.0 mmHg with  average heart rate of 83 bpm. Moderate mitral annular calcification.   4. The aortic valve is tricuspid. Aortic valve regurgitation is not  visualized. Aortic valve sclerosis/calcification is present, without any  evidence of aortic stenosis.   5. The inferior vena cava is normal in size with greater than 50%  respiratory variability, suggesting right atrial pressure of 3 mmHg.      Neuro/Psych neg Seizures PSYCHIATRIC DISORDERS Anxiety     negative neurological ROS     GI/Hepatic ,GERD  ,,(+) Hepatitis -  Endo/Other  diabetesHypothyroidism    Renal/GU Renal disease  negative genitourinary   Musculoskeletal  (+) Arthritis ,    Abdominal   Peds negative pediatric ROS (+)  Hematology  (+) Blood dyscrasia, anemia   Anesthesia Other Findings   Reproductive/Obstetrics negative OB ROS                              Anesthesia Physical Anesthesia Plan  ASA: 4  Anesthesia Plan: General   Post-op Pain Management:    Induction: Intravenous  PONV Risk Score and Plan: 4 or greater and Ondansetron , Dexamethasone  and Treatment may vary due to age or medical condition  Airway Management Planned: Oral ETT  Additional Equipment:   Intra-op Plan:   Post-operative Plan: Extubation in OR  Informed Consent: I have reviewed the patients History and Physical, chart, labs and discussed the procedure including the risks, benefits and alternatives for the proposed anesthesia with the patient or authorized representative who has indicated his/her understanding and acceptance.     Dental advisory given  Plan Discussed with:   Anesthesia Plan Comments: (See PAT note from 1/22. Echo updated)         Anesthesia Quick Evaluation  "

## 2024-08-13 ENCOUNTER — Encounter (HOSPITAL_COMMUNITY)
Admission: RE | Disposition: A | Discharge status: Home / Self Care | Payer: Self-pay | Source: Home / Self Care | Attending: Urology

## 2024-08-13 ENCOUNTER — Other Ambulatory Visit: Payer: Self-pay

## 2024-08-13 ENCOUNTER — Ambulatory Visit (HOSPITAL_COMMUNITY)

## 2024-08-13 ENCOUNTER — Ambulatory Visit (HOSPITAL_BASED_OUTPATIENT_CLINIC_OR_DEPARTMENT_OTHER): Payer: Self-pay | Admitting: Anesthesiology

## 2024-08-13 ENCOUNTER — Ambulatory Visit (HOSPITAL_COMMUNITY)
Admission: RE | Admit: 2024-08-13 | Discharge: 2024-08-13 | Disposition: A | Discharge status: Home / Self Care | Attending: Urology | Admitting: Urology

## 2024-08-13 ENCOUNTER — Encounter (HOSPITAL_COMMUNITY): Payer: Self-pay | Admitting: Medical

## 2024-08-13 ENCOUNTER — Encounter (HOSPITAL_COMMUNITY): Payer: Self-pay | Admitting: Urology

## 2024-08-13 DIAGNOSIS — I2510 Atherosclerotic heart disease of native coronary artery without angina pectoris: Secondary | ICD-10-CM | POA: Diagnosis not present

## 2024-08-13 DIAGNOSIS — Z87891 Personal history of nicotine dependence: Secondary | ICD-10-CM

## 2024-08-13 DIAGNOSIS — C679 Malignant neoplasm of bladder, unspecified: Secondary | ICD-10-CM | POA: Diagnosis not present

## 2024-08-13 DIAGNOSIS — E1151 Type 2 diabetes mellitus with diabetic peripheral angiopathy without gangrene: Secondary | ICD-10-CM | POA: Insufficient documentation

## 2024-08-13 DIAGNOSIS — M1990 Unspecified osteoarthritis, unspecified site: Secondary | ICD-10-CM | POA: Insufficient documentation

## 2024-08-13 DIAGNOSIS — K219 Gastro-esophageal reflux disease without esophagitis: Secondary | ICD-10-CM | POA: Diagnosis not present

## 2024-08-13 DIAGNOSIS — E039 Hypothyroidism, unspecified: Secondary | ICD-10-CM | POA: Insufficient documentation

## 2024-08-13 DIAGNOSIS — I509 Heart failure, unspecified: Secondary | ICD-10-CM | POA: Diagnosis not present

## 2024-08-13 DIAGNOSIS — E119 Type 2 diabetes mellitus without complications: Secondary | ICD-10-CM | POA: Insufficient documentation

## 2024-08-13 DIAGNOSIS — Z01818 Encounter for other preprocedural examination: Secondary | ICD-10-CM

## 2024-08-13 DIAGNOSIS — I110 Hypertensive heart disease with heart failure: Secondary | ICD-10-CM

## 2024-08-13 DIAGNOSIS — C678 Malignant neoplasm of overlapping sites of bladder: Secondary | ICD-10-CM | POA: Insufficient documentation

## 2024-08-13 DIAGNOSIS — R310 Gross hematuria: Secondary | ICD-10-CM | POA: Diagnosis present

## 2024-08-13 DIAGNOSIS — I5032 Chronic diastolic (congestive) heart failure: Secondary | ICD-10-CM

## 2024-08-13 LAB — GLUCOSE, CAPILLARY
Glucose-Capillary: 252 mg/dL — ABNORMAL HIGH (ref 70–99)
Glucose-Capillary: 272 mg/dL — ABNORMAL HIGH (ref 70–99)

## 2024-08-13 MED ORDER — FUROSEMIDE 10 MG/ML IJ SOLN
20.0000 mg | Freq: Once | INTRAMUSCULAR | Status: AC
  Administered 2024-08-13: 20 mg via INTRAVENOUS

## 2024-08-13 MED ORDER — INSULIN ASPART 100 UNIT/ML IJ SOLN
0.0000 [IU] | INTRAMUSCULAR | Status: DC | PRN
  Administered 2024-08-13: 3 [IU] via SUBCUTANEOUS
  Filled 2024-08-13: qty 3

## 2024-08-13 MED ORDER — DROPERIDOL 2.5 MG/ML IJ SOLN: 0.6250 mg | Freq: Once | INTRAMUSCULAR | Status: DC | PRN

## 2024-08-13 MED ORDER — PROPOFOL 10 MG/ML IV BOLUS
INTRAVENOUS | Status: AC
  Filled 2024-08-13: qty 20

## 2024-08-13 MED ORDER — PHENAZOPYRIDINE HCL 200 MG PO TABS: 200.0000 mg | ORAL_TABLET | Freq: Three times a day (TID) | ORAL | 0 refills | Status: AC | PRN

## 2024-08-13 MED ORDER — ROCURONIUM BROMIDE 10 MG/ML (PF) SYRINGE
PREFILLED_SYRINGE | INTRAVENOUS | Status: DC | PRN
  Administered 2024-08-13: 50 mg via INTRAVENOUS

## 2024-08-13 MED ORDER — FENTANYL CITRATE (PF) 100 MCG/2ML IJ SOLN
INTRAMUSCULAR | Status: DC | PRN
  Administered 2024-08-13: 25 ug via INTRAVENOUS
  Administered 2024-08-13 (×2): 50 ug via INTRAVENOUS

## 2024-08-13 MED ORDER — DEXMEDETOMIDINE HCL IN NACL 80 MCG/20ML IV SOLN
INTRAVENOUS | Status: DC | PRN
  Administered 2024-08-13 (×2): 4 ug via INTRAVENOUS

## 2024-08-13 MED ORDER — OXYCODONE HCL 5 MG/5ML PO SOLN: 5.0000 mg | Freq: Once | ORAL | Status: DC | PRN

## 2024-08-13 MED ORDER — METOCLOPRAMIDE HCL 5 MG/ML IJ SOLN: INTRAMUSCULAR | Status: DC | PRN

## 2024-08-13 MED ORDER — SODIUM CHLORIDE 0.9 % IV SOLN
80.0000 mg | Freq: Once | INTRAVENOUS | Status: AC
  Administered 2024-08-13: 80 mg
  Filled 2024-08-13: qty 80

## 2024-08-13 MED ORDER — ACETAMINOPHEN 10 MG/ML IV SOLN
INTRAVENOUS | Status: AC
  Filled 2024-08-13: qty 100

## 2024-08-13 MED ORDER — FENTANYL CITRATE (PF) 100 MCG/2ML IJ SOLN
INTRAMUSCULAR | Status: AC
  Filled 2024-08-13: qty 2

## 2024-08-13 MED ORDER — OXYCODONE HCL 5 MG PO TABS: 5.0000 mg | ORAL_TABLET | Freq: Once | ORAL | Status: DC | PRN

## 2024-08-13 MED ORDER — INSULIN ASPART 100 UNIT/ML IJ SOLN
INTRAMUSCULAR | Status: AC
  Filled 2024-08-13: qty 5

## 2024-08-13 MED ORDER — ESMOLOL HCL 100 MG/10ML IV SOLN
INTRAVENOUS | Status: AC
  Filled 2024-08-13: qty 10

## 2024-08-13 MED ORDER — METOPROLOL TARTRATE 5 MG/5ML IV SOLN
INTRAVENOUS | Status: DC | PRN
  Administered 2024-08-13: 2.5 mg via INTRAVENOUS

## 2024-08-13 MED ORDER — ONDANSETRON HCL 4 MG/2ML IJ SOLN
INTRAMUSCULAR | Status: DC | PRN
  Administered 2024-08-13: 4 mg via INTRAVENOUS

## 2024-08-13 MED ORDER — FUROSEMIDE 10 MG/ML IJ SOLN
INTRAMUSCULAR | Status: AC
  Filled 2024-08-13: qty 2

## 2024-08-13 MED ORDER — PHENYLEPHRINE 80 MCG/ML (10ML) SYRINGE FOR IV PUSH (FOR BLOOD PRESSURE SUPPORT)
PREFILLED_SYRINGE | INTRAVENOUS | Status: DC | PRN
  Administered 2024-08-13: 160 ug via INTRAVENOUS
  Administered 2024-08-13: 80 ug via INTRAVENOUS
  Administered 2024-08-13 (×2): 160 ug via INTRAVENOUS
  Administered 2024-08-13 (×2): 80 ug via INTRAVENOUS

## 2024-08-13 MED ORDER — TRANEXAMIC ACID-NACL 1000-0.7 MG/100ML-% IV SOLN
INTRAVENOUS | Status: AC
  Filled 2024-08-13: qty 100

## 2024-08-13 MED ORDER — TRAMADOL HCL 50 MG PO TABS: 50.0000 mg | ORAL_TABLET | Freq: Four times a day (QID) | ORAL | 0 refills | Status: AC | PRN

## 2024-08-13 MED ORDER — LACTATED RINGERS IV SOLN: INTRAVENOUS | Status: DC

## 2024-08-13 MED ORDER — CHLORHEXIDINE GLUCONATE 0.12 % MT SOLN
15.0000 mL | Freq: Once | OROMUCOSAL | Status: AC
  Administered 2024-08-13: 15 mL via OROMUCOSAL

## 2024-08-13 MED ORDER — FENTANYL CITRATE (PF) 50 MCG/ML IJ SOSY: 25.0000 ug | PREFILLED_SYRINGE | INTRAMUSCULAR | Status: DC | PRN

## 2024-08-13 MED ORDER — ACETAMINOPHEN 10 MG/ML IV SOLN
1000.0000 mg | Freq: Once | INTRAVENOUS | Status: DC | PRN
  Administered 2024-08-13: 1000 mg via INTRAVENOUS

## 2024-08-13 MED ORDER — DROPERIDOL 2.5 MG/ML IJ SOLN
INTRAMUSCULAR | Status: AC
  Filled 2024-08-13: qty 2

## 2024-08-13 MED ORDER — TRANEXAMIC ACID 1000 MG/10ML IV SOLN: 1000.0000 mg | Freq: Once | INTRAVENOUS | Status: DC

## 2024-08-13 MED ORDER — CEFAZOLIN SODIUM-DEXTROSE 2-4 GM/100ML-% IV SOLN
2.0000 g | INTRAVENOUS | Status: AC
  Administered 2024-08-13: 2 g via INTRAVENOUS
  Filled 2024-08-13: qty 100

## 2024-08-13 MED ORDER — INSULIN ASPART 100 UNIT/ML IJ SOLN
5.0000 [IU] | Freq: Once | INTRAMUSCULAR | Status: AC
  Administered 2024-08-13: 5 [IU] via SUBCUTANEOUS

## 2024-08-13 MED ORDER — TRANEXAMIC ACID-NACL 1000-0.7 MG/100ML-% IV SOLN
INTRAVENOUS | Status: DC | PRN
  Administered 2024-08-13: 1000 mg via INTRAVENOUS

## 2024-08-13 MED ORDER — SODIUM CHLORIDE 0.9 % IR SOLN
Status: DC | PRN
  Administered 2024-08-13: 12000 mL via INTRAVESICAL
  Administered 2024-08-13 (×3): 3000 mL via INTRAVESICAL

## 2024-08-13 MED ORDER — GENTAMICIN SULFATE 40 MG/ML IJ SOLN
5.0000 mg/kg | Freq: Once | INTRAVENOUS | Status: AC
  Administered 2024-08-13: 360 mg via INTRAVENOUS
  Filled 2024-08-13: qty 9

## 2024-08-13 MED ORDER — SUGAMMADEX SODIUM 200 MG/2ML IV SOLN
INTRAVENOUS | Status: DC | PRN
  Administered 2024-08-13: 175 mg via INTRAVENOUS

## 2024-08-13 MED ORDER — ONDANSETRON HCL 4 MG/2ML IJ SOLN
INTRAMUSCULAR | Status: AC
  Filled 2024-08-13: qty 2

## 2024-08-13 MED ORDER — ROCURONIUM BROMIDE 10 MG/ML (PF) SYRINGE
PREFILLED_SYRINGE | INTRAVENOUS | Status: AC
  Filled 2024-08-13: qty 10

## 2024-08-13 MED ORDER — ESMOLOL HCL 100 MG/10ML IV SOLN
INTRAVENOUS | Status: DC | PRN
  Administered 2024-08-13: 40 mg via INTRAVENOUS
  Administered 2024-08-13: 30 mg via INTRAVENOUS

## 2024-08-13 MED ORDER — DEXAMETHASONE SOD PHOSPHATE PF 10 MG/ML IJ SOLN
INTRAMUSCULAR | Status: DC | PRN
  Administered 2024-08-13: 10 mg via INTRAVENOUS

## 2024-08-13 MED ORDER — AMOXICILLIN-POT CLAVULANATE 875-125 MG PO TABS: 1.0000 | ORAL_TABLET | Freq: Two times a day (BID) | ORAL | 0 refills | Status: AC

## 2024-08-13 MED ORDER — TRANEXAMIC ACID-NACL 1000-0.7 MG/100ML-% IV SOLN
INTRAVENOUS | Status: AC
  Administered 2024-08-13: 1000 mg
  Filled 2024-08-13: qty 100

## 2024-08-13 MED ORDER — ORAL CARE MOUTH RINSE: 15.0000 mL | Freq: Once | OROMUCOSAL | Status: AC

## 2024-08-13 MED ORDER — DEXMEDETOMIDINE HCL IN NACL 80 MCG/20ML IV SOLN
INTRAVENOUS | Status: AC
  Filled 2024-08-13: qty 20

## 2024-08-13 MED ORDER — LIDOCAINE HCL (PF) 2 % IJ SOLN
INTRAMUSCULAR | Status: DC | PRN
  Administered 2024-08-13: 100 mg via INTRADERMAL

## 2024-08-13 MED ORDER — LIDOCAINE HCL (PF) 2 % IJ SOLN
INTRAMUSCULAR | Status: AC
  Filled 2024-08-13: qty 5

## 2024-08-13 MED ORDER — DEXAMETHASONE SOD PHOSPHATE PF 10 MG/ML IJ SOLN
INTRAMUSCULAR | Status: AC
  Filled 2024-08-13: qty 1

## 2024-08-13 MED ORDER — PROPOFOL 10 MG/ML IV BOLUS
INTRAVENOUS | Status: DC | PRN
  Administered 2024-08-13: 50 mg via INTRAVENOUS

## 2024-08-13 NOTE — Progress Notes (Signed)
 MD at bedside, Foley irriagated with no returm, Bladder scanned with 0 urine showing , MD states to get pt up and she can be discharged with or with out voiding

## 2024-08-13 NOTE — Anesthesia Procedure Notes (Signed)
 Procedure Name: Intubation Date/Time: 08/13/2024 11:05 AM  Performed by: Audry Pecina D, CRNAPre-anesthesia Checklist: Patient identified, Emergency Drugs available, Suction available and Patient being monitored Patient Re-evaluated:Patient Re-evaluated prior to induction Oxygen Delivery Method: Circle system utilized Preoxygenation: Pre-oxygenation with 100% oxygen Induction Type: IV induction Ventilation: Mask ventilation without difficulty Laryngoscope Size: Mac and 3 Grade View: Grade I Tube type: Oral Tube size: 7.0 mm Number of attempts: 1 Airway Equipment and Method: Stylet and Oral airway Placement Confirmation: ETT inserted through vocal cords under direct vision, positive ETCO2 and breath sounds checked- equal and bilateral Secured at: 21 cm Tube secured with: Tape Dental Injury: Teeth and Oropharynx as per pre-operative assessment

## 2024-08-13 NOTE — Transfer of Care (Signed)
 Immediate Anesthesia Transfer of Care Note  Patient: [name]  Procedure(s) Performed: TURBT (TRANSURETHRAL RESECTION OF BLADDER TUMOR) CYSTOSCOPY, WITH RETROGRADE PYELOGRAM AND URETERAL STENT INSERTION  Patient Location: PACU  Anesthesia Type:General  Level of Consciousness: awake, alert , and oriented  Airway & Oxygen Therapy: Patient Spontanous Breathing and Patient connected to face mask oxygen  Post-op Assessment: Report given to RN and Post -op Vital signs reviewed and stable  Post vital signs: Reviewed and stable  Last Vitals:  Vitals Value Taken Time  BP 115/63 08/13/24 13:00  Temp 37.2 C 08/13/24 12:53  Pulse 91 08/13/24 13:03  Resp 21 08/13/24 13:03  SpO2 96 % 08/13/24 13:03  Vitals shown include unfiled device data.  Last Pain:  Vitals:   08/13/24 1253  TempSrc:   PainSc: 0-No pain         Complications: No notable events documented.

## 2024-08-13 NOTE — Interval H&P Note (Signed)
 History and Physical Interval Note:  08/13/2024 10:36 AM  [name]  has presented today for surgery, with the diagnosis of GROSS HEMATURIA.  The various methods of treatment have been discussed with the patient and family. After consideration of risks, benefits and other options for treatment, the patient has consented to  Procedures with comments: TURBT (TRANSURETHRAL RESECTION OF BLADDER TUMOR) (N/A) - CYSTOSCOPY WITH TURBT (TRANSURETHRAL RESECTION OF BLADDER TUMOR) AND BILATERAL RETROGRADE PYELOGRAM as a surgical intervention.  The patient's history has been reviewed, patient examined, no change in status, stable for surgery.  I have reviewed the patient's chart and labs.  Questions were answered to the patient's satisfaction.     [name]

## 2024-08-13 NOTE — Anesthesia Postprocedure Evaluation (Signed)
"   Anesthesia Post Note  Patient: [name]  Procedure(s) Performed: TURBT (TRANSURETHRAL RESECTION OF BLADDER TUMOR) CYSTOSCOPY, WITH RETROGRADE PYELOGRAM AND URETERAL STENT INSERTION     Patient location during evaluation: PACU Anesthesia Type: General Level of consciousness: awake and alert Pain management: pain level controlled Vital Signs Assessment: post-procedure vital signs reviewed and stable Respiratory status: spontaneous breathing, nonlabored ventilation, respiratory function stable and patient connected to nasal cannula oxygen Cardiovascular status: blood pressure returned to baseline and stable Postop Assessment: no apparent nausea or vomiting Anesthetic complications: no   No notable events documented.  Last Vitals:  Vitals:   08/13/24 1530 08/13/24 1615  BP: (!) 112/59 (!) 112/58  Pulse: 91 89  Resp: 19 (!) 30  Temp:  (!) 36.1 C  SpO2: 91% 94%    Last Pain:  Vitals:   08/13/24 1615  TempSrc:   PainSc: 0-No pain                 Thom [name]      "

## 2024-08-13 NOTE — Op Note (Signed)
 Preoperative diagnosis:  Bladder cancer, left bladder wall  Postoperative diagnosis:  Bladder cancer, overlapping sites, 5 cm  Procedure: TURBT, 5 cm Bilateral retrograde pyelogram with interpretation Left ureteral stent placement  Surgeon: Morene MICAEL Salines, MD  Anesthesia: General  Complications: None  Intraoperative findings:  #1: The patient's primary tumor extended from the left lateral sidewall measured approximately 4 cm.  There were satellite lesions on the posterior bladder wall as well as on the left trigone.  The tumor in the left lateral wall did appear to extend into the left ureteral orifice.  I did not aggressively resect that area, but was not entirely sure whether it was removed completely.  Given the depth of the resection and concern for the left ureteral orifice stenosing I opted to leave a 24 cm Polaris (5 French) stent. #2: The patient's right retrograde pyelogram was performed with 10 cc of Omnipaque  contrast and demonstrated normal caliber ureter with no filling defects or other abnormalities. #3: The patient's left retrograde pyelogram was performed with a open-ended ureteral catheter and 10 cc Omnipaque  contrast and demonstrated normal caliber ureter with no filling defect or abnormality.  Fortunately there is no hydronephrosis. #4: A 5 French x 24 cm Polaris stent was placed in the left ureter   EBL: 50 cc  Specimens: Bladder tumor  Indication: [name] is a 89 y.o. patient with known bladder tumor.  After reviewing the management options for treatment, he elected to proceed with the above surgical procedure(s). We have discussed the potential benefits and risks of the procedure, side effects of the proposed treatment, the likelihood of the patient achieving the goals of the procedure, and any potential problems that might occur during the procedure or recuperation. Informed consent has been obtained.  Description of procedure:  Consent was obtained in  the preoperative holding area.  She was brought back to the operating room placed on the table in supine position.  General anesthesia was then induced and endotracheal tube was inserted.  She was placed in the dorsolithotomy position and prepped and draped in the routine sterile fashion.  A timeout was subsequently performed.  I started the case by irrigating the patient's bladder with approximately 350 cc of gentamicin  irrigation.  I then inserted the 30 degree 21 French cystoscope and was unable to cannulate the ureteral orifice ease.  The left ureteral orifice was overcome with tumor and the right ureteral orifice was difficult to visualize because of smaller tumor and a difficult angle.  As such, I opted not to perform a retrograde at this time and passed and the 26 French resectoscope sheath using the visual obturator and then swapped it out for the loop cautery element device.  I then systematically remove the patient's tumor in the left lateral wall as well as 1 in the posterior wall in the right trigone.  I have copiously fulgurated.  I did not counter without left ureteral orifice and it appeared that there was tumor emanating from the UO.  I resected this 2 separate times to ensure that all the tumor had been removed.  Fortunately there was no intramural fat or other signs of perforation.  I then resected the other satellite lesions.  I advanced a wire up through the left ureteral orifice and then exchanged that with a 5 French open-ended catheter performed retrograde pyelogram.  The above findings were then noted.  I then repassed the wire through the open-ended and advanced a 22 cm x 5 French Polaris ureteral  stent over the wire and into the left collecting system.  I then used a 5 French open-ended ureteral catheter and cannulated the patient's right ureteral orifice and performed a right retrograde pyelogram.  This was normal.  I then reevaluated the bladder to ensure that copious hemostasis had  been achieved.  I then emptied the patient's bladder and put in 20 to a Foley catheter to ensure that the bladder adequately drained while in the recovery room.  The patient was subsequently extubated and returned to PACU in stable condition.

## 2024-08-13 NOTE — Discharge Instructions (Signed)

## 2024-08-13 NOTE — Interval H&P Note (Signed)
 History and Physical Interval Note:  08/13/2024 10:39 AM  [name]  has presented today for surgery, with the diagnosis of GROSS HEMATURIA.  The various methods of treatment have been discussed with the patient and family. After consideration of risks, benefits and other options for treatment, the patient has consented to  Procedures with comments: TURBT (TRANSURETHRAL RESECTION OF BLADDER TUMOR) (N/A) - CYSTOSCOPY WITH TURBT (TRANSURETHRAL RESECTION OF BLADDER TUMOR) AND BILATERAL RETROGRADE PYELOGRAM as a surgical intervention.  The patient's history has been reviewed, patient examined, no change in status, stable for surgery.  I have reviewed the patient's chart and labs.  Questions were answered to the patient's satisfaction.     Morene LELON Salines

## 2024-08-14 ENCOUNTER — Encounter (HOSPITAL_COMMUNITY): Payer: Self-pay | Admitting: Urology

## 2024-08-14 LAB — SURGICAL PATHOLOGY

## 2024-08-15 ENCOUNTER — Ambulatory Visit: Payer: Self-pay | Admitting: Emergency Medicine

## 2024-08-17 DEATH — deceased
